# Patient Record
Sex: Female | Born: 1960 | Race: Black or African American | Hispanic: No
Health system: Southern US, Academic
[De-identification: ages and names within clinical notes are randomized; demographics above are authoritative.]

## PROBLEM LIST (undated history)

## (undated) ENCOUNTER — Encounter

## (undated) ENCOUNTER — Telehealth

## (undated) ENCOUNTER — Encounter: Attending: Nephrology | Primary: Nephrology

## (undated) ENCOUNTER — Ambulatory Visit

## (undated) ENCOUNTER — Ambulatory Visit: Attending: Women's Health | Primary: Women's Health

## (undated) ENCOUNTER — Encounter: Attending: Infectious Disease | Primary: Infectious Disease

## (undated) ENCOUNTER — Encounter: Attending: Internal Medicine | Primary: Internal Medicine

## (undated) ENCOUNTER — Ambulatory Visit: Payer: 59

## (undated) ENCOUNTER — Ambulatory Visit: Payer: 59 | Attending: Nephrology | Primary: Nephrology

## (undated) ENCOUNTER — Ambulatory Visit: Attending: Physician Assistant | Primary: Physician Assistant

## (undated) ENCOUNTER — Encounter
Payer: 59 | Attending: Pharmacist Clinician (PhC)/ Clinical Pharmacy Specialist | Primary: Pharmacist Clinician (PhC)/ Clinical Pharmacy Specialist

## (undated) ENCOUNTER — Encounter: Payer: 59 | Attending: Internal Medicine | Primary: Internal Medicine

## (undated) ENCOUNTER — Ambulatory Visit: Payer: 59 | Attending: Internal Medicine | Primary: Internal Medicine

## (undated) ENCOUNTER — Non-Acute Institutional Stay: Payer: 59

## (undated) ENCOUNTER — Telehealth: Attending: Internal Medicine | Primary: Internal Medicine

## (undated) ENCOUNTER — Ambulatory Visit: Attending: Family | Primary: Family

## (undated) ENCOUNTER — Encounter
Attending: Pharmacist Clinician (PhC)/ Clinical Pharmacy Specialist | Primary: Pharmacist Clinician (PhC)/ Clinical Pharmacy Specialist

## (undated) ENCOUNTER — Encounter: Attending: Physician Assistant | Primary: Physician Assistant

## (undated) ENCOUNTER — Encounter: Payer: 59 | Attending: Nephrology | Primary: Nephrology

## (undated) ENCOUNTER — Ambulatory Visit
Payer: 59 | Attending: Student in an Organized Health Care Education/Training Program | Primary: Student in an Organized Health Care Education/Training Program

## (undated) ENCOUNTER — Ambulatory Visit
Payer: MEDICARE | Attending: Student in an Organized Health Care Education/Training Program | Primary: Student in an Organized Health Care Education/Training Program

## (undated) ENCOUNTER — Ambulatory Visit: Attending: Adult Health | Primary: Adult Health

## (undated) DIAGNOSIS — I10 Essential (primary) hypertension: Secondary | ICD-10-CM

## (undated) DIAGNOSIS — K219 Gastro-esophageal reflux disease without esophagitis: Secondary | ICD-10-CM

## (undated) DIAGNOSIS — G43909 Migraine, unspecified, not intractable, without status migrainosus: Secondary | ICD-10-CM

## (undated) DIAGNOSIS — T7432XA Child psychological abuse, confirmed, initial encounter: Secondary | ICD-10-CM

## (undated) DIAGNOSIS — R112 Nausea with vomiting, unspecified: Secondary | ICD-10-CM

## (undated) DIAGNOSIS — Z9889 Other specified postprocedural states: Secondary | ICD-10-CM

## (undated) DIAGNOSIS — G4733 Obstructive sleep apnea (adult) (pediatric): Secondary | ICD-10-CM

## (undated) DIAGNOSIS — D631 Anemia in chronic kidney disease: Secondary | ICD-10-CM

## (undated) DIAGNOSIS — G479 Sleep disorder, unspecified: Secondary | ICD-10-CM

## (undated) DIAGNOSIS — B348 Other viral infections of unspecified site: Secondary | ICD-10-CM

## (undated) DIAGNOSIS — R490 Dysphonia: Secondary | ICD-10-CM

## (undated) DIAGNOSIS — I509 Heart failure, unspecified: Secondary | ICD-10-CM

## (undated) DIAGNOSIS — Z7960 Long term (current) use of unspecified immunomodulators and immunosuppressants: Secondary | ICD-10-CM

## (undated) DIAGNOSIS — J45909 Unspecified asthma, uncomplicated: Secondary | ICD-10-CM

## (undated) DIAGNOSIS — R600 Localized edema: Secondary | ICD-10-CM

## (undated) DIAGNOSIS — J449 Chronic obstructive pulmonary disease, unspecified: Secondary | ICD-10-CM

## (undated) DIAGNOSIS — Q613 Polycystic kidney, unspecified: Secondary | ICD-10-CM

## (undated) DIAGNOSIS — F419 Anxiety disorder, unspecified: Secondary | ICD-10-CM

## (undated) DIAGNOSIS — E119 Type 2 diabetes mellitus without complications: Secondary | ICD-10-CM

## (undated) DIAGNOSIS — N289 Disorder of kidney and ureter, unspecified: Secondary | ICD-10-CM

## (undated) DIAGNOSIS — I5030 Unspecified diastolic (congestive) heart failure: Secondary | ICD-10-CM

## (undated) DIAGNOSIS — B20 Human immunodeficiency virus [HIV] disease: Secondary | ICD-10-CM

## (undated) DIAGNOSIS — F1411 Cocaine abuse, in remission: Secondary | ICD-10-CM

## (undated) DIAGNOSIS — N186 End stage renal disease: Secondary | ICD-10-CM

## (undated) DIAGNOSIS — Z992 Dependence on renal dialysis: Secondary | ICD-10-CM

## (undated) DIAGNOSIS — K222 Esophageal obstruction: Secondary | ICD-10-CM

## (undated) DIAGNOSIS — Z62810 Personal history of physical and sexual abuse in childhood: Secondary | ICD-10-CM

## (undated) DIAGNOSIS — G629 Polyneuropathy, unspecified: Secondary | ICD-10-CM

## (undated) DIAGNOSIS — Z8616 Personal history of COVID-19: Secondary | ICD-10-CM

## (undated) DIAGNOSIS — G4730 Sleep apnea, unspecified: Secondary | ICD-10-CM

## (undated) DIAGNOSIS — J849 Interstitial pulmonary disease, unspecified: Secondary | ICD-10-CM

## (undated) DIAGNOSIS — F39 Unspecified mood [affective] disorder: Secondary | ICD-10-CM

## (undated) DIAGNOSIS — R7303 Prediabetes: Secondary | ICD-10-CM

## (undated) HISTORY — PX: APPENDECTOMY: SHX54

## (undated) HISTORY — PX: VEIN HARVEST: SHX6363

## (undated) HISTORY — PX: ABDOMINAL HYSTERECTOMY: SHX81

## (undated) HISTORY — PX: COLON SURGERY: SHX602

## (undated) HISTORY — PX: ABDOMINAL SURGERY: SHX537

## (undated) HISTORY — PX: ARTERIOVENOUS GRAFT PLACEMENT: SUR1029

## (undated) MED ORDER — MYCOPHENOLATE SODIUM 180 MG TABLET,DELAYED RELEASE: ORAL | 0 days

## (undated) MED ORDER — SODIUM BICARBONATE 325 MG TABLET: ORAL | 0 days

## (undated) MED ORDER — PREGABALIN 75 MG CAPSULE
ORAL | 0 days
Start: ? — End: 2020-04-08

## (undated) MED ORDER — TRAMADOL 50 MG TABLET: Freq: Four times a day (QID) | ORAL | 0.00000 days | PRN

---
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
## 2001-08-10 DIAGNOSIS — N186 End stage renal disease: Secondary | ICD-10-CM

## 2001-08-10 HISTORY — DX: Dependence on renal dialysis: N18.6

## 2013-09-21 ENCOUNTER — Ambulatory Visit: Payer: Self-pay | Admitting: Internal Medicine

## 2013-10-05 ENCOUNTER — Emergency Department: Payer: Self-pay | Admitting: Emergency Medicine

## 2013-11-19 ENCOUNTER — Emergency Department: Payer: Self-pay | Admitting: Emergency Medicine

## 2013-11-21 ENCOUNTER — Other Ambulatory Visit: Payer: Self-pay | Admitting: Nephrology

## 2013-11-21 LAB — POTASSIUM: POTASSIUM: 4.3 mmol/L (ref 3.5–5.1)

## 2013-11-28 ENCOUNTER — Ambulatory Visit: Payer: Self-pay | Admitting: Internal Medicine

## 2013-12-08 ENCOUNTER — Ambulatory Visit: Payer: Self-pay | Admitting: Anesthesiology

## 2013-12-09 ENCOUNTER — Emergency Department: Payer: Self-pay | Admitting: Emergency Medicine

## 2014-01-04 ENCOUNTER — Observation Stay: Payer: Self-pay | Admitting: Internal Medicine

## 2014-01-04 LAB — COMPREHENSIVE METABOLIC PANEL
ALBUMIN: 2.8 g/dL — AB (ref 3.4–5.0)
ALK PHOS: 78 U/L
Anion Gap: 3 — ABNORMAL LOW (ref 7–16)
BUN: 17 mg/dL (ref 7–18)
Bilirubin,Total: 0.2 mg/dL (ref 0.2–1.0)
CHLORIDE: 104 mmol/L (ref 98–107)
CO2: 31 mmol/L (ref 21–32)
CREATININE: 2.82 mg/dL — AB (ref 0.60–1.30)
Calcium, Total: 8.7 mg/dL (ref 8.5–10.1)
EGFR (Non-African Amer.): 18 — ABNORMAL LOW
GFR CALC AF AMER: 21 — AB
Glucose: 98 mg/dL (ref 65–99)
Osmolality: 277 (ref 275–301)
Potassium: 4.1 mmol/L (ref 3.5–5.1)
SGOT(AST): 29 U/L (ref 15–37)
SGPT (ALT): 16 U/L (ref 12–78)
Sodium: 138 mmol/L (ref 136–145)
Total Protein: 7.9 g/dL (ref 6.4–8.2)

## 2014-01-04 LAB — TROPONIN I
Troponin-I: <0.02 ng/mL
Troponin-I: <0.02 ng/mL
Troponin-I: <0.02 ng/mL

## 2014-01-04 LAB — CK TOTAL AND CKMB (NOT AT ARMC)
CK, TOTAL: 108 U/L
CK, Total: 113 U/L
CK, Total: 98 U/L
CK-MB: 0.6 ng/mL (ref 0.5–3.6)
CK-MB: <0.5 ng/mL — ABNORMAL LOW (ref 0.5–3.6)
CK-MB: 0.6 ng/mL (ref 0.5–3.6)

## 2014-01-04 LAB — CBC
HCT: 35 % (ref 35.0–47.0)
HGB: 11.3 g/dL — AB (ref 12.0–16.0)
MCH: 30.4 pg (ref 26.0–34.0)
MCHC: 32.3 g/dL (ref 32.0–36.0)
MCV: 94 fL (ref 80–100)
Platelet: 216 10*3/uL (ref 150–440)
RBC: 3.72 10*6/uL — AB (ref 3.80–5.20)
RDW: 16.6 % — AB (ref 11.5–14.5)
WBC: 8.7 10*3/uL (ref 3.6–11.0)

## 2014-01-05 DIAGNOSIS — I209 Angina pectoris, unspecified: Secondary | ICD-10-CM

## 2014-01-05 LAB — BASIC METABOLIC PANEL
ANION GAP: 4 — AB (ref 7–16)
BUN: 31 mg/dL — ABNORMAL HIGH (ref 7–18)
CO2: 27 mmol/L (ref 21–32)
Calcium, Total: 8.8 mg/dL (ref 8.5–10.1)
Chloride: 107 mmol/L (ref 98–107)
Creatinine: 4.13 mg/dL — ABNORMAL HIGH (ref 0.60–1.30)
GFR CALC AF AMER: 13 — AB
GFR CALC NON AF AMER: 11 — AB
Glucose: 96 mg/dL (ref 65–99)
OSMOLALITY: 282 (ref 275–301)
Potassium: 4.4 mmol/L (ref 3.5–5.1)
Sodium: 138 mmol/L (ref 136–145)

## 2014-03-08 ENCOUNTER — Ambulatory Visit: Payer: Self-pay | Admitting: Family Medicine

## 2014-06-18 ENCOUNTER — Emergency Department: Payer: Self-pay | Admitting: Emergency Medicine

## 2014-06-20 DIAGNOSIS — S82891A Other fracture of right lower leg, initial encounter for closed fracture: Secondary | ICD-10-CM | POA: Insufficient documentation

## 2014-06-20 DIAGNOSIS — S82892A Other fracture of left lower leg, initial encounter for closed fracture: Secondary | ICD-10-CM

## 2014-07-24 DIAGNOSIS — S82892D Other fracture of left lower leg, subsequent encounter for closed fracture with routine healing: Secondary | ICD-10-CM

## 2014-07-24 DIAGNOSIS — S82891D Other fracture of right lower leg, subsequent encounter for closed fracture with routine healing: Secondary | ICD-10-CM | POA: Insufficient documentation

## 2014-09-18 ENCOUNTER — Ambulatory Visit: Payer: Self-pay | Admitting: Internal Medicine

## 2014-12-01 NOTE — H&P (Signed)
PATIENT NAME:  Jasmine Holloway, Jasmine Holloway MR#:  [id number] DATE OF BIRTH:  May 25, 1961  DATE OF ADMISSION:  01/04/2014  ADMITTING PHYSICIAN: Gladstone Lighter, MD  PRIMARY PHYSICIAN: [facility].   CHIEF COMPLAINT: Chest pain.   HISTORY OF PRESENT ILLNESS: Jasmine Holloway is a 54 year old African-American female with past medical history significant for hypertension, hyperlipidemia, end-stage renal disease, on hemodialysis, who presents to the hospital secondary to chest pain that started during dialysis today. The patient said she was diagnosed with a rare form of polycystic kidney disease and has been on dialysis for 4 years now. She has not had prior chest pains. However, since her last 3 dialysis sessions, midway through dialysis, she experiences a heavy mid sternal pain associated with nausea. The patient states she had echocardiogram done about a couple of years ago while evaluating her for renal transplant, and at the time, she remembers that she could have had cardiomyopathy, but not sure if a stress test was done or not. She thinks she might have had a nuclear stress test which was negative at the same time as well. However, has not had chest pain at the time. She walks at home, she climbs stairs and does exertional activity without any recurrence of the pain. The pain usually starts midway through dialysis, starts with some nausea and feels like something catching in the throat and in the mid sternal region, associated with heavy pain. Denies any diaphoresis, radiation of the pain anywhere. She got a dose of nitro during dialysis for her chest pain, which did not improve the pain, and she got a second dose of nitro en route to the hospital, which relieved her pain. Does not have any prior cardiac history and no significant family history of heart problems noted. Her troponins are pending at this time.   PAST MEDICAL HISTORY:  1. End-stage renal disease, on dialysis for 4 years now, currently on  hemodialysis.  2. History of peritonitis infections in the past when she had peritoneal dialysis catheter.  3. Arthritis in the knees.  4. Chronic left hand pain from multiple graft surgeries.   PAST SURGICAL HISTORY:  1. Peritoneal ruptured abscess repair by laparotomy and extensive exploration.  2. Adhesiolysis twice later.  3. Peritoneal dialysis catheter placement.  4. Temporary dialysis catheter placement.  5. Multiple grafts for left arm and surgeries required.  6. Currently functioning graft on the right arm.   ALLERGIES TO MEDICATIONS: No known drug allergies.   CURRENT MEDICATIONS: The patient does not have a list of her medicines, but she can remember. As far as she can remember, she told me she takes the following: 1. A phosphate binder with meals.  2. Nephro-Vite 1 tablet p.o. daily.  3. Vitamin D2.  4. Crestor.  5. Omeprazole.  6. Percocet 10/325 mg 1 tablet q.8 hours p.r.n. for chronic left arm pain.   SOCIAL HISTORY: Used to live in [place]. Recently moved down to be with family to [place]. Currently lives by herself, has a fiance. No smoking or alcohol use.   FAMILY HISTORY: Dad lived up to late 59s and died of natural causes. Mom died in late 49s as well. She had COPD and CHF.   REVIEW OF SYSTEMS:  CONSTITUTIONAL: No fever, fatigue or weakness.  EYES: Positive for blurry vision. No inflammation or glaucoma.  ENT: No tinnitus, ear pain, hearing loss, epistaxis or discharge.  RESPIRATORY: No cough, wheezing, hemoptysis or COPD.  CARDIOVASCULAR: Positive for chest pain. No orthopnea, edema, arrhythmia, palpitations or  syncope.  GASTROINTESTINAL: Positive for nausea. No vomiting, diarrhea, abdominal pain, hematemesis or melena.  GENITOURINARY: Continues to make urine. No dysuria, hematuria, renal calculus or frequency.  ENDOCRINE: No polyuria, nocturia, thyroid problems, heat or cold intolerance.  HEMATOLOGY: No anemia, easy bruising or bleeding.  SKIN: No  acne, rash or lesions.  MUSCULOSKELETAL: Positive for arthritis, especially in the knees. No neck, back or shoulder pain.  NEUROLOGICAL: No numbness, weakness, CVA, TIA or seizures. Positive for headaches.  PSYCHOLOGICAL: No anxiety, insomnia or depression.   PHYSICAL EXAMINATION:  VITAL SIGNS: Temperature 98.5 degrees Fahrenheit, pulse 77, respirations 18, blood pressure 126/87, pulse oximetry 99% on room air.  GENERAL: Well-built, well-nourished female lying in bed, not in any acute distress.  HEENT: Normocephalic, atraumatic. Pupils equal, round and reacting to light. Anicteric sclerae. Extraocular movements intact. Oropharynx clear, without erythema, mass or exudates.  NECK: Supple. No thyromegaly, JVD or carotid bruits. No lymphadenopathy.  LUNGS: Moving air bilaterally. No wheeze or crackles. No use of accessory muscles for breathing.  CARDIOVASCULAR: S1, S2, regular rate and rhythm. No murmurs, rubs or gallops.  ABDOMEN: Soft, nontender, nondistended. No hepatosplenomegaly. Normal bowel sounds.  EXTREMITIES: No pedal edema. No clubbing or cyanosis, 2+ dorsalis pedis pulses palpable bilaterally.  SKIN: No acne, rash or lesions.  LYMPHATICS: No cervical lymphadenopathy.  NEUROLOGIC: Cranial nerves intact. No focal motor or sensory deficits.  PSYCHOLOGICAL: The patient is awake, alert, oriented x3.   LABORATORY DATA:  WBC 8.7, hemoglobin 11.3, hematocrit 35.0, platelet count 216.  Sodium 138, potassium 4.1, chloride 104, bicarbonate 31, BUN 17, creatinine 2.82, blood glucose of 98 and calcium of 8.7.  ALT 16, AST 29, alkaline phosphatase 98, total bilirubin 0.2 and albumin of 2.8.  Troponin less than 0.02.  Chest x-ray revealing mild interstitial edema. Heart is stable, mildly enlarged.  EKG showing normal sinus rhythm. No acute ST-T wave abnormalities.   ASSESSMENT AND PLAN: This is a 54 year old female with end-stage renal disease, on hemodialysis, brought in for chest pain.   1.  The chest pain during dialysis could be angina versus esophageal spasm with reflux problems. Will admit under observation. Continue to monitor troponins. Monitor on telemetry. Will order a stress test for tomorrow morning. Continue aspirin, statin and nitroglycerin p.r.n.  2. End-stage renal disease, on hemodialysis. Nephrology has been consulted. The patient on Tuesday, Thursday, Saturday schedule. She finished half of her dialysis today. She is not fluid overloaded for now.  3. Hyperlipidemia. Continue Crestor.  4. Chronic left arm pain from prior multiple graft surgeries. Continue Percocet.  5. Gastroesophageal reflux disease. The patient is on Protonix now.   CODE STATUS: Full code.   TIME SPENT ON ADMISSION: 50 minutes.   ____________________________ Gladstone Lighter, MD rk:lb D: 01/04/2014 13:00:00 ET T: 01/04/2014 13:39:29 ET JOB#: [id number]  cc: Gladstone Lighter, MD, <Dictator> Murlean Iba, MD Gladstone Lighter MD ELECTRONICALLY SIGNED 01/05/2014 12:35

## 2014-12-01 NOTE — Discharge Summary (Signed)
PATIENT NAME:  Jasmine Holloway, Jasmine Holloway MR#:  [id number] DATE OF BIRTH:  1961-02-16  DATE OF ADMISSION:  01/04/2014 DATE OF DISCHARGE:  01/05/2014  ADMISSION DIAGNOSIS:  Chest pain.   DISCHARGE DIAGNOSES:  1.  Chest pain. 2.  End-stage renal disease, on hemodialysis.   CONSULTATIONS:  Nephrology.   PERTINENT LABORATORY DATA:  Nuclear medicine scan is negative, showed a low risk scan.   HOSPITAL COURSE:  A 54 year old female who presented with chest pain during dialysis.  For further details, please refer to H and P.  1.  Chest pain, likely this is secondary to reflux.  It does not seem to be cardiac in nature.  Her troponins x 3 were negative.  Telemetry was essentially normal.  She underwent a Myoview stress test which was essentially negative.   2.  End-stage renal disease, on hemodialysis.  The patient with Tuesday, Thursday, Saturday schedule.  3.  Hyperlipidemia, on Crestor.  4.  Chronic left arm pain for multiple grafts.  The patient is on Percocet as needed.  5.  GERD.  The patient will continue on Protonix.   DISCHARGE MEDICATIONS: 1.  Gabapentin 300 mg daily.  2.  Percocet 5/325 q. 4 hours as needed.  3.  Gabapentin300 at bedtime.  4.  Lopressor 5 mg at bedtime.  5.  Nephro-Vite 1 tablet daily.  6.  Vitamin D2, 2 tablets daily.  7.  Calcium 667 mg 3 tablets three times a day.  8.  Aspirin 81 mg daily.   DISCHARGE DIET:  Low sodium.   DISCHARGE ACTIVITY:  As tolerated.   DISCHARGE FOLLOW-UP:  The patient will follow up with [facility] in one week and resume her hemodialysis tomorrow.    TIME SPENT:  35 minutes.  The patient is stable dor discharge.  ____________________________ Emaree Chiu P. Benjie Karvonen, MD spm:ea D: 01/05/2014 13:51:43 ET T: 01/05/2014 23:00:32 ET JOB#: [id number]  cc: Anely Spiewak P. Benjie Karvonen, MD, <Dictator> [place] P Brieonna Crutcher MD ELECTRONICALLY SIGNED 01/06/2014 7:53

## 2015-01-22 DIAGNOSIS — Z0289 Encounter for other administrative examinations: Secondary | ICD-10-CM | POA: Insufficient documentation

## 2015-02-23 ENCOUNTER — Observation Stay
Admission: EM | Admit: 2015-02-23 | Discharge: 2015-02-24 | Disposition: A | Discharge status: Home / Self Care | Payer: Medicare Other | Attending: Specialist | Admitting: Specialist

## 2015-02-23 DIAGNOSIS — I120 Hypertensive chronic kidney disease with stage 5 chronic kidney disease or end stage renal disease: Secondary | ICD-10-CM | POA: Insufficient documentation

## 2015-02-23 DIAGNOSIS — Z886 Allergy status to analgesic agent status: Secondary | ICD-10-CM | POA: Diagnosis not present

## 2015-02-23 DIAGNOSIS — M79602 Pain in left arm: Secondary | ICD-10-CM | POA: Diagnosis not present

## 2015-02-23 DIAGNOSIS — Z885 Allergy status to narcotic agent status: Secondary | ICD-10-CM | POA: Diagnosis not present

## 2015-02-23 DIAGNOSIS — Z8249 Family history of ischemic heart disease and other diseases of the circulatory system: Secondary | ICD-10-CM | POA: Insufficient documentation

## 2015-02-23 DIAGNOSIS — Z87891 Personal history of nicotine dependence: Secondary | ICD-10-CM | POA: Diagnosis not present

## 2015-02-23 DIAGNOSIS — R079 Chest pain, unspecified: Secondary | ICD-10-CM | POA: Diagnosis present

## 2015-02-23 DIAGNOSIS — Q613 Polycystic kidney, unspecified: Secondary | ICD-10-CM | POA: Diagnosis not present

## 2015-02-23 DIAGNOSIS — E785 Hyperlipidemia, unspecified: Secondary | ICD-10-CM | POA: Diagnosis not present

## 2015-02-23 DIAGNOSIS — N186 End stage renal disease: Secondary | ICD-10-CM | POA: Diagnosis not present

## 2015-02-23 DIAGNOSIS — R0789 Other chest pain: Secondary | ICD-10-CM | POA: Diagnosis not present

## 2015-02-23 DIAGNOSIS — Z992 Dependence on renal dialysis: Secondary | ICD-10-CM | POA: Insufficient documentation

## 2015-02-23 DIAGNOSIS — Z79899 Other long term (current) drug therapy: Secondary | ICD-10-CM | POA: Diagnosis not present

## 2015-02-23 DIAGNOSIS — N2581 Secondary hyperparathyroidism of renal origin: Secondary | ICD-10-CM | POA: Insufficient documentation

## 2015-02-23 HISTORY — DX: End stage renal disease: Z99.2

## 2015-02-23 HISTORY — DX: Polycystic kidney, unspecified: Q61.3

## 2015-02-23 HISTORY — DX: Essential (primary) hypertension: I10

## 2015-02-23 HISTORY — DX: End stage renal disease: N18.6

## 2015-02-24 ENCOUNTER — Encounter: Payer: Self-pay | Admitting: Emergency Medicine

## 2015-02-24 ENCOUNTER — Emergency Department: Payer: Medicare Other

## 2015-02-24 DIAGNOSIS — R079 Chest pain, unspecified: Secondary | ICD-10-CM | POA: Diagnosis present

## 2015-02-24 DIAGNOSIS — R0789 Other chest pain: Secondary | ICD-10-CM | POA: Diagnosis not present

## 2015-02-24 LAB — BASIC METABOLIC PANEL
Anion gap: 9 (ref 5–15)
BUN: 38 mg/dL — ABNORMAL HIGH (ref 6–20)
CHLORIDE: 105 mmol/L (ref 101–111)
CO2: 27 mmol/L (ref 22–32)
Calcium: 7.7 mg/dL — ABNORMAL LOW (ref 8.9–10.3)
Creatinine, Ser: 5.55 mg/dL — ABNORMAL HIGH (ref 0.44–1.00)
GFR calc Af Amer: 9 mL/min — ABNORMAL LOW (ref >60)
GFR calc non Af Amer: 8 mL/min — ABNORMAL LOW (ref >60)
Glucose, Bld: 103 mg/dL — ABNORMAL HIGH (ref 65–99)
POTASSIUM: 4 mmol/L (ref 3.5–5.1)
SODIUM: 141 mmol/L (ref 135–145)

## 2015-02-24 LAB — TROPONIN I
Troponin I: <0.03 ng/mL (ref <0.031)
Troponin I: <0.03 ng/mL (ref <0.031)

## 2015-02-24 LAB — CBC
HCT: 31.4 % — ABNORMAL LOW (ref 35.0–47.0)
Hemoglobin: 10.4 g/dL — ABNORMAL LOW (ref 12.0–16.0)
MCH: 30.8 pg (ref 26.0–34.0)
MCHC: 33.1 g/dL (ref 32.0–36.0)
MCV: 93.2 fL (ref 80.0–100.0)
PLATELETS: 206 10*3/uL (ref 150–440)
RBC: 3.37 MIL/uL — ABNORMAL LOW (ref 3.80–5.20)
RDW: 16.4 % — AB (ref 11.5–14.5)
WBC: 7.4 10*3/uL (ref 3.6–11.0)

## 2015-02-24 LAB — HEMOGLOBIN A1C: Hgb A1c MFr Bld: 5.3 % (ref 4.0–6.0)

## 2015-02-24 LAB — TSH: TSH: 1.764 u[IU]/mL (ref 0.350–4.500)

## 2015-02-24 MED ORDER — CALCIUM ACETATE (PHOS BINDER) 667 MG PO CAPS
667.0000 mg | ORAL_CAPSULE | Freq: Three times a day (TID) | ORAL | Status: DC
  Administered 2015-02-24 (×2): 667 mg via ORAL
  Filled 2015-02-24 (×2): qty 1

## 2015-02-24 MED ORDER — ASPIRIN 81 MG PO CHEW
324.0000 mg | CHEWABLE_TABLET | Freq: Once | ORAL | Status: AC
  Administered 2015-02-24: 324 mg via ORAL
  Filled 2015-02-24: qty 4

## 2015-02-24 MED ORDER — GABAPENTIN 300 MG PO CAPS
300.0000 mg | ORAL_CAPSULE | Freq: Three times a day (TID) | ORAL | Status: DC
  Administered 2015-02-24: 300 mg via ORAL
  Filled 2015-02-24: qty 1

## 2015-02-24 MED ORDER — RENA-VITE PO TABS
1.0000 | ORAL_TABLET | Freq: Every day | ORAL | Status: DC
  Filled 2015-02-24: qty 1

## 2015-02-24 MED ORDER — SODIUM CHLORIDE 0.9 % IJ SOLN
3.0000 mL | Freq: Two times a day (BID) | INTRAMUSCULAR | Status: DC
  Administered 2015-02-24: 3 mL via INTRAVENOUS

## 2015-02-24 MED ORDER — NITROGLYCERIN 0.4 MG SL SUBL: 0.4000 mg | SUBLINGUAL_TABLET | SUBLINGUAL | Status: DC | PRN

## 2015-02-24 MED ORDER — ROSUVASTATIN CALCIUM 10 MG PO TABS
5.0000 mg | ORAL_TABLET | Freq: Every day | ORAL | Status: DC
  Administered 2015-02-24: 5 mg via ORAL
  Filled 2015-02-24: qty 1

## 2015-02-24 MED ORDER — CINACALCET HCL 30 MG PO TABS
30.0000 mg | ORAL_TABLET | Freq: Every day | ORAL | Status: DC
  Administered 2015-02-24: 30 mg via ORAL
  Filled 2015-02-24: qty 1

## 2015-02-24 MED ORDER — ASPIRIN EC 81 MG PO TBEC
81.0000 mg | DELAYED_RELEASE_TABLET | Freq: Every day | ORAL | Status: DC
  Administered 2015-02-24: 81 mg via ORAL
  Filled 2015-02-24: qty 1

## 2015-02-24 MED ORDER — ONDANSETRON HCL 4 MG/2ML IJ SOLN: 4.0000 mg | Freq: Four times a day (QID) | INTRAMUSCULAR | Status: DC | PRN

## 2015-02-24 MED ORDER — NITROGLYCERIN 2 % TD OINT
1.0000 [in_us] | TOPICAL_OINTMENT | Freq: Once | TRANSDERMAL | Status: AC
  Administered 2015-02-24: 1 [in_us] via TOPICAL
  Filled 2015-02-24: qty 1

## 2015-02-24 MED ORDER — HEPARIN SODIUM (PORCINE) 5000 UNIT/ML IJ SOLN
5000.0000 [IU] | Freq: Three times a day (TID) | INTRAMUSCULAR | Status: DC
  Filled 2015-02-24: qty 1

## 2015-02-24 MED ORDER — ONDANSETRON HCL 4 MG PO TABS: 4.0000 mg | ORAL_TABLET | Freq: Four times a day (QID) | ORAL | Status: DC | PRN

## 2015-02-24 MED ORDER — ACETAMINOPHEN 325 MG PO TABS
650.0000 mg | ORAL_TABLET | Freq: Four times a day (QID) | ORAL | Status: DC | PRN
  Administered 2015-02-24: 650 mg via ORAL
  Filled 2015-02-24: qty 2

## 2015-02-24 MED ORDER — PANTOPRAZOLE SODIUM 40 MG PO TBEC
40.0000 mg | DELAYED_RELEASE_TABLET | Freq: Every day | ORAL | Status: DC
  Administered 2015-02-24: 40 mg via ORAL
  Filled 2015-02-24: qty 1

## 2015-02-24 MED ORDER — DOCUSATE SODIUM 100 MG PO CAPS
100.0000 mg | ORAL_CAPSULE | Freq: Two times a day (BID) | ORAL | Status: DC
  Administered 2015-02-24: 100 mg via ORAL
  Filled 2015-02-24: qty 1

## 2015-02-24 MED ORDER — ACETAMINOPHEN 325 MG PO TABS: 650.0000 mg | ORAL_TABLET | Freq: Four times a day (QID) | ORAL | Status: DC | PRN

## 2015-02-24 MED ORDER — METOPROLOL SUCCINATE ER 50 MG PO TB24
50.0000 mg | ORAL_TABLET | Freq: Every day | ORAL | Status: DC
Start: 2015-02-24 — End: 2015-02-24
  Administered 2015-02-24: 50 mg via ORAL
  Filled 2015-02-24: qty 1

## 2015-02-24 NOTE — Progress Notes (Signed)
Skin assessment completed with Delrae Rend, RN

## 2015-02-24 NOTE — Discharge Instructions (Signed)
°  DIET:  Renal diet  DISCHARGE CONDITION:  Stable  ACTIVITY:  Activity as tolerated  OXYGEN:  Home Oxygen: No.   Oxygen Delivery: room air  DISCHARGE LOCATION:  home   If you experience worsening of your admission symptoms, develop shortness of breath, life threatening emergency, suicidal or homicidal thoughts you must seek medical attention immediately by calling 911 or calling your MD immediately  if symptoms less severe.  You Must read complete instructions/literature along with all the possible adverse reactions/side effects for all the Medicines you take and that have been prescribed to you. Take any new Medicines after you have completely understood and accpet all the possible adverse reactions/side effects.   Please note  You were cared for by a hospitalist during your hospital stay. If you have any questions about your discharge medications or the care you received while you were in the hospital after you are discharged, you can call the unit and asked to speak with the hospitalist on call if the hospitalist that took care of you is not available. Once you are discharged, your primary care physician will handle any further medical issues. Please note that NO REFILLS for any discharge medications will be authorized once you are discharged, as it is imperative that you return to your primary care physician (or establish a relationship with a primary care physician if you do not have one) for your aftercare needs so that they can reassess your need for medications and monitor your lab values.

## 2015-02-24 NOTE — Progress Notes (Signed)
Discharge instructions explained to pt/ verbalized an understanding/ iv and tele removed/ taxi voucher provided for transportation home/ will transport off unit via wheelchair.

## 2015-02-24 NOTE — ED Notes (Signed)
Pt presents to ED via EMS with c/ of chest pain. EMS states pt experienced presenting sx today since 2300. EMS states pt describes pain as crushing, heavy located in central chest area radiating to left shoulder/tips of left side fingers. Pt arrived to ER room alert and oriented x4. Pt states pain has decreased to a 3/10 since transport to ER.

## 2015-02-24 NOTE — Progress Notes (Signed)
Patient resting in bed at this time. No C/O of chest pain at this time

## 2015-02-24 NOTE — H&P (Signed)
Jasmine Holloway is an 54 y.o. female.   Chief Complaint: Chest pain HPI: Patient presented to the hospital via EMS complaining of chest pain. The pain occurred while she was bending over to put on her pants. She describes the pain as crushing in character and 10 out of 10 in severity. She states the pain is in the middle of her chest and radiates to her left breast and down her left arm to her hand. She states that the pain also radiates into the right side of her neck and she complains the right side of her cheek feels like it is cramping. Following aspirin administration and nitroglycerin ointment placement her pain is currently 4 out of 10 in severity. She admits that it has been associated with shortness of breath and some nausea that she admits is worse then the nausea she usually experiences after dialysis. She has not missed any dialysis treatments. She has had pain like this before when she was starting the process of treatment for chronic kidney disease. The emergency department staff called for admission due to ongoing chest pain.  Past Medical History  Diagnosis Date  . ESRD (end stage renal disease) on dialysis   . Hypertension   . Polycystic kidney disease     Past Surgical History  Procedure Laterality Date  . Arteriovenous graft placement Right     x3 (R forearm currently used for access)  . Vein harvest      Family History  Problem Relation Age of Onset  . Hypertension Brother    Social History:  reports that she has quit smoking. She does not have any smokeless tobacco history on file. She reports that she does not drink alcohol or use illicit drugs.  Allergies:  Allergies  Allergen Reactions  . Morphine And Related Nausea And Vomiting  . Vicodin [Hydrocodone-Acetaminophen] Nausea And Vomiting    Medications Prior to Admission  Medication Sig Dispense Refill  . b complex-vitamin c-folic acid (NEPHRO-VITE) 0.8 MG TABS tablet Take 1 tablet by mouth at bedtime.    .  calcium acetate (PHOSLO) 667 MG capsule Take by mouth 3 (three) times daily with meals.    . cinacalcet (SENSIPAR) 30 MG tablet Take 30 mg by mouth daily.    Marland Kitchen gabapentin (NEURONTIN) 300 MG capsule Take 300 mg by mouth 3 (three) times daily.    . metoprolol succinate (TOPROL-XL) 50 MG 24 hr tablet Take 50 mg by mouth daily. Take with or immediately following a meal.    . omeprazole (PRILOSEC) 20 MG capsule Take 20 mg by mouth daily.    . rosuvastatin (CRESTOR) 5 MG tablet Take 5 mg by mouth daily.      Results for orders placed or performed during the hospital encounter of 02/23/15 (from the past 48 hour(s))  CBC     Status: Abnormal   Collection Time: 02/24/15 12:24 AM  Result Value Ref Range   WBC 7.4 3.6 - 11.0 K/uL   RBC 3.37 (L) 3.80 - 5.20 MIL/uL   Hemoglobin 10.4 (L) 12.0 - 16.0 g/dL   HCT 52.4 (L) 15.9 - 01.7 %   MCV 93.2 80.0 - 100.0 fL   MCH 30.8 26.0 - 34.0 pg   MCHC 33.1 32.0 - 36.0 g/dL   RDW 24.1 (H) 95.4 - 24.8 %   Platelets 206 150 - 440 K/uL  Basic metabolic panel     Status: Abnormal   Collection Time: 02/24/15  1:19 AM  Result Value Ref Range  Sodium 141 135 - 145 mmol/L   Potassium 4.0 3.5 - 5.1 mmol/L   Chloride 105 101 - 111 mmol/L   CO2 27 22 - 32 mmol/L   Glucose, Bld 103 (H) 65 - 99 mg/dL   BUN 38 (H) 6 - 20 mg/dL   Creatinine, Ser 5.55 (H) 0.44 - 1.00 mg/dL   Calcium 7.7 (L) 8.9 - 10.3 mg/dL   GFR calc non Af Amer 8 (L) >60 mL/min   GFR calc Af Amer 9 (L) >60 mL/min    Comment: (NOTE) The eGFR has been calculated using the CKD EPI equation. This calculation has not been validated in all clinical situations. eGFR's persistently <60 mL/min signify possible Chronic Kidney Disease.    Anion gap 9 5 - 15  Troponin I     Status: None   Collection Time: 02/24/15  1:19 AM  Result Value Ref Range   Troponin I <0.03 <0.031 ng/mL    Comment:        NO INDICATION OF MYOCARDIAL INJURY.   TSH     Status: None   Collection Time: 02/24/15  5:21 AM  Result  Value Ref Range   TSH 1.764 0.350 - 4.500 uIU/mL  Troponin I (q 6hr x 3)     Status: None   Collection Time: 02/24/15  5:21 AM  Result Value Ref Range   Troponin I <0.03 <0.031 ng/mL    Comment:        NO INDICATION OF MYOCARDIAL INJURY.    Dg Chest Port 1 View  02/24/2015   CLINICAL DATA:  Chest pain since 20/3 100 hours. Crushing central chest pain radiating to the left shoulder.  EXAM: PORTABLE CHEST - 1 VIEW  COMPARISON:  01/04/2014  FINDINGS: Mild cardiac enlargement without significant vascular congestion. Probable emphysematous changes in the upper lungs. No focal airspace disease or consolidation. No blunting of costophrenic angles. No pneumothorax. Surgical clips in the left axilla.  IMPRESSION: Cardiac enlargement.  No evidence of active pulmonary disease.   Electronically Signed   By: Lucienne Capers M.D.   On: 02/24/2015 01:39    Review of Systems  Constitutional: Negative for fever and chills.  HENT: Negative for sore throat and tinnitus.   Eyes: Negative for blurred vision and redness.  Respiratory: Positive for shortness of breath. Negative for cough.   Cardiovascular: Positive for chest pain. Negative for palpitations, orthopnea and PND.  Gastrointestinal: Negative for nausea, vomiting, abdominal pain and diarrhea.  Genitourinary: Negative for dysuria, urgency and frequency.  Musculoskeletal: Negative for myalgias and joint pain.  Skin: Negative for rash.       No lesions  Neurological: Positive for sensory change (Right face). Negative for speech change, focal weakness and weakness.  Endo/Heme/Allergies: Does not bruise/bleed easily.       No temperature intolerance  Psychiatric/Behavioral: Negative for depression and suicidal ideas.    Blood pressure 123/73, pulse 73, temperature 98.1 F (36.7 C), temperature source Oral, resp. rate 16, height $RemoveBe'5\' 3"'TXdJSEXid$  (1.6 m), weight 65.227 kg (143 lb 12.8 oz), SpO2 96 %. Physical Exam  Vitals reviewed. Constitutional: She is  oriented to person, place, and time. She appears well-developed and well-nourished.  HENT:  Head: Normocephalic and atraumatic.  Mouth/Throat: Oropharynx is clear and moist.  Eyes: Conjunctivae and EOM are normal. Pupils are equal, round, and reactive to light. No scleral icterus.  Neck: Normal range of motion. Neck supple. No JVD present. No tracheal deviation present. No thyromegaly present.  Cardiovascular: Normal rate, regular  rhythm and normal heart sounds.  Exam reveals no gallop and no friction rub.   No murmur heard. Respiratory: Effort normal and breath sounds normal. No respiratory distress.  GI: Soft. Bowel sounds are normal. She exhibits no distension. There is no tenderness.  Genitourinary:  Deferred  Musculoskeletal: Normal range of motion. She exhibits no edema.  AV graft in place right forearm  Lymphadenopathy:    She has no cervical adenopathy.  Neurological: She is alert and oriented to person, place, and time. No cranial nerve deficit. She exhibits normal muscle tone.  Skin: Skin is warm and dry.  Psychiatric: She has a normal mood and affect. Her behavior is normal. Judgment and thought content normal.     Assessment/Plan This is a 54 year old African American female admitted for chest pain. 1. Chest pain: Unclear etiology; originally suggestive of musculoskeletal pain, although it has improved with nitroglycerin suggesting that she may have some demand ischemia. Her potassium is normal at this time and her EKG does not show any signs of acute ischemia. 2 sets of troponin have been negative. Continue to follow cardiac enzymes. Cardiology consult at the discretion of the primary care team. 2. ESRD: Dialysis Monday Wednesday and Friday. No rubs on physical exam. Chest pain is not positional. Potassium within normal limits. The patient does not need dialysis at this time. Continue Sensipar and phosphate binders 3. Hypertension: Continue metoprolol 4. DVT prophylaxis:  Heparin 5. GI prophylaxis: Pantoprazole due to unclear nature of chest pain The patient is a full code. Time spent on admission orders and patient care approximately 35 minutes  Harrie Foreman 02/24/2015, 7:31 AM

## 2015-02-24 NOTE — Care Management Note (Signed)
Case Management Note  Patient Details  Name: Jasmine Holloway MRN: [id number] Date of Birth: February 08, 1961  Subjective/Objective:  Medicare observation letter reviewed with patient, verbalized understanding, signed, copy given, original placed on chart.                  Action/Plan:   Expected Discharge Date:                  Expected Discharge Plan:     In-House Referral:     Discharge planning Services     Post Acute Care Choice:    Choice offered to:     DME Arranged:    DME Agency:     HH Arranged:    [place] Agency:     Status of Service:     Medicare Important Message Given:    Date Medicare IM Given:    Medicare IM give by:    Date Additional Medicare IM Given:    Additional Medicare Important Message give by:     If discussed at [place] of Stay Meetings, dates discussed:    Additional Comments:  Ival Bible, RN 02/24/2015, 1:42 PM

## 2015-02-24 NOTE — ED Provider Notes (Signed)
[facility] Emergency Department Provider Note  ____________________________________________  Time seen: Approximately 1:13 AM  I have reviewed the triage vital signs and the nursing notes.   HISTORY  Chief Complaint Chest Pain    HPI Jasmine Holloway is a 54 y.o. female who presents to the ED via EMS for chest pain. Patient was a wakened with a phone call approximately 11 PM to give a friend a ride. While putting on her clothes, patient experienced crushing type chest pain in her left chest radiating to her left arm. Symptoms were associated with shortness of breath. Patient denies diaphoresis, nausea or vomiting. Patient states she has not "felt well" all week. She has a history of renal failure on hemodialysis; last dialyzed on Friday. States she has been experiencing muscle cramps and twitching especially in the right side of her face.Denies fever, chills, cough, diarrhea, dysuria. Complains of generalized weakness.   Past medical history ESRD on HD M/W/F Hypertension   Patient Active Problem List   Diagnosis Date Noted  . Chest pain 02/24/2015    Past surgical history AV fistula  No current outpatient prescriptions on file.  Allergies Morphine and related and Vicodin  No family history on file.  Social History History  Substance Use Topics  . Smoking status: Former Research scientist (life sciences)  . Smokeless tobacco: Not on file  . Alcohol Use: No    Review of Systems Constitutional: No fever/chills Eyes: No visual changes. ENT: No sore throat. Cardiovascular: Positive for chest pain. Respiratory: Positive for shortness of breath. Gastrointestinal: No abdominal pain.  No nausea, no vomiting.  No diarrhea.  No constipation. Genitourinary: Still urinates. Negative for dysuria. Musculoskeletal: Negative for back pain. Skin: Negative for rash. Neurological: Negative for headaches, focal weakness or numbness.  10-point ROS otherwise  negative.  ____________________________________________   PHYSICAL EXAM:  VITAL SIGNS: ED Triage Vitals  Enc Vitals Group     BP 02/23/15 2357 145/91 mmHg     Pulse Rate 02/23/15 2357 76     Resp 02/23/15 2357 21     Temp 02/23/15 2357 98.1 F (36.7 C)     Temp Source 02/23/15 2357 Oral     SpO2 02/23/15 2357 98 %     Weight 02/24/15 0004 180 lb (81.647 kg)     Height 02/24/15 0004 5\' 3"  (1.6 m)     Head Cir --      Peak Flow --      Pain Score 02/24/15 0001 3     Pain Loc --      Pain Edu? --      Excl. in [place]? --     Constitutional: Alert and oriented. Well appearing and in no acute distress. Eyes: Conjunctivae are normal. PERRL. EOMI. Head: Atraumatic. Nose: No congestion/rhinnorhea. Mouth/Throat: Mucous membranes are moist.  Oropharynx non-erythematous. Neck: No stridor.   Cardiovascular: Normal rate, regular rhythm. Grossly normal heart sounds.  Good peripheral circulation. Respiratory: Normal respiratory effort.  No retractions. Lungs CTAB. Gastrointestinal: Soft and nontender. No distention. No abdominal bruits. No CVA tenderness. Musculoskeletal: LUE AV fistula with palpable thrill. No lower extremity tenderness nor edema.  No joint effusions. Neurologic:  Normal speech and language. No gross focal neurologic deficits are appreciated.  Skin:  Skin is warm, dry and intact. No rash noted. Psychiatric: Mood and affect are normal. Speech and behavior are normal.  ____________________________________________   LABS (all labs ordered are listed, but only abnormal results are displayed)  Labs Reviewed  CBC - Abnormal; Notable for  the following:    RBC 3.37 (*)    Hemoglobin 10.4 (*)    HCT 31.4 (*)    RDW 16.4 (*)    All other components within normal limits  BASIC METABOLIC PANEL - Abnormal; Notable for the following:    Glucose, Bld 103 (*)    BUN 38 (*)    Creatinine, Ser 5.55 (*)    Calcium 7.7 (*)    GFR calc non Af Amer 8 (*)    GFR calc Af Amer 9 (*)     All other components within normal limits  TROPONIN I  TROPONIN I  TROPONIN I   ____________________________________________  EKG  ED ECG REPORT I, SUNG,JADE J, the attending physician, personally viewed and interpreted this ECG.   Date: 02/24/2015  EKG Time: 0012  Rate: 68  Rhythm: normal EKG, normal sinus rhythm  Axis: Normal  Intervals:none  ST&T Change: Nonspecific  ____________________________________________  RADIOLOGY  Portable chest x-ray (viewed by me, interpreted per Dr. Gerilyn Nestle): Cardiac enlargement. No evidence of active pulmonary disease. ____________________________________________   PROCEDURES  Procedure(s) performed: None  Critical Care performed: No  ____________________________________________   INITIAL IMPRESSION / ASSESSMENT AND PLAN / ED COURSE  Pertinent labs & imaging results that were available during my care of the patient were reviewed by me and considered in my medical decision making (see chart for details).  54 year old female with a history of ESRD on hemodialysis presenting with left-sided chest pressure associated with shortness of breath and generalized malaise. Will check screening lab work including troponin; start aspirin and nitroglycerin.  ----------------------------------------- 3:09 AM on 02/24/2015 -----------------------------------------  Patient is chest pain free after nitroglycerin paste and is currently asleep. Discussed case with hospitalist who will evaluate patient in the ED for admission. ____________________________________________   FINAL CLINICAL IMPRESSION(S) / ED DIAGNOSES  Final diagnoses:  Chest pain, unspecified chest pain type  Hypocalcemia      Paulette Blanch, MD 02/24/15 [phone]

## 2015-02-24 NOTE — Discharge Summary (Signed)
[place] [facility] [place] NAME: Jasmine Holloway    MR#:  [id number]  DATE OF BIRTH:  1961/02/09  DATE OF ADMISSION:  02/23/2015 ADMITTING PHYSICIAN: Harrie Foreman, MD  DATE OF DISCHARGE: 02/24/2015  PRIMARY CARE PHYSICIAN: Theotis Burrow, MD    ADMISSION DIAGNOSIS:  Hypocalcemia [E83.51] Chest pain, unspecified chest pain type [R07.9]  DISCHARGE DIAGNOSIS:  Active Problems:   Chest pain   SECONDARY DIAGNOSIS:   Past Medical History  Diagnosis Date  . ESRD (end stage renal disease) on dialysis   . Hypertension   . Polycystic kidney disease     HOSPITAL COURSE:   54 year old female with past medical history of end-stage renal disease on hemodialysis, hypertension, hyperlipidemia, secondary hyperparathyroidism, who presented to the hospital due to chest pain.   #1 chest pain-the most likely cause of patient's chest pain is likely musculoskeletal in nature. -Patient was observed overnight on telemetry and had 3 sets of cardiac markers checked which were negative. Her pain is more reproducible this was likely musculoskeletal in nature. -She is clinically doing well and therefore being discharged home. She does have risk factors for coronary disease given her history of end-stage renal disease therefore it was recommended that she see her primary care physician for an outpatient stress test.  #2 end-stage renal disease on hemodialysis-patient had no acute evidence of volume overload, hyperkalemia or any acute indication for dialysis in the hospital. She will resume her scheduled for hemodialysis on Monday, Wednesday, Friday as outpatient.  #3 hypertension-patient remained hemodynamically stable and she will continue her metoprolol upon discharge.  #4 secondary hyperparathyroidism-she will continue her Sensipar, PhosLo.  #5 hyperlipidemia-patient will continue her Crestor.   DISCHARGE CONDITIONS:   Stable  CONSULTS  OBTAINED:     DRUG ALLERGIES:   Allergies  Allergen Reactions  . Morphine And Related Nausea And Vomiting  . Vicodin [Hydrocodone-Acetaminophen] Nausea And Vomiting    DISCHARGE MEDICATIONS:   Current Discharge Medication List    CONTINUE these medications which have NOT CHANGED   Details  b complex-vitamin c-folic acid (NEPHRO-VITE) 0.8 MG TABS tablet Take 1 tablet by mouth at bedtime.    calcium acetate (PHOSLO) 667 MG capsule Take by mouth 3 (three) times daily with meals.    cinacalcet (SENSIPAR) 30 MG tablet Take 30 mg by mouth daily.    gabapentin (NEURONTIN) 300 MG capsule Take 300 mg by mouth 3 (three) times daily.    metoprolol succinate (TOPROL-XL) 50 MG 24 hr tablet Take 50 mg by mouth daily. Take with or immediately following a meal.    omeprazole (PRILOSEC) 20 MG capsule Take 20 mg by mouth daily.    rosuvastatin (CRESTOR) 5 MG tablet Take 5 mg by mouth daily.         DISCHARGE INSTRUCTIONS:   DIET:  Cardiac diet and Renal diet  DISCHARGE CONDITION:  Stable  ACTIVITY:  Activity as tolerated  OXYGEN:  Home Oxygen: No.   Oxygen Delivery: room air  DISCHARGE LOCATION:  home   If you experience worsening of your admission symptoms, develop shortness of breath, life threatening emergency, suicidal or homicidal thoughts you must seek medical attention immediately by calling 911 or calling your MD immediately  if symptoms less severe.  You Must read complete instructions/literature along with all the possible adverse reactions/side effects for all the Medicines you take and that have been prescribed to you. Take any new Medicines after you have completely understood and accpet all the  possible adverse reactions/side effects.   Please note  You were cared for by a hospitalist during your hospital stay. If you have any questions about your discharge medications or the care you received while you were in the hospital after you are discharged, you can  call the unit and asked to speak with the hospitalist on call if the hospitalist that took care of you is not available. Once you are discharged, your primary care physician will handle any further medical issues. Please note that NO REFILLS for any discharge medications will be authorized once you are discharged, as it is imperative that you return to your primary care physician (or establish a relationship with a primary care physician if you do not have one) for your aftercare needs so that they can reassess your need for medications and monitor your lab values.     Today   Patient is chest pain-free and hemodynamically stable.  VITAL SIGNS:  Blood pressure 129/76, pulse 72, temperature 98.1 F (36.7 C), temperature source Oral, resp. rate 19, height 5\' 3"  (1.6 m), weight 65.227 kg (143 lb 12.8 oz), SpO2 97 %.  I/O:   Intake/Output Summary (Last 24 hours) at 02/24/15 1235 Last data filed at 02/24/15 0830  Gross per 24 hour  Intake      0 ml  Output      0 ml  Net      0 ml    PHYSICAL EXAMINATION:  GENERAL:  54 y.o.-year-old patient lying in the bed with no acute distress.  EYES: Pupils equal, round, reactive to light and accommodation. No scleral icterus. Extraocular muscles intact.  HEENT: Head atraumatic, normocephalic. Oropharynx and nasopharynx clear.  NECK:  Supple, no jugular venous distention. No thyroid enlargement, no tenderness.  LUNGS: Normal breath sounds bilaterally, no wheezing, rales,rhonchi. No use of accessory muscles of respiration.  CARDIOVASCULAR: S1, S2 normal. No murmurs, rubs, or gallops.  ABDOMEN: Soft, non-tender, non-distended. Bowel sounds present. No organomegaly or mass.  EXTREMITIES: No pedal edema, cyanosis, or clubbing.  NEUROLOGIC: Cranial nerves II through XII are intact. No focal motor or sensory defecits b/l.  PSYCHIATRIC: The patient is alert and oriented x 3. Good affect.  SKIN: No obvious rash, lesion, or ulcer.   Right upper extremity AV  fistula with good bruit and good thrill.  DATA REVIEW:   CBC  Recent Labs Lab 02/24/15 0024  WBC 7.4  HGB 10.4*  HCT 31.4*  PLT 206    Chemistries   Recent Labs Lab 02/24/15 0119  NA 141  K 4.0  CL 105  CO2 27  GLUCOSE 103*  BUN 38*  CREATININE 5.55*  CALCIUM 7.7*    Cardiac Enzymes  Recent Labs Lab 02/24/15 0933  TROPONINI <0.03    Microbiology Results  No results found for this or any previous visit.  RADIOLOGY:  Dg Chest Port 1 View  02/24/2015   CLINICAL DATA:  Chest pain since 20/3 100 hours. Crushing central chest pain radiating to the left shoulder.  EXAM: PORTABLE CHEST - 1 VIEW  COMPARISON:  01/04/2014  FINDINGS: Mild cardiac enlargement without significant vascular congestion. Probable emphysematous changes in the upper lungs. No focal airspace disease or consolidation. No blunting of costophrenic angles. No pneumothorax. Surgical clips in the left axilla.  IMPRESSION: Cardiac enlargement.  No evidence of active pulmonary disease.   Electronically Signed   By: Lucienne Capers M.D.   On: 02/24/2015 01:39      Management plans discussed with the patient, family  and they are in agreement.  CODE STATUS:     Code Status Orders        Start     Ordered   02/24/15 0352  Full code   Continuous     02/24/15 0351      TOTAL TIME TAKING CARE OF THIS PATIENT: 40 minutes.    Henreitta Leber M.D on 02/24/2015 at 12:35 PM  Between 7am to 6pm - Pager - [phone]  After 6pm go to [email or web address] - password [email or web address] [facility]  Office  [phone]  CC: Primary care physician; Theotis Burrow, MD

## 2015-05-03 ENCOUNTER — Encounter: Payer: Self-pay | Admitting: Emergency Medicine

## 2015-05-03 ENCOUNTER — Emergency Department: Payer: Medicare Other

## 2015-05-03 ENCOUNTER — Emergency Department
Admission: EM | Admit: 2015-05-03 | Discharge: 2015-05-04 | Disposition: A | Discharge status: Home / Self Care | Payer: Medicare Other | Attending: Emergency Medicine | Admitting: Emergency Medicine

## 2015-05-03 DIAGNOSIS — R51 Headache: Secondary | ICD-10-CM | POA: Insufficient documentation

## 2015-05-03 DIAGNOSIS — R252 Cramp and spasm: Secondary | ICD-10-CM | POA: Diagnosis not present

## 2015-05-03 DIAGNOSIS — M549 Dorsalgia, unspecified: Secondary | ICD-10-CM | POA: Diagnosis not present

## 2015-05-03 DIAGNOSIS — Z87891 Personal history of nicotine dependence: Secondary | ICD-10-CM | POA: Insufficient documentation

## 2015-05-03 DIAGNOSIS — R110 Nausea: Secondary | ICD-10-CM | POA: Insufficient documentation

## 2015-05-03 DIAGNOSIS — I10 Essential (primary) hypertension: Secondary | ICD-10-CM | POA: Diagnosis not present

## 2015-05-03 DIAGNOSIS — R8299 Other abnormal findings in urine: Secondary | ICD-10-CM | POA: Diagnosis not present

## 2015-05-03 DIAGNOSIS — R197 Diarrhea, unspecified: Secondary | ICD-10-CM | POA: Diagnosis not present

## 2015-05-03 DIAGNOSIS — R1033 Periumbilical pain: Secondary | ICD-10-CM | POA: Diagnosis not present

## 2015-05-03 DIAGNOSIS — Z79899 Other long term (current) drug therapy: Secondary | ICD-10-CM | POA: Insufficient documentation

## 2015-05-03 DIAGNOSIS — R519 Headache, unspecified: Secondary | ICD-10-CM

## 2015-05-03 LAB — COMPREHENSIVE METABOLIC PANEL
ALT: 17 U/L (ref 14–54)
ANION GAP: 10 (ref 5–15)
AST: 22 U/L (ref 15–41)
Albumin: 3.3 g/dL — ABNORMAL LOW (ref 3.5–5.0)
Alkaline Phosphatase: 72 U/L (ref 38–126)
BUN: 26 mg/dL — ABNORMAL HIGH (ref 6–20)
CHLORIDE: 96 mmol/L — AB (ref 101–111)
CO2: 28 mmol/L (ref 22–32)
Calcium: 7.6 mg/dL — ABNORMAL LOW (ref 8.9–10.3)
Creatinine, Ser: 3.77 mg/dL — ABNORMAL HIGH (ref 0.44–1.00)
GFR calc non Af Amer: 12 mL/min — ABNORMAL LOW (ref >60)
GFR, EST AFRICAN AMERICAN: 14 mL/min — AB (ref >60)
GLUCOSE: 114 mg/dL — AB (ref 65–99)
Potassium: 3.4 mmol/L — ABNORMAL LOW (ref 3.5–5.1)
Sodium: 134 mmol/L — ABNORMAL LOW (ref 135–145)
Total Bilirubin: 0.8 mg/dL (ref 0.3–1.2)
Total Protein: 7.2 g/dL (ref 6.5–8.1)

## 2015-05-03 LAB — CBC
HCT: 28.7 % — ABNORMAL LOW (ref 35.0–47.0)
Hemoglobin: 9.4 g/dL — ABNORMAL LOW (ref 12.0–16.0)
MCH: 30.1 pg (ref 26.0–34.0)
MCHC: 32.8 g/dL (ref 32.0–36.0)
MCV: 91.8 fL (ref 80.0–100.0)
Platelets: 211 10*3/uL (ref 150–440)
RBC: 3.13 MIL/uL — AB (ref 3.80–5.20)
RDW: 15 % — ABNORMAL HIGH (ref 11.5–14.5)
WBC: 9 10*3/uL (ref 3.6–11.0)

## 2015-05-03 LAB — LIPASE, BLOOD: LIPASE: 22 U/L (ref 22–51)

## 2015-05-03 MED ORDER — ONDANSETRON HCL 4 MG/2ML IJ SOLN
4.0000 mg | Freq: Once | INTRAMUSCULAR | Status: AC
  Administered 2015-05-03: 4 mg via INTRAVENOUS
  Filled 2015-05-03: qty 2

## 2015-05-03 MED ORDER — FENTANYL CITRATE (PF) 100 MCG/2ML IJ SOLN
25.0000 ug | Freq: Once | INTRAMUSCULAR | Status: AC
  Administered 2015-05-03: 25 ug via INTRAVENOUS
  Filled 2015-05-03: qty 2

## 2015-05-03 MED ORDER — SODIUM CHLORIDE 0.9 % IV BOLUS (SEPSIS)
250.0000 mL | Freq: Once | INTRAVENOUS | Status: AC
  Administered 2015-05-03: 250 mL via INTRAVENOUS

## 2015-05-03 NOTE — ED Notes (Signed)
Pt to rm 26 via EMS.  EMS report pt had dialysis today and after had periumbilical pain, headache, and right flank pain.  Pt NAD at this time.

## 2015-05-03 NOTE — ED Provider Notes (Signed)
[facility] Emergency Department Elly Haffey Note  ____________________________________________  Time seen: Approximately 11:06 PM  I have reviewed the triage vital signs and the nursing notes.   HISTORY  Chief Complaint Headache and Abdominal Pain    HPI Jasmine Holloway is a 54 y.o. female who presents to the ED via EMS from home with a chief complaint of headache, abdominal pain, right flank pain and nausea. Symptoms started after hemodialysis at approximately 6 PM; patient could not tolerate her full dialysis and finished 45 minutes early secondary to cramping in hands and legs. States she subsequently ate some [place] food and began to have periumbilical pain associated with nausea. Also complains of nonradiating right flank pain. States her urine has been "strong" recently. Headache is described as a gradual onset migraine without vision changes. Denies recent fever, chills, chest pain, cough, shortness of breath. Describes several loose stools this morning.   Past Medical History  Diagnosis Date  . ESRD (end stage renal disease) on dialysis   . Hypertension   . Polycystic kidney disease     Patient Active Problem List   Diagnosis Date Noted  . Chest pain 02/24/2015    Past Surgical History  Procedure Laterality Date  . Arteriovenous graft placement Right     x3 (R forearm currently used for access)  . Vein harvest      Current Outpatient Rx  Name  Route  Sig  Dispense  Refill  . b complex-vitamin c-folic acid (NEPHRO-VITE) 0.8 MG TABS tablet   Oral   Take 1 tablet by mouth at bedtime.         . calcium acetate (PHOSLO) 667 MG capsule   Oral   Take by mouth 3 (three) times daily with meals.         . cinacalcet (SENSIPAR) 30 MG tablet   Oral   Take 30 mg by mouth daily.         Marland Kitchen gabapentin (NEURONTIN) 300 MG capsule   Oral   Take 300 mg by mouth 3 (three) times daily.         . metoprolol succinate (TOPROL-XL) 50 MG 24 hr tablet    Oral   Take 50 mg by mouth daily. Take with or immediately following a meal.         . omeprazole (PRILOSEC) 20 MG capsule   Oral   Take 20 mg by mouth daily.         . rosuvastatin (CRESTOR) 5 MG tablet   Oral   Take 5 mg by mouth daily.           Allergies Morphine and related and Vicodin  Family History  Problem Relation Age of Onset  . Hypertension Brother     Social History Social History  Substance Use Topics  . Smoking status: Former Research scientist (life sciences)  . Smokeless tobacco: None  . Alcohol Use: No    Review of Systems Constitutional: No fever/chills Eyes: No visual changes. ENT: No sore throat. Cardiovascular: Denies chest pain. Respiratory: Denies shortness of breath. Gastrointestinal: Positive for abdominal pain.  Positive for nausea, no vomiting.  Positive for diarrhea.  No constipation. Genitourinary: Positive for strong smelling urine. Negative for dysuria. Musculoskeletal: Positive for back pain. Skin: Negative for rash. Neurological: Positive for headache. Negative for focal weakness or numbness.  10-point ROS otherwise negative.  ____________________________________________   PHYSICAL EXAM:  VITAL SIGNS: ED Triage Vitals  Enc Vitals Group     BP 05/03/15 2301 170/96 mmHg  Pulse Rate 05/03/15 2301 71     Resp 05/03/15 2301 20     Temp 05/03/15 2301 97.4 F (36.3 C)     Temp Source 05/03/15 2301 Oral     SpO2 05/03/15 2301 99 %     Weight 05/03/15 2301 180 lb (81.647 kg)     Height 05/03/15 2301 5\' 3"  (1.6 m)     Head Cir --      Peak Flow --      Pain Score 05/03/15 2302 9     Pain Loc --      Pain Edu? --      Excl. in [place]? --     Constitutional: Alert and oriented. Well appearing and in no acute distress. Eyes: Conjunctivae are normal. PERRL. EOMI. Head: Atraumatic. Nose: No congestion/rhinnorhea. Mouth/Throat: Mucous membranes are moist.  Oropharynx non-erythematous. Neck: No stridor. No carotid bruits. Cardiovascular: Normal  rate, regular rhythm. Grossly normal heart sounds.  Good peripheral circulation. Respiratory: Normal respiratory effort.  No retractions. Lungs CTAB. Gastrointestinal: Soft, minimally tender to umbilicus without rebound or guarding. No distention. No abdominal bruits. No CVA tenderness. Musculoskeletal: No lower extremity tenderness nor edema.  No joint effusions. Neurologic:  Normal speech and language. No gross focal neurologic deficits are appreciated. No gait instability. Skin:  Skin is warm, dry and intact. No rash noted. Psychiatric: Mood and affect are normal. Speech and behavior are normal.  ____________________________________________   LABS (all labs ordered are listed, but only abnormal results are displayed)  Labs Reviewed  COMPREHENSIVE METABOLIC PANEL - Abnormal; Notable for the following:    Sodium 134 (*)    Potassium 3.4 (*)    Chloride 96 (*)    Glucose, Bld 114 (*)    BUN 26 (*)    Creatinine, Ser 3.77 (*)    Calcium 7.6 (*)    Albumin 3.3 (*)    GFR calc non Af Amer 12 (*)    GFR calc Af Amer 14 (*)    All other components within normal limits  CBC - Abnormal; Notable for the following:    RBC 3.13 (*)    Hemoglobin 9.4 (*)    HCT 28.7 (*)    RDW 15.0 (*)    All other components within normal limits  URINALYSIS COMPLETEWITH MICROSCOPIC ([facility] ONLY) - Abnormal; Notable for the following:    Color, Urine COLORLESS (*)    APPearance CLEAR (*)    Specific Gravity, Urine 1.002 (*)    Hgb urine dipstick 1+ (*)    pH 9.0 (*)    Protein, ur 100 (*)    Squamous Epithelial / LPF 0-5 (*)    All other components within normal limits  LIPASE, BLOOD   ____________________________________________  EKG  ED ECG REPORT I, SUNG,JADE J, the attending physician, personally viewed and interpreted this ECG.   Date: 05/03/2015  EKG Time: 2351  Rate: 68  Rhythm: normal EKG, normal sinus rhythm  Axis: Normal  Intervals:none  ST&T Change:  Nonspecific  ____________________________________________  RADIOLOGY  Chest 2 view (viewed by me, interpreted per Dr. Joneen Caraway): No acute abnormality. Mild cardiomegaly and mild chronic interstitial lung disease.  CT Head per Dr. Rosana Hoes: No acute intracranial process.  Chronic small vessel ischemic changes.   CT Abdomen Pelvis wo Contrast interpreted per Dr. Joneen Caraway: 1. No acute abnormality. 2. Small, nonspecific nodules at the lung bases. In the absence of known malignancy, these most likely represent noncalcified granulomata. If the patient is at high risk for  bronchogenic carcinoma, follow-up chest CT at 1 year is recommended. If the patient is at low risk, no follow-up is needed. This recommendation follows the consensus statement: Guidelines for Management of Small Pulmonary Nodules Detected on CT Scans: A Statement from the [employer] as published in Radiology 2005; 237:395-400. 3. Mild colonic diverticulosis.  ____________________________________________   PROCEDURES  Procedure(s) performed: None  Critical Care performed: No  ____________________________________________   INITIAL IMPRESSION / ASSESSMENT AND PLAN / ED COURSE  Pertinent labs & imaging results that were available during my care of the patient were reviewed by me and considered in my medical decision making (see chart for details).  54 year old female who presents with migraine, right flank and abdominal pain associated with nausea after hemodialysis. Will initiate small IV fluid bolus, analgesia and antiemetic; obtain CT abdomen/pelvis to evaluate etiology of patient's pain.  ----------------------------------------- 1:00 AM on 05/04/2015 -----------------------------------------  Patient soundly asleep. Awakened patient to reassess her pain. Rates pain improved but nausea persists. Will administer IV Reglan. Updated patient of imaging results.  ----------------------------------------- 2:36  AM on 05/04/2015 -----------------------------------------  Nausea improved. Patient tolerating ginger ale. Wants to take taxi home. Will hold an additional hour as patient was given fentanyl approximately 3 hours ago.   ----------------------------------------- 3:30 AM on 05/04/2015 -----------------------------------------  Voicing no complaints. Tolerated PO without emesis. Feels improved overall. Ambulating with steady gait. Safe for discharge home with follow-up with her PCP. Strict return precautions given. Patient verbalizes understanding and agrees with plan of care. ____________________________________________   FINAL CLINICAL IMPRESSION(S) / ED DIAGNOSES  Final diagnoses:  Acute nonintractable headache, unspecified headache type  Periumbilical abdominal pain      Paulette Blanch, MD 05/04/15 [phone]

## 2015-05-04 DIAGNOSIS — R51 Headache: Secondary | ICD-10-CM | POA: Diagnosis not present

## 2015-05-04 LAB — URINALYSIS COMPLETE WITH MICROSCOPIC (ARMC ONLY)
Bacteria, UA: NONE SEEN
Bilirubin Urine: NEGATIVE
Glucose, UA: NEGATIVE mg/dL
KETONES UR: NEGATIVE mg/dL
LEUKOCYTES UA: NEGATIVE
NITRITE: NEGATIVE
PROTEIN: 100 mg/dL — AB
RBC / HPF: NONE SEEN RBC/hpf (ref 0–5)
SPECIFIC GRAVITY, URINE: 1.002 — AB (ref 1.005–1.030)
pH: 9 — ABNORMAL HIGH (ref 5.0–8.0)

## 2015-05-04 MED ORDER — METOCLOPRAMIDE HCL 5 MG/ML IJ SOLN
5.0000 mg | Freq: Once | INTRAMUSCULAR | Status: AC
  Administered 2015-05-04: 5 mg via INTRAVENOUS
  Filled 2015-05-04: qty 2

## 2015-05-04 NOTE — ED Notes (Signed)
Pt given ginger ale for PO challenge per Dr. Beather Arbour request

## 2015-05-04 NOTE — Discharge Instructions (Signed)
Continue all medicines as directed by your doctor. Go to dialysis Monday as scheduled. Return to the ER for worsening symptoms, persistent vomiting, difficulty breathing or other concerns.  Abdominal Pain Many things can cause abdominal pain. Usually, abdominal pain is not caused by a disease and will improve without treatment. It can often be observed and treated at home. Your health care provider will do a physical exam and possibly order blood tests and X-rays to help determine the seriousness of your pain. However, in many cases, more time must pass before a clear cause of the pain can be found. Before that point, your health care provider may not know if you need more testing or further treatment. HOME CARE INSTRUCTIONS  Monitor your abdominal pain for any changes. The following actions may help to alleviate any discomfort you are experiencing:  Only take over-the-counter or prescription medicines as directed by your health care provider.  Do not take laxatives unless directed to do so by your health care provider.  Try a clear liquid diet (broth, tea, or water) as directed by your health care provider. Slowly move to a bland diet as tolerated. SEEK MEDICAL CARE IF:  You have unexplained abdominal pain.  You have abdominal pain associated with nausea or diarrhea.  You have pain when you urinate or have a bowel movement.  You experience abdominal pain that wakes you in the night.  You have abdominal pain that is worsened or improved by eating food.  You have abdominal pain that is worsened with eating fatty foods.  You have a fever. SEEK IMMEDIATE MEDICAL CARE IF:   Your pain does not go away within 2 hours.  You keep throwing up (vomiting).  Your pain is felt only in portions of the abdomen, such as the right side or the left lower portion of the abdomen.  You pass bloody or black tarry stools. MAKE SURE YOU:  Understand these instructions.   Will watch your condition.    Will get help right away if you are not doing well or get worse.  Document Released: 05/06/2005 Document Revised: 08/01/2013 Document Reviewed: 04/05/2013 [facility] Patient Information 2015 [place], [place]. This information is not intended to replace advice given to you by your health care provider. Make sure you discuss any questions you have with your health care provider.  Migraine Headache A migraine headache is an intense, throbbing pain on one or both sides of your head. A migraine can last for 30 minutes to several hours. CAUSES  The exact cause of a migraine headache is not always known. However, a migraine may be caused when nerves in the brain become irritated and release chemicals that cause inflammation. This causes pain. Certain things may also trigger migraines, such as:  Alcohol.  Smoking.  Stress.  Menstruation.  Aged cheeses.  Foods or drinks that contain nitrates, glutamate, aspartame, or tyramine.  Lack of sleep.  Chocolate.  Caffeine.  Hunger.  Physical exertion.  Fatigue.  Medicines used to treat chest pain (nitroglycerine), birth control pills, estrogen, and some blood pressure medicines. SIGNS AND SYMPTOMS  Pain on one or both sides of your head.  Pulsating or throbbing pain.  Severe pain that prevents daily activities.  Pain that is aggravated by any physical activity.  Nausea, vomiting, or both.  Dizziness.  Pain with exposure to bright lights, loud noises, or activity.  General sensitivity to bright lights, loud noises, or smells. Before you get a migraine, you may get warning signs that a migraine  is coming (aura). An aura may include:  Seeing flashing lights.  Seeing bright spots, halos, or zigzag lines.  Having tunnel vision or blurred vision.  Having feelings of numbness or tingling.  Having trouble talking.  Having muscle weakness. DIAGNOSIS  A migraine headache is often diagnosed based on:  Symptoms.  Physical  exam.  A CT scan or MRI of your head. These imaging tests cannot diagnose migraines, but they can help rule out other causes of headaches. TREATMENT Medicines may be given for pain and nausea. Medicines can also be given to help prevent recurrent migraines.  HOME CARE INSTRUCTIONS  Only take over-the-counter or prescription medicines for pain or discomfort as directed by your health care provider. The use of long-term narcotics is not recommended.  Lie down in a dark, quiet room when you have a migraine.  Keep a journal to find out what may trigger your migraine headaches. For example, write down:  What you eat and drink.  How much sleep you get.  Any change to your diet or medicines.  Limit alcohol consumption.  Quit smoking if you smoke.  Get 7-9 hours of sleep, or as recommended by your health care provider.  Limit stress.  Keep lights dim if bright lights bother you and make your migraines worse. SEEK IMMEDIATE MEDICAL CARE IF:   Your migraine becomes severe.  You have a fever.  You have a stiff neck.  You have vision loss.  You have muscular weakness or loss of muscle control.  You start losing your balance or have trouble walking.  You feel faint or pass out.  You have severe symptoms that are different from your first symptoms. MAKE SURE YOU:   Understand these instructions.  Will watch your condition.  Will get help right away if you are not doing well or get worse. Document Released: 07/27/2005 Document Revised: 12/11/2013 Document Reviewed: 04/03/2013 [facility] Patient Information 2015 [place], [place]. This information is not intended to replace advice given to you by your health care provider. Make sure you discuss any questions you have with your health care provider.

## 2015-05-04 NOTE — ED Notes (Signed)
Pt asleep at this time.  O2 turned off to see if pt can maintain o2sat on RA.  Monitoring continued.

## 2015-05-04 NOTE — ED Notes (Signed)
Pt w/ o2sat in upper 80s.  2L O2 applied via [place].  MD informed

## 2015-05-31 DIAGNOSIS — M79602 Pain in left arm: Secondary | ICD-10-CM | POA: Insufficient documentation

## 2015-06-11 DIAGNOSIS — M25562 Pain in left knee: Secondary | ICD-10-CM | POA: Insufficient documentation

## 2015-07-29 ENCOUNTER — Encounter: Payer: Self-pay | Admitting: *Deleted

## 2015-07-29 ENCOUNTER — Emergency Department
Admission: EM | Admit: 2015-07-29 | Discharge: 2015-07-29 | Disposition: A | Discharge status: Home / Self Care | Payer: Medicare Other | Attending: Emergency Medicine | Admitting: Emergency Medicine

## 2015-07-29 DIAGNOSIS — R252 Cramp and spasm: Secondary | ICD-10-CM

## 2015-07-29 DIAGNOSIS — Z79899 Other long term (current) drug therapy: Secondary | ICD-10-CM | POA: Diagnosis not present

## 2015-07-29 DIAGNOSIS — Z992 Dependence on renal dialysis: Secondary | ICD-10-CM | POA: Diagnosis not present

## 2015-07-29 DIAGNOSIS — F419 Anxiety disorder, unspecified: Secondary | ICD-10-CM | POA: Diagnosis not present

## 2015-07-29 DIAGNOSIS — G6289 Other specified polyneuropathies: Secondary | ICD-10-CM | POA: Insufficient documentation

## 2015-07-29 DIAGNOSIS — N186 End stage renal disease: Secondary | ICD-10-CM | POA: Diagnosis not present

## 2015-07-29 DIAGNOSIS — M62838 Other muscle spasm: Secondary | ICD-10-CM | POA: Insufficient documentation

## 2015-07-29 DIAGNOSIS — R290 Tetany: Secondary | ICD-10-CM | POA: Diagnosis not present

## 2015-07-29 DIAGNOSIS — Z87891 Personal history of nicotine dependence: Secondary | ICD-10-CM | POA: Insufficient documentation

## 2015-07-29 DIAGNOSIS — E875 Hyperkalemia: Secondary | ICD-10-CM

## 2015-07-29 DIAGNOSIS — I120 Hypertensive chronic kidney disease with stage 5 chronic kidney disease or end stage renal disease: Secondary | ICD-10-CM | POA: Diagnosis not present

## 2015-07-29 LAB — CBC
HEMATOCRIT: 30.3 % — AB (ref 35.0–47.0)
HEMOGLOBIN: 9.8 g/dL — AB (ref 12.0–16.0)
MCH: 30.1 pg (ref 26.0–34.0)
MCHC: 32.3 g/dL (ref 32.0–36.0)
MCV: 93.4 fL (ref 80.0–100.0)
Platelets: 255 10*3/uL (ref 150–440)
RBC: 3.25 MIL/uL — AB (ref 3.80–5.20)
RDW: 16.3 % — ABNORMAL HIGH (ref 11.5–14.5)
WBC: 10.4 10*3/uL (ref 3.6–11.0)

## 2015-07-29 LAB — BASIC METABOLIC PANEL
ANION GAP: 11 (ref 5–15)
BUN: 63 mg/dL — ABNORMAL HIGH (ref 6–20)
CO2: 23 mmol/L (ref 22–32)
Calcium: 9.1 mg/dL (ref 8.9–10.3)
Chloride: 105 mmol/L (ref 101–111)
Creatinine, Ser: 8.28 mg/dL — ABNORMAL HIGH (ref 0.44–1.00)
GFR calc Af Amer: 6 mL/min — ABNORMAL LOW (ref >60)
GFR calc non Af Amer: 5 mL/min — ABNORMAL LOW (ref >60)
GLUCOSE: 68 mg/dL (ref 65–99)
POTASSIUM: 5.4 mmol/L — AB (ref 3.5–5.1)
Sodium: 139 mmol/L (ref 135–145)

## 2015-07-29 MED ORDER — LORAZEPAM 2 MG/ML IJ SOLN
1.0000 mg | Freq: Once | INTRAMUSCULAR | Status: AC
  Administered 2015-07-29: 1 mg via INTRAVENOUS
  Filled 2015-07-29: qty 1

## 2015-07-29 NOTE — ED Provider Notes (Signed)
[facility] Emergency Department Provider Note REMINDER - THIS NOTE IS NOT A FINAL MEDICAL RECORD UNTIL IT IS SIGNED. UNTIL THEN, THE CONTENT BELOW MAY REFLECT INFORMATION FROM A DOCUMENTATION TEMPLATE, NOT THE ACTUAL PATIENT VISIT. ____________________________________________  Time seen: Approximately 11:43 PM  I have reviewed the triage vital signs and the nursing notes.   HISTORY  Chief Complaint Spasms    HPI Jasmine Holloway is a 54 y.o. female who reports a history of dialysis dependence due to polycystic kidney disease and hypertension.  Patient was at the hospital visiting a friend when she developed severe cramping in her left hand and then cramping and tingling in all arms and toes. She reports she has a history of similar symptoms and was attributed to episode of anxiety. She reports she has the same happened her several times a year, and that she also had a previous surgery on her left hand that is left her with occasional and frequent episodes of severe cramping.  She reports she last had her dialysis done 2 days ago, and her For dialysis as scheduled tomorrow. She denies any chest pain or trouble breathing. She denies any headache, no weakness in the arms or legs.She does have chronic numbness in the feet.   Past Medical History  Diagnosis Date  . ESRD (end stage renal disease) on dialysis ([place])   . Hypertension   . Polycystic kidney disease     Patient Active Problem List   Diagnosis Date Noted  . Chest pain 02/24/2015    Past Surgical History  Procedure Laterality Date  . Arteriovenous graft placement Right     x3 (R forearm currently used for access)  . Vein harvest      Current Outpatient Rx  Name  Route  Sig  Dispense  Refill  . b complex-vitamin c-folic acid (NEPHRO-VITE) 0.8 MG TABS tablet   Oral   Take 1 tablet by mouth at bedtime.         . calcium acetate (PHOSLO) 667 MG capsule   Oral   Take by mouth 3 (three) times  daily with meals.         . cinacalcet (SENSIPAR) 30 MG tablet   Oral   Take 30 mg by mouth daily.         Marland Kitchen gabapentin (NEURONTIN) 300 MG capsule   Oral   Take 300 mg by mouth 3 (three) times daily.         . metoprolol succinate (TOPROL-XL) 50 MG 24 hr tablet   Oral   Take 50 mg by mouth daily. Take with or immediately following a meal.         . omeprazole (PRILOSEC) 20 MG capsule   Oral   Take 20 mg by mouth daily.         Marland Kitchen oxyCODONE-acetaminophen (PERCOCET) 10-325 MG tablet   Oral   Take 1 tablet by mouth daily as needed.      0   . rosuvastatin (CRESTOR) 5 MG tablet   Oral   Take 5 mg by mouth daily.           Allergies Morphine and related and Vicodin  Family History  Problem Relation Age of Onset  . Hypertension Brother     Social History Social History  Substance Use Topics  . Smoking status: Former Research scientist (life sciences)  . Smokeless tobacco: None  . Alcohol Use: No    Review of Systems Constitutional: No fever/chills Eyes: No visual changes. ENT: No  sore throat. Cardiovascular: Denies chest pain. Respiratory: Denies shortness of breath. Gastrointestinal: No abdominal pain.  No nausea, no vomiting.  No diarrhea.  No constipation. Genitourinary: Negative for dysuria. Musculoskeletal: Negative for back pain. Skin: Negative for rash. Neurological: Negative for headaches, focal weakness or numbness In her hands and toes with cramping and "spasm".  10-point ROS otherwise negative.  ____________________________________________   PHYSICAL EXAM:  VITAL SIGNS: ED Triage Vitals  Enc Vitals Group     BP 07/29/15 1830 151/93 mmHg     Pulse Rate 07/29/15 1732 86     Resp 07/29/15 1732 20     Temp 07/29/15 1732 97.8 F (36.6 C)     Temp Source 07/29/15 1732 Oral     SpO2 07/29/15 1732 100 %     Weight 07/29/15 1732 180 lb (81.647 kg)     Height 07/29/15 1732 5\' 3"  (1.6 m)     Head Cir --      Peak Flow --      Pain Score 07/29/15 1734 10      Pain Loc --      Pain Edu? --      Excl. in [place]? --    Constitutional: Alert and oriented. She is anxious appearing, demonstrates carpopedal spasm in all extremities.  Eyes: Conjunctivae are normal. PERRL. EOMI. Head: Atraumatic. Nose: No congestion/rhinnorhea. Mouth/Throat: Mucous membranes are moist.  Oropharynx non-erythematous. Neck: No stridor.   Cardiovascular: Normal rate, regular rhythm. Grossly normal heart sounds.  Good peripheral circulation. Respiratory: Normal respiratory effort.  No retractions. Lungs CTAB. Gastrointestinal: Soft and nontender. No distention. No abdominal bruits. No CVA tenderness. Musculoskeletal: No lower extremity tenderness nor edema.  No joint effusions. Carpopedal spasm. Neurologic:  Normal speech and language. No gross focal neurologic deficits are appreciated. 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  Skin:  Skin is warm, dry and intact. No rash noted. Psychiatric: Mood and affect are anxious  At 1900 hrs. repeat exam performed and the patient is currently sitting up eating chips in reporting all symptoms resolved. She has no longer any ongoing carpopedal spasm. She is able to ambulate with normal gait. Appears all symptoms are resolved. I did call and discuss her chemistry and potassium level with Dr. Candiss Norse of nephrology. He reports heis patient well and advises no recommendation to change treatment plan and that for her she should attend dialysis tomorrow, no concerns regarding current potassium level given the plan for dialysis tomorrow. I'm in agreement, there are no EKG changes of hyperkalemia and the patient does not appear to have symptomatic hyperkalemia. She has a plan for dialysis tomorrow.  ____________________________________________   LABS (all labs ordered are listed, but only abnormal results are displayed)  Labs Reviewed  BASIC METABOLIC PANEL - Abnormal; Notable for the following:    Potassium 5.4 (*)    BUN 63 (*)    Creatinine, Ser  8.28 (*)    GFR calc non Af Amer 5 (*)    GFR calc Af Amer 6 (*)    All other components within normal limits  CBC - Abnormal; Notable for the following:    RBC 3.25 (*)    Hemoglobin 9.8 (*)    HCT 30.3 (*)    RDW 16.3 (*)    All other components within normal limits   ____________________________________________  EKG  Reviewed and interpreted by me at 1930 hrs. Sinus rhythm Heart rate 76 PR 150 QRS 90 QTc 440 No signs or symptoms of hyperkalemia noted ____________________________________________  RADIOLOGY  ____________________________________________   PROCEDURES  Procedure(s) performed: None  Critical Care performed: No  ____________________________________________   INITIAL IMPRESSION / ASSESSMENT AND PLAN / ED COURSE  Pertinent labs & imaging results that were available during my care of the patient were reviewed by me and considered in my medical decision making (see chart for details).  Patient resents with carpopedal spasm. Evidently recurrent. We will check labs as she is a dialysis patient. Sounds this is a recurrent process, and I suspect some spasm with associated possible episode of hyperventilation which is now improved after Ativan. A symptomatic after benzodiazepine treatment. Labs reviewed discussed with Dr. Candiss Norse and has planned for dialysis tomorrow. Discharge the patient return precautions discussed. Patient's family driving her home. She agrees not to drive this evening after receiving Ativan. ____________________________________________   FINAL CLINICAL IMPRESSION(S) / ED DIAGNOSES  Final diagnoses:  Hand or foot spasms  Hyperkalemia      Delman Kitten, MD 07/29/15 2348

## 2015-07-29 NOTE — ED Notes (Addendum)
Pt c/o of cramping in all 4 extremeites with tinling since 1530 today. Pt c/o chronic numbness . Pt is dialysis pt. Pt crying and poor historian at this time.

## 2015-07-29 NOTE — ED Notes (Signed)
Iv started.  Labs sent.  meds given.

## 2015-07-29 NOTE — ED Notes (Signed)
Pt to ED after code 250 called to visitor entrance, pt c/c of hand and foot muscle cramping and spasms. Pt AAOx4, vitals wnl, no acute distress noted. Pt very anxious and tearful at this time

## 2015-07-29 NOTE — Discharge Instructions (Signed)
This very important to follow-up for your normal dialysis tomorrow.  Please go to dialysis tomorrow. Return to emergency room right away if he develop any chest pain, trouble breathing, weakness, numbness or other new concerns arise.

## 2015-07-29 NOTE — ED Notes (Signed)
MD Quale at bedside. 

## 2015-08-06 ENCOUNTER — Emergency Department: Payer: Medicare Other

## 2015-08-06 ENCOUNTER — Emergency Department
Admission: EM | Admit: 2015-08-06 | Discharge: 2015-08-06 | Disposition: A | Discharge status: Home / Self Care | Payer: Medicare Other | Attending: Emergency Medicine | Admitting: Emergency Medicine

## 2015-08-06 DIAGNOSIS — I120 Hypertensive chronic kidney disease with stage 5 chronic kidney disease or end stage renal disease: Secondary | ICD-10-CM | POA: Diagnosis not present

## 2015-08-06 DIAGNOSIS — R05 Cough: Secondary | ICD-10-CM | POA: Diagnosis present

## 2015-08-06 DIAGNOSIS — F419 Anxiety disorder, unspecified: Secondary | ICD-10-CM | POA: Insufficient documentation

## 2015-08-06 DIAGNOSIS — N186 End stage renal disease: Secondary | ICD-10-CM | POA: Insufficient documentation

## 2015-08-06 DIAGNOSIS — J069 Acute upper respiratory infection, unspecified: Secondary | ICD-10-CM | POA: Diagnosis not present

## 2015-08-06 DIAGNOSIS — Z87891 Personal history of nicotine dependence: Secondary | ICD-10-CM | POA: Insufficient documentation

## 2015-08-06 DIAGNOSIS — Z79899 Other long term (current) drug therapy: Secondary | ICD-10-CM | POA: Insufficient documentation

## 2015-08-06 NOTE — ED Provider Notes (Signed)
[facility] Emergency Department Provider Note  ____________________________________________  Time seen: 7 am  I have reviewed the triage vital signs and the nursing notes.   HISTORY  Chief Complaint Cough and Nasal Congestion    HPI Jasmine Holloway is a 54 y.o. female who presents with complaints of cough.  She reports she has had sinus congestion which then seemed to spread to her chest and she has had a productive cough for the last few days. Patient does have a history of kidney disease and is dialysis dependent.She went to dialysis today and they told her to come to the emergency room to get checked out because she was having a cough. She denies chest pain. No shortness of breath. She is compliant with her dialysis. No fevers noted     Past Medical History  Diagnosis Date  . ESRD (end stage renal disease) on dialysis ([place])   . Hypertension   . Polycystic kidney disease     Patient Active Problem List   Diagnosis Date Noted  . Chest pain 02/24/2015    Past Surgical History  Procedure Laterality Date  . Arteriovenous graft placement Right     x3 (R forearm currently used for access)  . Vein harvest      Current Outpatient Rx  Name  Route  Sig  Dispense  Refill  . b complex-vitamin c-folic acid (NEPHRO-VITE) 0.8 MG TABS tablet   Oral   Take 1 tablet by mouth at bedtime.         . calcium acetate (PHOSLO) 667 MG capsule   Oral   Take by mouth 3 (three) times daily with meals.         . cinacalcet (SENSIPAR) 30 MG tablet   Oral   Take 30 mg by mouth daily.         Marland Kitchen gabapentin (NEURONTIN) 300 MG capsule   Oral   Take 300 mg by mouth 3 (three) times daily.         . metoprolol succinate (TOPROL-XL) 50 MG 24 hr tablet   Oral   Take 50 mg by mouth daily. Take with or immediately following a meal.         . omeprazole (PRILOSEC) 20 MG capsule   Oral   Take 20 mg by mouth daily.         Marland Kitchen oxyCODONE-acetaminophen (PERCOCET)  10-325 MG tablet   Oral   Take 1 tablet by mouth daily as needed.      0   . rosuvastatin (CRESTOR) 5 MG tablet   Oral   Take 5 mg by mouth daily.           Allergies Morphine and related and Vicodin  Family History  Problem Relation Age of Onset  . Hypertension Brother     Social History Social History  Substance Use Topics  . Smoking status: Former Research scientist (life sciences)  . Smokeless tobacco: Not on file  . Alcohol Use: No    Review of Systems  Constitutional: Negative for fever. Eyes: Negative for visual changes. ENT: Negative for sore throat Cardiovascular: Negative for chest pain. Respiratory: Positive for cough Gastrointestinal: Negative for abdominal pain, vomiting and diarrhea. Genitourinary: Negative for dysuria. Musculoskeletal: Negative for back pain. Skin: Negative for rash. Neurological: Negative for headaches or focal weakness Psychiatric: Mild anxiety    ____________________________________________   PHYSICAL EXAM:  VITAL SIGNS: ED Triage Vitals  Enc Vitals Group     BP 08/06/15 0629 138/89 mmHg  Pulse Rate 08/06/15 0629 70     Resp 08/06/15 0629 22     Temp 08/06/15 0629 97.7 F (36.5 C)     Temp Source 08/06/15 0629 Oral     SpO2 08/06/15 0629 97 %     Weight 08/06/15 0629 180 lb (81.647 kg)     Height 08/06/15 0629 5\' 3"  (1.6 m)     Head Cir --      Peak Flow --      Pain Score --      Pain Loc --      Pain Edu? --      Excl. in [place]? --     Constitutional: Alert and oriented. Well appearing and in no distress. Eyes: Conjunctivae are normal.  ENT   Head: Normocephalic and atraumatic.   Mouth/Throat: Mucous membranes are moist. Cardiovascular: Normal rate, regular rhythm. Normal and symmetric distal pulses are present in all extremities. No murmurs, rubs, or gallops. Respiratory: Normal respiratory effort without tachypnea nor retractions. Breath sounds are clear and equal bilaterally, no rales Gastrointestinal: Soft and non-tender  in all quadrants. No distention. There is no CVA tenderness. Genitourinary: deferred Musculoskeletal: Nontender with normal range of motion in all extremities. No lower extremity tenderness nor edema. Neurologic:  Normal speech and language. No gross focal neurologic deficits are appreciated. Skin:  Skin is warm, dry and intact. No rash noted. Psychiatric: Mood and affect are normal. Patient exhibits appropriate insight and judgment.  ____________________________________________    LABS (pertinent positives/negatives)  Labs Reviewed - No data to display  ____________________________________________   EKG  None  ____________________________________________    RADIOLOGY I have personally reviewed any xrays that were ordered on this patient: Cardiomegaly and vascular congestion on chest x-ray  ____________________________________________   PROCEDURES  Procedure(s) performed: none  Critical Care performed: none  ____________________________________________   INITIAL IMPRESSION / ASSESSMENT AND PLAN / ED COURSE  Pertinent labs & imaging results that were available during my care of the patient were reviewed by me and considered in my medical decision making (see chart for details).  No evidence of pneumonia on chest x-ray. No hypoxia or distress. Benign exam. Chest x-ray does show vascular congestion which is understandable as patient is due for dialysis today. Discussed with the patient I feel that discharge to dialysis is appropriate as I believe this is an upper respiratory infection and symptomatic care is required. She agrees with plan and will return if any worsening symptoms  ____________________________________________   FINAL CLINICAL IMPRESSION(S) / ED DIAGNOSES  Final diagnoses:  Upper respiratory infection     Lavonia Drafts, MD 08/06/15 [id number]

## 2015-08-06 NOTE — Discharge Instructions (Signed)
Please go to dialysis immediately after discharge as we discussed   Upper Respiratory Infection, Adult Most upper respiratory infections (URIs) are a viral infection of the air passages leading to the lungs. A URI affects the nose, throat, and upper air passages. The most common type of URI is nasopharyngitis and is typically referred to as "the common cold." URIs run their course and usually go away on their own. Most of the time, a URI does not require medical attention, but sometimes a bacterial infection in the upper airways can follow a viral infection. This is called a secondary infection. Sinus and middle ear infections are common types of secondary upper respiratory infections. Bacterial pneumonia can also complicate a URI. A URI can worsen asthma and chronic obstructive pulmonary disease (COPD). Sometimes, these complications can require emergency medical care and may be life threatening.  CAUSES Almost all URIs are caused by viruses. A virus is a type of germ and can spread from one person to another.  RISKS FACTORS You may be at risk for a URI if:   You smoke.   You have chronic heart or lung disease.  You have a weakened defense (immune) system.   You are very young or very old.   You have nasal allergies or asthma.  You work in crowded or poorly ventilated areas.  You work in health care facilities or schools. SIGNS AND SYMPTOMS  Symptoms typically develop 2-3 days after you come in contact with a cold virus. Most viral URIs last 7-10 days. However, viral URIs from the influenza virus (flu virus) can last 14-18 days and are typically more severe. Symptoms may include:   Runny or stuffy (congested) nose.   Sneezing.   Cough.   Sore throat.   Headache.   Fatigue.   Fever.   Loss of appetite.   Pain in your forehead, behind your eyes, and over your cheekbones (sinus pain).  Muscle aches.  DIAGNOSIS  Your health care provider may diagnose a URI  by:  Physical exam.  Tests to check that your symptoms are not due to another condition such as:  Strep throat.  Sinusitis.  Pneumonia.  Asthma. TREATMENT  A URI goes away on its own with time. It cannot be cured with medicines, but medicines may be prescribed or recommended to relieve symptoms. Medicines may help:  Reduce your fever.  Reduce your cough.  Relieve nasal congestion. HOME CARE INSTRUCTIONS   Take medicines only as directed by your health care provider.   Gargle warm saltwater or take cough drops to comfort your throat as directed by your health care provider.  Use a warm mist humidifier or inhale steam from a shower to increase air moisture. This may make it easier to breathe.  Drink enough fluid to keep your urine clear or pale yellow.   Eat soups and other clear broths and maintain good nutrition.   Rest as needed.   Return to work when your temperature has returned to normal or as your health care provider advises. You may need to stay home longer to avoid infecting others. You can also use a face mask and careful hand washing to prevent spread of the virus.  Increase the usage of your inhaler if you have asthma.   Do not use any tobacco products, including cigarettes, chewing tobacco, or electronic cigarettes. If you need help quitting, ask your health care provider. PREVENTION  The best way to protect yourself from getting a cold is to practice good  hygiene.   Avoid oral or hand contact with people with cold symptoms.   Wash your hands often if contact occurs.  There is no clear evidence that vitamin C, vitamin E, echinacea, or exercise reduces the chance of developing a cold. However, it is always recommended to get plenty of rest, exercise, and practice good nutrition.  SEEK MEDICAL CARE IF:   You are getting worse rather than better.   Your symptoms are not controlled by medicine.   You have chills.  You have worsening shortness of  breath.  You have brown or red mucus.  You have yellow or brown nasal discharge.  You have pain in your face, especially when you bend forward.  You have a fever.  You have swollen neck glands.  You have pain while swallowing.  You have white areas in the back of your throat. SEEK IMMEDIATE MEDICAL CARE IF:   You have severe or persistent:  Headache.  Ear pain.  Sinus pain.  Chest pain.  You have chronic lung disease and any of the following:  Wheezing.  Prolonged cough.  Coughing up blood.  A change in your usual mucus.  You have a stiff neck.  You have changes in your:  Vision.  Hearing.  Thinking.  Mood. MAKE SURE YOU:   Understand these instructions.  Will watch your condition.  Will get help right away if you are not doing well or get worse.   This information is not intended to replace advice given to you by your health care provider. Make sure you discuss any questions you have with your health care provider.   Document Released: 01/20/2001 Document Revised: 12/11/2014 Document Reviewed: 11/01/2013 Elsevier Interactive Patient Education [employer].

## 2015-08-06 NOTE — ED Notes (Signed)
Pt to triage via w/c with frequent cough noted, mask in place; st prod cough yellow sputum with sinus congestion and fever, wheezing

## 2015-09-10 ENCOUNTER — Emergency Department
Admission: EM | Admit: 2015-09-10 | Discharge: 2015-09-10 | Disposition: A | Discharge status: Home / Self Care | Payer: Medicare Other | Attending: Student | Admitting: Student

## 2015-09-10 ENCOUNTER — Encounter: Payer: Self-pay | Admitting: Emergency Medicine

## 2015-09-10 DIAGNOSIS — I120 Hypertensive chronic kidney disease with stage 5 chronic kidney disease or end stage renal disease: Secondary | ICD-10-CM | POA: Insufficient documentation

## 2015-09-10 DIAGNOSIS — N186 End stage renal disease: Secondary | ICD-10-CM | POA: Insufficient documentation

## 2015-09-10 DIAGNOSIS — R059 Cough, unspecified: Secondary | ICD-10-CM

## 2015-09-10 DIAGNOSIS — R05 Cough: Secondary | ICD-10-CM | POA: Diagnosis present

## 2015-09-10 DIAGNOSIS — Z992 Dependence on renal dialysis: Secondary | ICD-10-CM | POA: Insufficient documentation

## 2015-09-10 DIAGNOSIS — Z79899 Other long term (current) drug therapy: Secondary | ICD-10-CM | POA: Insufficient documentation

## 2015-09-10 DIAGNOSIS — Z87891 Personal history of nicotine dependence: Secondary | ICD-10-CM | POA: Insufficient documentation

## 2015-09-10 HISTORY — DX: Chronic obstructive pulmonary disease, unspecified: J44.9

## 2015-09-10 HISTORY — DX: Unspecified asthma, uncomplicated: J45.909

## 2015-09-10 MED ORDER — PSEUDOEPH-BROMPHEN-DM 30-2-10 MG/5ML PO SYRP: 5.0000 mL | ORAL_SOLUTION | Freq: Four times a day (QID) | ORAL | Status: DC | PRN

## 2015-09-10 NOTE — Discharge Instructions (Signed)

## 2015-09-10 NOTE — ED Notes (Signed)
Pt reports cough started one month ago; pt was seen here.  Reports cough never went away.  Pt has not seen PCP.

## 2015-09-10 NOTE — ED Provider Notes (Signed)
[facility] Emergency Department Provider Note  ____________________________________________  Time seen: Approximately 11:25 AM  I have reviewed the triage vital signs and the nursing notes.   HISTORY  Chief Complaint Cough    HPI Jasmine Holloway is a 55 y.o. female patient here for reevaluation of continued cough for 1 month. Patient was seen here last month x-rays were revealing mild pulmonary edema. Patient cough is worse with laying down. Patient states she has not follow-up family doctor as directed. Patient rates the pain and discomfort as 8/10. It is currently taking Mucinex. No other palliative measures taken for this complaint. Patient denies dyspnea or chest pain.   Past Medical History  Diagnosis Date  . ESRD (end stage renal disease) on dialysis ([place])   . Hypertension   . Polycystic kidney disease   . Asthma   . COPD (chronic obstructive pulmonary disease) [facility])     Patient Active Problem List   Diagnosis Date Noted  . Chest pain 02/24/2015    Past Surgical History  Procedure Laterality Date  . Arteriovenous graft placement Right     x3 (R forearm currently used for access)  . Vein harvest      Current Outpatient Rx  Name  Route  Sig  Dispense  Refill  . b complex-vitamin c-folic acid (NEPHRO-VITE) 0.8 MG TABS tablet   Oral   Take 1 tablet by mouth at bedtime.         . brompheniramine-pseudoephedrine-DM 30-2-10 MG/5ML syrup   Oral   Take 5 mLs by mouth 4 (four) times daily as needed.   120 mL   0   . calcium acetate (PHOSLO) 667 MG capsule   Oral   Take by mouth 3 (three) times daily with meals.         . cinacalcet (SENSIPAR) 30 MG tablet   Oral   Take 30 mg by mouth daily.         Marland Kitchen gabapentin (NEURONTIN) 300 MG capsule   Oral   Take 300 mg by mouth 3 (three) times daily.         . metoprolol succinate (TOPROL-XL) 50 MG 24 hr tablet   Oral   Take 50 mg by mouth daily. Take with or immediately following a  meal.         . omeprazole (PRILOSEC) 20 MG capsule   Oral   Take 20 mg by mouth daily.         Marland Kitchen oxyCODONE-acetaminophen (PERCOCET) 10-325 MG tablet   Oral   Take 1 tablet by mouth daily as needed.      0   . rosuvastatin (CRESTOR) 5 MG tablet   Oral   Take 5 mg by mouth daily.           Allergies Morphine and related and Vicodin  Family History  Problem Relation Age of Onset  . Hypertension Brother     Social History Social History  Substance Use Topics  . Smoking status: Former Research scientist (life sciences)  . Smokeless tobacco: None  . Alcohol Use: No    Review of Systems Constitutional: No fever/chills Eyes: No visual changes. ENT: No sore throat. Cardiovascular: Denies chest pain. Respiratory: Denies shortness of breath. Productive cough Gastrointestinal: No abdominal pain.  No nausea, no vomiting.  No diarrhea.  No constipation. Genitourinary: Negative for dysuria. Musculoskeletal: Negative for back pain. Skin: Negative for rash. Neurological: Negative for headaches, focal weakness or numbness. Allergic/Immunilogical: Morphine and related products.  ____________________________________________   PHYSICAL EXAM:  VITAL SIGNS: ED Triage Vitals  Enc Vitals Group     BP --      Pulse Rate 09/10/15 1105 68     Resp 09/10/15 1105 18     Temp 09/10/15 1105 98.4 F (36.9 C)     Temp Source 09/10/15 1105 Oral     SpO2 09/10/15 1105 95 %     Weight 09/10/15 1105 180 lb (81.647 kg)     Height 09/10/15 1105 5\' 3"  (1.6 m)     Head Cir --      Peak Flow --      Pain Score 09/10/15 1105 8     Pain Loc --      Pain Edu? --      Excl. in [place]? --     Constitutional: Alert and oriented. Well appearing and in no acute distress. Eyes: Conjunctivae are normal. PERRL. EOMI. Head: Atraumatic. Nose: No congestion/rhinnorhea. Mouth/Throat: Mucous membranes are moist.  Oropharynx non-erythematous. Neck: No stridor. No cervical spine tenderness to  palpation. Hematological/Lymphatic/Immunilogical: No cervical lymphadenopathy. Cardiovascular: Normal rate, regular rhythm. Grossly normal heart sounds.  Good peripheral circulation. Respiratory: Normal respiratory effort.  No retractions. Lungs CTAB. Productive cough Gastrointestinal: Soft and nontender. No distention. No abdominal bruits. No CVA tenderness. Musculoskeletal: No lower extremity tenderness nor edema.  No joint effusions. Neurologic:  Normal speech and language. No gross focal neurologic deficits are appreciated. No gait instability. Skin:  Skin is warm, dry and intact. No rash noted. Psychiatric: Mood and affect are normal. Speech and behavior are normal.  ____________________________________________   LABS (all labs ordered are listed, but only abnormal results are displayed)  Labs Reviewed - No data to display ____________________________________________  EKG   ____________________________________________  RADIOLOGY  Reviewed x-ray results from previous visit. ____________________________________________   PROCEDURES  Procedure(s) performed: None  Critical Care performed: No  ____________________________________________   INITIAL IMPRESSION / ASSESSMENT AND PLAN / ED COURSE  Pertinent labs & imaging results that were available during my care of the patient were reviewed by me and considered in my medical decision making (see chart for details).  Chronic cough secondary to mild pulmonary edema.  Discuss x-ray results with patient. Patient will follow-up family doctor tomorrow for definitive treatment. ____________________________________________   FINAL CLINICAL IMPRESSION(S) / ED DIAGNOSES  Final diagnoses:  Cough      Sable Feil, PA-C 09/10/15 1142  Joanne Gavel, MD 09/10/15 1800

## 2015-09-28 ENCOUNTER — Emergency Department
Admission: EM | Admit: 2015-09-28 | Discharge: 2015-09-28 | Disposition: A | Discharge status: Home / Self Care | Payer: Medicare Other | Attending: Emergency Medicine | Admitting: Emergency Medicine

## 2015-09-28 ENCOUNTER — Emergency Department: Payer: Medicare Other

## 2015-09-28 ENCOUNTER — Encounter: Payer: Self-pay | Admitting: Emergency Medicine

## 2015-09-28 DIAGNOSIS — Z87891 Personal history of nicotine dependence: Secondary | ICD-10-CM | POA: Diagnosis not present

## 2015-09-28 DIAGNOSIS — Z992 Dependence on renal dialysis: Secondary | ICD-10-CM | POA: Insufficient documentation

## 2015-09-28 DIAGNOSIS — Z79899 Other long term (current) drug therapy: Secondary | ICD-10-CM | POA: Insufficient documentation

## 2015-09-28 DIAGNOSIS — R05 Cough: Secondary | ICD-10-CM | POA: Diagnosis present

## 2015-09-28 DIAGNOSIS — I120 Hypertensive chronic kidney disease with stage 5 chronic kidney disease or end stage renal disease: Secondary | ICD-10-CM | POA: Diagnosis not present

## 2015-09-28 DIAGNOSIS — J441 Chronic obstructive pulmonary disease with (acute) exacerbation: Secondary | ICD-10-CM | POA: Insufficient documentation

## 2015-09-28 DIAGNOSIS — N186 End stage renal disease: Secondary | ICD-10-CM | POA: Insufficient documentation

## 2015-09-28 LAB — BASIC METABOLIC PANEL
Anion gap: 11 (ref 5–15)
BUN: 43 mg/dL — ABNORMAL HIGH (ref 6–20)
CALCIUM: 8.1 mg/dL — AB (ref 8.9–10.3)
CHLORIDE: 107 mmol/L (ref 101–111)
CO2: 23 mmol/L (ref 22–32)
Creatinine, Ser: 7.32 mg/dL — ABNORMAL HIGH (ref 0.44–1.00)
GFR, EST AFRICAN AMERICAN: 6 mL/min — AB (ref >60)
GFR, EST NON AFRICAN AMERICAN: 6 mL/min — AB (ref >60)
Glucose, Bld: 81 mg/dL (ref 65–99)
POTASSIUM: 4.8 mmol/L (ref 3.5–5.1)
SODIUM: 141 mmol/L (ref 135–145)

## 2015-09-28 LAB — CBC
HEMATOCRIT: 31.2 % — AB (ref 35.0–47.0)
HEMOGLOBIN: 10 g/dL — AB (ref 12.0–16.0)
MCH: 29.3 pg (ref 26.0–34.0)
MCHC: 32 g/dL (ref 32.0–36.0)
MCV: 91.5 fL (ref 80.0–100.0)
Platelets: 173 10*3/uL (ref 150–440)
RBC: 3.41 MIL/uL — AB (ref 3.80–5.20)
RDW: 17 % — ABNORMAL HIGH (ref 11.5–14.5)
WBC: 10.4 10*3/uL (ref 3.6–11.0)

## 2015-09-28 LAB — TROPONIN I: TROPONIN I: 0.07 ng/mL — AB (ref <0.031)

## 2015-09-28 MED ORDER — PREDNISONE 20 MG PO TABS: 60.0000 mg | ORAL_TABLET | Freq: Every day | ORAL | Status: AC

## 2015-09-28 MED ORDER — IPRATROPIUM-ALBUTEROL 0.5-2.5 (3) MG/3ML IN SOLN
3.0000 mL | Freq: Once | RESPIRATORY_TRACT | Status: AC
  Administered 2015-09-28: 3 mL via RESPIRATORY_TRACT
  Filled 2015-09-28: qty 3

## 2015-09-28 MED ORDER — PREDNISONE 20 MG PO TABS
60.0000 mg | ORAL_TABLET | Freq: Once | ORAL | Status: AC
  Administered 2015-09-28: 60 mg via ORAL
  Filled 2015-09-28: qty 3

## 2015-09-28 NOTE — ED Notes (Signed)
Patient woke up with difficulty breathing and chest pressure. Patient with labored breathing.

## 2015-09-28 NOTE — ED Notes (Signed)
Pt states im feeling better.  Pt discharged and going to dialysis.  Iv removed and meds given.

## 2015-09-28 NOTE — Discharge Instructions (Signed)

## 2015-09-28 NOTE — ED Notes (Addendum)
Pt states she was awakened with chest pressure/heaviness and wheezing this morning.  Pt is a diaylsis pt.  On arrival to treatment room, pt with scattered wheezes heard.  Pt alert and talking.  Skin warm and dry.  nsr on monitor.  Family with pt  Breathing treatments given to pt.

## 2015-09-28 NOTE — ED Notes (Signed)
Lab called with troponin 0.07  Dr brown aware.

## 2015-09-28 NOTE — ED Provider Notes (Signed)
[facility] Emergency Department Provider Note  ____________________________________________  Time seen: 3:00 AM  I have reviewed the triage vital signs and the nursing notes.   HISTORY  Chief Complaint Respiratory Distress     HPI Jasmine Holloway is a 55 y.o. female with history of COPD asthma hypertension polycystic kidney disease end-stage renal presents with cough wheezing or difficulty breathing or chest pressure on awakening this morning. Patient admits to subjective fevers at home afebrile on presentation to the emergency department with a temperature of 96.8. Patient admits to previous tobacco abuse which she stopped 2 weeks ago.     Past Medical History  Diagnosis Date  . ESRD (end stage renal disease) on dialysis ([place])   . Hypertension   . Polycystic kidney disease   . Asthma   . COPD (chronic obstructive pulmonary disease) [facility])     Patient Active Problem List   Diagnosis Date Noted  . Chest pain 02/24/2015    Past Surgical History  Procedure Laterality Date  . Arteriovenous graft placement Right     x3 (R forearm currently used for access)  . Vein harvest      Current Outpatient Rx  Name  Route  Sig  Dispense  Refill  . b complex-vitamin c-folic acid (NEPHRO-VITE) 0.8 MG TABS tablet   Oral   Take 1 tablet by mouth at bedtime.         . calcium acetate (PHOSLO) 667 MG capsule   Oral   Take by mouth 3 (three) times daily with meals.         . cinacalcet (SENSIPAR) 30 MG tablet   Oral   Take 30 mg by mouth daily.         Marland Kitchen gabapentin (NEURONTIN) 300 MG capsule   Oral   Take 300 mg by mouth 3 (three) times daily.         . GuaiFENesin (MUCINEX PO)   Oral   Take by mouth.         . metoprolol succinate (TOPROL-XL) 50 MG 24 hr tablet   Oral   Take 50 mg by mouth daily. Take with or immediately following a meal.         . omeprazole (PRILOSEC) 20 MG capsule   Oral   Take 20 mg by mouth daily.         Marland Kitchen  oxyCODONE-acetaminophen (PERCOCET) 10-325 MG tablet   Oral   Take 1 tablet by mouth daily as needed.      0   . rosuvastatin (CRESTOR) 5 MG tablet   Oral   Take 5 mg by mouth daily.         . brompheniramine-pseudoephedrine-DM 30-2-10 MG/5ML syrup   Oral   Take 5 mLs by mouth 4 (four) times daily as needed. Patient not taking: Reported on 09/28/2015   120 mL   0     Allergies Morphine and related and Vicodin  Family History  Problem Relation Age of Onset  . Hypertension Brother     Social History Social History  Substance Use Topics  . Smoking status: Former Research scientist (life sciences)  . Smokeless tobacco: None  . Alcohol Use: No    Review of Systems  Constitutional: Negative for fever. Eyes: Negative for visual changes. ENT: Negative for sore throat. Cardiovascular: Negative for chest pain. Respiratory: Positive for cough and shortness of breath Gastrointestinal: Negative for abdominal pain, vomiting and diarrhea. Genitourinary: Negative for dysuria. Musculoskeletal: Negative for back pain. Skin: Negative for rash. Neurological:  Negative for headaches, focal weakness or numbness.   10-point ROS otherwise negative.  ____________________________________________   PHYSICAL EXAM:  VITAL SIGNS: ED Triage Vitals  Enc Vitals Group     BP 09/28/15 0300 169/111 mmHg     Pulse Rate 09/28/15 0300 82     Resp 09/28/15 0300 28     Temp 09/28/15 0300 96.8 F (36 C)     Temp Source 09/28/15 0300 Oral     SpO2 09/28/15 0300 99 %     Weight 09/28/15 0300 185 lb (83.915 kg)     Height 09/28/15 0300 5\' 3"  (1.6 m)     Head Cir --      Peak Flow --      Pain Score 09/28/15 0301 10     Pain Loc --      Pain Edu? --      Excl. in [place]? --     Constitutional: Alert and oriented. Well appearing and in no distress. Eyes: Conjunctivae are normal. PERRL. Normal extraocular movements. ENT   Head: Normocephalic and atraumatic.   Nose: No congestion/rhinnorhea.   Mouth/Throat:  Mucous membranes are moist.   Neck: No stridor. Hematological/Lymphatic/Immunilogical: No cervical lymphadenopathy. Cardiovascular: Normal rate, regular rhythm. Normal and symmetric distal pulses are present in all extremities. No murmurs, rubs, or gallops. Respiratory: Normal respiratory effort without tachypnea nor retractions. Diffuse expiratory wheezes on exam  Gastrointestinal: Soft and nontender. No distention. There is no CVA tenderness. Genitourinary: deferred Musculoskeletal: Nontender with normal range of motion in all extremities. No joint effusions.  No lower extremity tenderness nor edema. Neurologic:  Normal speech and language. No gross focal neurologic deficits are appreciated. Speech is normal.  Skin:  Skin is warm, dry and intact. No rash noted. Psychiatric: Mood and affect are normal. Speech and behavior are normal. Patient exhibits appropriate insight and judgment.  ____________________________________________    LABS (pertinent positives/negatives)  Labs Reviewed  BASIC METABOLIC PANEL - Abnormal; Notable for the following:    BUN 43 (*)    Creatinine, Ser 7.32 (*)    Calcium 8.1 (*)    GFR calc non Af Amer 6 (*)    GFR calc Af Amer 6 (*)    All other components within normal limits  CBC - Abnormal; Notable for the following:    RBC 3.41 (*)    Hemoglobin 10.0 (*)    HCT 31.2 (*)    RDW 17.0 (*)    All other components within normal limits  TROPONIN I - Abnormal; Notable for the following:    Troponin I 0.07 (*)    All other components within normal limits     ____________________________________________   EKG  ED ECG REPORT I, Seletha Zimmermann, McKenzie N, the attending physician, personally viewed and interpreted this ECG.   Date: 09/28/2015  EKG Time: 3:01 AM  Rate: 78  Rhythm: Normal Sinus rhythm  Axis: Normal  Intervals: Normal  ST&T Change: None   ____________________________________________    RADIOLOGY  DG Chest Port 1 View (Final  result) Result time: 09/28/15 03:39:46   Final result by Rad Results In Interface (09/28/15 03:39:46)   Narrative:   CLINICAL DATA: Difficulty breathing and chest pressure.  EXAM: PORTABLE CHEST 1 VIEW  COMPARISON: 08/06/2015  FINDINGS: Cardiac enlargement. Mild vascular congestion without edema or consolidation. No blunting of costophrenic angles. No pneumothorax. Surgical clips in the left axilla. Tortuous aorta.  IMPRESSION: Cardiac enlargement. No evidence of active pulmonary disease.   Electronically Signed By: Oren Beckmann.D.  On: 09/28/2015 03:39        _________________   INITIAL IMPRESSION / ASSESSMENT AND PLAN / ED COURSE  Pertinent labs & imaging results that were available during my care of the patient were reviewed by me and considered in my medical decision making (see chart for details). Patient's troponin elevated initially at 0.07 however repeat was less than 0.03 suspect this to be secondary to the patient's renal failure. Patient states that she feels much better following DuoNeb and requesting to be discharged so she could go to dialysis.  ____________________________________________   FINAL CLINICAL IMPRESSION(S) / ED DIAGNOSES  Final diagnoses:  COPD exacerbation ([place])      Gregor Hams, MD 09/28/15 [phone]

## 2015-10-05 ENCOUNTER — Encounter: Payer: Self-pay | Admitting: Emergency Medicine

## 2015-10-05 ENCOUNTER — Emergency Department
Admission: EM | Admit: 2015-10-05 | Discharge: 2015-10-05 | Disposition: A | Discharge status: Home / Self Care | Payer: Medicare Other | Attending: Emergency Medicine | Admitting: Emergency Medicine

## 2015-10-05 ENCOUNTER — Emergency Department: Payer: Medicare Other

## 2015-10-05 DIAGNOSIS — Z992 Dependence on renal dialysis: Secondary | ICD-10-CM | POA: Diagnosis not present

## 2015-10-05 DIAGNOSIS — Z87891 Personal history of nicotine dependence: Secondary | ICD-10-CM | POA: Diagnosis not present

## 2015-10-05 DIAGNOSIS — J441 Chronic obstructive pulmonary disease with (acute) exacerbation: Secondary | ICD-10-CM | POA: Diagnosis not present

## 2015-10-05 DIAGNOSIS — I120 Hypertensive chronic kidney disease with stage 5 chronic kidney disease or end stage renal disease: Secondary | ICD-10-CM | POA: Insufficient documentation

## 2015-10-05 DIAGNOSIS — N186 End stage renal disease: Secondary | ICD-10-CM | POA: Insufficient documentation

## 2015-10-05 DIAGNOSIS — Z79899 Other long term (current) drug therapy: Secondary | ICD-10-CM | POA: Insufficient documentation

## 2015-10-05 DIAGNOSIS — R0602 Shortness of breath: Secondary | ICD-10-CM | POA: Diagnosis present

## 2015-10-05 LAB — BASIC METABOLIC PANEL
ANION GAP: 11 (ref 5–15)
BUN: 63 mg/dL — AB (ref 6–20)
CHLORIDE: 103 mmol/L (ref 101–111)
CO2: 24 mmol/L (ref 22–32)
Calcium: 8 mg/dL — ABNORMAL LOW (ref 8.9–10.3)
Creatinine, Ser: 7.52 mg/dL — ABNORMAL HIGH (ref 0.44–1.00)
GFR, EST AFRICAN AMERICAN: 6 mL/min — AB (ref >60)
GFR, EST NON AFRICAN AMERICAN: 5 mL/min — AB (ref >60)
Glucose, Bld: 103 mg/dL — ABNORMAL HIGH (ref 65–99)
POTASSIUM: 5.3 mmol/L — AB (ref 3.5–5.1)
SODIUM: 138 mmol/L (ref 135–145)

## 2015-10-05 LAB — MAGNESIUM: Magnesium: 1.9 mg/dL (ref 1.7–2.4)

## 2015-10-05 LAB — TROPONIN I: TROPONIN I: 0.03 ng/mL (ref <0.031)

## 2015-10-05 MED ORDER — AZITHROMYCIN 500 MG PO TABS
500.0000 mg | ORAL_TABLET | ORAL | Status: AC
  Administered 2015-10-05: 500 mg via ORAL
  Filled 2015-10-05: qty 1

## 2015-10-05 MED ORDER — IPRATROPIUM-ALBUTEROL 0.5-2.5 (3) MG/3ML IN SOLN
6.0000 mL | Freq: Once | RESPIRATORY_TRACT | Status: AC
  Administered 2015-10-05: 6 mL via RESPIRATORY_TRACT

## 2015-10-05 MED ORDER — IPRATROPIUM-ALBUTEROL 0.5-2.5 (3) MG/3ML IN SOLN
RESPIRATORY_TRACT | Status: AC
Start: 2015-10-05 — End: 2015-10-05
  Administered 2015-10-05: 6 mL via RESPIRATORY_TRACT
  Filled 2015-10-05: qty 6

## 2015-10-05 MED ORDER — AZITHROMYCIN 250 MG PO TABS: ORAL_TABLET | ORAL | Status: DC

## 2015-10-05 MED ORDER — PREDNISONE 20 MG PO TABS: 40.0000 mg | ORAL_TABLET | Freq: Every day | ORAL | Status: DC

## 2015-10-05 MED ORDER — METHYLPREDNISOLONE SODIUM SUCC 125 MG IJ SOLR
INTRAMUSCULAR | Status: AC
  Administered 2015-10-05: 125 mg via INTRAVENOUS
  Filled 2015-10-05: qty 2

## 2015-10-05 MED ORDER — METHYLPREDNISOLONE SODIUM SUCC 125 MG IJ SOLR
125.0000 mg | Freq: Once | INTRAMUSCULAR | Status: AC
  Administered 2015-10-05: 125 mg via INTRAVENOUS

## 2015-10-05 NOTE — ED Notes (Addendum)
Pt to rm 19 via EMS from dialysis.  Pt had not started dialysis yet.  Pt reports cough/wheezing x 2 days, hx COPD.  EMS applied 2L [place] and gave one duoneb.  Pt reports central chest pain 5/10 r/t coughing.  Pt NAD at this time, respirations mildly labored, skin warm and dry.

## 2015-10-05 NOTE — ED Provider Notes (Addendum)
[facility] Emergency Department Provider Note  ____________________________________________  Time seen: Approximately 6:25 AM  I have reviewed the triage vital signs and the nursing notes.   HISTORY  Chief Complaint Shortness of Breath and Cough    HPI Jasmine Holloway is a 55 y.o. female who presents by EMS for evaluation of acute shortness of breath.  She states that she has been coughing and wheezing more than usual over the last 2 days with a gradual onset in symptoms.  She does have a history of COPD.  She went to dialysis this morning and was wheezing and short of breath enough that they called EMS to transport her here.  She has not had a significant amount of swelling in her legs.  She describes some mild to moderate heaviness in her chest that she always feels when she is short of breath.  She denies N/V, abd pain, fever/chills, headache.  Exertion makes her symptoms worse; they started feeling better en route when she got a DuoNeb.    Past Medical History  Diagnosis Date  . ESRD (end stage renal disease) on dialysis ([place])   . Hypertension   . Polycystic kidney disease   . Asthma   . COPD (chronic obstructive pulmonary disease) [facility])     Patient Active Problem List   Diagnosis Date Noted  . Chest pain 02/24/2015    Past Surgical History  Procedure Laterality Date  . Arteriovenous graft placement Right     x3 (R forearm currently used for access)  . Vein harvest      Current Outpatient Rx  Name  Route  Sig  Dispense  Refill  . b complex-vitamin c-folic acid (NEPHRO-VITE) 0.8 MG TABS tablet   Oral   Take 1 tablet by mouth at bedtime.         . calcium acetate (PHOSLO) 667 MG capsule   Oral   Take by mouth 3 (three) times daily with meals.         . cinacalcet (SENSIPAR) 30 MG tablet   Oral   Take 30 mg by mouth daily.         Marland Kitchen gabapentin (NEURONTIN) 300 MG capsule   Oral   Take 300 mg by mouth 3 (three) times daily.          . GuaiFENesin (MUCINEX PO)   Oral   Take by mouth.         . metoprolol succinate (TOPROL-XL) 50 MG 24 hr tablet   Oral   Take 50 mg by mouth daily. Take with or immediately following a meal.         . omeprazole (PRILOSEC) 20 MG capsule   Oral   Take 20 mg by mouth daily.         Marland Kitchen oxyCODONE-acetaminophen (PERCOCET) 10-325 MG tablet   Oral   Take 1 tablet by mouth daily as needed.      0   . rosuvastatin (CRESTOR) 5 MG tablet   Oral   Take 5 mg by mouth daily.         Marland Kitchen azithromycin (ZITHROMAX) 250 MG tablet      Take 1 tablet PO daily for 4 more days starting tomorrow (10/06/15)   4 each   0   . brompheniramine-pseudoephedrine-DM 30-2-10 MG/5ML syrup   Oral   Take 5 mLs by mouth 4 (four) times daily as needed.   120 mL   0   . predniSONE (DELTASONE) 20 MG tablet  Oral   Take 2 tablets (40 mg total) by mouth daily.   10 tablet   0     Allergies Morphine and related and Vicodin  Family History  Problem Relation Age of Onset  . Hypertension Brother     Social History Social History  Substance Use Topics  . Smoking status: Former Research scientist (life sciences)  . Smokeless tobacco: None  . Alcohol Use: No  Stopped smoking about 2 months ago  Review of Systems Constitutional: No fever/chills Eyes: No visual changes. ENT: No sore throat. Cardiovascular: Central chest pressure/heaviness when SOB Respiratory: gradual onset worsening SOB for about 2 days Gastrointestinal: No abdominal pain.  No nausea, no vomiting.  No diarrhea.  No constipation. Genitourinary: Negative for dysuria. Musculoskeletal: Negative for back pain. Skin: Negative for rash. Neurological: Negative for headaches, focal weakness or numbness.  10-point ROS otherwise negative.  ____________________________________________   PHYSICAL EXAM:  VITAL SIGNS: ED Triage Vitals  Enc Vitals Group     BP 10/05/15 0623 180/104 mmHg     Pulse Rate 10/05/15 0623 84     Resp 10/05/15 0623 24     Temp  10/05/15 0623 97.9 F (36.6 C)     Temp Source 10/05/15 0623 Oral     SpO2 10/05/15 0623 95 %     Weight 10/05/15 0623 188 lb (85.276 kg)     Height 10/05/15 0623 5\' 3"  (1.6 m)     Head Cir --      Peak Flow --      Pain Score --      Pain Loc --      Pain Edu? --      Excl. in [place]? --     Constitutional: Alert and oriented. Mild distress, anxious, talking rapidly and with no difficulty Eyes: Conjunctivae are normal. PERRL. EOMI. Head: Atraumatic. Nose: No congestion/rhinnorhea. Mouth/Throat: Mucous membranes are moist.  Oropharynx non-erythematous. Neck: No stridor.   Cardiovascular: Normal rate, regular rhythm. Grossly normal heart sounds.  Good peripheral circulation. Respiratory: Slightly increased respiratory effort.  Mild bilateral wheezing. Gastrointestinal: Soft and nontender. No distention. No abdominal bruits. No CVA tenderness. Musculoskeletal: No lower extremity tenderness nor edema.  No joint effusions. Neurologic:  Normal speech and language. No gross focal neurologic deficits are appreciated.  Skin:  Skin is warm, dry and intact. No rash noted. Psychiatric: Mood and affect are slightly anxious but generally normal   ____________________________________________   LABS (all labs ordered are listed, but only abnormal results are displayed)  Labs Reviewed  BASIC METABOLIC PANEL - Abnormal; Notable for the following:    Potassium 5.3 (*)    Glucose, Bld 103 (*)    BUN 63 (*)    Creatinine, Ser 7.52 (*)    Calcium 8.0 (*)    GFR calc non Af Amer 5 (*)    GFR calc Af Amer 6 (*)    All other components within normal limits  MAGNESIUM  TROPONIN I   ____________________________________________  EKG  ED ECG REPORT I, Belen Pesch, the attending physician, personally viewed and interpreted this ECG.  Date: 10/05/2015 EKG Time: 06:25 Rate: 79 Rhythm: normal sinus rhythm QRS Axis: normal Intervals: normal ST/T Wave abnormalities: inverted T waves in III,  V6 Conduction Disturbances: none Narrative Interpretation: Non-specific ST segment / T-wave changes, but no evidence of acute ischemia.   ____________________________________________  RADIOLOGY   Dg Chest Portable 1 View  10/05/2015  CLINICAL DATA:  Acute onset of shortness of breath and left-sided chest pain. Initial  encounter. EXAM: PORTABLE CHEST 1 VIEW COMPARISON:  Chest radiograph performed 09/28/2015 FINDINGS: The lungs are well-aerated six. Vascular congestion is noted. Bibasilar airspace opacities may reflect mild interstitial edema or possibly pneumonia. Small bilateral pleural effusions are suggested. No pneumothorax is seen. The cardiomediastinal silhouette is borderline normal in size. No acute osseous abnormalities are seen. Clips are seen overlying the left axilla. IMPRESSION: Vascular congestion noted. Bibasilar airspace opacities may reflect mild interstitial edema or possibly pneumonia. Suggestion of small bilateral pleural effusions. Electronically Signed   By: Garald Balding M.D.   On: 10/05/2015 06:48    ____________________________________________   PROCEDURES  Procedure(s) performed: None  Critical Care performed: No ____________________________________________   INITIAL IMPRESSION / ASSESSMENT AND PLAN / ED COURSE  Pertinent labs & imaging results that were available during my care of the patient were reviewed by me and considered in my medical decision making (see chart for details).  In spite of her mild respiratory distress the patient is talking rapidly and easily and frequently.  She improved after a DuoNeb and does have a history of COPD.  Given that she needs to be in dialysis I will perform a targeted evaluation of her electrolytes, chest x-ray, troponin given her complaint of chest tightness (although she has had a slightly elevated troponin in the past), and we will treat for COPD exacerbation.  She has had a similar presentation in the past which was  greatly helped with DuoNeb labs.  Anticipate discharge with outpatient follow-up and rapid return to dialysis.  I strongly doubt that the bibasilar atelectasis seen by the radiologist on the x-ray represents community-acquired pneumonia, but just to be safe I gave her a first dose of azithromycin here in the emergency department and a prescription for 4 more days.  ----------------------------------------- 7:18 AM on 10/05/2015 -----------------------------------------  Patient sleeping comfortably with 100% SPO2 and normal work of breathing.  On reexamination the wheezing has resolved.  I advised her that I think she is appropriate for outpatient follow-up and treatment for COPD and she understands and agrees and is ready for discharge to the dialysis center. ____________________________________________  FINAL CLINICAL IMPRESSION(S) / ED DIAGNOSES  Final diagnoses:  Acute exacerbation of chronic obstructive pulmonary disease (COPD) ([place])      NEW MEDICATIONS STARTED DURING THIS VISIT:  New Prescriptions   AZITHROMYCIN (ZITHROMAX) 250 MG TABLET    Take 1 tablet PO daily for 4 more days starting tomorrow (10/06/15)   PREDNISONE (DELTASONE) 20 MG TABLET    Take 2 tablets (40 mg total) by mouth daily.      Please note that this document was prepared using voice recognition software and may include unintentional dictation errors.   Hinda Kehr, MD 10/05/15 [id number]  Hinda Kehr, MD 10/05/15 [phone]

## 2015-10-05 NOTE — Discharge Instructions (Signed)
We believe that your symptoms are caused today by an exacerbation of your COPD, and possibly bronchitis.  Please take the prescribed medications and any medications that you have at home for your COPD.  Follow up with your doctor as recommended.  If you develop any new or worsening symptoms, including but not limited to fever, persistent vomiting, worsening shortness of breath, or other symptoms that concern you, please return to the Emergency Department immediately. ° ° °Chronic Obstructive Pulmonary Disease °Chronic obstructive pulmonary disease (COPD) is a common lung condition in which airflow from the lungs is limited. COPD is a general term that can be used to describe many different lung problems that limit airflow, including both chronic bronchitis and emphysema.  If you have COPD, your lung function will probably never return to normal, but there are measures you can take to improve lung function and make yourself feel better.  °CAUSES  °· Smoking (common).   °· Exposure to secondhand smoke.   °· Genetic problems. °· Chronic inflammatory lung diseases or recurrent infections. °SYMPTOMS  °· Shortness of breath, especially with physical activity.   °· Deep, persistent (chronic) cough with a large amount of thick mucus.   °· Wheezing.   °· Rapid breaths (tachypnea).   °· Gray or bluish discoloration (cyanosis) of the skin, especially in fingers, toes, or lips.   °· Fatigue.   °· Weight loss.   °· Frequent infections or episodes when breathing symptoms become much worse (exacerbations).   °· Chest tightness. °DIAGNOSIS  °Your health care provider will take a medical history and perform a physical examination to make the initial diagnosis.  Additional tests for COPD may include:  °· Lung (pulmonary) function tests. °· Chest X-ray. °· CT scan. °· Blood tests. °TREATMENT  °Treatment available to help you feel better when you have COPD includes:  °· Inhaler and nebulizer medicines. These help manage the symptoms of  COPD and make your breathing more comfortable. °· Supplemental oxygen. Supplemental oxygen is only helpful if you have a low oxygen level in your blood.   °· Exercise and physical activity. These are beneficial for nearly all people with COPD. Some people may also benefit from a pulmonary rehabilitation program. °HOME CARE INSTRUCTIONS  °· Take all medicines (inhaled or pills) as directed by your health care provider. °· Avoid over-the-counter medicines or cough syrups that dry up your airway (such as antihistamines) and slow down the elimination of secretions unless instructed otherwise by your health care provider.   °· If you are a smoker, the most important thing that you can do is stop smoking. Continuing to smoke will cause further lung damage and breathing trouble. Ask your health care provider for help with quitting smoking. He or she can direct you to community resources or hospitals that provide support. °· Avoid exposure to irritants such as smoke, chemicals, and fumes that aggravate your breathing. °· Use oxygen therapy and pulmonary rehabilitation if directed by your health care provider. If you require home oxygen therapy, ask your health care provider whether you should purchase a pulse oximeter to measure your oxygen level at home.   °· Avoid contact with individuals who have a contagious illness. °· Avoid extreme temperature and humidity changes. °· Eat healthy foods. Eating smaller, more frequent meals and resting before meals may help you maintain your strength. °· Stay active, but balance activity with periods of rest. Exercise and physical activity will help you maintain your ability to do things you want to do. °· Preventing infection and hospitalization is very important when you have COPD. Make sure   to receive all the vaccines your health care provider recommends, especially the pneumococcal and influenza vaccines. Ask your health care provider whether you need a pneumonia vaccine. °· Learn  and use relaxation techniques to manage stress. °· Learn and use controlled breathing techniques as directed by your health care provider. Controlled breathing techniques include:   °¨ Pursed lip breathing. Start by breathing in (inhaling) through your nose for 1 second. Then, purse your lips as if you were going to whistle and breathe out (exhale) through the pursed lips for 2 seconds.   °¨ Diaphragmatic breathing. Start by putting one hand on your abdomen just above your waist. Inhale slowly through your nose. The hand on your abdomen should move out. Then purse your lips and exhale slowly. You should be able to feel the hand on your abdomen moving in as you exhale.   °· Learn and use controlled coughing to clear mucus from your lungs. Controlled coughing is a series of short, progressive coughs. The steps of controlled coughing are:   °1. Lean your head slightly forward.   °2. Breathe in deeply using diaphragmatic breathing.   °3. Try to hold your breath for 3 seconds.   °4. Keep your mouth slightly open while coughing twice.   °5. Spit any mucus out into a tissue.   °6. Rest and repeat the steps once or twice as needed. °SEEK MEDICAL CARE IF:  °· You are coughing up more mucus than usual.   °· There is a change in the color or thickness of your mucus.   °· Your breathing is more labored than usual.   °· Your breathing is faster than usual.   °SEEK IMMEDIATE MEDICAL CARE IF:  °· You have shortness of breath while you are resting.   °· You have shortness of breath that prevents you from: °¨ Being able to talk.   °¨ Performing your usual physical activities.   °· You have chest pain lasting longer than 5 minutes.   °· Your skin color is more cyanotic than usual. °· You measure low oxygen saturations for longer than 5 minutes with a pulse oximeter. °MAKE SURE YOU:  °· Understand these instructions. °· Will watch your condition. °· Will get help right away if you are not doing well or get worse. °Document Released:  05/06/2005 Document Revised: 12/11/2013 Document Reviewed: 03/23/2013 °[employer]® Patient Information ©2015 [employer]. This information is not intended to replace advice given to you by your health care provider. Make sure you discuss any questions you have with your health care provider. ° ° ° °

## 2015-10-12 ENCOUNTER — Emergency Department
Admission: EM | Admit: 2015-10-12 | Discharge: 2015-10-12 | Disposition: A | Discharge status: Home / Self Care | Payer: Medicare Other | Attending: Emergency Medicine | Admitting: Emergency Medicine

## 2015-10-12 ENCOUNTER — Emergency Department: Payer: Medicare Other

## 2015-10-12 ENCOUNTER — Encounter: Payer: Self-pay | Admitting: Emergency Medicine

## 2015-10-12 DIAGNOSIS — Z87891 Personal history of nicotine dependence: Secondary | ICD-10-CM | POA: Insufficient documentation

## 2015-10-12 DIAGNOSIS — J441 Chronic obstructive pulmonary disease with (acute) exacerbation: Secondary | ICD-10-CM | POA: Diagnosis not present

## 2015-10-12 DIAGNOSIS — I120 Hypertensive chronic kidney disease with stage 5 chronic kidney disease or end stage renal disease: Secondary | ICD-10-CM | POA: Diagnosis not present

## 2015-10-12 DIAGNOSIS — J449 Chronic obstructive pulmonary disease, unspecified: Secondary | ICD-10-CM

## 2015-10-12 DIAGNOSIS — R51 Headache: Secondary | ICD-10-CM | POA: Diagnosis present

## 2015-10-12 DIAGNOSIS — J810 Acute pulmonary edema: Secondary | ICD-10-CM | POA: Insufficient documentation

## 2015-10-12 DIAGNOSIS — E119 Type 2 diabetes mellitus without complications: Secondary | ICD-10-CM | POA: Insufficient documentation

## 2015-10-12 DIAGNOSIS — G43909 Migraine, unspecified, not intractable, without status migrainosus: Secondary | ICD-10-CM | POA: Insufficient documentation

## 2015-10-12 DIAGNOSIS — Z79899 Other long term (current) drug therapy: Secondary | ICD-10-CM | POA: Insufficient documentation

## 2015-10-12 DIAGNOSIS — Z992 Dependence on renal dialysis: Secondary | ICD-10-CM | POA: Insufficient documentation

## 2015-10-12 DIAGNOSIS — R609 Edema, unspecified: Secondary | ICD-10-CM

## 2015-10-12 DIAGNOSIS — R0602 Shortness of breath: Secondary | ICD-10-CM | POA: Diagnosis present

## 2015-10-12 DIAGNOSIS — F419 Anxiety disorder, unspecified: Secondary | ICD-10-CM | POA: Insufficient documentation

## 2015-10-12 DIAGNOSIS — Z7952 Long term (current) use of systemic steroids: Secondary | ICD-10-CM | POA: Diagnosis not present

## 2015-10-12 DIAGNOSIS — N186 End stage renal disease: Secondary | ICD-10-CM | POA: Insufficient documentation

## 2015-10-12 HISTORY — DX: Polyneuropathy, unspecified: G62.9

## 2015-10-12 HISTORY — DX: Type 2 diabetes mellitus without complications: E11.9

## 2015-10-12 LAB — CBC WITH DIFFERENTIAL/PLATELET
Basophils Absolute: 0.1 10*3/uL (ref 0–0.1)
Basophils Relative: 1 %
Eosinophils Absolute: 0.8 10*3/uL — ABNORMAL HIGH (ref 0–0.7)
Eosinophils Relative: 7 %
HCT: 31.2 % — ABNORMAL LOW (ref 35.0–47.0)
Hemoglobin: 10.3 g/dL — ABNORMAL LOW (ref 12.0–16.0)
Lymphocytes Relative: 20 %
Lymphs Abs: 2.4 10*3/uL (ref 1.0–3.6)
MCH: 30.1 pg (ref 26.0–34.0)
MCHC: 32.9 g/dL (ref 32.0–36.0)
MCV: 91.4 fL (ref 80.0–100.0)
Monocytes Absolute: 0.9 10*3/uL (ref 0.2–0.9)
Monocytes Relative: 8 %
Neutro Abs: 7.6 10*3/uL — ABNORMAL HIGH (ref 1.4–6.5)
Neutrophils Relative %: 64 %
Platelets: 257 10*3/uL (ref 150–440)
RBC: 3.42 MIL/uL — ABNORMAL LOW (ref 3.80–5.20)
RDW: 18.3 % — ABNORMAL HIGH (ref 11.5–14.5)
WBC: 11.9 10*3/uL — ABNORMAL HIGH (ref 3.6–11.0)

## 2015-10-12 LAB — COMPREHENSIVE METABOLIC PANEL
ALBUMIN: 3.7 g/dL (ref 3.5–5.0)
ALT: 43 U/L (ref 14–54)
AST: 21 U/L (ref 15–41)
Alkaline Phosphatase: 61 U/L (ref 38–126)
Anion gap: 11 (ref 5–15)
BUN: 69 mg/dL — ABNORMAL HIGH (ref 6–20)
CALCIUM: 7.2 mg/dL — AB (ref 8.9–10.3)
CHLORIDE: 106 mmol/L (ref 101–111)
CO2: 23 mmol/L (ref 22–32)
CREATININE: 7.86 mg/dL — AB (ref 0.44–1.00)
GFR, EST AFRICAN AMERICAN: 6 mL/min — AB (ref >60)
GFR, EST NON AFRICAN AMERICAN: 5 mL/min — AB (ref >60)
Glucose, Bld: 93 mg/dL (ref 65–99)
Potassium: 5.3 mmol/L — ABNORMAL HIGH (ref 3.5–5.1)
SODIUM: 140 mmol/L (ref 135–145)
TOTAL PROTEIN: 7.1 g/dL (ref 6.5–8.1)
Total Bilirubin: 0.7 mg/dL (ref 0.3–1.2)

## 2015-10-12 LAB — BRAIN NATRIURETIC PEPTIDE: B NATRIURETIC PEPTIDE 5: 714 pg/mL — AB (ref 0.0–100.0)

## 2015-10-12 LAB — MAGNESIUM: MAGNESIUM: 1.8 mg/dL (ref 1.7–2.4)

## 2015-10-12 LAB — TROPONIN I: Troponin I: 0.06 ng/mL — ABNORMAL HIGH (ref <0.031)

## 2015-10-12 MED ORDER — DIAZEPAM 5 MG PO TABS
5.0000 mg | ORAL_TABLET | Freq: Once | ORAL | Status: AC
  Administered 2015-10-12: 5 mg via ORAL
  Filled 2015-10-12: qty 1

## 2015-10-12 MED ORDER — KETOROLAC TROMETHAMINE 60 MG/2ML IM SOLN
30.0000 mg | Freq: Once | INTRAMUSCULAR | Status: AC
  Administered 2015-10-12: 30 mg via INTRAMUSCULAR
  Filled 2015-10-12: qty 2

## 2015-10-12 MED ORDER — ONDANSETRON 4 MG PO TBDP: 4.0000 mg | ORAL_TABLET | Freq: Once | ORAL | Status: DC

## 2015-10-12 MED ORDER — DIAZEPAM 5 MG/ML IJ SOLN: 2.0000 mg | Freq: Once | INTRAMUSCULAR | Status: DC

## 2015-10-12 MED ORDER — PROMETHAZINE HCL 25 MG/ML IJ SOLN
12.5000 mg | Freq: Once | INTRAMUSCULAR | Status: AC
  Administered 2015-10-12: 12.5 mg via INTRAMUSCULAR
  Filled 2015-10-12: qty 1

## 2015-10-12 NOTE — ED Notes (Signed)
MD Forbach at bedside. 

## 2015-10-12 NOTE — ED Notes (Signed)
Per EMS: pt c/o of SOB and nausea. Pt dx with bronchitis, and given 2 rounds of prednisone. PT was given 125 solumedrol, and 1 duoneb treatment. Pt sats 98% on RA. Expiratory wheezes all fields.   Pt c/o of nausea, SOB, dyspnea at rest/exertion.

## 2015-10-12 NOTE — Discharge Instructions (Signed)

## 2015-10-12 NOTE — ED Notes (Signed)
Pt reports bilateral lower extremity edema X 2 weeks. +1 pitting edema observed bilaterally

## 2015-10-12 NOTE — ED Notes (Signed)
Called pts dialysis center to let them know pt will be coming to dialysis.

## 2015-10-12 NOTE — ED Notes (Signed)
Pt d/c at this time, went to visit another family member in another room within this department with other family members. Pt stable, NAD noted.

## 2015-10-12 NOTE — ED Notes (Signed)
Pt sleeping soundly at this time, symmetrical chest rise and fall noted, NAD noted.

## 2015-10-12 NOTE — ED Notes (Signed)
Pt on tues/thurs/sat dialysis schedule. Pt due for treatment this am.

## 2015-10-12 NOTE — ED Notes (Signed)
Pt reports reports headache that started today will at dialysis pt moaning and crying. Hx of migraine. States she could not finish dialysis due to pain. Frontal, temporal and back of neck is where the pain is located along light sensivity and nausea

## 2015-10-12 NOTE — ED Notes (Signed)
Pt will be d/c once d/c papers are placed.

## 2015-10-12 NOTE — Discharge Instructions (Signed)
Your workup was reassuring today, and your symptoms appear to again be the result of both your COPD and slightly too much fluid on your fluids and legs as a result of your kidney failure.  The treatment is dialysis, so we are discharging you to go back to the dialysis center.  You were sleeping comfortably in the Emergency Department and should be safe to receive dialysis this morning.  Follow up with your kidney doctor at the next available opportunity to discuss your current dialysis regimen and whether or not any adjustments need to be made.   End-Stage Kidney Disease The kidneys are two organs that lie on either side of the spine between the middle of the back and the front of the abdomen. The kidneys:   Remove wastes and extra water from the blood.   Produce important hormones. These help keep bones strong, regulate blood pressure, and help create red blood cells.   Balance the fluids and chemicals in the blood and tissues. End-stage kidney disease occurs when the kidneys are so damaged that they cannot do their job. When the kidneys cannot do their job, life-threatening problems occur. The body cannot stay clean and strong without the help of the kidneys. In end-stage kidney disease, the kidneys cannot get better.You need a new kidney or treatments to do some of the work healthy kidneys do in order to stay alive. CAUSES  End-stage kidney disease usually occurs when a long-lasting (chronic) kidney disease gets worse. It may also occur after the kidneys are suddenly damaged (acute kidney injury).  SYMPTOMS   Swelling (edema) of the legs, ankles, or feet.   Tiredness (lethargy).   Nausea or vomiting.   Confusion.   Problems with urination, such as:   Decreased urine production.   Frequent urination, especially at night.   Frequent accidents in children who are potty trained.   Muscle twitches and cramps.   Persistent itchiness.   Loss of appetite.   Headaches.    Abnormally dark or light skin.   Numbness in the hands or feet.   Easy bruising.   Frequent hiccups.   Menstruation stops. DIAGNOSIS  Your health care provider will measure your blood pressure and take some tests. These may include:   Urine tests.   Blood tests.   Imaging tests, such as:   An ultrasound exam.   Computed tomography (CT).  A kidney biopsy. TREATMENT  There are two treatments for end-stage kidney disease:   A procedure that removes toxic wastes from the body (dialysis).   Receiving a new kidney (kidney transplant). Both of these treatments have serious risks and consequences. Your health care provider will help you determine which treatment is best for you based on your health, age, and other factors. In addition to having dialysis or a kidney transplant, you may need to take medicines to control high blood pressure (hypertension) and cholesterol and to decrease phosphorus levels in your blood.  HOME CARE INSTRUCTIONS  Follow your prescribed diet.   Take medicines only as directed by your health care provider.   Do not take any new medicines (prescription, over-the-counter, or nutritional supplements) unless approved by your health care provider. Many medicines can worsen your kidney damage or need to have the dose adjusted.   Keep all follow-up visits as directed by your health care provider. MAKE SURE YOU:  Understand these instructions.  Will watch your condition.  Will get help right away if you are not doing well or get worse.  This information is not intended to replace advice given to you by your health care provider. Make sure you discuss any questions you have with your health care provider.   Document Released: 10/17/2003 Document Revised: 08/17/2014 Document Reviewed: 03/25/2012 Elsevier Interactive Patient Education [employer].  Hemodialysis Dialysis is a procedure that replaces some of the work that healthy  kidneys do. It is done when you lose about 85-90% of your kidney function and sometimes earlier if your symptoms may be improved by dialysis. During dialysis, wastes, salt, and extra water are removed from the blood, and the level of certain chemicals in the blood (such as potassium) is maintained. Hemodialysis is a type of dialysis in which a machine called a dialyzer is used to filter the blood. Hemodialysis is usually performed by a health care provider at a hospital or dialysis center 3 times a week for 3-5 hours during each visit. It may also be performed more frequently and for longer periods of time at home with training. LET [facility] CARE PROVIDER KNOW ABOUT:  Any allergies you have.  All medicines you are taking, including vitamins, herbs, eye drops, creams, and over-the-counter medicines.  Any blood disorders you have.  Other health problems you have. RISKS AND COMPLICATIONS Generally, hemodialysis is a safe procedure. However, problems can occur and include:  Low blood pressure (hypotension).  Narrowing or ballooning of a blood vessel or bleeding at the site where blood is removed from the body and returned to the body (vascular access).  An infection or blockage at the vascular access site. BEFORE THE PROCEDURE Before having hemodialysis for the first time, you will need surgery to create a site where blood can be removed from the body and returned to the body (vascular access). There are three types of vascular accesses:  Arteriovenous fistula. This type of access is created when an artery and a vein (usually in the arm) are connected during surgery. The arteriovenous fistula takes 1-6 months to develop after surgery.  Arteriovenous graft. This type of access is created when an artery and a vein in the arm are connected during surgery with a tube. An arteriovenous graft can be used within 2-3 weeks of surgery.  A venous catheter. To create this type of access, a thin,  flexible tube (catheter) is placed in a large vein in your neck, chest, or groin. A venous catheter can be used right away. PROCEDURE  Hemodialysis is performed while you are sitting or reclining. During the procedure, you may sleep, read, watch television, or perform any other tasks that can be done while you are in this position. If you experience side effects or any other discomfort during the procedure, let your health care provider know. He or she may be able to make adjustments to help you feel more comfortable. Your hemodialysis process may look something like this:  Your weight, blood pressure, pulse, and temperature will be measured.  The skin around your vascular access will be sterilized.  Your vascular access will be connected to the dialyzer. If your access is a fistula or graft, two needles will be inserted through the vascular access. The needles will be connected to a plastic tube that is connected to the dialyzer. They will be taped to your skin so that they do not move out of place. If your access is a catheter, it will be connected to a plastic tube that is connected to the dialyzer.  The dialysis machine will be turned on. This will  cause your blood to flow to the dialyzer. The dialyzer will filter and clean your blood before returning it to your body. While the dialysis machine is working, your blood pressure and pulse will be checked several times.  Once dialysis is complete, your vascular access will be disconnected from the dialyzer. If your access is a fistula or graft, the needles will be removed and a bandage (dressing) will be placed over the access. AFTER THE PROCEDURE  Your weight will be measured.  Your blood may be tested to see whether your treatments are removing enough wastes. This is usually done once a month.  You may experience or continue to experience side effects, including:  Dizziness.  Muscle cramps.  Nausea.  Headaches.  Feeling tired. Energy  levels usually return to normal the day after the procedure.  Itchiness. Your health care provider may be able to prescribe medicine to relieve it.  Achy or jittery legs. You may feel like kicking your legs. This sometimes causes sleeping problems.  Allergic reaction to the chemicals used to sterilize the dialyzer.   This information is not intended to replace advice given to you by your health care provider. Make sure you discuss any questions you have with your health care provider.   Document Released: 11/21/2012 Document Revised: 08/17/2014 Document Reviewed: 11/21/2012 Elsevier Interactive Patient Education 2016 Elsevier Inc[employer]  Peripheral Edema You have swelling in your legs (peripheral edema). This swelling is due to excess accumulation of salt and water in your body. Edema may be a sign of heart, kidney or liver disease, or a side effect of a medication. It may also be due to problems in the leg veins. Elevating your legs and using special support stockings may be very helpful, if the cause of the swelling is due to poor venous circulation. Avoid long periods of standing, whatever the cause. Treatment of edema depends on identifying the cause. Chips, pretzels, pickles and other salty foods should be avoided. Restricting salt in your diet is almost always needed. Water pills (diuretics) are often used to remove the excess salt and water from your body via urine. These medicines prevent the kidney from reabsorbing sodium. This increases urine flow. Diuretic treatment may also result in lowering of potassium levels in your body. Potassium supplements may be needed if you have to use diuretics daily. Daily weights can help you keep track of your progress in clearing your edema. You should call your caregiver for follow up care as recommended. SEEK IMMEDIATE MEDICAL CARE IF:   You have increased swelling, pain, redness, or heat in your legs.  You develop shortness of breath, especially when  lying down.  You develop chest or abdominal pain, weakness, or fainting.  You have a fever.   This information is not intended to replace advice given to you by your health care provider. Make sure you discuss any questions you have with your health care provider.   Document Released: 09/03/2004 Document Revised: 10/19/2011 Document Reviewed: 02/06/2015 Elsevier Interactive Patient Education 2016 Elsevier Inc.  Chronic Bronchitis Chronic bronchitis is a lasting inflammation of the bronchial tubes, which are the tubes that carry air into your lungs. This is inflammation that occurs:   On most days of the week.   For at least three months at a time.   Over a period of two years in a row. When the bronchial tubes are inflamed, they start to produce mucus. The inflammation and buildup of mucus make it more difficult to breathe. Chronic  bronchitis is usually a permanent problem and is one type of chronic obstructive pulmonary disease (COPD). People with chronic bronchitis are at greater risk for getting repeated colds, or respiratory infections. CAUSES  Chronic bronchitis most often occurs in people who have:  Long-standing, severe asthma.  A history of smoking.  Asthma and who also smoke. SIGNS AND SYMPTOMS  Chronic bronchitis may cause the following:   A cough that brings up mucus (productive cough).  Shortness of breath.  Early morning headache.  Wheezing.  Chest discomfort.   Recurring respiratory infections. DIAGNOSIS  Your health care provider may confirm the diagnosis by:  Taking your medical history.  Performing a physical exam.  Taking a chest X-ray.   Performing pulmonary function tests. TREATMENT  Treatment involves controlling symptoms with medicines, oxygen therapy, or making lifestyle changes, such as exercising and eating a healthy, well-balanced diet. Medicines could include:  Inhalers to improve air flow in and out of your lungs.  Antibiotics  to treat bacterial infections, such as pneumonia, sinus infections, and acute bronchitis. As a preventative measure, your health care provider may recommend routine vaccinations for influenza and pneumonia. This is to prevent infection and hospitalization since you may be more at risk for these types of infections.  HOME CARE INSTRUCTIONS  Take medicines only as directed by your health care provider.   If you smoke cigarettes, chew tobacco, or use electronic cigarettes, quit. If you need help quitting, ask your health care provider.  Avoid pollen, dust, animal dander, molds, smoke, and other things that cause shortness of breath or wheezing attacks.  Talk to your health care provider about possible exercise routines. Regular exercise is very important to help you feel better.  If you are prescribed oxygen use at home follow these guidelines:  Never smoke while using oxygen. Oxygen does not burn or explode, but flammable materials will burn faster in the presence of oxygen.  Keep a Data processing manager close by. Let your fire department know that you have oxygen in your home.  Warn visitors not to smoke near you when you are using oxygen. Put up "no smoking" signs in your home where you most often use the oxygen.  Regularly test your smoke detectors at home to make sure they work. If you receive care in your home from a nurse or other health care provider, he or she may also check to make sure your smoke detectors work.  Ask your health care provider whether you would benefit from a pulmonary rehabilitation program.  Do not wait to get medical care if you have any concerning symptoms. Delays could cause permanent injury and may be life threatening. SEEK MEDICAL CARE IF:  You have increased coughing or shortness of breath or both.  You have muscle aches.  You have chest pain.  Your mucus gets thicker.  Your mucus changes from clear or white to yellow, green, gray, or bloody. SEEK  IMMEDIATE MEDICAL CARE IF:  Your usual medicines do not stop your wheezing.   You have increased difficulty breathing.   You have any problems with the medicine you are taking, such as a rash, itching, swelling, or trouble breathing. MAKE SURE YOU:   Understand these instructions.  Will watch your condition.  Will get help right away if you are not doing well or get worse.   This information is not intended to replace advice given to you by your health care provider. Make sure you discuss any questions you have with your health care  provider.   Document Released: 05/14/2006 Document Revised: 08/17/2014 Document Reviewed: 09/04/2013 Elsevier Interactive Patient Education [employer].

## 2015-10-12 NOTE — ED Provider Notes (Signed)
[facility] Emergency Department Provider Note  Time seen: 3:09 PM  I have reviewed the triage vital signs and the nursing notes.   HISTORY  Chief Complaint Migraine    HPI Jasmine Holloway is a 55 y.o. female with a past medical history of hypertension, asthma, COPD, diabetes, end-stage renal disease on dialysis, presents the emergency department with a headache. According to the patient she was receiving dialysis when she again experiencing a mild headache. She states it progressed through her dialysis session and turned into a migraine per patient. Patient states a long history of migraine headaches for which she sees pain clinic, also states she receives steroid injections in her head and neck for her headaches. Patient states this feels like her typical migraine, but states it is been several months since she has had one this bad. Denies any focal weakness or numbness. Denies any neck pain or fever. Patient is in moderate distress, moaning saying that her head hurts and she feels nauseous.     Past Medical History  Diagnosis Date  . ESRD (end stage renal disease) on dialysis ([place])   . Hypertension   . Polycystic kidney disease   . Asthma   . COPD (chronic obstructive pulmonary disease) ([place])   . Diabetes mellitus without complication ([place])   . Neuropathy [facility])     Patient Active Problem List   Diagnosis Date Noted  . Chest pain 02/24/2015    Past Surgical History  Procedure Laterality Date  . Arteriovenous graft placement Right     x3 (R forearm currently used for access)  . Vein harvest      Current Outpatient Rx  Name  Route  Sig  Dispense  Refill  . azithromycin (ZITHROMAX) 250 MG tablet      Take 1 tablet PO daily for 4 more days starting tomorrow (10/06/15)   4 each   0   . b complex-vitamin c-folic acid (NEPHRO-VITE) 0.8 MG TABS tablet   Oral   Take 1 tablet by mouth at bedtime.         . brompheniramine-pseudoephedrine-DM 30-2-10  MG/5ML syrup   Oral   Take 5 mLs by mouth 4 (four) times daily as needed.   120 mL   0   . calcium acetate (PHOSLO) 667 MG capsule   Oral   Take by mouth 3 (three) times daily with meals.         . cinacalcet (SENSIPAR) 30 MG tablet   Oral   Take 30 mg by mouth daily.         Marland Kitchen gabapentin (NEURONTIN) 300 MG capsule   Oral   Take 300 mg by mouth 3 (three) times daily.         . GuaiFENesin (MUCINEX PO)   Oral   Take by mouth.         . metoprolol succinate (TOPROL-XL) 50 MG 24 hr tablet   Oral   Take 50 mg by mouth daily. Take with or immediately following a meal.         . omeprazole (PRILOSEC) 20 MG capsule   Oral   Take 20 mg by mouth daily.         Marland Kitchen oxyCODONE-acetaminophen (PERCOCET) 10-325 MG tablet   Oral   Take 1 tablet by mouth daily as needed.      0   . predniSONE (DELTASONE) 20 MG tablet   Oral   Take 2 tablets (40 mg total) by mouth daily.  10 tablet   0   . rosuvastatin (CRESTOR) 5 MG tablet   Oral   Take 5 mg by mouth daily.           Allergies Morphine and related and Vicodin  Family History  Problem Relation Age of Onset  . Hypertension Brother     Social History Social History  Substance Use Topics  . Smoking status: Former Research scientist (life sciences)  . Smokeless tobacco: None  . Alcohol Use: No    Review of Systems Constitutional: Negative for fever. Cardiovascular: Negative for chest pain. Respiratory: Negative for shortness of breath. Gastrointestinal: Negative for abdominal pain Musculoskeletal: Negative for back pain. Negative for neck pain Neurological: Positive for headache. States severe headache. Denies focal weakness or numbness. 10-point ROS otherwise negative.  ____________________________________________   PHYSICAL EXAM:  VITAL SIGNS: ED Triage Vitals  Enc Vitals Group     BP 10/12/15 1427 188/108 mmHg     Pulse Rate 10/12/15 1427 93     Resp 10/12/15 1427 20     Temp 10/12/15 1427 97.7 F (36.5 C)     Temp  Source 10/12/15 1427 Oral     SpO2 10/12/15 1427 97 %     Weight 10/12/15 1419 185 lb (83.915 kg)     Height 10/12/15 1419 5\' 3"  (1.6 m)     Head Cir --      Peak Flow --      Pain Score 10/12/15 1419 10     Pain Loc --      Pain Edu? --      Excl. in [place]? --     Constitutional: Alert and oriented. Moderate distress due to headache. Eyes: Normal exam ENT   Head: Normocephalic and atraumatic   Mouth/Throat: Mucous membranes are moist. Cardiovascular: Normal rate, regular rhythm. No murmur Respiratory: Normal respiratory effort without tachypnea nor retractions. Breath sounds are clear Gastrointestinal: Soft and nontender. No distention.  Musculoskeletal: Nontender with normal range of motion in all extremities. Neurologic:  Normal speech and language. No gross focal neurologic deficits. Equal grip strengths bilaterally. Moves all extremities equally. Pupils equal and reactive. Skin:  Skin is warm, dry and intact.  Psychiatric: Mood and affect are normal. Speech and behavior are normal.   ____________________________________________    INITIAL IMPRESSION / ASSESSMENT AND PLAN / ED COURSE  Pertinent labs & imaging results that were available during my care of the patient were reviewed by me and considered in my medical decision making (see chart for details).  Patient presents the emergency department with a headache. Patient states this started as a slight headache, she took 2 Tylenol, but as her dialysis session continued the headache worsen so she came to the emergency department for evaluation. Here the patient is in moderate distress, holding her head saying that she is having a migraine headache. She does state it feels like her typical migraine headache but states she's not had one this bad in 2 or 3 months. Denies any focal weakness or numbness. Has no intact neurologic exam currently. We will treat with IM Toradol and Phenergan, and monitor in the emergency department. At  this time as the patient states a long history of migraines to which this feels identical, I do not believe imaging is of much utility. We'll attempt treating the patient's headache, and monitor for improvement. Of note the patient was seen in the emergency department overnight for shortness of breath, which was thought to be due to her need of dialysis, she received  nearly her complete session of dialysis today. Denies any shortness of breath currently. Patient does states she has not filled any of her medications prescribed to her last night yet.  Patient sleeping soundly, awakens easily to voice, states her nausea is gone continues to have mild headache. I discussed with the patient discharging her home so she can get some rest, patient is agreeable to this plan. We'll discharge patient home with primary care follow-up as soon as possible. I discussed my normal headache return precautions with the patient.  ____________________________________________   FINAL CLINICAL IMPRESSION(S) / ED DIAGNOSES  Migraine headache   Harvest Dark, MD 10/12/15 [phone]

## 2015-10-12 NOTE — ED Notes (Signed)
Has not gotten her rx's for antibiotic, cough med or steroid filled

## 2015-10-12 NOTE — ED Notes (Signed)
Pt was discharged from [facility] this am for sob and htn. Went to dialysis and states headache. Goes to pain mgmt for neuropathy and chronic pain.

## 2015-10-12 NOTE — ED Provider Notes (Signed)
[facility] Emergency Department Provider Note  ____________________________________________  Time seen: Approximately 6:32 AM  I have reviewed the triage vital signs and the nursing notes.   HISTORY  Chief Complaint Shortness of Breath    HPI Jasmine Holloway is a 55 y.o. female with a history of chronic kidney failure on hemodialysis Tuesday, Thursday, Saturday who presents by EMS for chest pain and shortness of breath upon arrival at the dialysis center.  She has had multiple visits in the past for the same sort of complaint.  She is feeling uncomfortable and short of breath she becomes very anxious which seems to exacerbate the symptoms.  She describes her chest pain as both sharp and heavy and in the center of her chest similar to prior.  She is also reporting moderate shortness of breath.  The symptoms have been gradual in onset over the last day.  She states that she feels bad all over and is having swelling in her legs.  I saw the patient the last time she was here which was about 2 weeks ago and she had a similar presentation at that time.  She denies fever/chills, nausea, vomiting.   Past Medical History  Diagnosis Date  . ESRD (end stage renal disease) on dialysis ([place])   . Hypertension   . Polycystic kidney disease   . Asthma   . COPD (chronic obstructive pulmonary disease) [facility])     Patient Active Problem List   Diagnosis Date Noted  . Chest pain 02/24/2015    Past Surgical History  Procedure Laterality Date  . Arteriovenous graft placement Right     x3 (R forearm currently used for access)  . Vein harvest      Current Outpatient Rx  Name  Route  Sig  Dispense  Refill  . azithromycin (ZITHROMAX) 250 MG tablet      Take 1 tablet PO daily for 4 more days starting tomorrow (10/06/15)   4 each   0   . b complex-vitamin c-folic acid (NEPHRO-VITE) 0.8 MG TABS tablet   Oral   Take 1 tablet by mouth at bedtime.         .  brompheniramine-pseudoephedrine-DM 30-2-10 MG/5ML syrup   Oral   Take 5 mLs by mouth 4 (four) times daily as needed.   120 mL   0   . calcium acetate (PHOSLO) 667 MG capsule   Oral   Take by mouth 3 (three) times daily with meals.         . cinacalcet (SENSIPAR) 30 MG tablet   Oral   Take 30 mg by mouth daily.         Marland Kitchen gabapentin (NEURONTIN) 300 MG capsule   Oral   Take 300 mg by mouth 3 (three) times daily.         . GuaiFENesin (MUCINEX PO)   Oral   Take by mouth.         . metoprolol succinate (TOPROL-XL) 50 MG 24 hr tablet   Oral   Take 50 mg by mouth daily. Take with or immediately following a meal.         . omeprazole (PRILOSEC) 20 MG capsule   Oral   Take 20 mg by mouth daily.         Marland Kitchen oxyCODONE-acetaminophen (PERCOCET) 10-325 MG tablet   Oral   Take 1 tablet by mouth daily as needed.      0   . predniSONE (DELTASONE) 20 MG tablet  Oral   Take 2 tablets (40 mg total) by mouth daily.   10 tablet   0   . rosuvastatin (CRESTOR) 5 MG tablet   Oral   Take 5 mg by mouth daily.           Allergies Morphine and related and Vicodin  Family History  Problem Relation Age of Onset  . Hypertension Brother     Social History Social History  Substance Use Topics  . Smoking status: Former Research scientist (life sciences)  . Smokeless tobacco: None  . Alcohol Use: No    Review of Systems Constitutional: No fever/chills Eyes: No visual changes. ENT: No sore throat. Cardiovascular: Central chest pain Respiratory: Moderate shortness of breath. Gastrointestinal: No abdominal pain.  No nausea, no vomiting.  No diarrhea.  No constipation. Genitourinary: Negative for dysuria. Musculoskeletal: Negative for back pain.  Lower extremity edema bilaterally. Skin: Negative for rash. Neurological: Negative for headaches, focal weakness or numbness.  10-point ROS otherwise negative.  ____________________________________________   PHYSICAL EXAM:  VITAL SIGNS: ED  Triage Vitals  Enc Vitals Group     BP 10/12/15 0620 195/110 mmHg     Pulse Rate 10/12/15 0620 84     Resp 10/12/15 0620 18     Temp 10/12/15 0620 98.1 F (36.7 C)     Temp Source 10/12/15 0620 Oral     SpO2 10/12/15 0612 98 %     Weight 10/12/15 0620 185 lb (83.915 kg)     Height 10/12/15 0620 5\' 3"  (1.6 m)     Head Cir --      Peak Flow --      Pain Score 10/12/15 0623 8     Pain Loc --      Pain Edu? --      Excl. in [place]? --     Constitutional: Alert and oriented.  Anxious but Well appearing and in mild distress. Eyes: Conjunctivae are normal. PERRL. EOMI. Head: Atraumatic. Nose: No congestion/rhinnorhea. Mouth/Throat: Mucous membranes are moist.  Oropharynx non-erythematous. Neck: No stridor.   Cardiovascular: Normal rate, regular rhythm. Grossly normal heart sounds.  Good peripheral circulation. Respiratory: Slightly increased respiratory rate but no accessory muscle usage.  No retractions. Lungs have mild crackles in lower fields most consistent with slight volume overload.  Minimal expiratory wheezes. Gastrointestinal: Obese.  Soft and nontender. No distention. No abdominal bruits. No CVA tenderness. Musculoskeletal: 1+ bilateral pitting edema in lower extremities Neurologic:  Normal speech and language. No gross focal neurologic deficits are appreciated.  Skin:  Skin is warm, dry and intact. No rash noted. Psychiatric: Mood and affect are anxious. ____________________________________________   LABS (all labs ordered are listed, but only abnormal results are displayed)  Labs Reviewed  TROPONIN I - Abnormal; Notable for the following:    Troponin I 0.06 (*)    All other components within normal limits  CBC WITH DIFFERENTIAL/PLATELET - Abnormal; Notable for the following:    WBC 11.9 (*)    RBC 3.42 (*)    Hemoglobin 10.3 (*)    HCT 31.2 (*)    RDW 18.3 (*)    Neutro Abs 7.6 (*)    Eosinophils Absolute 0.8 (*)    All other components within normal limits   COMPREHENSIVE METABOLIC PANEL - Abnormal; Notable for the following:    Potassium 5.3 (*)    BUN 69 (*)    Creatinine, Ser 7.86 (*)    Calcium 7.2 (*)    GFR calc non Af Amer 5 (*)  GFR calc Af Amer 6 (*)    All other components within normal limits  BRAIN NATRIURETIC PEPTIDE - Abnormal; Notable for the following:    B Natriuretic Peptide 714.0 (*)    All other components within normal limits  MAGNESIUM   ____________________________________________  EKG  ED ECG REPORT I, Yarithza Mink, the attending physician, personally viewed and interpreted this ECG.  Date: 10/12/2015 EKG Time: 06:15 Rate: 87 Rhythm: normal sinus rhythm QRS Axis: normal Intervals: normal ST/T Wave abnormalities: Non-specific ST segment / T-wave changes, but no evidence of acute ischemia. Conduction Disturbances: none Narrative Interpretation: unremarkable  ____________________________________________  RADIOLOGY   Dg Chest Portable 1 View  10/12/2015  CLINICAL DATA:  Acute onset of shortness of breath and nausea. Subacute onset of bilateral lower extremity edema. Initial encounter. EXAM: PORTABLE CHEST 1 VIEW COMPARISON:  Chest radiograph performed 10/05/2015 FINDINGS: The lungs are well-aerated. Vascular congestion is noted. Mildly increased interstitial markings raise concern for mild interstitial edema. There is no evidence of pleural effusion or pneumothorax. The cardiomediastinal silhouette is mildly enlarged. No acute osseous abnormalities are seen. Scattered clips are seen overlying the left axilla. IMPRESSION: Vascular congestion and mild cardiomegaly. Mildly increased interstitial markings raise concern for mild interstitial edema. Electronically Signed   By: Garald Balding M.D.   On: 10/12/2015 06:33    ____________________________________________   PROCEDURES  Procedure(s) performed: None  Critical Care performed: No ____________________________________________   INITIAL IMPRESSION /  ASSESSMENT AND PLAN / ED COURSE  Pertinent labs & imaging results that were available during my care of the patient were reviewed by me and considered in my medical decision making (see chart for details).  The patient appears to be presenting with mild respiratory distress in the setting of slight volume overload and needing dialysis.  This is similar to her presentation in the past.  Anticipate that she will have a slightly positive troponin consistent with her rest her distress and end-stage renal disease; has been positive in the past.  I also anticipate her BNP will be slightly elevated.  I will proceed with a standard workup but it is that her treatment will be discharged back to her dialysis center.  She typically benefits from a small dose of benzodiazepine, so I will give her a dose of Valium 2 mg IV to help with her anxiety and work of breathing.  ----------------------------------------- 7:40 AM on 10/12/2015 -----------------------------------------  I noticed that she has not yet received the Valium, so I went to check on the patient as if she was doing and found that she is fast asleep and breathing comfortably.  I canceled the Valium at this time.  Her labs are unremarkable and her workup indicates that she needs to go to dialysis to have her regular treatment there is no indication for further intervention or hospitalization at this time.  I woke her up and she did state that she feels better and is currently having no chest pain and no shortness of breath.  When she was asleep her oxygen saturation dropped to 89% but she is morbidly obese and has COPD and CHF.  When she is awake she is satting in the upper to mid 90s.  Again, her treatment will be dialysis.  She has already been treated for COPD prior to arrival with Solu-Medrol and a DuoNeb.  She is in no distress now and appropriate for outpatient dialysis.  ____________________________________________  FINAL CLINICAL IMPRESSION(S)  / ED DIAGNOSES  Final diagnoses:  Interstitial edema  Chronic  obstructive pulmonary disease, unspecified COPD type ([place])  ESRD on hemodialysis ([place])      NEW MEDICATIONS STARTED DURING THIS VISIT:  New Prescriptions   No medications on file      Note:  This document was prepared using Dragon voice recognition software and may include unintentional dictation errors.   Hinda Kehr, MD 10/12/15 [phone]

## 2015-10-12 NOTE — ED Notes (Signed)
Pt sleeping soundly, chest rise and noted, NAD noted.

## 2015-12-03 HISTORY — PX: BREAST BIOPSY: SHX20

## 2015-12-18 HISTORY — PX: COLONOSCOPY: SHX174

## 2015-12-21 ENCOUNTER — Observation Stay: Payer: Medicare Other

## 2015-12-21 ENCOUNTER — Encounter: Payer: Self-pay | Admitting: Emergency Medicine

## 2015-12-21 ENCOUNTER — Observation Stay
Admit: 2015-12-21 | Discharge: 2015-12-21 | Disposition: A | Discharge status: Home / Self Care | Payer: Medicare Other | Attending: Internal Medicine | Admitting: Internal Medicine

## 2015-12-21 ENCOUNTER — Emergency Department: Payer: Medicare Other

## 2015-12-21 ENCOUNTER — Observation Stay
Admission: EM | Admit: 2015-12-21 | Discharge: 2015-12-22 | Disposition: A | Discharge status: Home / Self Care | Payer: Medicare Other | Attending: Internal Medicine | Admitting: Internal Medicine

## 2015-12-21 DIAGNOSIS — M1990 Unspecified osteoarthritis, unspecified site: Secondary | ICD-10-CM | POA: Insufficient documentation

## 2015-12-21 DIAGNOSIS — F439 Reaction to severe stress, unspecified: Secondary | ICD-10-CM | POA: Insufficient documentation

## 2015-12-21 DIAGNOSIS — R0602 Shortness of breath: Secondary | ICD-10-CM | POA: Diagnosis not present

## 2015-12-21 DIAGNOSIS — E1122 Type 2 diabetes mellitus with diabetic chronic kidney disease: Secondary | ICD-10-CM | POA: Insufficient documentation

## 2015-12-21 DIAGNOSIS — I132 Hypertensive heart and chronic kidney disease with heart failure and with stage 5 chronic kidney disease, or end stage renal disease: Secondary | ICD-10-CM | POA: Diagnosis not present

## 2015-12-21 DIAGNOSIS — I083 Combined rheumatic disorders of mitral, aortic and tricuspid valves: Secondary | ICD-10-CM | POA: Insufficient documentation

## 2015-12-21 DIAGNOSIS — Q613 Polycystic kidney, unspecified: Secondary | ICD-10-CM | POA: Diagnosis not present

## 2015-12-21 DIAGNOSIS — E1140 Type 2 diabetes mellitus with diabetic neuropathy, unspecified: Secondary | ICD-10-CM | POA: Insufficient documentation

## 2015-12-21 DIAGNOSIS — D638 Anemia in other chronic diseases classified elsewhere: Secondary | ICD-10-CM | POA: Insufficient documentation

## 2015-12-21 DIAGNOSIS — Z79899 Other long term (current) drug therapy: Secondary | ICD-10-CM | POA: Insufficient documentation

## 2015-12-21 DIAGNOSIS — I517 Cardiomegaly: Secondary | ICD-10-CM | POA: Insufficient documentation

## 2015-12-21 DIAGNOSIS — I10 Essential (primary) hypertension: Secondary | ICD-10-CM

## 2015-12-21 DIAGNOSIS — I5033 Acute on chronic diastolic (congestive) heart failure: Secondary | ICD-10-CM | POA: Diagnosis not present

## 2015-12-21 DIAGNOSIS — E8770 Fluid overload, unspecified: Secondary | ICD-10-CM | POA: Insufficient documentation

## 2015-12-21 DIAGNOSIS — N186 End stage renal disease: Secondary | ICD-10-CM | POA: Diagnosis not present

## 2015-12-21 DIAGNOSIS — Z7951 Long term (current) use of inhaled steroids: Secondary | ICD-10-CM | POA: Insufficient documentation

## 2015-12-21 DIAGNOSIS — R9431 Abnormal electrocardiogram [ECG] [EKG]: Secondary | ICD-10-CM | POA: Diagnosis present

## 2015-12-21 DIAGNOSIS — I248 Other forms of acute ischemic heart disease: Secondary | ICD-10-CM | POA: Diagnosis not present

## 2015-12-21 DIAGNOSIS — J441 Chronic obstructive pulmonary disease with (acute) exacerbation: Secondary | ICD-10-CM | POA: Insufficient documentation

## 2015-12-21 DIAGNOSIS — R7989 Other specified abnormal findings of blood chemistry: Secondary | ICD-10-CM | POA: Diagnosis present

## 2015-12-21 DIAGNOSIS — R0789 Other chest pain: Secondary | ICD-10-CM | POA: Diagnosis not present

## 2015-12-21 DIAGNOSIS — R079 Chest pain, unspecified: Secondary | ICD-10-CM | POA: Diagnosis present

## 2015-12-21 DIAGNOSIS — R918 Other nonspecific abnormal finding of lung field: Secondary | ICD-10-CM | POA: Diagnosis not present

## 2015-12-21 DIAGNOSIS — G894 Chronic pain syndrome: Secondary | ICD-10-CM | POA: Insufficient documentation

## 2015-12-21 DIAGNOSIS — E782 Mixed hyperlipidemia: Secondary | ICD-10-CM

## 2015-12-21 DIAGNOSIS — Z87891 Personal history of nicotine dependence: Secondary | ICD-10-CM | POA: Insufficient documentation

## 2015-12-21 DIAGNOSIS — Z992 Dependence on renal dialysis: Secondary | ICD-10-CM | POA: Diagnosis not present

## 2015-12-21 DIAGNOSIS — R778 Other specified abnormalities of plasma proteins: Secondary | ICD-10-CM | POA: Diagnosis not present

## 2015-12-21 DIAGNOSIS — Z885 Allergy status to narcotic agent status: Secondary | ICD-10-CM | POA: Insufficient documentation

## 2015-12-21 DIAGNOSIS — M7989 Other specified soft tissue disorders: Secondary | ICD-10-CM | POA: Insufficient documentation

## 2015-12-21 DIAGNOSIS — Z888 Allergy status to other drugs, medicaments and biological substances status: Secondary | ICD-10-CM | POA: Insufficient documentation

## 2015-12-21 DIAGNOSIS — R569 Unspecified convulsions: Secondary | ICD-10-CM | POA: Diagnosis not present

## 2015-12-21 DIAGNOSIS — M79642 Pain in left hand: Secondary | ICD-10-CM | POA: Insufficient documentation

## 2015-12-21 DIAGNOSIS — Z7982 Long term (current) use of aspirin: Secondary | ICD-10-CM | POA: Insufficient documentation

## 2015-12-21 DIAGNOSIS — E785 Hyperlipidemia, unspecified: Secondary | ICD-10-CM | POA: Diagnosis not present

## 2015-12-21 LAB — LIPID PANEL
Cholesterol: 168 mg/dL (ref 0–200)
HDL: 60 mg/dL (ref >40)
LDL CALC: 99 mg/dL (ref 0–99)
TRIGLYCERIDES: 47 mg/dL (ref <150)
Total CHOL/HDL Ratio: 2.8 RATIO
VLDL: 9 mg/dL (ref 0–40)

## 2015-12-21 LAB — BASIC METABOLIC PANEL
ANION GAP: 7 (ref 5–15)
BUN: 46 mg/dL — AB (ref 6–20)
CO2: 31 mmol/L (ref 22–32)
Calcium: 8.7 mg/dL — ABNORMAL LOW (ref 8.9–10.3)
Chloride: 105 mmol/L (ref 101–111)
Creatinine, Ser: 9.15 mg/dL — ABNORMAL HIGH (ref 0.44–1.00)
GFR, EST AFRICAN AMERICAN: 5 mL/min — AB (ref >60)
GFR, EST NON AFRICAN AMERICAN: 4 mL/min — AB (ref >60)
Glucose, Bld: 96 mg/dL (ref 65–99)
POTASSIUM: 4.1 mmol/L (ref 3.5–5.1)
SODIUM: 143 mmol/L (ref 135–145)

## 2015-12-21 LAB — CBC
HEMATOCRIT: 26.2 % — AB (ref 35.0–47.0)
Hemoglobin: 8.5 g/dL — ABNORMAL LOW (ref 12.0–16.0)
MCH: 29.3 pg (ref 26.0–34.0)
MCHC: 32.4 g/dL (ref 32.0–36.0)
MCV: 90.3 fL (ref 80.0–100.0)
PLATELETS: 250 10*3/uL (ref 150–440)
RBC: 2.9 MIL/uL — ABNORMAL LOW (ref 3.80–5.20)
RDW: 18.8 % — AB (ref 11.5–14.5)
WBC: 8.5 10*3/uL (ref 3.6–11.0)

## 2015-12-21 LAB — TROPONIN I
TROPONIN I: 0.07 ng/mL — AB (ref <0.031)
TROPONIN I: 0.07 ng/mL — AB (ref <0.031)
Troponin I: 0.06 ng/mL — ABNORMAL HIGH (ref <0.031)
Troponin I: 0.04 ng/mL — ABNORMAL HIGH (ref <0.031)

## 2015-12-21 LAB — PHOSPHORUS: Phosphorus: 3.4 mg/dL (ref 2.5–4.6)

## 2015-12-21 LAB — ECHOCARDIOGRAM COMPLETE
Height: 63 in
Weight: 2880 oz

## 2015-12-21 LAB — MRSA PCR SCREENING: MRSA BY PCR: NEGATIVE

## 2015-12-21 MED ORDER — ROSUVASTATIN CALCIUM 10 MG PO TABS
5.0000 mg | ORAL_TABLET | Freq: Every day | ORAL | Status: DC
  Administered 2015-12-21 – 2015-12-22 (×2): 5 mg via ORAL
  Filled 2015-12-21 (×2): qty 1

## 2015-12-21 MED ORDER — METHYLPREDNISOLONE SODIUM SUCC 125 MG IJ SOLR
125.0000 mg | Freq: Once | INTRAMUSCULAR | Status: AC
  Administered 2015-12-21: 125 mg via INTRAVENOUS
  Filled 2015-12-21: qty 2

## 2015-12-21 MED ORDER — ACETAMINOPHEN 325 MG PO TABS: 650.0000 mg | ORAL_TABLET | Freq: Three times a day (TID) | ORAL | Status: DC | PRN

## 2015-12-21 MED ORDER — PANTOPRAZOLE SODIUM 40 MG PO TBEC
40.0000 mg | DELAYED_RELEASE_TABLET | Freq: Every day | ORAL | Status: DC
  Administered 2015-12-21 – 2015-12-22 (×2): 40 mg via ORAL
  Filled 2015-12-21 (×2): qty 1

## 2015-12-21 MED ORDER — GUAIFENESIN ER 600 MG PO TB12: 600.0000 mg | ORAL_TABLET | Freq: Two times a day (BID) | ORAL | Status: DC | PRN

## 2015-12-21 MED ORDER — LABETALOL HCL 5 MG/ML IV SOLN
10.0000 mg | Freq: Once | INTRAVENOUS | Status: AC
  Administered 2015-12-21: 10 mg via INTRAVENOUS
  Filled 2015-12-21: qty 4

## 2015-12-21 MED ORDER — RENA-VITE PO TABS
1.0000 | ORAL_TABLET | Freq: Every day | ORAL | Status: DC
  Administered 2015-12-21: 1 via ORAL
  Filled 2015-12-21: qty 1

## 2015-12-21 MED ORDER — NITROGLYCERIN 0.4 MG SL SUBL: 0.4000 mg | SUBLINGUAL_TABLET | SUBLINGUAL | Status: DC | PRN

## 2015-12-21 MED ORDER — HEPARIN SODIUM (PORCINE) 5000 UNIT/ML IJ SOLN
5000.0000 [IU] | Freq: Three times a day (TID) | INTRAMUSCULAR | Status: DC
  Administered 2015-12-21 – 2015-12-22 (×3): 5000 [IU] via SUBCUTANEOUS
  Filled 2015-12-21 (×3): qty 1

## 2015-12-21 MED ORDER — ACETAMINOPHEN 325 MG PO TABS
650.0000 mg | ORAL_TABLET | ORAL | Status: DC | PRN
Start: 2015-12-21 — End: 2015-12-22
  Administered 2015-12-21: 650 mg via ORAL

## 2015-12-21 MED ORDER — ASPIRIN 81 MG PO CHEW
324.0000 mg | CHEWABLE_TABLET | Freq: Once | ORAL | Status: AC
  Administered 2015-12-21: 324 mg via ORAL
  Filled 2015-12-21: qty 4

## 2015-12-21 MED ORDER — AMLODIPINE BESYLATE 5 MG PO TABS
5.0000 mg | ORAL_TABLET | Freq: Every day | ORAL | Status: DC
  Administered 2015-12-21 – 2015-12-22 (×2): 5 mg via ORAL
  Filled 2015-12-21 (×2): qty 1

## 2015-12-21 MED ORDER — LIDOCAINE-PRILOCAINE 2.5-2.5 % EX CREA
1.0000 "application " | TOPICAL_CREAM | CUTANEOUS | Status: DC | PRN
  Filled 2015-12-21: qty 5

## 2015-12-21 MED ORDER — METHYLPREDNISOLONE SODIUM SUCC 125 MG IJ SOLR
60.0000 mg | INTRAMUSCULAR | Status: DC
  Administered 2015-12-21 – 2015-12-22 (×2): 60 mg via INTRAVENOUS
  Filled 2015-12-21 (×2): qty 2

## 2015-12-21 MED ORDER — OXYCODONE HCL 5 MG PO TABS
10.0000 mg | ORAL_TABLET | Freq: Three times a day (TID) | ORAL | Status: DC | PRN
  Administered 2015-12-21: 10 mg via ORAL
  Filled 2015-12-21: qty 2

## 2015-12-21 MED ORDER — SODIUM CHLORIDE 0.9 % IV SOLN: 100.0000 mL | INTRAVENOUS | Status: DC | PRN

## 2015-12-21 MED ORDER — OXYCODONE-ACETAMINOPHEN 10-325 MG PO TABS: 1.0000 | ORAL_TABLET | Freq: Three times a day (TID) | ORAL | Status: DC | PRN

## 2015-12-21 MED ORDER — FUROSEMIDE 40 MG PO TABS
80.0000 mg | ORAL_TABLET | Freq: Every day | ORAL | Status: DC
  Administered 2015-12-21 – 2015-12-22 (×2): 80 mg via ORAL
  Filled 2015-12-21 (×2): qty 2

## 2015-12-21 MED ORDER — SODIUM CHLORIDE 0.9% FLUSH
3.0000 mL | INTRAVENOUS | Status: DC | PRN
Start: 2015-12-21 — End: 2015-12-22

## 2015-12-21 MED ORDER — GABAPENTIN 300 MG PO CAPS: 300.0000 mg | ORAL_CAPSULE | ORAL | Status: DC

## 2015-12-21 MED ORDER — ONDANSETRON HCL 4 MG/2ML IJ SOLN
4.0000 mg | Freq: Four times a day (QID) | INTRAMUSCULAR | Status: DC | PRN
  Administered 2015-12-21: 4 mg via INTRAVENOUS
  Filled 2015-12-21: qty 2

## 2015-12-21 MED ORDER — ALBUTEROL SULFATE (2.5 MG/3ML) 0.083% IN NEBU
3.0000 mL | INHALATION_SOLUTION | Freq: Four times a day (QID) | RESPIRATORY_TRACT | Status: DC | PRN
  Administered 2015-12-21: 3 mL via RESPIRATORY_TRACT
  Filled 2015-12-21: qty 3

## 2015-12-21 MED ORDER — HEPARIN SODIUM (PORCINE) 1000 UNIT/ML DIALYSIS
50.0000 [IU]/kg | INTRAMUSCULAR | Status: DC | PRN
  Filled 2015-12-21: qty 5

## 2015-12-21 MED ORDER — ASPIRIN 81 MG PO CHEW
324.0000 mg | CHEWABLE_TABLET | ORAL | Status: AC
Start: 2015-12-21 — End: 2015-12-21

## 2015-12-21 MED ORDER — IPRATROPIUM-ALBUTEROL 0.5-2.5 (3) MG/3ML IN SOLN
3.0000 mL | Freq: Once | RESPIRATORY_TRACT | Status: AC
  Administered 2015-12-21: 3 mL via RESPIRATORY_TRACT
  Filled 2015-12-21: qty 3

## 2015-12-21 MED ORDER — ASPIRIN 300 MG RE SUPP: 300.0000 mg | RECTAL | Status: AC

## 2015-12-21 MED ORDER — GABAPENTIN 300 MG PO CAPS
300.0000 mg | ORAL_CAPSULE | Freq: Every day | ORAL | Status: DC
  Administered 2015-12-21 – 2015-12-22 (×2): 300 mg via ORAL
  Filled 2015-12-21 (×2): qty 1

## 2015-12-21 MED ORDER — CINACALCET HCL 30 MG PO TABS
30.0000 mg | ORAL_TABLET | Freq: Every day | ORAL | Status: DC
  Administered 2015-12-21 – 2015-12-22 (×2): 30 mg via ORAL
  Filled 2015-12-21 (×2): qty 1

## 2015-12-21 MED ORDER — METOPROLOL SUCCINATE ER 50 MG PO TB24
50.0000 mg | ORAL_TABLET | Freq: Every day | ORAL | Status: DC
  Administered 2015-12-21 – 2015-12-22 (×2): 50 mg via ORAL
  Filled 2015-12-21 (×2): qty 1

## 2015-12-21 MED ORDER — CALCIUM ACETATE (PHOS BINDER) 667 MG PO CAPS
667.0000 mg | ORAL_CAPSULE | Freq: Three times a day (TID) | ORAL | Status: DC
  Administered 2015-12-21 – 2015-12-22 (×4): 667 mg via ORAL
  Filled 2015-12-21 (×4): qty 1

## 2015-12-21 MED ORDER — SODIUM CHLORIDE 0.9% FLUSH
3.0000 mL | Freq: Two times a day (BID) | INTRAVENOUS | Status: DC
  Administered 2015-12-21 – 2015-12-22 (×3): 3 mL via INTRAVENOUS

## 2015-12-21 MED ORDER — ASPIRIN EC 81 MG PO TBEC
81.0000 mg | DELAYED_RELEASE_TABLET | Freq: Every day | ORAL | Status: DC
  Administered 2015-12-22: 81 mg via ORAL
  Filled 2015-12-21: qty 1

## 2015-12-21 MED ORDER — SODIUM CHLORIDE 0.9 % IV SOLN: 250.0000 mL | INTRAVENOUS | Status: DC | PRN

## 2015-12-21 MED ORDER — ALBUTEROL SULFATE (2.5 MG/3ML) 0.083% IN NEBU
5.0000 mg | INHALATION_SOLUTION | Freq: Once | RESPIRATORY_TRACT | Status: AC
  Administered 2015-12-21: 5 mg via RESPIRATORY_TRACT
  Filled 2015-12-21: qty 6

## 2015-12-21 MED ORDER — PREDNISONE 10 MG (21) PO TBPK: 10.0000 mg | ORAL_TABLET | Freq: Every day | ORAL | Status: DC

## 2015-12-21 MED ORDER — PEG 3350-KCL-NA BICARB-NACL 420 G PO SOLR: 4.0000 L | Freq: Once | ORAL | Status: DC

## 2015-12-21 NOTE — Care Management Obs Status (Signed)
[place] NOTIFICATION   Patient Details  Name: Nihla Hoos MRN: [id number] Date of Birth: 01-10-1961   Medicare Observation Status Notification Given:  Yes    Ival Bible, RN 12/21/2015, 6:38 PM
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
## 2015-12-21 NOTE — Progress Notes (Signed)
PRE DIALYSIS ASSESSMENT 

## 2015-12-21 NOTE — Progress Notes (Signed)
Patient and hemodialysis when attempted to see. We'll see when patient is available.

## 2015-12-21 NOTE — Progress Notes (Signed)
Patient is admitted to room 254 with a diagnosis of  Chest pain. Alert and oriented x 4. Denied any acute chest pain right now. No respiratory distress noted. Tele box verified by the RN and  Tiffany M.RN. Skin assessment done by Yasmin S. And Tiffany M. RN, 3 + pitting edema on bilateral legs. Patient oriented to her room, staff, call bell/ascom. Will continue to monitor.
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
## 2015-12-21 NOTE — Progress Notes (Signed)
This note also relates to the following rows which could not be included: Pulse Rate - Cannot attach notes to unvalidated device data Resp - Cannot attach notes to unvalidated device data BP - Cannot attach notes to unvalidated device data   POST DIALYSIS ASSESSMENT

## 2015-12-21 NOTE — Progress Notes (Signed)
START DIALYSIS TREATMENT

## 2015-12-21 NOTE — Progress Notes (Signed)
POST DIALYSIS ASSESSMENT 
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
## 2015-12-21 NOTE — Progress Notes (Signed)
This note also relates to the following rows which could not be included: Pulse Rate - Cannot attach notes to unvalidated device data Resp - Cannot attach notes to unvalidated device data BP - Cannot attach notes to unvalidated device data  HD COMPLETED  

## 2015-12-21 NOTE — ED Notes (Signed)
+   Bruit and thrill in AV fistula, RFA

## 2015-12-22 DIAGNOSIS — I10 Essential (primary) hypertension: Secondary | ICD-10-CM

## 2015-12-22 DIAGNOSIS — N186 End stage renal disease: Secondary | ICD-10-CM

## 2015-12-22 DIAGNOSIS — R7989 Other specified abnormal findings of blood chemistry: Secondary | ICD-10-CM

## 2015-12-22 DIAGNOSIS — R0789 Other chest pain: Secondary | ICD-10-CM | POA: Diagnosis not present

## 2015-12-22 DIAGNOSIS — I5033 Acute on chronic diastolic (congestive) heart failure: Secondary | ICD-10-CM

## 2015-12-22 DIAGNOSIS — E782 Mixed hyperlipidemia: Secondary | ICD-10-CM

## 2015-12-22 DIAGNOSIS — Z992 Dependence on renal dialysis: Secondary | ICD-10-CM

## 2015-12-22 DIAGNOSIS — R778 Other specified abnormalities of plasma proteins: Secondary | ICD-10-CM

## 2015-12-22 LAB — HEPATITIS B SURFACE ANTIGEN: HEP B S AG: NEGATIVE

## 2015-12-22 NOTE — Progress Notes (Signed)
Patient getting dressed for discharge - refused assistance. Educated on safety. Patient given discharge teaching and paperwork regarding medications, diet, follow-up appointments and activity. Patient understanding verbalized. No complaints at this time. IV and telemetry discontinued prior to leaving. Skin assessment as previously charted and vitals are stable; on room air. Patient being discharged to home. No further needs by Care Management. No new prescriptions. Transporting home via [employer] with voucher from [place].

## 2015-12-22 NOTE — Care Management Note (Signed)
Case Management Note  Patient Details  Name: Jasmine Holloway MRN: [id number] Date of Birth: 12-Apr-1961  Subjective/Objective:    Discharge home today with no home services orders. Jasmine Holloway, Dialysis liaison was notified by this writer of resumption of dialysis.                Action/Plan:   Expected Discharge Date:                  Expected Discharge Plan:     In-House Referral:     Discharge planning Services     Post Acute Care Choice:    Choice offered to:     DME Arranged:    DME Agency:     HH Arranged:    [place] Agency:     Status of Service:     Medicare Important Message Given:    Date Medicare IM Given:    Medicare IM give by:    Date Additional Medicare IM Given:    Additional Medicare Important Message give by:     If discussed at [place] of Stay Meetings, dates discussed:    Additional Comments:  Jasmine Holloway A, RN 12/22/2015, 11:46 AM
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
## 2015-12-22 NOTE — Progress Notes (Signed)
Subjective:   Jasmine Holloway Admitted for chest discomfort and SOB Improved after dialysis. 2800 cc removed Feels better this AM No SOB. Room air this morning  Objective:  Vital signs in last 24 hours:  Temp:  [98 F (36.7 C)-98.2 F (36.8 C)] 98 F (36.7 C) (05/14 0459) Pulse Rate:  [71-95] 71 (05/14 0850) Resp:  [12-19] 18 (05/14 0459) BP: (128-168)/(81-108) 158/93 mmHg (05/14 0850) SpO2:  [91 %-100 %] 93 % (05/14 1014)  Weight change: 7.153 kg (15 lb 12.3 oz) Filed Weights   12/21/15 0049 12/21/15 1115  Weight: 81.647 kg (180 lb) 88.8 kg (195 lb 12.3 oz)    Intake/Output:    Intake/Output Summary (Last 24 hours) at 12/22/15 1123 Last data filed at 12/22/15 1004  Gross per 24 hour  Intake    240 ml  Output   3390 ml  Net  -3150 ml     Physical Exam: General: NAD  HEENT Anicteric, [place] O2, moist mucus membranes  Neck supple  Pulm/lungs Clear b/l  CVS/Heart No rub or gallop  Abdomen:  Soft, NT  Extremities: +trace edema  Neurologic: Alert, oriented  Skin: No acute rashes  Access: AVF       Basic Metabolic Panel:   Recent Labs Lab 12/21/15 0059 12/21/15 1117  NA 143  --   K 4.1  --   CL 105  --   CO2 31  --   GLUCOSE 96  --   BUN 46*  --   CREATININE 9.15*  --   CALCIUM 8.7*  --   PHOS  --  3.4     CBC:  Recent Labs Lab 12/21/15 0059  WBC 8.5  HGB 8.5*  HCT 26.2*  MCV 90.3  PLT 250      Microbiology:  Recent Results (from the past 720 hour(s))  MRSA PCR Screening     Status: None   Collection Time: 12/21/15  6:10 AM  Result Value Ref Range Status   MRSA by PCR NEGATIVE NEGATIVE Final    Comment:        The GeneXpert MRSA Assay (FDA approved for NASAL specimens only), is one component of a comprehensive MRSA colonization surveillance program. It is not intended to diagnose MRSA infection nor to guide or monitor treatment for MRSA infections.     Coagulation Studies: No results for  input(s): LABPROT, INR in the last 72 hours.  Urinalysis: No results for input(s): COLORURINE, LABSPEC, PHURINE, GLUCOSEU, HGBUR, BILIRUBINUR, KETONESUR, PROTEINUR, UROBILINOGEN, NITRITE, LEUKOCYTESUR in the last 72 hours.  Invalid input(s): APPERANCEUR    Imaging: Dg Chest 2 View  12/21/2015  CLINICAL DATA:  End-stage renal disease with hypertension and chest pain for 2 days EXAM: CHEST  2 VIEW COMPARISON:  12/21/2015 FINDINGS: Cardiac shadow remains enlarged. The lungs are well aerated bilaterally. The degree of vascular congestion has improved slightly in the interval from the prior exam. Minimal right basilar atelectasis is seen IMPRESSION: Right basilar atelectasis. Improvement in the degree of vascular congestion. Electronically Signed   By: Inez Catalina M.D.   On: 12/21/2015 18:32   Dg Chest Port 1 View  12/21/2015  CLINICAL DATA:  Dyspnea and chest pain, onset this morning. EXAM: PORTABLE CHEST 1 VIEW COMPARISON:  10/12/2015 FINDINGS: There is moderate unchanged cardiomegaly. There is mild vascular and interstitial prominence. No consolidation. No large effusions. IMPRESSION: Cardiomegaly. Mild chronic appearing vascular prominence. No consolidation or large effusion. Electronically Signed   By: Andreas Newport  M.D.   On: 12/21/2015 01:48     Medications:     . amLODipine  5 mg Oral Daily  . aspirin EC  81 mg Oral Daily  . calcium acetate  667 mg Oral TID WC  . cinacalcet  30 mg Oral Q breakfast  . furosemide  80 mg Oral Daily  . gabapentin  300 mg Oral Daily  . [START ON 12/23/2015] gabapentin  300 mg Oral Q M,W,F-2000  . heparin  5,000 Units Subcutaneous Q8H  . methylPREDNISolone (SOLU-MEDROL) injection  60 mg Intravenous Q24H  . metoprolol succinate  50 mg Oral Daily  . multivitamin  1 tablet Oral QHS  . pantoprazole  40 mg Oral QAC breakfast  . rosuvastatin  5 mg Oral Daily  . sodium chloride flush  3 mL Intravenous Q12H   sodium chloride, acetaminophen, oxyCODONE  **AND** acetaminophen, albuterol, guaiFENesin, nitroGLYCERIN, ondansetron (ZOFRAN) IV, sodium chloride flush  Assessment/ Plan:  55 y.o. female with ESRD, h/o peritonitis, arthritis, chronic left hand pain, ruptured abscess repair in remote past  1. ESRD. [address] dialysis. MWF-3 2. Acute pulm Edema 3. AOCKD 4. SHPTH  PLAN: Improved with urgent Dialysis  2800 cc removed    LOS:  Saniyya Gau 5/14/201711:23 AM

## 2015-12-22 NOTE — Progress Notes (Signed)
Spoke with Dr. Ether Griffins - order to discharge home.
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
## 2015-12-23 DIAGNOSIS — F17200 Nicotine dependence, unspecified, uncomplicated: Secondary | ICD-10-CM | POA: Insufficient documentation

## 2015-12-30 ENCOUNTER — Emergency Department
Admission: EM | Admit: 2015-12-30 | Discharge: 2015-12-30 | Disposition: A | Discharge status: Home / Self Care | Payer: Medicare Other | Attending: Emergency Medicine | Admitting: Emergency Medicine

## 2015-12-30 ENCOUNTER — Encounter: Payer: Self-pay | Admitting: Emergency Medicine

## 2015-12-30 ENCOUNTER — Emergency Department: Payer: Medicare Other

## 2015-12-30 DIAGNOSIS — Z87891 Personal history of nicotine dependence: Secondary | ICD-10-CM | POA: Insufficient documentation

## 2015-12-30 DIAGNOSIS — Y92410 Unspecified street and highway as the place of occurrence of the external cause: Secondary | ICD-10-CM | POA: Diagnosis not present

## 2015-12-30 DIAGNOSIS — N186 End stage renal disease: Secondary | ICD-10-CM | POA: Diagnosis not present

## 2015-12-30 DIAGNOSIS — E119 Type 2 diabetes mellitus without complications: Secondary | ICD-10-CM | POA: Insufficient documentation

## 2015-12-30 DIAGNOSIS — I5033 Acute on chronic diastolic (congestive) heart failure: Secondary | ICD-10-CM | POA: Insufficient documentation

## 2015-12-30 DIAGNOSIS — S199XXA Unspecified injury of neck, initial encounter: Secondary | ICD-10-CM | POA: Diagnosis present

## 2015-12-30 DIAGNOSIS — R51 Headache: Secondary | ICD-10-CM | POA: Diagnosis not present

## 2015-12-30 DIAGNOSIS — Y999 Unspecified external cause status: Secondary | ICD-10-CM | POA: Diagnosis not present

## 2015-12-30 DIAGNOSIS — Z79899 Other long term (current) drug therapy: Secondary | ICD-10-CM | POA: Diagnosis not present

## 2015-12-30 DIAGNOSIS — E785 Hyperlipidemia, unspecified: Secondary | ICD-10-CM | POA: Insufficient documentation

## 2015-12-30 DIAGNOSIS — Y939 Activity, unspecified: Secondary | ICD-10-CM | POA: Diagnosis not present

## 2015-12-30 DIAGNOSIS — J449 Chronic obstructive pulmonary disease, unspecified: Secondary | ICD-10-CM | POA: Insufficient documentation

## 2015-12-30 DIAGNOSIS — I132 Hypertensive heart and chronic kidney disease with heart failure and with stage 5 chronic kidney disease, or end stage renal disease: Secondary | ICD-10-CM | POA: Diagnosis not present

## 2015-12-30 DIAGNOSIS — S161XXA Strain of muscle, fascia and tendon at neck level, initial encounter: Secondary | ICD-10-CM | POA: Diagnosis not present

## 2015-12-30 DIAGNOSIS — J45909 Unspecified asthma, uncomplicated: Secondary | ICD-10-CM | POA: Diagnosis not present

## 2015-12-30 DIAGNOSIS — Z992 Dependence on renal dialysis: Secondary | ICD-10-CM | POA: Diagnosis not present
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
## 2016-01-08 ENCOUNTER — Emergency Department
Admission: EM | Admit: 2016-01-08 | Discharge: 2016-01-08 | Disposition: A | Discharge status: Home / Self Care | Payer: Medicare Other | Source: Home / Self Care | Attending: Emergency Medicine | Admitting: Emergency Medicine

## 2016-01-08 ENCOUNTER — Encounter: Payer: Self-pay | Admitting: Emergency Medicine

## 2016-01-08 DIAGNOSIS — E1122 Type 2 diabetes mellitus with diabetic chronic kidney disease: Secondary | ICD-10-CM

## 2016-01-08 DIAGNOSIS — N186 End stage renal disease: Secondary | ICD-10-CM

## 2016-01-08 DIAGNOSIS — J810 Acute pulmonary edema: Secondary | ICD-10-CM | POA: Diagnosis not present

## 2016-01-08 DIAGNOSIS — E785 Hyperlipidemia, unspecified: Secondary | ICD-10-CM | POA: Insufficient documentation

## 2016-01-08 DIAGNOSIS — G43809 Other migraine, not intractable, without status migrainosus: Secondary | ICD-10-CM

## 2016-01-08 DIAGNOSIS — I5033 Acute on chronic diastolic (congestive) heart failure: Secondary | ICD-10-CM | POA: Insufficient documentation

## 2016-01-08 DIAGNOSIS — J449 Chronic obstructive pulmonary disease, unspecified: Secondary | ICD-10-CM | POA: Insufficient documentation

## 2016-01-08 DIAGNOSIS — I132 Hypertensive heart and chronic kidney disease with heart failure and with stage 5 chronic kidney disease, or end stage renal disease: Secondary | ICD-10-CM

## 2016-01-08 DIAGNOSIS — Z87891 Personal history of nicotine dependence: Secondary | ICD-10-CM

## 2016-01-08 DIAGNOSIS — E8770 Fluid overload, unspecified: Secondary | ICD-10-CM | POA: Diagnosis not present

## 2016-01-08 DIAGNOSIS — J45909 Unspecified asthma, uncomplicated: Secondary | ICD-10-CM

## 2016-01-08 DIAGNOSIS — Z992 Dependence on renal dialysis: Secondary | ICD-10-CM

## 2016-01-08 LAB — COMPREHENSIVE METABOLIC PANEL
ALT: 14 U/L (ref 14–54)
AST: 17 U/L (ref 15–41)
Albumin: 3.7 g/dL (ref 3.5–5.0)
Alkaline Phosphatase: 51 U/L (ref 38–126)
Anion gap: 13 (ref 5–15)
BILIRUBIN TOTAL: 0.5 mg/dL (ref 0.3–1.2)
BUN: 58 mg/dL — AB (ref 6–20)
CHLORIDE: 102 mmol/L (ref 101–111)
CO2: 26 mmol/L (ref 22–32)
CREATININE: 10.85 mg/dL — AB (ref 0.44–1.00)
Calcium: 8.9 mg/dL (ref 8.9–10.3)
GFR, EST AFRICAN AMERICAN: 4 mL/min — AB (ref >60)
GFR, EST NON AFRICAN AMERICAN: 3 mL/min — AB (ref >60)
Glucose, Bld: 85 mg/dL (ref 65–99)
POTASSIUM: 4.5 mmol/L (ref 3.5–5.1)
Sodium: 141 mmol/L (ref 135–145)
TOTAL PROTEIN: 7.9 g/dL (ref 6.5–8.1)

## 2016-01-08 LAB — URINALYSIS COMPLETE WITH MICROSCOPIC (ARMC ONLY)
BILIRUBIN URINE: NEGATIVE
Glucose, UA: 150 mg/dL — AB
KETONES UR: NEGATIVE mg/dL
LEUKOCYTES UA: NEGATIVE
NITRITE: NEGATIVE
PH: 9 — AB (ref 5.0–8.0)
Specific Gravity, Urine: 1.007 (ref 1.005–1.030)

## 2016-01-08 LAB — CBC
HEMATOCRIT: 28.1 % — AB (ref 35.0–47.0)
Hemoglobin: 9.4 g/dL — ABNORMAL LOW (ref 12.0–16.0)
MCH: 29.6 pg (ref 26.0–34.0)
MCHC: 33.3 g/dL (ref 32.0–36.0)
MCV: 89 fL (ref 80.0–100.0)
PLATELETS: 256 10*3/uL (ref 150–440)
RBC: 3.16 MIL/uL — AB (ref 3.80–5.20)
RDW: 18.6 % — AB (ref 11.5–14.5)
WBC: 7.8 10*3/uL (ref 3.6–11.0)

## 2016-01-08 LAB — LIPASE, BLOOD: LIPASE: 30 U/L (ref 11–51)

## 2016-01-08 MED ORDER — METOCLOPRAMIDE HCL 5 MG/ML IJ SOLN
10.0000 mg | Freq: Once | INTRAMUSCULAR | Status: AC
  Administered 2016-01-08: 10 mg via INTRAVENOUS
  Filled 2016-01-08: qty 2

## 2016-01-08 MED ORDER — FENTANYL CITRATE (PF) 100 MCG/2ML IJ SOLN
50.0000 ug | Freq: Once | INTRAMUSCULAR | Status: AC
  Administered 2016-01-08: 50 ug via INTRAVENOUS
  Filled 2016-01-08: qty 2

## 2016-01-08 MED ORDER — DIPHENHYDRAMINE HCL 50 MG/ML IJ SOLN
25.0000 mg | Freq: Once | INTRAMUSCULAR | Status: AC
  Administered 2016-01-08: 25 mg via INTRAVENOUS
  Filled 2016-01-08: qty 1

## 2016-01-08 MED ORDER — SODIUM CHLORIDE 0.9 % IV BOLUS (SEPSIS)
500.0000 mL | Freq: Once | INTRAVENOUS | Status: AC
  Administered 2016-01-08: 500 mL via INTRAVENOUS

## 2016-01-08 NOTE — ED Provider Notes (Signed)
[facility] Emergency Department Provider Note  Time seen: 7:40 PM  I have reviewed the triage vital signs and the nursing notes.   HISTORY  Chief Complaint Headache and Emesis    HPI Jasmine Holloway is a 55 y.o. female with a past medical history of end-stage renal disease on dialysis, hypertension, asthma, COPD, diabetes who presents the emergency department with a migraine headache, nausea, vomiting, abdominal pain. According to the patient she has a history of migraine headaches, which usually present during dialysis sessions. Today during her dialysis she developed a migraine headache which she describes as a frontal headache, significant aching pain associated with nausea and vomiting which is typical for her migraine headaches. Patient states any time she vomits she develops mid abdominal pain which she is also describing. Describes abdominal pain as mild as long as she is resting, but moderate with any movement or if she eats. States she has had this abdominal pain for many years which comes and goes. Denies any fever. Patient states she is currently seeing her primary care doctor regarding her migraine headaches but has not been started on migraine medications yet.     Past Medical History  Diagnosis Date  . ESRD (end stage renal disease) on dialysis ([place])   . Hypertension   . Polycystic kidney disease   . Asthma   . COPD (chronic obstructive pulmonary disease) ([place])   . Diabetes mellitus without complication ([place])   . Neuropathy [facility])     Patient Active Problem List   Diagnosis Date Noted  . Elevated troponin 12/22/2015  . Acute on chronic diastolic CHF (congestive heart failure) ([place]) 12/22/2015  . ESRD on dialysis ([place]) 12/22/2015  . Benign essential HTN 12/22/2015  . Hyperlipidemia 12/22/2015  . Chest pain 02/24/2015    Past Surgical History  Procedure Laterality Date  . Arteriovenous graft placement Right     x3 (R forearm currently used  for access)  . Vein harvest    . Colon surgery      Current Outpatient Rx  Name  Route  Sig  Dispense  Refill  . amLODipine (NORVASC) 5 MG tablet   Oral   Take 1 tablet by mouth daily.      3   . b complex-vitamin c-folic acid (NEPHRO-VITE) 0.8 MG TABS tablet   Oral   Take 1 tablet by mouth at bedtime.         . calcium acetate (PHOSLO) 667 MG capsule   Oral   Take by mouth 3 (three) times daily with meals.         . cinacalcet (SENSIPAR) 30 MG tablet   Oral   Take 30 mg by mouth daily.         . furosemide (LASIX) 80 MG tablet   Oral   Take 1 tablet by mouth daily.         Marland Kitchen gabapentin (NEURONTIN) 300 MG capsule   Oral   Take 300 mg by mouth 3 (three) times daily.         . GuaiFENesin (MUCINEX PO)   Oral   Take by mouth.         . metoprolol succinate (TOPROL-XL) 50 MG 24 hr tablet   Oral   Take 50 mg by mouth daily. Take with or immediately following a meal.         . omeprazole (PRILOSEC) 20 MG capsule   Oral   Take 20 mg by mouth daily.         Marland Kitchen  oxyCODONE-acetaminophen (PERCOCET) 10-325 MG tablet   Oral   Take 1 tablet by mouth daily as needed.      0   . polyethylene glycol-electrolytes (NULYTELY/GOLYTELY) 420 g solution   Oral   Take 4 L by mouth once.      0   . rosuvastatin (CRESTOR) 5 MG tablet   Oral   Take 5 mg by mouth daily.         . VENTOLIN HFA 108 (90 Base) MCG/ACT inhaler   Inhalation   Inhale 2 puffs into the lungs every 6 (six) hours as needed.      6     Dispense as written.     Allergies Morphine and related; Vicodin; Buprenorphine hcl; and Codeine  Family History  Problem Relation Age of Onset  . Hypertension Brother     Social History Social History  Substance Use Topics  . Smoking status: Former Research scientist (life sciences)  . Smokeless tobacco: None  . Alcohol Use: No    Review of Systems Constitutional: Negative for fever Cardiovascular: Negative for chest pain. Respiratory: Negative for shortness of  breath. Gastrointestinal: Mild mid abdominal pain. Positive for nausea or vomiting. Negative for diarrhea. Genitourinary: Negative for dysuria. Urinates every day. Musculoskeletal: Negative for back pain Neurological: Negative for headache 10-point ROS otherwise negative.  ____________________________________________   PHYSICAL EXAM:  VITAL SIGNS: ED Triage Vitals  Enc Vitals Group     BP 01/08/16 1716 161/102 mmHg     Pulse Rate 01/08/16 1716 73     Resp 01/08/16 1716 18     Temp 01/08/16 1716 99.1 F (37.3 C)     Temp Source 01/08/16 1716 Oral     SpO2 01/08/16 1716 99 %     Weight 01/08/16 1716 193 lb (87.544 kg)     Height 01/08/16 1716 5\' 3"  (1.6 m)     Head Cir --      Peak Flow --      Pain Score 01/08/16 1717 8     Pain Loc --      Pain Edu? --      Excl. in [place]? --     Constitutional: Alert and oriented. Well appearing and in no distress. Eyes: Normal exam ENT   Head: Normocephalic and atraumatic.   Mouth/Throat: Mucous membranes are moist. Cardiovascular: Normal rate, regular rhythm. No murmur Respiratory: Normal respiratory effort without tachypnea nor retractions. Breath sounds are clear  Gastrointestinal: Soft, slight abdominal tenderness to palpation over her ventral incision/scar. No mass palpated. No rebound or guarding. No distention, otherwise benign abdominal exam. Musculoskeletal: Nontender with normal range of motion in all extremities.  Neurologic:  Normal speech and language. No gross focal neurologic deficits Skin:  Skin is warm, dry and intact.  Psychiatric: Mood and affect are normal.   ____________________________________________    INITIAL IMPRESSION / ASSESSMENT AND PLAN / ED COURSE  Pertinent labs & imaging results that were available during my care of the patient were reviewed by me and considered in my medical decision making (see chart for details).  Patient presents to the emergency department with a headache. Patient states  moderate headache, states a history of migraines which this feels identical home. States nausea and vomiting which are also common with her migraine headaches. States her migraines typically start during her dialysis-and. Today during her dialysis session a migraine started. She had to stop her dialysis session approximately one hour short due to the headache. States nausea and vomiting which again are typical of  her headache. Also states some abdominal pain which she states is typical of her vomiting. We will check labs, treat the patient's discomfort and closely monitor in the emergency department for symptom resolution.   The patient's labs are largely at her baseline.   Patient's labs are within normal limits. She did briefly desat after receiving fentanyl. Patient satting well currently on room air. Patient states her headache is much better, she does feel somewhat tired after the Benadryl. I discussed return precautions. We will discharge patient home at this time. ____________________________________________   FINAL CLINICAL IMPRESSION(S) / ED DIAGNOSES  Migraine headache   Harvest Dark, MD 01/08/16 2032

## 2016-01-08 NOTE — Discharge Instructions (Signed)

## 2016-01-08 NOTE — ED Notes (Signed)
This RN attempted to place an IV, Les, EDT-P to bedside to attempt an IV at this time.

## 2016-01-08 NOTE — ED Notes (Signed)
Pt complains of pain to abdomen for two days and today during dialysis started vomiting and complains of migraine headache.

## 2016-01-08 NOTE — ED Notes (Signed)
Pt able to eat snack she brought w/ no issue.

## 2016-01-10 ENCOUNTER — Encounter: Payer: Self-pay | Admitting: Emergency Medicine

## 2016-01-10 ENCOUNTER — Inpatient Hospital Stay
Admission: EM | Admit: 2016-01-10 | Discharge: 2016-01-17 | DRG: 640 | Disposition: A | Discharge status: Home Health | Payer: Medicare Other | Attending: Internal Medicine | Admitting: Internal Medicine

## 2016-01-10 ENCOUNTER — Emergency Department: Payer: Medicare Other

## 2016-01-10 DIAGNOSIS — Z9115 Patient's noncompliance with renal dialysis: Secondary | ICD-10-CM | POA: Diagnosis not present

## 2016-01-10 DIAGNOSIS — M1990 Unspecified osteoarthritis, unspecified site: Secondary | ICD-10-CM | POA: Diagnosis present

## 2016-01-10 DIAGNOSIS — E1143 Type 2 diabetes mellitus with diabetic autonomic (poly)neuropathy: Secondary | ICD-10-CM | POA: Diagnosis present

## 2016-01-10 DIAGNOSIS — G8929 Other chronic pain: Secondary | ICD-10-CM | POA: Diagnosis present

## 2016-01-10 DIAGNOSIS — E8770 Fluid overload, unspecified: Principal | ICD-10-CM | POA: Diagnosis present

## 2016-01-10 DIAGNOSIS — Z8249 Family history of ischemic heart disease and other diseases of the circulatory system: Secondary | ICD-10-CM | POA: Diagnosis not present

## 2016-01-10 DIAGNOSIS — K439 Ventral hernia without obstruction or gangrene: Secondary | ICD-10-CM | POA: Diagnosis present

## 2016-01-10 DIAGNOSIS — R1084 Generalized abdominal pain: Secondary | ICD-10-CM

## 2016-01-10 DIAGNOSIS — K3184 Gastroparesis: Secondary | ICD-10-CM | POA: Diagnosis present

## 2016-01-10 DIAGNOSIS — Z885 Allergy status to narcotic agent status: Secondary | ICD-10-CM | POA: Diagnosis not present

## 2016-01-10 DIAGNOSIS — Z79899 Other long term (current) drug therapy: Secondary | ICD-10-CM

## 2016-01-10 DIAGNOSIS — E1122 Type 2 diabetes mellitus with diabetic chronic kidney disease: Secondary | ICD-10-CM | POA: Diagnosis present

## 2016-01-10 DIAGNOSIS — Z6833 Body mass index (BMI) 33.0-33.9, adult: Secondary | ICD-10-CM | POA: Diagnosis not present

## 2016-01-10 DIAGNOSIS — Z87891 Personal history of nicotine dependence: Secondary | ICD-10-CM | POA: Diagnosis not present

## 2016-01-10 DIAGNOSIS — K219 Gastro-esophageal reflux disease without esophagitis: Secondary | ICD-10-CM | POA: Diagnosis present

## 2016-01-10 DIAGNOSIS — Z9109 Other allergy status, other than to drugs and biological substances: Secondary | ICD-10-CM

## 2016-01-10 DIAGNOSIS — I5033 Acute on chronic diastolic (congestive) heart failure: Secondary | ICD-10-CM | POA: Diagnosis present

## 2016-01-10 DIAGNOSIS — I10 Essential (primary) hypertension: Secondary | ICD-10-CM | POA: Diagnosis not present

## 2016-01-10 DIAGNOSIS — J449 Chronic obstructive pulmonary disease, unspecified: Secondary | ICD-10-CM | POA: Diagnosis present

## 2016-01-10 DIAGNOSIS — I132 Hypertensive heart and chronic kidney disease with heart failure and with stage 5 chronic kidney disease, or end stage renal disease: Secondary | ICD-10-CM | POA: Diagnosis present

## 2016-01-10 DIAGNOSIS — E872 Acidosis: Secondary | ICD-10-CM | POA: Diagnosis present

## 2016-01-10 DIAGNOSIS — J9601 Acute respiratory failure with hypoxia: Secondary | ICD-10-CM | POA: Diagnosis present

## 2016-01-10 DIAGNOSIS — R0600 Dyspnea, unspecified: Secondary | ICD-10-CM

## 2016-01-10 DIAGNOSIS — R4182 Altered mental status, unspecified: Secondary | ICD-10-CM

## 2016-01-10 DIAGNOSIS — E876 Hypokalemia: Secondary | ICD-10-CM | POA: Diagnosis not present

## 2016-01-10 DIAGNOSIS — E1140 Type 2 diabetes mellitus with diabetic neuropathy, unspecified: Secondary | ICD-10-CM | POA: Diagnosis present

## 2016-01-10 DIAGNOSIS — R109 Unspecified abdominal pain: Secondary | ICD-10-CM

## 2016-01-10 DIAGNOSIS — F410 Panic disorder [episodic paroxysmal anxiety] without agoraphobia: Secondary | ICD-10-CM | POA: Diagnosis present

## 2016-01-10 DIAGNOSIS — Z992 Dependence on renal dialysis: Secondary | ICD-10-CM | POA: Diagnosis not present

## 2016-01-10 DIAGNOSIS — N2581 Secondary hyperparathyroidism of renal origin: Secondary | ICD-10-CM | POA: Diagnosis present

## 2016-01-10 DIAGNOSIS — Z452 Encounter for adjustment and management of vascular access device: Secondary | ICD-10-CM

## 2016-01-10 DIAGNOSIS — G9340 Encephalopathy, unspecified: Secondary | ICD-10-CM | POA: Diagnosis not present

## 2016-01-10 DIAGNOSIS — E8779 Other fluid overload: Secondary | ICD-10-CM

## 2016-01-10 DIAGNOSIS — E875 Hyperkalemia: Secondary | ICD-10-CM | POA: Diagnosis present

## 2016-01-10 DIAGNOSIS — G43909 Migraine, unspecified, not intractable, without status migrainosus: Secondary | ICD-10-CM | POA: Diagnosis present

## 2016-01-10 DIAGNOSIS — E785 Hyperlipidemia, unspecified: Secondary | ICD-10-CM | POA: Diagnosis present

## 2016-01-10 DIAGNOSIS — J9602 Acute respiratory failure with hypercapnia: Secondary | ICD-10-CM | POA: Diagnosis present

## 2016-01-10 DIAGNOSIS — J810 Acute pulmonary edema: Secondary | ICD-10-CM | POA: Diagnosis present

## 2016-01-10 DIAGNOSIS — Q613 Polycystic kidney, unspecified: Secondary | ICD-10-CM | POA: Diagnosis not present

## 2016-01-10 DIAGNOSIS — N186 End stage renal disease: Secondary | ICD-10-CM | POA: Diagnosis present

## 2016-01-10 DIAGNOSIS — R079 Chest pain, unspecified: Secondary | ICD-10-CM | POA: Diagnosis not present

## 2016-01-10 DIAGNOSIS — D631 Anemia in chronic kidney disease: Secondary | ICD-10-CM | POA: Diagnosis present

## 2016-01-10 DIAGNOSIS — R451 Restlessness and agitation: Secondary | ICD-10-CM | POA: Diagnosis not present

## 2016-01-10 DIAGNOSIS — J9600 Acute respiratory failure, unspecified whether with hypoxia or hypercapnia: Secondary | ICD-10-CM

## 2016-01-10 HISTORY — DX: Disorder of kidney and ureter, unspecified: N28.9

## 2016-01-10 LAB — COMPREHENSIVE METABOLIC PANEL
ALBUMIN: 3.6 g/dL (ref 3.5–5.0)
ALT: 20 U/L (ref 14–54)
AST: 24 U/L (ref 15–41)
Alkaline Phosphatase: 58 U/L (ref 38–126)
Anion gap: 16 — ABNORMAL HIGH (ref 5–15)
BILIRUBIN TOTAL: 0.7 mg/dL (ref 0.3–1.2)
BUN: 76 mg/dL — AB (ref 6–20)
CHLORIDE: 103 mmol/L (ref 101–111)
CO2: 21 mmol/L — AB (ref 22–32)
Calcium: 8.9 mg/dL (ref 8.9–10.3)
Creatinine, Ser: 14.54 mg/dL — ABNORMAL HIGH (ref 0.44–1.00)
GFR calc Af Amer: 3 mL/min — ABNORMAL LOW (ref >60)
GFR calc non Af Amer: 2 mL/min — ABNORMAL LOW (ref >60)
GLUCOSE: 100 mg/dL — AB (ref 65–99)
POTASSIUM: 5 mmol/L (ref 3.5–5.1)
SODIUM: 140 mmol/L (ref 135–145)
Total Protein: 7.7 g/dL (ref 6.5–8.1)

## 2016-01-10 LAB — LIPASE, BLOOD: Lipase: 25 U/L (ref 11–51)

## 2016-01-10 LAB — CBC
HEMATOCRIT: 27.9 % — AB (ref 35.0–47.0)
Hemoglobin: 9.1 g/dL — ABNORMAL LOW (ref 12.0–16.0)
MCH: 29.2 pg (ref 26.0–34.0)
MCHC: 32.8 g/dL (ref 32.0–36.0)
MCV: 89.3 fL (ref 80.0–100.0)
Platelets: 301 10*3/uL (ref 150–440)
RBC: 3.12 MIL/uL — ABNORMAL LOW (ref 3.80–5.20)
RDW: 18.8 % — AB (ref 11.5–14.5)
WBC: 7.4 10*3/uL (ref 3.6–11.0)

## 2016-01-10 LAB — BLOOD GAS, ARTERIAL
ACID-BASE DEFICIT: 5 mmol/L — AB (ref 0.0–2.0)
ACID-BASE DEFICIT: 3.9 mmol/L — AB (ref 0.0–2.0)
ALLENS TEST (PASS/FAIL): POSITIVE — AB
BICARBONATE: 23.5 meq/L (ref 21.0–28.0)
Bicarbonate: 22.6 mEq/L (ref 21.0–28.0)
FIO2: 45
FIO2: 0.4
MECHVT: 420 mL
Mechanical Rate: 24
O2 SAT: 96.8 %
O2 Saturation: 93.1 %
PCO2 ART: 47 mmHg (ref 32.0–48.0)
PEEP/CPAP: 8 cmH2O
PEEP: 5 cmH2O
PH ART: 7.2 — AB (ref 7.350–7.450)
PH ART: 7.29 — AB (ref 7.350–7.450)
Patient temperature: 37
Patient temperature: 37
RATE: 16 resp/min
VT: 420 mL
pCO2 arterial: 60 mmHg — ABNORMAL HIGH (ref 32.0–48.0)
pO2, Arterial: 82 mmHg — ABNORMAL LOW (ref 83.0–108.0)
pO2, Arterial: 98 mmHg (ref 83.0–108.0)

## 2016-01-10 LAB — URINALYSIS COMPLETE WITH MICROSCOPIC (ARMC ONLY)
BACTERIA UA: NONE SEEN
BILIRUBIN URINE: NEGATIVE
GLUCOSE, UA: 150 mg/dL — AB
Ketones, ur: NEGATIVE mg/dL
Leukocytes, UA: NEGATIVE
Nitrite: NEGATIVE
PH: 8 (ref 5.0–8.0)
PROTEIN: 100 mg/dL — AB
Specific Gravity, Urine: 1.008 (ref 1.005–1.030)

## 2016-01-10 LAB — GLUCOSE, CAPILLARY: Glucose-Capillary: 98 mg/dL (ref 65–99)

## 2016-01-10 LAB — LACTIC ACID, PLASMA: LACTIC ACID, VENOUS: 0.8 mmol/L (ref 0.5–2.0)

## 2016-01-10 LAB — PROCALCITONIN: PROCALCITONIN: 1.76 ng/mL

## 2016-01-10 LAB — TROPONIN I: Troponin I: 0.08 ng/mL — ABNORMAL HIGH (ref <0.031)

## 2016-01-10 MED ORDER — INSULIN ASPART 100 UNIT/ML ~~LOC~~ SOLN
2.0000 [IU] | SUBCUTANEOUS | Status: DC
  Administered 2016-01-11: 2 [IU] via SUBCUTANEOUS
  Filled 2016-01-10: qty 2

## 2016-01-10 MED ORDER — PROPOFOL 1000 MG/100ML IV EMUL
INTRAVENOUS | Status: AC
  Administered 2016-01-10: 1000 mg via INTRAVENOUS
  Filled 2016-01-10: qty 100

## 2016-01-10 MED ORDER — KETAMINE HCL 50 MG/ML IJ SOLN
150.0000 mg | Freq: Once | INTRAMUSCULAR | Status: AC
  Administered 2016-01-10: 150 mg via INTRAMUSCULAR

## 2016-01-10 MED ORDER — FENTANYL CITRATE (PF) 100 MCG/2ML IJ SOLN: 50.0000 ug | Freq: Once | INTRAMUSCULAR | Status: DC

## 2016-01-10 MED ORDER — IOPAMIDOL (ISOVUE-370) INJECTION 76%
100.0000 mL | Freq: Once | INTRAVENOUS | Status: AC | PRN
  Administered 2016-01-10: 100 mL via INTRAVENOUS

## 2016-01-10 MED ORDER — LORAZEPAM 2 MG/ML IJ SOLN
2.0000 mg | Freq: Once | INTRAMUSCULAR | Status: AC
  Administered 2016-01-10: 2 mg via INTRAMUSCULAR

## 2016-01-10 MED ORDER — LORAZEPAM 2 MG/ML IJ SOLN
INTRAMUSCULAR | Status: AC
  Administered 2016-01-10: 2 mg via INTRAMUSCULAR
  Filled 2016-01-10: qty 1

## 2016-01-10 MED ORDER — IPRATROPIUM-ALBUTEROL 0.5-2.5 (3) MG/3ML IN SOLN: 3.0000 mL | Freq: Once | RESPIRATORY_TRACT | Status: DC

## 2016-01-10 MED ORDER — FAMOTIDINE IN NACL 20-0.9 MG/50ML-% IV SOLN
20.0000 mg | Freq: Two times a day (BID) | INTRAVENOUS | Status: DC
  Administered 2016-01-11: 20 mg via INTRAVENOUS
  Filled 2016-01-10 (×2): qty 50

## 2016-01-10 MED ORDER — HEPARIN SODIUM (PORCINE) 5000 UNIT/ML IJ SOLN
5000.0000 [IU] | Freq: Three times a day (TID) | INTRAMUSCULAR | Status: DC
  Administered 2016-01-11 – 2016-01-17 (×18): 5000 [IU] via SUBCUTANEOUS
  Filled 2016-01-10 (×18): qty 1

## 2016-01-10 MED ORDER — PROPOFOL 1000 MG/100ML IV EMUL
5.0000 ug/kg/min | Freq: Once | INTRAVENOUS | Status: AC
  Administered 2016-01-10: 5 ug/kg/min via INTRAVENOUS
  Filled 2016-01-10: qty 100

## 2016-01-10 MED ORDER — FUROSEMIDE 10 MG/ML IJ SOLN
80.0000 mg | Freq: Once | INTRAMUSCULAR | Status: AC
  Administered 2016-01-10: 80 mg via INTRAVENOUS
  Filled 2016-01-10: qty 8

## 2016-01-10 MED ORDER — ONDANSETRON HCL 4 MG/2ML IJ SOLN
4.0000 mg | Freq: Four times a day (QID) | INTRAMUSCULAR | Status: DC | PRN
  Administered 2016-01-14: 4 mg via INTRAVENOUS
  Filled 2016-01-10: qty 2

## 2016-01-10 MED ORDER — ALBUTEROL SULFATE (2.5 MG/3ML) 0.083% IN NEBU
2.5000 mg | INHALATION_SOLUTION | RESPIRATORY_TRACT | Status: DC | PRN
  Administered 2016-01-11 – 2016-01-16 (×3): 2.5 mg via RESPIRATORY_TRACT
  Filled 2016-01-10 (×3): qty 3

## 2016-01-10 MED ORDER — KETAMINE HCL 50 MG/ML IJ SOLN
INTRAMUSCULAR | Status: AC
  Administered 2016-01-10: 150 mg via INTRAMUSCULAR
  Filled 2016-01-10: qty 10

## 2016-01-10 MED ORDER — MIDAZOLAM HCL 2 MG/2ML IJ SOLN
INTRAMUSCULAR | Status: AC
  Filled 2016-01-10: qty 2

## 2016-01-10 MED ORDER — SENNOSIDES 8.8 MG/5ML PO SYRP
5.0000 mL | ORAL_SOLUTION | Freq: Two times a day (BID) | ORAL | Status: DC | PRN
  Filled 2016-01-10: qty 5

## 2016-01-10 MED ORDER — SODIUM CHLORIDE 0.9 % IV SOLN: 250.0000 mL | INTRAVENOUS | Status: DC | PRN

## 2016-01-10 MED ORDER — DIATRIZOATE MEGLUMINE & SODIUM 66-10 % PO SOLN
15.0000 mL | Freq: Once | ORAL | Status: AC
  Administered 2016-01-10: 15 mL via ORAL

## 2016-01-10 MED ORDER — MIDAZOLAM HCL 2 MG/2ML IJ SOLN
2.0000 mg | INTRAMUSCULAR | Status: DC | PRN
  Administered 2016-01-11 (×2): 2 mg via INTRAVENOUS
  Filled 2016-01-10 (×3): qty 2

## 2016-01-10 MED ORDER — FENTANYL 2500MCG IN NS 250ML (10MCG/ML) PREMIX INFUSION
25.0000 ug/h | INTRAVENOUS | Status: DC
  Administered 2016-01-10: 25 ug/h via INTRAVENOUS
  Administered 2016-01-11: 300 ug/h via INTRAVENOUS
  Administered 2016-01-11: 350 ug/h via INTRAVENOUS
  Administered 2016-01-12: 300 ug/h via INTRAVENOUS
  Filled 2016-01-10 (×5): qty 250

## 2016-01-10 MED ORDER — PROPOFOL 1000 MG/100ML IV EMUL
5.0000 ug/kg/min | Freq: Once | INTRAVENOUS | Status: AC
  Administered 2016-01-10: 1000 mg via INTRAVENOUS

## 2016-01-10 MED ORDER — BISACODYL 10 MG RE SUPP: 10.0000 mg | Freq: Every day | RECTAL | Status: DC | PRN

## 2016-01-10 MED ORDER — ALBUTEROL SULFATE (2.5 MG/3ML) 0.083% IN NEBU
5.0000 mg | INHALATION_SOLUTION | Freq: Once | RESPIRATORY_TRACT | Status: DC
Start: 2016-01-10 — End: 2016-01-10

## 2016-01-10 MED ORDER — FENTANYL BOLUS VIA INFUSION
50.0000 ug | INTRAVENOUS | Status: DC | PRN
  Administered 2016-01-11 (×3): 50 ug via INTRAVENOUS
  Filled 2016-01-10: qty 50

## 2016-01-10 MED ORDER — ROCURONIUM BROMIDE 50 MG/5ML IV SOLN
80.0000 mg | Freq: Once | INTRAVENOUS | Status: AC
  Administered 2016-01-10: 80 mg via INTRAVENOUS
  Filled 2016-01-10: qty 8

## 2016-01-10 MED ORDER — MIDAZOLAM HCL 2 MG/2ML IJ SOLN: 2.0000 mg | INTRAMUSCULAR | Status: DC | PRN

## 2016-01-10 MED ORDER — ACETAMINOPHEN 325 MG PO TABS
650.0000 mg | ORAL_TABLET | ORAL | Status: DC | PRN
  Administered 2016-01-15 (×2): 650 mg via ORAL
  Filled 2016-01-10: qty 2

## 2016-01-10 MED ORDER — METHYLPREDNISOLONE SODIUM SUCC 125 MG IJ SOLR
125.0000 mg | Freq: Once | INTRAMUSCULAR | Status: AC
  Administered 2016-01-10: 125 mg via INTRAVENOUS
  Filled 2016-01-10: qty 2

## 2016-01-10 MED ORDER — NITROGLYCERIN 0.4 MG SL SUBL: 0.4000 mg | SUBLINGUAL_TABLET | SUBLINGUAL | Status: DC | PRN

## 2016-01-10 NOTE — ED Notes (Signed)
Patient arrives to [facility] ED with c/o abd pain since 01-05-16. Patient was seen here prior for same on 01-08-16. Patient states that she was supposed to be at dialysis today but could not tolerate it due to the pain.

## 2016-01-10 NOTE — ED Notes (Signed)
Pt began moving continuously throughout CT visit, raising arms up at different times, propofol titrated for comfort.

## 2016-01-10 NOTE — ED Provider Notes (Signed)
[facility] Emergency Department Provider Note  ____________________________________________  Time seen: 3:40 PM  I have reviewed the triage vital signs and the nursing notes.   HISTORY  Chief Complaint Abdominal Pain    HPI Jasmine Holloway is a 55 y.o. female who complains of central abdominal pain, generalized, severe, constant but waxing and waning and worsening over the past 8 or 9 days. She reports a history of ventral hernia and feels like it that but getting worse. She's had 3 bowel movements in the last 24 hours. No vomiting. Still tolerating oral intake.  She is dialysis dependent, when 2 days ago on her usual schedule. She missed her session today because she was having too much pain.  Denies chest pain but does feel short of breath. Feels better with nasal cannula oxygen. No coughing. No dizziness.     Past Medical History  Diagnosis Date  . ESRD (end stage renal disease) on dialysis ([place])   . Hypertension   . Polycystic kidney disease   . Asthma   . COPD (chronic obstructive pulmonary disease) ([place])   . Diabetes mellitus without complication ([place])   . Neuropathy ([place])   . Renal insufficiency      Patient Active Problem List   Diagnosis Date Noted  . Elevated troponin 12/22/2015  . Acute on chronic diastolic CHF (congestive heart failure) ([place]) 12/22/2015  . ESRD on dialysis ([place]) 12/22/2015  . Benign essential HTN 12/22/2015  . Hyperlipidemia 12/22/2015  . Chest pain 02/24/2015     Past Surgical History  Procedure Laterality Date  . Arteriovenous graft placement Right     x3 (R forearm currently used for access)  . Vein harvest    . Colon surgery       Current Outpatient Rx  Name  Route  Sig  Dispense  Refill  . albuterol (PROVENTIL HFA;VENTOLIN HFA) 108 (90 Base) MCG/ACT inhaler   Inhalation   Inhale 2 puffs into the lungs every 6 (six) hours as needed for wheezing or shortness of breath.         Marland Kitchen amLODipine (NORVASC)  5 MG tablet   Oral   Take 5 mg by mouth daily.       3   . b complex-vitamin c-folic acid (NEPHRO-VITE) 0.8 MG TABS tablet   Oral   Take 1 tablet by mouth at bedtime.         . calcium acetate (PHOSLO) 667 MG capsule   Oral   Take 667 mg by mouth 3 (three) times daily with meals.          . cinacalcet (SENSIPAR) 30 MG tablet   Oral   Take 30 mg by mouth daily.         . furosemide (LASIX) 80 MG tablet   Oral   Take 80 mg by mouth daily.          Marland Kitchen gabapentin (NEURONTIN) 300 MG capsule   Oral   Take 300 mg by mouth 3 (three) times daily.         . metoprolol succinate (TOPROL-XL) 50 MG 24 hr tablet   Oral   Take 50 mg by mouth daily. Take with or immediately following a meal.         . omeprazole (PRILOSEC) 20 MG capsule   Oral   Take 40 mg by mouth daily.          Marland Kitchen oxyCODONE-acetaminophen (PERCOCET) 10-325 MG tablet   Oral   Take 1 tablet  by mouth daily as needed.      0   . rosuvastatin (CRESTOR) 5 MG tablet   Oral   Take 5 mg by mouth daily.            Allergies Morphine and related; Vicodin; Buprenorphine hcl; and Codeine   Family History  Problem Relation Age of Onset  . Hypertension Brother     Social History Social History  Substance Use Topics  . Smoking status: Former Research scientist (life sciences)  . Smokeless tobacco: None  . Alcohol Use: No    Review of Systems  Constitutional:   No fever or chills.  Eyes:   No vision changes.  ENT:   No sore throat. No rhinorrhea. Cardiovascular:   No chest pain. Respiratory:   Positive shortness of breath without cough. Gastrointestinal:   Positive generalized abdominal pain. No vomiting or diarrhea.  Genitourinary:   Negative for dysuria or difficulty urinating. Musculoskeletal:   Negative for focal pain or swelling Neurological:   Negative for headaches 10-point ROS otherwise negative.  ____________________________________________   PHYSICAL EXAM:  VITAL SIGNS: ED Triage Vitals  Enc Vitals  Group     BP 01/10/16 1500 161/98 mmHg     Pulse Rate 01/10/16 1500 92     Resp 01/10/16 1719 17     Temp --      Temp src --      SpO2 01/10/16 1500 92 %     Weight --      Height 01/10/16 1716 5\' 3"  (1.6 m)     Head Cir --      Peak Flow --      Pain Score 01/10/16 1235 10     Pain Loc --      Pain Edu? --      Excl. in [place]? --    Oxygen saturation 89% on room air on my exam. Vital signs reviewed, nursing assessments reviewed.   Constitutional:   Alert and oriented. Ill-appearing, uncomfortable Eyes:   No scleral icterus. No conjunctival pallor. PERRL. EOMI.  No nystagmus. ENT   Head:   Normocephalic and atraumatic.   Nose:   No congestion/rhinnorhea. No septal hematoma   Mouth/Throat:   Dry mucous membranes, no pharyngeal erythema. No peritonsillar mass.    Neck:   No stridor. No SubQ emphysema. No meningismus. Hematological/Lymphatic/Immunilogical:   No cervical lymphadenopathy. Cardiovascular:   RRR. Symmetric bilateral radial and DP pulses.  No murmurs.  Respiratory:   Normal respiratory effort without tachypnea nor retractions. Diffuse expiratory wheezing. By basilar crackles. Gastrointestinal:   Soft with diffuse midline tenderness over the area of extensive scar tissue from previous surgeries.. Non distended. There is no CVA tenderness.  No rebound, rigidity, or guarding. Genitourinary:   deferred Musculoskeletal:   Nontender with normal range of motion in all extremities. No joint effusions.  No lower extremity tenderness.  No edema. Neurologic:   Normal speech and language.  CN 2-10 normal. Motor grossly intact. No gross focal neurologic deficits are appreciated.  Skin:    Skin is warm, dry and intact. No rash noted.  No petechiae, purpura, or bullae.  ____________________________________________    LABS (pertinent positives/negatives) (all labs ordered are listed, but only abnormal results are displayed) Labs Reviewed  COMPREHENSIVE METABOLIC PANEL  - Abnormal; Notable for the following:    CO2 21 (*)    Glucose, Bld 100 (*)    BUN 76 (*)    Creatinine, Ser 14.54 (*)    GFR calc non Af [place] Lora  2 (*)    GFR calc Af Amer 3 (*)    Anion gap 16 (*)    All other components within normal limits  CBC - Abnormal; Notable for the following:    RBC 3.12 (*)    Hemoglobin 9.1 (*)    HCT 27.9 (*)    RDW 18.8 (*)    All other components within normal limits  URINALYSIS COMPLETEWITH MICROSCOPIC ([facility] ONLY) - Abnormal; Notable for the following:    Color, Urine STRAW (*)    APPearance CLEAR (*)    Glucose, UA 150 (*)    Hgb urine dipstick 1+ (*)    Protein, ur 100 (*)    Squamous Epithelial / LPF 0-5 (*)    All other components within normal limits  BLOOD GAS, ARTERIAL - Abnormal; Notable for the following:    pH, Arterial 7.20 (*)    pCO2 arterial 60 (*)    pO2, Arterial 82 (*)    Acid-base deficit 5.0 (*)    All other components within normal limits  LIPASE, BLOOD   ____________________________________________   EKG  Interpreted by me Normal sinus rhythm rate of 94, normal axis intervals, poor R-wave progression in anterior precordial leads, ST segments and T waves.  ____________________________________________    RADIOLOGY  CT head unremarkable CT chest abdomen pelvis negative for PE, no acute abdominal findings, consistent with pulmonary edema.  ____________________________________________   PROCEDURES CRITICAL CARE Performed by: Joni Fears, [place]   Total critical care time: 35 minutes  Critical care time was exclusive of separately billable procedures and treating other patients.  Critical care was necessary to treat or prevent imminent or life-threatening deterioration.  Critical care was time spent personally by me on the following activities: development of treatment plan with patient and/or surrogate as well as nursing, discussions with consultants, evaluation of patient's response to treatment, examination  of patient, obtaining history from patient or surrogate, ordering and performing treatments and interventions, ordering and review of laboratory studies, ordering and review of radiographic studies, pulse oximetry and re-evaluation of patient's condition.   INTUBATION Performed by: Joni Fears, Jermiah [place]  Required items: required blood products, implants, devices, and special equipment available Patient identity confirmed: provided demographic data and hospital-assigned identification number Time out: Immediately prior to procedure a "time out" was called to verify the correct patient, procedure, equipment, support staff and site/side marked as required.  Indications: Hypoxic respiratory failure   Intubation method: Glidescope Laryngoscopy   Preoxygenation: BVM  Sedatives: Ketamine Paralytic: Rocuronium   Tube Size: 7.5 cuffed  Post-procedure assessment: chest rise and ETCO2 monitor Breath sounds: equal and absent over the epigastrium Tube secured with: ETT holder Chest x-ray interpreted by radiologist and me.  Chest x-ray findings: endotracheal tube in appropriate position  Patient tolerated the procedure well with no immediate complications.   Peripheral IV insertion by physician Indication: Multiple failed attempts by nursing staff, need for IV access and/or blood samples for workup Performed under continuous real-time ultrasound visualization Area cleaned with chlorhexidine. 20-gauge IV successfully placed in the left antecubital fossa. 1 attempt, no complications, EBL 0.    ____________________________________________   INITIAL IMPRESSION / ASSESSMENT AND PLAN / ED COURSE  Pertinent labs & imaging results that were available during my care of the patient were reviewed by me and considered in my medical decision making (see chart for details).  Patient presents with her dialysis. Abdominal pain. She does complain of some shortness of breath, which is likely due to some  mild volume overload.  We'll give her nitroglycerin and Lasix as she does still make urine to help offload her lungs while getting an abdominal CT. If workup is negative I think the patient actually will be stable for discharge home for a work in dialysis session tomorrow.    ----------------------------------------- 5:29 PM on 01/10/2016 -----------------------------------------  At 4:40 PM the patient called out that she was having severe shortness of breath. She seemed to be very anxious and having a panic attack and hyperventilating. She stated that she felt dizzy. She is concerned that she was having a reaction to oral contrast. This seem highly unlikely and she had no skin findings or hypertension to suggest anaphylaxis. She was still having diffuse expiratory wheezing, but oxygen saturation was 100%. However, after several minutes, despite given the patient IM Ativan, she lost consciousness. She then rapidly became hypoxic with an oxygen saturation in the 40s. She did maintain spontaneous respirations but they were ineffective, possibly because of body habitus. We assisted her respirations with bag valve mask for a few minutes, but she did not regain consciousness or become more alert or start breathing better on her own so she was intubated for respiratory failure. Due to a critically ill patient in the next room, the nurse was unable to provide the patient the nitroglycerin and Lasix.  At this moment is unclear what the cause of her severe symptoms is. Her initial presentation had suggested mild volume overload due to end-stage renal disease and severe abdominal pain related to prior surgeries and hernia. We'll proceed with CT scan of the head, CT angiogram of the chest, and CT scan of the abdomen and pelvis for further evaluation.    ----------------------------------------- 7:48 PM on 01/10/2016 -----------------------------------------  Workup reveals pulmonary edema, consistent with  acute respiratory failure with hypoxia and hypercapnia and respiratory acidosis likely from volume overload due to noncompliance with dialysis. On further history from family, patient missed her dialysis session 2 days ago and is therefore gone at least 4 days without dialysis. We'll admit to critical care for further management.   ----------------------------------------- 8:11 PM on 01/10/2016 -----------------------------------------  Discussed with critical care service who will plan to admit.    ____________________________________________   FINAL CLINICAL IMPRESSION(S) / ED DIAGNOSES  Final diagnoses:  Acute respiratory failure with hypoxia and hypercapnia (HCC)  Acute pulmonary edema (HCC)  Other hypervolemia  ESRD on dialysis [facility])       Portions of this note were generated with dragon dictation software. Dictation errors may occur despite best attempts at proofreading.   Carrie Mew, MD 01/10/16 2011

## 2016-01-10 NOTE — ED Notes (Signed)
Pt fidgeting and propofol adjusted for comfort

## 2016-01-10 NOTE — H&P (Signed)
PULMONARY / CRITICAL CARE MEDICINE   Name: Jasmine Holloway MRN: [id number] DOB: [birth year]    ADMISSION DATE:  01/10/2016   REFERRING MD: ED  CHIEF COMPLAINT:  Abdominal pain and altered mental status  HISTORY OF PRESENT ILLNESS:   This is a 55 year old African-American female with a past medical history of end-stage renal disease on hemodialysis, type 2 diabetes, hypertension, polycystic kidney disease, asthma, COPD, and diabetic neuropathy who presented to the ED with complaints of abdominal pain, nausea, vomiting and migraine headaches that started today. History is obtained from EMS and ED records as patient is currently intubated. Per ED records, patient stated that she skipped dialysis today because she was in so much pain. Described pain as mostly located around the central region of her abdomen, severe, constant, and has gotten worse over the last 8 to 9 days. She has a ventral hernia and has had abdominal surgery before. She did not relay any relieving or aggravating factors. She reported 3 bowel movements prior to ED arrival. She reports having dialysis on Wednesday. She is also complaining of dyspnea that is mildly worse with exertion. At the ED, patient became very anxious, and her respirations became labored with worsening shortness of breath. Her chest x-ray showed pulmonary edema and when she was placed on nasal cannula, patient kept complaining that she could not breath hence the ED MD intubated patient emergently and PCCM was consulted for admission. Patient is now intubated and sedated. Heart blood pressure is still elevated with systolic in the 0000000.  PAST MEDICAL HISTORY :  She  has a past medical history of ESRD (end stage renal disease) on dialysis ([place]); Hypertension; Polycystic kidney disease; Asthma; COPD (chronic obstructive pulmonary disease) ([place]); Diabetes mellitus without complication ([place]); Neuropathy ([place]); and Renal insufficiency.  PAST SURGICAL HISTORY: She  has past  surgical history that includes Arteriovenous graft placement (Right); Vein harvest; and Colon surgery.  Allergies  Allergen Reactions  . Morphine And Related Nausea And Vomiting  . Vicodin [Hydrocodone-Acetaminophen] Nausea And Vomiting  . Buprenorphine Hcl Nausea And Vomiting  . Codeine Nausea And Vomiting    No current facility-administered medications on file prior to encounter.   Current Outpatient Prescriptions on File Prior to Encounter  Medication Sig  . amLODipine (NORVASC) 5 MG tablet Take 5 mg by mouth daily.   Marland Kitchen b complex-vitamin c-folic acid (NEPHRO-VITE) 0.8 MG TABS tablet Take 1 tablet by mouth at bedtime.  . calcium acetate (PHOSLO) 667 MG capsule Take 667 mg by mouth 3 (three) times daily with meals.   . cinacalcet (SENSIPAR) 30 MG tablet Take 30 mg by mouth daily.  . furosemide (LASIX) 80 MG tablet Take 80 mg by mouth daily.   Marland Kitchen gabapentin (NEURONTIN) 300 MG capsule Take 300 mg by mouth 2 (two) times daily.   . metoprolol succinate (TOPROL-XL) 50 MG 24 hr tablet Take 50 mg by mouth daily. Take with or immediately following a meal.  . omeprazole (PRILOSEC) 20 MG capsule Take 40 mg by mouth daily.   Marland Kitchen oxyCODONE-acetaminophen (PERCOCET) 10-325 MG tablet Take 1 tablet by mouth daily as needed.  . rosuvastatin (CRESTOR) 5 MG tablet Take 5 mg by mouth daily.    FAMILY HISTORY:  Her has no family status information on file.   SOCIAL HISTORY: She  reports that she has quit smoking. She does not have any smokeless tobacco history on file. She reports that she does not drink alcohol or use illicit drugs.  REVIEW OF SYSTEMS:   Unable  to obtain as patient is sedated and intubated  SUBJECTIVE:   VITAL SIGNS: BP 144/89 mmHg  Pulse 76  Resp 18  Ht 5\' 3"  (1.6 m)  SpO2 100%  HEMODYNAMICS:    VENTILATOR SETTINGS: Vent Mode:  [-] PRVC FiO2 (%):  [40 %-45 %] 40 % Set Rate:  [16 bmp-24 bmp] 24 bmp Vt Set:  [420 mL] 420 mL PEEP:  [5 cmH20-8 cmH20] 5 cmH20  INTAKE /  OUTPUT:    PHYSICAL EXAMINATION: General: Chronically ill-looking, no distress Neuro:  Awakens to noxious stimulus, moves all extremities to pain HEENT: Pupils pinpoint and sluggish. Conjunctivae pink. Trachea midline Cardiovascular:  Rate and rhythm regular, S1, S2, no murmur, regurg or gallop Lungs: Clear to auscultation bilaterally without wheezes or rhonchi Abdomen: No palpable organomegaly, normal bowel sounds Musculoskeletal:  Positive range of motion in upper and lower extremities Extremities: +2 pulses, trace edema Skin:  Warm and dry  LABS:  BMET  Recent Labs Lab 01/08/16 1723 01/10/16 1308  NA 141 140  K 4.5 5.0  CL 102 103  CO2 26 21*  BUN 58* 76*  CREATININE 10.85* 14.54*  GLUCOSE 85 100*    Electrolytes  Recent Labs Lab 01/08/16 1723 01/10/16 1308  CALCIUM 8.9 8.9    CBC  Recent Labs Lab 01/08/16 1723 01/10/16 1308  WBC 7.8 7.4  HGB 9.4* 9.1*  HCT 28.1* 27.9*  PLT 256 301    Coag's No results for input(s): APTT, INR in the last 168 hours.  Sepsis Markers  Recent Labs Lab 01/10/16 2107 01/10/16 2139  LATICACIDVEN  --  0.8  PROCALCITON 1.76  --     ABG  Recent Labs Lab 01/10/16 1743 01/10/16 2300  PHART 7.20* 7.29*  PCO2ART 60* 47  PO2ART 82* 98    Liver Enzymes  Recent Labs Lab 01/08/16 1723 01/10/16 1308  AST 17 24  ALT 14 20  ALKPHOS 51 58  BILITOT 0.5 0.7  ALBUMIN 3.7 3.6    Cardiac Enzymes  Recent Labs Lab 01/10/16 2107  TROPONINI 0.08*    Glucose  Recent Labs Lab 01/10/16 2306  GLUCAP 98    Imaging Dg Abd 1 View  01/10/2016  CLINICAL DATA:  NG tube placement EXAM: ABDOMEN - 1 VIEW COMPARISON:  None. FINDINGS: NG tube tip is in the mid stomach. Nonobstructive bowel gas pattern. IMPRESSION: NG tube tip in the mid stomach. Electronically Signed   By: Rolm Baptise M.D.   On: 01/10/2016 20:52   Ct Head Wo Contrast  01/10/2016  CLINICAL DATA:  Sudden loss of consciousness.  Worsening dyspnea. EXAM:  CT HEAD WITHOUT CONTRAST TECHNIQUE: Contiguous axial images were obtained from the base of the skull through the vertex without intravenous contrast. COMPARISON:  12/30/2015 FINDINGS: No evidence for acute intracranial abnormalities. No evidence for acute cortical infarct, intracranial hemorrhage or mass. Physiologic calcifications noted within the basal ganglia. Mild cerebral atrophy. The paranasal sinuses and M mastoid air cells are clear. The calvarium appears intact IMPRESSION: 1. No acute intracranial abnormalities. 2. Mild cerebral atrophy. Electronically Signed   By: Kerby Moors M.D.   On: 01/10/2016 19:13   Ct Angio Chest Pe W/cm &/or Wo Cm  01/10/2016  CLINICAL DATA:  Sudden low loss of consciousness. Worsening shortness of breath. EXAM: CT ANGIOGRAPHY CHEST CT ABDOMEN AND PELVIS WITH CONTRAST TECHNIQUE: Multidetector CT imaging of the chest was performed using the standard protocol during bolus administration of intravenous contrast. Multiplanar CT image reconstructions and MIPs were obtained to evaluate  the vascular anatomy. Multidetector CT imaging of the abdomen and pelvis was performed using the standard protocol during bolus administration of intravenous contrast. CONTRAST:  100 cc Isovue 370 COMPARISON:  None. FINDINGS: CTA CHEST FINDINGS Mediastinum/Nodes: Heart is enlarged. Aorta is normal caliber. Mildly prominent mediastinal and bilateral hilar lymph nodes. Index prevascular lymph node measures 14 mm in short axis diameter. No evidence of pulmonary embolus. Lungs/Pleura: Small bilateral pleural effusions. Diffuse ground-glass opacities and more confluent airspace opacities throughout the lungs, most compatible with edema. Dependent atelectasis or consolidation in the lower lobes dependently. Musculoskeletal: Chest wall soft tissues are unremarkable. No acute bony abnormality or focal bone lesion. CT ABDOMEN and PELVIS FINDINGS Hepatobiliary: Mild periportal edema. No focal hepatic  abnormality. Gallbladder unremarkable. Pancreas: No focal abnormality or ductal dilatation. Spleen: No focal abnormality.  Normal size. Adrenals/Urinary Tract: Cortical thinning within the kidneys bilaterally. No masses. No hydronephrosis. Adrenal glands and urinary bladder unremarkable. Stomach/Bowel: Scattered right colonic diverticulosis. No active diverticulitis. Small bowel and stomach decompressed. Vascular/Lymphatic: Normal caliber.  No aneurysm.  No adenopathy. Reproductive: Uterus and adnexa unremarkable.  No mass. Other: No free fluid or free air. Musculoskeletal: No acute bony abnormality or focal bone lesion. Review of the MIP images confirms the above findings. IMPRESSION: No evidence of pulmonary embolus. Cardiomegaly. Diffuse ground-glass airspace opacities throughout the lungs compatible with edema. Small bilateral pleural effusions. More consolidative airspace opacities posteriorly in the lower lobes bilaterally which could reflect dependent atelectasis or pneumonia. Mildly prominent mediastinal and bilateral hilar lymph nodes, likely reactive. Mild periportal edema. This may be related to intrinsic liver disease or passive congestion. Scattered right colonic diverticula.  No active diverticulitis. Electronically Signed   By: Rolm Baptise M.D.   On: 01/10/2016 19:13   Ct Abdomen Pelvis W Contrast  01/10/2016  CLINICAL DATA:  Sudden low loss of consciousness. Worsening shortness of breath. EXAM: CT ANGIOGRAPHY CHEST CT ABDOMEN AND PELVIS WITH CONTRAST TECHNIQUE: Multidetector CT imaging of the chest was performed using the standard protocol during bolus administration of intravenous contrast. Multiplanar CT image reconstructions and MIPs were obtained to evaluate the vascular anatomy. Multidetector CT imaging of the abdomen and pelvis was performed using the standard protocol during bolus administration of intravenous contrast. CONTRAST:  100 cc Isovue 370 COMPARISON:  None. FINDINGS: CTA CHEST  FINDINGS Mediastinum/Nodes: Heart is enlarged. Aorta is normal caliber. Mildly prominent mediastinal and bilateral hilar lymph nodes. Index prevascular lymph node measures 14 mm in short axis diameter. No evidence of pulmonary embolus. Lungs/Pleura: Small bilateral pleural effusions. Diffuse ground-glass opacities and more confluent airspace opacities throughout the lungs, most compatible with edema. Dependent atelectasis or consolidation in the lower lobes dependently. Musculoskeletal: Chest wall soft tissues are unremarkable. No acute bony abnormality or focal bone lesion. CT ABDOMEN and PELVIS FINDINGS Hepatobiliary: Mild periportal edema. No focal hepatic abnormality. Gallbladder unremarkable. Pancreas: No focal abnormality or ductal dilatation. Spleen: No focal abnormality.  Normal size. Adrenals/Urinary Tract: Cortical thinning within the kidneys bilaterally. No masses. No hydronephrosis. Adrenal glands and urinary bladder unremarkable. Stomach/Bowel: Scattered right colonic diverticulosis. No active diverticulitis. Small bowel and stomach decompressed. Vascular/Lymphatic: Normal caliber.  No aneurysm.  No adenopathy. Reproductive: Uterus and adnexa unremarkable.  No mass. Other: No free fluid or free air. Musculoskeletal: No acute bony abnormality or focal bone lesion. Review of the MIP images confirms the above findings. IMPRESSION: No evidence of pulmonary embolus. Cardiomegaly. Diffuse ground-glass airspace opacities throughout the lungs compatible with edema. Small bilateral pleural effusions. More consolidative airspace opacities  posteriorly in the lower lobes bilaterally which could reflect dependent atelectasis or pneumonia. Mildly prominent mediastinal and bilateral hilar lymph nodes, likely reactive. Mild periportal edema. This may be related to intrinsic liver disease or passive congestion. Scattered right colonic diverticula.  No active diverticulitis. Electronically Signed   By: Rolm Baptise M.D.    On: 01/10/2016 19:13   Dg Chest Port 1 View  01/10/2016  CLINICAL DATA:  Intubation.  Shortness of breath EXAM: PORTABLE CHEST 1 VIEW COMPARISON:  12/21/2015 FINDINGS: Endotracheal tube is 13 mm above the carina. Cardiomegaly. Bilateral airspace opacities are noted, right greater than left, likely edema. No visible effusion. No acute bony abnormality. IMPRESSION: Cardiomegaly with bilateral airspace opacities, right greater than left, most compatible with edema/ CHF. Endotracheal tube 13 mm above the carina. Electronically Signed   By: Rolm Baptise M.D.   On: 01/10/2016 17:33   STUDIES:  Ultrasound abdomen 01/11/2016  CULTURES: Blood cultures 2. MRSA screen negative. Urine culture  ANTIBIOTICS: None  SIGNIFICANT EVENTS: 01/10/2016 ED with abdominal pain, became agitated, intubated for airway protection  LINES/TUBES: Peripheral IVs Dialysis shunt  DISCUSSION: 55 year old African-American female presenting with acute pulmonary edema secondary to volume overload from non-adherence to dialysis, acute hypoxic and hypercarbic respiratory failure, and abdominal pain of unknown etiology. Abdominal pain likely due to diabetic gastroparesis than to any infectious process.  ASSESSMENT / PLAN:  PULMONARY A: Acute hypoxic/hypercarbic respiratory failure. History of COPD/asthma Pulmonary edema P:   -Continue full vent support with current settings. -Stat ABG reviewed, vent settings made. -Repeat ABG reviewed, no change. Vent settings. -Nebulized steroids and bronchodilators -Daily chest x-ray. -Daily ABG -Lasix 80 mg IV 1 given  CARDIOVASCULAR A:  Hypertension Volume overload secondary to noncompliance with dialysis P:  -Hemodynamics per ICU protocol -Hydralazine 10 mg IV every 4 hours when necessary for systolic blood pressure greater than 170 -Dialysis and diuresis per nephrology  RENAL A:   End-stage renal disease on dialysis Polycystic kidney disease Severe  hypokalemia P:   -Stat nephrology consult for inpatient hemodialysis-spoke with on-call nephrologists. -D50 with 10 units of regular insulin 1. -Calcium gluconate 1 g IV 1. -Follow-up repeat BMP   GASTROINTESTINAL A:   Abdominal pain-unclear etiology;  rule out diabetic gastroparesis History of ventral hernia P:   -Abdominal ultrasound. -PPI stress ulcer prophylaxis -Reglan 5 mg every 6 hours when necessary for nausea and vomiting  HEMATOLOGIC A:   Chronic anemia P:  -Trend CBC. -Heparin for VTE prophylaxis  INFECTIOUS A:   No acute issues-pro-calcitonin mildly elevated P:   -Follow-up cultures -No antibiotics for now  ENDOCRINE A:   Type 2 diabetes mellitus P:   -Blood glucose monitoring with sliding scale insulin coverage  NEUROLOGIC A:   Altered mental status. Acute agitation and anxiety P:   RASS goal: -1 to -2  -Fentanyl and when necessary Versed for vent sedation   FAMILY and disposition No family at bedside. Further changes in treatment plan pending clinical course and diagnostics  Critical care time spent examining patient, establishing treatment plan, managing vent, reviewing history and labs, CXR, and ABG interpretation is 60 minutes  Magdalene S. [facility] ANP-BC Pulmonary and [place] Pager [phone] or [phone]  01/10/2016, 11:30 PM

## 2016-01-10 NOTE — ED Notes (Signed)
Pt to CT w/ respiratory at bedside.

## 2016-01-10 NOTE — ED Notes (Addendum)
Pt anxious, with labored breathing c/o SOB upon returning from chest x-ray. Pt pulled off her nasal canula,this RN attempted to calm pt down and hook her nasal canula back up, pt pulled it off again stating repeatedly "I can not breathe".This RN attempted to start pt on non-rebreather, pt pulled off non-rebreather, and this RN called the MD to bedside. MD arrived immediately attempted to restart pt on nasal canula, pt was still anxious, hyperventilating, and stating she could not breathe, MD ordered 2 mg of ativan at 1642 it was administered. Pt was placed back on nasal canula, but became unresponsive, foaming at the mouth at 1645 and O2 sats decreased rapidly from 100% to 38% at 1647. MD began bagging, and respiratory called to bedside.  Pt bagged to 100% at 1650, and intubation procedures initiated, with pt still being unresponsive HR 110, BP 103/75. 150 mg of ketamine administered at 1658, followed by 80 mg of rocuronium at 1658. Pt intubated 22 at the right lip successfully at 1700, lung sounds auscultated, capnography verified, as well as chest x-ray confirmed placement.

## 2016-01-10 NOTE — ED Notes (Signed)
Attempted to call emergency contact listed for patient to notify of patient being admitted to hospital.  No answer, message left for person to call back.

## 2016-01-11 ENCOUNTER — Inpatient Hospital Stay: Payer: Medicare Other

## 2016-01-11 ENCOUNTER — Inpatient Hospital Stay
Admit: 2016-01-11 | Discharge: 2016-01-11 | Disposition: A | Discharge status: Home / Self Care | Payer: Medicare Other | Attending: Adult Health | Admitting: Adult Health

## 2016-01-11 ENCOUNTER — Inpatient Hospital Stay: Admit: 2016-01-11 | Payer: Medicare Other

## 2016-01-11 DIAGNOSIS — J9601 Acute respiratory failure with hypoxia: Secondary | ICD-10-CM

## 2016-01-11 DIAGNOSIS — J810 Acute pulmonary edema: Secondary | ICD-10-CM | POA: Insufficient documentation

## 2016-01-11 DIAGNOSIS — R109 Unspecified abdominal pain: Secondary | ICD-10-CM | POA: Insufficient documentation

## 2016-01-11 LAB — COMPREHENSIVE METABOLIC PANEL
ALBUMIN: 3.6 g/dL (ref 3.5–5.0)
ALT: 37 U/L (ref 14–54)
AST: 31 U/L (ref 15–41)
Alkaline Phosphatase: 53 U/L (ref 38–126)
Anion gap: 16 — ABNORMAL HIGH (ref 5–15)
BUN: 72 mg/dL — AB (ref 6–20)
CHLORIDE: 102 mmol/L (ref 101–111)
CO2: 21 mmol/L — AB (ref 22–32)
CREATININE: 12.4 mg/dL — AB (ref 0.44–1.00)
Calcium: 8.4 mg/dL — ABNORMAL LOW (ref 8.9–10.3)
GFR calc Af Amer: 3 mL/min — ABNORMAL LOW (ref >60)
GFR, EST NON AFRICAN AMERICAN: 3 mL/min — AB (ref >60)
GLUCOSE: 122 mg/dL — AB (ref 65–99)
Potassium: 5.6 mmol/L — ABNORMAL HIGH (ref 3.5–5.1)
SODIUM: 139 mmol/L (ref 135–145)
Total Bilirubin: 0.4 mg/dL (ref 0.3–1.2)
Total Protein: 7.7 g/dL (ref 6.5–8.1)

## 2016-01-11 LAB — BLOOD GAS, ARTERIAL
Acid-base deficit: 5.9 mmol/L — ABNORMAL HIGH (ref 0.0–2.0)
Allens test (pass/fail): POSITIVE — AB
BICARBONATE: 19.5 meq/L — AB (ref 21.0–28.0)
FIO2: 0.4
MECHANICAL RATE: 24
MECHVT: 420 mL
O2 Saturation: 97 %
PEEP: 5 cmH2O
Patient temperature: 37
pCO2 arterial: 37 mmHg (ref 32.0–48.0)
pH, Arterial: 7.33 — ABNORMAL LOW (ref 7.350–7.450)
pO2, Arterial: 97 mmHg (ref 83.0–108.0)

## 2016-01-11 LAB — GLUCOSE, CAPILLARY
GLUCOSE-CAPILLARY: 106 mg/dL — AB (ref 65–99)
GLUCOSE-CAPILLARY: 84 mg/dL (ref 65–99)
Glucose-Capillary: 111 mg/dL — ABNORMAL HIGH (ref 65–99)
Glucose-Capillary: 122 mg/dL — ABNORMAL HIGH (ref 65–99)
Glucose-Capillary: 83 mg/dL (ref 65–99)
Glucose-Capillary: 86 mg/dL (ref 65–99)

## 2016-01-11 LAB — MRSA PCR SCREENING: MRSA by PCR: NEGATIVE

## 2016-01-11 LAB — CBC
HCT: 24.3 % — ABNORMAL LOW (ref 35.0–47.0)
HCT: 26.9 % — ABNORMAL LOW (ref 35.0–47.0)
HEMOGLOBIN: 8.1 g/dL — AB (ref 12.0–16.0)
Hemoglobin: 8.9 g/dL — ABNORMAL LOW (ref 12.0–16.0)
MCH: 29.7 pg (ref 26.0–34.0)
MCH: 29.2 pg (ref 26.0–34.0)
MCHC: 33.3 g/dL (ref 32.0–36.0)
MCHC: 33.2 g/dL (ref 32.0–36.0)
MCV: 89 fL (ref 80.0–100.0)
MCV: 88 fL (ref 80.0–100.0)
PLATELETS: 302 10*3/uL (ref 150–440)
Platelets: 264 10*3/uL (ref 150–440)
RBC: 2.73 MIL/uL — ABNORMAL LOW (ref 3.80–5.20)
RBC: 3.05 MIL/uL — ABNORMAL LOW (ref 3.80–5.20)
RDW: 18.7 % — ABNORMAL HIGH (ref 11.5–14.5)
RDW: 18.5 % — AB (ref 11.5–14.5)
WBC: 5.8 10*3/uL (ref 3.6–11.0)
WBC: 4.6 10*3/uL (ref 3.6–11.0)

## 2016-01-11 LAB — BASIC METABOLIC PANEL
Anion gap: 15 (ref 5–15)
BUN: 86 mg/dL — AB (ref 6–20)
CHLORIDE: 102 mmol/L (ref 101–111)
CO2: 18 mmol/L — AB (ref 22–32)
CREATININE: 15.75 mg/dL — AB (ref 0.44–1.00)
Calcium: 7.8 mg/dL — ABNORMAL LOW (ref 8.9–10.3)
GFR calc Af Amer: 3 mL/min — ABNORMAL LOW (ref >60)
GFR calc non Af Amer: 2 mL/min — ABNORMAL LOW (ref >60)
Glucose, Bld: 118 mg/dL — ABNORMAL HIGH (ref 65–99)
Potassium: 7.5 mmol/L (ref 3.5–5.1)
SODIUM: 135 mmol/L (ref 135–145)

## 2016-01-11 LAB — TROPONIN I
Troponin I: 0.07 ng/mL — ABNORMAL HIGH (ref <0.031)
Troponin I: 0.05 ng/mL — ABNORMAL HIGH (ref <0.031)

## 2016-01-11 LAB — MAGNESIUM: MAGNESIUM: 2.2 mg/dL (ref 1.7–2.4)

## 2016-01-11 LAB — PHOSPHORUS: PHOSPHORUS: 8.6 mg/dL — AB (ref 2.5–4.6)

## 2016-01-11 MED ORDER — HYDRALAZINE HCL 20 MG/ML IJ SOLN
10.0000 mg | INTRAMUSCULAR | Status: DC | PRN
  Administered 2016-01-11 – 2016-01-15 (×10): 10 mg via INTRAVENOUS
  Filled 2016-01-11 (×10): qty 1

## 2016-01-11 MED ORDER — IPRATROPIUM-ALBUTEROL 0.5-2.5 (3) MG/3ML IN SOLN
3.0000 mL | Freq: Four times a day (QID) | RESPIRATORY_TRACT | Status: DC
  Administered 2016-01-11 – 2016-01-12 (×4): 3 mL via RESPIRATORY_TRACT
  Filled 2016-01-11 (×4): qty 3

## 2016-01-11 MED ORDER — PRO-STAT SUGAR FREE PO LIQD
30.0000 mL | Freq: Two times a day (BID) | ORAL | Status: DC
  Administered 2016-01-11 (×2): 30 mL

## 2016-01-11 MED ORDER — BUDESONIDE 0.25 MG/2ML IN SUSP
0.2500 mg | Freq: Two times a day (BID) | RESPIRATORY_TRACT | Status: DC
  Administered 2016-01-11 – 2016-01-16 (×12): 0.25 mg via RESPIRATORY_TRACT
  Filled 2016-01-11 (×12): qty 2

## 2016-01-11 MED ORDER — VITAL HIGH PROTEIN PO LIQD
1000.0000 mL | ORAL | Status: DC
  Administered 2016-01-11 (×3)
  Administered 2016-01-11: 1000 mL
  Administered 2016-01-11 – 2016-01-12 (×10)

## 2016-01-11 MED ORDER — PROPOFOL 1000 MG/100ML IV EMUL
5.0000 ug/kg/min | Freq: Once | INTRAVENOUS | Status: AC
  Administered 2016-01-11: 25 ug/kg/min via INTRAVENOUS
  Filled 2016-01-11: qty 100

## 2016-01-11 MED ORDER — INSULIN REGULAR HUMAN 100 UNIT/ML IJ SOLN
10.0000 [IU] | Freq: Once | INTRAMUSCULAR | Status: AC
  Administered 2016-01-11: 10 [IU] via INTRAVENOUS
  Filled 2016-01-11: qty 0.1

## 2016-01-11 MED ORDER — FAMOTIDINE IN NACL 20-0.9 MG/50ML-% IV SOLN
20.0000 mg | INTRAVENOUS | Status: DC
  Administered 2016-01-12 – 2016-01-13 (×2): 20 mg via INTRAVENOUS
  Filled 2016-01-11 (×2): qty 50

## 2016-01-11 MED ORDER — ANTISEPTIC ORAL RINSE SOLUTION (CORINZ)
7.0000 mL | Freq: Four times a day (QID) | OROMUCOSAL | Status: DC
  Administered 2016-01-11: 7 mL via OROMUCOSAL
  Filled 2016-01-11 (×2): qty 7

## 2016-01-11 MED ORDER — PROPOFOL 1000 MG/100ML IV EMUL
5.0000 ug/kg/min | INTRAVENOUS | Status: DC
  Administered 2016-01-11: 35 ug/kg/min via INTRAVENOUS
  Administered 2016-01-11: 30 ug/kg/min via INTRAVENOUS
  Administered 2016-01-11: 40 ug/kg/min via INTRAVENOUS
  Administered 2016-01-12: 32 ug/kg/min via INTRAVENOUS
  Filled 2016-01-11 (×5): qty 100

## 2016-01-11 MED ORDER — ANTISEPTIC ORAL RINSE SOLUTION (CORINZ)
7.0000 mL | OROMUCOSAL | Status: DC
  Administered 2016-01-11 – 2016-01-12 (×10): 7 mL via OROMUCOSAL
  Filled 2016-01-11 (×19): qty 7

## 2016-01-11 MED ORDER — SODIUM CHLORIDE 0.9 % IV SOLN
1.0000 g | Freq: Once | INTRAVENOUS | Status: AC
  Administered 2016-01-11: 1 g via INTRAVENOUS
  Filled 2016-01-11: qty 10

## 2016-01-11 MED ORDER — CHLORHEXIDINE GLUCONATE 0.12% ORAL RINSE (MEDLINE KIT)
15.0000 mL | Freq: Two times a day (BID) | OROMUCOSAL | Status: DC
  Administered 2016-01-11 – 2016-01-12 (×3): 15 mL via OROMUCOSAL
  Filled 2016-01-11 (×4): qty 15

## 2016-01-11 MED ORDER — DEXTROSE 50 % IV SOLN
50.0000 mL | Freq: Once | INTRAVENOUS | Status: AC
  Administered 2016-01-11: 50 mL via INTRAVENOUS
  Filled 2016-01-11: qty 50

## 2016-01-11 NOTE — Progress Notes (Signed)
PRE DIALYSIS ASSESSMENT 

## 2016-01-11 NOTE — Progress Notes (Signed)
POST DIALYSIS ASSESSMENT 

## 2016-01-11 NOTE — Plan of Care (Signed)
Problem: Education: Goal: Knowledge of [facility] Education information/materials will improve Outcome: Not Met (add Reason) Pt intubated and sedated     Problem: Coping: Goal: Level of anxiety will decrease Outcome: Progressing Adequate sedation maintained  Problem: Nutritional: Goal: Intake of prescribed amount of daily calories will improve Outcome: Progressing Tube feeds ordered     Problem: Education: Goal: Knowledge of disease and its progression will improve Outcome: Not Met (add Reason) Pt intubated and sedated, no education provided at this time  Problem: Fluid Volume: Goal: Compliance with measures to maintain balanced fluid volume will improve Outcome: Progressing Strict I&O

## 2016-01-11 NOTE — Plan of Care (Signed)
Problem: Activity: Goal: Ability to tolerate increased activity will improve Outcome: Not Progressing Pt very agitated with stimulation   Problem: Coping: Goal: Level of anxiety will decrease Outcome: Progressing Decreased anxiety with adequate sedation   Problem: Nutritional: Goal: Intake of prescribed amount of daily calories will improve Outcome: Not Met (add Reason) Awaiting tube feed from nutrition   Problem: Education: Goal: Knowledge of disease and its progression will improve Outcome: Not Progressing Pt sedated and intubated, no education provided at this time

## 2016-01-11 NOTE — Progress Notes (Signed)
HEMODIALYSIS STARTED.

## 2016-01-11 NOTE — Progress Notes (Signed)
Spoke to Dr. Holley Raring about patient's blood pressure on dialysis. Around 10:00, confirmed with Dr. Ashby Dawes that central line able to used, and lines switched to central line. Dialysis also started around that time (see exact times in dialysis flowsheets/notes). Patient became hypotensive starting around 10:15. Propofol held, Dialysis RN adjusted settings, and MD notified (Dr. Juleen China). MD ordered RNs to continue adjusting sedation and dialysis settings and not start levophed at this time. Blood pressure improved (see vital sign flowsheets) on subsequent blood pressure checks over roughly next thirty mintues. RNs continue to monitor.

## 2016-01-11 NOTE — Progress Notes (Signed)
Critical troponin level of 0.05 called by lab at 1030. Dr. Ashby Dawes notified and no further orders at this time. Will continue to monitor

## 2016-01-11 NOTE — Progress Notes (Signed)
Pharmacy consult note  Antibiotic renal dosing: Patient is not currently on any antibiotics that require renal adjustment. Pharmacy will follow on.

## 2016-01-11 NOTE — Progress Notes (Signed)
Subjective:   Missed last four outpatient dialysis treatments. Admitted for nausea/vomiting and headache. Decompensated in the ED. Intubated and sedated.  Potassium of 7.5.  CXR with pulmonary edema  Objective:  Vital signs in last 24 hours:  Temp:  [98.6 F (37 C)-98.7 F (37.1 C)] 98.7 F (37.1 C) (06/03 0400) Pulse Rate:  [49-153] 57 (06/03 0600) Resp:  [8-36] 24 (06/03 0500) BP: (103-200)/(63-119) 181/85 mmHg (06/03 0600) SpO2:  [12 %-100 %] 100 % (06/03 0600) FiO2 (%):  [40 %-45 %] 40 % (06/03 0327) Weight:  [88.5 kg (195 lb 1.7 oz)] 88.5 kg (195 lb 1.7 oz) (06/03 0000)  Weight change:  Filed Weights   01/11/16 0000  Weight: 88.5 kg (195 lb 1.7 oz)    Intake/Output:    Intake/Output Summary (Last 24 hours) at 01/11/16 0728 Last data filed at 01/11/16 0600  Gross per 24 hour  Intake 165.58 ml  Output    475 ml  Net -309.42 ml     Physical Exam: General: NAD  HEENT ETT  Neck Trachea midline  Pulm/lungs PRVC FiO2 40%  CVS/Heart regular  Abdomen:  Soft, nontender  Extremities: +trace edema  Neurologic: Intubated, sedated  Skin: No acute rashes  Access: Left arm AVF       Basic Metabolic Panel:   Recent Labs Lab 01/08/16 1723 01/10/16 1308 01/11/16 0331  NA 141 140 135  K 4.5 5.0 7.5*  CL 102 103 102  CO2 26 21* 18*  GLUCOSE 85 100* 118*  BUN 58* 76* 86*  CREATININE 10.85* 14.54* 15.75*  CALCIUM 8.9 8.9 7.8*  MG  --   --  2.2  PHOS  --   --  8.6*     CBC:  Recent Labs Lab 01/08/16 1723 01/10/16 1308 01/11/16 0331  WBC 7.8 7.4 5.8  HGB 9.4* 9.1* 8.1*  HCT 28.1* 27.9* 24.3*  MCV 89.0 89.3 89.0  PLT 256 301 264      Microbiology:  Recent Results (from the past 720 hour(s))  MRSA PCR Screening     Status: None   Collection Time: 12/21/15  6:10 AM  Result Value Ref Range Status   MRSA by PCR NEGATIVE NEGATIVE Final    Comment:        The GeneXpert MRSA Assay (FDA approved for NASAL specimens only), is one component of  a comprehensive MRSA colonization surveillance program. It is not intended to diagnose MRSA infection nor to guide or monitor treatment for MRSA infections.   MRSA PCR Screening     Status: None   Collection Time: 01/10/16 11:13 PM  Result Value Ref Range Status   MRSA by PCR NEGATIVE NEGATIVE Final    Comment:        The GeneXpert MRSA Assay (FDA approved for NASAL specimens only), is one component of a comprehensive MRSA colonization surveillance program. It is not intended to diagnose MRSA infection nor to guide or monitor treatment for MRSA infections.     Coagulation Studies: No results for input(s): LABPROT, INR in the last 72 hours.  Urinalysis:  Recent Labs  01/08/16 1829 01/10/16 1308  COLORURINE STRAW* STRAW*  LABSPEC 1.007 1.008  PHURINE 9.0* 8.0  GLUCOSEU 150* 150*  HGBUR 1+* 1+*  BILIRUBINUR NEGATIVE NEGATIVE  KETONESUR NEGATIVE NEGATIVE  PROTEINUR >500* 100*  NITRITE NEGATIVE NEGATIVE  LEUKOCYTESUR NEGATIVE NEGATIVE      Imaging: Dg Abd 1 View  01/10/2016  CLINICAL DATA:  NG tube placement EXAM: ABDOMEN - 1 VIEW COMPARISON:  None. FINDINGS: NG tube tip is in the mid stomach. Nonobstructive bowel gas pattern. IMPRESSION: NG tube tip in the mid stomach. Electronically Signed   By: Rolm Baptise M.D.   On: 01/10/2016 20:52   Ct Head Wo Contrast  01/10/2016  CLINICAL DATA:  Sudden loss of consciousness.  Worsening dyspnea. EXAM: CT HEAD WITHOUT CONTRAST TECHNIQUE: Contiguous axial images were obtained from the base of the skull through the vertex without intravenous contrast. COMPARISON:  12/30/2015 FINDINGS: No evidence for acute intracranial abnormalities. No evidence for acute cortical infarct, intracranial hemorrhage or mass. Physiologic calcifications noted within the basal ganglia. Mild cerebral atrophy. The paranasal sinuses and M mastoid air cells are clear. The calvarium appears intact IMPRESSION: 1. No acute intracranial abnormalities. 2. Mild  cerebral atrophy. Electronically Signed   By: Kerby Moors M.D.   On: 01/10/2016 19:13   Ct Angio Chest Pe W/cm &/or Wo Cm  01/10/2016  CLINICAL DATA:  Sudden low loss of consciousness. Worsening shortness of breath. EXAM: CT ANGIOGRAPHY CHEST CT ABDOMEN AND PELVIS WITH CONTRAST TECHNIQUE: Multidetector CT imaging of the chest was performed using the standard protocol during bolus administration of intravenous contrast. Multiplanar CT image reconstructions and MIPs were obtained to evaluate the vascular anatomy. Multidetector CT imaging of the abdomen and pelvis was performed using the standard protocol during bolus administration of intravenous contrast. CONTRAST:  100 cc Isovue 370 COMPARISON:  None. FINDINGS: CTA CHEST FINDINGS Mediastinum/Nodes: Heart is enlarged. Aorta is normal caliber. Mildly prominent mediastinal and bilateral hilar lymph nodes. Index prevascular lymph node measures 14 mm in short axis diameter. No evidence of pulmonary embolus. Lungs/Pleura: Small bilateral pleural effusions. Diffuse ground-glass opacities and more confluent airspace opacities throughout the lungs, most compatible with edema. Dependent atelectasis or consolidation in the lower lobes dependently. Musculoskeletal: Chest wall soft tissues are unremarkable. No acute bony abnormality or focal bone lesion. CT ABDOMEN and PELVIS FINDINGS Hepatobiliary: Mild periportal edema. No focal hepatic abnormality. Gallbladder unremarkable. Pancreas: No focal abnormality or ductal dilatation. Spleen: No focal abnormality.  Normal size. Adrenals/Urinary Tract: Cortical thinning within the kidneys bilaterally. No masses. No hydronephrosis. Adrenal glands and urinary bladder unremarkable. Stomach/Bowel: Scattered right colonic diverticulosis. No active diverticulitis. Small bowel and stomach decompressed. Vascular/Lymphatic: Normal caliber.  No aneurysm.  No adenopathy. Reproductive: Uterus and adnexa unremarkable.  No mass. Other: No free  fluid or free air. Musculoskeletal: No acute bony abnormality or focal bone lesion. Review of the MIP images confirms the above findings. IMPRESSION: No evidence of pulmonary embolus. Cardiomegaly. Diffuse ground-glass airspace opacities throughout the lungs compatible with edema. Small bilateral pleural effusions. More consolidative airspace opacities posteriorly in the lower lobes bilaterally which could reflect dependent atelectasis or pneumonia. Mildly prominent mediastinal and bilateral hilar lymph nodes, likely reactive. Mild periportal edema. This may be related to intrinsic liver disease or passive congestion. Scattered right colonic diverticula.  No active diverticulitis. Electronically Signed   By: Rolm Baptise M.D.   On: 01/10/2016 19:13   Ct Abdomen Pelvis W Contrast  01/10/2016  CLINICAL DATA:  Sudden low loss of consciousness. Worsening shortness of breath. EXAM: CT ANGIOGRAPHY CHEST CT ABDOMEN AND PELVIS WITH CONTRAST TECHNIQUE: Multidetector CT imaging of the chest was performed using the standard protocol during bolus administration of intravenous contrast. Multiplanar CT image reconstructions and MIPs were obtained to evaluate the vascular anatomy. Multidetector CT imaging of the abdomen and pelvis was performed using the standard protocol during bolus administration of intravenous contrast. CONTRAST:  100 cc Isovue 370  COMPARISON:  None. FINDINGS: CTA CHEST FINDINGS Mediastinum/Nodes: Heart is enlarged. Aorta is normal caliber. Mildly prominent mediastinal and bilateral hilar lymph nodes. Index prevascular lymph node measures 14 mm in short axis diameter. No evidence of pulmonary embolus. Lungs/Pleura: Small bilateral pleural effusions. Diffuse ground-glass opacities and more confluent airspace opacities throughout the lungs, most compatible with edema. Dependent atelectasis or consolidation in the lower lobes dependently. Musculoskeletal: Chest wall soft tissues are unremarkable. No acute bony  abnormality or focal bone lesion. CT ABDOMEN and PELVIS FINDINGS Hepatobiliary: Mild periportal edema. No focal hepatic abnormality. Gallbladder unremarkable. Pancreas: No focal abnormality or ductal dilatation. Spleen: No focal abnormality.  Normal size. Adrenals/Urinary Tract: Cortical thinning within the kidneys bilaterally. No masses. No hydronephrosis. Adrenal glands and urinary bladder unremarkable. Stomach/Bowel: Scattered right colonic diverticulosis. No active diverticulitis. Small bowel and stomach decompressed. Vascular/Lymphatic: Normal caliber.  No aneurysm.  No adenopathy. Reproductive: Uterus and adnexa unremarkable.  No mass. Other: No free fluid or free air. Musculoskeletal: No acute bony abnormality or focal bone lesion. Review of the MIP images confirms the above findings. IMPRESSION: No evidence of pulmonary embolus. Cardiomegaly. Diffuse ground-glass airspace opacities throughout the lungs compatible with edema. Small bilateral pleural effusions. More consolidative airspace opacities posteriorly in the lower lobes bilaterally which could reflect dependent atelectasis or pneumonia. Mildly prominent mediastinal and bilateral hilar lymph nodes, likely reactive. Mild periportal edema. This may be related to intrinsic liver disease or passive congestion. Scattered right colonic diverticula.  No active diverticulitis. Electronically Signed   By: Rolm Baptise M.D.   On: 01/10/2016 19:13   Dg Chest Port 1 View  01/10/2016  CLINICAL DATA:  Intubation.  Shortness of breath EXAM: PORTABLE CHEST 1 VIEW COMPARISON:  12/21/2015 FINDINGS: Endotracheal tube is 13 mm above the carina. Cardiomegaly. Bilateral airspace opacities are noted, right greater than left, likely edema. No visible effusion. No acute bony abnormality. IMPRESSION: Cardiomegaly with bilateral airspace opacities, right greater than left, most compatible with edema/ CHF. Endotracheal tube 13 mm above the carina. Electronically Signed   By:  Rolm Baptise M.D.   On: 01/10/2016 17:33     Medications:   . fentaNYL infusion INTRAVENOUS 250 mcg/hr (01/11/16 0712)   . budesonide (PULMICORT) nebulizer solution  0.25 mg Nebulization Q12H  . famotidine (PEPCID) IV  20 mg Intravenous Q12H  . heparin  5,000 Units Subcutaneous Q8H  . insulin aspart  2-6 Units Subcutaneous Q4H  . ipratropium-albuterol  3 mL Nebulization Q6H   sodium chloride, acetaminophen, albuterol, bisacodyl, fentaNYL, hydrALAZINE, midazolam, midazolam, ondansetron (ZOFRAN) IV, sennosides  Assessment/ Plan:  55 y.o.black female with ESRD secondary to polycystic kidney hypertension, GERD, peritoneal dialysis h/o peritonitis, arthritis, chronic left hand pain, ruptured access   MWF 3rd shift, [facility].   1. End Stage Renal Disease: with hyperkalemia and metabolic acidosis: emergent hemodialysis for later this morning. Missed last four treatments. Also received IV contrast in the ED.  - Monitor daily for dialysis need.  [facility] dialysis. MWF-3  2. Acute Respiratory failure with Acute pulmonary edema: intubated on mechanical ventilation - UF with dialysis - Appreciate pulmonary input. Not currently on empiric antibiotics.   3. Anemia of chronic kidney disease: hemogobin 8.1 - EPO with dialysis treatment.   4. Secondary Hyperparathyroidism: outpatient calcium acetate for binding and on cinacalcet and ergocalciferol. Calcium low at 7.8 - hold cinacalcet    LOS:  Terrin Imparato 6/3/20177:28 AM

## 2016-01-11 NOTE — Progress Notes (Signed)
DIALYSIS COMPLETE.D.

## 2016-01-11 NOTE — Progress Notes (Signed)
*  PRELIMINARY RESULTS* Echocardiogram 2D Echocardiogram has been performed.  Jasmine Holloway 01/11/2016, 4:16 PM

## 2016-01-11 NOTE — Procedures (Signed)
Central Venous Catheter Insertion Procedure Note Brenli Rawdon [id number] 1961-08-03  Procedure: Insertion of Central Venous Catheter Indications: Assessment of intravascular volume, Drug and/or fluid administration and Frequent blood sampling  Procedure Details Consent: Unable to obtain consent because of emergent medical necessity. Time Out: Verified patient identification, verified procedure, site/side was marked, verified correct patient position, special equipment/implants available, medications/allergies/relevent history reviewed, required imaging and test results available.  Performed  Maximum sterile technique was used including antiseptics, cap, gloves, gown, hand hygiene, mask and sheet. Skin prep: Chlorhexidine; local anesthetic administered A antimicrobial bonded/coated triple lumen catheter was placed in the left internal jugular vein using the Seldinger technique.  Evaluation Blood flow good Complications: No apparent complications Patient did tolerate procedure well. Chest X-ray ordered to verify placement.  CXR: pending.  Procedure performed under direct supervision of Dr.Sincerity Cedar. Ultrasound utilized for realtime vessel cannulation Magdalene S. [facility] ANP-BC Pulmonary and [place] Pager [phone] or [phone]  01/11/2016, 8:33 AM  I was present for the duration of the procedure. Marda Stalker, M.D. 01/11/2016

## 2016-01-11 NOTE — Progress Notes (Signed)
Initial Nutrition Assessment   INTERVENTION:   Spoke with Dr. Ashby Dawes this afternoon and RN Judson Roch. Reviewed results of abdominal ultrasound. Dr. Ashby Dawes agreeable to start EN on patient via OG tube. Will initiate TF protocol with a goal rate of Vital High Protein at 92mL/hr with Prostat BID to provide 1160kcals, 114g protein. Will keep free water flushes minimal as pt requiring HD. Will follow electrolytes, UOP, diprivan rate, and GI tolerance and make recommendations accordingly.    NUTRITION DIAGNOSIS:   Inadequate oral intake related to acute illness as evidenced by NPO status.  GOAL:   Provide needs based on ASPEN/SCCM guidelines  MONITOR:   Vent status, Weight trends, Labs, I & O's, TF tolerance  REASON FOR ASSESSMENT:   Consult Assessment of nutrition requirement/status  ASSESSMENT:   Pt admitted with acute pulmonary edema secondary to volume overload from non-adherence to dialysis, acute hypoxic and hypercarbic respiratory failure, and abdominal pain of unknown etiology. Per Nephrology MD note pt missed last 3 oupatient HD sessions.  Abdominal pain possibly secondary to diabetic gastroparesis per MD note. Pt currently intubated.   Pt with emergent HD this am, 832mL UF documented. RD also notes urine output of 454mL documented yesterday  Patient is currently intubated on ventilator support MV: 10.2 L/min Temp (24hrs), Avg:98.2 F (36.8 C), Min:97.7 F (36.5 C), Max:98.7 F (37.1 C)  Propofol: 5.3 ml/hr (140kcals in 24 hours period)   Past Medical History  Diagnosis Date  . ESRD (end stage renal disease) on dialysis ([place])   . Hypertension   . Polycystic kidney disease   . Asthma   . COPD (chronic obstructive pulmonary disease) ([place])   . Diabetes mellitus without complication ([place])   . Neuropathy ([place])   . Renal insufficiency     Diet Order:  Diet NPO time specified   Pt remains NPO with OG tube in place.    Medications: SS novolog, fentanyl,  versed, diprivan  Electrolyte/Renal Profile and Glucose Profile:   Recent Labs Lab 01/10/16 1308 01/11/16 0331 01/11/16 0950  NA 140 135 139  K 5.0 7.5* 5.6*  CL 103 102 102  CO2 21* 18* 21*  BUN 76* 86* 72*  CREATININE 14.54* 15.75* 12.40*  CALCIUM 8.9 7.8* 8.4*  MG  --  2.2  --   PHOS  --  8.6*  --   GLUCOSE 100* 118* 122*    Gastrointestinal Profile: Last BM:  no BM documented   Nutrition-Focused Physical Exam Findings:  Unable to complete Nutrition-Focused physical exam at this time.  Moderate edema noted per Nsg chart.   Weight Change: Per [facility] weight trends stable this month, increased weight from a few months ago weight between 180-185lbs.   Skin:  Reviewed, no issues   Height:   Ht Readings from Last 1 Encounters:  01/10/16 5\' 3"  (1.6 m)    Weight:   Wt Readings from Last 1 Encounters:  01/11/16 195 lb 1.7 oz (88.5 kg)   Wt Readings from Last 10 Encounters:  01/11/16 195 lb 1.7 oz (88.5 kg)  01/08/16 193 lb (87.544 kg)  12/30/15 183 lb (83.008 kg)  12/21/15 195 lb 12.3 oz (88.8 kg)  10/12/15 185 lb (83.915 kg)  10/12/15 185 lb (83.915 kg)  10/05/15 188 lb (85.276 kg)  09/28/15 185 lb (83.915 kg)  09/10/15 180 lb (81.647 kg)  08/06/15 180 lb (81.647 kg)    Ideal Body Weight:   53kg  BMI:  Body mass index is 34.57 kg/(m^2).  Estimated Nutritional Needs:  Kcal:  979-1246kcals, (11-14kcals/kg) using ABW of 89kg  Protein:  106-132g protein (2-2.5g/kg) using IBW of 53kg  Fluid:  UOP+1L  EDUCATION NEEDS:   Education needs no appropriate at this time  Dwyane Luo, RD, LDN Pager [phone] Weekend/On-Call Pager [phone]

## 2016-01-11 NOTE — Progress Notes (Signed)
PULMONARY / CRITICAL CARE MEDICINE   Name: Jasmine Holloway MRN: [id number] DOB: 1961-07-27    ADMISSION DATE:  01/10/2016   DISCUSSION: 55 year old African-American female presenting with acute pulmonary edema secondary to volume overload from non-adherence to dialysis, acute hypoxic and hypercarbic respiratory failure, and abdominal pain of unknown etiology. Abdominal pain likely due to diabetic gastroparesis than to any infectious process.  ASSESSMENT / PLAN:  PULMONARY A: Acute hypoxic/hypercarbic respiratory failure. History of COPD/asthma Pulmonary edema P:   -Continue full vent support with current settings. -Stat ABG reviewed, vent settings made. -Repeat ABG reviewed, no change. Vent settings. -Nebulized steroids and bronchodilators -Daily chest x-ray. -Daily ABG -Lasix 80 mg IV 1 given -Left  IJ placed  CARDIOVASCULAR A:  Hypertension Volume overload secondary to noncompliance with dialysis P:  -Hemodynamics per ICU protocol -Hydralazine 10 mg IV every 4 hours when necessary for systolic blood pressure greater than 170 -Dialysis and diuresis per nephrology  RENAL A:   End-stage renal disease on dialysis Polycystic kidney disease Severe hypokalemia P:   -Stat nephrology consult for inpatient hemodialysis-spoke with on-call nephrologists. -D50 with 10 units of regular insulin 1. -Calcium gluconate 1 g IV 1. -Follow-up repeat BMP   GASTROINTESTINAL A:   Abdominal pain-unclear etiology;  rule out diabetic gastroparesis History of ventral hernia P:   -Abdominal ultrasound. -PPI stress ulcer prophylaxis -Reglan 5 mg every 6 hours when necessary for nausea and vomiting  HEMATOLOGIC A:   Chronic anemia P:  -Trend CBC. -Heparin for VTE prophylaxis  INFECTIOUS A:   No acute issues-pro-calcitonin mildly elevated P:   -Follow-up cultures -No antibiotics for now  CULTURES: Blood cultures 2. MRSA screen negative. Urine  culture  ANTIBIOTICS: None  ENDOCRINE A:   Type 2 diabetes mellitus P:   -Blood glucose monitoring with sliding scale insulin coverage  NEUROLOGIC A:   Altered mental status. Acute agitation and anxiety P:   RASS goal: -1 to -2  -Fentanyl and when necessary Versed for vent sedation   STUDIES:  Ultrasound abdomen 01/11/2016   SIGNIFICANT EVENTS: 01/10/2016 ED with abdominal pain, became agitated, intubated for airway protection  LINES/TUBES: Peripheral IVs Dialysis shunt  --------------------------------  SUBJECTIVE: Remains intubated and sedated. Occasionally gets agitated.   VITAL SIGNS: BP 181/85 mmHg  Pulse 57  Temp(Src) 97.7 F (36.5 C) (Axillary)  Resp 24  Ht 5\' 3"  (1.6 m)  Wt 195 lb 1.7 oz (88.5 kg)  BMI 34.57 kg/m2  SpO2 100%  HEMODYNAMICS:    VENTILATOR SETTINGS: Vent Mode:  [-] PRVC FiO2 (%):  [40 %-45 %] 40 % Set Rate:  [16 bmp-24 bmp] 24 bmp Vt Set:  [420 mL] 420 mL PEEP:  [5 cmH20-8 cmH20] 5 cmH20  INTAKE / OUTPUT: I/O last 3 completed shifts: In: 165.6 [I.V.:165.6] Out: 475 [Urine:475]  PHYSICAL EXAMINATION: General: Chronically ill-looking, no distress Neuro:  Awakens to noxious stimulus, moves all extremities to pain HEENT: Pupils pinpoint and sluggish. Conjunctivae pink. Trachea midline Cardiovascular:  Rate and rhythm regular, S1, S2, no murmur, regurg or gallop Lungs: Clear to auscultation bilaterally without wheezes or rhonchi Abdomen: No palpable organomegaly, normal bowel sounds Musculoskeletal:  Positive range of motion in upper and lower extremities Extremities: +2 pulses, trace edema Skin:  Warm and dry  LABS:  BMET  Recent Labs Lab 01/08/16 1723 01/10/16 1308 01/11/16 0331  NA 141 140 135  K 4.5 5.0 7.5*  CL 102 103 102  CO2 26 21* 18*  BUN 58* 76* 86*  CREATININE 10.85* 14.54* 15.75*  GLUCOSE 85 100* 118*    Electrolytes  Recent Labs Lab 01/08/16 1723 01/10/16 1308 01/11/16 0331  CALCIUM 8.9  8.9 7.8*  MG  --   --  2.2  PHOS  --   --  8.6*    CBC  Recent Labs Lab 01/08/16 1723 01/10/16 1308 01/11/16 0331  WBC 7.8 7.4 5.8  HGB 9.4* 9.1* 8.1*  HCT 28.1* 27.9* 24.3*  PLT 256 301 264    Coag's No results for input(s): APTT, INR in the last 168 hours.  Sepsis Markers  Recent Labs Lab 01/10/16 2107 01/10/16 2139  LATICACIDVEN  --  0.8  PROCALCITON 1.76  --     ABG  Recent Labs Lab 01/10/16 1743 01/10/16 2300 01/11/16 0400  PHART 7.20* 7.29* 7.33*  PCO2ART 60* 47 37  PO2ART 82* 98 97    Liver Enzymes  Recent Labs Lab 01/08/16 1723 01/10/16 1308  AST 17 24  ALT 14 20  ALKPHOS 51 58  BILITOT 0.5 0.7  ALBUMIN 3.7 3.6    Cardiac Enzymes  Recent Labs Lab 01/10/16 2107 01/11/16 0331  TROPONINI 0.08* 0.07*    Glucose  Recent Labs Lab 01/10/16 2306 01/11/16 0715  GLUCAP 98 122*    Imaging Dg Abd 1 View  01/10/2016  CLINICAL DATA:  NG tube placement EXAM: ABDOMEN - 1 VIEW COMPARISON:  None. FINDINGS: NG tube tip is in the mid stomach. Nonobstructive bowel gas pattern. IMPRESSION: NG tube tip in the mid stomach. Electronically Signed   By: Rolm Baptise M.D.   On: 01/10/2016 20:52   Ct Head Wo Contrast  01/10/2016  CLINICAL DATA:  Sudden loss of consciousness.  Worsening dyspnea. EXAM: CT HEAD WITHOUT CONTRAST TECHNIQUE: Contiguous axial images were obtained from the base of the skull through the vertex without intravenous contrast. COMPARISON:  12/30/2015 FINDINGS: No evidence for acute intracranial abnormalities. No evidence for acute cortical infarct, intracranial hemorrhage or mass. Physiologic calcifications noted within the basal ganglia. Mild cerebral atrophy. The paranasal sinuses and M mastoid air cells are clear. The calvarium appears intact IMPRESSION: 1. No acute intracranial abnormalities. 2. Mild cerebral atrophy. Electronically Signed   By: Kerby Moors M.D.   On: 01/10/2016 19:13   Ct Angio Chest Pe W/cm &/or Wo Cm  01/10/2016   CLINICAL DATA:  Sudden low loss of consciousness. Worsening shortness of breath. EXAM: CT ANGIOGRAPHY CHEST CT ABDOMEN AND PELVIS WITH CONTRAST TECHNIQUE: Multidetector CT imaging of the chest was performed using the standard protocol during bolus administration of intravenous contrast. Multiplanar CT image reconstructions and MIPs were obtained to evaluate the vascular anatomy. Multidetector CT imaging of the abdomen and pelvis was performed using the standard protocol during bolus administration of intravenous contrast. CONTRAST:  100 cc Isovue 370 COMPARISON:  None. FINDINGS: CTA CHEST FINDINGS Mediastinum/Nodes: Heart is enlarged. Aorta is normal caliber. Mildly prominent mediastinal and bilateral hilar lymph nodes. Index prevascular lymph node measures 14 mm in short axis diameter. No evidence of pulmonary embolus. Lungs/Pleura: Small bilateral pleural effusions. Diffuse ground-glass opacities and more confluent airspace opacities throughout the lungs, most compatible with edema. Dependent atelectasis or consolidation in the lower lobes dependently. Musculoskeletal: Chest wall soft tissues are unremarkable. No acute bony abnormality or focal bone lesion. CT ABDOMEN and PELVIS FINDINGS Hepatobiliary: Mild periportal edema. No focal hepatic abnormality. Gallbladder unremarkable. Pancreas: No focal abnormality or ductal dilatation. Spleen: No focal abnormality.  Normal size. Adrenals/Urinary Tract: Cortical thinning within the kidneys bilaterally. No masses. No hydronephrosis. Adrenal glands and  urinary bladder unremarkable. Stomach/Bowel: Scattered right colonic diverticulosis. No active diverticulitis. Small bowel and stomach decompressed. Vascular/Lymphatic: Normal caliber.  No aneurysm.  No adenopathy. Reproductive: Uterus and adnexa unremarkable.  No mass. Other: No free fluid or free air. Musculoskeletal: No acute bony abnormality or focal bone lesion. Review of the MIP images confirms the above findings.  IMPRESSION: No evidence of pulmonary embolus. Cardiomegaly. Diffuse ground-glass airspace opacities throughout the lungs compatible with edema. Small bilateral pleural effusions. More consolidative airspace opacities posteriorly in the lower lobes bilaterally which could reflect dependent atelectasis or pneumonia. Mildly prominent mediastinal and bilateral hilar lymph nodes, likely reactive. Mild periportal edema. This may be related to intrinsic liver disease or passive congestion. Scattered right colonic diverticula.  No active diverticulitis. Electronically Signed   By: Rolm Baptise M.D.   On: 01/10/2016 19:13   Ct Abdomen Pelvis W Contrast  01/10/2016  CLINICAL DATA:  Sudden low loss of consciousness. Worsening shortness of breath. EXAM: CT ANGIOGRAPHY CHEST CT ABDOMEN AND PELVIS WITH CONTRAST TECHNIQUE: Multidetector CT imaging of the chest was performed using the standard protocol during bolus administration of intravenous contrast. Multiplanar CT image reconstructions and MIPs were obtained to evaluate the vascular anatomy. Multidetector CT imaging of the abdomen and pelvis was performed using the standard protocol during bolus administration of intravenous contrast. CONTRAST:  100 cc Isovue 370 COMPARISON:  None. FINDINGS: CTA CHEST FINDINGS Mediastinum/Nodes: Heart is enlarged. Aorta is normal caliber. Mildly prominent mediastinal and bilateral hilar lymph nodes. Index prevascular lymph node measures 14 mm in short axis diameter. No evidence of pulmonary embolus. Lungs/Pleura: Small bilateral pleural effusions. Diffuse ground-glass opacities and more confluent airspace opacities throughout the lungs, most compatible with edema. Dependent atelectasis or consolidation in the lower lobes dependently. Musculoskeletal: Chest wall soft tissues are unremarkable. No acute bony abnormality or focal bone lesion. CT ABDOMEN and PELVIS FINDINGS Hepatobiliary: Mild periportal edema. No focal hepatic abnormality.  Gallbladder unremarkable. Pancreas: No focal abnormality or ductal dilatation. Spleen: No focal abnormality.  Normal size. Adrenals/Urinary Tract: Cortical thinning within the kidneys bilaterally. No masses. No hydronephrosis. Adrenal glands and urinary bladder unremarkable. Stomach/Bowel: Scattered right colonic diverticulosis. No active diverticulitis. Small bowel and stomach decompressed. Vascular/Lymphatic: Normal caliber.  No aneurysm.  No adenopathy. Reproductive: Uterus and adnexa unremarkable.  No mass. Other: No free fluid or free air. Musculoskeletal: No acute bony abnormality or focal bone lesion. Review of the MIP images confirms the above findings. IMPRESSION: No evidence of pulmonary embolus. Cardiomegaly. Diffuse ground-glass airspace opacities throughout the lungs compatible with edema. Small bilateral pleural effusions. More consolidative airspace opacities posteriorly in the lower lobes bilaterally which could reflect dependent atelectasis or pneumonia. Mildly prominent mediastinal and bilateral hilar lymph nodes, likely reactive. Mild periportal edema. This may be related to intrinsic liver disease or passive congestion. Scattered right colonic diverticula.  No active diverticulitis. Electronically Signed   By: Rolm Baptise M.D.   On: 01/10/2016 19:13   Dg Chest Port 1 View  01/10/2016  CLINICAL DATA:  Intubation.  Shortness of breath EXAM: PORTABLE CHEST 1 VIEW COMPARISON:  12/21/2015 FINDINGS: Endotracheal tube is 13 mm above the carina. Cardiomegaly. Bilateral airspace opacities are noted, right greater than left, likely edema. No visible effusion. No acute bony abnormality. IMPRESSION: Cardiomegaly with bilateral airspace opacities, right greater than left, most compatible with edema/ CHF. Endotracheal tube 13 mm above the carina. Electronically Signed   By: Rolm Baptise M.D.   On: 01/10/2016 17:33       Magdalene  Abran Duke ANP-BC Pulmonary and [place] Pager [phone] or [phone]    01/11/2016, 8:56 AM  Patient seen and examined with the NP, I agree with the assessment and plan. Currently the patient is stable on current ventilator settings, lungs are clear to auscultation bilaterally, she is minimally responsive today. Continue current ventilator support. Continue dialysis per nephro. Marda Stalker M.D. 01/11/2016  Critical Care Attestation.  I have personally obtained a history, examined the patient, evaluated laboratory and imaging results, formulated the assessment and plan and placed orders. The Patient requires high complexity decision making for assessment and support, frequent evaluation and titration of therapies, application of advanced monitoring technologies and extensive interpretation of multiple databases. The patient has critical illness that could lead imminently to failure of 1 or more organ systems and requires the highest level of physician preparedness to intervene.  Critical Care Time devoted to patient care services described in this note is 35 minutes and is exclusive of time spent in procedures.

## 2016-01-12 ENCOUNTER — Inpatient Hospital Stay: Payer: Medicare Other

## 2016-01-12 LAB — CBC
HCT: 22 % — ABNORMAL LOW (ref 35.0–47.0)
HEMOGLOBIN: 7.3 g/dL — AB (ref 12.0–16.0)
MCH: 29.3 pg (ref 26.0–34.0)
MCHC: 33.2 g/dL (ref 32.0–36.0)
MCV: 88.4 fL (ref 80.0–100.0)
Platelets: 242 10*3/uL (ref 150–440)
RBC: 2.49 MIL/uL — ABNORMAL LOW (ref 3.80–5.20)
RDW: 18.5 % — AB (ref 11.5–14.5)
WBC: 7.5 10*3/uL (ref 3.6–11.0)

## 2016-01-12 LAB — BASIC METABOLIC PANEL
ANION GAP: 10 (ref 5–15)
BUN: 41 mg/dL — ABNORMAL HIGH (ref 6–20)
CALCIUM: 8 mg/dL — AB (ref 8.9–10.3)
CO2: 29 mmol/L (ref 22–32)
Chloride: 99 mmol/L — ABNORMAL LOW (ref 101–111)
Creatinine, Ser: 8.63 mg/dL — ABNORMAL HIGH (ref 0.44–1.00)
GFR, EST AFRICAN AMERICAN: 5 mL/min — AB (ref >60)
GFR, EST NON AFRICAN AMERICAN: 5 mL/min — AB (ref >60)
GLUCOSE: 119 mg/dL — AB (ref 65–99)
Potassium: 4.2 mmol/L (ref 3.5–5.1)
SODIUM: 138 mmol/L (ref 135–145)

## 2016-01-12 LAB — BLOOD GAS, ARTERIAL
Acid-Base Excess: 10.8 mmol/L — ABNORMAL HIGH (ref 0.0–3.0)
Bicarbonate: 35.1 mEq/L — ABNORMAL HIGH (ref 21.0–28.0)
FIO2: 0.28
MECHANICAL RATE: 24
O2 SAT: 94.7 %
PCO2 ART: 45 mmHg (ref 32.0–48.0)
PEEP: 5 cmH2O
Patient temperature: 37
VT: 420 mL
pH, Arterial: 7.5 — ABNORMAL HIGH (ref 7.350–7.450)
pO2, Arterial: 67 mmHg — ABNORMAL LOW (ref 83.0–108.0)

## 2016-01-12 LAB — URINE CULTURE

## 2016-01-12 LAB — GLUCOSE, CAPILLARY
GLUCOSE-CAPILLARY: 111 mg/dL — AB (ref 65–99)
GLUCOSE-CAPILLARY: 109 mg/dL — AB (ref 65–99)
GLUCOSE-CAPILLARY: 96 mg/dL (ref 65–99)
GLUCOSE-CAPILLARY: 96 mg/dL (ref 65–99)

## 2016-01-12 LAB — ECHOCARDIOGRAM COMPLETE
HEIGHTINCHES: 63 in
WEIGHTICAEL: 3121.71 [oz_av]

## 2016-01-12 LAB — ABO/RH: ABO/RH(D): O POS

## 2016-01-12 LAB — HEPATITIS B SURFACE ANTIBODY,QUALITATIVE: Hep B S Ab: REACTIVE

## 2016-01-12 LAB — HEPATITIS B SURFACE ANTIGEN: Hepatitis B Surface Ag: NEGATIVE

## 2016-01-12 LAB — HEMOGLOBIN: HEMOGLOBIN: 7.2 g/dL — AB (ref 12.0–16.0)

## 2016-01-12 LAB — PHOSPHORUS: PHOSPHORUS: 6.9 mg/dL — AB (ref 2.5–4.6)

## 2016-01-12 LAB — PREPARE RBC (CROSSMATCH)

## 2016-01-12 LAB — HEPATITIS B CORE ANTIBODY, TOTAL: HEP B C TOTAL AB: POSITIVE — AB

## 2016-01-12 LAB — MAGNESIUM: Magnesium: 2 mg/dL (ref 1.7–2.4)

## 2016-01-12 MED ORDER — DEXMEDETOMIDINE HCL IN NACL 200 MCG/50ML IV SOLN: 0.4000 ug/kg/h | INTRAVENOUS | Status: DC

## 2016-01-12 MED ORDER — SODIUM CHLORIDE 0.9 % IV SOLN: Freq: Once | INTRAVENOUS | Status: DC

## 2016-01-12 MED ORDER — EPOETIN ALFA 10000 UNIT/ML IJ SOLN
10000.0000 [IU] | INTRAMUSCULAR | Status: DC
  Administered 2016-01-13 – 2016-01-17 (×3): 10000 [IU] via INTRAVENOUS

## 2016-01-12 MED ORDER — CETYLPYRIDINIUM CHLORIDE 0.05 % MT LIQD
7.0000 mL | Freq: Two times a day (BID) | OROMUCOSAL | Status: DC
  Administered 2016-01-13 – 2016-01-14 (×4): 7 mL via OROMUCOSAL

## 2016-01-12 MED ORDER — LORAZEPAM 2 MG/ML IJ SOLN
INTRAMUSCULAR | Status: AC
  Administered 2016-01-12: 0.5 mg
  Filled 2016-01-12: qty 1

## 2016-01-12 MED ORDER — DEXMEDETOMIDINE HCL IN NACL 400 MCG/100ML IV SOLN
0.4000 ug/kg/h | INTRAVENOUS | Status: DC
  Administered 2016-01-12: 0.9 ug/kg/h via INTRAVENOUS
  Administered 2016-01-12: 1 ug/kg/h via INTRAVENOUS
  Administered 2016-01-13 (×5): 1.2 ug/kg/h via INTRAVENOUS
  Administered 2016-01-13: 1 ug/kg/h via INTRAVENOUS
  Administered 2016-01-14: 1.2 ug/kg/h via INTRAVENOUS
  Administered 2016-01-14: 1.1 ug/kg/h via INTRAVENOUS
  Administered 2016-01-14: 0.4 ug/kg/h via INTRAVENOUS
  Administered 2016-01-14: 0.9 ug/kg/h via INTRAVENOUS
  Administered 2016-01-14: 0.5 ug/kg/h via INTRAVENOUS
  Administered 2016-01-14: 1.2 ug/kg/h via INTRAVENOUS
  Administered 2016-01-14: 0.7 ug/kg/h via INTRAVENOUS
  Administered 2016-01-14: 1.2 ug/kg/h via INTRAVENOUS
  Filled 2016-01-12 (×13): qty 100

## 2016-01-12 MED ORDER — LORAZEPAM 2 MG/ML IJ SOLN
0.5000 mg | INTRAMUSCULAR | Status: DC | PRN
  Administered 2016-01-12 – 2016-01-13 (×3): 0.5 mg via INTRAVENOUS
  Filled 2016-01-12 (×4): qty 1

## 2016-01-12 MED ORDER — CHLORHEXIDINE GLUCONATE 0.12 % MT SOLN
15.0000 mL | Freq: Two times a day (BID) | OROMUCOSAL | Status: DC
  Administered 2016-01-13 – 2016-01-14 (×3): 15 mL via OROMUCOSAL
  Filled 2016-01-12 (×2): qty 15

## 2016-01-12 MED ORDER — IPRATROPIUM-ALBUTEROL 0.5-2.5 (3) MG/3ML IN SOLN
3.0000 mL | Freq: Four times a day (QID) | RESPIRATORY_TRACT | Status: DC | PRN
  Administered 2016-01-12: 3 mL via RESPIRATORY_TRACT
  Filled 2016-01-12: qty 3

## 2016-01-12 NOTE — Progress Notes (Signed)
STARTED HEMODIALYSIS

## 2016-01-12 NOTE — Progress Notes (Signed)
UPDATE:   Pt evaluated post-extubation. Appears fidgety, keeps saying "I can't breathe" resp rate rate=18, CTA bilaterally, sat=95% on RA.  Mild hoarseness suggestive of laryngitis/tracheitis, but no evidence of stridor.   -Will try budesonide/duonebs to help with sensation of dyspnea.   -If becomes increasingly anxious can try precedex.   Ashby Dawes, M.D.  01/12/2016

## 2016-01-12 NOTE — Progress Notes (Signed)
PRE DIALYSIS ASSESSMENT 

## 2016-01-12 NOTE — Progress Notes (Signed)
DIALYSIS COMPLETE.D.

## 2016-01-12 NOTE — Progress Notes (Signed)
Subjective:   Emergent hemodialysis yesterday. Did not tolerate treatment well. Uf of 800.  K improved to 4.2    Objective:  Vital signs in last 24 hours:  Temp:  [97.6 F (36.4 C)-98.3 F (36.8 C)] 97.6 F (36.4 C) (06/04 0400) Pulse Rate:  [61-81] 61 (06/04 0800) Resp:  [16-26] 16 (06/04 0800) BP: (72-183)/(39-169) 102/61 mmHg (06/04 0800) SpO2:  [95 %-100 %] 100 % (06/04 0804) FiO2 (%):  [28 %-40 %] 28 % (06/04 0804) Weight:  [83.5 kg (184 lb 1.4 oz)-88.5 kg (195 lb 1.7 oz)] 83.5 kg (184 lb 1.4 oz) (06/04 0423)  Weight change: 0 kg (0 lb) Filed Weights   01/11/16 0000 01/11/16 0930 01/12/16 0423  Weight: 88.5 kg (195 lb 1.7 oz) 88.5 kg (195 lb 1.7 oz) 83.5 kg (184 lb 1.4 oz)    Intake/Output:    Intake/Output Summary (Last 24 hours) at 01/12/16 0913 Last data filed at 01/12/16 0700  Gross per 24 hour  Intake 1437.05 ml  Output    835 ml  Net 602.05 ml     Physical Exam: General: Critically ill  HEENT +ETT, +NGT  Neck Trachea midline  Pulm/lungs Bilateral crackles, PS FiO2 28%  CVS/Heart regular  Abdomen:  Soft, nontender  Extremities: +trace edema  Neurologic: Intubated, sedated  Skin: No acute rashes  Access: Left arm AVF       Basic Metabolic Panel:   Recent Labs Lab 01/08/16 1723 01/10/16 1308 01/11/16 0331 01/11/16 0950 01/12/16 0542  NA 141 140 135 139 138  K 4.5 5.0 7.5* 5.6* 4.2  CL 102 103 102 102 99*  CO2 26 21* 18* 21* 29  GLUCOSE 85 100* 118* 122* 119*  BUN 58* 76* 86* 72* 41*  CREATININE 10.85* 14.54* 15.75* 12.40* 8.63*  CALCIUM 8.9 8.9 7.8* 8.4* 8.0*  MG  --   --  2.2  --  2.0  PHOS  --   --  8.6*  --  6.9*     CBC:  Recent Labs Lab 01/08/16 1723 01/10/16 1308 01/11/16 0331 01/11/16 0950 01/12/16 0542 01/12/16 0815  WBC 7.8 7.4 5.8 4.6 7.5  --   HGB 9.4* 9.1* 8.1* 8.9* 7.3* 7.2*  HCT 28.1* 27.9* 24.3* 26.9* 22.0*  --   MCV 89.0 89.3 89.0 88.0 88.4  --   PLT 256 301 264 302 242  --        Microbiology:  Recent Results (from the past 720 hour(s))  MRSA PCR Screening     Status: None   Collection Time: 12/21/15  6:10 AM  Result Value Ref Range Status   MRSA by PCR NEGATIVE NEGATIVE Final    Comment:        The GeneXpert MRSA Assay (FDA approved for NASAL specimens only), is one component of a comprehensive MRSA colonization surveillance program. It is not intended to diagnose MRSA infection nor to guide or monitor treatment for MRSA infections.   Urine culture     Status: Abnormal   Collection Time: 01/10/16  1:08 PM  Result Value Ref Range Status   Specimen Description URINE, CLEAN CATCH  Final   Special Requests NONE  Final   Culture MULTIPLE SPECIES PRESENT, SUGGEST RECOLLECTION (A)  Final   Report Status 01/12/2016 FINAL  Final  Culture, blood (routine x 2)     Status: None (Preliminary result)   Collection Time: 01/10/16  9:39 PM  Result Value Ref Range Status   Specimen Description BLOOD LEFT ASSIST CONTROL  Final  Special Requests   Final    BOTTLES DRAWN AEROBIC AND ANAEROBIC [id number]   Culture NO GROWTH < 24 HOURS  Final   Report Status PENDING  Incomplete  Culture, blood (routine x 2)     Status: None (Preliminary result)   Collection Time: 01/10/16 11:13 PM  Result Value Ref Range Status   Specimen Description BLOOD LEFT HAND  Final   Special Requests BOTTLES DRAWN AEROBIC AND ANAEROBIC [place]  Final   Culture NO GROWTH < 24 HOURS  Final   Report Status PENDING  Incomplete  MRSA PCR Screening     Status: None   Collection Time: 01/10/16 11:13 PM  Result Value Ref Range Status   MRSA by PCR NEGATIVE NEGATIVE Final    Comment:        The GeneXpert MRSA Assay (FDA approved for NASAL specimens only), is one component of a comprehensive MRSA colonization surveillance program. It is not intended to diagnose MRSA infection nor to guide or monitor treatment for MRSA infections.     Coagulation Studies: No results  for input(s): LABPROT, INR in the last 72 hours.  Urinalysis:  Recent Labs  01/10/16 1308  COLORURINE STRAW*  LABSPEC 1.008  PHURINE 8.0  GLUCOSEU 150*  HGBUR 1+*  BILIRUBINUR NEGATIVE  KETONESUR NEGATIVE  PROTEINUR 100*  NITRITE NEGATIVE  LEUKOCYTESUR NEGATIVE      Imaging: Dg Chest 1 View  01/12/2016  CLINICAL DATA:  Dyspnea EXAM: CHEST 1 VIEW COMPARISON:  01/11/2016 FINDINGS: ET tube tip is above the carina. There is a left IJ catheter with tip in the projection of the SVC. Stable cardiac enlargement. Left pleural effusion and moderate interstitial edema is identified compatible with CHF. IMPRESSION: 1. Stable support apparatus. 2. Similar appearance of CHF pattern. Electronically Signed   By: Kerby Moors M.D.   On: 01/12/2016 07:23   Dg Abd 1 View  01/10/2016  CLINICAL DATA:  NG tube placement EXAM: ABDOMEN - 1 VIEW COMPARISON:  None. FINDINGS: NG tube tip is in the mid stomach. Nonobstructive bowel gas pattern. IMPRESSION: NG tube tip in the mid stomach. Electronically Signed   By: Rolm Baptise M.D.   On: 01/10/2016 20:52   Ct Head Wo Contrast  01/10/2016  CLINICAL DATA:  Sudden loss of consciousness.  Worsening dyspnea. EXAM: CT HEAD WITHOUT CONTRAST TECHNIQUE: Contiguous axial images were obtained from the base of the skull through the vertex without intravenous contrast. COMPARISON:  12/30/2015 FINDINGS: No evidence for acute intracranial abnormalities. No evidence for acute cortical infarct, intracranial hemorrhage or mass. Physiologic calcifications noted within the basal ganglia. Mild cerebral atrophy. The paranasal sinuses and M mastoid air cells are clear. The calvarium appears intact IMPRESSION: 1. No acute intracranial abnormalities. 2. Mild cerebral atrophy. Electronically Signed   By: Kerby Moors M.D.   On: 01/10/2016 19:13   Ct Angio Chest Pe W/cm &/or Wo Cm  01/10/2016  CLINICAL DATA:  Sudden low loss of consciousness. Worsening shortness of breath. EXAM: CT  ANGIOGRAPHY CHEST CT ABDOMEN AND PELVIS WITH CONTRAST TECHNIQUE: Multidetector CT imaging of the chest was performed using the standard protocol during bolus administration of intravenous contrast. Multiplanar CT image reconstructions and MIPs were obtained to evaluate the vascular anatomy. Multidetector CT imaging of the abdomen and pelvis was performed using the standard protocol during bolus administration of intravenous contrast. CONTRAST:  100 cc Isovue 370 COMPARISON:  None. FINDINGS: CTA CHEST FINDINGS Mediastinum/Nodes: Heart is enlarged. Aorta is normal caliber. Mildly prominent mediastinal and bilateral hilar  lymph nodes. Index prevascular lymph node measures 14 mm in short axis diameter. No evidence of pulmonary embolus. Lungs/Pleura: Small bilateral pleural effusions. Diffuse ground-glass opacities and more confluent airspace opacities throughout the lungs, most compatible with edema. Dependent atelectasis or consolidation in the lower lobes dependently. Musculoskeletal: Chest wall soft tissues are unremarkable. No acute bony abnormality or focal bone lesion. CT ABDOMEN and PELVIS FINDINGS Hepatobiliary: Mild periportal edema. No focal hepatic abnormality. Gallbladder unremarkable. Pancreas: No focal abnormality or ductal dilatation. Spleen: No focal abnormality.  Normal size. Adrenals/Urinary Tract: Cortical thinning within the kidneys bilaterally. No masses. No hydronephrosis. Adrenal glands and urinary bladder unremarkable. Stomach/Bowel: Scattered right colonic diverticulosis. No active diverticulitis. Small bowel and stomach decompressed. Vascular/Lymphatic: Normal caliber.  No aneurysm.  No adenopathy. Reproductive: Uterus and adnexa unremarkable.  No mass. Other: No free fluid or free air. Musculoskeletal: No acute bony abnormality or focal bone lesion. Review of the MIP images confirms the above findings. IMPRESSION: No evidence of pulmonary embolus. Cardiomegaly. Diffuse ground-glass airspace  opacities throughout the lungs compatible with edema. Small bilateral pleural effusions. More consolidative airspace opacities posteriorly in the lower lobes bilaterally which could reflect dependent atelectasis or pneumonia. Mildly prominent mediastinal and bilateral hilar lymph nodes, likely reactive. Mild periportal edema. This may be related to intrinsic liver disease or passive congestion. Scattered right colonic diverticula.  No active diverticulitis. Electronically Signed   By: Rolm Baptise M.D.   On: 01/10/2016 19:13   US Abdomen Complete  01/11/2016  CLINICAL DATA:  Abdominal pain for 1 week. EXAM: ABDOMEN ULTRASOUND COMPLETE COMPARISON:  None. FINDINGS: Gallbladder: No gallstones or wall thickening visualized. No sonographic Murphy sign noted by sonographer. Common bile duct: Diameter: 8.1 mm. Liver: The liver has a coarsened echotexture. No focal liver abnormalities. Mild intrahepatic bile duct dilatation. IVC: No abnormality visualized. Pancreas: Not visualized due to overlying bowel gas Spleen: Size and appearance within normal limits. Right Kidney: Length: 7 cm. Diffuse cortical thinning. Increased parenchymal echogenicity. No hydronephrosis or mass. Left Kidney: Length: 7 cm. Increased parenchymal echogenicity. Diffuse cortical thinning. No mass or hydronephrosis. Abdominal aorta: No aneurysm visualized. Other findings: None. IMPRESSION: 1. Increase caliber of the common bile duct with mild intrahepatic bile duct dilatation. If there is a clinical concern for choledocholithiasis then MRCP may be helpful for further assessment. 2. Bilateral echogenic kidneys with diffuse cortical thinning compatible with chronic medical did renal disease. 3. Coarsened liver echotexture. Electronically Signed   By: Kerby Moors M.D.   On: 01/11/2016 10:34   Ct Abdomen Pelvis W Contrast  01/10/2016  CLINICAL DATA:  Sudden low loss of consciousness. Worsening shortness of breath. EXAM: CT ANGIOGRAPHY CHEST CT  ABDOMEN AND PELVIS WITH CONTRAST TECHNIQUE: Multidetector CT imaging of the chest was performed using the standard protocol during bolus administration of intravenous contrast. Multiplanar CT image reconstructions and MIPs were obtained to evaluate the vascular anatomy. Multidetector CT imaging of the abdomen and pelvis was performed using the standard protocol during bolus administration of intravenous contrast. CONTRAST:  100 cc Isovue 370 COMPARISON:  None. FINDINGS: CTA CHEST FINDINGS Mediastinum/Nodes: Heart is enlarged. Aorta is normal caliber. Mildly prominent mediastinal and bilateral hilar lymph nodes. Index prevascular lymph node measures 14 mm in short axis diameter. No evidence of pulmonary embolus. Lungs/Pleura: Small bilateral pleural effusions. Diffuse ground-glass opacities and more confluent airspace opacities throughout the lungs, most compatible with edema. Dependent atelectasis or consolidation in the lower lobes dependently. Musculoskeletal: Chest wall soft tissues are unremarkable. No acute bony abnormality  or focal bone lesion. CT ABDOMEN and PELVIS FINDINGS Hepatobiliary: Mild periportal edema. No focal hepatic abnormality. Gallbladder unremarkable. Pancreas: No focal abnormality or ductal dilatation. Spleen: No focal abnormality.  Normal size. Adrenals/Urinary Tract: Cortical thinning within the kidneys bilaterally. No masses. No hydronephrosis. Adrenal glands and urinary bladder unremarkable. Stomach/Bowel: Scattered right colonic diverticulosis. No active diverticulitis. Small bowel and stomach decompressed. Vascular/Lymphatic: Normal caliber.  No aneurysm.  No adenopathy. Reproductive: Uterus and adnexa unremarkable.  No mass. Other: No free fluid or free air. Musculoskeletal: No acute bony abnormality or focal bone lesion. Review of the MIP images confirms the above findings. IMPRESSION: No evidence of pulmonary embolus. Cardiomegaly. Diffuse ground-glass airspace opacities throughout  the lungs compatible with edema. Small bilateral pleural effusions. More consolidative airspace opacities posteriorly in the lower lobes bilaterally which could reflect dependent atelectasis or pneumonia. Mildly prominent mediastinal and bilateral hilar lymph nodes, likely reactive. Mild periportal edema. This may be related to intrinsic liver disease or passive congestion. Scattered right colonic diverticula.  No active diverticulitis. Electronically Signed   By: Rolm Baptise M.D.   On: 01/10/2016 19:13   Dg Chest Port 1 View  01/11/2016  CLINICAL DATA:  Acute respiratory failure EXAM: PORTABLE CHEST 1 VIEW COMPARISON:  01/10/2016 FINDINGS: Interval placement of NG tube into the stomach. Endotracheal tube is unchanged. Cardiomegaly. Bilateral airspace opacities, improved since prior study, likely improving edema. No visible significant effusions. IMPRESSION: Improving pulmonary edema pattern.  Mild CHF persists. Electronically Signed   By: Rolm Baptise M.D.   On: 01/11/2016 09:41   Dg Chest Port 1 View  01/11/2016  CLINICAL DATA:  Evaluate central line placement EXAM: PORTABLE CHEST 1 VIEW COMPARISON:  01/11/2016 FINDINGS: The endotracheal tube tip is above the carina. The left IJ catheter tip is in the projection of the SVC. There is an nasogastric tube with tip in the stomach. Heart size appears normal. There is moderate interstitial edema which appears similar to the previous exam. IMPRESSION: 1. CHF, similar to previous exam. 2. Stable support apparatus. 3. The left IJ catheter tip is in the projection of the SVC. No pneumothorax identified. Electronically Signed   By: Kerby Moors M.D.   On: 01/11/2016 09:09   Dg Chest Port 1 View  01/10/2016  CLINICAL DATA:  Intubation.  Shortness of breath EXAM: PORTABLE CHEST 1 VIEW COMPARISON:  12/21/2015 FINDINGS: Endotracheal tube is 13 mm above the carina. Cardiomegaly. Bilateral airspace opacities are noted, right greater than left, likely edema. No visible  effusion. No acute bony abnormality. IMPRESSION: Cardiomegaly with bilateral airspace opacities, right greater than left, most compatible with edema/ CHF. Endotracheal tube 13 mm above the carina. Electronically Signed   By: Rolm Baptise M.D.   On: 01/10/2016 17:33     Medications:   . dexmedetomidine 0.9 mcg/kg/hr (01/12/16 0830)  . fentaNYL infusion INTRAVENOUS 300 mcg/hr (01/12/16 0700)  . propofol (DIPRIVAN) infusion 32 mcg/kg/min (01/12/16 0700)   . sodium chloride   Intravenous Once  . antiseptic oral rinse  7 mL Mouth Rinse Q2H  . budesonide (PULMICORT) nebulizer solution  0.25 mg Nebulization Q12H  . chlorhexidine gluconate (SAGE KIT)  15 mL Mouth Rinse BID  . famotidine (PEPCID) IV  20 mg Intravenous Q24H  . feeding supplement (PRO-STAT SUGAR FREE 64)  30 mL Per Tube BID  . feeding supplement (VITAL HIGH PROTEIN)  1,000 mL Per Tube Q24H  . 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   1. End Stage Renal Disease: with hyperkalemia and metabolic acidosis: emergent hemodialysis yesterday for hyperkalemia and pulmonary edema. Also received IV contrast in ED. Potassium improved but continues to have pulmonary edema requiring mechanical ventilation.  - Hemodialysis for later today. Monitor daily for dialysis need  2. Acute Respiratory failure with Acute pulmonary edema: intubated on mechanical ventilation - UF with dialysis - Appreciate pulmonary input.  3. Anemia of chronic kidney disease: hemogobin 7.3 - scheduled for PRBC transfusion today - EPO with MWF schedule  4. Secondary Hyperparathyroidism:  hyperphosphatemia as outpatient with phos of 6 on 12/16/15 - hold cinacalcet, holding binders.    LOS:  Lorean Ekstrand, Lurena Nida 6/4/20179:13 AM

## 2016-01-12 NOTE — Progress Notes (Signed)
Pt. Extubated to room air/,sat 93%.

## 2016-01-12 NOTE — Progress Notes (Signed)
This note also relates to the following rows which could not be included: Pulse Rate - Cannot attach notes to unvalidated device data Resp - Cannot attach notes to unvalidated device data SpO2 - Cannot attach notes to unvalidated device data End Tidal CO2 (EtCO2) - Cannot attach notes to unvalidated device data   PRE DIALYSIS ASSESSMENT

## 2016-01-12 NOTE — Progress Notes (Signed)
PULMONARY / CRITICAL CARE MEDICINE   Name: Jasmine Holloway MRN: [id number] DOB: 1960/12/11    ADMISSION DATE:  01/10/2016   DISCUSSION: 55 year old African-American female presenting with acute pulmonary edema secondary to volume overload from non-adherence to dialysis, acute hypoxic and hypercarbic respiratory failure, and abdominal pain of unknown etiology. Abdominal pain likely due to diabetic gastroparesis than to any infectious process.   ASSESSMENT / PLAN:  PULMONARY A: Acute hypoxic/hypercarbic respiratory failure. History of COPD/asthma Pulmonary edema P:   -Continue full vent support with current settings. -Stat ABG reviewed, vent settings made. -Repeat ABG reviewed, no change. Vent settings. -Nebulized steroids and bronchodilators -Daily chest x-ray. -Daily ABG -Lasix 80 mg IV 1 given -Left  IJ placed  CARDIOVASCULAR A:  Hypertension Volume overload secondary to noncompliance with dialysis P:  -Hemodynamics per ICU protocol -Hydralazine 10 mg IV every 4 hours when necessary for systolic blood pressure greater than 170 -Dialysis and diuresis per nephrology  RENAL A:   End-stage renal disease on dialysis Polycystic kidney disease Severe hypokalemia-potassium down to 4.2 post dialysis P:   -Hemodialysis per nephrology -Monitor and correct electrolyte imbalances   GASTROINTESTINAL A:   Abdominal pain-unclear etiology;  rule out diabetic gastroparesis-abdominal ultrasound does not show any acute changes except for questionable cholelithiasis History of ventral hernia P:   -Monitor abdominal pain post extubation -PPI stress ulcer prophylaxis -Reglan 5 mg every 6 hours when necessary for nausea and vomiting  HEMATOLOGIC A:   Chronic anemia-hemoglobin trending down P:  -Trend CBC. -Transfuse 1 unit of packed red blood cells -Heparin for VTE prophylaxis  INFECTIOUS A:   No acute issues-pro-calcitonin mildly elevated P:   -Follow-up cultures -No  antibiotics for now  CULTURES: Blood cultures 2. MRSA screen negative. Urine culture  ANTIBIOTICS: None  ENDOCRINE A:   Type 2 diabetes mellitus P:   -Blood glucose monitoring with sliding scale insulin coverage  NEUROLOGIC A:   Altered mental status-improved Acute agitation and anxiety-improved P:   RASS goal: -1 to -2  -Fentanyl, propofol infusions and when necessary Versed for vent sedation   STUDIES:  Ultrasound abdomen 01/11/2016-done see report   SIGNIFICANT EVENTS: 01/10/2016 ED with abdominal pain, became agitated, intubated for airway protection  LINES/TUBES: Peripheral IVs Dialysis shunt  --------------------------------  SUBJECTIVE: Remains intubated and sedated. Occasionally gets agitated.   VITAL SIGNS: BP 121/55 mmHg  Pulse 65  Temp(Src) 97.6 F (36.4 C) (Axillary)  Resp 24  Ht 5\' 3"  (1.6 m)  Wt 184 lb 1.4 oz (83.5 kg)  BMI 32.62 kg/m2  SpO2 100%  HEMODYNAMICS:    VENTILATOR SETTINGS: Vent Mode:  [-] PRVC FiO2 (%):  [28 %-40 %] 28 % Set Rate:  [24 bmp] 24 bmp Vt Set:  [420 mL] 420 mL PEEP:  [5 cmH20] 5 cmH20 Plateau Pressure:  [24 cmH20] 24 cmH20  INTAKE / OUTPUT: I/O last 3 completed shifts: In: 1179.6 [I.V.:1170.3; NG/GT:9.3] Out: 1460 [Urine:660; Other:800]  PHYSICAL EXAMINATION: General: Chronically ill-looking, no distress Neuro:  Awakens to noxious stimulus, moves all extremities to pain HEENT: Pupils pinpoint and sluggish. Conjunctivae pink. Trachea midline Cardiovascular:  Rate and rhythm regular, S1, S2, no murmur, regurg or gallop Lungs: Clear to auscultation bilaterally without wheezes or rhonchi Abdomen: No palpable organomegaly, normal bowel sounds Musculoskeletal:  Positive range of motion in upper and lower extremities Extremities: +2 pulses, trace edema Skin:  Warm and dry  LABS:  BMET  Recent Labs Lab 01/11/16 0331 01/11/16 0950 01/12/16 0542  NA 135 139 138  K  7.5* 5.6* 4.2  CL 102 102 99*  CO2  18* 21* 29  BUN 86* 72* 41*  CREATININE 15.75* 12.40* 8.63*  GLUCOSE 118* 122* 119*    Electrolytes  Recent Labs Lab 01/11/16 0331 01/11/16 0950 01/12/16 0542  CALCIUM 7.8* 8.4* 8.0*  MG 2.2  --  2.0  PHOS 8.6*  --  6.9*    CBC  Recent Labs Lab 01/11/16 0331 01/11/16 0950 01/12/16 0542  WBC 5.8 4.6 7.5  HGB 8.1* 8.9* 7.3*  HCT 24.3* 26.9* 22.0*  PLT 264 302 242    Coag's No results for input(s): APTT, INR in the last 168 hours.  Sepsis Markers  Recent Labs Lab 01/10/16 2107 01/10/16 2139  LATICACIDVEN  --  0.8  PROCALCITON 1.76  --     ABG  Recent Labs Lab 01/10/16 2300 01/11/16 0400 01/12/16 0450  PHART 7.29* 7.33* 7.50*  PCO2ART 47 37 45  PO2ART 98 97 67*    Liver Enzymes  Recent Labs Lab 01/08/16 1723 01/10/16 1308 01/11/16 0950  AST 17 24 31   ALT 14 20 37  ALKPHOS 51 58 53  BILITOT 0.5 0.7 0.4  ALBUMIN 3.7 3.6 3.6    Cardiac Enzymes  Recent Labs Lab 01/10/16 2107 01/11/16 0331 01/11/16 0950  TROPONINI 0.08* 0.07* 0.05*    Glucose  Recent Labs Lab 01/11/16 0715 01/11/16 1126 01/11/16 1534 01/11/16 1948 01/11/16 2330 01/12/16 0338  GLUCAP 122* 83 86 84 106* 111*    Imaging US Abdomen Complete  01/11/2016  CLINICAL DATA:  Abdominal pain for 1 week. EXAM: ABDOMEN ULTRASOUND COMPLETE COMPARISON:  None. FINDINGS: Gallbladder: No gallstones or wall thickening visualized. No sonographic Murphy sign noted by sonographer. Common bile duct: Diameter: 8.1 mm. Liver: The liver has a coarsened echotexture. No focal liver abnormalities. Mild intrahepatic bile duct dilatation. IVC: No abnormality visualized. Pancreas: Not visualized due to overlying bowel gas Spleen: Size and appearance within normal limits. Right Kidney: Length: 7 cm. Diffuse cortical thinning. Increased parenchymal echogenicity. No hydronephrosis or mass. Left Kidney: Length: 7 cm. Increased parenchymal echogenicity. Diffuse cortical thinning. No mass or  hydronephrosis. Abdominal aorta: No aneurysm visualized. Other findings: None. IMPRESSION: 1. Increase caliber of the common bile duct with mild intrahepatic bile duct dilatation. If there is a clinical concern for choledocholithiasis then MRCP may be helpful for further assessment. 2. Bilateral echogenic kidneys with diffuse cortical thinning compatible with chronic medical did renal disease. 3. Coarsened liver echotexture. Electronically Signed   By: Kerby Moors M.D.   On: 01/11/2016 10:34   Dg Chest Port 1 View  01/11/2016  CLINICAL DATA:  Evaluate central line placement EXAM: PORTABLE CHEST 1 VIEW COMPARISON:  01/11/2016 FINDINGS: The endotracheal tube tip is above the carina. The left IJ catheter tip is in the projection of the SVC. There is an nasogastric tube with tip in the stomach. Heart size appears normal. There is moderate interstitial edema which appears similar to the previous exam. IMPRESSION: 1. CHF, similar to previous exam. 2. Stable support apparatus. 3. The left IJ catheter tip is in the projection of the SVC. No pneumothorax identified. Electronically Signed   By: Kerby Moors M.D.   On: 01/11/2016 09:09    [facility] ANP-BC Pulmonary and [place] Pager [phone] or [phone]    01/12/2016, 6:14 AM   Patient seen and examined with NP, agree with assessment and plan. The patient presented after missing dialysis with acute respiratory failure and volume overload. Review of  her chest x-ray, her pulmonary edema appears to have significant improvement, review of her lab work today is unremarkable. Her lungs are clear to auscultation and sound improved from previous days. We'll perform a weaning trial, and consider extubation if this is tolerated. Marda Stalker M.D. 01/12/2016   Critical Care Attestation.  I have personally obtained a history, examined the patient, evaluated laboratory and imaging results, formulated the  assessment and plan and placed orders. The Patient requires high complexity decision making for assessment and support, frequent evaluation and titration of therapies, application of advanced monitoring technologies and extensive interpretation of multiple databases. The patient has critical illness that could lead imminently to failure of 1 or more organ systems and requires the highest level of physician preparedness to intervene.  Critical Care Time devoted to patient care services described in this note is 35 minutes and is exclusive of time spent in procedures.

## 2016-01-13 ENCOUNTER — Encounter: Payer: Self-pay | Admitting: Internal Medicine

## 2016-01-13 ENCOUNTER — Inpatient Hospital Stay: Payer: Medicare Other

## 2016-01-13 DIAGNOSIS — R451 Restlessness and agitation: Secondary | ICD-10-CM

## 2016-01-13 DIAGNOSIS — R109 Unspecified abdominal pain: Secondary | ICD-10-CM

## 2016-01-13 LAB — TYPE AND SCREEN
ABO/RH(D): O POS
ANTIBODY SCREEN: NEGATIVE
Unit division: 0

## 2016-01-13 LAB — BASIC METABOLIC PANEL
ANION GAP: 10 (ref 5–15)
BUN: 23 mg/dL — AB (ref 6–20)
CHLORIDE: 101 mmol/L (ref 101–111)
CO2: 30 mmol/L (ref 22–32)
CREATININE: 6.13 mg/dL — AB (ref 0.44–1.00)
Calcium: 8.4 mg/dL — ABNORMAL LOW (ref 8.9–10.3)
GFR calc Af Amer: 8 mL/min — ABNORMAL LOW (ref >60)
GFR, EST NON AFRICAN AMERICAN: 7 mL/min — AB (ref >60)
GLUCOSE: 96 mg/dL (ref 65–99)
POTASSIUM: 4.3 mmol/L (ref 3.5–5.1)
Sodium: 141 mmol/L (ref 135–145)

## 2016-01-13 LAB — AMMONIA: Ammonia: 11 umol/L (ref 9–35)

## 2016-01-13 LAB — BLOOD GAS, ARTERIAL
ACID-BASE EXCESS: 7.6 mmol/L — AB (ref 0.0–3.0)
Allens test (pass/fail): POSITIVE — AB
Bicarbonate: 33.4 mEq/L — ABNORMAL HIGH (ref 21.0–28.0)
FIO2: 0.28
O2 Saturation: 95.1 %
PCO2 ART: 54 mmHg — AB (ref 32.0–48.0)
PEEP/CPAP: 5 cmH2O
Patient temperature: 37
Pressure support: 5 cmH2O
pH, Arterial: 7.4 (ref 7.350–7.450)
pO2, Arterial: 76 mmHg — ABNORMAL LOW (ref 83.0–108.0)

## 2016-01-13 LAB — GLUCOSE, CAPILLARY
GLUCOSE-CAPILLARY: 71 mg/dL (ref 65–99)
GLUCOSE-CAPILLARY: 70 mg/dL (ref 65–99)
GLUCOSE-CAPILLARY: 79 mg/dL (ref 65–99)
GLUCOSE-CAPILLARY: 215 mg/dL — AB (ref 65–99)
Glucose-Capillary: 94 mg/dL (ref 65–99)
Glucose-Capillary: 68 mg/dL (ref 65–99)

## 2016-01-13 LAB — CBC
HEMATOCRIT: 27.1 % — AB (ref 35.0–47.0)
Hemoglobin: 8.7 g/dL — ABNORMAL LOW (ref 12.0–16.0)
MCH: 28.5 pg (ref 26.0–34.0)
MCHC: 32.2 g/dL (ref 32.0–36.0)
MCV: 88.3 fL (ref 80.0–100.0)
Platelets: 245 10*3/uL (ref 150–440)
RBC: 3.07 MIL/uL — AB (ref 3.80–5.20)
RDW: 18.6 % — AB (ref 11.5–14.5)
WBC: 9.9 10*3/uL (ref 3.6–11.0)

## 2016-01-13 LAB — PHOSPHORUS: Phosphorus: 6.6 mg/dL — ABNORMAL HIGH (ref 2.5–4.6)

## 2016-01-13 LAB — MAGNESIUM: Magnesium: 2.1 mg/dL (ref 1.7–2.4)

## 2016-01-13 MED ORDER — FAMOTIDINE IN NACL 20-0.9 MG/50ML-% IV SOLN
20.0000 mg | INTRAVENOUS | Status: DC
  Administered 2016-01-15: 20 mg via INTRAVENOUS
  Filled 2016-01-13: qty 50

## 2016-01-13 MED ORDER — AMLODIPINE BESYLATE 5 MG PO TABS: 5.0000 mg | ORAL_TABLET | Freq: Every day | ORAL | Status: DC

## 2016-01-13 MED ORDER — HALOPERIDOL LACTATE 5 MG/ML IJ SOLN
INTRAMUSCULAR | Status: AC
  Filled 2016-01-13: qty 1

## 2016-01-13 MED ORDER — AMLODIPINE BESYLATE 5 MG PO TABS
5.0000 mg | ORAL_TABLET | Freq: Every day | ORAL | Status: DC
  Administered 2016-01-14 – 2016-01-15 (×2): 5 mg via ORAL
  Filled 2016-01-13 (×2): qty 1

## 2016-01-13 MED ORDER — FUROSEMIDE 40 MG PO TABS
80.0000 mg | ORAL_TABLET | Freq: Every day | ORAL | Status: DC
Start: 2016-01-13 — End: 2016-01-17
  Administered 2016-01-14 – 2016-01-16 (×3): 80 mg via ORAL
  Filled 2016-01-13 (×3): qty 2

## 2016-01-13 MED ORDER — HALOPERIDOL LACTATE 5 MG/ML IJ SOLN
1.0000 mg | INTRAMUSCULAR | Status: DC | PRN
  Administered 2016-01-13 (×2): 4 mg via INTRAVENOUS
  Filled 2016-01-13: qty 1

## 2016-01-13 MED ORDER — LORAZEPAM 2 MG/ML IJ SOLN
1.0000 mg | INTRAMUSCULAR | Status: DC | PRN
  Administered 2016-01-13 – 2016-01-16 (×7): 1 mg via INTRAVENOUS
  Filled 2016-01-13 (×7): qty 1

## 2016-01-13 MED ORDER — DEXTROSE 50 % IV SOLN
1.0000 | Freq: Once | INTRAVENOUS | Status: AC
  Administered 2016-01-13: 50 mL via INTRAVENOUS

## 2016-01-13 MED ORDER — OXYCODONE-ACETAMINOPHEN 7.5-325 MG PO TABS
1.0000 | ORAL_TABLET | ORAL | Status: DC | PRN
  Administered 2016-01-14 – 2016-01-15 (×3): 1 via ORAL
  Filled 2016-01-13 (×3): qty 1

## 2016-01-13 MED ORDER — FUROSEMIDE 40 MG PO TABS: 80.0000 mg | ORAL_TABLET | Freq: Every day | ORAL | Status: DC

## 2016-01-13 MED ORDER — DEXTROSE 50 % IV SOLN
INTRAVENOUS | Status: AC
  Administered 2016-01-13: 50 mL via INTRAVENOUS
  Filled 2016-01-13: qty 50

## 2016-01-13 MED ORDER — METOPROLOL SUCCINATE ER 50 MG PO TB24
50.0000 mg | ORAL_TABLET | Freq: Every day | ORAL | Status: DC
  Administered 2016-01-14 – 2016-01-16 (×3): 50 mg via ORAL
  Filled 2016-01-13 (×3): qty 1

## 2016-01-13 MED ORDER — INSULIN ASPART 100 UNIT/ML ~~LOC~~ SOLN
0.0000 [IU] | SUBCUTANEOUS | Status: DC
  Administered 2016-01-15 – 2016-01-16 (×2): 1 [IU] via SUBCUTANEOUS
  Filled 2016-01-13 (×2): qty 1

## 2016-01-13 NOTE — Progress Notes (Signed)
PT Cancellation Note  Patient Details Name: Jasmine Holloway MRN: [id number] DOB: [birth year]   Cancelled Treatment:    Reason Eval/Treat Not Completed: Other (comment). Pt's chart reviewed. Code 300 just recently called because pt got agitated, combative, impulsive and was unable to redirect. Pt was medicated and is now calm and asleep per RN note. Pt has 1:1 bedside sitter. Pt is currently not appropriate to participate in therapy. PT will f/u and complete evaluation when appropriate.   Neoma Laming, PT, DPT  01/13/2016, 1:48 PM [phone]

## 2016-01-13 NOTE — Progress Notes (Signed)
Hemodialysis start 

## 2016-01-13 NOTE — Care Management Note (Signed)
Patient is active at [place] on MWF schedule 3rd shift. I have sent admissioin records and will forward additional records at discharge.  [facility]  Dialysis Liaison  [phone]

## 2016-01-13 NOTE — Care Management (Addendum)
Patient is from home with history of no shows to dialysis. [facility] dialysis aware of admission. Per Maudie Mercury she spoke with the dialysis center and patient has only missed one dialysis appointment in OP setting and it was because of this hospital illness. She is scheduled to go to [address] [facility] for dialysis. She is currently 1:1 for behaviors for safety. RNCM will need to continue to follow. intubated and extubated on 01/12/2016--confused. Still on O2.

## 2016-01-13 NOTE — Progress Notes (Signed)
Pt had two other episodes of agitation, and pulling at medical devices at the beginning of dialysis. Medicated with haldol and later ativan. Presently getting dialysis. Therapies unable to evaluate today due to mental status.

## 2016-01-13 NOTE — Progress Notes (Signed)
Code 300 called because pt got agitated, combative, impulsive and was unable to redirect. Was trying to get out of bed. Was on .5 mg of ativan that was not due yet. On Precedex gtt 1.2 mcg that was ineffective at the time. MD in room for code 300 and ordered haldol, but ativan was effective.Ativan increased to 1 mg q 4 now.  Pt now calm and asleep. Continues to have 1:1 sitter at bedside.

## 2016-01-13 NOTE — Progress Notes (Signed)
Subjective:   Patient is confused, requiring sitter [place] oxygen but RR is high    Objective:  Vital signs in last 24 hours:  Temp:  [97.6 F (36.4 C)-97.7 F (36.5 C)] 97.7 F (36.5 C) (06/05 0000) Pulse Rate:  [61-92] 79 (06/05 0800) Resp:  [8-23] 21 (06/05 0800) BP: (96-185)/(47-116) 169/85 mmHg (06/05 0800) SpO2:  [91 %-100 %] 99 % (06/05 0833) FiO2 (%):  [28 %] 28 % (06/04 1155) Weight:  [85.7 kg (188 lb 15 oz)-88.6 kg (195 lb 5.2 oz)] 85.7 kg (188 lb 15 oz) (06/05 0500)  Weight change: 0.1 kg (3.5 oz) Filed Weights   01/12/16 1130 01/12/16 1500 01/13/16 0500  Weight: 88.6 kg (195 lb 5.2 oz) 86.3 kg (190 lb 4.1 oz) 85.7 kg (188 lb 15 oz)    Intake/Output:    Intake/Output Summary (Last 24 hours) at 01/13/16 0910 Last data filed at 01/13/16 0700  Gross per 24 hour  Intake 495.21 ml  Output   1875 ml  Net -1379.79 ml     Physical Exam: General: Critically ill  HEENT  eyes closed, mouth breather  Neck supple  Pulm/lungs Bilateral crackles,    CVS/Heart regular  Abdomen:  Soft, nontender  Extremities: +trace edema  Neurologic: Lethargic, did not follow commands  Skin: No acute rashes  Access: AVG- rt arm       Basic Metabolic Panel:   Recent Labs Lab 01/10/16 1308 01/11/16 0331 01/11/16 0950 01/12/16 0542 01/13/16 0442  NA 140 135 139 138 141  K 5.0 7.5* 5.6* 4.2 4.3  CL 103 102 102 99* 101  CO2 21* 18* 21* 29 30  GLUCOSE 100* 118* 122* 119* 96  BUN 76* 86* 72* 41* 23*  CREATININE 14.54* 15.75* 12.40* 8.63* 6.13*  CALCIUM 8.9 7.8* 8.4* 8.0* 8.4*  MG  --  2.2  --  2.0 2.1  PHOS  --  8.6*  --  6.9* 6.6*     CBC:  Recent Labs Lab 01/10/16 1308 01/11/16 0331 01/11/16 0950 01/12/16 0542 01/12/16 0815 01/13/16 0442  WBC 7.4 5.8 4.6 7.5  --  9.9  HGB 9.1* 8.1* 8.9* 7.3* 7.2* 8.7*  HCT 27.9* 24.3* 26.9* 22.0*  --  27.1*  MCV 89.3 89.0 88.0 88.4  --  88.3  PLT 301 264 302 242  --  245      Microbiology:  Recent Results (from the  past 720 hour(s))  MRSA PCR Screening     Status: None   Collection Time: 12/21/15  6:10 AM  Result Value Ref Range Status   MRSA by PCR NEGATIVE NEGATIVE Final    Comment:        The GeneXpert MRSA Assay (FDA approved for NASAL specimens only), is one component of a comprehensive MRSA colonization surveillance program. It is not intended to diagnose MRSA infection nor to guide or monitor treatment for MRSA infections.   Urine culture     Status: Abnormal   Collection Time: 01/10/16  1:08 PM  Result Value Ref Range Status   Specimen Description URINE, CLEAN CATCH  Final   Special Requests NONE  Final   Culture MULTIPLE SPECIES PRESENT, SUGGEST RECOLLECTION (A)  Final   Report Status 01/12/2016 FINAL  Final  Culture, blood (routine x 2)     Status: None (Preliminary result)   Collection Time: 01/10/16  9:39 PM  Result Value Ref Range Status   Specimen Description BLOOD LEFT ASSIST CONTROL  Final   Special Requests   Final  BOTTLES DRAWN AEROBIC AND ANAEROBIC 12CCAERO,10CCANA   Culture NO GROWTH 2 DAYS  Final   Report Status PENDING  Incomplete  Culture, blood (routine x 2)     Status: None (Preliminary result)   Collection Time: 01/10/16 11:13 PM  Result Value Ref Range Status   Specimen Description BLOOD LEFT HAND  Final   Special Requests BOTTLES DRAWN AEROBIC AND ANAEROBIC [place]  Final   Culture NO GROWTH 2 DAYS  Final   Report Status PENDING  Incomplete  MRSA PCR Screening     Status: None   Collection Time: 01/10/16 11:13 PM  Result Value Ref Range Status   MRSA by PCR NEGATIVE NEGATIVE Final    Comment:        The GeneXpert MRSA Assay (FDA approved for NASAL specimens only), is one component of a comprehensive MRSA colonization surveillance program. It is not intended to diagnose MRSA infection nor to guide or monitor treatment for MRSA infections.     Coagulation Studies: No results for input(s): LABPROT, INR in the last 72  hours.  Urinalysis:  Recent Labs  01/10/16 1308  COLORURINE STRAW*  LABSPEC 1.008  PHURINE 8.0  GLUCOSEU 150*  HGBUR 1+*  BILIRUBINUR NEGATIVE  KETONESUR NEGATIVE  PROTEINUR 100*  NITRITE NEGATIVE  LEUKOCYTESUR NEGATIVE      Imaging: Dg Chest 1 View  01/12/2016  CLINICAL DATA:  Dyspnea EXAM: CHEST 1 VIEW COMPARISON:  01/11/2016 FINDINGS: ET tube tip is above the carina. There is a left IJ catheter with tip in the projection of the SVC. Stable cardiac enlargement. Left pleural effusion and moderate interstitial edema is identified compatible with CHF. IMPRESSION: 1. Stable support apparatus. 2. Similar appearance of CHF pattern. Electronically Signed   By: Kerby Moors M.D.   On: 01/12/2016 07:23   US Abdomen Complete  01/11/2016  CLINICAL DATA:  Abdominal pain for 1 week. EXAM: ABDOMEN ULTRASOUND COMPLETE COMPARISON:  None. FINDINGS: Gallbladder: No gallstones or wall thickening visualized. No sonographic Murphy sign noted by sonographer. Common bile duct: Diameter: 8.1 mm. Liver: The liver has a coarsened echotexture. No focal liver abnormalities. Mild intrahepatic bile duct dilatation. IVC: No abnormality visualized. Pancreas: Not visualized due to overlying bowel gas Spleen: Size and appearance within normal limits. Right Kidney: Length: 7 cm. Diffuse cortical thinning. Increased parenchymal echogenicity. No hydronephrosis or mass. Left Kidney: Length: 7 cm. Increased parenchymal echogenicity. Diffuse cortical thinning. No mass or hydronephrosis. Abdominal aorta: No aneurysm visualized. Other findings: None. IMPRESSION: 1. Increase caliber of the common bile duct with mild intrahepatic bile duct dilatation. If there is a clinical concern for choledocholithiasis then MRCP may be helpful for further assessment. 2. Bilateral echogenic kidneys with diffuse cortical thinning compatible with chronic medical did renal disease. 3. Coarsened liver echotexture. Electronically Signed   By: Kerby Moors M.D.   On: 01/11/2016 10:34   Dg Chest Port 1 View  01/13/2016  CLINICAL DATA:  Acute respiratory failure, history of asthma -COPD, end-stage renal disease, diabetes. EXAM: PORTABLE CHEST 1 VIEW COMPARISON:  Portable chest x-ray of January 12, 2016 FINDINGS: The lungs are adequately inflated. The interstitial markings remain increased bilaterally. There is no significant pleural effusion. The cardiac silhouette remains enlarged. The retrocardiac density on the left remains increased. The trachea has been extubated as has the esophagus. The left internal jugular venous catheter tip projects over the junction of the right and left brachiocephalic veins. There surgical clips in the left axillary region. IMPRESSION: Interval extubation of the trachea. The  lungs remain well-expanded. There is stable cardiomegaly. Mild pulmonary interstitial edema is present. Left lower lobe atelectasis or pneumonia is suspected. Electronically Signed   By: David  Martinique M.D.   On: 01/13/2016 07:25     Medications:   . dexmedetomidine 1.2 mcg/kg/hr (01/13/16 0831)   . amLODipine  5 mg Oral Daily  . antiseptic oral rinse  7 mL Mouth Rinse q12n4p  . budesonide (PULMICORT) nebulizer solution  0.25 mg Nebulization Q12H  . chlorhexidine  15 mL Mouth Rinse BID  . epoetin (EPOGEN/PROCRIT) injection  10,000 Units Intravenous Q M,W,F-HD  . famotidine (PEPCID) IV  20 mg Intravenous Q24H  . furosemide  80 mg Oral Daily  . heparin  5,000 Units Subcutaneous Q8H  . metoprolol succinate  50 mg Oral Daily   sodium chloride, acetaminophen, albuterol, bisacodyl, hydrALAZINE, ipratropium-albuterol, LORazepam, midazolam, midazolam, ondansetron (ZOFRAN) IV, sennosides  Assessment/ Plan:  55 y.o.black female with ESRD secondary to polycystic kidney hypertension, GERD, peritoneal dialysis h/o peritonitis, arthritis, chronic left hand pain, ruptured access   MWF 3rd shift, [facility] [address]   1. End Stage Renal Disease: with  hyperkalemia and metabolic acidosis: emergent hemodialysis yesterday for hyperkalemia and pulmonary edema. Also received IV contrast in ED. Plan for dialysis today UF as tolerated  2. Acute Respiratory failure with Acute pulmonary edema:required mechanical ventilation - UF with dialysis - 1500 cc removed with HD yesterday  3. Anemia of chronic kidney disease: hemogobin 8.7 - EPO with MWF schedule  4. Secondary Hyperparathyroidism: hyperphosphatemia as outpatient with phos of 6 on 12/16/15 - hold cinacalcet, holding binders.    LOS:  Kalissa Grays 6/5/20179:10 AM

## 2016-01-13 NOTE — Progress Notes (Signed)
PULMONARY / CRITICAL CARE MEDICINE   Name: Jasmine Holloway MRN: [id number] DOB: Jan 03, 1961    ADMISSION DATE:  01/10/2016   DISCUSSION: 55 year old African-American female presenting with acute pulmonary edema secondary to volume overload from non-adherence to dialysis, acute hypoxic and hypercarbic respiratory failure, and abdominal pain of unknown etiology. Abdominal pain likely due to diabetic gastroparesis than to any infectious process.   ASSESSMENT / PLAN:  PULMONARY A: Acute hypoxic/hypercarbic respiratory failure S/P extubation 06/04  History of COPD/asthma Pulmonary edema P:   -Supplemental O2 -Nebulized steroids and bronchodilators  CARDIOVASCULAR A:  Hypertension Volume overload secondary to noncompliance with dialysis P:  -Hemodynamics per ICU protocol -Hydralazine 10 mg IV every 4 hours and labetalol Q2H when necessary for systolic blood pressure greater than 170 -Dialysis and diuresis per nephrology  RENAL A:   End-stage renal disease on dialysis Polycystic kidney disease Severe hypokalemia-potassium down to 4.3 post dialysis; HD 6/3 AND 6/4 P:   -Hemodialysis per nephrology -Monitor and correct electrolyte imbalances   GASTROINTESTINAL A:   Abdominal pain-unclear etiology;  rule out diabetic gastroparesis-abdominal ultrasound does not show any acute changes except for questionable cholelithiasis History of ventral hernia P:   -Monitor abdominal pain post extubation -PPI stress ulcer prophylaxis -Reglan 5 mg every 6 hours when necessary for nausea and vomiting  HEMATOLOGIC A:   Chronic anemia-hemoglobin 8.7 s/p transfusion P:  -Trend CBC. -Transfused 1 unit of packed red blood cells 06/04 -Heparin for VTE prophylaxis  INFECTIOUS A:   No acute issues-pro-calcitonin mildly elevated P:   -Follow-up cultures -No antibiotics for now  CULTURES: Blood cultures 2. MRSA screen negative. Urine culture  ANTIBIOTICS: None  ENDOCRINE A:   Type 2  diabetes mellitus P:   -Blood glucose monitoring with sliding scale insulin coverage  NEUROLOGIC A:   Altered mental status-improved Acute agitation and anxiety- unclear etiology P:   RASS goal: n/a  -Precedex and lorazepam for agitation   STUDIES:  Ultrasound abdomen 01/11/2016-done see report   SIGNIFICANT EVENTS: 01/10/2016 ED with abdominal pain, became agitated, intubated for airway protection  LINES/TUBES: Peripheral IVs Dialysis shunt  --------------------------------  SUBJECTIVE: Extubated; now on [place]; gets agitated with minimal stimulation  VITAL SIGNS: BP 180/79 mmHg  Pulse 79  Temp(Src) 97.6 F (36.4 C) (Axillary)  Resp 20  Ht 5\' 3"  (1.6 m)  Wt 188 lb 15 oz (85.7 kg)  BMI 33.48 kg/m2  SpO2 98%  HEMODYNAMICS:    VENTILATOR SETTINGS: Vent Mode:  [-] Spontaneous FiO2 (%):  [28 %] 28 % PEEP:  [5 cmH20] 5 cmH20  INTAKE / OUTPUT: I/O last 3 completed shifts: In: 1785.3 [I.V.:1276; NG/GT:459.3; IV Piggyback:50] Out: 2485 [Urine:185; Other:2300]  PHYSICAL EXAMINATION: General: Chronically ill-looking, no distress Neuro:  Awakens to voice and noxious stimulus, moves all extremities to pain; easily gets agitated HEENT: PEERLA, conjunctivae pink, trachea midline Cardiovascular:  Rate and rhythm regular, S1, S2, no murmur, regurg or gallop Lungs: Clear to auscultation bilaterally without wheezes or rhonchi Abdomen: No palpable organomegaly, normal bowel sounds Musculoskeletal:  Positive range of motion in upper and lower extremities Extremities: +2 pulses, trace edema Skin:  Warm and dry  LABS:  BMET  Recent Labs Lab 01/11/16 0950 01/12/16 0542 01/13/16 0442  NA 139 138 141  K 5.6* 4.2 4.3  CL 102 99* 101  CO2 21* 29 30  BUN 72* 41* 23*  CREATININE 12.40* 8.63* 6.13*  GLUCOSE 122* 119* 96    Electrolytes  Recent Labs Lab 01/11/16 0331 01/11/16 0950 01/12/16 0542  01/13/16 0442  CALCIUM 7.8* 8.4* 8.0* 8.4*  MG 2.2  --  2.0 2.1   PHOS 8.6*  --  6.9* 6.6*    CBC  Recent Labs Lab 01/11/16 0950 01/12/16 0542 01/12/16 0815 01/13/16 0442  WBC 4.6 7.5  --  9.9  HGB 8.9* 7.3* 7.2* 8.7*  HCT 26.9* 22.0*  --  27.1*  PLT 302 242  --  245    Coag's No results for input(s): APTT, INR in the last 168 hours.  Sepsis Markers  Recent Labs Lab 01/10/16 2107 01/10/16 2139  LATICACIDVEN  --  0.8  PROCALCITON 1.76  --     ABG  Recent Labs Lab 01/11/16 0400 01/12/16 0450 01/12/16 0848  PHART 7.33* 7.50* 7.40  PCO2ART 37 45 54*  PO2ART 97 67* 76*    Liver Enzymes  Recent Labs Lab 01/08/16 1723 01/10/16 1308 01/11/16 0950  AST 17 24 31   ALT 14 20 37  ALKPHOS 51 58 53  BILITOT 0.5 0.7 0.4  ALBUMIN 3.7 3.6 3.6    Cardiac Enzymes  Recent Labs Lab 01/10/16 2107 01/11/16 0331 01/11/16 0950  TROPONINI 0.08* 0.07* 0.05*    Glucose  Recent Labs Lab 01/11/16 2330 01/12/16 0338 01/12/16 0715 01/12/16 1139 01/12/16 1945 01/13/16 0535  GLUCAP 106* 111* 109* 96 96 94    Imaging No results found.  Evalyse Stroope S. [facility] ANP-BC Pulmonary and Critical Care Medicine [facility] Pager [phone] or [phone]    01/13/2016, 6:48 AM

## 2016-01-13 NOTE — Progress Notes (Signed)
Pre-hd tx 

## 2016-01-13 NOTE — Progress Notes (Signed)
Speech Therapy Note: received order, reviewed chart notes. A Code 300 was just recently called because pt became agitated, combative, impulsive, and was unable to redirect w/ NSG intervention. Pt was medicated and is now calm and asleep per chart notes. Pt has 1:1 bedside sitter. Pt is currently not appropriate to safely participate in BSE. ST services will f/u in morning for assessment if appropriate.

## 2016-01-13 NOTE — Care Management Note (Signed)
I have spoken with patients outpatient dialysis center MSW Lattie Haw about patients attendance at the center.  Patient missed no treatments in the month of May.  She does sometimes call the clinic and changes her time but never misses.  Patient was not in the center on Friday June 2nd because she was sick.  Leading to this admission.  Iran Sizer  Dialysis Coordinator

## 2016-01-13 NOTE — Progress Notes (Signed)
[place] [facility] [place] NAME: Jasmine Holloway    MR#:  [id number]  DATE OF BIRTH:  02/22/1961  SUBJECTIVE:  CHIEF COMPLAINT:   Chief Complaint  Patient presents with  . Abdominal Pain   Admitted 01/10/2016 by pulmonary critical care for respiratory distress due to pulmonary edema after missing dialysis. Patient was intubated and extubated on 01/12/2016 after dialysis. She has been confused since extubation. She presented to the emergency room with abdominal pain and then decompensated. Jasmine Holloway. On 3 L nasal cannula.   REVIEW OF SYSTEMS:    Review of Systems  Unable to perform ROS: mental status change   DRUG ALLERGIES:   Allergies  Allergen Reactions  . Morphine And Related Nausea And Vomiting  . Vicodin [Hydrocodone-Acetaminophen] Nausea And Vomiting  . Buprenorphine Hcl Nausea And Vomiting  . Codeine Nausea And Vomiting    VITALS:  Blood pressure 194/93, pulse 79, temperature 97.7 F (36.5 C), temperature source Axillary, resp. rate 21, height 5\' 3"  (1.6 m), weight 85.7 kg (188 lb 15 oz), SpO2 99 %.  PHYSICAL EXAMINATION:   Physical Exam  GENERAL:  55 y.o.-year-old patient lying in the bed with no acute distress.  EYES: Pupils equal, round, reactive to light and accommodation. No scleral icterus. Extraocular muscles intact.  HEENT: Head atraumatic, normocephalic. Oropharynx and nasopharynx clear.  NECK:  Supple, no jugular venous distention. No thyroid enlargement, no tenderness.  LUNGS: Normal breath sounds bilaterally, no wheezing, rales, rhonchi. No use of accessory muscles of respiration.  CARDIOVASCULAR: S1, S2 normal. No murmurs, rubs, or gallops.  ABDOMEN: Soft, nontender, nondistended. Bowel sounds present. No organomegaly or mass.  EXTREMITIES: No cyanosis, clubbing or edema b/l.    NEUROLOGIC: Cranial nerves II through XII are intact. No focal Motor or  sensory deficits b/l.   PSYCHIATRIC: The patient is alert and oriented x 3.  SKIN: No obvious rash, lesion, or ulcer.   LABORATORY PANEL:   CBC  Recent Labs Lab 01/13/16 0442  WBC 9.9  HGB 8.7*  HCT 27.1*  PLT 245   ------------------------------------------------------------------------------------------------------------------ Chemistries   Recent Labs Lab 01/11/16 0950  01/13/16 0442  NA 139  < > 141  K 5.6*  < > 4.3  CL 102  < > 101  CO2 21*  < > 30  GLUCOSE 122*  < > 96  BUN 72*  < > 23*  CREATININE 12.40*  < > 6.13*  CALCIUM 8.4*  < > 8.4*  MG  --   < > 2.1  AST 31  --   --   ALT 37  --   --   ALKPHOS 53  --   --   BILITOT 0.4  --   --   < > = values in this interval not displayed. ------------------------------------------------------------------------------------------------------------------  Cardiac Enzymes  Recent Labs Lab 01/11/16 0950  TROPONINI 0.05*   ------------------------------------------------------------------------------------------------------------------  RADIOLOGY:  Dg Chest 1 View  01/12/2016  CLINICAL DATA:  Dyspnea EXAM: CHEST 1 VIEW COMPARISON:  01/11/2016 FINDINGS: ET tube tip is above the carina. There is a left IJ catheter with tip in the projection of the SVC. Stable cardiac enlargement. Left pleural effusion and moderate interstitial edema is identified compatible with CHF. IMPRESSION: 1. Stable support apparatus. 2. Similar appearance of CHF pattern. Electronically Signed   By: Kerby Moors M.D.   On: 01/12/2016 07:23   Dg Chest [facility] 1 [address]  01/13/2016  CLINICAL DATA:  Acute respiratory failure, history of asthma -COPD, end-stage renal disease, diabetes. EXAM: PORTABLE CHEST 1 VIEW COMPARISON:  Portable chest x-ray of January 12, 2016 FINDINGS: The lungs are adequately inflated. The interstitial markings remain increased bilaterally. There is no significant pleural effusion. The cardiac silhouette remains enlarged. The retrocardiac  density on the left remains increased. The trachea has been extubated as has the esophagus. The left internal jugular venous catheter tip projects over the junction of the right and left brachiocephalic veins. There surgical clips in the left axillary region. IMPRESSION: Interval extubation of the trachea. The lungs remain well-expanded. There is stable cardiomegaly. Mild pulmonary interstitial edema is present. Left lower lobe atelectasis or pneumonia is suspected. Electronically Signed   By: David  Martinique M.D.   On: 01/13/2016 07:25     ASSESSMENT AND PLAN:   * Acute hypoxic respiratory failure due to acute on chronic diastolic CHF Improved. Now extubated. Continues to be on oxygen. Discussed with Dr. Candiss Norse of nephrology and will get dialysis again today. Wean oxygen as tolerated. We'll discontinue Foley catheter.  * Acute encephalopathy CT scan of the head showed nothing acute. Could be ICU delirium. Would watch over the next 24 hours. Check ammonia, TSH, B12. Transfer out of ICU. Speech evaluation to see if we can start a diet. Will need neurology consult if no improvement.  * Anemia of chronic disease Monitor. To get Neupogen with dialysis.  * Accelerated hypertension Patient is presently nothing by mouth due to confusion. Use IV when necessary medications.  * Diabetes mellitus Sliding scale insulin  * Hyperkalemia has resolved with dialysis  All the records are reviewed and case discussed with Care Management/Social Workerr. Management plans discussed with the patient, family and they are in agreement.  CODE STATUS: FULL CODE  DVT Prophylaxis: SCDs  TOTAL TIME TAKING CARE OF THIS PATIENT: 40 minutes.    POSSIBLE D/C IN 2-3 DAYS, DEPENDING ON CLINICAL CONDITION.  Hillary Bow R M.D on 01/13/2016 at 10:46 AM  Between 7am to 6pm - Pager - [phone]  After 6pm go to [email or web address] - password [email or web address] [facility]  Office   [phone]  CC: Primary care physician; Elyse Jarvis, MD  Note: This dictation was prepared with Dragon dictation along with smaller phrase technology. Any transcriptional errors that result from this process are unintentional.

## 2016-01-13 NOTE — Progress Notes (Signed)
Post dialysis 

## 2016-01-13 NOTE — Progress Notes (Signed)
Dialysis complete

## 2016-01-14 DIAGNOSIS — N186 End stage renal disease: Secondary | ICD-10-CM

## 2016-01-14 DIAGNOSIS — Z992 Dependence on renal dialysis: Secondary | ICD-10-CM

## 2016-01-14 LAB — CBC WITH DIFFERENTIAL/PLATELET
Basophils Absolute: 0.1 10*3/uL (ref 0–0.1)
Basophils Relative: 1 %
EOS ABS: 0.7 10*3/uL (ref 0–0.7)
Eosinophils Relative: 7 %
HEMATOCRIT: 27.6 % — AB (ref 35.0–47.0)
HEMOGLOBIN: 9.1 g/dL — AB (ref 12.0–16.0)
LYMPHS ABS: 1.2 10*3/uL (ref 1.0–3.6)
Lymphocytes Relative: 13 %
MCH: 29.1 pg (ref 26.0–34.0)
MCHC: 32.9 g/dL (ref 32.0–36.0)
MCV: 88.4 fL (ref 80.0–100.0)
Monocytes Absolute: 0.9 10*3/uL (ref 0.2–0.9)
NEUTROS ABS: 6.6 10*3/uL — AB (ref 1.4–6.5)
Platelets: 249 10*3/uL (ref 150–440)
RBC: 3.12 MIL/uL — AB (ref 3.80–5.20)
RDW: 17.8 % — ABNORMAL HIGH (ref 11.5–14.5)
WBC: 9.4 10*3/uL (ref 3.6–11.0)

## 2016-01-14 LAB — BASIC METABOLIC PANEL
Anion gap: 11 (ref 5–15)
BUN: 20 mg/dL (ref 6–20)
CHLORIDE: 103 mmol/L (ref 101–111)
CO2: 28 mmol/L (ref 22–32)
Calcium: 8.7 mg/dL — ABNORMAL LOW (ref 8.9–10.3)
Creatinine, Ser: 5.58 mg/dL — ABNORMAL HIGH (ref 0.44–1.00)
GFR calc non Af Amer: 8 mL/min — ABNORMAL LOW (ref >60)
GFR, EST AFRICAN AMERICAN: 9 mL/min — AB (ref >60)
Glucose, Bld: 81 mg/dL (ref 65–99)
POTASSIUM: 4.2 mmol/L (ref 3.5–5.1)
SODIUM: 142 mmol/L (ref 135–145)

## 2016-01-14 LAB — GLUCOSE, CAPILLARY
GLUCOSE-CAPILLARY: 65 mg/dL (ref 65–99)
GLUCOSE-CAPILLARY: 73 mg/dL (ref 65–99)
GLUCOSE-CAPILLARY: 75 mg/dL (ref 65–99)
GLUCOSE-CAPILLARY: 77 mg/dL (ref 65–99)
GLUCOSE-CAPILLARY: 77 mg/dL (ref 65–99)
GLUCOSE-CAPILLARY: 79 mg/dL (ref 65–99)
Glucose-Capillary: 78 mg/dL (ref 65–99)
Glucose-Capillary: 74 mg/dL (ref 65–99)

## 2016-01-14 MED ORDER — NEPRO/CARBSTEADY PO LIQD
237.0000 mL | Freq: Two times a day (BID) | ORAL | Status: DC
  Administered 2016-01-14 – 2016-01-17 (×6): 237 mL via ORAL

## 2016-01-14 NOTE — Progress Notes (Signed)
Subjective:   Mental status is better today    Objective:  Vital signs in last 24 hours:  Temp:  [97.7 F (36.5 C)-98.7 F (37.1 C)] 98.3 F (36.8 C) (06/06 1400) Pulse Rate:  [66-97] 83 (06/06 1500) Resp:  [14-26] 19 (06/06 1500) BP: (119-194)/(54-97) 119/97 mmHg (06/06 1500) SpO2:  [97 %-100 %] 97 % (06/06 1500) Weight:  [83.3 kg (183 lb 10.3 oz)-85.7 kg (188 lb 15 oz)] 83.3 kg (183 lb 10.3 oz) (06/06 0500)  Weight change: -2.9 kg (-6 lb 6.3 oz) Filed Weights   01/13/16 1700 01/13/16 2015 01/14/16 0500  Weight: 85.7 kg (188 lb 15 oz) 83.6 kg (184 lb 4.9 oz) 83.3 kg (183 lb 10.3 oz)    Intake/Output:    Intake/Output Summary (Last 24 hours) at 01/14/16 1545 Last data filed at 01/14/16 1516  Gross per 24 hour  Intake 556.42 ml  Output   2000 ml  Net -1443.58 ml     Physical Exam: General: Critically ill  HEENT  eyes closed, mouth breather  Neck supple  Pulm/lungs Bilateral crackles, improved  CVS/Heart regular  Abdomen:  Soft, nontender  Extremities: +trace edema  Neurologic: Lethargic,following simple commands  Skin: No acute rashes  Access: AVG- rt arm       Basic Metabolic Panel:   Recent Labs Lab 01/11/16 0331 01/11/16 0950 01/12/16 0542 01/13/16 0442 01/14/16 0434  NA 135 139 138 141 142  K 7.5* 5.6* 4.2 4.3 4.2  CL 102 102 99* 101 103  CO2 18* 21* 29 30 28   GLUCOSE 118* 122* 119* 96 81  BUN 86* 72* 41* 23* 20  CREATININE 15.75* 12.40* 8.63* 6.13* 5.58*  CALCIUM 7.8* 8.4* 8.0* 8.4* 8.7*  MG 2.2  --  2.0 2.1  --   PHOS 8.6*  --  6.9* 6.6*  --      CBC:  Recent Labs Lab 01/11/16 0331 01/11/16 0950 01/12/16 0542 01/12/16 0815 01/13/16 0442 01/14/16 0434  WBC 5.8 4.6 7.5  --  9.9 9.4  NEUTROABS  --   --   --   --   --  6.6*  HGB 8.1* 8.9* 7.3* 7.2* 8.7* 9.1*  HCT 24.3* 26.9* 22.0*  --  27.1* 27.6*  MCV 89.0 88.0 88.4  --  88.3 88.4  PLT 264 302 242  --  245 249      Microbiology:  Recent Results (from the past 720  hour(s))  MRSA PCR Screening     Status: None   Collection Time: 12/21/15  6:10 AM  Result Value Ref Range Status   MRSA by PCR NEGATIVE NEGATIVE Final    Comment:        The GeneXpert MRSA Assay (FDA approved for NASAL specimens only), is one component of a comprehensive MRSA colonization surveillance program. It is not intended to diagnose MRSA infection nor to guide or monitor treatment for MRSA infections.   Urine culture     Status: Abnormal   Collection Time: 01/10/16  1:08 PM  Result Value Ref Range Status   Specimen Description URINE, CLEAN CATCH  Final   Special Requests NONE  Final   Culture MULTIPLE SPECIES PRESENT, SUGGEST RECOLLECTION (A)  Final   Report Status 01/12/2016 FINAL  Final  Culture, blood (routine x 2)     Status: None (Preliminary result)   Collection Time: 01/10/16  9:39 PM  Result Value Ref Range Status   Specimen Description BLOOD LEFT ASSIST CONTROL  Final   Special Requests  Final    BOTTLES DRAWN AEROBIC AND ANAEROBIC 12CCAERO,10CCANA   Culture NO GROWTH 4 DAYS  Final   Report Status PENDING  Incomplete  Culture, blood (routine x 2)     Status: None (Preliminary result)   Collection Time: 01/10/16 11:13 PM  Result Value Ref Range Status   Specimen Description BLOOD LEFT HAND  Final   Special Requests BOTTLES DRAWN AEROBIC AND ANAEROBIC [place]  Final   Culture NO GROWTH 4 DAYS  Final   Report Status PENDING  Incomplete  MRSA PCR Screening     Status: None   Collection Time: 01/10/16 11:13 PM  Result Value Ref Range Status   MRSA by PCR NEGATIVE NEGATIVE Final    Comment:        The GeneXpert MRSA Assay (FDA approved for NASAL specimens only), is one component of a comprehensive MRSA colonization surveillance program. It is not intended to diagnose MRSA infection nor to guide or monitor treatment for MRSA infections.     Coagulation Studies: No results for input(s): LABPROT, INR in the last 72 hours.  Urinalysis: No  results for input(s): COLORURINE, LABSPEC, PHURINE, GLUCOSEU, HGBUR, BILIRUBINUR, KETONESUR, PROTEINUR, UROBILINOGEN, NITRITE, LEUKOCYTESUR in the last 72 hours.  Invalid input(s): APPERANCEUR    Imaging: Dg Chest Port 1 View  01/13/2016  CLINICAL DATA:  Acute respiratory failure, history of asthma -COPD, end-stage renal disease, diabetes. EXAM: PORTABLE CHEST 1 VIEW COMPARISON:  Portable chest x-ray of January 12, 2016 FINDINGS: The lungs are adequately inflated. The interstitial markings remain increased bilaterally. There is no significant pleural effusion. The cardiac silhouette remains enlarged. The retrocardiac density on the left remains increased. The trachea has been extubated as has the esophagus. The left internal jugular venous catheter tip projects over the junction of the right and left brachiocephalic veins. There surgical clips in the left axillary region. IMPRESSION: Interval extubation of the trachea. The lungs remain well-expanded. There is stable cardiomegaly. Mild pulmonary interstitial edema is present. Left lower lobe atelectasis or pneumonia is suspected. Electronically Signed   By: David  Martinique M.D.   On: 01/13/2016 07:25     Medications:   . dexmedetomidine Stopped (01/14/16 1516)   . amLODipine  5 mg Oral Daily  . antiseptic oral rinse  7 mL Mouth Rinse q12n4p  . budesonide (PULMICORT) nebulizer solution  0.25 mg Nebulization Q12H  . chlorhexidine  15 mL Mouth Rinse BID  . epoetin (EPOGEN/PROCRIT) injection  10,000 Units Intravenous Q M,W,F-HD  . [START ON 01/15/2016] famotidine (PEPCID) IV  20 mg Intravenous Q48H  . feeding supplement (NEPRO CARB STEADY)  237 mL Oral BID BM  . furosemide  80 mg Oral Daily  . heparin  5,000 Units Subcutaneous Q8H  . insulin aspart  0-9 Units Subcutaneous Q4H  . metoprolol succinate  50 mg Oral Daily   sodium chloride, acetaminophen, albuterol, bisacodyl, haloperidol lactate, hydrALAZINE, ipratropium-albuterol, LORazepam, ondansetron  (ZOFRAN) IV, oxyCODONE-acetaminophen, sennosides  Assessment/ Plan:  55 y.o.black female with ESRD secondary to polycystic kidney hypertension, GERD, peritoneal dialysis h/o peritonitis, arthritis, chronic left hand pain, ruptured access   MWF 3rd shift, [facility] [address]   1. End Stage Renal Disease: with hyperkalemia and metabolic acidosis: emergent hemodialysis yesterday for hyperkalemia and pulmonary edema. Also received IV contrast in ED. Plan for dialysis tomorrow UF as tolerated  2. Acute Respiratory failure with Acute pulmonary edema:required mechanical ventilation - UF with dialysis   3. Anemia of chronic kidney disease: hemogobin 9.1 - EPO with MWF schedule  4. Secondary Hyperparathyroidism: hyperphosphatemia as outpatient with phos of 6 on 12/16/15 - hold cinacalcet, holding binders.    LOS:  Murlean Iba 6/6/20173:45 PM

## 2016-01-14 NOTE — Progress Notes (Signed)
Informed NP that pt sugar at 1600 was 65. Pt was able to drink some sprite and her entire can of nephro supplemental drink. Sugar up to 74 after 15 minutes. Pt was having N/V medicated with zofran. Also medicated with iv Hydralazine for hypertension. Continue to monitor.

## 2016-01-14 NOTE — Progress Notes (Signed)
Pt still hypertensive and HR tachycardic, inquired with patient if she was having pain and pt said she is now having neck pain. Requested oxycodone and Percocet was administered for pain. Will continue to monitor at this time.

## 2016-01-14 NOTE — Progress Notes (Signed)
Nutrition Follow-up  DOCUMENTATION CODES:   Not applicable  INTERVENTION:  -Recommend addition of Nepro BID between meals.  -Cater to pt preferences -Feeding assistance as needed   NUTRITION DIAGNOSIS:   Inadequate oral intake related to acute illness as evidenced by NPO status.  Being addressed as diet advanced  GOAL:   Patient will meet greater than or equal to 90% of their needs  MONITOR:   PO intake, Supplement acceptance, Labs, Weight trends  REASON FOR ASSESSMENT:   Consult Assessment of nutrition requirement/status  ASSESSMENT:   Pt admitted with acute pulmonary edema secondary to volume overload from non-adherence to dialysis, acute hypoxic and hypercarbic respiratory failure, and abdominal pain of unknown etiology. Per Nephrology MD note pt missed last 3 oupatient HD sessions.  Abdominal pain possibly secondary to diabetic gastroparesis per MD note. Pt currently intubated.  Pt extubated on 01/12/16; diet advanced to Dysphagia I today. Pt with periods of agitation (code 300 called) and pt currently on precedex drip (weaning at present). Pt with 1:1 sitter, working with PT on visit today.  Last HD received on 01/13/16  Diet Order:  DIET - DYS 1 Room service appropriate?: Yes with Assist; Fluid consistency:: Thin  Skin:  Reviewed, no issues  Last BM:  no BM documented   Labs: reviewed  Meds: reviewed  Height:   Ht Readings from Last 1 Encounters:  01/10/16 5\' 3"  (1.6 m)    Weight:   Wt Readings from Last 1 Encounters:  01/14/16 183 lb 10.3 oz (83.3 kg)    Ideal Body Weight:     BMI:  Body mass index is 32.54 kg/(m^2).  Estimated Nutritional Needs:   Kcal:  2075-2300 kals  Protein:  100-116 g   Fluid:  UOP+1L  EDUCATION NEEDS:   Education needs no appropriate at this time  Kerman Passey [place], [place], [place] [phone] Pager  [phone] Weekend/On-Call Pager

## 2016-01-14 NOTE — Progress Notes (Signed)
Informed Dr. Candiss Norse that patient was bladder scanned and had 218 ml in bladder. MD stated that was good. Continue to monitor.

## 2016-01-14 NOTE — Evaluation (Signed)
Clinical/Bedside Swallow Evaluation Patient Details  Name: Talanda Ferriell MRN: [id number] Date of Birth: 12-16-1960  Today's Date: 01/14/2016 Time: SLP Start Time (ACUTE ONLY): 0750 SLP Stop Time (ACUTE ONLY): 0850 SLP Time Calculation (min) (ACUTE ONLY): 60 min  Past Medical History:  Past Medical History  Diagnosis Date  . ESRD (end stage renal disease) on dialysis ([place])   . Hypertension   . Polycystic kidney disease   . Asthma   . COPD (chronic obstructive pulmonary disease) ([place])   . Diabetes mellitus without complication ([place])   . Neuropathy ([place])   . Renal insufficiency    Past Surgical History:  Past Surgical History  Procedure Laterality Date  . Arteriovenous graft placement Right     x3 (R forearm currently used for access)  . Vein harvest    . Colon surgery     HPI:  Pt is a 55 year old African-American female with a past medical history of end-stage renal disease on hemodialysis, type 2 diabetes, hypertension, polycystic kidney disease, asthma, COPD, and diabetic neuropathy who presented to the ED with complaints of abdominal pain, nausea, vomiting and migraine headaches that started today. History is obtained from EMS and ED records as patient is currently intubated. Per ED records, patient stated that she skipped dialysis today because she was in so much pain. Described pain as mostly located around the central region of her abdomen, severe, constant, and has gotten worse over the last 8 to 9 days. She has a ventral hernia and has had abdominal surgery before. She did not relay any relieving or aggravating factors. She reported 3 bowel movements prior to ED arrival. She reports having dialysis on Wednesday. She is also complaining of dyspnea that is mildly worse with exertion. At the ED, patient became very anxious, and her respirations became labored with worsening shortness of breath. Her chest x-ray showed pulmonary edema and when she was placed on nasal cannula, patient kept  complaining that she could not breath hence the ED MD intubated patient emergently and PCCM was consulted for admission. Patient is now intubated and sedated. Heart blood pressure is still elevated with systolic in the 0000000. Pt was intubated ~2 days. She is extubated now w/ vocal quality c/b hoarseness and low volume(almost a whisper it seemed). Pt continues to be agitated and receives Haldol and Ativan to calm behaviors. Pt has remained NPO and could not be assessed yesterday pm d/t lethargy post medications.    Assessment / Plan / Recommendation Clinical Impression  This was a limited assessment sec. to pt's declined Cognitive status and desire to participate in the eval; pt was easily distracted and required time to redirect to task intermittently. Pt accepted jsut a few trials total w/ no overt s/s of aspiration noted during/post intake; no decline in O2 sats or change in the reduced vocal quality(remained at its baseline). Oral phase appeared grossly wfl for bolus management of the trials; pt transfered and cleared appropriately. However, pt declined any further trials despite encouragement from SLP and [place] staff. Pt continues to remain at an increased risk for aspiration sec. to declined Cognitive status and lethargy at times d/t sedating medications. Recommend initiation of modified diet to puree w/ thin liquids w/ strict aspiration precautions; meds in puree - crushed as able. Feeding assistance and monitoring to give pt po's only when fully awake/alert. NSG updated.     Aspiration Risk  Mild aspiration risk    Diet Recommendation  Dysphagia 1 w/ thin liquids; strict aspiration precautions; feeding  assistance at meals w/ monitoring  Medication Administration: Crushed with puree    Other  Recommendations Recommended Consults:  (dietician) Oral Care Recommendations: Oral care BID;Staff/trained caregiver to provide oral care   Follow up Recommendations   (TBD)    Frequency and Duration min 3x  week  2 weeks       Prognosis Prognosis for Safe Diet Advancement: Fair Barriers to Reach Goals: Cognitive deficits;Behavior;Medication      Swallow Study   General Date of Onset: 01/10/16 HPI: Pt is a 55 year old African-American female with a past medical history of end-stage renal disease on hemodialysis, type 2 diabetes, hypertension, polycystic kidney disease, asthma, COPD, and diabetic neuropathy who presented to the ED with complaints of abdominal pain, nausea, vomiting and migraine headaches that started today. History is obtained from EMS and ED records as patient is currently intubated. Per ED records, patient stated that she skipped dialysis today because she was in so much pain. Described pain as mostly located around the central region of her abdomen, severe, constant, and has gotten worse over the last 8 to 9 days. She has a ventral hernia and has had abdominal surgery before. She did not relay any relieving or aggravating factors. She reported 3 bowel movements prior to ED arrival. She reports having dialysis on Wednesday. She is also complaining of dyspnea that is mildly worse with exertion. At the ED, patient became very anxious, and her respirations became labored with worsening shortness of breath. Her chest x-ray showed pulmonary edema and when she was placed on nasal cannula, patient kept complaining that she could not breath hence the ED MD intubated patient emergently and PCCM was consulted for admission. Patient is now intubated and sedated. Heart blood pressure is still elevated with systolic in the 0000000. Pt was intubated ~2 days. She is extubated now w/ vocal quality c/b hoarseness and low volume(almost a whisper it seemed). Pt continues to be agitated and receives Haldol and Ativan to calm behaviors. Pt has remained NPO and could not be assessed yesterday pm d/t lethargy post medications.  Type of Study: Bedside Swallow Evaluation Previous Swallow Assessment: none Diet Prior  to this Study: NPO (since admission) Temperature Spikes Noted: No (wbc 9.22f) Respiratory Status: Nasal cannula (2 liters) History of Recent Intubation: Yes Length of Intubations (days): 2 days Date extubated: 01/12/16 Behavior/Cognition: Pleasant mood;Confused;Distractible;Requires cueing (awake; declined po trials despite encouragement) Oral Cavity Assessment:  (could not assess fully d/t declined Cognitive status) Oral Care Completed by SLP: Recent completion by staff Oral Cavity - Dentition: Missing dentition Vision:  (n/a) Self-Feeding Abilities: Total assist Patient Positioning: Postural control adequate for testing Baseline Vocal Quality: Low vocal intensity;Hoarse Volitional Cough: Cognitively unable to elicit Volitional Swallow: Unable to elicit    Oral/Motor/Sensory Function Overall Oral Motor/Sensory Function:  (pt did not follow instruction for assessment to be made)   Ice Chips Ice chips: Within functional limits Presentation: Spoon (2 trials) Other Comments: adequate management noted   Thin Liquid Thin Liquid: Within functional limits Presentation: Straw (fed; 1 trial) Other Comments: declined further trials    Nectar Thick Nectar Thick Liquid: Not tested   Honey Thick Honey Thick Liquid: Not tested   Puree Puree: Within functional limits Presentation: Spoon (fed; 1 trial)   Solid   GO   Solid: Not tested       Orinda Kenner, MS, CCC-SLP  Wilhel[place]ia Addis 01/14/2016,10:05 AM

## 2016-01-14 NOTE — Care Management (Signed)
Patient has had 2 admissions in the last 6 months and 9 ED visits.  She may qualify for [facility] follow up.  She is currently on Precedex drip and safety sitter for severe agitation.  [place] from [place].  PT consult pending

## 2016-01-14 NOTE — Progress Notes (Signed)
[facility] NAME: Jasmine Holloway    MR#:  [id number]  DATE OF BIRTH:  10-28-1960  SUBJECTIVE:  CHIEF COMPLAINT:   Chief Complaint  Patient presents with  . Abdominal Pain   - remains on precedex drip, more calm today, some confusion, no agitation -Extubated - currently on 2L nasal cannula  REVIEW OF SYSTEMS:  Review of Systems  Unable to perform ROS: mental acuity    DRUG ALLERGIES:   Allergies  Allergen Reactions  . Morphine And Related Nausea And Vomiting  . Vicodin [Hydrocodone-Acetaminophen] Nausea And Vomiting  . Buprenorphine Hcl Nausea And Vomiting  . Codeine Nausea And Vomiting    VITALS:  Blood pressure 147/60, pulse 72, temperature 98.7 F (37.1 C), temperature source Oral, resp. rate 20, height 5\' 3"  (1.6 m), weight 83.3 kg (183 lb 10.3 oz), SpO2 100 %.  PHYSICAL EXAMINATION:  Physical Exam  GENERAL:  55 y.o.-year-old Disheveled appearing patient lying in the bed with no acute distress.  EYES: Pupils equal, round, reactive to light and accommodation. No scleral icterus. Extraocular muscles intact.  HEENT: Head atraumatic, normocephalic. Oropharynx and nasopharynx clear.  NECK:  Supple, no jugular venous distention. No thyroid enlargement, no tenderness.  LUNGS: Normal breath sounds bilaterally, no wheezing, rales,rhonchi or crepitation. No use of accessory muscles of respiration. Bibasilar crackles noted CARDIOVASCULAR: S1, S2 normal. No murmurs, rubs, or gallops.  ABDOMEN: Soft, nontender, nondistended. Bowel sounds present. No organomegaly or mass.  EXTREMITIES: No pedal edema, cyanosis, or clubbing. Right forearm AV fistula NEUROLOGIC: Cranial nerves II through XII are intact. Following commands. Global weakness noted. Muscle strength 5/5 in all extremities. Sensation intact. Gait not checked.  PSYCHIATRIC: The patient is alert and oriented x 1-2.  SKIN: No obvious rash, lesion, or ulcer.     LABORATORY PANEL:   CBC  Recent Labs Lab 01/14/16 0434  WBC 9.4  HGB 9.1*  HCT 27.6*  PLT 249   ------------------------------------------------------------------------------------------------------------------  Chemistries   Recent Labs Lab 01/11/16 0950  01/13/16 0442 01/14/16 0434  NA 139  < > 141 142  K 5.6*  < > 4.3 4.2  CL 102  < > 101 103  CO2 21*  < > 30 28  GLUCOSE 122*  < > 96 81  BUN 72*  < > 23* 20  CREATININE 12.40*  < > 6.13* 5.58*  CALCIUM 8.4*  < > 8.4* 8.7*  MG  --   < > 2.1  --   AST 31  --   --   --   ALT 37  --   --   --   ALKPHOS 53  --   --   --   BILITOT 0.4  --   --   --   < > = values in this interval not displayed. ------------------------------------------------------------------------------------------------------------------  Cardiac Enzymes  Recent Labs Lab 01/11/16 0950  TROPONINI 0.05*   ------------------------------------------------------------------------------------------------------------------  RADIOLOGY:  Dg Chest Port 1 View  01/13/2016  CLINICAL DATA:  Acute respiratory failure, history of asthma -COPD, end-stage renal disease, diabetes. EXAM: PORTABLE CHEST 1 VIEW COMPARISON:  Portable chest x-ray of January 12, 2016 FINDINGS: The lungs are adequately inflated. The interstitial markings remain increased bilaterally. There is no significant pleural effusion. The cardiac silhouette remains enlarged. The retrocardiac density on the left remains increased. The trachea has been extubated as has the esophagus. The left internal jugular venous catheter tip projects over the junction of the right and left  brachiocephalic veins. There surgical clips in the left axillary region. IMPRESSION: Interval extubation of the trachea. The lungs remain well-expanded. There is stable cardiomegaly. Mild pulmonary interstitial edema is present. Left lower lobe atelectasis or pneumonia is suspected. Electronically Signed   By: David  Martinique M.D.    On: 01/13/2016 07:25    EKG:   Orders placed or performed during the hospital encounter of 01/10/16  . ED EKG  . ED EKG  . EKG 12-Lead  . EKG 12-Lead  . EKG 12-Lead  . EKG 12-Lead    ASSESSMENT AND PLAN:   55 year old female with past medical history significant for end-stage renal disease on and if Wednesday Friday hemodialysis, diabetes, hypertension, polycystic kidney disease, COPD and neuropathy presents to hospital secondary to nausea vomiting and respiratory failure.  * Acute hypoxic respiratory failure due to acute on chronic diastolic CHF Improved. Now extubated. Continues to be on oxygen. 2 L Secondary to acute pulmonary edema and fluid overload. -Improved after dialysis Wean oxygen as tolerated.  * Acute encephalopathy CT scan of the head showed nothing acute. Could be ICU delirium. Also according to husband. Patient was having some confusion on and off 3 weeks prior to admission. Improving with Precedex drip Monitor closely Will need neurology consult if no improvement.  * Anemia of chronic disease Monitor. epogen with dialysis.  * Accelerated hypertension On oral medications. Continue Norvasc, Lasix, metoprolol  * Diabetes mellitus Sliding scale insulin  *DVT prophylaxis-on subcutaneous heparin   Physical therapy once out of ICU.    All the records are reviewed and case discussed with Care Management/Social Workerr. Management plans discussed with the patient, family and they are in agreement.  CODE STATUS: Full code  TOTAL TIME TAKING CARE OF THIS PATIENT: 37 minutes.   POSSIBLE D/C IN 2-3 DAYS, DEPENDING ON CLINICAL CONDITION.   Gladstone Lighter M.D on 01/14/2016 at 2:59 PM  Between 7am to 6pm - Pager - [phone]  After 6pm go to [email or web address] - password [email or web address] [facility]  Office  [phone]  CC: Primary care physician; Elyse Jarvis, MD

## 2016-01-14 NOTE — Evaluation (Signed)
Physical Therapy Evaluation Patient Details Name: Jasmine Holloway MRN: [id number] DOB: 10-16-1960 Today's Date: 01/14/2016   History of Present Illness  55 yo F presented to ED on 6/2 for abdominal pain, SOB, and AMS found to have acute respiratory failure with hypoxia. She was intubated after admission and extubated on 6/4, now on 2L O2. Pt has ESRD and is on dialysis. PMH includes polycystic kidney disease, COPD, DM, neuropathy, R AV graft. She has been highly agitated since admission and treated with medication and has a Engineer, materials.  Clinical Impression  Pt demonstrated generalized weakness and difficulty walking due to extended hospitalization. She requires min A for bed mobility. Transfers and ambulation up to 80 ft with FWW require min A +2 for balance and safety/equipment. Pt is groggy and cognition is such that she had difficulty providing information on PLOF and home environment. She is impulsive and requires +2 assistance for mobility to maintain safety. SpO2 100% on 2L and HR in the 80s. STR is recommended after hospital discharge to address deficits of strength, balance, endurance, transfers and gait to progress towards PLOF prior to returning home. Pt will benefit from skilled PT services to increase functional I and mobility for safe discharge.     Follow Up Recommendations SNF    Equipment Recommendations  Rolling walker with 5" wheels    Recommendations for Other Services       Precautions / Restrictions Precautions Precautions: Fall Restrictions Weight Bearing Restrictions: No      Mobility  Bed Mobility Overal bed mobility: Needs Assistance Bed Mobility: Supine to Sit;Sit to Supine     Supine to sit: Min assist;HOB elevated Sit to supine: Min assist;HOB elevated   General bed mobility comments: increased time required, cues for technique  Transfers Overall transfer level: Needs assistance Equipment used: Rolling walker (2 wheeled) Transfers: Sit to/from  [facility] Sit to Stand: Min assist;+2 safety/equipment;From elevated surface Stand pivot transfers: Min assist;+2 safety/equipment;From elevated surface       General transfer comment: cues for hand placement and safe technique, pt impulsive  Ambulation/Gait Ambulation/Gait assistance: Min assist;+2 safety/equipment;+2 physical assistance Ambulation Distance (Feet): 80 Feet Assistive device: Rolling walker (2 wheeled) Gait Pattern/deviations: Step-to pattern;Decreased stride length;Shuffle;Trunk flexed Gait velocity: reduced Gait velocity interpretation: Below normal speed for age/gender General Gait Details: unsteady, slow gait, frequent cues for postural correction and staying inside FWW  Stairs            Wheelchair Mobility    Modified Rankin (Stroke Patients Only)       Balance Overall balance assessment: Needs assistance Sitting-balance support: Feet supported;Bilateral upper extremity supported Sitting balance-Leahy Scale: Fair     Standing balance support: Bilateral upper extremity supported Standing balance-Leahy Scale: Poor Standing balance comment: requires min A to maintain balance                             Pertinent Vitals/Pain Pain Assessment: No/denies pain    Home Living Family/patient expects to be discharged to:: Private residence Living Arrangements: Children;Other relatives Available Help at Discharge: Family             Additional Comments: Unable to get home environment/set up due to pt's cognitive status.    Prior Function Level of Independence: Independent with assistive device(s)         Comments: Pt reports she ambulated with a SPC, but this information is unclear of accuracy due to pt's cognitive status.  Hand Dominance        Extremity/Trunk Assessment   Upper Extremity Assessment: Generalized weakness           Lower Extremity Assessment: Generalized weakness (grossly 3+/5)          Communication   Communication: Other (comment) (soft spoken, hard to understand)  Cognition Arousal/Alertness: Lethargic;Suspect due to medications Behavior During Therapy: Impulsive;Agitated Overall Cognitive Status: Impaired/Different from baseline Area of Impairment: Orientation;Attention;Memory;Following commands;Safety/judgement;Awareness Orientation Level: Time;Situation Current Attention Level: Selective   Following Commands: Follows one step commands consistently Safety/Judgement: Decreased awareness of safety     General Comments: Session limited due to cognition and drowsiness from medication    General Comments General comments (skin integrity, edema, etc.): UE mits donned due to agitation, soft spoken and hard to understand, decreased cognition     Exercises Other Exercises Other Exercises: Pt ambulated 80 ft with FWW and min A +2 for safety/equipment and to maintain balance. Pt tended to push FWW far away infront and shuffle her feet. Pt unsteady with moderate anterior weight shift. Cues for postural correction, hand placement on FWW and staying inside FWW for improved stability.       Assessment/Plan    PT Assessment Patient needs continued PT services  PT Diagnosis Difficulty walking;Generalized weakness   PT Problem List Decreased strength;Decreased activity tolerance;Decreased balance;Decreased mobility;Decreased cognition;Decreased knowledge of use of DME;Decreased safety awareness  PT Treatment Interventions DME instruction;Gait training;Stair training;Therapeutic exercise;Therapeutic activities;Balance training;Neuromuscular re-education;Patient/family education   PT Goals (Current goals can be found in the Care Plan section) Acute Rehab PT Goals Patient Stated Goal: "I don't belong here" PT Goal Formulation: With patient Time For Goal Achievement: 01/28/16 Potential to Achieve Goals: Fair    Frequency Min 2X/week   Barriers to discharge  Inaccessible home environment;Decreased caregiver support unsure of home environment specifics at this time, pt will need 24 hour care at this time    Co-evaluation               End of Session Equipment Utilized During Treatment: Gait belt;Oxygen Activity Tolerance: Patient limited by lethargy Patient left: in bed;with call bell/phone within reach;with restraints reapplied;with nursing/sitter in room;with SCD's reapplied Nurse Communication: Mobility status         Time: 1040-1105 PT Time Calculation (min) (ACUTE ONLY): 25 min   Charges:   PT Evaluation $PT Eval Moderate Complexity: 1 Procedure PT Treatments $Gait Training: 8-22 mins   PT G Codes:        Neoma Laming, PT, DPT  01/14/2016, 11:28 AM [phone]

## 2016-01-14 NOTE — Progress Notes (Signed)
Informed NP that pt is off Precedex gtt as of 1516 PM and remain calm and relaxed. Continues to have 1:1 sitter at the bedside. Pt alert and oriented and asking questions, talking about family and makes needs known. If continues to remain calm and stable, pt may be transferred to Telemetry.

## 2016-01-14 NOTE — Progress Notes (Signed)
PULMONARY / CRITICAL CARE MEDICINE   Name: Jasmine Holloway MRN: [id number] DOB: [birth year]    ADMISSION DATE:  01/10/2016   DISCUSSION: 55 year old African-American female presenting with acute pulmonary edema secondary to volume overload from non-adherence to dialysis, acute hypoxic and hypercarbic respiratory failure, and abdominal pain of unknown etiology. Abdominal pain likely due to diabetic gastroparesis than to any infectious process.   ASSESSMENT / PLAN:  PULMONARY A: Acute hypoxic/hypercarbic respiratory failure S/P extubation 06/04  History of COPD/asthma Pulmonary edema P:   -Supplemental O2 -Nebulized steroids and bronchodilators  CARDIOVASCULAR A:  Hypertension Volume overload secondary to noncompliance with dialysis P:  -Hemodynamics per ICU protocol -Hydralazine 10 mg IV every 4 hours and labetalol Q2H when necessary for systolic blood pressure greater than 140 -Dialysis and diuresis per nephrology  RENAL A:   End-stage renal disease on dialysis Polycystic kidney disease Severe hypokalemia-potassium down to 4.3 post dialysis; HD 6/3 AND 6/4 P:   -Hemodialysis per nephrology -Monitor and correct electrolyte imbalances   GASTROINTESTINAL A:   Abdominal pain-unclear etiology;  rule out diabetic gastroparesis-abdominal ultrasound does not show any acute changes except for questionable cholelithiasis History of ventral hernia P:   -Monitor abdominal pain post extubation -PPI stress ulcer prophylaxis -Reglan 5 mg every 6 hours when necessary for nausea and vomiting  HEMATOLOGIC A:   Chronic anemia-hemoglobin 8.7 s/p transfusion P:  -Trend CBC. -Transfused 1 unit of packed red blood cells 06/04 -Heparin for VTE prophylaxis  INFECTIOUS A:   No acute issues-pro-calcitonin mildly elevated P:   -Follow-up cultures -No antibiotics for now  CULTURES: Blood cultures 2. MRSA screen negative. Urine culture>>mutlispecies, recollect Urine culture  6/6>>  ANTIBIOTICS: None  ENDOCRINE A:   Type 2 diabetes mellitus P:   -Blood glucose monitoring with sliding scale insulin coverage  NEUROLOGIC A:   Altered mental status-improved Acute agitation and anxiety- unclear etiology P:   RASS goal: 0  -Precedex gtt, wean for rass goal of 0 - prn haldol and ativan   STUDIES:  Ultrasound abdomen 01/11/2016-done see report   SIGNIFICANT EVENTS: 01/10/2016 ED with abdominal pain, became agitated, intubated for airway protection  LINES/TUBES: Peripheral IVs Dialysis shunt  --------------------------------  SUBJECTIVE: Extubated; agitation is getting better, tolerated HD yesterday, had PT this AM.   VITAL SIGNS: BP 174/82 mmHg  Pulse 76  Temp(Src) 98.7 F (37.1 C) (Oral)  Resp 22  Ht 5\' 3"  (1.6 m)  Wt 183 lb 10.3 oz (83.3 kg)  BMI 32.54 kg/m2  SpO2 100%  HEMODYNAMICS:    VENTILATOR SETTINGS:    INTAKE / OUTPUT: I/O last 3 completed shifts: In: 917.9 [I.V.:867.9; IV Piggyback:50] Out: [id number] [Urine:550; Other:2000]  PHYSICAL EXAMINATION: General: Chronically ill-looking, no distress Neuro:  Awakens to voice and noxious stimulus, moves all extremities to pain; easily gets agitated - improving HEENT: PEERLA, conjunctivae pink, trachea midline Cardiovascular:  Rate and rhythm regular, S1, S2, no murmur, regurg or gallop Lungs: Clear to auscultation bilaterally without wheezes or rhonchi Abdomen: No palpable organomegaly, normal bowel sounds Musculoskeletal:  Positive range of motion in upper and lower extremities Extremities: +2 pulses, trace edema Skin:  Warm and dry  LABS:  BMET  Recent Labs Lab 01/12/16 0542 01/13/16 0442 01/14/16 0434  NA 138 141 142  K 4.2 4.3 4.2  CL 99* 101 103  CO2 29 30 28   BUN 41* 23* 20  CREATININE 8.63* 6.13* 5.58*  GLUCOSE 119* 96 81    Electrolytes  Recent Labs Lab 01/11/16 0331  01/12/16 0542  01/13/16 0442 01/14/16 0434  CALCIUM 7.8*  < > 8.0* 8.4* 8.7*  MG  2.2  --  2.0 2.1  --   PHOS 8.6*  --  6.9* 6.6*  --   < > = values in this interval not displayed.  CBC  Recent Labs Lab 01/12/16 0542 01/12/16 0815 01/13/16 0442 01/14/16 0434  WBC 7.5  --  9.9 9.4  HGB 7.3* 7.2* 8.7* 9.1*  HCT 22.0*  --  27.1* 27.6*  PLT 242  --  245 249    Coag's No results for input(s): APTT, INR in the last 168 hours.  Sepsis Markers  Recent Labs Lab 01/10/16 2107 01/10/16 2139  LATICACIDVEN  --  0.8  PROCALCITON 1.76  --     ABG  Recent Labs Lab 01/11/16 0400 01/12/16 0450 01/12/16 0848  PHART 7.33* 7.50* 7.40  PCO2ART 37 45 54*  PO2ART 97 67* 76*    Liver Enzymes  Recent Labs Lab 01/08/16 1723 01/10/16 1308 01/11/16 0950  AST 17 24 31   ALT 14 20 37  ALKPHOS 51 58 53  BILITOT 0.5 0.7 0.4  ALBUMIN 3.7 3.6 3.6    Cardiac Enzymes  Recent Labs Lab 01/10/16 2107 01/11/16 0331 01/11/16 0950  TROPONINI 0.08* 0.07* 0.05*    Glucose  Recent Labs Lab 01/14/16 0015 01/14/16 0213 01/14/16 0419 01/14/16 0606 01/14/16 0722 01/14/16 1125  GLUCAP 73 79 77 78 77 75    Imaging No results found.  I have personally obtained a history, examined the patient, evaluated laboratory and imaging results, formulated the assessment and plan and placed orders. CRITICAL CARE: The patient is critically ill with multiple organ systems failure and requires high complexity decision making for assessment and support, frequent evaluation and titration of therapies, application of advanced monitoring technologies and extensive interpretation of multiple databases. Critical Care Time devoted to patient care services described in this note is 30 minutes.    Vilinda Boehringer, MD [facility] Pager [phone] (please enter 7-digits) On Call Pager - [phone] (please enter 7-digits)     01/14/2016, 1:25 PM

## 2016-01-15 ENCOUNTER — Inpatient Hospital Stay: Payer: Medicare Other

## 2016-01-15 DIAGNOSIS — I10 Essential (primary) hypertension: Secondary | ICD-10-CM

## 2016-01-15 DIAGNOSIS — R079 Chest pain, unspecified: Secondary | ICD-10-CM | POA: Diagnosis not present

## 2016-01-15 DIAGNOSIS — E8779 Other fluid overload: Secondary | ICD-10-CM

## 2016-01-15 LAB — BLOOD CULTURE ID PANEL (REFLEXED)
Acinetobacter baumannii: NOT DETECTED
CANDIDA ALBICANS: NOT DETECTED
CANDIDA GLABRATA: NOT DETECTED
CANDIDA PARAPSILOSIS: NOT DETECTED
CANDIDA TROPICALIS: NOT DETECTED
Candida krusei: NOT DETECTED
Carbapenem resistance: NOT DETECTED
ENTEROBACTER CLOACAE COMPLEX: NOT DETECTED
ENTEROBACTERIACEAE SPECIES: NOT DETECTED
ESCHERICHIA COLI: NOT DETECTED
Enterococcus species: NOT DETECTED
Haemophilus influenzae: NOT DETECTED
KLEBSIELLA PNEUMONIAE: NOT DETECTED
Klebsiella oxytoca: NOT DETECTED
Listeria monocytogenes: NOT DETECTED
METHICILLIN RESISTANCE: NOT DETECTED
Neisseria meningitidis: NOT DETECTED
PROTEUS SPECIES: NOT DETECTED
PSEUDOMONAS AERUGINOSA: NOT DETECTED
STAPHYLOCOCCUS AUREUS BCID: NOT DETECTED
STREPTOCOCCUS AGALACTIAE: NOT DETECTED
STREPTOCOCCUS PNEUMONIAE: NOT DETECTED
Serratia marcescens: NOT DETECTED
Staphylococcus species: NOT DETECTED
Streptococcus pyogenes: NOT DETECTED
Streptococcus species: NOT DETECTED
Vancomycin resistance: NOT DETECTED

## 2016-01-15 LAB — GLUCOSE, CAPILLARY
GLUCOSE-CAPILLARY: 84 mg/dL (ref 65–99)
GLUCOSE-CAPILLARY: 97 mg/dL (ref 65–99)
GLUCOSE-CAPILLARY: 88 mg/dL (ref 65–99)
Glucose-Capillary: 108 mg/dL — ABNORMAL HIGH (ref 65–99)
Glucose-Capillary: 123 mg/dL — ABNORMAL HIGH (ref 65–99)
Glucose-Capillary: 101 mg/dL — ABNORMAL HIGH (ref 65–99)
Glucose-Capillary: 88 mg/dL (ref 65–99)

## 2016-01-15 LAB — BASIC METABOLIC PANEL
Anion gap: 14 (ref 5–15)
BUN: 41 mg/dL — AB (ref 6–20)
CHLORIDE: 99 mmol/L — AB (ref 101–111)
CO2: 27 mmol/L (ref 22–32)
CREATININE: 8.12 mg/dL — AB (ref 0.44–1.00)
Calcium: 9.2 mg/dL (ref 8.9–10.3)
GFR calc Af Amer: 6 mL/min — ABNORMAL LOW (ref >60)
GFR calc non Af Amer: 5 mL/min — ABNORMAL LOW (ref >60)
GLUCOSE: 98 mg/dL (ref 65–99)
POTASSIUM: 3.8 mmol/L (ref 3.5–5.1)
SODIUM: 140 mmol/L (ref 135–145)

## 2016-01-15 LAB — PROCALCITONIN: Procalcitonin: 6.17 ng/mL

## 2016-01-15 LAB — TROPONIN I: Troponin I: 0.07 ng/mL — ABNORMAL HIGH (ref <0.031)

## 2016-01-15 MED ORDER — METOPROLOL TARTRATE 5 MG/5ML IV SOLN
10.0000 mg | Freq: Once | INTRAVENOUS | Status: AC
  Administered 2016-01-15: 10 mg via INTRAVENOUS
  Filled 2016-01-15: qty 10

## 2016-01-15 MED ORDER — CALCIUM ACETATE (PHOS BINDER) 667 MG PO CAPS
1334.0000 mg | ORAL_CAPSULE | Freq: Three times a day (TID) | ORAL | Status: DC
  Administered 2016-01-15 – 2016-01-17 (×6): 1334 mg via ORAL
  Filled 2016-01-15 (×6): qty 2

## 2016-01-15 MED ORDER — FAMOTIDINE 20 MG PO TABS
10.0000 mg | ORAL_TABLET | Freq: Every day | ORAL | Status: DC
  Administered 2016-01-15 – 2016-01-16 (×2): 10 mg via ORAL
  Filled 2016-01-15 (×2): qty 1

## 2016-01-15 MED ORDER — LABETALOL HCL 5 MG/ML IV SOLN
20.0000 mg | INTRAVENOUS | Status: DC | PRN
  Administered 2016-01-15 – 2016-01-16 (×5): 20 mg via INTRAVENOUS
  Filled 2016-01-15 (×5): qty 4

## 2016-01-15 MED ORDER — HYDRALAZINE HCL 20 MG/ML IJ SOLN
20.0000 mg | INTRAMUSCULAR | Status: DC | PRN
Start: 2016-01-15 — End: 2016-01-17
  Administered 2016-01-15 – 2016-01-17 (×6): 20 mg via INTRAVENOUS
  Filled 2016-01-15 (×6): qty 1

## 2016-01-15 MED ORDER — HYDROMORPHONE HCL 1 MG/ML PO LIQD: 1.0000 mg | Freq: Four times a day (QID) | ORAL | Status: DC | PRN

## 2016-01-15 MED ORDER — ASPIRIN 81 MG PO CHEW
81.0000 mg | CHEWABLE_TABLET | Freq: Every day | ORAL | Status: DC
  Administered 2016-01-15 – 2016-01-16 (×2): 81 mg via ORAL
  Filled 2016-01-15 (×2): qty 1

## 2016-01-15 MED ORDER — CINACALCET HCL 30 MG PO TABS
30.0000 mg | ORAL_TABLET | Freq: Every day | ORAL | Status: DC
  Administered 2016-01-16: 30 mg via ORAL
  Filled 2016-01-15: qty 1

## 2016-01-15 MED ORDER — NITROGLYCERIN 0.4 MG SL SUBL
0.4000 mg | SUBLINGUAL_TABLET | SUBLINGUAL | Status: AC | PRN
Start: 2016-01-15 — End: 2016-01-15
  Administered 2016-01-15 (×3): 0.4 mg via SUBLINGUAL
  Filled 2016-01-15: qty 1

## 2016-01-15 MED ORDER — HYDROMORPHONE HCL 1 MG/ML IJ SOLN: 1.0000 mg | Freq: Four times a day (QID) | INTRAMUSCULAR | Status: DC | PRN

## 2016-01-15 MED ORDER — PHENOL 1.4 % MT LIQD
1.0000 | OROMUCOSAL | Status: DC | PRN
  Administered 2016-01-17: 1 via OROMUCOSAL
  Filled 2016-01-15 (×2): qty 177

## 2016-01-15 NOTE — Progress Notes (Signed)
Tx terminated

## 2016-01-15 NOTE — Progress Notes (Signed)
BP remains elevated.  PRN medications administered with minimal effect.  C/O CP around 2045 but resolved before intervention.  Continues to deny CP.  Troponin elevated.  E-Link notified.  Awaiting any new orders.  Will continue to monitor.

## 2016-01-15 NOTE — Progress Notes (Signed)
Pre Dialysis 

## 2016-01-15 NOTE — Progress Notes (Signed)
[facility] [place] NAME: Omnia Mote    MR#:  [id number]  DATE OF BIRTH:  1960-09-05  SUBJECTIVE:  CHIEF COMPLAINT:   Chief Complaint  Patient presents with  . Abdominal Pain   - Off Precedex drip. More alert and very cooperative today. -For dialysis today. Blood pressure is elevated. -Complaints of throat pain secondary to recent intubation  REVIEW OF SYSTEMS:  Review of Systems  Constitutional: Negative for fever, chills and malaise/fatigue.  HENT: Positive for sore throat. Negative for ear discharge, ear pain and tinnitus.   Eyes: Negative for blurred vision and double vision.  Respiratory: Negative for cough, shortness of breath and wheezing.   Cardiovascular: Negative for chest pain, palpitations and leg swelling.  Gastrointestinal: Negative for nausea, vomiting, abdominal pain, diarrhea and constipation.  Genitourinary: Negative for dysuria and urgency.  Musculoskeletal: Negative for myalgias and neck pain.  Neurological: Negative for dizziness, sensory change, speech change, focal weakness, seizures and headaches.  Psychiatric/Behavioral: Negative for depression.    DRUG ALLERGIES:   Allergies  Allergen Reactions  . Morphine And Related Nausea And Vomiting  . Vicodin [Hydrocodone-Acetaminophen] Nausea And Vomiting  . Buprenorphine Hcl Nausea And Vomiting  . Codeine Nausea And Vomiting    VITALS:  Blood pressure 183/72, pulse 87, temperature 98.3 F (36.8 C), temperature source Oral, resp. rate 17, height 5\' 3"  (1.6 m), weight 84.6 kg (186 lb 8.2 oz), SpO2 100 %.  PHYSICAL EXAMINATION:  Physical Exam  GENERAL:  55 y.o.-year-old Disheveled appearing patient lying in the bed with no acute distress.  EYES: Pupils equal, round, reactive to light and accommodation. No scleral icterus. Extraocular muscles intact.  HEENT: Head atraumatic, normocephalic. Oropharynx and nasopharynx clear.  NECK:  Supple, no jugular  venous distention. No thyroid enlargement, no tenderness.  LUNGS: Normal breath sounds bilaterally, no wheezing, rales,rhonchi or crepitation. No use of accessory muscles of respiration. Bibasilar crackles noted CARDIOVASCULAR: S1, S2 normal. No murmurs, rubs, or gallops.  ABDOMEN: Soft, nontender, nondistended. Bowel sounds present. No organomegaly or mass.  EXTREMITIES: No pedal edema, cyanosis, or clubbing. Right forearm AV fistula NEUROLOGIC: Cranial nerves II through XII are intact. Following commands. Global weakness noted. Muscle strength 5/5 in all extremities. Sensation intact. Gait not checked.  PSYCHIATRIC: The patient is alert and oriented x 2-3.  SKIN: No obvious rash, lesion, or ulcer.    LABORATORY PANEL:   CBC  Recent Labs Lab 01/14/16 0434  WBC 9.4  HGB 9.1*  HCT 27.6*  PLT 249   ------------------------------------------------------------------------------------------------------------------  Chemistries   Recent Labs Lab 01/11/16 0950  01/13/16 0442  01/15/16 0503  NA 139  < > 141  < > 140  K 5.6*  < > 4.3  < > 3.8  CL 102  < > 101  < > 99*  CO2 21*  < > 30  < > 27  GLUCOSE 122*  < > 96  < > 98  BUN 72*  < > 23*  < > 41*  CREATININE 12.40*  < > 6.13*  < > 8.12*  CALCIUM 8.4*  < > 8.4*  < > 9.2  MG  --   < > 2.1  --   --   AST 31  --   --   --   --   ALT 37  --   --   --   --   ALKPHOS 53  --   --   --   --  BILITOT 0.4  --   --   --   --   < > = values in this interval not displayed. ------------------------------------------------------------------------------------------------------------------  Cardiac Enzymes  Recent Labs Lab 01/11/16 0950  TROPONINI 0.05*   ------------------------------------------------------------------------------------------------------------------  RADIOLOGY:  Dg Chest Port 1 View  01/15/2016  CLINICAL DATA:  Acute respiratory failure EXAM: PORTABLE CHEST 1 VIEW COMPARISON:  01/13/2016 FINDINGS: There is a left  IJ catheter with tip in the projection of the SVC. There is moderate cardiac enlargement. Pulmonary vascular congestion is identified. No pleural effusion noted. IMPRESSION: 1. Cardiac enlargement and pulmonary vascular congestion. Electronically Signed   By: Kerby Moors M.D.   On: 01/15/2016 09:53    EKG:   Orders placed or performed during the hospital encounter of 01/10/16  . ED EKG  . ED EKG  . EKG 12-Lead  . EKG 12-Lead  . EKG 12-Lead  . EKG 12-Lead    ASSESSMENT AND PLAN:   55 year old female with past medical history significant for end-stage renal disease on and if Wednesday Friday hemodialysis, diabetes, hypertension, polycystic kidney disease, COPD and neuropathy presents to hospital secondary to nausea vomiting and respiratory failure.  * Acute hypoxic respiratory failure due to acute on chronic diastolic CHF- pulmonary edema on admission Improved. Now extubated. Continues to be on oxygen. 2 L Secondary to acute pulmonary edema and fluid overload. -Improved noq Wean oxygen as tolerated. On 2l now  * Acute encephalopathy CT scan of the head showed nothing acute. from ICU delirium. Off precedex drip Monitor closely IMPROVING   * Anemia of chronic disease Monitor. epogen with dialysis.  * Accelerated hypertension On oral medications. Continue Norvasc, Lasix, metoprolol  * Diabetes mellitus Sliding scale insulin  * ESRD on MWF dialysis- per schedule, dialysis today Has right forearm AV fistula Appreciate nephrology consult  *DVT prophylaxis-on subcutaneous heparin   Physical therapy today.    All the records are reviewed and case discussed with Care Management/Social Workerr. Management plans discussed with the patient, family and they are in agreement.  CODE STATUS: Full code  TOTAL TIME TAKING CARE OF THIS PATIENT: 37 minutes.   POSSIBLE D/C IN 2-3 DAYS, DEPENDING ON CLINICAL CONDITION.   Gladstone Lighter M.D on 01/15/2016 at 2:30 PM  Between  7am to 6pm - Pager - [phone]  After 6pm go to [email or web address] - password [email or web address] [facility]  Office  [phone]  CC: Primary care physician; Elyse Jarvis, MD

## 2016-01-15 NOTE — Progress Notes (Signed)
C/O raw throat.  Extubated 3 days ago.  Called [facility] for chloraseptic spray.

## 2016-01-15 NOTE — Progress Notes (Signed)
PT Cancellation Note  Patient Details Name: Jasmine Holloway MRN: [id number] DOB: Dec 28, 1960   Cancelled Treatment:    Reason Eval/Treat Not Completed: Patient at procedure or test/unavailable. Pt's chart reviewed. Upon PT arrival, pt is out of her room for HD treatment and is unavailable. PT will f/u and resume therapy at a later time.   Neoma Laming, PT, DPT  01/15/2016, 3:48 PM [phone]

## 2016-01-15 NOTE — Progress Notes (Signed)
[facility] Progress Note Patient Name: Jasmine Holloway DOB: [birth year] MRN: [id number]   Date of Service  01/15/2016  HPI/Events of Note  Chest Pain - now resolved. Already on Metoprolol PO. Troponin #1 = 0.07.  eICU Interventions  Will order: 1. ASA 81 mg PO now and Q day. 2. Continue to trend Troponin.     Intervention Category Intermediate Interventions: Pain - evaluation and management  Sommer,Steven Eugene 01/15/2016, 10:13 PM

## 2016-01-15 NOTE — Progress Notes (Signed)
Nurse called to room by sitter saying pt felt unwell. When entering the room pt was complaining of her head "feeling funny" was not able to be more specific. Pt mentioned her arms feeling funny and seeing spots in her eyes. Pt's grips, and facial symmetry, were equal. Pupils were equal and reactive.  Equal sensation and equal strength against resistance.  Dr.Mungal called to bedside.  New orders given for 10 mg lopressor.  Safe to send to dialysis. Will continue to assess.

## 2016-01-15 NOTE — Progress Notes (Signed)
PHARMACY - PHYSICIAN COMMUNICATION CRITICAL VALUE ALERT - BLOOD CULTURE IDENTIFICATION (BCID)  Results for orders placed or performed during the hospital encounter of 01/10/16  Blood Culture ID Panel (Reflexed) (Collected: 01/10/2016 11:13 PM)  Result Value Ref Range   Enterococcus species NOT DETECTED NOT DETECTED   Vancomycin resistance NOT DETECTED NOT DETECTED   Listeria monocytogenes NOT DETECTED NOT DETECTED   Staphylococcus species NOT DETECTED NOT DETECTED   Staphylococcus aureus NOT DETECTED NOT DETECTED   Methicillin resistance NOT DETECTED NOT DETECTED   Streptococcus species NOT DETECTED NOT DETECTED   Streptococcus agalactiae NOT DETECTED NOT DETECTED   Streptococcus pneumoniae NOT DETECTED NOT DETECTED   Streptococcus pyogenes NOT DETECTED NOT DETECTED   Acinetobacter baumannii NOT DETECTED NOT DETECTED   Enterobacteriaceae species NOT DETECTED NOT DETECTED   Enterobacter cloacae complex NOT DETECTED NOT DETECTED   Escherichia coli NOT DETECTED NOT DETECTED   Klebsiella oxytoca NOT DETECTED NOT DETECTED   Klebsiella pneumoniae NOT DETECTED NOT DETECTED   Proteus species NOT DETECTED NOT DETECTED   Serratia marcescens NOT DETECTED NOT DETECTED   Carbapenem resistance NOT DETECTED NOT DETECTED   Haemophilus influenzae NOT DETECTED NOT DETECTED   Neisseria meningitidis NOT DETECTED NOT DETECTED   Pseudomonas aeruginosa NOT DETECTED NOT DETECTED   Candida albicans NOT DETECTED NOT DETECTED   Candida glabrata NOT DETECTED NOT DETECTED   Candida krusei NOT DETECTED NOT DETECTED   Candida parapsilosis NOT DETECTED NOT DETECTED   Candida tropicalis NOT DETECTED NOT DETECTED    Name of physician (or Provider) Contacted: Kalisetti  Changes to prescribed antibiotics required: No orders given. Likely contaminant   Vernal Rutan D 01/15/2016  7:37 AM

## 2016-01-15 NOTE — Progress Notes (Signed)
Speech Language Pathology Treatment: Dysphagia  Patient Details Name: Jasmine Holloway MRN: [id number] DOB: 1961-05-27 Today's Date: 01/15/2016 Time: [id number] SLP Time Calculation (min) (ACUTE ONLY): 35 min  Assessment / Plan / Recommendation Clinical Impression  Pt appeared to adequately toleration trials of thin liquids via straw w/ SLP, and w/ NSG during previous meals per reports w/ no overt s/s of aspiration noted. Pt is more alert(less confusion it appeared) and more verbal w/ improved vocal quality. Discussed general aspiration precautions including sitting upright for any oral intake, meds in puree. When asked if she would like to try trials of upgraded food texture, she declined stating she preferred the food consistency to remain as it it for today. She agreed to try an upgrade food consistency tomorrow w/ SLP. Recommend continuing current diet as ordered w/ monitoring at all meals. ST will f/u tomorrow; NSG updated.    HPI HPI: Pt is a 55 year old African-American female with a past medical history of end-stage renal disease on hemodialysis, type 2 diabetes, hypertension, polycystic kidney disease, asthma, COPD, and diabetic neuropathy who presented to the ED with complaints of abdominal pain, nausea, vomiting and migraine headaches that started today. History is obtained from EMS and ED records as patient is currently intubated. Per ED records, patient stated that she skipped dialysis today because she was in so much pain. Described pain as mostly located around the central region of her abdomen, severe, constant, and has gotten worse over the last 8 to 9 days. She has a ventral hernia and has had abdominal surgery before. She did not relay any relieving or aggravating factors. She reported 3 bowel movements prior to ED arrival. She reports having dialysis on Wednesday. She is also complaining of dyspnea that is mildly worse with exertion. At the ED, patient became very anxious, and her respirations  became labored with worsening shortness of breath. Her chest x-ray showed pulmonary edema and when she was placed on nasal cannula, patient kept complaining that she could not breath hence the ED MD intubated patient emergently and PCCM was consulted for admission. Patient is now intubated and sedated. Heart blood pressure is still elevated with systolic in the 0000000. Pt was intubated ~2 days. She is extubated now w/ vocal quality c/b hoarseness and low volume(slightly improved from yesterday). Pt continues to have some confusion. Pt has toleated her current rec'd diet of Dys. 1 w/ thin liquids.       SLP Plan  Continue with current plan of care     Recommendations  Diet recommendations: Dysphagia 1 (puree);Thin liquid (per pt request for today) Liquids provided via: Straw;Cup Medication Administration: Whole meds with puree (crush if necessary and able to) Supervision: Staff to assist with self feeding;Full supervision/cueing for compensatory strategies Compensations: Minimize environmental distractions;Slow rate;Small sips/bites;Lingual sweep for clearance of pocketing;Follow solids with liquid Postural Changes and/or Swallow Maneuvers: Seated upright 90 degrees;Upright 30-60 min after meal             General recommendations:  (Dietician following) Oral Care Recommendations: Oral care BID;Staff/trained caregiver to provide oral care Follow up Recommendations:  (TBD) Plan: Continue with current plan of care     [place], [place], CCC-SLP  Jasmine Holloway 01/15/2016, 4:46 PM

## 2016-01-15 NOTE — Progress Notes (Signed)
Dialysis started 

## 2016-01-15 NOTE — Progress Notes (Signed)
SPIDERS

## 2016-01-15 NOTE — Care Management (Addendum)
Spoke with patient.  She is oriented and alert but her thoughts seem a little scattered.  She apologizes for her behaviors of the last few days.  Says she was seeing spiders "all over."  She says she only missed one dialysis treatment.  Lives alone and says she is independent in all her adls and drives herself to her dialysis appointments.  Says she does not require a walker.  Physical therapy has evaluated patient and recommended skilled nursing facility placement.  Patient is in agreement.  This recommendation may change as medical condition improves.  Precedex discontinued

## 2016-01-15 NOTE — Progress Notes (Signed)
PULMONARY / CRITICAL CARE MEDICINE   Name: Jasmine Holloway MRN: [id number] DOB: 1961/02/07    ADMISSION DATE:  01/10/2016   DISCUSSION: 55 year old African-American female presenting with acute pulmonary edema secondary to volume overload from non-adherence to dialysis, acute hypoxic and hypercarbic respiratory failure, and abdominal pain of unknown etiology. Abdominal pain likely due to diabetic gastroparesis than to any infectious process. Now back to baseline mentation and no respiratory distress  ASSESSMENT / PLAN:  PULMONARY A: Acute hypoxic/hypercarbic respiratory failure S/P extubation 06/04  History of COPD/asthma Pulmonary edema P:   -Supplemental O2 -Nebulized steroids and bronchodilators  CARDIOVASCULAR A:  Hypertension Volume overload secondary to noncompliance with dialysis P:  -Hemodynamics per ICU protocol -Hydralazine 10 mg IV every 4 hours and labetalol Q2H when necessary for systolic blood pressure greater than 140 -Dialysis and diuresis per nephrology  RENAL A:   End-stage renal disease on dialysis Polycystic kidney disease Severe hypokalemia-potassium down to 4.3 post dialysis; HD 6/3 AND 6/4 P:   -Hemodialysis per nephrology -Monitor and correct electrolyte imbalances   GASTROINTESTINAL A:   Abdominal pain-unclear etiology;  rule out diabetic gastroparesis-abdominal ultrasound does not show any acute changes except for questionable cholelithiasis History of ventral hernia P:   -Monitor abdominal pain post extubation -PPI stress ulcer prophylaxis -Reglan 5 mg every 6 hours when necessary for nausea and vomiting  HEMATOLOGIC A:   Chronic anemia-hemoglobin 8.7 s/p transfusion P:  -Trend CBC. -Transfused 1 unit of packed red blood cells 06/04 -Heparin for VTE prophylaxis  INFECTIOUS A:   No acute issues-pro-calcitonin mildly elevated P:   -Follow-up cultures -No antibiotics for now  CULTURES: Blood cultures 2.- GPC in aerobic bottle Blood  Culture 6/7> MRSA screen negative. Urine culture>>mutlispecies, recollect Urine culture 6/6>>  ANTIBIOTICS: None  ENDOCRINE A:   Type 2 diabetes mellitus P:   -Blood glucose monitoring with sliding scale insulin coverage  NEUROLOGIC A:   Altered mental status-improved Acute agitation and anxiety- unclear etiology P:   RASS goal: 0  -Precedex gtt wean off 6/6 at night, order dc'ed  - prn haldol and ativan   Thank you for consulting [facility], we will signoff at this time.  Please feel free to contact us with any questions at [phone] (please enter 7-digits).   STUDIES:  Ultrasound abdomen 01/11/2016-done see report   SIGNIFICANT EVENTS: 01/10/2016 ED with abdominal pain, became agitated, intubated for airway protection  LINES/TUBES: Peripheral IVs Dialysis shunt  --------------------------------  SUBJECTIVE: Extubated, doing well, back to baseline mentation and appropriate, has a productive cough .   VITAL SIGNS: BP 183/72 mmHg  Pulse 87  Temp(Src) 98.3 F (36.8 C) (Oral)  Resp 17  Ht 5\' 3"  (1.6 m)  Wt 186 lb 8.2 oz (84.6 kg)  BMI 33.05 kg/m2  SpO2 100%  HEMODYNAMICS:    VENTILATOR SETTINGS: Vent Mode:  [-]  FiO2 (%):  [28 %] 28 %  INTAKE / OUTPUT: I/O last 3 completed shifts: In: 693 [I.V.:456; NG/GT:237] Out: 2000 [Other:2000]  PHYSICAL EXAMINATION: General: Chronically ill-looking, no distress Neuro:  Awakens to voice and noxious stimulus, moves all extremities to pain; easily gets agitated - improving HEENT: PEERLA, conjunctivae pink, trachea midline Cardiovascular:  Rate and rhythm regular, S1, S2, no murmur, regurg or gallop Lungs: Clear to auscultation bilaterally without wheezes. Mild coarse upper airway sounds Abdomen: No palpable organomegaly, normal bowel sounds Musculoskeletal:  Positive range of motion in upper and lower extremities Extremities: +2 pulses, trace edema Skin:  Warm  and  dry  LABS:  BMET  Recent Labs Lab 01/13/16 0442 01/14/16 0434 01/15/16 0503  NA 141 142 140  K 4.3 4.2 3.8  CL 101 103 99*  CO2 30 28 27   BUN 23* 20 41*  CREATININE 6.13* 5.58* 8.12*  GLUCOSE 96 81 98    Electrolytes  Recent Labs Lab 01/11/16 0331  01/12/16 0542 01/13/16 0442 01/14/16 0434 01/15/16 0503  CALCIUM 7.8*  < > 8.0* 8.4* 8.7* 9.2  MG 2.2  --  2.0 2.1  --   --   PHOS 8.6*  --  6.9* 6.6*  --   --   < > = values in this interval not displayed.  CBC  Recent Labs Lab 01/12/16 0542 01/12/16 0815 01/13/16 0442 01/14/16 0434  WBC 7.5  --  9.9 9.4  HGB 7.3* 7.2* 8.7* 9.1*  HCT 22.0*  --  27.1* 27.6*  PLT 242  --  245 249    Coag's No results for input(s): APTT, INR in the last 168 hours.  Sepsis Markers  Recent Labs Lab 01/10/16 2107 01/10/16 2139  LATICACIDVEN  --  0.8  PROCALCITON 1.76  --     ABG  Recent Labs Lab 01/11/16 0400 01/12/16 0450 01/12/16 0848  PHART 7.33* 7.50* 7.40  PCO2ART 37 45 54*  PO2ART 97 67* 76*    Liver Enzymes  Recent Labs Lab 01/08/16 1723 01/10/16 1308 01/11/16 0950  AST 17 24 31   ALT 14 20 37  ALKPHOS 51 58 53  BILITOT 0.5 0.7 0.4  ALBUMIN 3.7 3.6 3.6    Cardiac Enzymes  Recent Labs Lab 01/10/16 2107 01/11/16 0331 01/11/16 0950  TROPONINI 0.08* 0.07* 0.05*    Glucose  Recent Labs Lab 01/14/16 1637 01/14/16 2012 01/14/16 2344 01/15/16 0444 01/15/16 0736 01/15/16 1131  GLUCAP 74 84 108* 97 88 123*    Imaging Dg Chest Port 1 View  01/15/2016  CLINICAL DATA:  Acute respiratory failure EXAM: PORTABLE CHEST 1 VIEW COMPARISON:  01/13/2016 FINDINGS: There is a left IJ catheter with tip in the projection of the SVC. There is moderate cardiac enlargement. Pulmonary vascular congestion is identified. No pleural effusion noted. IMPRESSION: 1. Cardiac enlargement and pulmonary vascular congestion. Electronically Signed   By: Kerby Moors M.D.   On: 01/15/2016 09:53    I have personally  obtained a history, examined the patient, evaluated laboratory and imaging results, formulated the assessment and plan and placed orders. Critical Care Time devoted to patient care services described in this note is 30 minutes.    Vilinda Boehringer, MD [facility] Pager [phone] (please enter 7-digits) On Call Pager - [phone] (please enter 7-digits)     01/15/2016, 2:16 PM

## 2016-01-15 NOTE — Significant Event (Signed)
Rapid Response Event Note  Overview:  Rapid Response called to hemodialysis due to pt BP being elevated and pt complaining of chest pain.  Initial Focused Assessment: I arrived to the patient complaining of chest pain feeling like an elephant was sitting on her chest. Pt BP continued to be elevated.   Interventions: EKG performed and Nitro x 2 given. Dr. Tressia Miners notified. Orders received. tx back to ICU.  Event Summary:   at      at          Dobson E 6:15 PM 01/15/2016

## 2016-01-15 NOTE — Progress Notes (Signed)
Subjective:   Mental status is better today Appears to be back to baseline No Nausea or vomiting reported Able to eat breakfast No SOB reported     Objective:  Vital signs in last 24 hours:  Temp:  [98.3 F (36.8 C)-98.5 F (36.9 C)] 98.3 F (36.8 C) (06/07 0800) Pulse Rate:  [72-120] 104 (06/07 0800) Resp:  [10-28] 19 (06/07 0800) BP: (119-192)/(60-99) 184/73 mmHg (06/07 0843) SpO2:  [94 %-100 %] 98 % (06/07 0910) FiO2 (%):  [28 %] 28 % (06/07 0910) Weight:  [84.6 kg (186 lb 8.2 oz)] 84.6 kg (186 lb 8.2 oz) (06/07 0500)  Weight change: -1.1 kg (-2 lb 6.8 oz) Filed Weights   01/13/16 2015 01/14/16 0500 01/15/16 0500  Weight: 83.6 kg (184 lb 4.9 oz) 83.3 kg (183 lb 10.3 oz) 84.6 kg (186 lb 8.2 oz)    Intake/Output:    Intake/Output Summary (Last 24 hours) at 01/15/16 0937 Last data filed at 01/15/16 0933  Gross per 24 hour  Intake 352.12 ml  Output    500 ml  Net -147.88 ml     Physical Exam: General: Chronically ill appearing  HEENT Anicteric, moist oral mucus membranes  Neck supple  Pulm/lungs Clear today, improved  CVS/Heart regular  Abdomen:  Soft, nontender  Extremities: +trace edema  Neurologic: Awake, alert, following simple commands  Skin: No acute rashes  Access: AVG- rt arm       Basic Metabolic Panel:   Recent Labs Lab 01/11/16 0331 01/11/16 0950 01/12/16 0542 01/13/16 0442 01/14/16 0434 01/15/16 0503  NA 135 139 138 141 142 140  K 7.5* 5.6* 4.2 4.3 4.2 3.8  CL 102 102 99* 101 103 99*  CO2 18* 21* 29 30 28 27   GLUCOSE 118* 122* 119* 96 81 98  BUN 86* 72* 41* 23* 20 41*  CREATININE 15.75* 12.40* 8.63* 6.13* 5.58* 8.12*  CALCIUM 7.8* 8.4* 8.0* 8.4* 8.7* 9.2  MG 2.2  --  2.0 2.1  --   --   PHOS 8.6*  --  6.9* 6.6*  --   --      CBC:  Recent Labs Lab 01/11/16 0331 01/11/16 0950 01/12/16 0542 01/12/16 0815 01/13/16 0442 01/14/16 0434  WBC 5.8 4.6 7.5  --  9.9 9.4  NEUTROABS  --   --   --   --   --  6.6*  HGB 8.1* 8.9*  7.3* 7.2* 8.7* 9.1*  HCT 24.3* 26.9* 22.0*  --  27.1* 27.6*  MCV 89.0 88.0 88.4  --  88.3 88.4  PLT 264 302 242  --  245 249      Microbiology:  Recent Results (from the past 720 hour(s))  MRSA PCR Screening     Status: None   Collection Time: 12/21/15  6:10 AM  Result Value Ref Range Status   MRSA by PCR NEGATIVE NEGATIVE Final    Comment:        The GeneXpert MRSA Assay (FDA approved for NASAL specimens only), is one component of a comprehensive MRSA colonization surveillance program. It is not intended to diagnose MRSA infection nor to guide or monitor treatment for MRSA infections.   Urine culture     Status: Abnormal   Collection Time: 01/10/16  1:08 PM  Result Value Ref Range Status   Specimen Description URINE, CLEAN CATCH  Final   Special Requests NONE  Final   Culture MULTIPLE SPECIES PRESENT, SUGGEST RECOLLECTION (A)  Final   Report Status 01/12/2016 FINAL  Final  Culture, blood (routine x 2)     Status: None (Preliminary result)   Collection Time: 01/10/16  9:39 PM  Result Value Ref Range Status   Specimen Description BLOOD LEFT ASSIST CONTROL  Final   Special Requests   Final    BOTTLES DRAWN AEROBIC AND ANAEROBIC 12CCAERO,10CCANA   Culture NO GROWTH 4 DAYS  Final   Report Status PENDING  Incomplete  Culture, blood (routine x 2)     Status: None (Preliminary result)   Collection Time: 01/10/16 11:13 PM  Result Value Ref Range Status   Specimen Description BLOOD LEFT HAND  Final   Special Requests BOTTLES DRAWN AEROBIC AND ANAEROBIC [place]  Final   Culture  Setup Time   Final    GRAM POSITIVE COCCI AEROBIC BOTTLE ONLY CRITICAL RESULT CALLED TO, READ BACK BY AND VERIFIED WITH: CHRISTINE KATSOUDAS ON 01/15/16 AT 0724 BY TLB CONFIRMED BY TLB/SGD Organism ID to follow    Culture   Final    GRAM POSITIVE COCCI AEROBIC BOTTLE ONLY IDENTIFICATION TO FOLLOW    Report Status PENDING  Incomplete  MRSA PCR Screening     Status: None   Collection Time:  01/10/16 11:13 PM  Result Value Ref Range Status   MRSA by PCR NEGATIVE NEGATIVE Final    Comment:        The GeneXpert MRSA Assay (FDA approved for NASAL specimens only), is one component of a comprehensive MRSA colonization surveillance program. It is not intended to diagnose MRSA infection nor to guide or monitor treatment for MRSA infections.   Blood Culture ID Panel (Reflexed)     Status: None   Collection Time: 01/10/16 11:13 PM  Result Value Ref Range Status   Enterococcus species NOT DETECTED NOT DETECTED Final   Vancomycin resistance NOT DETECTED NOT DETECTED Final   Listeria monocytogenes NOT DETECTED NOT DETECTED Final   Staphylococcus species NOT DETECTED NOT DETECTED Final   Staphylococcus aureus NOT DETECTED NOT DETECTED Final   Methicillin resistance NOT DETECTED NOT DETECTED Final   Streptococcus species NOT DETECTED NOT DETECTED Final   Streptococcus agalactiae NOT DETECTED NOT DETECTED Final   Streptococcus pneumoniae NOT DETECTED NOT DETECTED Final   Streptococcus pyogenes NOT DETECTED NOT DETECTED Final   Acinetobacter baumannii NOT DETECTED NOT DETECTED Final   Enterobacteriaceae species NOT DETECTED NOT DETECTED Final   Enterobacter cloacae complex NOT DETECTED NOT DETECTED Final   Escherichia coli NOT DETECTED NOT DETECTED Final   Klebsiella oxytoca NOT DETECTED NOT DETECTED Final   Klebsiella pneumoniae NOT DETECTED NOT DETECTED Final   Proteus species NOT DETECTED NOT DETECTED Final   Serratia marcescens NOT DETECTED NOT DETECTED Final   Carbapenem resistance NOT DETECTED NOT DETECTED Final   Haemophilus influenzae NOT DETECTED NOT DETECTED Final   Neisseria meningitidis NOT DETECTED NOT DETECTED Final   Pseudomonas aeruginosa NOT DETECTED NOT DETECTED Final   Candida albicans NOT DETECTED NOT DETECTED Final   Candida glabrata NOT DETECTED NOT DETECTED Final   Candida krusei NOT DETECTED NOT DETECTED Final   Candida parapsilosis NOT DETECTED NOT  DETECTED Final   Candida tropicalis NOT DETECTED NOT DETECTED Final    Coagulation Studies: No results for input(s): LABPROT, INR in the last 72 hours.  Urinalysis: No results for input(s): COLORURINE, LABSPEC, PHURINE, GLUCOSEU, HGBUR, BILIRUBINUR, KETONESUR, PROTEINUR, UROBILINOGEN, NITRITE, LEUKOCYTESUR in the last 72 hours.  Invalid input(s): APPERANCEUR    Imaging: No results found.   Medications:   . dexmedetomidine Stopped (01/14/16 1516)   .  amLODipine  5 mg Oral Daily  . antiseptic oral rinse  7 mL Mouth Rinse q12n4p  . budesonide (PULMICORT) nebulizer solution  0.25 mg Nebulization Q12H  . chlorhexidine  15 mL Mouth Rinse BID  . epoetin (EPOGEN/PROCRIT) injection  10,000 Units Intravenous Q M,W,F-HD  . famotidine (PEPCID) IV  20 mg Intravenous Q48H  . feeding supplement (NEPRO CARB STEADY)  237 mL Oral BID BM  . furosemide  80 mg Oral Daily  . heparin  5,000 Units Subcutaneous Q8H  . insulin aspart  0-9 Units Subcutaneous Q4H  . metoprolol succinate  50 mg Oral Daily   sodium chloride, acetaminophen, albuterol, bisacodyl, haloperidol lactate, hydrALAZINE, ipratropium-albuterol, LORazepam, ondansetron (ZOFRAN) IV, oxyCODONE-acetaminophen, phenol, sennosides  Assessment/ Plan:  55 y.o.black female with ESRD secondary to polycystic kidney hypertension, GERD, peritoneal dialysis h/o peritonitis, arthritis, chronic left hand pain, ruptured access   MWF 3rd shift, [facility] [address] [address]   1. End Stage Renal Disease: with hyperkalemia and metabolic acidosis: emergent hemodialysis yesterday for hyperkalemia and pulmonary edema. Also received IV contrast in ED. Plan for HD today  2. Acute Respiratory failure with Acute pulmonary edema:required mechanical ventilation - extubated now - doing well  3. Anemia of chronic kidney disease: hemogobin 9.1 - EPO with MWF schedule  4. Secondary Hyperparathyroidism: hyperphosphatemia as outpatient with phos of 6.6 - restart  cinacalcet,  - restart binders   LOS:  Jasmine Holloway 6/7/20179:37 AM

## 2016-01-15 NOTE — Progress Notes (Signed)
[facility] Progress Note Patient Name: Jasmine Holloway DOB: 10/22/1960 MRN: [id number]   Date of Service  01/15/2016  HPI/Events of Note  Throat "raw" still after extubation  eICU Interventions  chloraseptioc     Intervention Category Minor Interventions: Routine modifications to care plan (e.g. PRN medications for pain, fever)  Raylene Miyamoto. 01/15/2016, 6:45 AM

## 2016-01-16 LAB — BASIC METABOLIC PANEL
ANION GAP: 10 (ref 5–15)
BUN: 26 mg/dL — AB (ref 6–20)
CHLORIDE: 97 mmol/L — AB (ref 101–111)
CO2: 31 mmol/L (ref 22–32)
Calcium: 9.2 mg/dL (ref 8.9–10.3)
Creatinine, Ser: 5.93 mg/dL — ABNORMAL HIGH (ref 0.44–1.00)
GFR calc Af Amer: 8 mL/min — ABNORMAL LOW (ref >60)
GFR calc non Af Amer: 7 mL/min — ABNORMAL LOW (ref >60)
GLUCOSE: 161 mg/dL — AB (ref 65–99)
POTASSIUM: 4 mmol/L (ref 3.5–5.1)
SODIUM: 138 mmol/L (ref 135–145)

## 2016-01-16 LAB — CULTURE, BLOOD (ROUTINE X 2): CULTURE: NO GROWTH

## 2016-01-16 LAB — GLUCOSE, CAPILLARY
GLUCOSE-CAPILLARY: 108 mg/dL — AB (ref 65–99)
GLUCOSE-CAPILLARY: 128 mg/dL — AB (ref 65–99)
Glucose-Capillary: 119 mg/dL — ABNORMAL HIGH (ref 65–99)
Glucose-Capillary: 118 mg/dL — ABNORMAL HIGH (ref 65–99)
Glucose-Capillary: 77 mg/dL (ref 65–99)

## 2016-01-16 LAB — PHOSPHORUS: Phosphorus: 6 mg/dL — ABNORMAL HIGH (ref 2.5–4.6)

## 2016-01-16 LAB — TROPONIN I
Troponin I: 0.06 ng/mL — ABNORMAL HIGH (ref <0.031)
Troponin I: 0.06 ng/mL — ABNORMAL HIGH (ref <0.031)

## 2016-01-16 MED ORDER — ALPRAZOLAM 0.25 MG PO TABS
0.5000 mg | ORAL_TABLET | Freq: Three times a day (TID) | ORAL | Status: DC
  Administered 2016-01-16 (×3): 0.5 mg via ORAL
  Filled 2016-01-16 (×3): qty 1

## 2016-01-16 MED ORDER — CLONIDINE HCL 0.1 MG PO TABS
0.2000 mg | ORAL_TABLET | ORAL | Status: AC
  Administered 2016-01-16: 0.2 mg via ORAL
  Filled 2016-01-16 (×2): qty 2

## 2016-01-16 MED ORDER — AMOXICILLIN-POT CLAVULANATE 500-125 MG PO TABS
1.0000 | ORAL_TABLET | ORAL | Status: DC
  Administered 2016-01-16: 500 mg via ORAL
  Filled 2016-01-16 (×2): qty 1

## 2016-01-16 MED ORDER — CLONIDINE HCL 0.1 MG PO TABS
0.1000 mg | ORAL_TABLET | Freq: Two times a day (BID) | ORAL | Status: DC
  Administered 2016-01-16: 0.1 mg via ORAL
  Filled 2016-01-16: qty 1

## 2016-01-16 MED ORDER — AMOXICILLIN-POT CLAVULANATE 500-125 MG PO TABS
1.0000 | ORAL_TABLET | Freq: Once | ORAL | Status: AC
  Administered 2016-01-16: 500 mg via ORAL
  Filled 2016-01-16: qty 1

## 2016-01-16 MED ORDER — AMLODIPINE BESYLATE 10 MG PO TABS
10.0000 mg | ORAL_TABLET | Freq: Every day | ORAL | Status: DC
  Administered 2016-01-16: 10 mg via ORAL
  Filled 2016-01-16: qty 1

## 2016-01-16 MED ORDER — IRBESARTAN 150 MG PO TABS
150.0000 mg | ORAL_TABLET | Freq: Every day | ORAL | Status: DC
  Administered 2016-01-16: 150 mg via ORAL
  Filled 2016-01-16: qty 2

## 2016-01-16 MED ORDER — SALINE SPRAY 0.65 % NA SOLN
1.0000 | NASAL | Status: DC | PRN
  Filled 2016-01-16: qty 44

## 2016-01-16 MED ORDER — ROSUVASTATIN CALCIUM 10 MG PO TABS
5.0000 mg | ORAL_TABLET | Freq: Every day | ORAL | Status: DC
  Administered 2016-01-16: 5 mg via ORAL
  Filled 2016-01-16: qty 1

## 2016-01-16 MED ORDER — BUDESONIDE 0.25 MG/2ML IN SUSP
0.2500 mg | Freq: Two times a day (BID) | RESPIRATORY_TRACT | Status: DC
  Administered 2016-01-17: 0.25 mg via RESPIRATORY_TRACT
  Filled 2016-01-16: qty 2

## 2016-01-16 NOTE — Progress Notes (Signed)
RT responded to rapid called on ICU patient in dialysis experiencing chest pain.  RT arrived with EKG machine, EKG obtained.  ICU RN Margreta Journey stated no further need of respiratory at this time.

## 2016-01-16 NOTE — Progress Notes (Signed)
Post dialysis 

## 2016-01-16 NOTE — Progress Notes (Signed)
Pt is alert and oriented x4 at this time. Pt orientation has improved greatly at this time. Pt BP has been controlled below 160's since 1500 this pm. Pt has had no complaints of pain. Pt had a bath this afternoon. Pt in stable condition at this time. Pt on 2L O2. Report given to [facility].

## 2016-01-16 NOTE — Progress Notes (Signed)
Tx complete  

## 2016-01-16 NOTE — Progress Notes (Signed)
Subjective:   Mental status is better today Appears to be back to baseline No Nausea or vomiting reported Able to eat breakfast No SOB reported Patient seen during dialysis Tolerating well    HEMODIALYSIS FLOWSHEET:  Blood Flow Rate (mL/min): 400 mL/min Arterial Pressure (mmHg): -160 mmHg Venous Pressure (mmHg): 260 mmHg Transmembrane Pressure (mmHg): 50 mmHg Ultrafiltration Rate (mL/min): 290 mL/min Dialysate Flow Rate (mL/min): 600 ml/min Conductivity: Machine : 14.1 Conductivity: Machine : 14.1 Dialysis Fluid Bolus: Normal Saline Bolus Amount (mL): 250 mL Dialysate Change:  (3K) Intra-Hemodialysis Comments: 1000. Tx complete      Objective:  Vital signs in last 24 hours:  Temp:  [98.2 F (36.8 C)-98.9 F (37.2 C)] 98.5 F (36.9 C) (06/08 0900) Pulse Rate:  [70-103] 76 (06/08 0930) Resp:  [12-27] 20 (06/08 0930) BP: (135-211)/(60-155) 180/86 mmHg (06/08 0930) SpO2:  [91 %-100 %] 100 % (06/08 0930) Weight:  [80.9 kg (178 lb 5.6 oz)-85.1 kg (187 lb 9.8 oz)] 84.1 kg (185 lb 6.5 oz) (06/08 0900)  Weight change: 0.5 kg (1 lb 1.6 oz) Filed Weights   01/15/16 1521 01/16/16 0425 01/16/16 0900  Weight: 85.1 kg (187 lb 9.8 oz) 80.9 kg (178 lb 5.6 oz) 84.1 kg (185 lb 6.5 oz)    Intake/Output:    Intake/Output Summary (Last 24 hours) at 01/16/16 0935 Last data filed at 01/15/16 1758  Gross per 24 hour  Intake      0 ml  Output    860 ml  Net   -860 ml     Physical Exam: General: Chronically ill appearing  HEENT Anicteric, moist oral mucus membranes  Neck supple  Pulm/lungs Clear today, improved  CVS/Heart regular  Abdomen:  Soft, nontender  Extremities: +trace edema  Neurologic: Awake, alert, following simple commands  Skin: No acute rashes  Access: AVG- rt arm       Basic Metabolic Panel:   Recent Labs Lab 01/11/16 0331  01/12/16 0542 01/13/16 0442 01/14/16 0434 01/15/16 0503 01/16/16 0229  NA 135  < > 138 141 142 140 138  K 7.5*  < > 4.2  4.3 4.2 3.8 4.0  CL 102  < > 99* 101 103 99* 97*  CO2 18*  < > 29 30 28 27 31   GLUCOSE 118*  < > 119* 96 81 98 161*  BUN 86*  < > 41* 23* 20 41* 26*  CREATININE 15.75*  < > 8.63* 6.13* 5.58* 8.12* 5.93*  CALCIUM 7.8*  < > 8.0* 8.4* 8.7* 9.2 9.2  MG 2.2  --  2.0 2.1  --   --   --   PHOS 8.6*  --  6.9* 6.6*  --   --   --   < > = values in this interval not displayed.   CBC:  Recent Labs Lab 01/11/16 0331 01/11/16 0950 01/12/16 0542 01/12/16 0815 01/13/16 0442 01/14/16 0434  WBC 5.8 4.6 7.5  --  9.9 9.4  NEUTROABS  --   --   --   --   --  6.6*  HGB 8.1* 8.9* 7.3* 7.2* 8.7* 9.1*  HCT 24.3* 26.9* 22.0*  --  27.1* 27.6*  MCV 89.0 88.0 88.4  --  88.3 88.4  PLT 264 302 242  --  245 249      Microbiology:  Recent Results (from the past 720 hour(s))  MRSA PCR Screening     Status: None   Collection Time: 12/21/15  6:10 AM  Result Value Ref Range Status  MRSA by PCR NEGATIVE NEGATIVE Final    Comment:        The GeneXpert MRSA Assay (FDA approved for NASAL specimens only), is one component of a comprehensive MRSA colonization surveillance program. It is not intended to diagnose MRSA infection nor to guide or monitor treatment for MRSA infections.   Urine culture     Status: Abnormal   Collection Time: 01/10/16  1:08 PM  Result Value Ref Range Status   Specimen Description URINE, CLEAN CATCH  Final   Special Requests NONE  Final   Culture MULTIPLE SPECIES PRESENT, SUGGEST RECOLLECTION (A)  Final   Report Status 01/12/2016 FINAL  Final  Culture, blood (routine x 2)     Status: None (Preliminary result)   Collection Time: 01/10/16  9:39 PM  Result Value Ref Range Status   Specimen Description BLOOD LEFT ASSIST CONTROL  Final   Special Requests   Final    BOTTLES DRAWN AEROBIC AND ANAEROBIC 12CCAERO,10CCANA   Culture NO GROWTH 4 DAYS  Final   Report Status PENDING  Incomplete  Culture, blood (routine x 2)     Status: None (Preliminary result)   Collection Time:  01/10/16 11:13 PM  Result Value Ref Range Status   Specimen Description BLOOD LEFT HAND  Final   Special Requests BOTTLES DRAWN AEROBIC AND ANAEROBIC [place]  Final   Culture  Setup Time   Final    GRAM POSITIVE COCCI AEROBIC BOTTLE ONLY CRITICAL RESULT CALLED TO, READ BACK BY AND VERIFIED WITH: CHRISTINE KATSOUDAS ON 01/15/16 AT 0724 BY TLB CONFIRMED BY TLB/SGD Organism ID to follow    Culture   Final    GRAM POSITIVE COCCI IDENTIFICATION TO FOLLOW Performed at [facility]    Report Status PENDING  Incomplete  MRSA PCR Screening     Status: None   Collection Time: 01/10/16 11:13 PM  Result Value Ref Range Status   MRSA by PCR NEGATIVE NEGATIVE Final    Comment:        The GeneXpert MRSA Assay (FDA approved for NASAL specimens only), is one component of a comprehensive MRSA colonization surveillance program. It is not intended to diagnose MRSA infection nor to guide or monitor treatment for MRSA infections.   Blood Culture ID Panel (Reflexed)     Status: None   Collection Time: 01/10/16 11:13 PM  Result Value Ref Range Status   Enterococcus species NOT DETECTED NOT DETECTED Final   Vancomycin resistance NOT DETECTED NOT DETECTED Final   Listeria monocytogenes NOT DETECTED NOT DETECTED Final   Staphylococcus species NOT DETECTED NOT DETECTED Final   Staphylococcus aureus NOT DETECTED NOT DETECTED Final   Methicillin resistance NOT DETECTED NOT DETECTED Final   Streptococcus species NOT DETECTED NOT DETECTED Final   Streptococcus agalactiae NOT DETECTED NOT DETECTED Final   Streptococcus pneumoniae NOT DETECTED NOT DETECTED Final   Streptococcus pyogenes NOT DETECTED NOT DETECTED Final   Acinetobacter baumannii NOT DETECTED NOT DETECTED Final   Enterobacteriaceae species NOT DETECTED NOT DETECTED Final   Enterobacter cloacae complex NOT DETECTED NOT DETECTED Final   Escherichia coli NOT DETECTED NOT DETECTED Final   Klebsiella oxytoca NOT DETECTED NOT  DETECTED Final   Klebsiella pneumoniae NOT DETECTED NOT DETECTED Final   Proteus species NOT DETECTED NOT DETECTED Final   Serratia marcescens NOT DETECTED NOT DETECTED Final   Carbapenem resistance NOT DETECTED NOT DETECTED Final   Haemophilus influenzae NOT DETECTED NOT DETECTED Final   Neisseria meningitidis NOT DETECTED NOT DETECTED Final  Pseudomonas aeruginosa NOT DETECTED NOT DETECTED Final   Candida albicans NOT DETECTED NOT DETECTED Final   Candida glabrata NOT DETECTED NOT DETECTED Final   Candida krusei NOT DETECTED NOT DETECTED Final   Candida parapsilosis NOT DETECTED NOT DETECTED Final   Candida tropicalis NOT DETECTED NOT DETECTED Final    Coagulation Studies: No results for input(s): LABPROT, INR in the last 72 hours.  Urinalysis: No results for input(s): COLORURINE, LABSPEC, PHURINE, GLUCOSEU, HGBUR, BILIRUBINUR, KETONESUR, PROTEINUR, UROBILINOGEN, NITRITE, LEUKOCYTESUR in the last 72 hours.  Invalid input(s): APPERANCEUR    Imaging: Dg Chest Port 1 View  01/15/2016  CLINICAL DATA:  Acute respiratory failure EXAM: PORTABLE CHEST 1 VIEW COMPARISON:  01/13/2016 FINDINGS: There is a left IJ catheter with tip in the projection of the SVC. There is moderate cardiac enlargement. Pulmonary vascular congestion is identified. No pleural effusion noted. IMPRESSION: 1. Cardiac enlargement and pulmonary vascular congestion. Electronically Signed   By: Kerby Moors M.D.   On: 01/15/2016 09:53     Medications:     . amLODipine  10 mg Oral QHS  . amoxicillin-clavulanate  1 tablet Oral Once  . amoxicillin-clavulanate  1 tablet Oral Q24H  . aspirin  81 mg Oral Daily  . budesonide (PULMICORT) nebulizer solution  0.25 mg Nebulization Q12H  . calcium acetate  1,334 mg Oral TID WC  . cinacalcet  30 mg Oral Q supper  . epoetin (EPOGEN/PROCRIT) injection  10,000 Units Intravenous Q M,W,F-HD  . famotidine  10 mg Oral Daily  . feeding supplement (NEPRO CARB STEADY)  237 mL Oral  BID BM  . furosemide  80 mg Oral Daily  . heparin  5,000 Units Subcutaneous Q8H  . insulin aspart  0-9 Units Subcutaneous Q4H  . irbesartan  150 mg Oral QHS  . metoprolol succinate  50 mg Oral Daily   sodium chloride, acetaminophen, albuterol, bisacodyl, haloperidol lactate, hydrALAZINE, HYDROmorphone (DILAUDID) injection, ipratropium-albuterol, labetalol, LORazepam, ondansetron (ZOFRAN) IV, oxyCODONE-acetaminophen, phenol, sennosides  Assessment/ Plan:  55 y.o.black female with ESRD secondary to polycystic kidney hypertension, GERD, peritoneal dialysis h/o peritonitis, arthritis, chronic left hand pain, ruptured access   MWF 3rd shift, [employer] [address] [address]   1. End Stage Renal Disease: with hyperkalemia and metabolic acidosis: emergent hemodialysis yesterday for hyperkalemia and pulmonary edema. Also received IV contrast in ED. Patient seen during dialysis Tolerating well   2. Acute Respiratory failure with Acute pulmonary edema:required mechanical ventilation - extubated now - doing well  3. Anemia of chronic kidney disease: hemogobin 9.1 - EPO with MWF schedule  4. Secondary Hyperparathyroidism: hyperphosphatemia as outpatient with phos of 6.0 - restart cinacalcet,  - restart binders   LOS:  Mykel Sponaugle 6/8/20179:35 AM

## 2016-01-16 NOTE — Progress Notes (Signed)
Pre Dialysis 

## 2016-01-16 NOTE — Progress Notes (Signed)
[facility] NAME: Jasmine Holloway    MR#:  [id number]  DATE OF BIRTH:  Dec 07, 1960  SUBJECTIVE:  CHIEF COMPLAINT:   Chief Complaint  Patient presents with  . Abdominal Pain   -alert adn almost back to baseline - had an episode of chest pain with dialysis yesterday- so was not finished. Dialysis done today - troponins stable, no chest pain now - BP elevated   REVIEW OF SYSTEMS:  Review of Systems  Constitutional: Negative for fever, chills and malaise/fatigue.  HENT: Positive for sore throat. Negative for ear discharge, ear pain and tinnitus.   Eyes: Negative for blurred vision and double vision.  Respiratory: Negative for cough, shortness of breath and wheezing.   Cardiovascular: Negative for chest pain, palpitations and leg swelling.  Gastrointestinal: Negative for nausea, vomiting, abdominal pain, diarrhea and constipation.  Genitourinary: Negative for dysuria and urgency.  Musculoskeletal: Negative for myalgias and neck pain.  Neurological: Negative for dizziness, sensory change, speech change, focal weakness, seizures and headaches.  Psychiatric/Behavioral: Negative for depression.    DRUG ALLERGIES:   Allergies  Allergen Reactions  . Morphine And Related Nausea And Vomiting  . Vicodin [Hydrocodone-Acetaminophen] Nausea And Vomiting  . Buprenorphine Hcl Nausea And Vomiting  . Codeine Nausea And Vomiting    VITALS:  Blood pressure 178/67, pulse 78, temperature 97.8 F (36.6 C), temperature source Oral, resp. rate 18, height 5\' 3"  (1.6 m), weight 83.6 kg (184 lb 4.9 oz), SpO2 100 %.  PHYSICAL EXAMINATION:  Physical Exam  GENERAL:  55 y.o.-year-old Disheveled appearing patient lying in the bed with no acute distress.  EYES: Pupils equal, round, reactive to light and accommodation. No scleral icterus. Extraocular muscles intact.  HEENT: Head atraumatic, normocephalic. Oropharynx and nasopharynx clear.  NECK:   Supple, no jugular venous distention. No thyroid enlargement, no tenderness.  LUNGS: Normal breath sounds bilaterally, no wheezing, rales,rhonchi or crepitation. No use of accessory muscles of respiration. Bibasilar crackles noted CARDIOVASCULAR: S1, S2 normal. No murmurs, rubs, or gallops.  ABDOMEN: Soft, nontender, nondistended. Bowel sounds present. No organomegaly or mass.  EXTREMITIES: No pedal edema, cyanosis, or clubbing. Right forearm AV fistula NEUROLOGIC: Cranial nerves II through XII are intact. Following commands. Muscle strength 5/5 in all extremities. Sensation intact. Gait not checked.  PSYCHIATRIC: The patient is alert and oriented x 3.  SKIN: No obvious rash, lesion, or ulcer.    LABORATORY PANEL:   CBC  Recent Labs Lab 01/14/16 0434  WBC 9.4  HGB 9.1*  HCT 27.6*  PLT 249   ------------------------------------------------------------------------------------------------------------------  Chemistries   Recent Labs Lab 01/11/16 0950  01/13/16 0442  01/16/16 0229  NA 139  < > 141  < > 138  K 5.6*  < > 4.3  < > 4.0  CL 102  < > 101  < > 97*  CO2 21*  < > 30  < > 31  GLUCOSE 122*  < > 96  < > 161*  BUN 72*  < > 23*  < > 26*  CREATININE 12.40*  < > 6.13*  < > 5.93*  CALCIUM 8.4*  < > 8.4*  < > 9.2  MG  --   < > 2.1  --   --   AST 31  --   --   --   --   ALT 37  --   --   --   --   ALKPHOS 53  --   --   --   --  BILITOT 0.4  --   --   --   --   < > = values in this interval not displayed. ------------------------------------------------------------------------------------------------------------------  Cardiac Enzymes  Recent Labs Lab 01/16/16 0859  TROPONINI 0.06*   ------------------------------------------------------------------------------------------------------------------  RADIOLOGY:  Dg Chest Port 1 View  01/15/2016  CLINICAL DATA:  Acute respiratory failure EXAM: PORTABLE CHEST 1 VIEW COMPARISON:  01/13/2016 FINDINGS: There is a left IJ  catheter with tip in the projection of the SVC. There is moderate cardiac enlargement. Pulmonary vascular congestion is identified. No pleural effusion noted. IMPRESSION: 1. Cardiac enlargement and pulmonary vascular congestion. Electronically Signed   By: Kerby Moors M.D.   On: 01/15/2016 09:53    EKG:   Orders placed or performed during the hospital encounter of 01/10/16  . ED EKG  . ED EKG  . EKG 12-Lead  . EKG 12-Lead  . EKG 12-Lead  . EKG 12-Lead  . EKG 12-Lead  . EKG 12-Lead    ASSESSMENT AND PLAN:   55 year old female with past medical history significant for end-stage renal disease on and if Wednesday Friday hemodialysis, diabetes, hypertension, polycystic kidney disease, COPD and neuropathy presents to hospital secondary to nausea vomiting and respiratory failure.  * Acute hypoxic respiratory failure due to acute on chronic diastolic CHF- pulmonary edema on admission Improved. Now extubated. Continues to be on oxygen. 2 L Secondary to acute pulmonary edema and fluid overload. -Improved now Wean oxygen as tolerated. On 2l now  * Acute encephalopathy CT scan of the head showed nothing acute. from ICU delirium. Off precedex drip IMPROVED   * Anemia of chronic disease Monitor. epogen with dialysis.  * Accelerated hypertension On oral medications. Continue Norvasc, Lasix, metoprolol On avapro and clonidine added  * Diabetes mellitus Sliding scale insulin  * ESRD on MWF dialysis- per schedule Has right forearm AV fistula Appreciate nephrology consult  *DVT prophylaxis-on subcutaneous heparin   Physical therapy pending.    All the records are reviewed and case discussed with Care Management/Social Workerr. Management plans discussed with the patient, family and they are in agreement.  CODE STATUS: Full code  TOTAL TIME TAKING CARE OF THIS PATIENT: 37 minutes.   POSSIBLE D/C TOMORROW, DEPENDING ON CLINICAL CONDITION.   Gladstone Lighter M.D on  01/16/2016 at 2:37 PM  Between 7am to 6pm - Pager - [phone]  After 6pm go to [email or web address] - password [email or web address] [facility]  Office  [phone]  CC: Primary care physician; Elyse Jarvis, MD

## 2016-01-16 NOTE — Progress Notes (Signed)
Dialysis started 

## 2016-01-16 NOTE — Progress Notes (Signed)
PT Cancellation Note  Patient Details Name: Jasmine Holloway MRN: [id number] DOB: [birth year]   Cancelled Treatment:    Reason Eval/Treat Not Completed: Patient at procedure or test/unavailable.  Pt currently receiving HD. Will re-attempt at later time/date.    Jemel Ono 01/16/2016, 10:25 AM  Sante Biedermann, PTA

## 2016-01-16 NOTE — Progress Notes (Signed)
Speech Language Pathology Treatment: Dysphagia  Patient Details Name: Tyona Santiago MRN: [id number] DOB: 25-Dec-1960 Today's Date: 01/16/2016 Time: 0930-1010 SLP Time Calculation (min) (ACUTE ONLY): 40 min  Assessment / Plan / Recommendation Clinical Impression  Pt seen for trials of diet consistency upgrade this morning as dialysis was beginning. Pt alert and verbally conversive. Vocal quality remains min reduced post oral extubation. Pt appeared to adequately tolerate trials of mech soft foods w/ thin liquids w/ no overt s/s of aspiration noted. No significant oral phase deficits noted w/ increased textures; appropriate oral clearing noted b/t trials. Pt assisted in feeding self but required support.  Pt appears at reduced risk for aspiration w/ an upgraded diet consistency following general aspiration precautions. ST will f/u w/ toleration of diet and education as indicated while admitted. NSG and MD updated; agreed.    HPI HPI: Pt is a 55 year old African-American female with a past medical history of end-stage renal disease on hemodialysis, type 2 diabetes, hypertension, polycystic kidney disease, asthma, COPD, and diabetic neuropathy who presented to the ED with complaints of abdominal pain, nausea, vomiting and migraine headaches that started today. History is obtained from EMS and ED records as patient is currently intubated. Per ED records, patient stated that she skipped dialysis today because she was in so much pain. Described pain as mostly located around the central region of her abdomen, severe, constant, and has gotten worse over the last 8 to 9 days. She has a ventral hernia and has had abdominal surgery before. She did not relay any relieving or aggravating factors. She reported 3 bowel movements prior to ED arrival. She reports having dialysis on Wednesday. She is also complaining of dyspnea that is mildly worse with exertion. At the ED, patient became very anxious, and her respirations  became labored with worsening shortness of breath. Her chest x-ray showed pulmonary edema and when she was placed on nasal cannula, patient kept complaining that she could not breath hence the ED MD intubated patient emergently and PCCM was consulted for admission. Patient is now intubated and sedated. Heart blood pressure is still elevated with systolic in the 0000000. Pt was intubated ~2 days. She is extubated now w/ vocal quality c/b hoarseness and low volume(slightly improved from yesterday). Pt continues to have some confusion. Pt has toleated her current rec'd diet of Dys. 1 w/ thin liquids.       SLP Plan  Continue with current plan of care     Recommendations  Diet recommendations: Dysphagia 3 (mechanical soft);Thin liquid Liquids provided via: Straw;Cup Medication Administration: Whole meds with puree Supervision: Staff to assist with self feeding;Full supervision/cueing for compensatory strategies Compensations: Minimize environmental distractions;Slow rate;Small sips/bites;Lingual sweep for clearance of pocketing;Follow solids with liquid Postural Changes and/or Swallow Maneuvers: Seated upright 90 degrees;Upright 30-60 min after meal             Oral Care Recommendations: Oral care BID;Staff/trained caregiver to provide oral care Follow up Recommendations: None Plan: Continue with current plan of care     Pella, [place], CCC-SLP  Watson,Katherine 01/16/2016, 3:56 PM

## 2016-01-17 LAB — GLUCOSE, CAPILLARY
GLUCOSE-CAPILLARY: 107 mg/dL — AB (ref 65–99)
GLUCOSE-CAPILLARY: 73 mg/dL (ref 65–99)
Glucose-Capillary: 115 mg/dL — ABNORMAL HIGH (ref 65–99)
Glucose-Capillary: 89 mg/dL (ref 65–99)
Glucose-Capillary: 85 mg/dL (ref 65–99)

## 2016-01-17 LAB — BASIC METABOLIC PANEL
Anion gap: 7 (ref 5–15)
BUN: 26 mg/dL — AB (ref 6–20)
CHLORIDE: 100 mmol/L — AB (ref 101–111)
CO2: 32 mmol/L (ref 22–32)
CREATININE: 5.45 mg/dL — AB (ref 0.44–1.00)
Calcium: 9.2 mg/dL (ref 8.9–10.3)
GFR calc Af Amer: 9 mL/min — ABNORMAL LOW (ref >60)
GFR calc non Af Amer: 8 mL/min — ABNORMAL LOW (ref >60)
Glucose, Bld: 116 mg/dL — ABNORMAL HIGH (ref 65–99)
POTASSIUM: 4.6 mmol/L (ref 3.5–5.1)
Sodium: 139 mmol/L (ref 135–145)

## 2016-01-17 LAB — URINE CULTURE
CULTURE: NO GROWTH
Special Requests: NORMAL

## 2016-01-17 LAB — CULTURE, BLOOD (ROUTINE X 2)

## 2016-01-17 LAB — HEMOGLOBIN AND HEMATOCRIT, BLOOD
HEMATOCRIT: 29.6 % — AB (ref 35.0–47.0)
Hemoglobin: 9.5 g/dL — ABNORMAL LOW (ref 12.0–16.0)

## 2016-01-17 MED ORDER — AMOXICILLIN-POT CLAVULANATE 500-125 MG PO TABS: 1.0000 | ORAL_TABLET | ORAL | Status: DC

## 2016-01-17 MED ORDER — IRBESARTAN 150 MG PO TABS: 150.0000 mg | ORAL_TABLET | Freq: Every day | ORAL | Status: DC

## 2016-01-17 MED ORDER — CLONIDINE HCL 0.1 MG PO TABS: 0.1000 mg | ORAL_TABLET | Freq: Two times a day (BID) | ORAL | Status: DC

## 2016-01-17 MED ORDER — ALPRAZOLAM 0.5 MG PO TABS: 0.5000 mg | ORAL_TABLET | Freq: Three times a day (TID) | ORAL | Status: DC | PRN

## 2016-01-17 MED ORDER — OXYCODONE-ACETAMINOPHEN 7.5-325 MG PO TABS: 1.0000 | ORAL_TABLET | Freq: Three times a day (TID) | ORAL | Status: DC | PRN

## 2016-01-17 MED ORDER — AMLODIPINE BESYLATE 10 MG PO TABS: 10.0000 mg | ORAL_TABLET | Freq: Every day | ORAL | Status: DC

## 2016-01-17 MED ORDER — ASPIRIN 81 MG PO CHEW: 81.0000 mg | CHEWABLE_TABLET | Freq: Every day | ORAL | Status: DC

## 2016-01-17 MED ORDER — PHENOL 1.4 % MT LIQD: 1.0000 | OROMUCOSAL | Status: DC | PRN

## 2016-01-17 NOTE — Progress Notes (Signed)
Patient is transferred from ICU. Alert and oriented x 4.  Denied any acute pain. Sitter at bedside. No acute respiratory distress noted. Tele box called to CCMD by the RN and Daniell NT. No skin issues of concern noted. Patient oriented to the room, call bell/ascom and staff. Will continue to monitor.

## 2016-01-17 NOTE — Progress Notes (Signed)
PRE DIALYSIS ASSESSMENT 

## 2016-01-17 NOTE — Progress Notes (Signed)
Central line removed per protocol. Patient laying flat until 1745 and aware. Tolerating well.

## 2016-01-17 NOTE — Discharge Instructions (Signed)
Heart Failure Clinic appointment on February 04, 2016 at 12:30pm with [name], [place]. Please call [phone] to reschedule.

## 2016-01-17 NOTE — Progress Notes (Signed)
STARTED HEMODIALYSIS

## 2016-01-17 NOTE — Discharge Summary (Signed)
[facility] NAME: Jasmine Holloway    MR#:  [id number]  DATE OF BIRTH:  [birth year]  DATE OF ADMISSION:  01/10/2016 ADMITTING PHYSICIAN: Gladstone Lighter, MD  DATE OF DISCHARGE: 01/17/16  PRIMARY CARE PHYSICIAN: Elyse Jarvis, MD    ADMISSION DIAGNOSIS:  Acute pulmonary edema (HCC) [J81.0] Dyspnea [R06.00] ESRD on dialysis ([place]) [N18.6, Z99.2] Acute respiratory failure with hypoxia and hypercapnia (HCC) [J96.01, J96.02] Other hypervolemia [E87.79]  DISCHARGE DIAGNOSIS:  Active Problems:   Acute respiratory failure with hypoxia (HCC)   Abdominal pain   Acute pulmonary edema ([place])   SECONDARY DIAGNOSIS:   Past Medical History  Diagnosis Date  . ESRD (end stage renal disease) on dialysis ([place])   . Hypertension   . Polycystic kidney disease   . Asthma   . COPD (chronic obstructive pulmonary disease) ([place])   . Diabetes mellitus without complication ([place])   . Neuropathy ([place])   . Renal insufficiency     HOSPITAL COURSE:   55 year old female with past medical history significant for end-stage renal disease on and if Wednesday Friday hemodialysis, diabetes, hypertension, polycystic kidney disease, COPD and neuropathy presents to hospital secondary to nausea vomiting and respiratory failure.  * Acute hypoxic respiratory failure due to acute on chronic diastolic CHF- pulmonary edema on admission Improved. Now extubated. Weaned off nasal cannula as well  Secondary to acute pulmonary edema and fluid overload. -Improved after dialysis  * Acute encephalopathy CT scan of the head showed nothing acute. from ICU delirium. Resolved and back to baseline.   * Anemia of chronic disease Monitor. epogen with dialysis.  * Accelerated hypertension On oral medications. Continue Norvasc, Lasix, metoprolol On avapro and clonidine added  * ESRD on MWF dialysis-Dialysis today per schedule Has right forearm AV fistula Appreciate  nephrology consult  *Positive blood cultures with micrococcus species on admission-with elevated pro calcitonin. -Started on Augmentin by intensive care physicians. Continue to finish off the course for a total of 7 days. -Repeat blood cultures have remained negative  Patient worked with physical therapy today. She ambulated with minimal assistance. Will be discharged home with home health.  DISCHARGE CONDITIONS:   Stable  CONSULTS OBTAINED:  Treatment Team:  Lavonia Dana, MD  DRUG ALLERGIES:   Allergies  Allergen Reactions  . Morphine And Related Nausea And Vomiting  . Vicodin [Hydrocodone-Acetaminophen] Nausea And Vomiting  . Buprenorphine Hcl Nausea And Vomiting  . Codeine Nausea And Vomiting    DISCHARGE MEDICATIONS:   Current Discharge Medication List    START taking these medications   Details  ALPRAZolam (XANAX) 0.5 MG tablet Take 1 tablet (0.5 mg total) by mouth 3 (three) times daily as needed for anxiety. Qty: 20 tablet, Refills: 0    amoxicillin-clavulanate (AUGMENTIN) 500-125 MG tablet Take 1 tablet (500 mg total) by mouth daily. X 3 more days Qty: 3 tablet, Refills: 0    aspirin 81 MG chewable tablet Chew 1 tablet (81 mg total) by mouth daily. Qty: 30 tablet, Refills: 2    cloNIDine (CATAPRES) 0.1 MG tablet Take 1 tablet (0.1 mg total) by mouth 2 (two) times daily. Qty: 60 tablet, Refills: 2    irbesartan (AVAPRO) 150 MG tablet Take 1 tablet (150 mg total) by mouth at bedtime. Qty: 30 tablet, Refills: 2    oxyCODONE-acetaminophen (PERCOCET) 7.5-325 MG tablet Take 1 tablet by mouth every 8 (eight) hours as needed for moderate pain. Qty: 20 tablet, Refills: 0    phenol (CHLORASEPTIC)  1.4 % LIQD Use as directed 1 spray in the mouth or throat as needed for throat irritation / pain. Qty: 20 mL, Refills: 0      CONTINUE these medications which have CHANGED   Details  amLODipine (NORVASC) 10 MG tablet Take 1 tablet (10 mg total) by mouth at  bedtime. Qty: 30 tablet, Refills: 2      CONTINUE these medications which have NOT CHANGED   Details  albuterol (PROVENTIL HFA;VENTOLIN HFA) 108 (90 Base) MCG/ACT inhaler Inhale 2 puffs into the lungs every 6 (six) hours as needed for wheezing or shortness of breath.    b complex-vitamin c-folic acid (NEPHRO-VITE) 0.8 MG TABS tablet Take 1 tablet by mouth at bedtime.    calcium acetate (PHOSLO) 667 MG capsule Take 667 mg by mouth 3 (three) times daily with meals.     cinacalcet (SENSIPAR) 30 MG tablet Take 30 mg by mouth daily.    furosemide (LASIX) 80 MG tablet Take 80 mg by mouth daily.     gabapentin (NEURONTIN) 300 MG capsule Take 300 mg by mouth 2 (two) times daily.     metoprolol succinate (TOPROL-XL) 50 MG 24 hr tablet Take 50 mg by mouth daily. Take with or immediately following a meal.    omeprazole (PRILOSEC) 20 MG capsule Take 40 mg by mouth daily.     rosuvastatin (CRESTOR) 5 MG tablet Take 5 mg by mouth daily.      STOP taking these medications     oxyCODONE-acetaminophen (PERCOCET) 10-325 MG tablet          DISCHARGE INSTRUCTIONS:   1. For dialysis on Monday per schedule 2. PCP f/u in 1-2 weeks  If you experience worsening of your admission symptoms, develop shortness of breath, life threatening emergency, suicidal or homicidal thoughts you must seek medical attention immediately by calling 911 or calling your MD immediately  if symptoms less severe.  You Must read complete instructions/literature along with all the possible adverse reactions/side effects for all the Medicines you take and that have been prescribed to you. Take any new Medicines after you have completely understood and accept all the possible adverse reactions/side effects.   Please note  You were cared for by a hospitalist during your hospital stay. If you have any questions about your discharge medications or the care you received while you were in the hospital after you are discharged, you  can call the unit and asked to speak with the hospitalist on call if the hospitalist that took care of you is not available. Once you are discharged, your primary care physician will handle any further medical issues. Please note that NO REFILLS for any discharge medications will be authorized once you are discharged, as it is imperative that you return to your primary care physician (or establish a relationship with a primary care physician if you do not have one) for your aftercare needs so that they can reassess your need for medications and monitor your lab values.    Today   CHIEF COMPLAINT:   Chief Complaint  Patient presents with  . Abdominal Pain    VITAL SIGNS:  Blood pressure 165/99, pulse 71, temperature 97.8 F (36.6 C), temperature source Oral, resp. rate 20, height 5\' 3"  (1.6 m), weight 86.2 kg (190 lb 0.6 oz), SpO2 99 %.  I/O:   Intake/Output Summary (Last 24 hours) at 01/17/16 1541 Last data filed at 01/17/16 1459  Gross per 24 hour  Intake      0  ml  Output    400 ml  Net   -400 ml    PHYSICAL EXAMINATION:   Physical Exam  GENERAL: 55 y.o.-year-old patient sitting in the bed with no acute distress.  EYES: Pupils equal, round, reactive to light and accommodation. No scleral icterus. Extraocular muscles intact.  HEENT: Head atraumatic, normocephalic. Oropharynx and nasopharynx clear.  NECK: Supple, no jugular venous distention. No thyroid enlargement, no tenderness.  LUNGS: Normal breath sounds bilaterally, no wheezing, rales,rhonchi or crepitation. No use of accessory muscles of respiration.  CARDIOVASCULAR: S1, S2 normal. No murmurs, rubs, or gallops.  ABDOMEN: Soft, nontender, nondistended. Bowel sounds present. No organomegaly or mass.  EXTREMITIES: No pedal edema, cyanosis, or clubbing. Right forearm AV fistula NEUROLOGIC: Cranial nerves II through XII are intact. Following commands. Muscle strength 5/5 in all extremities. Sensation intact. Gait not  checked.  PSYCHIATRIC: The patient is alert and oriented x 3.  SKIN: No obvious rash, lesion, or ulcer.    DATA REVIEW:   CBC  Recent Labs Lab 01/14/16 0434 01/17/16 1434  WBC 9.4  --   HGB 9.1* 9.5*  HCT 27.6* 29.6*  PLT 249  --     Chemistries   Recent Labs Lab 01/11/16 0950  01/13/16 0442  01/17/16 0555  NA 139  < > 141  < > 139  K 5.6*  < > 4.3  < > 4.6  CL 102  < > 101  < > 100*  CO2 21*  < > 30  < > 32  GLUCOSE 122*  < > 96  < > 116*  BUN 72*  < > 23*  < > 26*  CREATININE 12.40*  < > 6.13*  < > 5.45*  CALCIUM 8.4*  < > 8.4*  < > 9.2  MG  --   < > 2.1  --   --   AST 31  --   --   --   --   ALT 37  --   --   --   --   ALKPHOS 53  --   --   --   --   BILITOT 0.4  --   --   --   --   < > = values in this interval not displayed.  Cardiac Enzymes  Recent Labs Lab 01/16/16 0859  TROPONINI 0.06*    Microbiology Results  Results for orders placed or performed during the hospital encounter of 01/10/16  Urine culture     Status: Abnormal   Collection Time: 01/10/16  1:08 PM  Result Value Ref Range Status   Specimen Description URINE, CLEAN CATCH  Final   Special Requests NONE  Final   Culture MULTIPLE SPECIES PRESENT, SUGGEST RECOLLECTION (A)  Final   Report Status 01/12/2016 FINAL  Final  Culture, blood (routine x 2)     Status: None   Collection Time: 01/10/16  9:39 PM  Result Value Ref Range Status   Specimen Description BLOOD LEFT ASSIST CONTROL  Final   Special Requests   Final    BOTTLES DRAWN AEROBIC AND ANAEROBIC 12CCAERO,10CCANA   Culture NO GROWTH 6 DAYS  Final   Report Status 01/16/2016 FINAL  Final  Culture, blood (routine x 2)     Status: Abnormal   Collection Time: 01/10/16 11:13 PM  Result Value Ref Range Status   Specimen Description BLOOD LEFT HAND  Final   Special Requests BOTTLES DRAWN AEROBIC AND ANAEROBIC [place]  Final   Culture  Setup Time  Final    GRAM POSITIVE COCCI AEROBIC BOTTLE ONLY CRITICAL RESULT CALLED TO,  READ BACK BY AND VERIFIED WITH: CHRISTINE KATSOUDAS ON 01/15/16 AT 0724 BY TLB CONFIRMED BY TLB/SGD Organism ID to follow    Culture (A)  Final    MICROCOCCUS SPECIES Standardized susceptibility testing for this organism is not available. Performed at [facility]    Report Status 01/17/2016 FINAL  Final  MRSA PCR Screening     Status: None   Collection Time: 01/10/16 11:13 PM  Result Value Ref Range Status   MRSA by PCR NEGATIVE NEGATIVE Final    Comment:        The GeneXpert MRSA Assay (FDA approved for NASAL specimens only), is one component of a comprehensive MRSA colonization surveillance program. It is not intended to diagnose MRSA infection nor to guide or monitor treatment for MRSA infections.   Blood Culture ID Panel (Reflexed)     Status: None   Collection Time: 01/10/16 11:13 PM  Result Value Ref Range Status   Enterococcus species NOT DETECTED NOT DETECTED Final   Vancomycin resistance NOT DETECTED NOT DETECTED Final   Listeria monocytogenes NOT DETECTED NOT DETECTED Final   Staphylococcus species NOT DETECTED NOT DETECTED Final   Staphylococcus aureus NOT DETECTED NOT DETECTED Final   Methicillin resistance NOT DETECTED NOT DETECTED Final   Streptococcus species NOT DETECTED NOT DETECTED Final   Streptococcus agalactiae NOT DETECTED NOT DETECTED Final   Streptococcus pneumoniae NOT DETECTED NOT DETECTED Final   Streptococcus pyogenes NOT DETECTED NOT DETECTED Final   Acinetobacter baumannii NOT DETECTED NOT DETECTED Final   Enterobacteriaceae species NOT DETECTED NOT DETECTED Final   Enterobacter cloacae complex NOT DETECTED NOT DETECTED Final   Escherichia coli NOT DETECTED NOT DETECTED Final   Klebsiella oxytoca NOT DETECTED NOT DETECTED Final   Klebsiella pneumoniae NOT DETECTED NOT DETECTED Final   Proteus species NOT DETECTED NOT DETECTED Final   Serratia marcescens NOT DETECTED NOT DETECTED Final   Carbapenem resistance NOT DETECTED NOT DETECTED  Final   Haemophilus influenzae NOT DETECTED NOT DETECTED Final   Neisseria meningitidis NOT DETECTED NOT DETECTED Final   Pseudomonas aeruginosa NOT DETECTED NOT DETECTED Final   Candida albicans NOT DETECTED NOT DETECTED Final   Candida glabrata NOT DETECTED NOT DETECTED Final   Candida krusei NOT DETECTED NOT DETECTED Final   Candida parapsilosis NOT DETECTED NOT DETECTED Final   Candida tropicalis NOT DETECTED NOT DETECTED Final  Culture, Urine     Status: None   Collection Time: 01/14/16  3:06 PM  Result Value Ref Range Status   Specimen Description URINE, RANDOM  Final   Special Requests Normal  Final   Culture NO GROWTH Performed at [facility]   Final   Report Status 01/17/2016 FINAL  Final  Culture, blood (Routine X 2) w Reflex to ID Panel     Status: None (Preliminary result)   Collection Time: 01/15/16  9:47 AM  Result Value Ref Range Status   Specimen Description BLOOD LEFT HAND  Final   Special Requests   Final    BOTTLES DRAWN AEROBIC AND ANAEROBIC AER 2ML ANA 1ML   Culture NO GROWTH 2 DAYS  Final   Report Status PENDING  Incomplete  Culture, blood (Routine X 2) w Reflex to ID Panel     Status: None (Preliminary result)   Collection Time: 01/15/16 10:11 AM  Result Value Ref Range Status   Specimen Description BLOOD LEFT HAND  Final  Special Requests   Final    BOTTLES DRAWN AEROBIC AND ANAEROBIC  AER 2ML ANA 1ML   Culture NO GROWTH 2 DAYS  Final   Report Status PENDING  Incomplete    RADIOLOGY:  No results found.  EKG:   Orders placed or performed during the hospital encounter of 01/10/16  . ED EKG  . ED EKG  . EKG 12-Lead  . EKG 12-Lead  . EKG 12-Lead  . EKG 12-Lead  . EKG 12-Lead  . EKG 12-Lead      Management plans discussed with the patient, family and they are in agreement.  CODE STATUS:     Code Status Orders        Start     Ordered   01/10/16 2106  Full code   Continuous     01/10/16 2111    Code Status History    Date  Active Date Inactive Code Status Order ID Comments User Context   12/21/2015  6:08 AM 12/22/2015  4:09 PM Full Code [id number]  Saundra Shelling, MD Inpatient   02/24/2015  3:52 AM 02/24/2015  4:15 PM Full Code [id number]  Harrie Foreman, MD Inpatient      TOTAL TIME TAKING CARE OF THIS PATIENT: 37 minutes.    Gladstone Lighter M.D on 01/17/2016 at 3:41 PM  Between 7am to 6pm - Pager - [phone]  After 6pm go to [email or web address] - password [email or web address] [facility]  Office  [phone]  CC: Primary care physician; Elyse Jarvis, MD

## 2016-01-17 NOTE — Progress Notes (Signed)
Patient is alert and oriented x 4. No acute respiratory distress noted. No agitation or pulling of IV noted. New order received from Dr. Estanislado Pandy to change the continues bedside sitter order  to as needed.  Will continue to monitor.

## 2016-01-17 NOTE — Care Management (Signed)
Patient is alert and oriented and appropriate this morning which is a marked improvement from last encounter.  Physical therapy has assessed and patient is able to return home now with home health.  patient is in agreement.  No agency preference.  Referral to [facility].

## 2016-01-17 NOTE — Progress Notes (Signed)
Pt refused to wait for RN to remove IV and review discharge instructions.  NT attempted to find patient enroute to exit and in in parking lot to no avail.  Prior to leaving pt was noted to be in NAD, skin warm and dry, respirations even and unlabored.

## 2016-01-17 NOTE — Progress Notes (Signed)
PRD DIALYSIS ASSESSMENT

## 2016-01-17 NOTE — Progress Notes (Signed)
Post dialysis assessment 

## 2016-01-17 NOTE — Progress Notes (Signed)
Initial Heart Failure Clinic appointment scheduled for February 04, 2016 at 12:30pm. Thank you.

## 2016-01-17 NOTE — Progress Notes (Signed)
Patient expressing reluctance on waiting for dialysis, reports she is just ready to "go home." Patient reports she made an appointment with her dialysis office tomorrow because she wants to go home now. RN provided education on necessity of dialysis and that patient does not need to be in the position she was in when she first got to the hospital. Both Dr. Tressia Miners and Dr. Candiss Norse notified and agreed that patient needs to have dialysis before leaving. Patient, after a lengthy period, agreed and consented to having dialysis here at the hospital prior to discharge.

## 2016-01-17 NOTE — Care Management Important Message (Signed)
Important Message  Patient Details  Name: Jasmine Holloway MRN: [id number] Date of Birth: [birth year]   Medicare Important Message Given:  Yes    Katrina Stack, RN 01/17/2016, 9:02 AM

## 2016-01-17 NOTE — Progress Notes (Signed)
End dialysis treatment per patient request

## 2016-01-17 NOTE — Progress Notes (Signed)
Subjective:   Mental status is better today Appears to be back to baseline Sister at bedside Patient denies any acute complaints Breathing is better and back to baseline     Objective:  Vital signs in last 24 hours:  Temp:  [97.8 F (36.6 C)-98.6 F (37 C)] 97.8 F (36.6 C) (06/09 1445) Pulse Rate:  [69-88] 71 (06/09 1500) Resp:  [17-24] 18 (06/09 1600) BP: (120-221)/(56-116) 133/98 mmHg (06/09 1600) SpO2:  [89 %-100 %] 99 % (06/09 1203) Weight:  [84.46 kg (186 lb 3.2 oz)-86.2 kg (190 lb 0.6 oz)] 86.2 kg (190 lb 0.6 oz) (06/09 1430)  Weight change: -1 kg (-2 lb 3.3 oz) Filed Weights   01/16/16 1257 01/17/16 0208 01/17/16 1430  Weight: 83.6 kg (184 lb 4.9 oz) 84.46 kg (186 lb 3.2 oz) 86.2 kg (190 lb 0.6 oz)    Intake/Output:    Intake/Output Summary (Last 24 hours) at 01/17/16 1613 Last data filed at 01/17/16 1459  Gross per 24 hour  Intake      0 ml  Output    400 ml  Net   -400 ml     Physical Exam: General: Chronically ill appearing  HEENT Anicteric, moist oral mucus membranes  Neck supple  Pulm/lungs Clear today, improved  CVS/Heart regular  Abdomen:  Soft, nontender  Extremities: +trace edema  Neurologic: Awake, alert, following simple commands  Skin: No acute rashes  Access: AVG- rt arm       Basic Metabolic Panel:   Recent Labs Lab 01/11/16 0331  01/12/16 0542 01/13/16 0442 01/14/16 0434 01/15/16 0503 01/16/16 0229 01/16/16 0859 01/17/16 0555  NA 135  < > 138 141 142 140 138  --  139  K 7.5*  < > 4.2 4.3 4.2 3.8 4.0  --  4.6  CL 102  < > 99* 101 103 99* 97*  --  100*  CO2 18*  < > 29 30 28 27 31   --  32  GLUCOSE 118*  < > 119* 96 81 98 161*  --  116*  BUN 86*  < > 41* 23* 20 41* 26*  --  26*  CREATININE 15.75*  < > 8.63* 6.13* 5.58* 8.12* 5.93*  --  5.45*  CALCIUM 7.8*  < > 8.0* 8.4* 8.7* 9.2 9.2  --  9.2  MG 2.2  --  2.0 2.1  --   --   --   --   --   PHOS 8.6*  --  6.9* 6.6*  --   --   --  6.0*  --   < > = values in this interval  not displayed.   CBC:  Recent Labs Lab 01/11/16 0331 01/11/16 0950 01/12/16 0542 01/12/16 0815 01/13/16 0442 01/14/16 0434 01/17/16 1434  WBC 5.8 4.6 7.5  --  9.9 9.4  --   NEUTROABS  --   --   --   --   --  6.6*  --   HGB 8.1* 8.9* 7.3* 7.2* 8.7* 9.1* 9.5*  HCT 24.3* 26.9* 22.0*  --  27.1* 27.6* 29.6*  MCV 89.0 88.0 88.4  --  88.3 88.4  --   PLT 264 302 242  --  245 249  --       Microbiology:  Recent Results (from the past 720 hour(s))  MRSA PCR Screening     Status: None   Collection Time: 12/21/15  6:10 AM  Result Value Ref Range Status   MRSA by PCR NEGATIVE NEGATIVE Final  Comment:        The GeneXpert MRSA Assay (FDA approved for NASAL specimens only), is one component of a comprehensive MRSA colonization surveillance program. It is not intended to diagnose MRSA infection nor to guide or monitor treatment for MRSA infections.   Urine culture     Status: Abnormal   Collection Time: 01/10/16  1:08 PM  Result Value Ref Range Status   Specimen Description URINE, CLEAN CATCH  Final   Special Requests NONE  Final   Culture MULTIPLE SPECIES PRESENT, SUGGEST RECOLLECTION (A)  Final   Report Status 01/12/2016 FINAL  Final  Culture, blood (routine x 2)     Status: None   Collection Time: 01/10/16  9:39 PM  Result Value Ref Range Status   Specimen Description BLOOD LEFT ASSIST CONTROL  Final   Special Requests   Final    BOTTLES DRAWN AEROBIC AND ANAEROBIC 12CCAERO,10CCANA   Culture NO GROWTH 6 DAYS  Final   Report Status 01/16/2016 FINAL  Final  Culture, blood (routine x 2)     Status: Abnormal   Collection Time: 01/10/16 11:13 PM  Result Value Ref Range Status   Specimen Description BLOOD LEFT HAND  Final   Special Requests BOTTLES DRAWN AEROBIC AND ANAEROBIC 5CCAERO,5CCANA  Final   Culture  Setup Time   Final    GRAM POSITIVE COCCI AEROBIC BOTTLE ONLY CRITICAL RESULT CALLED TO, READ BACK BY AND VERIFIED WITH: CHRISTINE KATSOUDAS ON 01/15/16 AT 0724 BY  TLB CONFIRMED BY TLB/SGD Organism ID to follow    Culture (A)  Final    MICROCOCCUS SPECIES Standardized susceptibility testing for this organism is not available. Performed at [facility]    Report Status 01/17/2016 FINAL  Final  MRSA PCR Screening     Status: None   Collection Time: 01/10/16 11:13 PM  Result Value Ref Range Status   MRSA by PCR NEGATIVE NEGATIVE Final    Comment:        The GeneXpert MRSA Assay (FDA approved for NASAL specimens only), is one component of a comprehensive MRSA colonization surveillance program. It is not intended to diagnose MRSA infection nor to guide or monitor treatment for MRSA infections.   Blood Culture ID Panel (Reflexed)     Status: None   Collection Time: 01/10/16 11:13 PM  Result Value Ref Range Status   Enterococcus species NOT DETECTED NOT DETECTED Final   Vancomycin resistance NOT DETECTED NOT DETECTED Final   Listeria monocytogenes NOT DETECTED NOT DETECTED Final   Staphylococcus species NOT DETECTED NOT DETECTED Final   Staphylococcus aureus NOT DETECTED NOT DETECTED Final   Methicillin resistance NOT DETECTED NOT DETECTED Final   Streptococcus species NOT DETECTED NOT DETECTED Final   Streptococcus agalactiae NOT DETECTED NOT DETECTED Final   Streptococcus pneumoniae NOT DETECTED NOT DETECTED Final   Streptococcus pyogenes NOT DETECTED NOT DETECTED Final   Acinetobacter baumannii NOT DETECTED NOT DETECTED Final   Enterobacteriaceae species NOT DETECTED NOT DETECTED Final   Enterobacter cloacae complex NOT DETECTED NOT DETECTED Final   Escherichia coli NOT DETECTED NOT DETECTED Final   Klebsiella oxytoca NOT DETECTED NOT DETECTED Final   Klebsiella pneumoniae NOT DETECTED NOT DETECTED Final   Proteus species NOT DETECTED NOT DETECTED Final   Serratia marcescens NOT DETECTED NOT DETECTED Final   Carbapenem resistance NOT DETECTED NOT DETECTED Final   Haemophilus influenzae NOT DETECTED NOT DETECTED Final    Neisseria meningitidis NOT DETECTED NOT DETECTED Final   Pseudomonas aeruginosa NOT DETECTED  NOT DETECTED Final   Candida albicans NOT DETECTED NOT DETECTED Final   Candida glabrata NOT DETECTED NOT DETECTED Final   Candida krusei NOT DETECTED NOT DETECTED Final   Candida parapsilosis NOT DETECTED NOT DETECTED Final   Candida tropicalis NOT DETECTED NOT DETECTED Final  Culture, Urine     Status: None   Collection Time: 01/14/16  3:06 PM  Result Value Ref Range Status   Specimen Description URINE, RANDOM  Final   Special Requests Normal  Final   Culture NO GROWTH Performed at [facility]   Final   Report Status 01/17/2016 FINAL  Final  Culture, blood (Routine X 2) w Reflex to ID Panel     Status: None (Preliminary result)   Collection Time: 01/15/16  9:47 AM  Result Value Ref Range Status   Specimen Description BLOOD LEFT HAND  Final   Special Requests   Final    BOTTLES DRAWN AEROBIC AND ANAEROBIC AER 2ML ANA 1ML   Culture NO GROWTH 2 DAYS  Final   Report Status PENDING  Incomplete  Culture, blood (Routine X 2) w Reflex to ID Panel     Status: None (Preliminary result)   Collection Time: 01/15/16 10:11 AM  Result Value Ref Range Status   Specimen Description BLOOD LEFT HAND  Final   Special Requests   Final    BOTTLES DRAWN AEROBIC AND ANAEROBIC  AER 2ML ANA 1ML   Culture NO GROWTH 2 DAYS  Final   Report Status PENDING  Incomplete    Coagulation Studies: No results for input(s): LABPROT, INR in the last 72 hours.  Urinalysis: No results for input(s): COLORURINE, LABSPEC, PHURINE, GLUCOSEU, HGBUR, BILIRUBINUR, KETONESUR, PROTEINUR, UROBILINOGEN, NITRITE, LEUKOCYTESUR in the last 72 hours.  Invalid input(s): APPERANCEUR    Imaging: No results found.   Medications:     . ALPRAZolam  0.5 mg Oral TID  . amLODipine  10 mg Oral QHS  . amoxicillin-clavulanate  1 tablet Oral Q24H  . aspirin  81 mg Oral Daily  . budesonide (PULMICORT) nebulizer solution  0.25 mg  Nebulization BID  . calcium acetate  1,334 mg Oral TID WC  . cinacalcet  30 mg Oral Q supper  . cloNIDine  0.1 mg Oral BID  . epoetin (EPOGEN/PROCRIT) injection  10,000 Units Intravenous Q M,W,F-HD  . famotidine  10 mg Oral Daily  . feeding supplement (NEPRO CARB STEADY)  237 mL Oral BID BM  . furosemide  80 mg Oral Daily  . heparin  5,000 Units Subcutaneous Q8H  . insulin aspart  0-9 Units Subcutaneous Q4H  . irbesartan  150 mg Oral QHS  . metoprolol succinate  50 mg Oral Daily  . rosuvastatin  5 mg Oral q1800   sodium chloride, acetaminophen, albuterol, bisacodyl, haloperidol lactate, hydrALAZINE, HYDROmorphone (DILAUDID) injection, ipratropium-albuterol, labetalol, LORazepam, ondansetron (ZOFRAN) IV, oxyCODONE-acetaminophen, phenol, sennosides, sodium chloride  Assessment/ Plan:  55 y.o.black female with ESRD secondary to polycystic kidney hypertension, GERD, peritoneal dialysis h/o peritonitis, arthritis, chronic left hand pain, ruptured access   MWF 3rd shift, [facility] [address]   1. End Stage Renal Disease: with hyperkalemia and metabolic acidosis: emergent hemodialysis yesterday for hyperkalemia and pulmonary edema. Also received IV contrast in ED. Patient seen during dialysis Tolerating well   2. Acute Respiratory failure with Acute pulmonary edema:required mechanical ventilation - extubated now - doing well  3. Anemia of chronic kidney disease: hemogobin 9.5 - EPO with MWF schedule  4. Secondary Hyperparathyroidism: hyperphosphatemia as outpatient with  phos of 6.0 - restart cinacalcet,  - restart binders   LOS:  Jessey Stehlin 6/9/20174:13 PM

## 2016-01-17 NOTE — Progress Notes (Addendum)
Physical Therapy Treatment Patient Details Name: Jasmine Holloway MRN: [id number] DOB: [birth year] Today's Date: 01/17/2016    History of Present Illness 55 yo F presented to ED on 6/2 for abdominal pain, SOB, and AMS found to have acute respiratory failure with hypoxia. She was intubated after admission and extubated on 6/4, now on 2L O2. Pt has ESRD and is on dialysis. PMH includes polycystic kidney disease, COPD, DM, neuropathy, R AV graft. She has been highly agitated since admission and treated with medication and has a Engineer, materials.    PT Comments    Pt demonstrated significant progress towards functional goals. She is back to her baseline cognitive function, very pleasant and able to participate in therapy. Pt independent with bed mobility. She requires min guard for transfers and ambulation up to 250 ft with SPC. SpO2 ranged 89 to 94% on room air. Desat when pt talked while walking. Improved quickly with pursed lip breathing. Due to pt's significant improvements, discharge plan updated to HHPT and use of SPC.   Follow Up Recommendations  Home health PT     Equipment Recommendations  Cane    Recommendations for Other Services       Precautions / Restrictions Restrictions Weight Bearing Restrictions: No    Mobility  Bed Mobility Overal bed mobility: Independent Bed Mobility: Supine to Sit              Transfers Overall transfer level: Needs assistance Equipment used: None Transfers: Sit to/from Stand;Stand Pivot Transfers Sit to Stand: Min guard Stand pivot transfers: Min guard       General transfer comment: steady with no LOB  Ambulation/Gait Ambulation/Gait assistance: Min guard Ambulation Distance (Feet): 250 Feet Assistive device: None;Straight cane Gait Pattern/deviations: Scissoring;Narrow base of support Gait velocity: WFL   General Gait Details: mildly unsteady without AD, with SPC pt has improved stability, good speed   Stairs Stairs: Yes Stairs  assistance: Min guard Stair Management: One rail Left;With cane Number of Stairs: 3 General stair comments: stable with no LOB  Wheelchair Mobility    Modified Rankin (Stroke Patients Only)       Balance Overall balance assessment: Needs assistance Sitting-balance support: No upper extremity supported Sitting balance-Leahy Scale: Normal     Standing balance support: Single extremity supported Standing balance-Leahy Scale: Good Standing balance comment: maintains with no LOB                    Cognition Arousal/Alertness: Awake/alert Behavior During Therapy: WFL for tasks assessed/performed Overall Cognitive Status: Within Functional Limits for tasks assessed                      Exercises Other Exercises Other Exercises: Pt ambulated 100 ft without AD, 250 ft with SPC with good speed. Fair stability without AD, good stability with SPC. Cues for sequence with SPC and increased BOS for improved balance. Other Exercises: Up/ down 3 steps with one rail and SPC with min guard with no LOB. Cues for sequence with SPC.    General Comments General comments (skin integrity, edema, etc.): pt back to baseline level of cognition, pleasant, on room air      Pertinent Vitals/Pain Pain Assessment: No/denies pain    Home Living                      Prior Function            PT Goals (current goals can now be found  in the care plan section) Acute Rehab PT Goals PT Goal Formulation: With patient Time For Goal Achievement: 01/28/16 Potential to Achieve Goals: Good Progress towards PT goals: Progressing toward goals    Frequency  Min 2X/week    PT Plan Discharge plan needs to be updated    Co-evaluation             End of Session Equipment Utilized During Treatment: Gait belt Activity Tolerance: Patient tolerated treatment well Patient left: in chair;with call bell/phone within reach     Time: [id number] PT Time Calculation (min) (ACUTE  ONLY): 23 min  Charges:  $Gait Training: 8-22 mins $Therapeutic Activity: 8-22 mins                    G Codes:      Neoma Laming, PT, DPT  01/17/2016, 1:26 PM [phone]

## 2016-01-17 NOTE — Progress Notes (Signed)
Brief Education Note:   Dietitian consult received for diet education; pt out of room for dialysis on visit, plan for discharge after dialysis. Written material on dialysis, diabetic diet left on patient bedside table. Discussed with Maddie RN. Also discussed with RN that dialysis center has an RD on staff and suggested that pt follow-up with dialysis RD at discharge. Maddie to discuss with pt.  Kerman Passey [place], [place], LDN [phone] Pager  [phone] Weekend/On-Call Pager

## 2016-01-18 ENCOUNTER — Encounter: Payer: Self-pay | Admitting: Emergency Medicine

## 2016-01-18 ENCOUNTER — Emergency Department
Admission: EM | Admit: 2016-01-18 | Discharge: 2016-01-18 | Disposition: A | Discharge status: Home / Self Care | Payer: Medicare Other | Attending: Emergency Medicine | Admitting: Emergency Medicine

## 2016-01-18 DIAGNOSIS — I132 Hypertensive heart and chronic kidney disease with heart failure and with stage 5 chronic kidney disease, or end stage renal disease: Secondary | ICD-10-CM | POA: Diagnosis not present

## 2016-01-18 DIAGNOSIS — J449 Chronic obstructive pulmonary disease, unspecified: Secondary | ICD-10-CM | POA: Diagnosis not present

## 2016-01-18 DIAGNOSIS — I5033 Acute on chronic diastolic (congestive) heart failure: Secondary | ICD-10-CM | POA: Insufficient documentation

## 2016-01-18 DIAGNOSIS — Z452 Encounter for adjustment and management of vascular access device: Secondary | ICD-10-CM

## 2016-01-18 DIAGNOSIS — E1122 Type 2 diabetes mellitus with diabetic chronic kidney disease: Secondary | ICD-10-CM | POA: Insufficient documentation

## 2016-01-18 DIAGNOSIS — J45909 Unspecified asthma, uncomplicated: Secondary | ICD-10-CM | POA: Diagnosis not present

## 2016-01-18 DIAGNOSIS — Z992 Dependence on renal dialysis: Secondary | ICD-10-CM | POA: Insufficient documentation

## 2016-01-18 DIAGNOSIS — N186 End stage renal disease: Secondary | ICD-10-CM | POA: Diagnosis not present

## 2016-01-18 DIAGNOSIS — Z4682 Encounter for fitting and adjustment of non-vascular catheter: Secondary | ICD-10-CM | POA: Insufficient documentation

## 2016-01-18 DIAGNOSIS — Z7982 Long term (current) use of aspirin: Secondary | ICD-10-CM | POA: Insufficient documentation

## 2016-01-18 DIAGNOSIS — E785 Hyperlipidemia, unspecified: Secondary | ICD-10-CM | POA: Insufficient documentation

## 2016-01-18 DIAGNOSIS — Z87891 Personal history of nicotine dependence: Secondary | ICD-10-CM | POA: Insufficient documentation

## 2016-01-18 DIAGNOSIS — Z79899 Other long term (current) drug therapy: Secondary | ICD-10-CM | POA: Diagnosis not present

## 2016-01-18 NOTE — ED Notes (Signed)
Patient originally states is here for PICC line removal. States was discharged yesterday but that she did not see it. Obsevation of site appears to be 22 g peripheral IV with no redness L shoulder.

## 2016-01-18 NOTE — ED Provider Notes (Signed)
[facility] Emergency Department Provider Note  ____________________________________________  Time seen: Approximately 11:47 AM  I have reviewed the triage vital signs and the nursing notes.   HISTORY  Chief Complaint Foreign Body in Skin    HPI Jasmine Holloway is a 55 y.o. female presents for evaluation of IV catheter in the left shoulder. Patient reports being discharged home from the hospital yesterday had PICC line removed but left prior to the IV catheter to be removed. Denies any complaints at this time here to have the catheter removed.   Past Medical History  Diagnosis Date  . ESRD (end stage renal disease) on dialysis ([place])   . Hypertension   . Polycystic kidney disease   . Asthma   . COPD (chronic obstructive pulmonary disease) ([place])   . Diabetes mellitus without complication ([place])   . Neuropathy ([place])   . Renal insufficiency     Patient Active Problem List   Diagnosis Date Noted  . Abdominal pain   . Acute pulmonary edema ([facility])   . Acute respiratory failure with hypoxia ([place]) 01/10/2016  . Elevated troponin 12/22/2015  . Acute on chronic diastolic CHF (congestive heart failure) ([place]) 12/22/2015  . ESRD on dialysis ([place]) 12/22/2015  . Benign essential HTN 12/22/2015  . Hyperlipidemia 12/22/2015  . Chest pain 02/24/2015    Past Surgical History  Procedure Laterality Date  . Arteriovenous graft placement Right     x3 (R forearm currently used for access)  . Vein harvest    . Colon surgery      Current Outpatient Rx  Name  Route  Sig  Dispense  Refill  . albuterol (PROVENTIL HFA;VENTOLIN HFA) 108 (90 Base) MCG/ACT inhaler   Inhalation   Inhale 2 puffs into the lungs every 6 (six) hours as needed for wheezing or shortness of breath.         . ALPRAZolam (XANAX) 0.5 MG tablet   Oral   Take 1 tablet (0.5 mg total) by mouth 3 (three) times daily as needed for anxiety.   20 tablet   0   . amLODipine (NORVASC) 10 MG tablet    Oral   Take 1 tablet (10 mg total) by mouth at bedtime.   30 tablet   2   . amoxicillin-clavulanate (AUGMENTIN) 500-125 MG tablet   Oral   Take 1 tablet (500 mg total) by mouth daily. X 3 more days   3 tablet   0   . aspirin 81 MG chewable tablet   Oral   Chew 1 tablet (81 mg total) by mouth daily.   30 tablet   2   . b complex-vitamin c-folic acid (NEPHRO-VITE) 0.8 MG TABS tablet   Oral   Take 1 tablet by mouth at bedtime.         . calcium acetate (PHOSLO) 667 MG capsule   Oral   Take 667 mg by mouth 3 (three) times daily with meals.          . cinacalcet (SENSIPAR) 30 MG tablet   Oral   Take 30 mg by mouth daily.         . cloNIDine (CATAPRES) 0.1 MG tablet   Oral   Take 1 tablet (0.1 mg total) by mouth 2 (two) times daily.   60 tablet   2   . furosemide (LASIX) 80 MG tablet   Oral   Take 80 mg by mouth daily.          Marland Kitchen gabapentin (NEURONTIN)  300 MG capsule   Oral   Take 300 mg by mouth 2 (two) times daily.          . irbesartan (AVAPRO) 150 MG tablet   Oral   Take 1 tablet (150 mg total) by mouth at bedtime.   30 tablet   2   . metoprolol succinate (TOPROL-XL) 50 MG 24 hr tablet   Oral   Take 50 mg by mouth daily. Take with or immediately following a meal.         . omeprazole (PRILOSEC) 20 MG capsule   Oral   Take 40 mg by mouth daily.          Marland Kitchen oxyCODONE-acetaminophen (PERCOCET) 7.5-325 MG tablet   Oral   Take 1 tablet by mouth every 8 (eight) hours as needed for moderate pain.   20 tablet   0   . phenol (CHLORASEPTIC) 1.4 % LIQD   Mouth/Throat   Use as directed 1 spray in the mouth or throat as needed for throat irritation / pain.   20 mL   0   . rosuvastatin (CRESTOR) 5 MG tablet   Oral   Take 5 mg by mouth daily.           Allergies Morphine and related; Vicodin; Buprenorphine hcl; and Codeine  Family History  Problem Relation Age of Onset  . Hypertension Brother     Social History Social History   Substance Use Topics  . Smoking status: Former Research scientist (life sciences)  . Smokeless tobacco: None  . Alcohol Use: No    Review of Systems Constitutional: No fever/chills Eyes: No visual changes. ENT: No sore throat. Cardiovascular: Denies chest pain. Respiratory: Denies shortness of breath. Gastrointestinal: No abdominal pain.  No nausea, no vomiting.  No diarrhea.  No constipation. Genitourinary: Negative for dysuria. Musculoskeletal: Negative for back pain. Skin: Positive for IV catheter intact left shoulder. Neurological: Negative for headaches, focal weakness or numbness.  10-point ROS otherwise negative.  ____________________________________________   PHYSICAL EXAM:  VITAL SIGNS: ED Triage Vitals  Enc Vitals Group     BP 01/18/16 1133 165/94 mmHg     Pulse Rate 01/18/16 1133 79     Resp 01/18/16 1133 18     Temp 01/18/16 1133 98.3 F (36.8 C)     Temp Source 01/18/16 1133 Oral     SpO2 01/18/16 1133 96 %     Weight 01/18/16 1133 184 lb (83.462 kg)     Height 01/18/16 1133 5\' 3"  (1.6 m)     Head Cir --      Peak Flow --      Pain Score --      Pain Loc --      Pain Edu? --      Excl. in [place]? --     Constitutional: Alert and oriented. Well appearing and in no acute distress. Neurologic:  Normal speech and language. No gross focal neurologic deficits are appreciated. No gait instability. Skin:  IV catheter noted in the left upper shoulder. Psychiatric: Mood and affect are normal. Speech and behavior are normal.  ____________________________________________   LABS (all labs ordered are listed, but only abnormal results are displayed)  Labs Reviewed - No data to display ____________________________________________  EKG   ____________________________________________  RADIOLOGY   ____________________________________________   PROCEDURES  Procedure(s) performed: None  Critical Care performed: No  ____________________________________________   INITIAL  IMPRESSION / ASSESSMENT AND PLAN / ED COURSE  Pertinent labs & imaging results that were available  during my care of the patient were reviewed by me and considered in my medical decision making (see chart for details).  IV catheter removed patient discharged home without complaint. ____________________________________________   FINAL CLINICAL IMPRESSION(S) / ED DIAGNOSES  Final diagnoses:  Encounter for removal of vascular catheter     This chart was dictated using voice recognition software/Dragon. Despite best efforts to proofread, errors can occur which can change the meaning. Any change was purely unintentional.   Arlyss Repress, PA-C 01/18/16 1253  Harvest Dark, MD 01/18/16 1452

## 2016-01-18 NOTE — Discharge Instructions (Signed)
Catheter-Associated Bloodstream Infections FAQs What is a catheter-associated bloodstream infection? A "central line" or "central catheter" is a tube that is placed into a patient's large vein, usually in the neck, chest, arm, or groin. The catheter is often used to draw blood, or give fluids or medications. It may be left in place for several weeks. A bloodstream infection can occur when bacteria or other germs travel down a "central line" and enter the blood. If you develop a catheter-associated bloodstream infection you may become ill with fevers and chills or the skin around the catheter may become sore and red. Can a catheter-related bloodstream infection be treated? A catheter-associated bloodstream infection is serious, but often can be successfully treated with antibiotics. The catheter might need to be removed if you develop an infection. What are some of the things that hospitals are doing to prevent catheter-associated bloodstream infections? To prevent catheter-associated bloodstream infections doctors and nurses will:  Choose a vein where the catheter can be safely inserted and where the risk for infection is small.  Clean their hands with soap and water or an alcohol-based hand rub before putting in the catheter.  Wear a mask, cap, sterile gown, and sterile gloves when putting in the catheter to keep it sterile. The patient will be covered with a sterile sheet.  Clean the patient's skin with an antiseptic cleanser before putting in the catheter.  Clean their hands, wear gloves, and clean the catheter opening with an antiseptic solution before using the catheter to draw blood or give medications. Healthcare providers also clean their hands and wear gloves when changing the bandage that covers the area where the catheter enters the skin.  Decide every day if the patient still needs to have the catheter. The catheter will be removed as soon as it is no longer needed.  Carefully handle  medications and fluids that are given through the catheter. What can I do to help prevent a catheter-associated bloodstream infection?  Ask your doctors and nurses to explain why you need the catheter and how long you will have it.  Ask your doctors and nurses if they will be using all of the prevention methods discussed above.  Make sure that all doctors and nurses caring for you clean their hands with soap and water or an alcohol-based hand rub before and after caring for you.  If you do not see your providers clean their hands, please ask them to do so.  If the bandage comes off or becomes wet or dirty, tell your nurse or doctor immediately.  Inform your nurse or doctor if the area around your catheter is sore or red.  Do not let family and friends who visit touch the catheter or the tubing.  Make sure family and friends clean their hands with soap and water or an alcohol-based hand rub before and after visiting you. What do I need to do when I go home from the hospital? Some patients are sent home from the hospital with a catheter in order to continue their treatment. If you go home with a catheter, your doctors and nurses will explain everything you need to know about taking care of your catheter.  Make sure you understand how to care for the catheter before leaving the hospital. For example, ask for instructions on showering or bathing with the catheter and how to change the catheter dressing.  Make sure you know who to contact if you have questions or problems after you get home.  Make sure  you wash your hands with soap and water or an alcohol-based hand rub before handling your catheter.  Watch for the signs and symptoms of catheter-associated bloodstream infection, such as soreness or redness at the catheter site or fever, and call your healthcare provider immediately if any occur. If you have additional questions, please ask your doctor or nurse. Developed and co-sponsored by  [employer] [phone]); Infectious Diseases Society of [place] (IDSA); [place]; Association for Professionals in Infection Control and Epidemiology (APIC); Centers for Disease Control and Prevention (CDC); and [employer].   This information is not intended to replace advice given to you by your health care provider. Make sure you discuss any questions you have with your health care provider.   Document Released: 11/21/2010 Document Revised: 12/11/2014 Document Reviewed: 10/10/2014 Elsevier Interactive Patient Education [employer].

## 2016-01-20 LAB — CULTURE, BLOOD (ROUTINE X 2)
Culture: NO GROWTH
Culture: NO GROWTH

## 2016-01-26 ENCOUNTER — Emergency Department
Admission: EM | Admit: 2016-01-26 | Discharge: 2016-01-26 | Disposition: A | Discharge status: Home / Self Care | Payer: Medicare Other | Attending: Emergency Medicine | Admitting: Emergency Medicine

## 2016-01-26 ENCOUNTER — Emergency Department: Payer: Medicare Other

## 2016-01-26 ENCOUNTER — Encounter: Payer: Self-pay | Admitting: Emergency Medicine

## 2016-01-26 ENCOUNTER — Other Ambulatory Visit: Payer: Self-pay

## 2016-01-26 DIAGNOSIS — Y929 Unspecified place or not applicable: Secondary | ICD-10-CM | POA: Diagnosis not present

## 2016-01-26 DIAGNOSIS — Y999 Unspecified external cause status: Secondary | ICD-10-CM | POA: Diagnosis not present

## 2016-01-26 DIAGNOSIS — Y939 Activity, unspecified: Secondary | ICD-10-CM | POA: Insufficient documentation

## 2016-01-26 DIAGNOSIS — I132 Hypertensive heart and chronic kidney disease with heart failure and with stage 5 chronic kidney disease, or end stage renal disease: Secondary | ICD-10-CM | POA: Insufficient documentation

## 2016-01-26 DIAGNOSIS — E785 Hyperlipidemia, unspecified: Secondary | ICD-10-CM | POA: Insufficient documentation

## 2016-01-26 DIAGNOSIS — Z951 Presence of aortocoronary bypass graft: Secondary | ICD-10-CM | POA: Diagnosis not present

## 2016-01-26 DIAGNOSIS — Z7982 Long term (current) use of aspirin: Secondary | ICD-10-CM | POA: Diagnosis not present

## 2016-01-26 DIAGNOSIS — S27301A Unspecified injury of lung, unilateral, initial encounter: Secondary | ICD-10-CM | POA: Diagnosis not present

## 2016-01-26 DIAGNOSIS — E1122 Type 2 diabetes mellitus with diabetic chronic kidney disease: Secondary | ICD-10-CM | POA: Insufficient documentation

## 2016-01-26 DIAGNOSIS — X58XXXA Exposure to other specified factors, initial encounter: Secondary | ICD-10-CM | POA: Insufficient documentation

## 2016-01-26 DIAGNOSIS — J384 Edema of larynx: Secondary | ICD-10-CM | POA: Insufficient documentation

## 2016-01-26 DIAGNOSIS — R0602 Shortness of breath: Secondary | ICD-10-CM | POA: Diagnosis present

## 2016-01-26 DIAGNOSIS — J449 Chronic obstructive pulmonary disease, unspecified: Secondary | ICD-10-CM | POA: Insufficient documentation

## 2016-01-26 DIAGNOSIS — I5033 Acute on chronic diastolic (congestive) heart failure: Secondary | ICD-10-CM | POA: Insufficient documentation

## 2016-01-26 DIAGNOSIS — J45909 Unspecified asthma, uncomplicated: Secondary | ICD-10-CM | POA: Diagnosis not present

## 2016-01-26 DIAGNOSIS — S279XXA Injury of unspecified intrathoracic organ, initial encounter: Secondary | ICD-10-CM

## 2016-01-26 DIAGNOSIS — Z992 Dependence on renal dialysis: Secondary | ICD-10-CM | POA: Insufficient documentation

## 2016-01-26 DIAGNOSIS — Z87891 Personal history of nicotine dependence: Secondary | ICD-10-CM | POA: Insufficient documentation

## 2016-01-26 DIAGNOSIS — N186 End stage renal disease: Secondary | ICD-10-CM | POA: Diagnosis not present

## 2016-01-26 DIAGNOSIS — Z79899 Other long term (current) drug therapy: Secondary | ICD-10-CM | POA: Diagnosis not present

## 2016-01-26 LAB — CBC WITH DIFFERENTIAL/PLATELET
BASOS ABS: 0.1 10*3/uL (ref 0–0.1)
Basophils Relative: 2 %
EOS ABS: 0.6 10*3/uL (ref 0–0.7)
EOS PCT: 8 %
HCT: 28.5 % — ABNORMAL LOW (ref 35.0–47.0)
Hemoglobin: 9.3 g/dL — ABNORMAL LOW (ref 12.0–16.0)
LYMPHS PCT: 24 %
Lymphs Abs: 1.7 10*3/uL (ref 1.0–3.6)
MCH: 30.1 pg (ref 26.0–34.0)
MCHC: 32.5 g/dL (ref 32.0–36.0)
MCV: 92.5 fL (ref 80.0–100.0)
Monocytes Absolute: 0.5 10*3/uL (ref 0.2–0.9)
Monocytes Relative: 8 %
Neutro Abs: 3.9 10*3/uL (ref 1.4–6.5)
Neutrophils Relative %: 58 %
PLATELETS: 316 10*3/uL (ref 150–440)
RBC: 3.09 MIL/uL — AB (ref 3.80–5.20)
RDW: 19.1 % — ABNORMAL HIGH (ref 11.5–14.5)
WBC: 6.8 10*3/uL (ref 3.6–11.0)

## 2016-01-26 LAB — BASIC METABOLIC PANEL
ANION GAP: 16 — AB (ref 5–15)
BUN: 58 mg/dL — ABNORMAL HIGH (ref 6–20)
CALCIUM: 8.8 mg/dL — AB (ref 8.9–10.3)
CO2: 18 mmol/L — AB (ref 22–32)
Chloride: 105 mmol/L (ref 101–111)
Creatinine, Ser: 12.14 mg/dL — ABNORMAL HIGH (ref 0.44–1.00)
GFR calc non Af Amer: 3 mL/min — ABNORMAL LOW (ref >60)
GFR, EST AFRICAN AMERICAN: 3 mL/min — AB (ref >60)
Glucose, Bld: 77 mg/dL (ref 65–99)
POTASSIUM: 5.4 mmol/L — AB (ref 3.5–5.1)
SODIUM: 139 mmol/L (ref 135–145)

## 2016-01-26 MED ORDER — DEXAMETHASONE 4 MG PO TABS
8.0000 mg | ORAL_TABLET | Freq: Once | ORAL | Status: DC
  Filled 2016-01-26: qty 2

## 2016-01-26 MED ORDER — DEXAMETHASONE SODIUM PHOSPHATE 10 MG/ML IJ SOLN
INTRAMUSCULAR | Status: AC
  Administered 2016-01-26: 8 mg
  Filled 2016-01-26: qty 1

## 2016-01-26 NOTE — ED Provider Notes (Signed)
[facility] Emergency Department Provider Note  Time seen: 4:04 PM  I have reviewed the triage vital signs and the nursing notes.   HISTORY  Chief Complaint Shortness of Breath    HPI Jasmine Holloway is a 55 y.o. female with a past medical history of end-stage renal disease on hemodialysis Monday, Wednesday, Friday, hypertension, asthma, COPD, who presents to the emergency department with throat discomfort and secretions. According to the patient she was discharged approximately one week ago after an admission to the hospital for difficulty breathing/pulmonary edema in which she was intubated. Patient states since the breathing tube is been removed she is continued to have discomfort in her throat as well as significant secretions, and feels like her airway is not open. Patient states she saw an ENT who said she had polyps and referred her to a specialist for polyp removal. States her symptoms have not improved, and feels like they could be worsening so she came to the emergency department for evaluation. Patient denies any chest pain, or any trouble breathing with her lungs, but feels like she cannot get a significant amount of air past her throat. States her throat is very sore as well. Denies fever, does feel congested, does occasionally cough but she relates this to the mucus in her throat.     Past Medical History  Diagnosis Date  . ESRD (end stage renal disease) on dialysis ([place])   . Hypertension   . Polycystic kidney disease   . Asthma   . COPD (chronic obstructive pulmonary disease) ([place])   . Diabetes mellitus without complication ([place])   . Neuropathy ([place])   . Renal insufficiency     Patient Active Problem List   Diagnosis Date Noted  . Abdominal pain   . Acute pulmonary edema (HCC)   . Acute respiratory failure with hypoxia ([place]) 01/10/2016  . Elevated troponin 12/22/2015  . Acute on chronic diastolic CHF (congestive heart failure) ([place]) 12/22/2015   . ESRD on dialysis ([place]) 12/22/2015  . Benign essential HTN 12/22/2015  . Hyperlipidemia 12/22/2015  . Chest pain 02/24/2015    Past Surgical History  Procedure Laterality Date  . Arteriovenous graft placement Right     x3 (R forearm currently used for access)  . Vein harvest    . Colon surgery      Current Outpatient Rx  Name  Route  Sig  Dispense  Refill  . albuterol (PROVENTIL HFA;VENTOLIN HFA) 108 (90 Base) MCG/ACT inhaler   Inhalation   Inhale 2 puffs into the lungs every 6 (six) hours as needed for wheezing or shortness of breath.         . ALPRAZolam (XANAX) 0.5 MG tablet   Oral   Take 1 tablet (0.5 mg total) by mouth 3 (three) times daily as needed for anxiety.   20 tablet   0   . amLODipine (NORVASC) 10 MG tablet   Oral   Take 1 tablet (10 mg total) by mouth at bedtime.   30 tablet   2   . amoxicillin-clavulanate (AUGMENTIN) 500-125 MG tablet   Oral   Take 1 tablet (500 mg total) by mouth daily. X 3 more days   3 tablet   0   . aspirin 81 MG chewable tablet   Oral   Chew 1 tablet (81 mg total) by mouth daily.   30 tablet   2   . b complex-vitamin c-folic acid (NEPHRO-VITE) 0.8 MG TABS tablet   Oral   Take  1 tablet by mouth at bedtime.         . calcium acetate (PHOSLO) 667 MG capsule   Oral   Take 667 mg by mouth 3 (three) times daily with meals.          . cinacalcet (SENSIPAR) 30 MG tablet   Oral   Take 30 mg by mouth daily.         . cloNIDine (CATAPRES) 0.1 MG tablet   Oral   Take 1 tablet (0.1 mg total) by mouth 2 (two) times daily.   60 tablet   2   . furosemide (LASIX) 80 MG tablet   Oral   Take 80 mg by mouth daily.          Marland Kitchen gabapentin (NEURONTIN) 300 MG capsule   Oral   Take 300 mg by mouth 2 (two) times daily.          . irbesartan (AVAPRO) 150 MG tablet   Oral   Take 1 tablet (150 mg total) by mouth at bedtime.   30 tablet   2   . metoprolol succinate (TOPROL-XL) 50 MG 24 hr tablet   Oral   Take 50 mg by  mouth daily. Take with or immediately following a meal.         . omeprazole (PRILOSEC) 20 MG capsule   Oral   Take 40 mg by mouth daily.          Marland Kitchen oxyCODONE-acetaminophen (PERCOCET) 7.5-325 MG tablet   Oral   Take 1 tablet by mouth every 8 (eight) hours as needed for moderate pain.   20 tablet   0   . phenol (CHLORASEPTIC) 1.4 % LIQD   Mouth/Throat   Use as directed 1 spray in the mouth or throat as needed for throat irritation / pain.   20 mL   0   . rosuvastatin (CRESTOR) 5 MG tablet   Oral   Take 5 mg by mouth daily.           Allergies Morphine and related; Vicodin; Buprenorphine hcl; and Codeine  Family History  Problem Relation Age of Onset  . Hypertension Brother     Social History Social History  Substance Use Topics  . Smoking status: Former Research scientist (life sciences)  . Smokeless tobacco: None  . Alcohol Use: No    Review of Systems Constitutional: Negative for fever. Cardiovascular: Negative for chest pain. Respiratory: Denies any type of lung issue, states her vehicle to breathing comes from her not being over to get enough air past her throat. Gastrointestinal: Negative for abdominal pain Musculoskeletal: Negative for back pain. Neurological: Negative for headache 10-point ROS otherwise negative.  ____________________________________________   PHYSICAL EXAM:  VITAL SIGNS: ED Triage Vitals  Enc Vitals Group     BP 01/26/16 1351 142/88 mmHg     Pulse Rate 01/26/16 1351 70     Resp 01/26/16 1351 26     Temp 01/26/16 1351 97.9 F (36.6 C)     Temp Source 01/26/16 1351 Oral     SpO2 01/26/16 1351 98 %     Weight 01/26/16 1351 184 lb (83.462 kg)     Height 01/26/16 1351 5\' 3"  (1.6 m)     Head Cir --      Peak Flow --      Pain Score 01/26/16 1351 9     Pain Loc --      Pain Edu? --      Excl. in [place]? --  Constitutional: Alert and oriented. Well appearing and in no distress. Eyes: Normal exam ENT   Head: Normocephalic and atraumatic.    Mouth/Throat: Mucous membranes are moist.No pharyngeal erythema uvular deviation or tonsillar hypertrophy noted. No stridor. Cardiovascular: Normal rate, regular rhythm. No murmur Respiratory: Normal respiratory effort without tachypnea nor retractions. Breath sounds are clear Gastrointestinal: Soft and nontender. No distention.  Musculoskeletal: Nontender with normal range of motion in all extremities. Right upper extremity fistula, good thrill. Neurologic:  Normal speech and language. No gross focal neurologic deficits  Skin:  Skin is warm, dry and intact.  Psychiatric: Mood and affect are normal.   ____________________________________________     RADIOLOGY  CT shows mild edema overlying the arytenoid cartilage  ____________________________________________    INITIAL IMPRESSION / ASSESSMENT AND PLAN / ED COURSE  Pertinent labs & imaging results that were available during my care of the patient were reviewed by me and considered in my medical decision making (see chart for details).  The patient presents to the emergency department for throat discomfort, throat secretions which she feels getting worse since her discharge one week ago after being intubated in the hospital. Overall the patient appears well, initially she is sleeping comfortably, upon awakening she does clear her throat. Often, but she is able to speak without difficulty, swallow secretions without difficulty. Given the patient's feeling of worsening symptoms. We'll obtain a CT scan without contrast of the neck of the patient continues to make urine.  CT shows mild edema, with mild narrowing of the airway, likely due to her recent intubation. We will dose Decadron in the emergency department. Patient states she has ENT follow-up scheduled. ____________________________________________   FINAL CLINICAL IMPRESSION(S) / ED DIAGNOSES  Throat discomfort Laryngeal edema  Harvest Dark, MD 01/26/16 [phone]

## 2016-01-26 NOTE — ED Notes (Signed)
Patient reports severe headache that began yesterday evening with weakness on her right side and lack of balance.  Patient reports pain to the right side of her face.  Patient states she was discharged from the ICU about 1 week ago.

## 2016-01-26 NOTE — ED Notes (Signed)
AAOx3.  Skin warm and dry.  NAD.  MAE Equally and strong.  Ambulates with easy and steady gait.  NAD

## 2016-01-26 NOTE — Discharge Instructions (Signed)
You have been seen in the emergency department for throat pain. Your CT scan is consistent with swelling likely due to her recent intubation. You have been dosed a long-acting steroid in the emergency department, no further steroids are needed. Please follow-up with ENT by calling the number provided to arrange a follow-up appointment as soon as possible. Return to the emergency department for difficulty breathing, swallowing, or any other symptom personally concerning to yourself.

## 2016-01-26 NOTE — ED Notes (Signed)
Pt reports shortness of breath x2 days, reports sore throat. Pt was intubated x1 week ago. Pt reports dizziness and weakness. Pt is a dialysis patient, had high potassium and went to dialysis M-th.

## 2016-02-04 ENCOUNTER — Encounter: Payer: Self-pay | Admitting: Family

## 2016-02-04 ENCOUNTER — Ambulatory Visit: Payer: Medicare Other | Attending: Family | Admitting: Family

## 2016-02-04 ENCOUNTER — Ambulatory Visit: Payer: Medicare Other | Admitting: Speech Pathology

## 2016-02-04 VITALS — BP 154/89 | HR 65 | Resp 18 | Ht 63.0 in | Wt 185.0 lb

## 2016-02-04 DIAGNOSIS — J449 Chronic obstructive pulmonary disease, unspecified: Secondary | ICD-10-CM | POA: Diagnosis not present

## 2016-02-04 DIAGNOSIS — E1122 Type 2 diabetes mellitus with diabetic chronic kidney disease: Secondary | ICD-10-CM | POA: Insufficient documentation

## 2016-02-04 DIAGNOSIS — G629 Polyneuropathy, unspecified: Secondary | ICD-10-CM | POA: Diagnosis not present

## 2016-02-04 DIAGNOSIS — R490 Dysphonia: Secondary | ICD-10-CM | POA: Diagnosis not present

## 2016-02-04 DIAGNOSIS — Q613 Polycystic kidney, unspecified: Secondary | ICD-10-CM | POA: Diagnosis not present

## 2016-02-04 DIAGNOSIS — N186 End stage renal disease: Secondary | ICD-10-CM | POA: Diagnosis not present

## 2016-02-04 DIAGNOSIS — Z992 Dependence on renal dialysis: Secondary | ICD-10-CM | POA: Diagnosis not present

## 2016-02-04 DIAGNOSIS — R0602 Shortness of breath: Secondary | ICD-10-CM | POA: Diagnosis not present

## 2016-02-04 DIAGNOSIS — Z7682 Awaiting organ transplant status: Secondary | ICD-10-CM | POA: Diagnosis not present

## 2016-02-04 DIAGNOSIS — I5032 Chronic diastolic (congestive) heart failure: Secondary | ICD-10-CM | POA: Diagnosis present

## 2016-02-04 DIAGNOSIS — Z79899 Other long term (current) drug therapy: Secondary | ICD-10-CM | POA: Diagnosis not present

## 2016-02-04 DIAGNOSIS — Z87891 Personal history of nicotine dependence: Secondary | ICD-10-CM | POA: Insufficient documentation

## 2016-02-04 DIAGNOSIS — Z885 Allergy status to narcotic agent status: Secondary | ICD-10-CM | POA: Insufficient documentation

## 2016-02-04 DIAGNOSIS — I132 Hypertensive heart and chronic kidney disease with heart failure and with stage 5 chronic kidney disease, or end stage renal disease: Secondary | ICD-10-CM | POA: Diagnosis not present

## 2016-02-04 DIAGNOSIS — Z7982 Long term (current) use of aspirin: Secondary | ICD-10-CM | POA: Insufficient documentation

## 2016-02-04 DIAGNOSIS — Z888 Allergy status to other drugs, medicaments and biological substances status: Secondary | ICD-10-CM | POA: Insufficient documentation

## 2016-02-04 DIAGNOSIS — I10 Essential (primary) hypertension: Secondary | ICD-10-CM

## 2016-02-04 NOTE — Progress Notes (Signed)
Subjective:    Patient ID: Jasmine Holloway, female    DOB: 07-09-1961, 55 y.o.   MRN: [id number]  Congestive Heart Failure Presents for initial visit. The disease course has been improving. Associated symptoms include fatigue ("mild, getting better") and shortness of breath ("at times"). Pertinent negatives include no abdominal pain, chest pain, edema, orthopnea or palpitations. The symptoms have been improving. Past treatments include salt and fluid restriction, angiotensin receptor blockers and beta blockers. The treatment provided significant relief. Compliance with prior treatments has been good. Her past medical history is significant for chronic lung disease, DM and HTN. There is no history of CVA.  Hypertension This is a chronic problem. The current episode started more than 1 year ago. The problem is unchanged. Associated symptoms include shortness of breath ("at times"). Pertinent negatives include no chest pain, headaches, neck pain, palpitations or peripheral edema. There are no associated agents to hypertension. Risk factors for coronary artery disease include smoking/tobacco exposure. Past treatments include alpha 1 blockers, beta blockers, calcium channel blockers, lifestyle changes and diuretics. The current treatment provides moderate improvement. There are no compliance problems.  Hypertensive end-organ damage includes kidney disease and heart failure.   Past Medical History  Diagnosis Date  . ESRD (end stage renal disease) on dialysis ([place])   . Hypertension   . Polycystic kidney disease   . Asthma   . COPD (chronic obstructive pulmonary disease) ([place])   . Diabetes mellitus without complication ([place])   . Neuropathy ([place])   . Renal insufficiency     Past Surgical History  Procedure Laterality Date  . Arteriovenous graft placement Right     x3 (R forearm currently used for access)  . Vein harvest    . Colon surgery      Family History  Problem Relation Age of Onset  .  Hypertension Brother     Social History  Substance Use Topics  . Smoking status: Former Smoker -- 0.25 packs/day    Quit date: 08/11/2015  . Smokeless tobacco: Never Used  . Alcohol Use: No    Allergies  Allergen Reactions  . Morphine And Related Nausea And Vomiting  . Vicodin [Hydrocodone-Acetaminophen] Nausea And Vomiting  . Buprenorphine Hcl Nausea And Vomiting  . Codeine Nausea And Vomiting    Prior to Admission medications   Medication Sig Start Date End Date Taking? Authorizing Provider  albuterol (PROVENTIL HFA;VENTOLIN HFA) 108 (90 Base) MCG/ACT inhaler Inhale 2 puffs into the lungs every 6 (six) hours as needed for wheezing or shortness of breath.   Yes Historical Provider, MD  amLODipine (NORVASC) 10 MG tablet Take 1 tablet (10 mg total) by mouth at bedtime. 01/17/16  Yes Gladstone Lighter, MD  aspirin 81 MG chewable tablet Chew 1 tablet (81 mg total) by mouth daily. 01/17/16  Yes Gladstone Lighter, MD  b complex-vitamin c-folic acid (NEPHRO-VITE) 0.8 MG TABS tablet Take 1 tablet by mouth at bedtime.   Yes Historical Provider, MD  calcium acetate (PHOSLO) 667 MG capsule Take 667 mg by mouth 3 (three) times daily with meals.    Yes Historical Provider, MD  cinacalcet (SENSIPAR) 30 MG tablet Take 30 mg by mouth daily.   Yes Historical Provider, MD  cloNIDine (CATAPRES) 0.1 MG tablet Take 1 tablet (0.1 mg total) by mouth 2 (two) times daily. 01/17/16  Yes Gladstone Lighter, MD  furosemide (LASIX) 80 MG tablet Take 80 mg by mouth daily.    Yes Historical Provider, MD  gabapentin (NEURONTIN) 300 MG capsule Take 300  mg by mouth 2 (two) times daily.    Yes Historical Provider, MD  irbesartan (AVAPRO) 150 MG tablet Take 1 tablet (150 mg total) by mouth at bedtime. 01/17/16  Yes Gladstone Lighter, MD  metoprolol succinate (TOPROL-XL) 50 MG 24 hr tablet Take 50 mg by mouth daily. Take with or immediately following a meal.   Yes Historical Provider, MD  omeprazole (PRILOSEC) 20 MG capsule Take  40 mg by mouth daily.    Yes Historical Provider, MD  oxyCODONE-acetaminophen (PERCOCET) 7.5-325 MG tablet Take 1 tablet by mouth every 8 (eight) hours as needed for moderate pain. 01/17/16  Yes Gladstone Lighter, MD  phenol (CHLORASEPTIC) 1.4 % LIQD Use as directed 1 spray in the mouth or throat as needed for throat irritation / pain. 01/17/16  Yes Gladstone Lighter, MD  rosuvastatin (CRESTOR) 5 MG tablet Take 5 mg by mouth daily.   Yes Historical Provider, MD      Review of Systems  Constitutional: Positive for fatigue ("mild, getting better"). Negative for appetite change.  HENT: Positive for sore throat, trouble swallowing ("at times, getting better") and voice change. Negative for ear pain.   Eyes: Negative.   Respiratory: Positive for shortness of breath ("at times"). Negative for choking and chest tightness.   Cardiovascular: Negative for chest pain, palpitations and leg swelling.  Gastrointestinal: Negative for abdominal pain and abdominal distention.  Endocrine: Negative.   Genitourinary: Negative.   Musculoskeletal: Negative for back pain and neck pain.  Skin: Negative.   Allergic/Immunologic: Negative.   Neurological: Positive for light-headedness ("sometimes in the morning"). Negative for dizziness, weakness and headaches.  Hematological: Negative for adenopathy. Does not bruise/bleed easily.  Psychiatric/Behavioral: Positive for dysphoric mood ("sad at times"). Negative for suicidal ideas and sleep disturbance (sleeping on 2 pillows). The patient is not nervous/anxious.        Objective:   Physical Exam  Constitutional: She is oriented to person, place, and time. She appears well-developed and well-nourished.  HENT:  Head: Normocephalic and atraumatic.  Eyes: Conjunctivae are normal. Pupils are equal, round, and reactive to light.  Neck: Normal range of motion. Neck supple.  Cardiovascular: Normal rate and regular rhythm.   Pulmonary/Chest: Effort normal. She has no  wheezes. She has no rales.  Abdominal: Soft. She exhibits no distension. There is no tenderness.  Musculoskeletal: She exhibits no edema or tenderness.  Neurological: She is alert and oriented to person, place, and time.  Skin: Skin is warm and dry.  Psychiatric: She has a normal mood and affect. Her behavior is normal. Thought content normal.  Nursing note and vitals reviewed.   BP 154/89 mmHg  Pulse 65  Resp 18  Ht 5\' 3"  (1.6 m)  Wt 185 lb (83.915 kg)  BMI 32.78 kg/m2  SpO2 100%       Assessment & Plan:  1: Chronic heart failure with preserved ejection fraction- Patient presents with a mild amount of fatigue and shortness of breath upon exertion (Class II) which is quickly relieved with rest. Denied being very short of breath upon walking into the office today. She is not weighing herself daily as she doesn't have any scales so a set was given to her. Instructed her to weigh herself every morning after using the bathroom and to call for an overnight weight gain of >2 pounds or a weekly weight gain of >5 pounds. She is not adding any salt to her food and is reading food labels for sodium content. She admitted that prior to her  hospitalization she had gotten off track and was eating foods that she knew weren't good for her. Discussed the importance of closely following a 2000mg  sodium diet and written information was given to her about this. She is receiving physical therapy at home and feels like she is doing well with that. Does not have a cardiologist and she says that she will speak with her PCP about getting a referral since she has medicaid. 2: HTN- Blood pressure looks good today. Continue medications at this time. 3: ESRD on dialysis- She receives dialysis on M, W, F and is currently on the kidney transplant list.  4: Hoarseness- She says that she's seen ENT about this and feels like the hoarseness is improving. She understands that this can take awhile to resolve after being  intubated.  Patient did not bring her medications nor a list. Each medication was verbally reviewed with the patient and she was encouraged to bring the bottles to every visit to confirm accuracy of list.  Return in 1 month or sooner for any questions/problems before then.

## 2016-02-04 NOTE — Patient Instructions (Addendum)
Begin weighing daily and call for an overnight weight gain of > 2 pounds or a weekly weight gain of >5 pounds.   Ask Dr. Ladoris Gene to refer you to a cardiologist to be established.

## 2016-02-06 ENCOUNTER — Ambulatory Visit: Payer: Medicare Other | Admitting: Speech Pathology

## 2016-02-13 ENCOUNTER — Ambulatory Visit: Payer: Medicare Other | Admitting: Speech Pathology

## 2016-02-18 ENCOUNTER — Ambulatory Visit: Payer: Medicare Other | Admitting: Speech Pathology

## 2016-02-20 ENCOUNTER — Ambulatory Visit: Payer: Medicare Other | Admitting: Speech Pathology

## 2016-02-25 ENCOUNTER — Ambulatory Visit: Payer: Medicare Other | Admitting: Speech Pathology

## 2016-02-27 ENCOUNTER — Ambulatory Visit: Payer: Medicare Other | Admitting: Speech Pathology

## 2016-03-03 ENCOUNTER — Ambulatory Visit: Payer: Medicare Other | Admitting: Speech Pathology

## 2016-03-05 ENCOUNTER — Encounter: Payer: Self-pay | Admitting: Family

## 2016-03-05 ENCOUNTER — Ambulatory Visit: Payer: Medicare Other | Attending: Family | Admitting: Family

## 2016-03-05 ENCOUNTER — Ambulatory Visit: Payer: Medicare Other | Admitting: Speech Pathology

## 2016-03-05 VITALS — BP 160/68 | HR 73 | Resp 18 | Ht 63.0 in | Wt 178.0 lb

## 2016-03-05 DIAGNOSIS — I5032 Chronic diastolic (congestive) heart failure: Secondary | ICD-10-CM | POA: Diagnosis present

## 2016-03-05 DIAGNOSIS — Z87891 Personal history of nicotine dependence: Secondary | ICD-10-CM | POA: Insufficient documentation

## 2016-03-05 DIAGNOSIS — I132 Hypertensive heart and chronic kidney disease with heart failure and with stage 5 chronic kidney disease, or end stage renal disease: Secondary | ICD-10-CM | POA: Diagnosis not present

## 2016-03-05 DIAGNOSIS — J449 Chronic obstructive pulmonary disease, unspecified: Secondary | ICD-10-CM | POA: Insufficient documentation

## 2016-03-05 DIAGNOSIS — N186 End stage renal disease: Secondary | ICD-10-CM | POA: Insufficient documentation

## 2016-03-05 DIAGNOSIS — R0602 Shortness of breath: Secondary | ICD-10-CM | POA: Insufficient documentation

## 2016-03-05 DIAGNOSIS — Z885 Allergy status to narcotic agent status: Secondary | ICD-10-CM | POA: Diagnosis not present

## 2016-03-05 DIAGNOSIS — Q613 Polycystic kidney, unspecified: Secondary | ICD-10-CM | POA: Diagnosis not present

## 2016-03-05 DIAGNOSIS — Z992 Dependence on renal dialysis: Secondary | ICD-10-CM | POA: Diagnosis not present

## 2016-03-05 DIAGNOSIS — G629 Polyneuropathy, unspecified: Secondary | ICD-10-CM | POA: Diagnosis not present

## 2016-03-05 DIAGNOSIS — H538 Other visual disturbances: Secondary | ICD-10-CM | POA: Diagnosis not present

## 2016-03-05 DIAGNOSIS — Z8249 Family history of ischemic heart disease and other diseases of the circulatory system: Secondary | ICD-10-CM | POA: Diagnosis not present

## 2016-03-05 DIAGNOSIS — R490 Dysphonia: Secondary | ICD-10-CM | POA: Diagnosis not present

## 2016-03-05 DIAGNOSIS — E1122 Type 2 diabetes mellitus with diabetic chronic kidney disease: Secondary | ICD-10-CM | POA: Insufficient documentation

## 2016-03-05 DIAGNOSIS — R200 Anesthesia of skin: Secondary | ICD-10-CM | POA: Insufficient documentation

## 2016-03-05 DIAGNOSIS — I10 Essential (primary) hypertension: Secondary | ICD-10-CM

## 2016-03-05 DIAGNOSIS — R609 Edema, unspecified: Secondary | ICD-10-CM | POA: Insufficient documentation

## 2016-03-05 DIAGNOSIS — Z79899 Other long term (current) drug therapy: Secondary | ICD-10-CM | POA: Diagnosis not present

## 2016-03-05 DIAGNOSIS — Z7982 Long term (current) use of aspirin: Secondary | ICD-10-CM | POA: Insufficient documentation

## 2016-03-05 MED ORDER — FUROSEMIDE 80 MG PO TABS: 80.0000 mg | ORAL_TABLET | Freq: Every day | ORAL | 3 refills | Status: DC

## 2016-03-05 MED ORDER — ALBUTEROL SULFATE HFA 108 (90 BASE) MCG/ACT IN AERS: 2.0000 | INHALATION_SPRAY | Freq: Four times a day (QID) | RESPIRATORY_TRACT | 3 refills | Status: DC | PRN

## 2016-03-05 NOTE — Progress Notes (Signed)
Subjective:    Patient ID: Jasmine Holloway, female    DOB: 1960-11-29, 55 y.o.   MRN: [id number]  Congestive Heart Failure  Presents for follow-up visit. Associated symptoms include edema and shortness of breath (minimal). Pertinent negatives include no abdominal pain, chest pain, fatigue, orthopnea or palpitations. The symptoms have been stable.  Hypertension  This is a chronic problem. The current episode started more than 1 year ago. The problem has been waxing and waning since onset. Associated symptoms include peripheral edema and shortness of breath (minimal). Pertinent negatives include no chest pain, neck pain or palpitations. There are no associated agents to hypertension. There are no known risk factors for coronary artery disease. Past treatments include calcium channel blockers, alpha 1 blockers, diuretics, lifestyle changes, angiotensin blockers and beta blockers. The current treatment provides mild improvement. There are no compliance problems.  Hypertensive end-organ damage includes kidney disease and heart failure.   Past Medical History:  Diagnosis Date  . Asthma   . COPD (chronic obstructive pulmonary disease) ([place])   . Diabetes mellitus without complication ([place])   . ESRD (end stage renal disease) on dialysis ([place])   . Hypertension   . Neuropathy ([place])   . Polycystic kidney disease   . Renal insufficiency     Past Surgical History:  Procedure Laterality Date  . ARTERIOVENOUS GRAFT PLACEMENT Right    x3 (R forearm currently used for access)  . COLON SURGERY    . VEIN HARVEST      Family History  Problem Relation Age of Onset  . Hypertension Brother     Social History  Substance Use Topics  . Smoking status: Former Smoker    Packs/day: 0.25    Quit date: 08/11/2015  . Smokeless tobacco: Never Used  . Alcohol use No    Allergies  Allergen Reactions  . Morphine And Related Nausea And Vomiting  . Vicodin [Hydrocodone-Acetaminophen] Nausea And Vomiting  .  Buprenorphine Hcl Nausea And Vomiting  . Codeine Nausea And Vomiting    Prior to Admission medications   Medication Sig Start Date End Date Taking? Authorizing Provider  albuterol (PROVENTIL HFA;VENTOLIN HFA) 108 (90 Base) MCG/ACT inhaler Inhale 2 puffs into the lungs every 6 (six) hours as needed for wheezing or shortness of breath. 03/05/16 06/03/16 Yes Alisa Graff, FNP  amLODipine (NORVASC) 10 MG tablet Take 1 tablet (10 mg total) by mouth at bedtime. 01/17/16  Yes Gladstone Lighter, MD  aspirin 81 MG chewable tablet Chew 1 tablet (81 mg total) by mouth daily. 01/17/16  Yes Gladstone Lighter, MD  b complex-vitamin c-folic acid (NEPHRO-VITE) 0.8 MG TABS tablet Take 1 tablet by mouth at bedtime.   Yes Historical Provider, MD  calcium acetate (PHOSLO) 667 MG capsule Take 667 mg by mouth 3 (three) times daily with meals.    Yes Historical Provider, MD  cinacalcet (SENSIPAR) 30 MG tablet Take 30 mg by mouth daily.   Yes Historical Provider, MD  cloNIDine (CATAPRES) 0.1 MG tablet Take 1 tablet (0.1 mg total) by mouth 2 (two) times daily. 01/17/16  Yes Gladstone Lighter, MD  furosemide (LASIX) 80 MG tablet Take 1 tablet (80 mg total) by mouth daily. 03/05/16 06/03/16 Yes Alisa Graff, FNP  gabapentin (NEURONTIN) 300 MG capsule Take 300 mg by mouth 2 (two) times daily.    Yes Historical Provider, MD  irbesartan (AVAPRO) 150 MG tablet Take 1 tablet (150 mg total) by mouth at bedtime. 01/17/16  Yes Gladstone Lighter, MD  metoprolol succinate (TOPROL-XL)  50 MG 24 hr tablet Take 50 mg by mouth daily. Take with or immediately following a meal.   Yes Historical Provider, MD  omeprazole (PRILOSEC) 20 MG capsule Take 40 mg by mouth daily.    Yes Historical Provider, MD  oxyCODONE-acetaminophen (PERCOCET) 7.5-325 MG tablet Take 1 tablet by mouth every 8 (eight) hours as needed for moderate pain. Patient taking differently: Take 1 tablet by mouth daily as needed for moderate pain.  01/17/16  Yes Gladstone Lighter, MD   rosuvastatin (CRESTOR) 5 MG tablet Take 5 mg by mouth daily.   Yes Historical Provider, MD      Review of Systems  Constitutional: Negative for appetite change and fatigue.  HENT: Positive for voice change (hoarseness getting better). Negative for congestion, sinus pressure and sore throat.   Eyes: Positive for visual disturbance (blurry vision). Negative for pain.  Respiratory: Positive for shortness of breath (minimal). Negative for cough, chest tightness and wheezing.   Cardiovascular: Positive for leg swelling. Negative for chest pain and palpitations.  Gastrointestinal: Negative for abdominal distention and abdominal pain.  Endocrine: Negative.   Genitourinary: Negative.   Musculoskeletal: Negative for back pain and neck pain.  Skin: Negative.   Allergic/Immunologic: Negative.   Neurological: Positive for numbness (tingling down left arm at times at work). Negative for dizziness and light-headedness.  Hematological: Negative for adenopathy. Does not bruise/bleed easily.  Psychiatric/Behavioral: Negative for dysphoric mood and sleep disturbance (sleeping on 2 pillows). The patient is not nervous/anxious.        Objective:   Physical Exam  Constitutional: She is oriented to person, place, and time. She appears well-developed and well-nourished.  HENT:  Head: Normocephalic and atraumatic.  Eyes: Conjunctivae are normal. Pupils are equal, round, and reactive to light.  Neck: Normal range of motion. Neck supple.  Cardiovascular: Normal rate and regular rhythm.   Pulmonary/Chest: Effort normal. She has no wheezes. She has no rales.  Abdominal: Soft. She exhibits no distension. There is no tenderness.  Musculoskeletal: She exhibits edema (trace amount pitting edema in bilateral lower legs). She exhibits no tenderness.  Neurological: She is alert and oriented to person, place, and time.  Skin: Skin is warm and dry.  Psychiatric: She has a normal mood and affect. Her behavior is  normal. Thought content normal.  Nursing note and vitals reviewed.   BP (!) 160/68   Pulse 73   Resp 18   Ht 5\' 3"  (1.6 m)   Wt 178 lb (80.7 kg)   SpO2 99%   BMI 31.53 kg/m        Assessment & Plan:  1: Chronic heart failure with preserved ejection fraction- Patient presents with shortness of breath with moderate exertion (Class II). She says that when she does get short of breath, she will sit down to rest for a few minutes and her breathing quickly improves. She has a mild amount of edema in her lower legs. She continues to weigh herself and has a reported weight loss since she's been back at work. By our scale, she's lost 6.6 pounds since she was last here on 02/04/16. Reminded to call for an overnight weight gain of >2 pounds or a weekly weight gain of >5 pounds. She is not adding any salt to her food and is closely reading food labels. She is waiting to get in with her PCP so that he can make a referral to cardiology. Patient has medicaid so needs PCP to make the referral.  2: HTN- Blood pressure  elevated and patient says that it's not usually that high. Gets checked each time at dialysis. She is talking quite a bit here so she was instructed to keep an eye on her blood pressure and if it gets high like in the office today to call back for possible medication adjustment.  3.ESRD on dialysis- Patient goes to dialysis on M, W, F.  4: Hoarseness- Patient says that her hoarseness is improving. Tends to get worse as the day progresses or if she is talking a lot.  Patient did not bring her medications nor a list. Each medication was verbally reviewed with the patient and she was encouraged to bring the bottles to every visit to confirm accuracy of list.  Return here in 3 months or sooner for any questions/problems before then.

## 2016-03-05 NOTE — Patient Instructions (Signed)
Continue weighing daily and call for an overnight weight gain of > 2 pounds or a weekly weight gain of >5 pounds. 

## 2016-03-17 ENCOUNTER — Encounter: Payer: Self-pay | Admitting: Emergency Medicine

## 2016-03-17 ENCOUNTER — Encounter
Admission: EM | Disposition: A | Discharge status: Home / Self Care | Payer: Self-pay | Source: Home / Self Care | Attending: Emergency Medicine

## 2016-03-17 ENCOUNTER — Observation Stay
Admission: EM | Admit: 2016-03-17 | Discharge: 2016-03-18 | Disposition: A | Discharge status: Home / Self Care | Payer: Medicare Other | Attending: Internal Medicine | Admitting: Internal Medicine

## 2016-03-17 ENCOUNTER — Emergency Department: Payer: Medicare Other

## 2016-03-17 DIAGNOSIS — E1122 Type 2 diabetes mellitus with diabetic chronic kidney disease: Secondary | ICD-10-CM | POA: Insufficient documentation

## 2016-03-17 DIAGNOSIS — Z7982 Long term (current) use of aspirin: Secondary | ICD-10-CM | POA: Diagnosis not present

## 2016-03-17 DIAGNOSIS — N186 End stage renal disease: Secondary | ICD-10-CM | POA: Diagnosis not present

## 2016-03-17 DIAGNOSIS — I132 Hypertensive heart and chronic kidney disease with heart failure and with stage 5 chronic kidney disease, or end stage renal disease: Secondary | ICD-10-CM | POA: Insufficient documentation

## 2016-03-17 DIAGNOSIS — Z992 Dependence on renal dialysis: Secondary | ICD-10-CM | POA: Diagnosis not present

## 2016-03-17 DIAGNOSIS — Q613 Polycystic kidney, unspecified: Secondary | ICD-10-CM | POA: Diagnosis not present

## 2016-03-17 DIAGNOSIS — D631 Anemia in chronic kidney disease: Secondary | ICD-10-CM | POA: Insufficient documentation

## 2016-03-17 DIAGNOSIS — I209 Angina pectoris, unspecified: Secondary | ICD-10-CM | POA: Diagnosis present

## 2016-03-17 DIAGNOSIS — I5032 Chronic diastolic (congestive) heart failure: Secondary | ICD-10-CM | POA: Insufficient documentation

## 2016-03-17 DIAGNOSIS — K219 Gastro-esophageal reflux disease without esophagitis: Secondary | ICD-10-CM | POA: Diagnosis not present

## 2016-03-17 DIAGNOSIS — E1140 Type 2 diabetes mellitus with diabetic neuropathy, unspecified: Secondary | ICD-10-CM | POA: Insufficient documentation

## 2016-03-17 DIAGNOSIS — M1990 Unspecified osteoarthritis, unspecified site: Secondary | ICD-10-CM | POA: Diagnosis not present

## 2016-03-17 DIAGNOSIS — Z888 Allergy status to other drugs, medicaments and biological substances status: Secondary | ICD-10-CM | POA: Diagnosis not present

## 2016-03-17 DIAGNOSIS — Z885 Allergy status to narcotic agent status: Secondary | ICD-10-CM | POA: Insufficient documentation

## 2016-03-17 DIAGNOSIS — J449 Chronic obstructive pulmonary disease, unspecified: Secondary | ICD-10-CM | POA: Insufficient documentation

## 2016-03-17 DIAGNOSIS — Z87891 Personal history of nicotine dependence: Secondary | ICD-10-CM | POA: Diagnosis not present

## 2016-03-17 DIAGNOSIS — E785 Hyperlipidemia, unspecified: Secondary | ICD-10-CM | POA: Insufficient documentation

## 2016-03-17 DIAGNOSIS — Z79899 Other long term (current) drug therapy: Secondary | ICD-10-CM | POA: Insufficient documentation

## 2016-03-17 DIAGNOSIS — N2581 Secondary hyperparathyroidism of renal origin: Secondary | ICD-10-CM | POA: Diagnosis not present

## 2016-03-17 DIAGNOSIS — I200 Unstable angina: Principal | ICD-10-CM | POA: Insufficient documentation

## 2016-03-17 DIAGNOSIS — I214 Non-ST elevation (NSTEMI) myocardial infarction: Secondary | ICD-10-CM | POA: Diagnosis present

## 2016-03-17 DIAGNOSIS — Z8249 Family history of ischemic heart disease and other diseases of the circulatory system: Secondary | ICD-10-CM | POA: Insufficient documentation

## 2016-03-17 DIAGNOSIS — I509 Heart failure, unspecified: Secondary | ICD-10-CM | POA: Diagnosis present

## 2016-03-17 HISTORY — DX: Unspecified diastolic (congestive) heart failure: I50.30

## 2016-03-17 LAB — COMPREHENSIVE METABOLIC PANEL
ALBUMIN: 3.5 g/dL (ref 3.5–5.0)
ALT: 28 U/L (ref 14–54)
AST: 29 U/L (ref 15–41)
Alkaline Phosphatase: 57 U/L (ref 38–126)
Anion gap: 9 (ref 5–15)
BILIRUBIN TOTAL: 0.5 mg/dL (ref 0.3–1.2)
BUN: 29 mg/dL — AB (ref 6–20)
CO2: 33 mmol/L — ABNORMAL HIGH (ref 22–32)
CREATININE: 6.81 mg/dL — AB (ref 0.44–1.00)
Calcium: 8.5 mg/dL — ABNORMAL LOW (ref 8.9–10.3)
Chloride: 102 mmol/L (ref 101–111)
GFR calc Af Amer: 7 mL/min — ABNORMAL LOW (ref >60)
GFR, EST NON AFRICAN AMERICAN: 6 mL/min — AB (ref >60)
GLUCOSE: 98 mg/dL (ref 65–99)
POTASSIUM: 3.8 mmol/L (ref 3.5–5.1)
Sodium: 144 mmol/L (ref 135–145)
TOTAL PROTEIN: 7.2 g/dL (ref 6.5–8.1)

## 2016-03-17 LAB — CBC
HCT: 32.4 % — ABNORMAL LOW (ref 35.0–47.0)
HEMATOCRIT: 31.5 % — AB (ref 35.0–47.0)
Hemoglobin: 10.4 g/dL — ABNORMAL LOW (ref 12.0–16.0)
Hemoglobin: 10.6 g/dL — ABNORMAL LOW (ref 12.0–16.0)
MCH: 29.1 pg (ref 26.0–34.0)
MCH: 29 pg (ref 26.0–34.0)
MCHC: 33 g/dL (ref 32.0–36.0)
MCHC: 32.8 g/dL (ref 32.0–36.0)
MCV: 88.2 fL (ref 80.0–100.0)
MCV: 88.4 fL (ref 80.0–100.0)
PLATELETS: 231 10*3/uL (ref 150–440)
PLATELETS: 245 10*3/uL (ref 150–440)
RBC: 3.57 MIL/uL — ABNORMAL LOW (ref 3.80–5.20)
RBC: 3.67 MIL/uL — ABNORMAL LOW (ref 3.80–5.20)
RDW: 20.1 % — AB (ref 11.5–14.5)
RDW: 20.8 % — AB (ref 11.5–14.5)
WBC: 7 10*3/uL (ref 3.6–11.0)
WBC: 8.5 10*3/uL (ref 3.6–11.0)

## 2016-03-17 LAB — BASIC METABOLIC PANEL
ANION GAP: 10 (ref 5–15)
BUN: 32 mg/dL — ABNORMAL HIGH (ref 6–20)
CHLORIDE: 101 mmol/L (ref 101–111)
CO2: 32 mmol/L (ref 22–32)
Calcium: 8.7 mg/dL — ABNORMAL LOW (ref 8.9–10.3)
Creatinine, Ser: 7.58 mg/dL — ABNORMAL HIGH (ref 0.44–1.00)
GFR calc Af Amer: 6 mL/min — ABNORMAL LOW (ref >60)
GFR, EST NON AFRICAN AMERICAN: 5 mL/min — AB (ref >60)
GLUCOSE: 98 mg/dL (ref 65–99)
POTASSIUM: 4 mmol/L (ref 3.5–5.1)
SODIUM: 143 mmol/L (ref 135–145)

## 2016-03-17 LAB — PROTIME-INR
INR: 1.08
PROTHROMBIN TIME: 14 s (ref 11.4–15.2)

## 2016-03-17 LAB — TROPONIN I
TROPONIN I: 0.04 ng/mL — AB (ref <0.03)
Troponin I: 0.04 ng/mL (ref <0.03)
Troponin I: 0.03 ng/mL (ref <0.03)
Troponin I: 0.04 ng/mL (ref <0.03)

## 2016-03-17 LAB — LIPID PANEL
CHOL/HDL RATIO: 2.6 ratio
CHOLESTEROL: 151 mg/dL (ref 0–200)
HDL: 57 mg/dL (ref >40)
LDL Cholesterol: 85 mg/dL (ref 0–99)
Triglycerides: 46 mg/dL (ref <150)
VLDL: 9 mg/dL (ref 0–40)

## 2016-03-17 LAB — MRSA PCR SCREENING: MRSA BY PCR: NEGATIVE

## 2016-03-17 LAB — APTT: aPTT: 36 seconds (ref 24–36)

## 2016-03-17 SURGERY — CORONARY ANGIOGRAM
Anesthesia: Moderate Sedation | Laterality: Right

## 2016-03-17 MED ORDER — ASPIRIN EC 81 MG PO TBEC: 81.0000 mg | DELAYED_RELEASE_TABLET | Freq: Every day | ORAL | Status: DC

## 2016-03-17 MED ORDER — IRBESARTAN 75 MG PO TABS
150.0000 mg | ORAL_TABLET | Freq: Every day | ORAL | Status: DC
  Administered 2016-03-17: 150 mg via ORAL
  Filled 2016-03-17: qty 2

## 2016-03-17 MED ORDER — PANTOPRAZOLE SODIUM 40 MG PO TBEC
40.0000 mg | DELAYED_RELEASE_TABLET | Freq: Every day | ORAL | Status: DC
  Administered 2016-03-17 – 2016-03-18 (×2): 40 mg via ORAL
  Filled 2016-03-17 (×2): qty 1

## 2016-03-17 MED ORDER — HEPARIN BOLUS VIA INFUSION
4000.0000 [IU] | Freq: Once | INTRAVENOUS | Status: AC
  Administered 2016-03-17: 4000 [IU] via INTRAVENOUS
  Filled 2016-03-17: qty 4000

## 2016-03-17 MED ORDER — CLONIDINE HCL 0.1 MG PO TABS
0.1000 mg | ORAL_TABLET | Freq: Two times a day (BID) | ORAL | Status: DC
  Administered 2016-03-17 – 2016-03-18 (×3): 0.1 mg via ORAL
  Filled 2016-03-17 (×3): qty 1

## 2016-03-17 MED ORDER — ONDANSETRON HCL 4 MG/2ML IJ SOLN
4.0000 mg | Freq: Four times a day (QID) | INTRAMUSCULAR | Status: DC | PRN
  Administered 2016-03-17 – 2016-03-18 (×2): 4 mg via INTRAVENOUS
  Filled 2016-03-17: qty 2

## 2016-03-17 MED ORDER — CALCIUM ACETATE (PHOS BINDER) 667 MG PO CAPS
667.0000 mg | ORAL_CAPSULE | Freq: Three times a day (TID) | ORAL | Status: DC
  Administered 2016-03-17 – 2016-03-18 (×3): 667 mg via ORAL
  Filled 2016-03-17 (×3): qty 1

## 2016-03-17 MED ORDER — SODIUM CHLORIDE 0.9% FLUSH: 3.0000 mL | INTRAVENOUS | Status: DC | PRN

## 2016-03-17 MED ORDER — SODIUM CHLORIDE 0.9% FLUSH
3.0000 mL | Freq: Two times a day (BID) | INTRAVENOUS | Status: DC
  Administered 2016-03-17: 3 mL via INTRAVENOUS

## 2016-03-17 MED ORDER — METOPROLOL SUCCINATE ER 50 MG PO TB24
50.0000 mg | ORAL_TABLET | Freq: Every day | ORAL | Status: DC
  Administered 2016-03-17 – 2016-03-18 (×2): 50 mg via ORAL
  Filled 2016-03-17 (×2): qty 1

## 2016-03-17 MED ORDER — ASPIRIN 81 MG PO CHEW
324.0000 mg | CHEWABLE_TABLET | Freq: Once | ORAL | Status: AC
  Administered 2016-03-17: 324 mg via ORAL
  Filled 2016-03-17: qty 4

## 2016-03-17 MED ORDER — FENTANYL CITRATE (PF) 100 MCG/2ML IJ SOLN
INTRAMUSCULAR | Status: DC | PRN
  Administered 2016-03-17: 25 ug via INTRAVENOUS

## 2016-03-17 MED ORDER — ROSUVASTATIN CALCIUM 10 MG PO TABS
5.0000 mg | ORAL_TABLET | Freq: Every day | ORAL | Status: DC
  Administered 2016-03-17 – 2016-03-18 (×2): 5 mg via ORAL
  Filled 2016-03-17 (×2): qty 1

## 2016-03-17 MED ORDER — FENTANYL CITRATE (PF) 100 MCG/2ML IJ SOLN
INTRAMUSCULAR | Status: AC
  Filled 2016-03-17: qty 2

## 2016-03-17 MED ORDER — OXYCODONE-ACETAMINOPHEN 7.5-325 MG PO TABS
1.0000 | ORAL_TABLET | Freq: Three times a day (TID) | ORAL | Status: DC | PRN
  Administered 2016-03-17 (×2): 1 via ORAL
  Filled 2016-03-17 (×4): qty 1

## 2016-03-17 MED ORDER — SODIUM CHLORIDE 0.9 % WEIGHT BASED INFUSION: 3.0000 mL/kg/h | INTRAVENOUS | Status: DC

## 2016-03-17 MED ORDER — SODIUM CHLORIDE 0.9% FLUSH
3.0000 mL | Freq: Two times a day (BID) | INTRAVENOUS | Status: DC
  Administered 2016-03-17 – 2016-03-18 (×2): 3 mL via INTRAVENOUS

## 2016-03-17 MED ORDER — AMLODIPINE BESYLATE 10 MG PO TABS
10.0000 mg | ORAL_TABLET | Freq: Every day | ORAL | Status: DC
  Administered 2016-03-17: 10 mg via ORAL
  Filled 2016-03-17: qty 1

## 2016-03-17 MED ORDER — RENA-VITE PO TABS
1.0000 | ORAL_TABLET | Freq: Every day | ORAL | Status: DC
  Administered 2016-03-17: 1 via ORAL
  Filled 2016-03-17: qty 1

## 2016-03-17 MED ORDER — GABAPENTIN 300 MG PO CAPS
300.0000 mg | ORAL_CAPSULE | Freq: Two times a day (BID) | ORAL | Status: DC
  Administered 2016-03-17 – 2016-03-18 (×3): 300 mg via ORAL
  Filled 2016-03-17 (×3): qty 1

## 2016-03-17 MED ORDER — ASPIRIN 81 MG PO CHEW
81.0000 mg | CHEWABLE_TABLET | Freq: Every day | ORAL | Status: DC
  Administered 2016-03-17 – 2016-03-18 (×2): 81 mg via ORAL
  Filled 2016-03-17 (×2): qty 1

## 2016-03-17 MED ORDER — MIDAZOLAM HCL 2 MG/2ML IJ SOLN
INTRAMUSCULAR | Status: DC | PRN
  Administered 2016-03-17: 1 mg via INTRAVENOUS

## 2016-03-17 MED ORDER — SODIUM CHLORIDE 0.9% FLUSH: 3.0000 mL | Freq: Two times a day (BID) | INTRAVENOUS | Status: DC

## 2016-03-17 MED ORDER — HEPARIN (PORCINE) IN NACL 100-0.45 UNIT/ML-% IJ SOLN
850.0000 [IU]/h | INTRAMUSCULAR | Status: DC
  Administered 2016-03-17: 850 [IU]/h via INTRAVENOUS
  Filled 2016-03-17 (×2): qty 250

## 2016-03-17 MED ORDER — HEPARIN (PORCINE) IN NACL 2-0.9 UNIT/ML-% IJ SOLN
INTRAMUSCULAR | Status: AC
  Filled 2016-03-17: qty 500

## 2016-03-17 MED ORDER — SODIUM CHLORIDE 0.9 % IV SOLN: 250.0000 mL | INTRAVENOUS | Status: DC | PRN

## 2016-03-17 MED ORDER — SODIUM CHLORIDE 0.9 % WEIGHT BASED INFUSION: 1.0000 mL/kg/h | INTRAVENOUS | Status: AC

## 2016-03-17 MED ORDER — CINACALCET HCL 30 MG PO TABS
30.0000 mg | ORAL_TABLET | Freq: Every day | ORAL | Status: DC
  Administered 2016-03-17 – 2016-03-18 (×2): 30 mg via ORAL
  Filled 2016-03-17 (×2): qty 1

## 2016-03-17 MED ORDER — NITROGLYCERIN 0.4 MG SL SUBL: 0.4000 mg | SUBLINGUAL_TABLET | SUBLINGUAL | Status: DC | PRN

## 2016-03-17 MED ORDER — ACETAMINOPHEN 325 MG PO TABS: 650.0000 mg | ORAL_TABLET | ORAL | Status: DC | PRN

## 2016-03-17 MED ORDER — MIDAZOLAM HCL 2 MG/2ML IJ SOLN
INTRAMUSCULAR | Status: AC
  Filled 2016-03-17: qty 2

## 2016-03-17 MED ORDER — ALBUTEROL SULFATE (2.5 MG/3ML) 0.083% IN NEBU
3.0000 mL | INHALATION_SOLUTION | Freq: Four times a day (QID) | RESPIRATORY_TRACT | Status: DC | PRN
  Administered 2016-03-18 (×2): 3 mL via RESPIRATORY_TRACT
  Filled 2016-03-17 (×2): qty 3

## 2016-03-17 MED ORDER — SODIUM CHLORIDE 0.9 % WEIGHT BASED INFUSION: 1.0000 mL/kg/h | INTRAVENOUS | Status: DC

## 2016-03-17 MED ORDER — ASPIRIN 81 MG PO CHEW: 81.0000 mg | CHEWABLE_TABLET | ORAL | Status: DC

## 2016-03-17 MED ORDER — IOPAMIDOL (ISOVUE-300) INJECTION 61%
INTRAVENOUS | Status: DC | PRN
  Administered 2016-03-17: 70 mL via INTRA_ARTERIAL

## 2016-03-17 MED ORDER — ACETAMINOPHEN 325 MG PO TABS
650.0000 mg | ORAL_TABLET | ORAL | Status: DC | PRN
  Administered 2016-03-18 (×2): 650 mg via ORAL
  Filled 2016-03-17 (×2): qty 2

## 2016-03-17 MED ORDER — ONDANSETRON HCL 4 MG/2ML IJ SOLN: 4.0000 mg | Freq: Four times a day (QID) | INTRAMUSCULAR | Status: DC | PRN

## 2016-03-17 MED ORDER — FUROSEMIDE 10 MG/ML IJ SOLN
40.0000 mg | Freq: Once | INTRAMUSCULAR | Status: AC
  Administered 2016-03-17: 40 mg via INTRAVENOUS
  Filled 2016-03-17: qty 4

## 2016-03-17 SURGICAL SUPPLY — 9 items
CATH INFINITI 5FR ANG PIGTAIL (CATHETERS) IMPLANT
CATH INFINITI 5FR JL4 (CATHETERS) ×4 IMPLANT
CATH INFINITI JR4 5F (CATHETERS) ×4 IMPLANT
DEVICE CLOSURE MYNXGRIP 5F (Vascular Products) ×4 IMPLANT
KIT MANI 3VAL PERCEP (MISCELLANEOUS) ×4 IMPLANT
NEEDLE PERC 18GX7CM (NEEDLE) ×4 IMPLANT
PACK CARDIAC CATH (CUSTOM PROCEDURE TRAY) ×4 IMPLANT
SHEATH PINNACLE 5F 10CM (SHEATH) ×4 IMPLANT
WIRE EMERALD 3MM-J .035X150CM (WIRE) ×4 IMPLANT

## 2016-03-17 NOTE — Care Management Obs Status (Signed)
[facility] NOTIFICATION   Patient Details  Name: Jasmine Holloway MRN: [id number] Date of Birth: 04-01-1961   Medicare Observation Status Notification Given:  Yes    Jolly Mango, RN 03/17/2016, 9:44 AM

## 2016-03-17 NOTE — Progress Notes (Addendum)
Pt remains clinically stable post cardiac cath, no bleeding nor hematoma at right groin site post manul hold, taking po's without difficutly, Dr Ubaldo Glassing out earlier to speak with wife, discharge instru tions given with questions answered,rtc given with questions answered.                                                                                                                                            This note was placed on wrong patient

## 2016-03-17 NOTE — ED Notes (Signed)
Called lab and APTT added on to sample already collected

## 2016-03-17 NOTE — ED Notes (Signed)
Pt c/o shortness of breath and is panicked - talked through breathing in through nose and out through mouth to calm pt - 02 sat 92% - placed on 2L of 02 via n/c - 02 Sat came up to 98% and pt states relief from shortness of breath

## 2016-03-17 NOTE — Progress Notes (Signed)
ANTICOAGULATION CONSULT NOTE - Initial Consult  Pharmacy Consult for heparin drip Indication: chest pain, elevated troponin  Allergies  Allergen Reactions  . Morphine And Related Nausea And Vomiting  . Vicodin [Hydrocodone-Acetaminophen] Nausea And Vomiting  . Buprenorphine Hcl Nausea And Vomiting  . Codeine Nausea And Vomiting    Patient Measurements: Height: 5\' 3"  (160 cm) Weight: 181 lb (82.1 kg) IBW/kg (Calculated) : 52.4 Heparin Dosing Weight: 70 kg  Vital Signs: Temp: 98.2 F (36.8 C) (08/08 0109) Temp Source: Oral (08/08 0109) BP: 161/105 (08/08 0430) Pulse Rate: 65 (08/08 0500)  Labs:  Recent Labs  03/17/16 0206 03/17/16 0207  HGB 10.4*  --   HCT 31.5*  --   PLT 231  --   APTT  --  36  LABPROT  --  14.0  INR  --  1.08  CREATININE 6.81*  --   TROPONINI 0.04*  --     Estimated Creatinine Clearance: 9.3 mL/min (by C-G formula based on SCr of 6.81 mg/dL).   Medical History: Past Medical History:  Diagnosis Date  . Asthma   . COPD (chronic obstructive pulmonary disease) ([place])   . Diabetes mellitus without complication ([place])   . Diastolic heart failure ([place])   . ESRD (end stage renal disease) on dialysis ([place])   . Hypertension   . Neuropathy ([place])   . Polycystic kidney disease   . Renal insufficiency     Medications:  No anticoagulation in current PTA meds.  Assessment:  Goal of Therapy:  Heparin level 0.3-0.7 units/ml Monitor platelets by anticoagulation protocol: Yes   Plan:  4000 unit bolus and initial rate of 850 units/hr. First heparin level 8 hours after start of infusion.  Lash Matulich S 03/17/2016,5:20 AM

## 2016-03-17 NOTE — ED Notes (Signed)
Pt is a dialysis pt - c/o shortness of breath for 3 days and it is hard for her to lay down and chest pain for 24 hours - chest pain feels like elephant is sitting on her chest

## 2016-03-17 NOTE — Plan of Care (Signed)
Problem: Safety: Goal: Ability to remain free from injury will improve Outcome: Progressing Educated on safety, alarm not indicated, agrees to call for assistance.

## 2016-03-17 NOTE — Progress Notes (Signed)
Arrival Method: via stretcher with ED tech Mental Orientation:  A&O Telemetry: [id number] Skin: Intact, scars on abdomen, verified by Arman Bogus, RN IV: 22g left hand, 22g left wrist, right arm fistula Pain: chest pressure 8 out of 10, to give oxycodone Tubes: 2L O2 acute Safety Measures: Safety Fall Prevention Plan has been given, discussed & signed, non skid socks in place, bed alarm activated. 2A Orientation: Patient has been orientated to the room, unit & staff.  Family: Has been informed of plan of care.  Orders have been reviewed & implemented. Will continue to monitor the patient. Call light has been placed within reach.  Georgian Co, RN

## 2016-03-17 NOTE — ED Notes (Signed)
Lab called with critical troponin of 0.04 - reported to Dr Karma Greaser

## 2016-03-17 NOTE — ED Triage Notes (Signed)
x3 days of worsening SHOB , midsternal chest pain (pressure) "an elephant sitting on my chest "  "took a liter off in dialysis today,  Pt in distress with wheezing and accessory muscle use.

## 2016-03-17 NOTE — Progress Notes (Signed)
Jasmine Holloway is a 55 y.o. female  [id number]  Primary Cardiologist: Neoma Laming Reason for Consultation: Chest pain  HPI: This is a 55 year old African-American female presented to the hospital during dialysis of chest pain. Chest pain was described as pressure type and tightness in the chest associated with shortness of breath. She still having some tightness in the chest and troponin is mildly elevated.   Review of Systems: No orthopnea PND or leg swelling.   Past Medical History:  Diagnosis Date  . Asthma   . COPD (chronic obstructive pulmonary disease) ([place])   . Diabetes mellitus without complication ([place])   . Diastolic heart failure ([place])   . ESRD (end stage renal disease) on dialysis ([place])   . Hypertension   . Neuropathy ([place])   . Polycystic kidney disease   . Renal insufficiency     Medications Prior to Admission  Medication Sig Dispense Refill  . albuterol (PROVENTIL HFA;VENTOLIN HFA) 108 (90 Base) MCG/ACT inhaler Inhale 2 puffs into the lungs every 6 (six) hours as needed for wheezing or shortness of breath. 3 Inhaler 3  . amLODipine (NORVASC) 10 MG tablet Take 1 tablet (10 mg total) by mouth at bedtime. 30 tablet 2  . aspirin 81 MG chewable tablet Chew 1 tablet (81 mg total) by mouth daily. 30 tablet 2  . b complex-vitamin c-folic acid (NEPHRO-VITE) 0.8 MG TABS tablet Take 1 tablet by mouth at bedtime.    . calcium acetate (PHOSLO) 667 MG capsule Take 667 mg by mouth 3 (three) times daily with meals.     . cinacalcet (SENSIPAR) 30 MG tablet Take 30 mg by mouth daily.    . cloNIDine (CATAPRES) 0.1 MG tablet Take 1 tablet (0.1 mg total) by mouth 2 (two) times daily. 60 tablet 2  . furosemide (LASIX) 80 MG tablet Take 1 tablet (80 mg total) by mouth daily. 90 tablet 3  . gabapentin (NEURONTIN) 300 MG capsule Take 300 mg by mouth 2 (two) times daily.     . irbesartan (AVAPRO) 150 MG tablet Take 1 tablet (150 mg total) by mouth at bedtime. 30 tablet 2  . metoprolol  succinate (TOPROL-XL) 50 MG 24 hr tablet Take 50 mg by mouth daily. Take with or immediately following a meal.    . omeprazole (PRILOSEC) 20 MG capsule Take 40 mg by mouth daily.     Marland Kitchen oxyCODONE-acetaminophen (PERCOCET) 7.5-325 MG tablet Take 1 tablet by mouth every 8 (eight) hours as needed for moderate pain. (Patient taking differently: Take 1 tablet by mouth daily as needed for moderate pain. ) 20 tablet 0  . rosuvastatin (CRESTOR) 5 MG tablet Take 5 mg by mouth daily.       Marland Kitchen amLODipine  10 mg Oral QHS  . aspirin  81 mg Oral Daily  . calcium acetate  667 mg Oral TID WC  . cinacalcet  30 mg Oral Daily  . cloNIDine  0.1 mg Oral BID  . gabapentin  300 mg Oral BID  . irbesartan  150 mg Oral QHS  . metoprolol succinate  50 mg Oral Daily  . multivitamin  1 tablet Oral QHS  . pantoprazole  40 mg Oral Daily  . rosuvastatin  5 mg Oral Daily  . sodium chloride flush  3 mL Intravenous Q12H    Infusions: . heparin 850 Units/hr (03/17/16 0744)    Allergies  Allergen Reactions  . Morphine And Related Nausea And Vomiting  . Vicodin [Hydrocodone-Acetaminophen] Nausea And Vomiting  .  Buprenorphine Hcl Nausea And Vomiting  . Codeine Nausea And Vomiting    Social History   Social History  . Marital status: Single    Spouse name: N/A  . Number of children: N/A  . Years of education: N/A   Occupational History  . disabled    Social History Main Topics  . Smoking status: Former Smoker    Packs/day: 0.25    Quit date: 08/11/2015  . Smokeless tobacco: Never Used  . Alcohol use No  . Drug use: No  . Sexual activity: Not on file   Other Topics Concern  . Not on file   Social History Narrative   Lives alone    Family History  Problem Relation Age of Onset  . Hypertension Brother     PHYSICAL EXAM: Vitals:   03/17/16 0545 03/17/16 0634  BP:  (!) 169/90  Pulse: 68 67  Resp:  16  Temp:  98 F (36.7 C)     Intake/Output Summary (Last 24 hours) at 03/17/16 0858 Last  data filed at 03/17/16 0758  Gross per 24 hour  Intake                0 ml  Output              350 ml  Net             -350 ml    General:  Well appearing. No respiratory difficulty HEENT: normal Neck: supple. no JVD. Carotids 2+ bilat; no bruits. No lymphadenopathy or thryomegaly appreciated. Cor: PMI nondisplaced. Regular rate & rhythm. No rubs, gallops or murmurs. Lungs: clear Abdomen: soft, nontender, nondistended. No hepatosplenomegaly. No bruits or masses. Good bowel sounds. Extremities: no cyanosis, clubbing, rash, edema Neuro: alert & oriented x 3, cranial nerves grossly intact. moves all 4 extremities w/o difficulty. Affect pleasant.  ECG: Normal sinus rhythm poor R-wave progression no acute ST changes.  Results for orders placed or performed during the hospital encounter of 03/17/16 (from the past 24 hour(s))  Comprehensive metabolic panel     Status: Abnormal   Collection Time: 03/17/16  2:06 AM  Result Value Ref Range   Sodium 144 135 - 145 mmol/L   Potassium 3.8 3.5 - 5.1 mmol/L   Chloride 102 101 - 111 mmol/L   CO2 33 (H) 22 - 32 mmol/L   Glucose, Bld 98 65 - 99 mg/dL   BUN 29 (H) 6 - 20 mg/dL   Creatinine, Ser 6.81 (H) 0.44 - 1.00 mg/dL   Calcium 8.5 (L) 8.9 - 10.3 mg/dL   Total Protein 7.2 6.5 - 8.1 g/dL   Albumin 3.5 3.5 - 5.0 g/dL   AST 29 15 - 41 U/L   ALT 28 14 - 54 U/L   Alkaline Phosphatase 57 38 - 126 U/L   Total Bilirubin 0.5 0.3 - 1.2 mg/dL   GFR calc non Af Amer 6 (L) >60 mL/min   GFR calc Af Amer 7 (L) >60 mL/min   Anion gap 9 5 - 15  CBC     Status: Abnormal   Collection Time: 03/17/16  2:06 AM  Result Value Ref Range   WBC 7.0 3.6 - 11.0 K/uL   RBC 3.57 (L) 3.80 - 5.20 MIL/uL   Hemoglobin 10.4 (L) 12.0 - 16.0 g/dL   HCT 31.5 (L) 35.0 - 47.0 %   MCV 88.2 80.0 - 100.0 fL   MCH 29.1 26.0 - 34.0 pg   MCHC 33.0 32.0 - 36.0 g/dL  RDW 20.1 (H) 11.5 - 14.5 %   Platelets 231 150 - 440 K/uL  Troponin I     Status: Abnormal   Collection Time:  03/17/16  2:06 AM  Result Value Ref Range   Troponin I 0.04 (HH) <0.03 ng/mL  Protime-INR     Status: None   Collection Time: 03/17/16  2:07 AM  Result Value Ref Range   Prothrombin Time 14.0 11.4 - 15.2 seconds   INR 1.08   APTT     Status: None   Collection Time: 03/17/16  2:07 AM  Result Value Ref Range   aPTT 36 24 - 36 seconds  MRSA PCR Screening     Status: None   Collection Time: 03/17/16  6:45 AM  Result Value Ref Range   MRSA by PCR NEGATIVE NEGATIVE  Troponin I     Status: Abnormal   Collection Time: 03/17/16  8:01 AM  Result Value Ref Range   Troponin I 0.04 (HH) <0.03 ng/mL  Basic metabolic panel     Status: Abnormal   Collection Time: 03/17/16  8:01 AM  Result Value Ref Range   Sodium 143 135 - 145 mmol/L   Potassium 4.0 3.5 - 5.1 mmol/L   Chloride 101 101 - 111 mmol/L   CO2 32 22 - 32 mmol/L   Glucose, Bld 98 65 - 99 mg/dL   BUN 32 (H) 6 - 20 mg/dL   Creatinine, Ser 7.58 (H) 0.44 - 1.00 mg/dL   Calcium 8.7 (L) 8.9 - 10.3 mg/dL   GFR calc non Af Amer 5 (L) >60 mL/min   GFR calc Af Amer 6 (L) >60 mL/min   Anion gap 10 5 - 15  Lipid panel     Status: None   Collection Time: 03/17/16  8:01 AM  Result Value Ref Range   Cholesterol 151 0 - 200 mg/dL   Triglycerides 46 <150 mg/dL   HDL 57 >40 mg/dL   Total CHOL/HDL Ratio 2.6 RATIO   VLDL 9 0 - 40 mg/dL   LDL Cholesterol 85 0 - 99 mg/dL  CBC     Status: Abnormal   Collection Time: 03/17/16  8:01 AM  Result Value Ref Range   WBC 8.5 3.6 - 11.0 K/uL   RBC 3.67 (L) 3.80 - 5.20 MIL/uL   Hemoglobin 10.6 (L) 12.0 - 16.0 g/dL   HCT 32.4 (L) 35.0 - 47.0 %   MCV 88.4 80.0 - 100.0 fL   MCH 29.0 26.0 - 34.0 pg   MCHC 32.8 32.0 - 36.0 g/dL   RDW 20.8 (H) 11.5 - 14.5 %   Platelets 245 150 - 440 K/uL   Dg Chest 2 View  Result Date: 03/17/2016 CLINICAL DATA:  Chest pain and difficulty breathing. Progressive shortness of breath for 3 days. Midsternal chest pain. EXAM: CHEST  2 VIEW COMPARISON:  Most recent chest  radiograph 01/15/2016, chest CT 01/10/2016 FINDINGS: Stable cardiomegaly. Development of pulmonary edema since prior exam. There is fissural thickening and small left pleural effusion. Bibasilar atelectasis, no confluent airspace disease. Surgical clips in the left axilla and subclavian region. IMPRESSION: Findings consistent with CHF, mild-to-moderate in degree. Electronically Signed   By: Jeb Levering M.D.   On: 03/17/2016 01:52     ASSESSMENT AND PLAN: Unstable angina with mildly elevated troponin and ongoing intermittent pressure in the chest. Patient states that she had a stress test in February this year which was negative. She has multiple risk factors for coronary artery disease thus will  schedule cardiac catheterization today.  KHAN,SHAUKAT A

## 2016-03-17 NOTE — Progress Notes (Signed)
Patient complaining of pain at vascular site, worse with palpation and movement. Site assessed by 2 RNs including charge nurse. Site is soft, no signs of bleeding, bruising or hematoma. Patient rolled to side, no signs of bleeding when looking at flank and lower back regions. PRN pain medications given. Family at bedside. Will continue to monitor.

## 2016-03-17 NOTE — Plan of Care (Signed)
Problem: Education: Goal: Knowledge of [facility] Education information/materials will improve Outcome: Progressing Oriented to room, staff, pain scale. Aware of plan of care and has seen MDs.

## 2016-03-17 NOTE — Progress Notes (Signed)
Per Dr. Humphrey Rolls, patient to have cath today -Ok to hold off on pre-cath fluids and let them start in the procedural area since patient is on dialysis and lungs are not clear. Will make patient NPO for cath this afternoon.

## 2016-03-17 NOTE — ED Provider Notes (Signed)
[facility] Emergency Department Provider Note  ____________________________________________   First MD Initiated Contact with Patient 03/17/16 0216     (approximate)  I have reviewed the triage vital signs and the nursing notes.   HISTORY  Chief Complaint Chest Pain and Respiratory Distress    HPI Jasmine Holloway is a 55 y.o. female with extensive chronic medical problems including COPD, diabetes, end-stage renal disease on dialysis on Monday, Wednesday, and Friday, hypertension, and CHF who presents with gradually worsening shortness of breath over the last 3 days.  She reports that she had dialysis today and she is supposed to and interactive off 1 L.  However her shortness of breath got much worse tonight and she also had acute onset chest pain there was both sharp and heavy.  She states that this does happen at times when she is feeling short of breath but that it was worse than usual.  She was still having chest pain when she first arrived and had her initial EKG.  At this time the chest pain has resolved but she still feeling somewhat short of breath.  She denies nausea, vomiting, diarrhea, abdominal pain.  She still makes urine and urinate regularly during the day.   Past Medical History:  Diagnosis Date  . Asthma   . COPD (chronic obstructive pulmonary disease) ([place])   . Diabetes mellitus without complication ([place])   . Diastolic heart failure ([place])   . ESRD (end stage renal disease) on dialysis ([place])   . Hypertension   . Neuropathy ([place])   . Polycystic kidney disease   . Renal insufficiency     Patient Active Problem List   Diagnosis Date Noted  . Unstable angina ([place]) 03/17/2016  . Chronic diastolic heart failure ([place]) 02/04/2016  . Hoarseness of voice 02/04/2016  . ESRD on dialysis ([place]) 12/22/2015  . Benign essential HTN 12/22/2015  . Hyperlipidemia 12/22/2015    Past Surgical History:  Procedure Laterality Date  . ARTERIOVENOUS GRAFT  PLACEMENT Right    x3 (R forearm currently used for access)  . COLON SURGERY    . VEIN HARVEST      Prior to Admission medications   Medication Sig Start Date End Date Taking? Authorizing Provider  albuterol (PROVENTIL HFA;VENTOLIN HFA) 108 (90 Base) MCG/ACT inhaler Inhale 2 puffs into the lungs every 6 (six) hours as needed for wheezing or shortness of breath. 03/05/16 06/03/16  Alisa Graff, FNP  amLODipine (NORVASC) 10 MG tablet Take 1 tablet (10 mg total) by mouth at bedtime. 01/17/16   Gladstone Lighter, MD  aspirin 81 MG chewable tablet Chew 1 tablet (81 mg total) by mouth daily. 01/17/16   Gladstone Lighter, MD  b complex-vitamin c-folic acid (NEPHRO-VITE) 0.8 MG TABS tablet Take 1 tablet by mouth at bedtime.    Historical Provider, MD  calcium acetate (PHOSLO) 667 MG capsule Take 667 mg by mouth 3 (three) times daily with meals.     Historical Provider, MD  cinacalcet (SENSIPAR) 30 MG tablet Take 30 mg by mouth daily.    Historical Provider, MD  cloNIDine (CATAPRES) 0.1 MG tablet Take 1 tablet (0.1 mg total) by mouth 2 (two) times daily. 01/17/16   Gladstone Lighter, MD  furosemide (LASIX) 80 MG tablet Take 1 tablet (80 mg total) by mouth daily. 03/05/16 06/03/16  Alisa Graff, FNP  gabapentin (NEURONTIN) 300 MG capsule Take 300 mg by mouth 2 (two) times daily.     Historical Provider, MD  irbesartan (AVAPRO) 150  MG tablet Take 1 tablet (150 mg total) by mouth at bedtime. 01/17/16   Gladstone Lighter, MD  metoprolol succinate (TOPROL-XL) 50 MG 24 hr tablet Take 50 mg by mouth daily. Take with or immediately following a meal.    Historical Provider, MD  omeprazole (PRILOSEC) 20 MG capsule Take 40 mg by mouth daily.     Historical Provider, MD  oxyCODONE-acetaminophen (PERCOCET) 7.5-325 MG tablet Take 1 tablet by mouth every 8 (eight) hours as needed for moderate pain. Patient taking differently: Take 1 tablet by mouth daily as needed for moderate pain.  01/17/16   Gladstone Lighter, MD    rosuvastatin (CRESTOR) 5 MG tablet Take 5 mg by mouth daily.    Historical Provider, MD    Allergies Morphine and related; Vicodin [hydrocodone-acetaminophen]; Buprenorphine hcl; and Codeine  Family History  Problem Relation Age of Onset  . Hypertension Brother     Social History Social History  Substance Use Topics  . Smoking status: Former Smoker    Packs/day: 0.25    Quit date: 08/11/2015  . Smokeless tobacco: Never Used  . Alcohol use No    Review of Systems Constitutional: No fever/chills Eyes: No visual changes. ENT: No sore throat. Cardiovascular: +chest pain. Respiratory: +shortness of breath. Gastrointestinal: No abdominal pain.  No nausea, no vomiting.  No diarrhea.  No constipation. Genitourinary: Negative for dysuria. Musculoskeletal: Negative for back pain. Skin: Negative for rash. Neurological: Negative for headaches, focal weakness or numbness.  10-point ROS otherwise negative.  ____________________________________________   PHYSICAL EXAM:  VITAL SIGNS: ED Triage Vitals  Enc Vitals Group     BP 03/17/16 0111 (!) 171/94     Pulse Rate 03/17/16 0109 82     Resp 03/17/16 0109 (!) 24     Temp 03/17/16 0109 98.2 F (36.8 C)     Temp Source 03/17/16 0109 Oral     SpO2 03/17/16 0109 94 %     Weight 03/17/16 0109 181 lb (82.1 kg)     Height 03/17/16 0109 '5\' 3"'$  (1.6 m)     Head Circumference --      Peak Flow --      Pain Score 03/17/16 0110 10     Pain Loc --      Pain Edu? --      Excl. in [place]? --     Constitutional: Alert and oriented. Well appearing and in no acute distress. Eyes: Conjunctivae are normal. PERRL. EOMI. Head: Atraumatic. Nose: No congestion/rhinnorhea. Mouth/Throat: Mucous membranes are moist.  Oropharynx non-erythematous. Neck: No stridor.  No meningeal signs.   Cardiovascular: Normal rate, regular rhythm. Good peripheral circulation. Grossly normal heart sounds.   Respiratory: Normal respiratory effort.  No retractions.  Lungs CTAB. Gastrointestinal: Soft and nontender. No distention. PD catheter in place Musculoskeletal: No lower extremity tenderness nor edema. No gross deformities of extremities. Neurologic:  Normal speech and language. No gross focal neurologic deficits are appreciated.  Skin:  Skin is warm, dry and intact. No rash noted. Psychiatric: Mood and affect are normal. Speech and behavior are normal.  ____________________________________________   LABS (all labs ordered are listed, but only abnormal results are displayed)  Labs Reviewed  COMPREHENSIVE METABOLIC PANEL - Abnormal; Notable for the following:       Result Value   CO2 33 (*)    BUN 29 (*)    Creatinine, Ser 6.81 (*)    Calcium 8.5 (*)    GFR calc non Af Amer 6 (*)    GFR  calc Af Amer 7 (*)    All other components within normal limits  CBC - Abnormal; Notable for the following:    RBC 3.57 (*)    Hemoglobin 10.4 (*)    HCT 31.5 (*)    RDW 20.1 (*)    All other components within normal limits  TROPONIN I - Abnormal; Notable for the following:    Troponin I 0.04 (*)    All other components within normal limits  MRSA PCR SCREENING  PROTIME-INR  APTT  TROPONIN I  TROPONIN I  TROPONIN I  BASIC METABOLIC PANEL  LIPID PANEL  CBC   ____________________________________________  EKG  ED ECG REPORT I, Marquavius Scaife, the attending physician, personally viewed and interpreted this ECG.  Date: 03/17/2016 EKG Time: 01:08 Rate: 93 Rhythm: normal sinus rhythm QRS Axis: normal Intervals: normal ST/T Wave abnormalities: Approximately 1 mm of ST depression in leads V5 and V6 which appear new compared to an old EKG along with inverted T waves.  There is also approximately 1 mm of ST elevation in lead V1 and V2, but there is no diagnostic criteria met for STEMI. Conduction Disturbances: none Narrative Interpretation: Concerning for acute ischemia, but does not meet STEMI criteria  ED ECG REPORT #2 I, Misa Fedorko, the  attending physician, personally viewed and interpreted this ECG.   Date: 03/17/2016  EKG Time: 03:09  Rate: 70  Rhythm: normal sinus rhythm  Axis: Normal  Intervals:possible LVH  ST&T Change: The ST depression in V5 and V6 and ST elevation in V1 and V2 seen previously have since resolved.  The patient now has an inverted T-wave in V6 but less notable than the last one.  Inverted T-wave in lead 3 as well.   ____________________________________________  RADIOLOGY   Dg Chest 2 View  Result Date: 03/17/2016 CLINICAL DATA:  Chest pain and difficulty breathing. Progressive shortness of breath for 3 days. Midsternal chest pain. EXAM: CHEST  2 VIEW COMPARISON:  Most recent chest radiograph 01/15/2016, chest CT 01/10/2016 FINDINGS: Stable cardiomegaly. Development of pulmonary edema since prior exam. There is fissural thickening and small left pleural effusion. Bibasilar atelectasis, no confluent airspace disease. Surgical clips in the left axilla and subclavian region. IMPRESSION: Findings consistent with CHF, mild-to-moderate in degree. Electronically Signed   By: Jeb Levering M.D.   On: 03/17/2016 01:52    ____________________________________________   PROCEDURES  Procedure(s) performed:   .Critical Care Performed by: Hinda Kehr Authorized by: Hinda Kehr   Critical care provider statement:    Critical care time (minutes):  30   Critical care time was exclusive of:  Separately billable procedures and treating other patients   Critical care was necessary to treat or prevent imminent or life-threatening deterioration of the following conditions:  Cardiac failure   Critical care was time spent personally by me on the following activities:  Development of treatment plan with patient or surrogate, discussions with consultants, evaluation of patient's response to treatment, examination of patient, obtaining history from patient or surrogate, ordering and performing treatments and  interventions, ordering and review of laboratory studies, ordering and review of radiographic studies, pulse oximetry, re-evaluation of patient's condition and review of old charts     Critical Care performed: No ____________________________________________   INITIAL IMPRESSION / ASSESSMENT AND PLAN / ED COURSE  Pertinent labs & imaging results that were available during my care of the patient were reviewed by me and considered in my medical decision making (see chart for details).  Numerous co-morbidities with  chest pain, dynamic ECG changes (ST depression and mild elevation when having chest pain), and CHF on xray with orthopnea and SOB.  Giving Lasix 40 mg IV to help with CHF (patient still makes urine) and admitting for cardiology consult and serial enzymes.  Starting heparin given the probability of an STEMI/unstable angina given the significant EKG changes.  Also gave full dose ASA.    ____________________________________________  FINAL CLINICAL IMPRESSION(S) / ED DIAGNOSES  Final diagnoses:  NSTEMI (non-ST elevated myocardial infarction) ([place])  Acute congestive heart failure, unspecified congestive heart failure type ([place])  Ischemic chest pain ([place])        Note:  This document was prepared using Dragon voice recognition software and may include unintentional dictation errors.    Hinda Kehr, MD 03/17/16 0700

## 2016-03-17 NOTE — Progress Notes (Signed)
Patient had cardiac catheterization done without complications. Coronaries are normal thus chest pain is most likely acid reflux. Patient can go home in the morning with follow-up in the office. Echo was not done but if echo is not done patient still can go home and echo can be done in the office.

## 2016-03-17 NOTE — Progress Notes (Signed)
[place] [facility] [place] NAME: Jasmine Holloway    MR#:  [id number]  DATE OF BIRTH:  Nov 13, 1960  SUBJECTIVE:  CHIEF COMPLAINT:   Chief Complaint  Patient presents with  . Chest Pain  . Respiratory Distress  no more chest pain when see, waiting for cath REVIEW OF SYSTEMS:  Review of Systems  Constitutional: Negative for chills, fever and weight loss.  HENT: Negative for nosebleeds and sore throat.   Eyes: Negative for blurred vision.  Respiratory: Negative for cough, shortness of breath and wheezing.   Cardiovascular: Negative for chest pain, orthopnea, leg swelling and PND.  Gastrointestinal: Negative for abdominal pain, constipation, diarrhea, heartburn, nausea and vomiting.  Genitourinary: Negative for dysuria and urgency.  Musculoskeletal: Negative for back pain.  Skin: Negative for rash.  Neurological: Negative for dizziness, speech change, focal weakness and headaches.  Endo/Heme/Allergies: Does not bruise/bleed easily.  Psychiatric/Behavioral: Negative for depression.   DRUG ALLERGIES:   Allergies  Allergen Reactions  . Morphine And Related Nausea And Vomiting  . Vicodin [Hydrocodone-Acetaminophen] Nausea And Vomiting  . Buprenorphine Hcl Nausea And Vomiting  . Codeine Nausea And Vomiting   VITALS:  Blood pressure 121/72, pulse 64, temperature 97.9 F (36.6 C), temperature source Oral, resp. rate 20, height 5\' 3"  (1.6 m), weight 83 kg (183 lb), SpO2 93 %. PHYSICAL EXAMINATION:  Physical Exam  Constitutional: She is oriented to person, place, and time and well-developed, well-nourished, and in no distress.  HENT:  Head: Normocephalic and atraumatic.  Eyes: Conjunctivae and EOM are normal. Pupils are equal, round, and reactive to light.  Neck: Normal range of motion. Neck supple. No tracheal deviation present. No thyromegaly present.  Cardiovascular: Normal rate, regular rhythm and normal heart sounds.   Pulmonary/Chest: Effort  normal and breath sounds normal. No respiratory distress. She has no wheezes. She exhibits no tenderness.  Abdominal: Soft. Bowel sounds are normal. She exhibits no distension. There is no tenderness.  Musculoskeletal: Normal range of motion.  Neurological: She is alert and oriented to person, place, and time. No cranial nerve deficit.  Skin: Skin is warm and dry. No rash noted.  Psychiatric: Mood and affect normal.   LABORATORY PANEL:   CBC  Recent Labs Lab 03/17/16 0801  WBC 8.5  HGB 10.6*  HCT 32.4*  PLT 245   ------------------------------------------------------------------------------------------------------------------ Chemistries   Recent Labs Lab 03/17/16 0206 03/17/16 0801  NA 144 143  K 3.8 4.0  CL 102 101  CO2 33* 32  GLUCOSE 98 98  BUN 29* 32*  CREATININE 6.81* 7.58*  CALCIUM 8.5* 8.7*  AST 29  --   ALT 28  --   ALKPHOS 57  --   BILITOT 0.5  --    RADIOLOGY:  Dg Chest 2 View  Result Date: 03/17/2016 CLINICAL DATA:  Chest pain and difficulty breathing. Progressive shortness of breath for 3 days. Midsternal chest pain. EXAM: CHEST  2 VIEW COMPARISON:  Most recent chest radiograph 01/15/2016, chest CT 01/10/2016 FINDINGS: Stable cardiomegaly. Development of pulmonary edema since prior exam. There is fissural thickening and small left pleural effusion. Bibasilar atelectasis, no confluent airspace disease. Surgical clips in the left axilla and subclavian region. IMPRESSION: Findings consistent with CHF, mild-to-moderate in degree. Electronically Signed   By: Jeb Levering M.D.   On: 03/17/2016 01:52   ASSESSMENT AND PLAN:  55 year old female patient with history of end-stage renal disease on dialysis, diastolic heart failure, COPD, hypertension, diabetes and this type II presented  to the emergency room with chest pain and shortness of breath.  1. Unstable angina: getting cath today. Cardio following - echo pending - troponins neg  2. End-stage renal  disease on dialysis: Nephro c/s for HD need  3. Hypertension - BP under control  4. Chronic diastolic heart failure Well compensated at this time  5. Type 2 diabetes mellitus - sugars well controlled     All the records are reviewed and case discussed with Care Management/Social Worker. Management plans discussed with the patient, family and they are in agreement.  CODE STATUS: FULL CODE  TOTAL TIME TAKING CARE OF THIS PATIENT: 25 minutes.   More than 50% of the time was spent in counseling/coordination of care: YES  POSSIBLE D/C IN 1-2 DAYS, DEPENDING ON CLINICAL CONDITION. And cardio eval   [facility], [name] M.D on 03/17/2016 at 4:33 PM  Between 7am to 6pm - Pager - [phone]  After 6pm go to [email or web address] - Proofreader  [facility]  Office  [phone]  CC: Primary care physician; Elyse Jarvis, MD  Note: This dictation was prepared with Dragon dictation along with smaller phrase technology. Any transcriptional errors that result from this process are unintentional.

## 2016-03-17 NOTE — ED Notes (Signed)
Attempted to call pharmacy about heparin order - no answer - will attempt again

## 2016-03-17 NOTE — ED Notes (Signed)
Called pharmacy to verify that they saw heparin order - no answer

## 2016-03-17 NOTE — H&P (Signed)
[facility] NAME: Jasmine Holloway    MR#:  [id number]  DATE OF BIRTH:  07-16-1961  DATE OF ADMISSION:  03/17/2016  PRIMARY CARE PHYSICIAN: Elyse Jarvis, MD   REQUESTING/REFERRING PHYSICIAN:   CHIEF COMPLAINT:   Chief Complaint  Patient presents with  . Chest Pain  . Respiratory Distress    HISTORY OF PRESENT ILLNESS: Jasmine Holloway  is a 55 y.o. female with a known history of Diastolic heart failure, end-stage renal disease on dialysis, COPD, diabetes mellitus type 2 presented to the emergency room with chest pain since 2 days along with shortness of breath. Chest pain is located in the left side of the chest 6 out of 10 on a scale of 1-10 and sharp in nature. Patient also has shortness of breath for the last 2 days and has history of orthopnea. Patient gets dialyzed on Monday Wednesday and Friday. Her EKG in the emergency room showed ST depression in the lateral leads and EKG was again repeated which showed no new evidence of any ST depression. Patient was started on IV heparin drip for dynamic changes in the EKG. First set of troponin was borderline. No history of any headache dizziness blurry vision. No history of syncope or seizure. Hospitalist service was consulted for further care of the patient.  PAST MEDICAL HISTORY:   Past Medical History:  Diagnosis Date  . Asthma   . COPD (chronic obstructive pulmonary disease) ([place])   . Diabetes mellitus without complication ([employer])   . Diastolic heart failure ([place])   . ESRD (end stage renal disease) on dialysis ([place])   . Hypertension   . Neuropathy ([place])   . Polycystic kidney disease   . Renal insufficiency     PAST SURGICAL HISTORY: Past Surgical History:  Procedure Laterality Date  . ARTERIOVENOUS GRAFT PLACEMENT Right    x3 (R forearm currently used for access)  . COLON SURGERY    . VEIN HARVEST      SOCIAL HISTORY:  Social History  Substance Use Topics  . Smoking status:  Former Smoker    Packs/day: 0.25    Quit date: 08/11/2015  . Smokeless tobacco: Never Used  . Alcohol use No    FAMILY HISTORY:  Family History  Problem Relation Age of Onset  . Hypertension Brother     DRUG ALLERGIES:  Allergies  Allergen Reactions  . Morphine And Related Nausea And Vomiting  . Vicodin [Hydrocodone-Acetaminophen] Nausea And Vomiting  . Buprenorphine Hcl Nausea And Vomiting  . Codeine Nausea And Vomiting    REVIEW OF SYSTEMS:   CONSTITUTIONAL: No fever, fatigue or weakness.  EYES: No blurred or double vision.  EARS, NOSE, AND THROAT: No tinnitus or ear pain.  RESPIRATORY: No cough, has shortness of breath,no wheezing or hemoptysis.  CARDIOVASCULAR: has chest pain, orthopnea, edema.  GASTROINTESTINAL: No nausea, vomiting, diarrhea or abdominal pain.  GENITOURINARY: No dysuria, hematuria.  ENDOCRINE: No polyuria, nocturia,  HEMATOLOGY: No anemia, easy bruising or bleeding SKIN: No rash or lesion. MUSCULOSKELETAL: No joint pain or arthritis.   NEUROLOGIC: No tingling, numbness, weakness.  PSYCHIATRY: No anxiety or depression.   MEDICATIONS AT HOME:  Prior to Admission medications   Medication Sig Start Date End Date Taking? Authorizing Provider  albuterol (PROVENTIL HFA;VENTOLIN HFA) 108 (90 Base) MCG/ACT inhaler Inhale 2 puffs into the lungs every 6 (six) hours as needed for wheezing or shortness of breath. 03/05/16 06/03/16  Alisa Graff, FNP  amLODipine (NORVASC)  10 MG tablet Take 1 tablet (10 mg total) by mouth at bedtime. 01/17/16   [place] Lighter, MD  aspirin 81 MG chewable tablet Chew 1 tablet (81 mg total) by mouth daily. 01/17/16   [place] Lighter, MD  b complex-vitamin c-folic acid (NEPHRO-VITE) 0.8 MG TABS tablet Take 1 tablet by mouth at bedtime.    Historical Provider, MD  calcium acetate (PHOSLO) 667 MG capsule Take 667 mg by mouth 3 (three) times daily with meals.     Historical Provider, MD  cinacalcet (SENSIPAR) 30 MG tablet Take 30 mg by  mouth daily.    Historical Provider, MD  cloNIDine (CATAPRES) 0.1 MG tablet Take 1 tablet (0.1 mg total) by mouth 2 (two) times daily. 01/17/16   [place] Lighter, MD  furosemide (LASIX) 80 MG tablet Take 1 tablet (80 mg total) by mouth daily. 03/05/16 06/03/16  Alisa Graff, FNP  gabapentin (NEURONTIN) 300 MG capsule Take 300 mg by mouth 2 (two) times daily.     Historical Provider, MD  irbesartan (AVAPRO) 150 MG tablet Take 1 tablet (150 mg total) by mouth at bedtime. 01/17/16   [place] Lighter, MD  metoprolol succinate (TOPROL-XL) 50 MG 24 hr tablet Take 50 mg by mouth daily. Take with or immediately following a meal.    Historical Provider, MD  omeprazole (PRILOSEC) 20 MG capsule Take 40 mg by mouth daily.     Historical Provider, MD  oxyCODONE-acetaminophen (PERCOCET) 7.5-325 MG tablet Take 1 tablet by mouth every 8 (eight) hours as needed for moderate pain. Patient taking differently: Take 1 tablet by mouth daily as needed for moderate pain.  01/17/16   [place] Lighter, MD  rosuvastatin (CRESTOR) 5 MG tablet Take 5 mg by mouth daily.    Historical Provider, MD      PHYSICAL EXAMINATION:   VITAL SIGNS: Blood pressure (!) 178/113, pulse 72, temperature 98.2 F (36.8 C), temperature source Oral, resp. rate 14, height 5\' 3"  (1.6 m), weight 82.1 kg (181 lb), SpO2 99 %.  GENERAL:  55 y.o.-year-old patient lying in the bed with no acute distress.  EYES: Pupils equal, round, reactive to light and accommodation. No scleral icterus. Extraocular muscles intact.  HEENT: Head atraumatic, normocephalic. Oropharynx and nasopharynx clear.  NECK:  Supple, no jugular venous distention. No thyroid enlargement, no tenderness.  LUNGS: Decreased breath sounds bilaterally, basal crepitations heard. No use of accessory muscles of respiration.  CARDIOVASCULAR: S1, S2 normal. No murmurs, rubs, or gallops.  ABDOMEN: Soft, nontender, nondistended. Bowel sounds present. No organomegaly or mass.  EXTREMITIES: Has  pedal edema,no cyanosis, or clubbing.  NEUROLOGIC: Cranial nerves II through XII are intact. Muscle strength 5/5 in all extremities. Sensation intact. Gait not checked.  PSYCHIATRIC: The patient is alert and oriented x 3.  SKIN: No obvious rash, lesion, or ulcer.   LABORATORY PANEL:   CBC  Recent Labs Lab 03/17/16 0206  WBC 7.0  HGB 10.4*  HCT 31.5*  PLT 231  MCV 88.2  MCH 29.1  MCHC 33.0  RDW 20.1*   ------------------------------------------------------------------------------------------------------------------  Chemistries   Recent Labs Lab 03/17/16 0206  NA 144  K 3.8  CL 102  CO2 33*  GLUCOSE 98  BUN 29*  CREATININE 6.81*  CALCIUM 8.5*  AST 29  ALT 28  ALKPHOS 57  BILITOT 0.5   ------------------------------------------------------------------------------------------------------------------ estimated creatinine clearance is 9.3 mL/min (by C-G formula based on SCr of 6.81 mg/dL). ------------------------------------------------------------------------------------------------------------------ No results for input(s): TSH, T4TOTAL, T3FREE, THYROIDAB in the last 72 hours.  Invalid  input(s): FREET3   Coagulation profile  Recent Labs Lab 03/17/16 0207  INR 1.08   ------------------------------------------------------------------------------------------------------------------- No results for input(s): DDIMER in the last 72 hours. -------------------------------------------------------------------------------------------------------------------  Cardiac Enzymes  Recent Labs Lab 03/17/16 0206  TROPONINI 0.04*   ------------------------------------------------------------------------------------------------------------------ Invalid input(s): POCBNP  ---------------------------------------------------------------------------------------------------------------  Urinalysis    Component Value Date/Time   COLORURINE STRAW (A) 01/10/2016 1308    APPEARANCEUR CLEAR (A) 01/10/2016 1308   LABSPEC 1.008 01/10/2016 1308   PHURINE 8.0 01/10/2016 1308   GLUCOSEU 150 (A) 01/10/2016 1308   HGBUR 1+ (A) 01/10/2016 1308   BILIRUBINUR NEGATIVE 01/10/2016 1308   KETONESUR NEGATIVE 01/10/2016 1308   PROTEINUR 100 (A) 01/10/2016 1308   NITRITE NEGATIVE 01/10/2016 1308   LEUKOCYTESUR NEGATIVE 01/10/2016 1308     RADIOLOGY: Dg Chest 2 View  Result Date: 03/17/2016 CLINICAL DATA:  Chest pain and difficulty breathing. Progressive shortness of breath for 3 days. Midsternal chest pain. EXAM: CHEST  2 VIEW COMPARISON:  Most recent chest radiograph 01/15/2016, chest CT 01/10/2016 FINDINGS: Stable cardiomegaly. Development of pulmonary edema since prior exam. There is fissural thickening and small left pleural effusion. Bibasilar atelectasis, no confluent airspace disease. Surgical clips in the left axilla and subclavian region. IMPRESSION: Findings consistent with CHF, mild-to-moderate in degree. Electronically Signed   By: Jeb Levering M.D.   On: 03/17/2016 01:52    EKG: Orders placed or performed during the hospital encounter of 03/17/16  . EKG 12-Lead  . EKG 12-Lead  . ED EKG within 10 minutes  . ED EKG within 10 minutes  . ED EKG  . ED EKG  . EKG 12-Lead  . EKG 12-Lead    IMPRESSION AND PLAN: 55 year old female patient with history of end-stage renal disease on dialysis, diastolic heart failure, COPD, hypertension, diabetes and this type II presented to the emergency room with chest pain and shortness of breath. Admitting diagnosis 1. Unstable angina 2. End-stage renal disease on dialysis 3. Hypertension 4. Decompensated heart failure 5. Type 2 diabetes mellitus Treatment plan Admit patient to telemetry observation bed Cycle troponin to rule out ischemia Check echocardiogram and cardiac consultation IV heparin drip for anticoagulation Aspirin 81 mg daily IV Lasix for diuresis Monitor renal function.  All the records are  reviewed and case discussed with ED provider. Management plans discussed with the patient, family and they are in agreement.  CODE STATUS:FULL Code Status History    Date Active Date Inactive Code Status Order ID Comments User Context   01/10/2016  9:11 PM 01/17/2016  9:36 PM Full Code [id number]  Mikael Spray, NP ED   12/21/2015  6:08 AM 12/22/2015  4:09 PM Full Code [id number]  Saundra Shelling, MD Inpatient   02/24/2015  3:52 AM 02/24/2015  4:15 PM Full Code [id number]  Harrie Foreman, MD Inpatient    Advance Directive Documentation   Flowsheet Row Most Recent Value  Type of Advance Directive  Healthcare Power of Attorney, Living will  Pre-existing out of facility DNR order (yellow form or pink MOST form)  No data  "MOST" Form in Place?  No data       TOTAL TIME TAKING CARE OF THIS PATIENT: 51 minutes.    Saundra Shelling M.D on 03/17/2016 at 4:59 AM  Between 7am to 6pm - Pager - [phone]  After 6pm go to [email or web address] - password [email or web address] [facility]  Office  [phone]  CC: Primary care physician; Elyse Jarvis, MD

## 2016-03-17 NOTE — ED Notes (Addendum)
Attempted to call pharmacy about heparin order - still no answer

## 2016-03-17 NOTE — Progress Notes (Signed)
Pt out in recovery post heart cath per Dr Humphrey Rolls, nl coronaries, denies complaints, no bleeding nor hematoma at right groin, Dr Humphrey Rolls has spoken to patient, may be discharged in am if no further problems, right groin no bleeding.report given to [facility] RN on telemetry.

## 2016-03-18 ENCOUNTER — Observation Stay: Admit: 2016-03-18 | Payer: Medicare Other

## 2016-03-18 DIAGNOSIS — I200 Unstable angina: Secondary | ICD-10-CM | POA: Diagnosis not present

## 2016-03-18 LAB — CBC
HCT: 30.3 % — ABNORMAL LOW (ref 35.0–47.0)
HEMOGLOBIN: 10.1 g/dL — AB (ref 12.0–16.0)
MCH: 29.8 pg (ref 26.0–34.0)
MCHC: 33.3 g/dL (ref 32.0–36.0)
MCV: 89.3 fL (ref 80.0–100.0)
PLATELETS: 219 10*3/uL (ref 150–440)
RBC: 3.39 MIL/uL — AB (ref 3.80–5.20)
RDW: 20.3 % — ABNORMAL HIGH (ref 11.5–14.5)
WBC: 8.8 10*3/uL (ref 3.6–11.0)

## 2016-03-18 LAB — BASIC METABOLIC PANEL
ANION GAP: 13 (ref 5–15)
BUN: 46 mg/dL — AB (ref 6–20)
CHLORIDE: 99 mmol/L — AB (ref 101–111)
CO2: 29 mmol/L (ref 22–32)
Calcium: 8.6 mg/dL — ABNORMAL LOW (ref 8.9–10.3)
Creatinine, Ser: 9.56 mg/dL — ABNORMAL HIGH (ref 0.44–1.00)
GFR calc Af Amer: 5 mL/min — ABNORMAL LOW (ref >60)
GFR calc non Af Amer: 4 mL/min — ABNORMAL LOW (ref >60)
Glucose, Bld: 83 mg/dL (ref 65–99)
POTASSIUM: 4.6 mmol/L (ref 3.5–5.1)
SODIUM: 141 mmol/L (ref 135–145)

## 2016-03-18 MED ORDER — ACETAMINOPHEN 325 MG PO TABS: 650.0000 mg | ORAL_TABLET | Freq: Four times a day (QID) | ORAL | Status: AC | PRN

## 2016-03-18 NOTE — Progress Notes (Signed)
  SUBJECTIVE: Patient is feeling well this morning without chest pain or shortness of breath. He had bleeding of the right groin site overnight which was controlled by the nursing staff.   Vitals:   03/18/16 0206 03/18/16 0410 03/18/16 0615 03/18/16 0906  BP: (!) 153/89 (!) 146/81  (!) 145/81  Pulse: 72 77  82  Resp: 20 20  19   Temp:  98.1 F (36.7 C)    TempSrc:  Oral    SpO2: 100% 100% 95% 94%  Weight:      Height:        Intake/Output Summary (Last 24 hours) at 03/18/16 0935 Last data filed at 03/18/16 0542  Gross per 24 hour  Intake              600 ml  Output              625 ml  Net              -25 ml    LABS: Basic Metabolic Panel:  Recent Labs  03/17/16 0801 03/18/16 0517  NA 143 141  K 4.0 4.6  CL 101 99*  CO2 32 29  GLUCOSE 98 83  BUN 32* 46*  CREATININE 7.58* 9.56*  CALCIUM 8.7* 8.6*   Liver Function Tests:  Recent Labs  03/17/16 0206  AST 29  ALT 28  ALKPHOS 57  BILITOT 0.5  PROT 7.2  ALBUMIN 3.5   No results for input(s): LIPASE, AMYLASE in the last 72 hours. CBC:  Recent Labs  03/17/16 0801 03/18/16 0517  WBC 8.5 8.8  HGB 10.6* 10.1*  HCT 32.4* 30.3*  MCV 88.4 89.3  PLT 245 219   Cardiac Enzymes:  Recent Labs  03/17/16 0801 03/17/16 1752 03/17/16 1959  TROPONINI 0.04* 0.03* 0.04*   BNP: Invalid input(s): POCBNP D-Dimer: No results for input(s): DDIMER in the last 72 hours. Hemoglobin A1C: No results for input(s): HGBA1C in the last 72 hours. Fasting Lipid Panel:  Recent Labs  03/17/16 0801  CHOL 151  HDL 57  LDLCALC 85  TRIG 46  CHOLHDL 2.6   Thyroid Function Tests: No results for input(s): TSH, T4TOTAL, T3FREE, THYROIDAB in the last 72 hours.  Invalid input(s): FREET3 Anemia Panel: No results for input(s): VITAMINB12, FOLATE, FERRITIN, TIBC, IRON, RETICCTPCT in the last 72 hours.   PHYSICAL EXAM General: Well developed, well nourished, in no acute distress HEENT:  Normocephalic and atramatic Neck:   No JVD.  Lungs: Clear bilaterally to auscultation and percussion. Heart: HRRR . Normal S1 and S2 without gallops or murmurs.  Abdomen: Bowel sounds are positive, abdomen soft and non-tender  Msk:  Back normal, normal gait. Normal strength and tone for age. Extremities: No clubbing, cyanosis or edema.   Neuro: Alert and oriented X 3. Psych:  Good affect, responds appropriately  TELEMETRY: Normal sinus rhythm 83 bpm  ASSESSMENT AND PLAN: Patient had chest pain and had cardiac catheterization done which revealed normal coronaries. Chest pain is most likely acid reflux. Right groin had some bleeding last night but is stable today without bleeding or bruit and is soft. Patient can go home with follow-up in the office. if echo is not completed by discharge, can be done in our office.  Principal Problem:   Unstable angina ([place])    Daune Perch, NP 03/18/2016 9:35 AM

## 2016-03-18 NOTE — Progress Notes (Signed)
Dialysis started 

## 2016-03-18 NOTE — Progress Notes (Signed)
Patient is being discharge home today, discharge instruction provided, iv removed, tele removed.

## 2016-03-18 NOTE — Progress Notes (Signed)
Pre Dialysis 

## 2016-03-18 NOTE — Discharge Summary (Signed)
[facility] [place] NAME: Jasmine Holloway    MR#:  [id number]  DATE OF BIRTH:  01/17/1961  DATE OF ADMISSION:  03/17/2016 ADMITTING PHYSICIAN: Saundra Shelling, MD  DATE OF DISCHARGE:  03/18/2016 PRIMARY CARE PHYSICIAN: Elyse Jarvis, MD    ADMISSION DIAGNOSIS:  NSTEMI (non-ST elevated myocardial infarction) ([place]) [I21.4] Ischemic chest pain ([place]) [I20.9] Acute congestive heart failure, unspecified congestive heart failure type ([place]) [I50.9]  DISCHARGE DIAGNOSIS:  Unstable angina  SECONDARY DIAGNOSIS:   Past Medical History:  Diagnosis Date  . Asthma   . COPD (chronic obstructive pulmonary disease) ([place])   . Diabetes mellitus without complication ([place])   . Diastolic heart failure ([place])   . ESRD (end stage renal disease) on dialysis ([place])   . Hypertension   . Neuropathy ([place])   . Polycystic kidney disease   . Renal insufficiency     HOSPITAL COURSE:   55 year old female patient with history of end-stage renal disease on dialysis, diastolic heart failure, COPD, hypertension, diabetes and this type II presented to the emergency room with chest pain and shortness of breath.  1.Unstable angina: getting cath today. Cardio following -last echo done 6/3 -LVEF - NML - troponins neg -Cardiac cath - nml -Okay to discharge patient from cardiology standpoint. Outpatient follow-up with Dr. Humphrey Rolls  2.End-stage renal disease on dialysis: Nephro c/s for HD need Had hemodialysis today  3.Hypertension - BP under control  4.Chronic diastolic heart failure Well compensated at this time  5.Type 2 diabetes mellitus - sugars well controlled  DISCHARGE CONDITIONS:   fair  CONSULTS OBTAINED:  Treatment Team:  Wende Bushy, MD Dionisio David, MD Murlean Iba, MD   PROCEDURES Cardiac catheterization normal  DRUG ALLERGIES:   Allergies  Allergen Reactions  . Morphine And Related Nausea And Vomiting  . Vicodin  [Hydrocodone-Acetaminophen] Nausea And Vomiting  . Buprenorphine Hcl Nausea And Vomiting  . Codeine Nausea And Vomiting    DISCHARGE MEDICATIONS:   Current Discharge Medication List    START taking these medications   Details  acetaminophen (TYLENOL) 325 MG tablet Take 2 tablets (650 mg total) by mouth every 6 (six) hours as needed for mild pain or headache.      CONTINUE these medications which have NOT CHANGED   Details  albuterol (PROVENTIL HFA;VENTOLIN HFA) 108 (90 Base) MCG/ACT inhaler Inhale 2 puffs into the lungs every 6 (six) hours as needed for wheezing or shortness of breath. Qty: 3 Inhaler, Refills: 3    amLODipine (NORVASC) 10 MG tablet Take 1 tablet (10 mg total) by mouth at bedtime. Qty: 30 tablet, Refills: 2    aspirin 81 MG chewable tablet Chew 1 tablet (81 mg total) by mouth daily. Qty: 30 tablet, Refills: 2    b complex-vitamin c-folic acid (NEPHRO-VITE) 0.8 MG TABS tablet Take 1 tablet by mouth at bedtime.    calcium acetate (PHOSLO) 667 MG capsule Take 667 mg by mouth 3 (three) times daily with meals.     cinacalcet (SENSIPAR) 30 MG tablet Take 30 mg by mouth daily.    cloNIDine (CATAPRES) 0.1 MG tablet Take 1 tablet (0.1 mg total) by mouth 2 (two) times daily. Qty: 60 tablet, Refills: 2    furosemide (LASIX) 80 MG tablet Take 1 tablet (80 mg total) by mouth daily. Qty: 90 tablet, Refills: 3    gabapentin (NEURONTIN) 300 MG capsule Take 300 mg by mouth 2 (two) times daily.     irbesartan (AVAPRO) 150 MG  tablet Take 1 tablet (150 mg total) by mouth at bedtime. Qty: 30 tablet, Refills: 2    metoprolol succinate (TOPROL-XL) 50 MG 24 hr tablet Take 50 mg by mouth daily. Take with or immediately following a meal.    omeprazole (PRILOSEC) 20 MG capsule Take 40 mg by mouth daily.     oxyCODONE-acetaminophen (PERCOCET) 7.5-325 MG tablet Take 1 tablet by mouth every 8 (eight) hours as needed for moderate pain. Qty: 20 tablet, Refills: 0    rosuvastatin  (CRESTOR) 5 MG tablet Take 5 mg by mouth daily.         DISCHARGE INSTRUCTIONS:   Activity as tolerated Renal and diabetic diet Follow-up with primary care physician in 5 days Follow-up with primary cardiologist Dr. Jason Nest in a week Follow-up with nephrology in 3-5 days and continue hemodialysis as scheduled  DIET:  Renal diet  DISCHARGE CONDITION:  Fair  ACTIVITY:  Activity as tolerated  OXYGEN:  Home Oxygen: No.   Oxygen Delivery: room air  DISCHARGE LOCATION:  home   If you experience worsening of your admission symptoms, develop shortness of breath, life threatening emergency, suicidal or homicidal thoughts you must seek medical attention immediately by calling 911 or calling your MD immediately  if symptoms less severe.  You Must read complete instructions/literature along with all the possible adverse reactions/side effects for all the Medicines you take and that have been prescribed to you. Take any new Medicines after you have completely understood and accpet all the possible adverse reactions/side effects.   Please note  You were cared for by a hospitalist during your hospital stay. If you have any questions about your discharge medications or the care you received while you were in the hospital after you are discharged, you can call the unit and asked to speak with the hospitalist on call if the hospitalist that took care of you is not available. Once you are discharged, your primary care physician will handle any further medical issues. Please note that NO REFILLS for any discharge medications will be authorized once you are discharged, as it is imperative that you return to your primary care physician (or establish a relationship with a primary care physician if you do not have one) for your aftercare needs so that they can reassess your need for medications and monitor your lab values.     Today  Chief Complaint  Patient presents with  . Chest Pain  .  Respiratory Distress   Patient is doing fine. Denies any chest pain. Had hemodialysis today. Cardiac catheterization site in the groin is healing well  ROS:  CONSTITUTIONAL: Denies fevers, chills. Denies any fatigue, weakness.  EYES: Denies blurry vision, double vision, eye pain. EARS, NOSE, THROAT: Denies tinnitus, ear pain, hearing loss. RESPIRATORY: Denies cough, wheeze, shortness of breath.  CARDIOVASCULAR: Denies chest pain, palpitations, edema.  GASTROINTESTINAL: Denies nausea, vomiting, diarrhea, abdominal pain. Denies bright red blood per rectum. GENITOURINARY: Denies dysuria, hematuria. ENDOCRINE: Denies nocturia or thyroid problems. HEMATOLOGIC AND LYMPHATIC: Denies easy bruising or bleeding. SKIN: Denies rash or lesion. MUSCULOSKELETAL: Denies pain in neck, back, shoulder, knees, hips or arthritic symptoms. Cardiac catheterization site in the right groin with no bleeding NEUROLOGIC: Denies paralysis, paresthesias.  PSYCHIATRIC: Denies anxiety or depressive symptoms.   VITAL SIGNS:  Blood pressure (!) 156/86, pulse 78, temperature 97.7 F (36.5 C), temperature source Oral, resp. rate 18, height 5\' 3"  (1.6 m), weight 83.4 kg (183 lb 13.8 oz), SpO2 99 %.  I/O:    Intake/Output  Summary (Last 24 hours) at 03/18/16 1501 Last data filed at 03/18/16 1350  Gross per 24 hour  Intake              480 ml  Output             1625 ml  Net            -1145 ml    PHYSICAL EXAMINATION:  GENERAL:  55 y.o.-year-old patient lying in the bed with no acute distress.  EYES: Pupils equal, round, reactive to light and accommodation. No scleral icterus. Extraocular muscles intact.  HEENT: Head atraumatic, normocephalic. Oropharynx and nasopharynx clear.  NECK:  Supple, no jugular venous distention. No thyroid enlargement, no tenderness.  LUNGS: Normal breath sounds bilaterally, no wheezing, rales,rhonchi or crepitation. No use of accessory muscles of respiration.  CARDIOVASCULAR: S1, S2  normal. No murmurs, rubs, or gallops.  ABDOMEN: Soft, non-tender, non-distended. Bowel sounds present. No organomegaly or mass.  EXTREMITIES: No pedal edema, cyanosis, or clubbing.  Cardiac catheterization site in the right groin with no bleeding NEUROLOGIC: Cranial nerves II through XII are intact. Muscle strength 5/5 in all extremities. Sensation intact. Gait not checked.  PSYCHIATRIC: The patient is alert and oriented x 3.  SKIN: No obvious rash, lesion, or ulcer.   DATA REVIEW:   CBC  Recent Labs Lab 03/18/16 0517  WBC 8.8  HGB 10.1*  HCT 30.3*  PLT 219    Chemistries   Recent Labs Lab 03/17/16 0206  03/18/16 0517  NA 144  < > 141  K 3.8  < > 4.6  CL 102  < > 99*  CO2 33*  < > 29  GLUCOSE 98  < > 83  BUN 29*  < > 46*  CREATININE 6.81*  < > 9.56*  CALCIUM 8.5*  < > 8.6*  AST 29  --   --   ALT 28  --   --   ALKPHOS 57  --   --   BILITOT 0.5  --   --   < > = values in this interval not displayed.  Cardiac Enzymes  Recent Labs Lab 03/17/16 1959  TROPONINI 0.04*    Microbiology Results  Results for orders placed or performed during the hospital encounter of 03/17/16  MRSA PCR Screening     Status: None   Collection Time: 03/17/16  6:45 AM  Result Value Ref Range Status   MRSA by PCR NEGATIVE NEGATIVE Final    Comment:        The GeneXpert MRSA Assay (FDA approved for NASAL specimens only), is one component of a comprehensive MRSA colonization surveillance program. It is not intended to diagnose MRSA infection nor to guide or monitor treatment for MRSA infections.     RADIOLOGY:  Dg Chest 2 View  Result Date: 03/17/2016 CLINICAL DATA:  Chest pain and difficulty breathing. Progressive shortness of breath for 3 days. Midsternal chest pain. EXAM: CHEST  2 VIEW COMPARISON:  Most recent chest radiograph 01/15/2016, chest CT 01/10/2016 FINDINGS: Stable cardiomegaly. Development of pulmonary edema since prior exam. There is fissural thickening and small  left pleural effusion. Bibasilar atelectasis, no confluent airspace disease. Surgical clips in the left axilla and subclavian region. IMPRESSION: Findings consistent with CHF, mild-to-moderate in degree. Electronically Signed   By: Jeb Levering M.D.   On: 03/17/2016 01:52    EKG:   Orders placed or performed during the hospital encounter of 03/17/16  . EKG 12-Lead  . EKG 12-Lead  .  ED EKG within 10 minutes  . ED EKG within 10 minutes  . ED EKG  . ED EKG  . EKG 12-Lead  . EKG 12-Lead      Management plans discussed with the patient, family and they are in agreement.  CODE STATUS:     Code Status Orders        Start     Ordered   03/17/16 0607  Full code  Continuous     03/17/16 0606    Code Status History    Date Active Date Inactive Code Status Order ID Comments User Context   03/17/2016  6:06 AM 03/18/2016  7:51 AM Full Code [id number]  Saundra Shelling, MD ED   01/10/2016  9:11 PM 01/17/2016  9:36 PM Full Code [id number]  Mikael Spray, NP ED   12/21/2015  6:08 AM 12/22/2015  4:09 PM Full Code [id number]  Saundra Shelling, MD Inpatient   02/24/2015  3:52 AM 02/24/2015  4:15 PM Full Code [id number]  Harrie Foreman, MD Inpatient    Advance Directive Documentation   Flowsheet Row Most Recent Value  Type of Advance Directive  Healthcare Power of Attorney, Living will  Pre-existing out of facility DNR order (yellow form or pink MOST form)  No data  "MOST" Form in Place?  No data      TOTAL TIME TAKING CARE OF THIS PATIENT: 45  minutes.   Note: This dictation was prepared with Dragon dictation along with smaller phrase technology. Any transcriptional errors that result from this process are unintentional.   @M[facility] @  on 03/18/2016 at 3:01 PM  Between 7am to 6pm - Pager - [phone]  After 6pm go to [email or web address] - password [email or web address] [facility]  Office  [phone]  CC: Primary care physician; Elyse Jarvis, MD

## 2016-03-18 NOTE — Progress Notes (Signed)
Subjective:  Patient admitted for midsternal chest pain and pressure. Underwent cardiac catheterization by Dr. Humphrey Rolls For notes, coronaries and normal.  Chest pain likely GI related.   Patient seen during dialysis Tolerating well   HEMODIALYSIS FLOWSHEET:  Blood Flow Rate (mL/min): 400 mL/min Arterial Pressure (mmHg): -200 mmHg Venous Pressure (mmHg): 240 mmHg Transmembrane Pressure (mmHg): 70 mmHg Ultrafiltration Rate (mL/min): 430 mL/min Dialysate Flow Rate (mL/min): 800 ml/min Conductivity: Machine : 14 Conductivity: Machine : 14 Dialysis Fluid Bolus: Normal Saline Bolus Amount (mL): 250 mL Dialysate Change: 2K Intra-Hemodialysis Comments: 1500. Tx complete    Objective:  Vital signs in last 24 hours:  Temp:  [97.7 F (36.5 C)-98.2 F (36.8 C)] 98.2 F (36.8 C) (08/09 1000) Pulse Rate:  [65-82] 74 (08/09 1340) Resp:  [14-22] 16 (08/09 1340) BP: (121-188)/(72-101) 156/100 (08/09 1340) SpO2:  [88 %-100 %] 98 % (08/09 1340) Weight:  [84.4 kg (186 lb 1.1 oz)] 84.4 kg (186 lb 1.1 oz) (08/09 1000)  Weight change: 0.907 kg (2 lb) Filed Weights   03/17/16 0109 03/17/16 1105 03/18/16 1000  Weight: 82.1 kg (181 lb) 83 kg (183 lb) 84.4 kg (186 lb 1.1 oz)    Intake/Output:    Intake/Output Summary (Last 24 hours) at 03/18/16 1351 Last data filed at 03/18/16 0955  Gross per 24 hour  Intake              480 ml  Output              625 ml  Net             -145 ml     Physical Exam: General: No acute distress, laying in the bed  HEENT Anicteric, moist oral mucous membranes  Neck supple  Pulm/lungs Lungs are clear to auscultation  CVS/Heart Regular, no rub or gallop  Abdomen:  Soft, nontender  Extremities: no edema  Neurologic: Alert oriented  Skin: no rashes  Access: AV fistula       Basic Metabolic Panel:   Recent Labs Lab 03/17/16 0206 03/17/16 0801 03/18/16 0517  NA 144 143 141  K 3.8 4.0 4.6  CL 102 101 99*  CO2 33* 32 29  GLUCOSE 98 98 83  BUN  29* 32* 46*  CREATININE 6.81* 7.58* 9.56*  CALCIUM 8.5* 8.7* 8.6*     CBC:  Recent Labs Lab 03/17/16 0206 03/17/16 0801 03/18/16 0517  WBC 7.0 8.5 8.8  HGB 10.4* 10.6* 10.1*  HCT 31.5* 32.4* 30.3*  MCV 88.2 88.4 89.3  PLT 231 245 219      Microbiology:  Recent Results (from the past 720 hour(s))  MRSA PCR Screening     Status: None   Collection Time: 03/17/16  6:45 AM  Result Value Ref Range Status   MRSA by PCR NEGATIVE NEGATIVE Final    Comment:        The GeneXpert MRSA Assay (FDA approved for NASAL specimens only), is one component of a comprehensive MRSA colonization surveillance program. It is not intended to diagnose MRSA infection nor to guide or monitor treatment for MRSA infections.     Coagulation Studies:  Recent Labs  03/17/16 0207  LABPROT 14.0  INR 1.08    Urinalysis: No results for input(s): COLORURINE, LABSPEC, PHURINE, GLUCOSEU, HGBUR, BILIRUBINUR, KETONESUR, PROTEINUR, UROBILINOGEN, NITRITE, LEUKOCYTESUR in the last 72 hours.  Invalid input(s): APPERANCEUR    Imaging: Dg Chest 2 View  Result Date: 03/17/2016 CLINICAL DATA:  Chest pain and difficulty breathing. Progressive shortness of  breath for 3 days. Midsternal chest pain. EXAM: CHEST  2 VIEW COMPARISON:  Most recent chest radiograph 01/15/2016, chest CT 01/10/2016 FINDINGS: Stable cardiomegaly. Development of pulmonary edema since prior exam. There is fissural thickening and small left pleural effusion. Bibasilar atelectasis, no confluent airspace disease. Surgical clips in the left axilla and subclavian region. IMPRESSION: Findings consistent with CHF, mild-to-moderate in degree. Electronically Signed   By: Jeb Levering M.D.   On: 03/17/2016 01:52     Medications:     . amLODipine  10 mg Oral QHS  . aspirin  81 mg Oral Daily  . calcium acetate  667 mg Oral TID WC  . cinacalcet  30 mg Oral Daily  . cloNIDine  0.1 mg Oral BID  . gabapentin  300 mg Oral BID  . irbesartan   150 mg Oral QHS  . metoprolol succinate  50 mg Oral Daily  . multivitamin  1 tablet Oral QHS  . pantoprazole  40 mg Oral Daily  . rosuvastatin  5 mg Oral Daily  . sodium chloride flush  3 mL Intravenous Q12H  . sodium chloride flush  3 mL Intravenous Q12H   sodium chloride, sodium chloride, acetaminophen, albuterol, nitroGLYCERIN, ondansetron (ZOFRAN) IV, oxyCODONE-acetaminophen, sodium chloride flush, sodium chloride flush  Assessment/ Plan:  55 y.o. female  with ESRD secondary to polycystic kidney hypertension, GERD, peritoneal dialysis h/o peritonitis, arthritis, chronic left hand pain, ruptured access   MWF 3rd shift, [place]   1. End Stage Renal Disease:   Patient seen during dialysis Tolerating well   2. Anemia of chronic kidney disease Hemoglobin 10.1, acceptable  3.  Secondary hyperparathyroidism Will monitor phosphorus this admission   LOS: 0 Luqman Perrelli 8/9/20171:51 PM

## 2016-03-18 NOTE — Progress Notes (Signed)
Dialysis complete

## 2016-03-18 NOTE — Discharge Instructions (Signed)
Activity as tolerated Renal and diabetic diet Follow-up with primary care physician in 5 days Follow-up with primary cardiologist Dr. Jason Nest in a week Follow-up with nephrology in 3-5 days and continue hemodialysis as scheduled

## 2016-03-18 NOTE — Progress Notes (Signed)
Patient off the unit to dialysis at this time.

## 2016-03-18 NOTE — Progress Notes (Signed)
Pt pulled mynx dressing off of cath site in her sleep, pt now bleeding at right groin site. Held pressure & called ICU charge nurse Laurin Coder over to look at pt, ICU charge RN helped apply PAD onto site to help stop bleeding. Vital signs stable, distal pulses 3+ no hematoma at site. Pt just complains of soreness at site with palpation and movement, tylenol was given. Will continue to monitor. Conley Simmonds, RN

## 2016-03-18 NOTE — Progress Notes (Signed)
Pt noted to be bleeding from the PAD, pt had leg bent and did not keep it flat. Vital signs stable, distal pulses 3+, no hematoma noted, pt still sore when palpating & applying pressure to site. Surgicel applied to site to try to make it stop bleeding. So far it looks like this has stopped the bleeding, 2x2 guaze with a tegaderm applied to site. Will continue to monitor. Conley Simmonds, RN

## 2016-03-18 NOTE — Progress Notes (Signed)
Patient returned to unit from dialysis, 1 liter removed as per dialysis nurse report, tylenol given for pain right groin, will continue to monitor

## 2016-03-18 NOTE — Progress Notes (Signed)
Post dialysis 

## 2016-03-18 NOTE — Progress Notes (Signed)
I was called to patient who was requesting breathing treatment while in the middle of an EKG on another patient. RN reported handling other task and couldn't see patient. I reported to patient room as soon as I could as she was yelling about difficulty breathing and seemed very agitated. I tried calming her down and instructed her to breath with handheld neb(only closest delivery source) slowly and deeply. Patient refused using treatment despite explanation of situation. I then went to get a mask from the venturi setup and at this point the treatment was semi done. Patient continue to express dissatisfaction.

## 2016-03-18 NOTE — Progress Notes (Signed)
Pt's cath site is now stable & not bleeding, dressing is clean, dry & intact since the surgicel. Pt very anxious,did receive breathing treatment with mask after respiratory had a failed attempt of trying to give a breathing treatment with a handheld mask. After mask breathing treatment pt felt better, but then felt nauseous. Proceed to throw up, got pt zofran & pt threw up again. After the second vomiting episode pt felt a little better, and is now trying to rest. Will continue to monitor. Conley Simmonds, RN

## 2016-03-27 ENCOUNTER — Encounter: Payer: Self-pay | Admitting: Emergency Medicine

## 2016-03-27 ENCOUNTER — Emergency Department
Admission: EM | Admit: 2016-03-27 | Discharge: 2016-03-27 | Disposition: A | Discharge status: Home / Self Care | Payer: Medicare Other | Source: Home / Self Care

## 2016-03-27 DIAGNOSIS — R109 Unspecified abdominal pain: Secondary | ICD-10-CM | POA: Insufficient documentation

## 2016-03-27 DIAGNOSIS — N186 End stage renal disease: Secondary | ICD-10-CM | POA: Diagnosis not present

## 2016-03-27 DIAGNOSIS — Z5321 Procedure and treatment not carried out due to patient leaving prior to being seen by health care provider: Secondary | ICD-10-CM

## 2016-03-27 DIAGNOSIS — R1010 Upper abdominal pain, unspecified: Secondary | ICD-10-CM | POA: Diagnosis not present

## 2016-03-27 LAB — LIPASE, BLOOD: LIPASE: 16 U/L (ref 11–51)

## 2016-03-27 LAB — COMPREHENSIVE METABOLIC PANEL
ALBUMIN: 3.6 g/dL (ref 3.5–5.0)
ALT: 22 U/L (ref 14–54)
ANION GAP: 9 (ref 5–15)
AST: 24 U/L (ref 15–41)
Alkaline Phosphatase: 59 U/L (ref 38–126)
BUN: 15 mg/dL (ref 6–20)
CHLORIDE: 99 mmol/L — AB (ref 101–111)
CO2: 34 mmol/L — AB (ref 22–32)
Calcium: 8.7 mg/dL — ABNORMAL LOW (ref 8.9–10.3)
Creatinine, Ser: 4.67 mg/dL — ABNORMAL HIGH (ref 0.44–1.00)
GFR calc Af Amer: 11 mL/min — ABNORMAL LOW (ref >60)
GFR calc non Af Amer: 10 mL/min — ABNORMAL LOW (ref >60)
GLUCOSE: 90 mg/dL (ref 65–99)
POTASSIUM: 4.1 mmol/L (ref 3.5–5.1)
SODIUM: 142 mmol/L (ref 135–145)
Total Bilirubin: 0.4 mg/dL (ref 0.3–1.2)
Total Protein: 8.5 g/dL — ABNORMAL HIGH (ref 6.5–8.1)

## 2016-03-27 LAB — URINALYSIS COMPLETE WITH MICROSCOPIC (ARMC ONLY)
Bilirubin Urine: NEGATIVE
GLUCOSE, UA: 150 mg/dL — AB
Hgb urine dipstick: NEGATIVE
KETONES UR: NEGATIVE mg/dL
Leukocytes, UA: NEGATIVE
Nitrite: NEGATIVE
Protein, ur: >500 mg/dL — AB
SPECIFIC GRAVITY, URINE: 1.005 (ref 1.005–1.030)
pH: 9 — ABNORMAL HIGH (ref 5.0–8.0)

## 2016-03-27 LAB — CBC
HEMATOCRIT: 29.1 % — AB (ref 35.0–47.0)
HEMOGLOBIN: 9.8 g/dL — AB (ref 12.0–16.0)
MCH: 29.4 pg (ref 26.0–34.0)
MCHC: 33.9 g/dL (ref 32.0–36.0)
MCV: 86.9 fL (ref 80.0–100.0)
Platelets: 403 10*3/uL (ref 150–440)
RBC: 3.35 MIL/uL — ABNORMAL LOW (ref 3.80–5.20)
RDW: 20.5 % — ABNORMAL HIGH (ref 11.5–14.5)
WBC: 8.6 10*3/uL (ref 3.6–11.0)

## 2016-03-27 NOTE — ED Triage Notes (Addendum)
Pt presents to ED with reports of upper abdominal pain for two days. Pt denies nausea, vomiting or diarrhea. Pt states the pain is an ulcer feeling, states it feels like she has a lump. Pt states she had dialysis today. Pt reports swelling in her ankles.

## 2016-03-27 NOTE — ED Notes (Signed)
Pt. Called for a room.  No response, called for pt. In bathroom and sub-wait, no response.

## 2016-03-28 ENCOUNTER — Inpatient Hospital Stay
Admission: EM | Admit: 2016-03-28 | Discharge: 2016-03-29 | DRG: 682 | Disposition: A | Discharge status: Home / Self Care | Payer: Medicare Other | Attending: Internal Medicine | Admitting: Internal Medicine

## 2016-03-28 ENCOUNTER — Emergency Department: Payer: Medicare Other

## 2016-03-28 ENCOUNTER — Encounter: Payer: Self-pay | Admitting: Emergency Medicine

## 2016-03-28 DIAGNOSIS — E1122 Type 2 diabetes mellitus with diabetic chronic kidney disease: Secondary | ICD-10-CM | POA: Diagnosis present

## 2016-03-28 DIAGNOSIS — Z79899 Other long term (current) drug therapy: Secondary | ICD-10-CM

## 2016-03-28 DIAGNOSIS — D631 Anemia in chronic kidney disease: Secondary | ICD-10-CM | POA: Diagnosis present

## 2016-03-28 DIAGNOSIS — R1010 Upper abdominal pain, unspecified: Secondary | ICD-10-CM

## 2016-03-28 DIAGNOSIS — Z8249 Family history of ischemic heart disease and other diseases of the circulatory system: Secondary | ICD-10-CM | POA: Diagnosis not present

## 2016-03-28 DIAGNOSIS — J449 Chronic obstructive pulmonary disease, unspecified: Secondary | ICD-10-CM | POA: Diagnosis present

## 2016-03-28 DIAGNOSIS — Z7982 Long term (current) use of aspirin: Secondary | ICD-10-CM | POA: Diagnosis not present

## 2016-03-28 DIAGNOSIS — Z8711 Personal history of peptic ulcer disease: Secondary | ICD-10-CM

## 2016-03-28 DIAGNOSIS — R1013 Epigastric pain: Secondary | ICD-10-CM

## 2016-03-28 DIAGNOSIS — Z87891 Personal history of nicotine dependence: Secondary | ICD-10-CM

## 2016-03-28 DIAGNOSIS — I5033 Acute on chronic diastolic (congestive) heart failure: Secondary | ICD-10-CM | POA: Diagnosis present

## 2016-03-28 DIAGNOSIS — N186 End stage renal disease: Secondary | ICD-10-CM | POA: Diagnosis present

## 2016-03-28 DIAGNOSIS — N2581 Secondary hyperparathyroidism of renal origin: Secondary | ICD-10-CM | POA: Diagnosis present

## 2016-03-28 DIAGNOSIS — I509 Heart failure, unspecified: Secondary | ICD-10-CM

## 2016-03-28 DIAGNOSIS — K219 Gastro-esophageal reflux disease without esophagitis: Secondary | ICD-10-CM | POA: Diagnosis present

## 2016-03-28 DIAGNOSIS — I132 Hypertensive heart and chronic kidney disease with heart failure and with stage 5 chronic kidney disease, or end stage renal disease: Secondary | ICD-10-CM | POA: Diagnosis present

## 2016-03-28 DIAGNOSIS — Z885 Allergy status to narcotic agent status: Secondary | ICD-10-CM | POA: Diagnosis not present

## 2016-03-28 DIAGNOSIS — Z992 Dependence on renal dialysis: Secondary | ICD-10-CM

## 2016-03-28 DIAGNOSIS — Z886 Allergy status to analgesic agent status: Secondary | ICD-10-CM | POA: Diagnosis not present

## 2016-03-28 DIAGNOSIS — J9601 Acute respiratory failure with hypoxia: Secondary | ICD-10-CM | POA: Diagnosis present

## 2016-03-28 LAB — CBC
HCT: 30.7 % — ABNORMAL LOW (ref 35.0–47.0)
Hemoglobin: 10 g/dL — ABNORMAL LOW (ref 12.0–16.0)
MCH: 28.7 pg (ref 26.0–34.0)
MCHC: 32.7 g/dL (ref 32.0–36.0)
MCV: 87.8 fL (ref 80.0–100.0)
PLATELETS: 440 10*3/uL (ref 150–440)
RBC: 3.49 MIL/uL — ABNORMAL LOW (ref 3.80–5.20)
RDW: 20.5 % — AB (ref 11.5–14.5)
WBC: 11.2 10*3/uL — ABNORMAL HIGH (ref 3.6–11.0)

## 2016-03-28 LAB — CREATININE, SERUM
Creatinine, Ser: 7.44 mg/dL — ABNORMAL HIGH (ref 0.44–1.00)
GFR calc non Af Amer: 5 mL/min — ABNORMAL LOW (ref >60)
GFR, EST AFRICAN AMERICAN: 6 mL/min — AB (ref >60)

## 2016-03-28 LAB — BRAIN NATRIURETIC PEPTIDE: B NATRIURETIC PEPTIDE 5: 1606 pg/mL — AB (ref 0.0–100.0)

## 2016-03-28 LAB — TROPONIN I: Troponin I: 0.06 ng/mL (ref <0.03)

## 2016-03-28 MED ORDER — HEPARIN SODIUM (PORCINE) 5000 UNIT/ML IJ SOLN
5000.0000 [IU] | Freq: Three times a day (TID) | INTRAMUSCULAR | Status: DC
  Administered 2016-03-28 – 2016-03-29 (×2): 5000 [IU] via SUBCUTANEOUS
  Filled 2016-03-28 (×2): qty 1

## 2016-03-28 MED ORDER — CALCIUM ACETATE (PHOS BINDER) 667 MG PO CAPS
667.0000 mg | ORAL_CAPSULE | Freq: Three times a day (TID) | ORAL | Status: DC
  Administered 2016-03-29 (×2): 667 mg via ORAL
  Filled 2016-03-28 (×2): qty 1

## 2016-03-28 MED ORDER — ONDANSETRON HCL 4 MG/2ML IJ SOLN
INTRAMUSCULAR | Status: AC
  Administered 2016-03-28: 4 mg via INTRAVENOUS
  Filled 2016-03-28: qty 2

## 2016-03-28 MED ORDER — AMLODIPINE BESYLATE 10 MG PO TABS: 10.0000 mg | ORAL_TABLET | Freq: Every day | ORAL | Status: DC

## 2016-03-28 MED ORDER — DIATRIZOATE MEGLUMINE & SODIUM 66-10 % PO SOLN
15.0000 mL | Freq: Once | ORAL | Status: AC
  Administered 2016-03-28: 15 mL via ORAL

## 2016-03-28 MED ORDER — IRBESARTAN 150 MG PO TABS
150.0000 mg | ORAL_TABLET | Freq: Every day | ORAL | Status: DC
  Administered 2016-03-28: 150 mg via ORAL
  Filled 2016-03-28: qty 1

## 2016-03-28 MED ORDER — FENTANYL CITRATE (PF) 100 MCG/2ML IJ SOLN
50.0000 ug | Freq: Once | INTRAMUSCULAR | Status: AC
  Administered 2016-03-28: 50 ug via INTRAVENOUS

## 2016-03-28 MED ORDER — FENTANYL CITRATE (PF) 100 MCG/2ML IJ SOLN
INTRAMUSCULAR | Status: AC
  Administered 2016-03-28: 50 ug via INTRAVENOUS
  Filled 2016-03-28: qty 2

## 2016-03-28 MED ORDER — SODIUM CHLORIDE 0.9 % IV SOLN: 100.0000 mL | INTRAVENOUS | Status: DC | PRN

## 2016-03-28 MED ORDER — LIDOCAINE HCL (PF) 1 % IJ SOLN
5.0000 mL | INTRAMUSCULAR | Status: DC | PRN
  Filled 2016-03-28: qty 5

## 2016-03-28 MED ORDER — CLONIDINE HCL 0.1 MG PO TABS
0.1000 mg | ORAL_TABLET | Freq: Two times a day (BID) | ORAL | Status: DC
  Administered 2016-03-28 – 2016-03-29 (×2): 0.1 mg via ORAL
  Filled 2016-03-28 (×2): qty 1

## 2016-03-28 MED ORDER — HEPARIN SODIUM (PORCINE) 1000 UNIT/ML DIALYSIS
1000.0000 [IU] | INTRAMUSCULAR | Status: DC | PRN
  Filled 2016-03-28: qty 1

## 2016-03-28 MED ORDER — ONDANSETRON HCL 4 MG/2ML IJ SOLN
4.0000 mg | Freq: Once | INTRAMUSCULAR | Status: AC
  Administered 2016-03-28: 4 mg via INTRAVENOUS

## 2016-03-28 MED ORDER — PROMETHAZINE HCL 25 MG/ML IJ SOLN
12.5000 mg | Freq: Once | INTRAMUSCULAR | Status: AC
  Administered 2016-03-28: 12.5 mg via INTRAVENOUS

## 2016-03-28 MED ORDER — FUROSEMIDE 40 MG PO TABS
80.0000 mg | ORAL_TABLET | Freq: Every day | ORAL | Status: DC
  Administered 2016-03-29: 80 mg via ORAL
  Filled 2016-03-28: qty 2

## 2016-03-28 MED ORDER — LIDOCAINE-PRILOCAINE 2.5-2.5 % EX CREA: 1.0000 "application " | TOPICAL_CREAM | CUTANEOUS | Status: DC | PRN

## 2016-03-28 MED ORDER — METOPROLOL SUCCINATE ER 50 MG PO TB24
50.0000 mg | ORAL_TABLET | Freq: Every day | ORAL | Status: DC
  Administered 2016-03-28 – 2016-03-29 (×2): 50 mg via ORAL
  Filled 2016-03-28 (×2): qty 1

## 2016-03-28 MED ORDER — GABAPENTIN 300 MG PO CAPS
300.0000 mg | ORAL_CAPSULE | Freq: Two times a day (BID) | ORAL | Status: DC
  Administered 2016-03-28 – 2016-03-29 (×2): 300 mg via ORAL
  Filled 2016-03-28 (×2): qty 1

## 2016-03-28 MED ORDER — ASPIRIN 81 MG PO CHEW
81.0000 mg | CHEWABLE_TABLET | Freq: Every day | ORAL | Status: DC
  Administered 2016-03-29: 81 mg via ORAL
  Filled 2016-03-28 (×2): qty 1

## 2016-03-28 MED ORDER — PENTAFLUOROPROP-TETRAFLUOROETH EX AERO: 1.0000 "application " | INHALATION_SPRAY | CUTANEOUS | Status: DC | PRN

## 2016-03-28 MED ORDER — RENA-VITE PO TABS
1.0000 | ORAL_TABLET | Freq: Every day | ORAL | Status: DC
  Administered 2016-03-28: 1 via ORAL
  Filled 2016-03-28: qty 1

## 2016-03-28 MED ORDER — ROSUVASTATIN CALCIUM 10 MG PO TABS
5.0000 mg | ORAL_TABLET | Freq: Every day | ORAL | Status: DC
  Administered 2016-03-29: 5 mg via ORAL
  Filled 2016-03-28: qty 1

## 2016-03-28 MED ORDER — ACETAMINOPHEN 325 MG PO TABS: 650.0000 mg | ORAL_TABLET | Freq: Four times a day (QID) | ORAL | Status: DC | PRN

## 2016-03-28 MED ORDER — FUROSEMIDE 10 MG/ML IJ SOLN: 60.0000 mg | Freq: Once | INTRAMUSCULAR | Status: DC

## 2016-03-28 MED ORDER — OXYCODONE-ACETAMINOPHEN 7.5-325 MG PO TABS
1.0000 | ORAL_TABLET | Freq: Three times a day (TID) | ORAL | Status: DC | PRN
  Administered 2016-03-28: 1 via ORAL
  Filled 2016-03-28: qty 1

## 2016-03-28 MED ORDER — PANTOPRAZOLE SODIUM 40 MG PO TBEC
40.0000 mg | DELAYED_RELEASE_TABLET | Freq: Two times a day (BID) | ORAL | Status: DC
  Administered 2016-03-28 – 2016-03-29 (×3): 40 mg via ORAL
  Filled 2016-03-28 (×2): qty 1

## 2016-03-28 MED ORDER — ALBUTEROL SULFATE (2.5 MG/3ML) 0.083% IN NEBU: 3.0000 mL | INHALATION_SOLUTION | Freq: Four times a day (QID) | RESPIRATORY_TRACT | Status: DC | PRN

## 2016-03-28 MED ORDER — CINACALCET HCL 30 MG PO TABS
30.0000 mg | ORAL_TABLET | Freq: Every day | ORAL | Status: DC
  Filled 2016-03-28: qty 1

## 2016-03-28 NOTE — Progress Notes (Signed)
HD COMPLETED  

## 2016-03-28 NOTE — ED Notes (Signed)
Spoke with Dr. Anselm Jungling regarding patient placement to 2A without tele orders, per Dr. Anselm Jungling, he will change orders to go to a medsurg floor.

## 2016-03-28 NOTE — Progress Notes (Signed)
Hd started  

## 2016-03-28 NOTE — ED Notes (Signed)
This RN spoke with Dr. Anselm Jungling regard pt's BP being 167/111. Per Dr. Marthann Schiller pt is to be dialyzed today and does not need anything for her BP at this time, Dr. Anselm Jungling states to call him after pt has dialysis if her BP is still elevated.

## 2016-03-28 NOTE — ED Notes (Signed)
This RN spoke with Nursing Supervisor about patient placement. Per Nursing supervisor pt is to go to 2A due to hx of CHF, elevated BNP, and elevated troponin. This RN spoke with Dr. Marthann Schiller and requested tele orders. Verbally received orders for telemetry at this time.

## 2016-03-28 NOTE — ED Notes (Signed)
While RN in room patient O2 sat decreased to 82% on room air with good waveform. Patient placed on 2 Liters O2 with improvement in O2 sats to 91%. Patient c/o shortness of breath.   Dr. Kerman Passey notified of above.

## 2016-03-28 NOTE — Progress Notes (Signed)
PRE DIALYSIS ASSESSMENT 

## 2016-03-28 NOTE — ED Notes (Signed)
Pt given yellow socks and instructed not to get out of bed without assistance. Pt states understanding at this time. Will continue to monitor for further patient needs.

## 2016-03-28 NOTE — ED Notes (Signed)
NAD noted at this time. Pt c/o 10/10  Abdominal pain at this time. This RN to give 68mcg of Fentanyl for pain.

## 2016-03-28 NOTE — ED Provider Notes (Addendum)
[facility] Emergency Department Provider Note  Time seen: 8:16 AM  I have reviewed the triage vital signs and the nursing notes.   HISTORY  Chief Complaint Abdominal Pain    HPI Jasmine Holloway is a 55 y.o. female with a past medical history of asthma, COPD, diabetes, ESRD on HD Monday, Wednesday, Friday, hypertension, presents the emergency department with upper abdominal pain. Return to the patient for the past 2 days she has been experiencing upper abdominal pain. She came to the emergency department yesterday, had a workup performed a left before being seen. Patient states the pain continued overnight so she came back to the emergency department today for evaluation. States nausea but denies vomiting. Denies diarrhea. Denies fever. States his abdominal pain is happened in the past approximately one year ago, but she does not know why, she says it is because her intestine had torn. Describes her pain is severe 10/10 located in the upper abdomen. Denies any chest pain or trouble breathing.  Past Medical History:  Diagnosis Date  . Asthma   . COPD (chronic obstructive pulmonary disease) ([place])   . Diabetes mellitus without complication ([place])   . Diastolic heart failure ([place])   . ESRD (end stage renal disease) on dialysis ([place])   . Hypertension   . Neuropathy ([place])   . Polycystic kidney disease   . Renal insufficiency     Patient Active Problem List   Diagnosis Date Noted  . Unstable angina ([place]) 03/17/2016  . Chronic diastolic heart failure ([place]) 02/04/2016  . Hoarseness of voice 02/04/2016  . ESRD on dialysis ([place]) 12/22/2015  . Benign essential HTN 12/22/2015  . Hyperlipidemia 12/22/2015    Past Surgical History:  Procedure Laterality Date  . ARTERIOVENOUS GRAFT PLACEMENT Right    x3 (R forearm currently used for access)  . COLON SURGERY    . VEIN HARVEST      Prior to Admission medications   Medication Sig Start Date End Date Taking? Authorizing  Provider  acetaminophen (TYLENOL) 325 MG tablet Take 2 tablets (650 mg total) by mouth every 6 (six) hours as needed for mild pain or headache. 03/18/16   Nicholes Mango, MD  albuterol (PROVENTIL HFA;VENTOLIN HFA) 108 (90 Base) MCG/ACT inhaler Inhale 2 puffs into the lungs every 6 (six) hours as needed for wheezing or shortness of breath. 03/05/16 06/03/16  Alisa Graff, FNP  amLODipine (NORVASC) 10 MG tablet Take 1 tablet (10 mg total) by mouth at bedtime. 01/17/16   Gladstone Lighter, MD  aspirin 81 MG chewable tablet Chew 1 tablet (81 mg total) by mouth daily. 01/17/16   Gladstone Lighter, MD  b complex-vitamin c-folic acid (NEPHRO-VITE) 0.8 MG TABS tablet Take 1 tablet by mouth at bedtime.    Historical Provider, MD  calcium acetate (PHOSLO) 667 MG capsule Take 667 mg by mouth 3 (three) times daily with meals.     Historical Provider, MD  cinacalcet (SENSIPAR) 30 MG tablet Take 30 mg by mouth daily.    Historical Provider, MD  cloNIDine (CATAPRES) 0.1 MG tablet Take 1 tablet (0.1 mg total) by mouth 2 (two) times daily. 01/17/16   Gladstone Lighter, MD  furosemide (LASIX) 80 MG tablet Take 1 tablet (80 mg total) by mouth daily. 03/05/16 06/03/16  Alisa Graff, FNP  gabapentin (NEURONTIN) 300 MG capsule Take 300 mg by mouth 2 (two) times daily.     Historical Provider, MD  irbesartan (AVAPRO) 150 MG tablet Take 1 tablet (150 mg total) by  mouth at bedtime. 01/17/16   Gladstone Lighter, MD  metoprolol succinate (TOPROL-XL) 50 MG 24 hr tablet Take 50 mg by mouth daily. Take with or immediately following a meal.    Historical Provider, MD  omeprazole (PRILOSEC) 20 MG capsule Take 40 mg by mouth daily.     Historical Provider, MD  oxyCODONE-acetaminophen (PERCOCET) 7.5-325 MG tablet Take 1 tablet by mouth every 8 (eight) hours as needed for moderate pain. Patient taking differently: Take 1 tablet by mouth daily as needed for moderate pain.  01/17/16   Gladstone Lighter, MD  rosuvastatin (CRESTOR) 5 MG tablet Take 5  mg by mouth daily.    Historical Provider, MD    Allergies  Allergen Reactions  . Morphine And Related Nausea And Vomiting  . Vicodin [Hydrocodone-Acetaminophen] Nausea And Vomiting  . Buprenorphine Hcl Nausea And Vomiting  . Codeine Nausea And Vomiting    Family History  Problem Relation Age of Onset  . Hypertension Brother     Social History Social History  Substance Use Topics  . Smoking status: Former Smoker    Packs/day: 0.25    Quit date: 08/11/2015  . Smokeless tobacco: Never Used  . Alcohol use No    Review of Systems Constitutional: Negative for fever. Cardiovascular: Negative for chest pain. Respiratory: Negative for shortness of breath. Gastrointestinal: Upper abdominal pain. Positive for nausea. Negative for vomiting or diarrhea. Genitourinary: Negative for dysuria. Still makes urine every day. Musculoskeletal: Negative for back pain. Neurological: Negative for headaches, focal weakness 10-point ROS otherwise negative.  ____________________________________________   PHYSICAL EXAM:  VITAL SIGNS: ED Triage Vitals [03/28/16 0803]  Enc Vitals Group     BP (!) 179/102     Pulse Rate (!) 104     Resp 20     Temp 97.7 F (36.5 C)     Temp Source Oral     SpO2 99 %     Weight      Height      Head Circumference      Peak Flow      Pain Score 10     Pain Loc      Pain [place]?      Excl. in [place]?     Constitutional: Alert and oriented. Well appearing and in no distress. Eyes: Normal exam ENT   Head: Normocephalic and atraumatic   Mouth/Throat: Mucous membranes are moist. Cardiovascular: Normal rate, regular rhythm. No murmur Respiratory: Normal respiratory effort without tachypnea nor retractions. Breath sounds are clear  Gastrointestinal: Soft, moderate tenderness palpation in the right upper quadrant, epigastrium and left upper quadrant, no rebound or guarding. No distention. Musculoskeletal: Nontender with normal range of motion in all  extremities. Neurologic:  Normal speech and language. No gross focal neurologic deficits Skin:  Skin is warm, dry and intact.  Psychiatric: Mood and affect are normal. Speech and behavior are normal.   ____________________________________________   RADIOLOGY  CT shows possible signs of interstitial edema. Abdomen/pelvis largely nonrevealing.  Chest x-ray clear. ____________________________________________   INITIAL IMPRESSION / ASSESSMENT AND PLAN / ED COURSE  Pertinent labs & imaging results that were available during my care of the patient were reviewed by me and considered in my medical decision making (see chart for details).  The patient presents the emergency department with upper abdominal pain for the past 3 days. Patient came to the emergency department yesterday, had a workup performed showing largely unrevealing labs including normal lipase, LFTs, white blood cell count in largely normal urinalysis. We  will treat the patient's discomfort, nausea, and obtain a CT scan of the abdomen and pelvis to further evaluate.   Patient's lab work yesterday is largely within normal limits. CT pending. However the patient continues to desat into the low to mid 80s with a good waveform. Patient currently with an room air saturation of 86% with good waveform. No home O2 requirement. We will obtain a chest x-ray as well as cardiac enzymes, BNP and closely monitor in the emergency department. At the patient does not have oxygen at home and despite likely admission to the hospital for hypoxia.  Patient's chest x-ray is negative. CT scan shows no acute change in the abdomen, but does show signs of pulmonary edema in the chest. Given the patient's hypoxia signs of interstitial/pulmonary edema CT scan we will start the patient on IV Lasix, oxygen, and admit to the hospital.  I will contact nephrology to see if the patient could be dialyzed.  I discussed the patient with Dr.  Holley Raring.  ____________________________________________   FINAL CLINICAL IMPRESSION(S) / ED DIAGNOSES  Upper abdominal pain Hypoxia Congestive heart failure    Harvest Dark, MD 03/28/16 [place], MD 03/28/16 [place], MD 03/28/16 1413

## 2016-03-28 NOTE — Progress Notes (Signed)
POST DIALYSIS ASSESSMENT 

## 2016-03-28 NOTE — H&P (Signed)
[facility] [place] NAME: Jasmine Holloway    MR#:  [id number]  DATE OF BIRTH:  1961/04/19  DATE OF ADMISSION:  03/28/2016  PRIMARY CARE PHYSICIAN: Elyse Jarvis, MD   REQUESTING/REFERRING PHYSICIAN: Paduchowsky  CHIEF COMPLAINT:   Chief Complaint  Patient presents with  . Abdominal Pain    HISTORY OF PRESENT ILLNESS: Jasmine Holloway  is a 55 y.o. female with a known history of COPD, diabetes, diastolic heart failure, end-stage renal disease on hemodialysis, hypertension, polycystic kidney disease, multiple intestinal and hernia surgeries- started having abdominal pain for last 2-3 days which is associated with liquids and sharp nonradiating type. The pain gets worse with the food. Concerned with this she came yesterday to emergency room, and before she can be evaluated she decided to go home in the nighttime. Today morning again the pain came back after drinking coffee, so she decided to come to emergency room. She had some nausea but no episodes of vomiting. She had 3 bowel movements so far since the pain started, and they were regular stool, without any presence of blood. Patient confirms she had gastric ulcers in the past, but denies using any over-the-counter pain medications currently. CT scan of the abdomen was done today which did not show any abnormalities, but showed cardiomegaly and some pulmonary edema so given his admission for congestive heart failure. Patient also had some hypoxia and requiring supplemental oxygen. Her last hemodialysis was yesterday and it was irregular dialysis.  PAST MEDICAL HISTORY:   Past Medical History:  Diagnosis Date  . Asthma   . COPD (chronic obstructive pulmonary disease) ([place])   . Diabetes mellitus without complication ([place])   . Diastolic heart failure ([place])   . ESRD (end stage renal disease) on dialysis ([place])   . Hypertension   . Neuropathy ([place])   . Polycystic kidney disease   . Renal insufficiency      PAST SURGICAL HISTORY:  Past Surgical History:  Procedure Laterality Date  . ARTERIOVENOUS GRAFT PLACEMENT Right    x3 (R forearm currently used for access)  . COLON SURGERY    . VEIN HARVEST      SOCIAL HISTORY:  Social History  Substance Use Topics  . Smoking status: Former Smoker    Packs/day: 0.25    Quit date: 08/11/2015  . Smokeless tobacco: Never Used  . Alcohol use No    FAMILY HISTORY:  Family History  Problem Relation Age of Onset  . Hypertension Brother   . Heart failure Brother     DRUG ALLERGIES:  Allergies  Allergen Reactions  . Morphine And Related Nausea And Vomiting  . Vicodin [Hydrocodone-Acetaminophen] Nausea And Vomiting  . Buprenorphine Hcl Nausea And Vomiting  . Codeine Nausea And Vomiting    REVIEW OF SYSTEMS:   CONSTITUTIONAL: No fever, fatigue or weakness.  EYES: No blurred or double vision.  EARS, NOSE, AND THROAT: No tinnitus or ear pain.  RESPIRATORY: No cough,Some shortness of breath, no wheezing or hemoptysis.  CARDIOVASCULAR: No chest pain, orthopnea, edema.  GASTROINTESTINAL: Positive for nausea, no vomiting, diarrhea , have epigastric abdominal pain.  GENITOURINARY: No dysuria, hematuria.  ENDOCRINE: No polyuria, nocturia,  HEMATOLOGY: No anemia, easy bruising or bleeding SKIN: No rash or lesion. MUSCULOSKELETAL: No joint pain or arthritis.   NEUROLOGIC: No tingling, numbness, weakness.  PSYCHIATRY: No anxiety or depression.   MEDICATIONS AT HOME:  Prior to Admission medications   Medication Sig Start Date End Date Taking? Authorizing Provider  acetaminophen (TYLENOL) 325 MG tablet Take 2 tablets (650 mg total) by mouth every 6 (six) hours as needed for mild pain or headache. 03/18/16   Nicholes Mango, MD  albuterol (PROVENTIL HFA;VENTOLIN HFA) 108 (90 Base) MCG/ACT inhaler Inhale 2 puffs into the lungs every 6 (six) hours as needed for wheezing or shortness of breath. 03/05/16 06/03/16  Alisa Graff, FNP  amLODipine (NORVASC)  10 MG tablet Take 1 tablet (10 mg total) by mouth at bedtime. 01/17/16   Gladstone Lighter, MD  aspirin 81 MG chewable tablet Chew 1 tablet (81 mg total) by mouth daily. 01/17/16   Gladstone Lighter, MD  b complex-vitamin c-folic acid (NEPHRO-VITE) 0.8 MG TABS tablet Take 1 tablet by mouth at bedtime.    Historical Provider, MD  calcium acetate (PHOSLO) 667 MG capsule Take 667 mg by mouth 3 (three) times daily with meals.     Historical Provider, MD  cinacalcet (SENSIPAR) 30 MG tablet Take 30 mg by mouth daily.    Historical Provider, MD  cloNIDine (CATAPRES) 0.1 MG tablet Take 1 tablet (0.1 mg total) by mouth 2 (two) times daily. 01/17/16   Gladstone Lighter, MD  furosemide (LASIX) 80 MG tablet Take 1 tablet (80 mg total) by mouth daily. 03/05/16 06/03/16  Alisa Graff, FNP  gabapentin (NEURONTIN) 300 MG capsule Take 300 mg by mouth 2 (two) times daily.     Historical Provider, MD  irbesartan (AVAPRO) 150 MG tablet Take 1 tablet (150 mg total) by mouth at bedtime. 01/17/16   Gladstone Lighter, MD  metoprolol succinate (TOPROL-XL) 50 MG 24 hr tablet Take 50 mg by mouth daily. Take with or immediately following a meal.    Historical Provider, MD  omeprazole (PRILOSEC) 20 MG capsule Take 40 mg by mouth daily.     Historical Provider, MD  oxyCODONE-acetaminophen (PERCOCET) 7.5-325 MG tablet Take 1 tablet by mouth every 8 (eight) hours as needed for moderate pain. Patient taking differently: Take 1 tablet by mouth daily as needed for moderate pain.  01/17/16   Gladstone Lighter, MD  rosuvastatin (CRESTOR) 5 MG tablet Take 5 mg by mouth daily.    Historical Provider, MD      PHYSICAL EXAMINATION:   VITAL SIGNS: Blood pressure (!) 174/108, pulse 86, temperature 97.7 F (36.5 C), temperature source Oral, resp. rate 17, SpO2 90 %.  GENERAL:  55 y.o.-year-old patient lying in the bed with no acute distress.  EYES: Pupils equal, round, reactive to light and accommodation. No scleral icterus. Extraocular muscles  intact.  HEENT: Head atraumatic, normocephalic. Oropharynx and nasopharynx clear.  NECK:  Supple, no jugular venous distention. No thyroid enlargement, no tenderness.  LUNGS: Normal breath sounds bilaterally, no wheezing, Mild bilateral crepitation. No use of accessory muscles of respiration.  CARDIOVASCULAR: S1, S2 normal. No murmurs, rubs, or gallops.  ABDOMEN: Soft, Epigastric tenderness, nondistended. Bowel sounds present. No organomegaly or mass. Surgical scar present. EXTREMITIES: No pedal edema, cyanosis, or clubbing.  NEUROLOGIC: Cranial nerves II through XII are intact. Muscle strength 5/5 in all extremities. Sensation intact. Gait not checked.  PSYCHIATRIC: The patient is alert and oriented x 3.  SKIN: No obvious rash, lesion, or ulcer.   LABORATORY PANEL:   CBC  Recent Labs Lab 03/27/16 1722  WBC 8.6  HGB 9.8*  HCT 29.1*  PLT 403  MCV 86.9  MCH 29.4  MCHC 33.9  RDW 20.5*   ------------------------------------------------------------------------------------------------------------------  Chemistries   Recent Labs Lab 03/27/16 1722  NA 142  K 4.1  CL 99*  CO2 34*  GLUCOSE 90  BUN 15  CREATININE 4.67*  CALCIUM 8.7*  AST 24  ALT 22  ALKPHOS 59  BILITOT 0.4   ------------------------------------------------------------------------------------------------------------------ estimated creatinine clearance is 13.4 mL/min (by C-G formula based on SCr of 4.67 mg/dL). ------------------------------------------------------------------------------------------------------------------ No results for input(s): TSH, T4TOTAL, T3FREE, THYROIDAB in the last 72 hours.  Invalid input(s): FREET3   Coagulation profile No results for input(s): INR, PROTIME in the last 168 hours. ------------------------------------------------------------------------------------------------------------------- No results for input(s): DDIMER in the last 72  hours. -------------------------------------------------------------------------------------------------------------------  Cardiac Enzymes  Recent Labs Lab 03/28/16 1323  TROPONINI 0.06*   ------------------------------------------------------------------------------------------------------------------ Invalid input(s): POCBNP  ---------------------------------------------------------------------------------------------------------------  Urinalysis    Component Value Date/Time   COLORURINE STRAW (A) 03/27/2016 1722   APPEARANCEUR CLEAR (A) 03/27/2016 1722   LABSPEC 1.005 03/27/2016 1722   PHURINE 9.0 (H) 03/27/2016 1722   GLUCOSEU 150 (A) 03/27/2016 1722   HGBUR NEGATIVE 03/27/2016 1722   BILIRUBINUR NEGATIVE 03/27/2016 1722   KETONESUR NEGATIVE 03/27/2016 1722   PROTEINUR >500 (A) 03/27/2016 1722   NITRITE NEGATIVE 03/27/2016 1722   LEUKOCYTESUR NEGATIVE 03/27/2016 1722     RADIOLOGY: Ct Abdomen Pelvis Wo Contrast  Result Date: 03/28/2016 CLINICAL DATA:  Pt presents to ED with reports of upper abdominal pain for two days. Pt denies nausea, vomiting or diarrhea. Pt states the pain is an ulcer feeling, states it feels like she has a lump. Pt states she had dialysis today. Pt reports swelling in her ankles EXAM: CT ABDOMEN AND PELVIS WITHOUT CONTRAST TECHNIQUE: Multidetector CT imaging of the abdomen and pelvis was performed following the standard protocol without IV contrast. COMPARISON:  01/10/2016 FINDINGS: Lung bases: Diffuse interstitial thickening. Small right and minimal left pleural effusions. Heart mildly enlarged. Liver, spleen, gallbladder, pancreas, adrenal glands:  Unremarkable. Kidneys, ureters, bladder: Bilateral renal cortical thinning/ atrophy, left greater than right. 19 mm low-density mass arises from the upper pole the right kidney. 18 mm low-density mass arises from the lower pole the right kidney, both essentially stable and consistent with cysts. No renal  stones. No hydronephrosis. Ureters and bladder are unremarkable. Uterus and adnexa:  Unremarkable. Vascular: Minimal atherosclerotic calcification along the upper abdominal aorta. No aneurysm. Lymph nodes:  No adenopathy. Ascites:  None. Gastrointestinal: Stomach and small bowel are unremarkable. Mild generalized increased stool in the colon. No colonic wall thickening or inflammatory changes. A few diverticula are noted along the right colon. No diverticulitis. Appendix not visualized. Abdominal wall: Bowel bulges between the rectus abdominus muscles at the level of the umbilicus. Fascia appears contained. No convincing hernia. Musculoskeletal: Mild degenerative changes of the visualized spine. No osteoblastic or osteolytic lesions. IMPRESSION: 1. Mild cardiomegaly diffuse interstitial thickening and small right minimal left pleural effusions. Findings are consistent with mild congestive heart failure with interstitial pulmonary edema. 2. New no acute findings within the abdomen or pelvis. Cause of the patient's upper abdominal pain is not clear. 3. Mild increased stool in the colon.  No bowel inflammation. 4. Significant renal cortical thinning, left greater than right with stable bilateral renal cysts. Electronically Signed   By: Lajean Manes M.D.   On: 03/28/2016 12:37   Dg Chest 2 View  Result Date: 03/28/2016 CLINICAL DATA:  Nausea with pain EXAM: CHEST  2 VIEW COMPARISON:  March 17, 2016 FINDINGS: There is interstitial edema with cardiomegaly and mild pulmonary venous hypertension. There is no airspace consolidation. No adenopathy. There are surgical clips in left axillary region. IMPRESSION: Persistent changes of congestive heart failure.  No airspace consolidation. No new opacity. Stable cardiac silhouette. Electronically Signed   By: Lowella Grip III M.D.   On: 03/28/2016 13:16    EKG: Orders placed or performed during the hospital encounter of 03/27/16  . EKG 12-Lead  . EKG 12-Lead  . ED  EKG  . ED EKG    IMPRESSION AND PLAN:  * Acute on chronic diastolic congestive heart failure    previous echocardiogram done 2 months ago.    Nephrology consult was called in by ER physician to have dialysis today.    Consult Lasix meanwhile.    Continue supplemental oxygen.  * Acute hypoxic respiratory failure   Due to congestive heart failure   Continue supplemental oxygen.  * EpiGastric pain    CT scan abdomen is negative.    may be gastric ulcer disease,     continue Protonix twice a day.    If does not resolve, we may need to call GI consult.  * End-stage renal disease on hemodialysis   Nephrology consult to help with dialysis.    * Hypertension   Continue home medication, patient will get additional hemodialysis today.     All the records are reviewed and case discussed with ED provider. Management plans discussed with the patient, family and they are in agreement.  CODE STATUS: full code  Code Status History    Date Active Date Inactive Code Status Order ID Comments User Context   03/17/2016  6:06 AM 03/18/2016  7:51 AM Full Code [id number]  Saundra Shelling, MD ED   01/10/2016  9:11 PM 01/17/2016  9:36 PM Full Code [id number]  Mikael Spray, NP ED   12/21/2015  6:08 AM 12/22/2015  4:09 PM Full Code [id number]  Saundra Shelling, MD Inpatient   02/24/2015  3:52 AM 02/24/2015  4:15 PM Full Code [id number]  Harrie Foreman, MD Inpatient       TOTAL TIME TAKING CARE OF THIS PATIENT: 50  minutes.    Vaughan Basta M.D on 03/28/2016   Between 7am to 6pm - Pager - [phone]  After 6pm go to [email or web address] - password [email or web address] [facility] [place] [facility]  Office  [phone]  CC: Primary care physician; Elyse Jarvis, MD   Note: This dictation was prepared with Dragon dictation along with smaller phrase technology. Any transcriptional errors that result from this process are unintentional.

## 2016-03-28 NOTE — ED Notes (Signed)
Pt resting in bed at this time. Respirations even and unlabored. Will continue to monitor for further patient needs.

## 2016-03-28 NOTE — ED Notes (Signed)
Report called to [place], Therapist, sports.

## 2016-03-28 NOTE — ED Notes (Signed)
Pt taken to Xray.

## 2016-03-28 NOTE — ED Notes (Signed)
Pt placed back on oxygen due to O2 saturation 83-85%.

## 2016-03-28 NOTE — ED Notes (Signed)
Pt taken to CT at this time.

## 2016-03-28 NOTE — ED Notes (Signed)
Pt taken off oxygen at this time to monitor her O2 saturations.

## 2016-03-28 NOTE — ED Notes (Signed)
Pt resting in bed at this time. NAD noted. States that she is hungry. Denies any other needs at this time.

## 2016-03-28 NOTE — ED Notes (Signed)
Patient vomited x 1 while RN in room

## 2016-03-28 NOTE — ED Notes (Signed)
Spoke with Dr. Anselm Jungling again regarding changing pt bed status to non-tele due to not having tele orders, spoke with Eduard Clos, RN in dialysis and states that he will be ready for the patient  In approx 1 hr. Dr. Molli Hazard, states he would change the admission order.
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
## 2016-03-28 NOTE — ED Notes (Signed)
Patient to ED with abdominal pain for the past 2 days. Patient was in the ED yesterday and had blood work but left without being seen. Patient c/o mid and upper abdominal pain. Patient denies vomiting but states that she was nauseated this morning.

## 2016-03-28 NOTE — ED Triage Notes (Signed)
Pt presents with abd pain started yesterday. Pt was here yesterday for same, had protocols completed and left without being seen due to the wait times.

## 2016-03-29 LAB — BASIC METABOLIC PANEL
ANION GAP: 8 (ref 5–15)
BUN: 17 mg/dL (ref 6–20)
CO2: 32 mmol/L (ref 22–32)
Calcium: 8.9 mg/dL (ref 8.9–10.3)
Chloride: 99 mmol/L — ABNORMAL LOW (ref 101–111)
Creatinine, Ser: 5.02 mg/dL — ABNORMAL HIGH (ref 0.44–1.00)
GFR calc Af Amer: 10 mL/min — ABNORMAL LOW (ref >60)
GFR, EST NON AFRICAN AMERICAN: 9 mL/min — AB (ref >60)
Glucose, Bld: 83 mg/dL (ref 65–99)
POTASSIUM: 4.3 mmol/L (ref 3.5–5.1)
Sodium: 139 mmol/L (ref 135–145)

## 2016-03-29 LAB — MRSA PCR SCREENING: MRSA by PCR: NEGATIVE

## 2016-03-29 LAB — CBC
HEMATOCRIT: 28.1 % — AB (ref 35.0–47.0)
HEMOGLOBIN: 9.1 g/dL — AB (ref 12.0–16.0)
MCH: 28.3 pg (ref 26.0–34.0)
MCHC: 32.4 g/dL (ref 32.0–36.0)
MCV: 87.3 fL (ref 80.0–100.0)
Platelets: 270 10*3/uL (ref 150–440)
RBC: 3.22 MIL/uL — ABNORMAL LOW (ref 3.80–5.20)
RDW: 20.7 % — ABNORMAL HIGH (ref 11.5–14.5)
WBC: 7 10*3/uL (ref 3.6–11.0)

## 2016-03-29 MED ORDER — SODIUM CHLORIDE 0.9% FLUSH
3.0000 mL | Freq: Two times a day (BID) | INTRAVENOUS | Status: DC
  Administered 2016-03-29: 3 mL via INTRAVENOUS

## 2016-03-29 NOTE — Progress Notes (Signed)
Discharge to home. She will drive herself as she drove to the ER yesterday.  Breathing much improved.  Abdominal pain resolved.  Reviewed upcoming appointments .  No change in medications, Gave her written list of what's been given today.

## 2016-03-29 NOTE — Care Management Note (Signed)
Case Management Note  Patient Details  Name: Jasmine Holloway MRN: [id number] Date of Birth: [birth year]  Subjective/Objective:     Text message to [place] S[place] per HD at [facility] on [place] M-W-F.                Action/Plan:   Expected Discharge Date:                  Expected Discharge Plan:     In-House Referral:     Discharge planning Services     Post Acute Care Choice:    Choice offered to:     DME Arranged:    DME Agency:     HH Arranged:    HH Agency:     Status of Service:     If discussed at [employer] of Stay Meetings, dates discussed:    Additional Comments:  Webb Weed A, RN 03/29/2016, 1:25 PM

## 2016-03-29 NOTE — Progress Notes (Addendum)
A&O. Up with standby assist. Dialysis MWF. Admitted from home for abdominal pain. Patient has received phenergan and percocet. SLept the rest of the night.

## 2016-03-29 NOTE — Progress Notes (Signed)
[facility]  ROUNDING NOTE   Subjective:  Patient well known to [name] as an outpatient. We follow her for outpatient dialysis. She presents with abdominal pain that has been slowly progressive over the past week. Currently she states that it has resolved. She denies nausea or vomiting at home. Next line she also denies blood in her stool. CT scan abdomen and pelvis was negative for obvious cause of abdominal pain.   Objective:  Vital signs in last 24 hours:  Temp:  [97.8 F (36.6 C)-98.4 F (36.9 C)] 98.2 F (36.8 C) (08/20 0803) Pulse Rate:  [65-104] 74 (08/20 0803) Resp:  [11-32] 18 (08/20 0803) BP: (145-200)/(89-124) 161/91 (08/20 0803) SpO2:  [81 %-100 %] 95 % (08/20 0803) Weight:  [77.1 kg (169 lb 14.4 oz)-80.8 kg (178 lb 2.1 oz)] 77.1 kg (169 lb 14.4 oz) (08/20 0023)  Weight change:  Filed Weights   03/28/16 2000 03/28/16 2300 03/29/16 0023  Weight: 80.8 kg (178 lb 2.1 oz) 77.3 kg (170 lb 6.7 oz) 77.1 kg (169 lb 14.4 oz)    Intake/Output: I/O last 3 completed shifts: In: -  Out: 3000 [Other:3000]   Intake/Output this shift:  Total I/O In: 240 [P.O.:240] Out: -   Physical Exam: General: No acute distress  Head: Normocephalic, atraumatic. Moist oral mucosal membranes  Eyes: Anicteric  Neck: Supple, trachea midline  Lungs:  Clear to auscultation, normal effort  Heart: S1S2 no rubs  Abdomen:  Mild midline abdominal tenderness, no rebound or guarding, BS present  Extremities: Trace peripheral edema.  Neurologic: Nonfocal, moving all four extremities  Skin: No lesions  Access: Right forearm AVG    Basic Metabolic Panel:  Recent Labs Lab 03/27/16 1722 03/28/16 1827 03/29/16 0550  NA 142  --  139  K 4.1  --  4.3  CL 99*  --  99*  CO2 34*  --  32  GLUCOSE 90  --  83  BUN 15  --  17  CREATININE 4.67* 7.44* 5.02*  CALCIUM 8.7*  --  8.9    Liver Function Tests:  Recent Labs Lab 03/27/16 1722  AST 24  ALT 22  ALKPHOS 59  BILITOT 0.4   PROT 8.5*  ALBUMIN 3.6    Recent Labs Lab 03/27/16 1722  LIPASE 16   No results for input(s): AMMONIA in the last 168 hours.  CBC:  Recent Labs Lab 03/27/16 1722 03/28/16 1827 03/29/16 0550  WBC 8.6 11.2* 7.0  HGB 9.8* 10.0* 9.1*  HCT 29.1* 30.7* 28.1*  MCV 86.9 87.8 87.3  PLT 403 440 270    Cardiac Enzymes:  Recent Labs Lab 03/28/16 1323  TROPONINI 0.06*    BNP: Invalid input(s): POCBNP  CBG: No results for input(s): GLUCAP in the last 168 hours.  Microbiology: Results for orders placed or performed during the hospital encounter of 03/28/16  MRSA PCR Screening     Status: None   Collection Time: 03/29/16  1:01 AM  Result Value Ref Range Status   MRSA by PCR NEGATIVE NEGATIVE Final    Comment:        The GeneXpert MRSA Assay (FDA approved for NASAL specimens only), is one component of a comprehensive MRSA colonization surveillance program. It is not intended to diagnose MRSA infection nor to guide or monitor treatment for MRSA infections.     Coagulation Studies: No results for input(s): LABPROT, INR in the last 72 hours.  Urinalysis:  Recent Labs  03/27/16 1722  COLORURINE STRAW*  LABSPEC 1.005  PHURINE 9.0*  GLUCOSEU 150*  HGBUR NEGATIVE  BILIRUBINUR NEGATIVE  KETONESUR NEGATIVE  PROTEINUR >500*  NITRITE NEGATIVE  LEUKOCYTESUR NEGATIVE      Imaging: Ct Abdomen Pelvis Wo Contrast  Result Date: 03/28/2016 CLINICAL DATA:  Pt presents to ED with reports of upper abdominal pain for two days. Pt denies nausea, vomiting or diarrhea. Pt states the pain is an ulcer feeling, states it feels like she has a lump. Pt states she had dialysis today. Pt reports swelling in her ankles EXAM: CT ABDOMEN AND PELVIS WITHOUT CONTRAST TECHNIQUE: Multidetector CT imaging of the abdomen and pelvis was performed following the standard protocol without IV contrast. COMPARISON:  01/10/2016 FINDINGS: Lung bases: Diffuse interstitial thickening. Small right and  minimal left pleural effusions. Heart mildly enlarged. Liver, spleen, gallbladder, pancreas, adrenal glands:  Unremarkable. Kidneys, ureters, bladder: Bilateral renal cortical thinning/ atrophy, left greater than right. 19 mm low-density mass arises from the upper pole the right kidney. 18 mm low-density mass arises from the lower pole the right kidney, both essentially stable and consistent with cysts. No renal stones. No hydronephrosis. Ureters and bladder are unremarkable. Uterus and adnexa:  Unremarkable. Vascular: Minimal atherosclerotic calcification along the upper abdominal aorta. No aneurysm. Lymph nodes:  No adenopathy. Ascites:  None. Gastrointestinal: Stomach and small bowel are unremarkable. Mild generalized increased stool in the colon. No colonic wall thickening or inflammatory changes. A few diverticula are noted along the right colon. No diverticulitis. Appendix not visualized. Abdominal wall: Bowel bulges between the rectus abdominus muscles at the level of the umbilicus. Fascia appears contained. No convincing hernia. Musculoskeletal: Mild degenerative changes of the visualized spine. No osteoblastic or osteolytic lesions. IMPRESSION: 1. Mild cardiomegaly diffuse interstitial thickening and small right minimal left pleural effusions. Findings are consistent with mild congestive heart failure with interstitial pulmonary edema. 2. New no acute findings within the abdomen or pelvis. Cause of the patient's upper abdominal pain is not clear. 3. Mild increased stool in the colon.  No bowel inflammation. 4. Significant renal cortical thinning, left greater than right with stable bilateral renal cysts. Electronically Signed   By: Lajean Manes M.D.   On: 03/28/2016 12:37   Dg Chest 2 View  Result Date: 03/28/2016 CLINICAL DATA:  Nausea with pain EXAM: CHEST  2 VIEW COMPARISON:  March 17, 2016 FINDINGS: There is interstitial edema with cardiomegaly and mild pulmonary venous hypertension. There is no  airspace consolidation. No adenopathy. There are surgical clips in left axillary region. IMPRESSION: Persistent changes of congestive heart failure. No airspace consolidation. No new opacity. Stable cardiac silhouette. Electronically Signed   By: Lowella Grip III M.D.   On: 03/28/2016 13:16     Medications:     . amLODipine  10 mg Oral QHS  . aspirin  81 mg Oral Daily  . calcium acetate  667 mg Oral TID WC  . cinacalcet  30 mg Oral Q breakfast  . cloNIDine  0.1 mg Oral BID  . furosemide  80 mg Oral Daily  . gabapentin  300 mg Oral BID  . heparin  5,000 Units Subcutaneous Q8H  . irbesartan  150 mg Oral QHS  . metoprolol succinate  50 mg Oral Daily  . multivitamin  1 tablet Oral QHS  . pantoprazole  40 mg Oral BID  . rosuvastatin  5 mg Oral Daily  . sodium chloride flush  3 mL Intravenous Q12H   acetaminophen, albuterol, oxyCODONE-acetaminophen  Assessment/ Plan:  55 y.o. female with past medical history of  polycystic kidney, hypertension, GERD, history of perotinitis with PD, SHPTH, anemia of CKD, hx of admission for volume overload. Started HD 01/2011.    [facility]/MWF/[address]   1. End-stage renal disease on HD MWF. Patient was found have pulmonary edema on abdominal CT incidentally. Therefore we proceeded with dialysis yesterday. She has improved today. No indication for dialysis today. We will plan for dialysis again tomorrow if patient is still here.  2. Abdominal pain. Patient presented with midline upper abdominal pain. Her abdominal pain has improved now. Continue to monitor clinically. She is on pantoprazole for now. CT scan abdomen and pelvis was negative.  3. Anemia of chronic kidney disease.  Hemoglobin 9.8. Resume Epogen with dialysis if she has dialysis tomorrow here.  4. Secondary hyperparathyroidism. Continue cinacalcet and calcium acetate for now. Follow-up PTH and phosphorus with next dialysis treatment.  5. Hypertension.  Blood pressure currently 161/91.  Continue irbesartan, metoprolol, clonidine, and amlodipine.   LOS: 1 Jasmine Holloway 8/20/201710:55 AM

## 2016-03-29 NOTE — Discharge Summary (Signed)
Jasmine Holloway at [place] NAME: Jasmine Holloway    MR#:  [id number]  DATE OF BIRTH:  16-Mar-1961  DATE OF ADMISSION:  03/28/2016 ADMITTING PHYSICIAN: Vaughan Basta, MD  DATE OF DISCHARGE: 03/29/2016  2:21 PM  PRIMARY CARE PHYSICIAN: Elyse Jarvis, MD    ADMISSION DIAGNOSIS:  Pain of upper abdomen [R10.10] Congestive heart failure, unspecified congestive heart failure chronicity, unspecified congestive heart failure type ([place]) [I50.9]  DISCHARGE DIAGNOSIS:  Principal Problem:   Acute on chronic diastolic CHF (congestive heart failure) (HCC) Active Problems:   Epigastric pain   Acute CHF (congestive heart failure) (HCC)   CHF (congestive heart failure) ([facility])   SECONDARY DIAGNOSIS:   Past Medical History:  Diagnosis Date  . Asthma   . COPD (chronic obstructive pulmonary disease) ([place])   . Diabetes mellitus without complication ([place])   . Diastolic heart failure ([place])   . ESRD (end stage renal disease) on dialysis ([place])   . Hypertension   . Neuropathy ([place])   . Polycystic kidney disease   . Renal insufficiency     HOSPITAL COURSE:   1. Acute diastolic congestive heart failure. In speaking with Dr. Holley Raring nephrology she sometimes does not stay for the entire dialysis session. She must stay for the entire dialysis session to remove fluid to avoid heart failure and readmissions. She does urinate and has Lasix at home. 2. Acute hypoxic respiratory failure resolved. Off oxygen. 3. Epigastric pain. This has also resolved. Continue PPI 4. End-stage renal disease on hemodialysis. Urgent dialysis needed on Saturday night. Breathing much better and not needing dialysis on Sunday. Follow-up as outpatient on Monday for her usual dialysis. Must stay for the entire dialysis session to remove fluid. 5. Essential hypertension blood pressure on the higher side. In speaking with Dr. Holley Raring continue usual medications. He thinks this is fluid related. 6.  Anemia of chronic disease [place] with dialysis.   DISCHARGE CONDITIONS:   Satisfactory  CONSULTS OBTAINED:  Treatment Team:  Anthonette Legato, MD  DRUG ALLERGIES:   Allergies  Allergen Reactions  . Morphine And Related Nausea And Vomiting  . Vicodin [Hydrocodone-Acetaminophen] Nausea And Vomiting  . Buprenorphine Hcl Nausea And Vomiting  . Codeine Nausea And Vomiting    DISCHARGE MEDICATIONS:   Discharge Medication List as of 03/29/2016  1:41 PM    CONTINUE these medications which have NOT CHANGED   Details  acetaminophen (TYLENOL) 325 MG tablet Take 2 tablets (650 mg total) by mouth every 6 (six) hours as needed for mild pain or headache., Starting Wed 03/18/2016, OTC    albuterol (PROVENTIL HFA;VENTOLIN HFA) 108 (90 Base) MCG/ACT inhaler Inhale 2 puffs into the lungs every 6 (six) hours as needed for wheezing or shortness of breath., Starting Thu 03/05/2016, Until Wed 06/03/2016, Normal    amLODipine (NORVASC) 10 MG tablet Take 1 tablet (10 mg total) by mouth at bedtime., Starting Fri 01/17/2016, Normal    aspirin 81 MG chewable tablet Chew 1 tablet (81 mg total) by mouth daily., Starting Fri 01/17/2016, Normal    b complex-vitamin c-folic acid (NEPHRO-VITE) 0.8 MG TABS tablet Take 1 tablet by mouth at bedtime., Until Discontinued, Historical Med    calcium acetate (PHOSLO) 667 MG capsule Take 667 mg by mouth 3 (three) times daily with meals. , Until Discontinued, Historical Med    cinacalcet (SENSIPAR) 30 MG tablet Take 30 mg by mouth daily., Until Discontinued, Historical Med    cloNIDine (CATAPRES) 0.1 MG tablet Take 1 tablet (0.1  mg total) by mouth 2 (two) times daily., Starting Fri 01/17/2016, Normal    furosemide (LASIX) 80 MG tablet Take 1 tablet (80 mg total) by mouth daily., Starting Thu 03/05/2016, Until Wed 06/03/2016, Normal    gabapentin (NEURONTIN) 300 MG capsule Take 300 mg by mouth 2 (two) times daily. , Until Discontinued, Historical Med    irbesartan (AVAPRO)  150 MG tablet Take 1 tablet (150 mg total) by mouth at bedtime., Starting Fri 01/17/2016, Normal    metoprolol succinate (TOPROL-XL) 50 MG 24 hr tablet Take 50 mg by mouth daily. Take with or immediately following a meal., Until Discontinued, Historical Med    omeprazole (PRILOSEC) 20 MG capsule Take 40 mg by mouth daily. , Until Discontinued, Historical Med    oxyCODONE-acetaminophen (PERCOCET) 10-325 MG tablet Take 0.5-1 tablets by mouth daily as needed for pain., Starting Wed 03/18/2016, Historical Med    ranitidine (ZANTAC) 150 MG tablet Take 150 mg by mouth 2 (two) times daily., Starting Tue 01/28/2016, Historical Med    rosuvastatin (CRESTOR) 5 MG tablet Take 5 mg by mouth every morning. , Historical Med      STOP taking these medications     oxyCODONE-acetaminophen (PERCOCET) 7.5-325 MG tablet          DISCHARGE INSTRUCTIONS:   Follow up with dialysis Monday Wednesday Friday Follow-up PMD one week  If you experience worsening of your admission symptoms, develop shortness of breath, life threatening emergency, suicidal or homicidal thoughts you must seek medical attention immediately by calling 911 or calling your MD immediately  if symptoms less severe.  You Must read complete instructions/literature along with all the possible adverse reactions/side effects for all the Medicines you take and that have been prescribed to you. Take any new Medicines after you have completely understood and accept all the possible adverse reactions/side effects.   Please note  You were cared for by a hospitalist during your hospital stay. If you have any questions about your discharge medications or the care you received while you were in the hospital after you are discharged, you can call the unit and asked to speak with the hospitalist on call if the hospitalist that took care of you is not available. Once you are discharged, your primary care physician will handle any further medical issues. Please  note that NO REFILLS for any discharge medications will be authorized once you are discharged, as it is imperative that you return to your primary care physician (or establish a relationship with a primary care physician if you do not have one) for your aftercare needs so that they can reassess your need for medications and monitor your lab values.    Today   CHIEF COMPLAINT:   Chief Complaint  Patient presents with  . Abdominal Pain    HISTORY OF PRESENT ILLNESS:  Jasmine Holloway  is a 55 y.o. female with a known history of End-stage renal disease presented with abdominal pain and shortness of breath.   VITAL SIGNS:  Blood pressure (!) 171/90, pulse 68, temperature 98 F (36.7 C), temperature source Oral, resp. rate 18, height 5\' 3"  (1.6 m), weight 77.1 kg (169 lb 14.4 oz), SpO2 93 %.    PHYSICAL EXAMINATION:  GENERAL:  55 y.o.-year-old patient lying in the bed with no acute distress.  EYES: Pupils equal, round, reactive to light and accommodation. No scleral icterus. Extraocular muscles intact.  HEENT: Head atraumatic, normocephalic. Oropharynx and nasopharynx clear.  NECK:  Supple, no jugular venous distention. No thyroid  enlargement, no tenderness.  LUNGS: Normal breath sounds bilaterally, no wheezing, rales,rhonchi or crepitation. No use of accessory muscles of respiration.  CARDIOVASCULAR: S1, S2 normal. No murmurs, rubs, or gallops.  ABDOMEN: Soft, non-tender, non-distended. Bowel sounds present. No organomegaly or mass.  EXTREMITIES: Trace edema, no cyanosis, or clubbing.  NEUROLOGIC: Cranial nerves II through XII are intact. Muscle strength 5/5 in all extremities. Sensation intact. Gait not checked.  PSYCHIATRIC: The patient is alert and oriented x 3.  SKIN: No obvious rash, lesion, or ulcer.   DATA REVIEW:   CBC  Recent Labs Lab 03/29/16 0550  WBC 7.0  HGB 9.1*  HCT 28.1*  PLT 270    Chemistries   Recent Labs Lab 03/27/16 1722  03/29/16 0550  NA 142  --   139  K 4.1  --  4.3  CL 99*  --  99*  CO2 34*  --  32  GLUCOSE 90  --  83  BUN 15  --  17  CREATININE 4.67*  < > 5.02*  CALCIUM 8.7*  --  8.9  AST 24  --   --   ALT 22  --   --   ALKPHOS 59  --   --   BILITOT 0.4  --   --   < > = values in this interval not displayed.  Cardiac Enzymes  Recent Labs Lab 03/28/16 1323  TROPONINI 0.06*    Microbiology Results  Results for orders placed or performed during the hospital encounter of 03/28/16  MRSA PCR Screening     Status: None   Collection Time: 03/29/16  1:01 AM  Result Value Ref Range Status   MRSA by PCR NEGATIVE NEGATIVE Final    Comment:        The GeneXpert MRSA Assay (FDA approved for NASAL specimens only), is one component of a comprehensive MRSA colonization surveillance program. It is not intended to diagnose MRSA infection nor to guide or monitor treatment for MRSA infections.     RADIOLOGY:  Ct Abdomen Pelvis Wo Contrast  Result Date: 03/28/2016 CLINICAL DATA:  Pt presents to ED with reports of upper abdominal pain for two days. Pt denies nausea, vomiting or diarrhea. Pt states the pain is an ulcer feeling, states it feels like she has a lump. Pt states she had dialysis today. Pt reports swelling in her ankles EXAM: CT ABDOMEN AND PELVIS WITHOUT CONTRAST TECHNIQUE: Multidetector CT imaging of the abdomen and pelvis was performed following the standard protocol without IV contrast. COMPARISON:  01/10/2016 FINDINGS: Lung bases: Diffuse interstitial thickening. Small right and minimal left pleural effusions. Heart mildly enlarged. Liver, spleen, gallbladder, pancreas, adrenal glands:  Unremarkable. Kidneys, ureters, bladder: Bilateral renal cortical thinning/ atrophy, left greater than right. 19 mm low-density mass arises from the upper pole the right kidney. 18 mm low-density mass arises from the lower pole the right kidney, both essentially stable and consistent with cysts. No renal stones. No hydronephrosis. Ureters  and bladder are unremarkable. Uterus and adnexa:  Unremarkable. Vascular: Minimal atherosclerotic calcification along the upper abdominal aorta. No aneurysm. Lymph nodes:  No adenopathy. Ascites:  None. Gastrointestinal: Stomach and small bowel are unremarkable. Mild generalized increased stool in the colon. No colonic wall thickening or inflammatory changes. A few diverticula are noted along the right colon. No diverticulitis. Appendix not visualized. Abdominal wall: Bowel bulges between the rectus abdominus muscles at the level of the umbilicus. Fascia appears contained. No convincing hernia. Musculoskeletal: Mild degenerative changes of the visualized spine.  No osteoblastic or osteolytic lesions. IMPRESSION: 1. Mild cardiomegaly diffuse interstitial thickening and small right minimal left pleural effusions. Findings are consistent with mild congestive heart failure with interstitial pulmonary edema. 2. New no acute findings within the abdomen or pelvis. Cause of the patient's upper abdominal pain is not clear. 3. Mild increased stool in the colon.  No bowel inflammation. 4. Significant renal cortical thinning, left greater than right with stable bilateral renal cysts. Electronically Signed   By: Lajean Manes M.D.   On: 03/28/2016 12:37   Dg Chest 2 View  Result Date: 03/28/2016 CLINICAL DATA:  Nausea with pain EXAM: CHEST  2 VIEW COMPARISON:  March 17, 2016 FINDINGS: There is interstitial edema with cardiomegaly and mild pulmonary venous hypertension. There is no airspace consolidation. No adenopathy. There are surgical clips in left axillary region. IMPRESSION: Persistent changes of congestive heart failure. No airspace consolidation. No new opacity. Stable cardiac silhouette. Electronically Signed   By: Lowella Grip III M.D.   On: 03/28/2016 13:16    Management plans discussed with the patient, family and they are in agreement.  CODE STATUS:     Code Status Orders        Start     Ordered    03/28/16 1826  Full code  Continuous     03/28/16 1825    Code Status History    Date Active Date Inactive Code Status Order ID Comments User Context   03/17/2016  6:06 AM 03/18/2016  7:51 AM Full Code [id number]  Saundra Shelling, MD ED   01/10/2016  9:11 PM 01/17/2016  9:36 PM Full Code [id number]  Mikael Spray, NP ED   12/21/2015  6:08 AM 12/22/2015  4:09 PM Full Code [id number]  Saundra Shelling, MD Inpatient   02/24/2015  3:52 AM 02/24/2015  4:15 PM Full Code [id number]  Harrie Foreman, MD Inpatient    Advance Directive Documentation   Flowsheet Row Most Recent Value  Type of Advance Directive  Living will, Healthcare Power of Attorney  Pre-existing out of facility DNR order (yellow form or pink MOST form)  No data  "MOST" Form in Place?  No data      TOTAL TIME TAKING CARE OF THIS PATIENT: 35 minutes.    Loletha Grayer M.D on 03/29/2016 at 4:01 PM  Between 7am to 6pm - Pager - [phone]  After 6pm go to [email or web address] - password [email or web address]  [facility] Office  [phone]  CC: Primary care physician; Elyse Jarvis, MD

## 2016-03-31 LAB — HEPATITIS B SURFACE ANTIGEN: HEP B S AG: NEGATIVE

## 2016-04-24 ENCOUNTER — Emergency Department
Admission: EM | Admit: 2016-04-24 | Discharge: 2016-04-24 | Disposition: A | Discharge status: Home / Self Care | Payer: Medicare Other | Attending: Emergency Medicine | Admitting: Emergency Medicine

## 2016-04-24 ENCOUNTER — Encounter: Payer: Self-pay | Admitting: Emergency Medicine

## 2016-04-24 ENCOUNTER — Emergency Department: Payer: Medicare Other

## 2016-04-24 DIAGNOSIS — I132 Hypertensive heart and chronic kidney disease with heart failure and with stage 5 chronic kidney disease, or end stage renal disease: Secondary | ICD-10-CM | POA: Insufficient documentation

## 2016-04-24 DIAGNOSIS — Z7982 Long term (current) use of aspirin: Secondary | ICD-10-CM | POA: Insufficient documentation

## 2016-04-24 DIAGNOSIS — E1122 Type 2 diabetes mellitus with diabetic chronic kidney disease: Secondary | ICD-10-CM | POA: Insufficient documentation

## 2016-04-24 DIAGNOSIS — J45909 Unspecified asthma, uncomplicated: Secondary | ICD-10-CM | POA: Insufficient documentation

## 2016-04-24 DIAGNOSIS — N186 End stage renal disease: Secondary | ICD-10-CM | POA: Insufficient documentation

## 2016-04-24 DIAGNOSIS — Z87891 Personal history of nicotine dependence: Secondary | ICD-10-CM | POA: Diagnosis not present

## 2016-04-24 DIAGNOSIS — Z992 Dependence on renal dialysis: Secondary | ICD-10-CM | POA: Diagnosis not present

## 2016-04-24 DIAGNOSIS — I5032 Chronic diastolic (congestive) heart failure: Secondary | ICD-10-CM | POA: Diagnosis not present

## 2016-04-24 DIAGNOSIS — J441 Chronic obstructive pulmonary disease with (acute) exacerbation: Secondary | ICD-10-CM | POA: Insufficient documentation

## 2016-04-24 DIAGNOSIS — R0603 Acute respiratory distress: Secondary | ICD-10-CM

## 2016-04-24 DIAGNOSIS — Z79899 Other long term (current) drug therapy: Secondary | ICD-10-CM | POA: Diagnosis not present

## 2016-04-24 DIAGNOSIS — R0602 Shortness of breath: Secondary | ICD-10-CM | POA: Diagnosis present

## 2016-04-24 LAB — URINE DRUG SCREEN, QUALITATIVE (ARMC ONLY)
AMPHETAMINES, UR SCREEN: NOT DETECTED
BARBITURATES, UR SCREEN: NOT DETECTED
BENZODIAZEPINE, UR SCRN: NOT DETECTED
COCAINE METABOLITE, UR ~~LOC~~: NOT DETECTED
Cannabinoid 50 Ng, Ur ~~LOC~~: NOT DETECTED
MDMA (Ecstasy)Ur Screen: NOT DETECTED
Methadone Scn, Ur: NOT DETECTED
OPIATE, UR SCREEN: NOT DETECTED
PHENCYCLIDINE (PCP) UR S: NOT DETECTED
Tricyclic, Ur Screen: NOT DETECTED

## 2016-04-24 LAB — CBC
HEMATOCRIT: 30.8 % — AB (ref 35.0–47.0)
Hemoglobin: 10.1 g/dL — ABNORMAL LOW (ref 12.0–16.0)
MCH: 29 pg (ref 26.0–34.0)
MCHC: 32.7 g/dL (ref 32.0–36.0)
MCV: 88.9 fL (ref 80.0–100.0)
PLATELETS: 242 10*3/uL (ref 150–440)
RBC: 3.46 MIL/uL — ABNORMAL LOW (ref 3.80–5.20)
RDW: 21.7 % — AB (ref 11.5–14.5)
WBC: 8.7 10*3/uL (ref 3.6–11.0)

## 2016-04-24 LAB — BASIC METABOLIC PANEL
Anion gap: 13 (ref 5–15)
BUN: 55 mg/dL — AB (ref 6–20)
CHLORIDE: 103 mmol/L (ref 101–111)
CO2: 25 mmol/L (ref 22–32)
CREATININE: 11.47 mg/dL — AB (ref 0.44–1.00)
Calcium: 8.2 mg/dL — ABNORMAL LOW (ref 8.9–10.3)
GFR calc Af Amer: 4 mL/min — ABNORMAL LOW (ref >60)
GFR calc non Af Amer: 3 mL/min — ABNORMAL LOW (ref >60)
GLUCOSE: 115 mg/dL — AB (ref 65–99)
POTASSIUM: 4.6 mmol/L (ref 3.5–5.1)
Sodium: 141 mmol/L (ref 135–145)

## 2016-04-24 LAB — BRAIN NATRIURETIC PEPTIDE: B Natriuretic Peptide: 1249 pg/mL — ABNORMAL HIGH (ref 0.0–100.0)

## 2016-04-24 LAB — TROPONIN I
Troponin I: 0.06 ng/mL (ref <0.03)
Troponin I: 0.05 ng/mL (ref <0.03)

## 2016-04-24 MED ORDER — METHYLPREDNISOLONE SODIUM SUCC 125 MG IJ SOLR
125.0000 mg | Freq: Once | INTRAMUSCULAR | Status: AC
  Administered 2016-04-24: 125 mg via INTRAVENOUS
  Filled 2016-04-24: qty 2

## 2016-04-24 MED ORDER — PREDNISONE 20 MG PO TABS
60.0000 mg | ORAL_TABLET | Freq: Every day | ORAL | 0 refills | Status: DC
Start: 2016-04-24 — End: 2016-04-28

## 2016-04-24 MED ORDER — ALBUTEROL SULFATE HFA 108 (90 BASE) MCG/ACT IN AERS: 2.0000 | INHALATION_SPRAY | Freq: Four times a day (QID) | RESPIRATORY_TRACT | 2 refills | Status: DC | PRN

## 2016-04-24 MED ORDER — IPRATROPIUM-ALBUTEROL 0.5-2.5 (3) MG/3ML IN SOLN
3.0000 mL | Freq: Once | RESPIRATORY_TRACT | Status: AC
  Administered 2016-04-24: 3 mL via RESPIRATORY_TRACT
  Filled 2016-04-24: qty 3

## 2016-04-24 NOTE — ED Notes (Signed)
Pt placed 2L Bernie.

## 2016-04-24 NOTE — ED Provider Notes (Signed)
Patient received in signout from Dr. Owens Shark. History of end-stage renal disease on dialysis as well as COPD. Patient was significant improvement after steroids and breathing treatments. Currently requesting discharge so she can go to dialysis. BMP mildly elevated she has mild CHF on chest x-ray. Likely secondary to volume overload which will be fixed with dialysis today. Will ambulate patient for hypoxia trial.  ----------------------------------------- 10:25 AM on 04/24/2016 -----------------------------------------  Patient with chronically elevated troponin.  She is able to ambulate without any hypoxia, shortness of breath or chest pain No evidence of acute ACS. Patient had a clean catheter in August. Patient stable for discharge to dialysis. Discussed signs and symptoms for which the patient to return immediately to the hospital.     Merlyn Lot, MD 04/24/16 1026

## 2016-04-24 NOTE — Discharge Instructions (Signed)
Please go directly to dialysis. Return to the hospital should he have any worsening shortness of breath, chest pain or discomfort.

## 2016-04-24 NOTE — ED Provider Notes (Signed)
[facility] Emergency Department Provider Note    First MD Initiated Contact with Patient 04/24/16 [phone]     (approximate)  I have reviewed the triage vital signs and the nursing notes.   HISTORY  Chief Complaint Shortness of Breath    HPI Jasmine Holloway is a 55 y.o. female with history of COPD, CHF diabetes end-stage renal hypertension presents with progressive dyspnea 3 days with acute worsening this morning.Patient states while outside with her partner who was smoking her dyspnea acutely worsened. Patient admits to productive cough however denies any fever or febrile on presentation temperature 98.2. Patient states that dyspnea not improvement home breathing treatments. Patient denies any chest pain   Past Medical History:  Diagnosis Date  . Asthma   . COPD (chronic obstructive pulmonary disease) ([place])   . Diabetes mellitus without complication ([place])   . Diastolic heart failure ([place])   . ESRD (end stage renal disease) on dialysis ([place])   . Hypertension   . Neuropathy ([place])   . Polycystic kidney disease   . Renal insufficiency     Patient Active Problem List   Diagnosis Date Noted  . Epigastric pain 03/28/2016  . Acute CHF (congestive heart failure) ([place]) 03/28/2016  . CHF (congestive heart failure) ([place]) 03/28/2016  . Unstable angina ([place]) 03/17/2016  . Chronic diastolic heart failure ([place]) 02/04/2016  . Hoarseness of voice 02/04/2016  . Acute on chronic diastolic CHF (congestive heart failure) ([place]) 12/22/2015  . ESRD on dialysis ([place]) 12/22/2015  . Benign essential HTN 12/22/2015  . Hyperlipidemia 12/22/2015    Past Surgical History:  Procedure Laterality Date  . ARTERIOVENOUS GRAFT PLACEMENT Right    x3 (R forearm currently used for access)  . COLON SURGERY    . VEIN HARVEST      Prior to Admission medications   Medication Sig Start Date End Date Taking? Authorizing Provider  acetaminophen (TYLENOL) 325 MG tablet Take 2 tablets  (650 mg total) by mouth every 6 (six) hours as needed for mild pain or headache. 03/18/16   Nicholes Mango, MD  albuterol (PROVENTIL HFA;VENTOLIN HFA) 108 (90 Base) MCG/ACT inhaler Inhale 2 puffs into the lungs every 6 (six) hours as needed for wheezing or shortness of breath. 03/05/16 06/03/16  Alisa Graff, FNP  amLODipine (NORVASC) 10 MG tablet Take 1 tablet (10 mg total) by mouth at bedtime. Patient taking differently: Take 5-10 mg by mouth See admin instructions. Take 5 mg by mouth every morning, and take 10 mg by mouth every night at bedtime. 01/17/16   Gladstone Lighter, MD  aspirin 81 MG chewable tablet Chew 1 tablet (81 mg total) by mouth daily. 01/17/16   Gladstone Lighter, MD  b complex-vitamin c-folic acid (NEPHRO-VITE) 0.8 MG TABS tablet Take 1 tablet by mouth at bedtime.    Historical Provider, MD  calcium acetate (PHOSLO) 667 MG capsule Take 667 mg by mouth 3 (three) times daily with meals.     Historical Provider, MD  cinacalcet (SENSIPAR) 30 MG tablet Take 30 mg by mouth daily.    Historical Provider, MD  cloNIDine (CATAPRES) 0.1 MG tablet Take 1 tablet (0.1 mg total) by mouth 2 (two) times daily. 01/17/16   Gladstone Lighter, MD  furosemide (LASIX) 80 MG tablet Take 1 tablet (80 mg total) by mouth daily. 03/05/16 06/03/16  Alisa Graff, FNP  gabapentin (NEURONTIN) 300 MG capsule Take 300 mg by mouth 2 (two) times daily.     Historical Provider, MD  irbesartan Levy Sjogren)  150 MG tablet Take 1 tablet (150 mg total) by mouth at bedtime. 01/17/16   Gladstone Lighter, MD  metoprolol succinate (TOPROL-XL) 50 MG 24 hr tablet Take 50 mg by mouth daily. Take with or immediately following a meal.    Historical Provider, MD  omeprazole (PRILOSEC) 20 MG capsule Take 40 mg by mouth daily.     Historical Provider, MD  oxyCODONE-acetaminophen (PERCOCET) 10-325 MG tablet Take 0.5-1 tablets by mouth daily as needed for pain. 03/18/16   Historical Provider, MD  ranitidine (ZANTAC) 150 MG tablet Take 150 mg by mouth 2  (two) times daily. 01/28/16   Historical Provider, MD  rosuvastatin (CRESTOR) 5 MG tablet Take 5 mg by mouth every morning.     Historical Provider, MD    Allergies Morphine and related; Vicodin [hydrocodone-acetaminophen]; Buprenorphine hcl; and Codeine  Family History  Problem Relation Age of Onset  . Hypertension Brother   . Heart failure Brother     Social History Social History  Substance Use Topics  . Smoking status: Former Smoker    Packs/day: 0.25    Quit date: 08/11/2015  . Smokeless tobacco: Never Used  . Alcohol use No    Review of Systems Constitutional: No fever/chills Eyes: No visual changes. ENT: No sore throat. Cardiovascular: Denies chest pain. Respiratory: Positive for dyspnea, cough, wheezing Gastrointestinal: No abdominal pain.  No nausea, no vomiting.  No diarrhea.  No constipation. Genitourinary: Negative for dysuria. Musculoskeletal: Negative for back pain. Skin: Negative for rash. Neurological: Negative for headaches, focal weakness or numbness.  10-point ROS otherwise negative.  ____________________________________________   PHYSICAL EXAM:  VITAL SIGNS: ED Triage Vitals  Enc Vitals Group     BP 04/24/16 0641 (!) 177/107     Pulse Rate 04/24/16 0641 87     Resp 04/24/16 0641 16     Temp 04/24/16 0641 98.2 F (36.8 C)     Temp src --      SpO2 04/24/16 0638 100 %     Weight 04/24/16 0645 180 lb (81.6 kg)     Height 04/24/16 0645 5\' 3"  (1.6 m)     Head Circumference --      Peak Flow --      Pain Score --      Pain Loc --      Pain Edu? --      Excl. in [place]? --     Constitutional: Alert and oriented. Apparent rest or distress Eyes: Conjunctivae are normal. PERRL. EOMI. Head: Atraumatic. Mouth/Throat: Mucous membranes are moist.  Oropharynx non-erythematous. Neck: No stridor.  No meningeal signs.   Cardiovascular: Normal rate, regular rhythm. Good peripheral circulation. Grossly normal heart sounds. Respiratory: Diffuse x-ray  wheezes, tachypnea, accessory respiratory muscle use Gastrointestinal: Soft and nontender. No distention.  Musculoskeletal: No lower extremity tenderness nor edema. No gross deformities of extremities. Neurologic:  Normal speech and language. No gross focal neurologic deficits are appreciated.  Skin:  Skin is warm, dry and intact. No rash noted. Psychiatric: Mood and affect are normal. Speech and behavior are normal.  ____________________________________________   LABS (all labs ordered are listed, but only abnormal results are displayed)  Labs Reviewed  BASIC METABOLIC PANEL  CBC  TROPONIN I  BRAIN NATRIURETIC PEPTIDE   ____________________________________________  EKG  ED ECG REPORT I, [place] N Matthewjames Petrasek, the attending physician, personally viewed and interpreted this ECG.   Date: 04/24/2016  EKG Time: 6:47 AM  Rate: 85  Rhythm: Normal sinus rhythm  Axis: Normal  Intervals:  Normal  ST&T Change: None  ____________________________________________  RADIOLOGY I, [place] N Cayne Yom, personally viewed and evaluated these images (plain radiographs) as part of my medical decision making, as well as reviewing the written report by the radiologist.    Procedures   Critical Care performed:CRITICAL CARE Performed by: [facility] N Kouper Spinella   Total critical care time: 30 minutes  Critical care time was exclusive of separately billable procedures and treating other patients.  Critical care was necessary to treat or prevent imminent or life-threatening deterioration.  Critical care was time spent personally by me on the following activities: development of treatment plan with patient and/or surrogate as well as nursing, discussions with consultants, evaluation of patient's response to treatment, examination of patient, obtaining history from patient or surrogate, ordering and performing treatments and interventions, ordering and review of laboratory studies, ordering and review of  radiographic studies, pulse oximetry and re-evaluation of patient's condition.  ____________________________________________   INITIAL IMPRESSION / ASSESSMENT AND PLAN / ED COURSE  Pertinent labs & imaging results that were available during my care of the patient were reviewed by me and considered in my medical decision making (see chart for details).  Patient given 2 DuoNeb's in route from EMS and an additional DuoNeb here in the emergency department as well as IV Solu-Medrol 125 mg. Patient's care transferred to Dr. Quentin Cornwall awaiting laboratory data and chest x-ray.   Clinical Course    ____________________________________________  FINAL CLINICAL IMPRESSION(S) / ED DIAGNOSES  Final diagnoses:  COPD exacerbation ([place])  Respiratory distress     MEDICATIONS GIVEN DURING THIS VISIT:  Medications  ipratropium-albuterol (DUONEB) 0.5-2.5 (3) MG/3ML nebulizer solution 3 mL (not administered)  methylPREDNISolone sodium succinate (SOLU-MEDROL) 125 mg/2 mL injection 125 mg (not administered)     NEW OUTPATIENT MEDICATIONS STARTED DURING THIS VISIT:  New Prescriptions   No medications on file    Modified Medications   No medications on file    Discontinued Medications   No medications on file     Note:  This document was prepared using Dragon voice recognition software and may include unintentional dictation errors.    Gregor Hams, MD 04/24/16 [phone]

## 2016-04-24 NOTE — ED Notes (Signed)
Pt dropped down to 91% r/a when ambulating. MD notified.

## 2016-04-24 NOTE — ED Triage Notes (Signed)
Pt arrived via EMS from home c/o SOB that has gotten progressively worse for 3 days. Pt tried to use inhaler at home with no relief. Pt had bilateral wheezing when EMS arrived; pt gave EMS duoneb and albuterol treatment. Pt reports that she has been coughing a lot lately and bringing up sputum. Pt arrived with a lot of coughing in ER and audible expiratory wheezing. Pt is dialysis pt and is to have dialysis today. Pt has hx of HTN, CHF, asthma.

## 2016-04-28 ENCOUNTER — Encounter: Payer: Self-pay | Admitting: Emergency Medicine

## 2016-04-28 ENCOUNTER — Inpatient Hospital Stay
Admission: EM | Admit: 2016-04-28 | Discharge: 2016-04-30 | DRG: 291 | Disposition: A | Discharge status: Home / Self Care | Payer: Medicare Other | Attending: Internal Medicine | Admitting: Internal Medicine

## 2016-04-28 ENCOUNTER — Emergency Department: Payer: Medicare Other

## 2016-04-28 DIAGNOSIS — Z7982 Long term (current) use of aspirin: Secondary | ICD-10-CM

## 2016-04-28 DIAGNOSIS — E875 Hyperkalemia: Secondary | ICD-10-CM | POA: Diagnosis present

## 2016-04-28 DIAGNOSIS — Q613 Polycystic kidney, unspecified: Secondary | ICD-10-CM

## 2016-04-28 DIAGNOSIS — Z992 Dependence on renal dialysis: Secondary | ICD-10-CM

## 2016-04-28 DIAGNOSIS — J9601 Acute respiratory failure with hypoxia: Secondary | ICD-10-CM | POA: Diagnosis present

## 2016-04-28 DIAGNOSIS — D631 Anemia in chronic kidney disease: Secondary | ICD-10-CM | POA: Diagnosis present

## 2016-04-28 DIAGNOSIS — Z7952 Long term (current) use of systemic steroids: Secondary | ICD-10-CM | POA: Diagnosis not present

## 2016-04-28 DIAGNOSIS — I509 Heart failure, unspecified: Secondary | ICD-10-CM

## 2016-04-28 DIAGNOSIS — J810 Acute pulmonary edema: Secondary | ICD-10-CM

## 2016-04-28 DIAGNOSIS — I5032 Chronic diastolic (congestive) heart failure: Secondary | ICD-10-CM | POA: Diagnosis present

## 2016-04-28 DIAGNOSIS — Z87891 Personal history of nicotine dependence: Secondary | ICD-10-CM | POA: Diagnosis not present

## 2016-04-28 DIAGNOSIS — K219 Gastro-esophageal reflux disease without esophagitis: Secondary | ICD-10-CM | POA: Diagnosis present

## 2016-04-28 DIAGNOSIS — J441 Chronic obstructive pulmonary disease with (acute) exacerbation: Secondary | ICD-10-CM

## 2016-04-28 DIAGNOSIS — I132 Hypertensive heart and chronic kidney disease with heart failure and with stage 5 chronic kidney disease, or end stage renal disease: Principal | ICD-10-CM | POA: Diagnosis present

## 2016-04-28 DIAGNOSIS — N186 End stage renal disease: Secondary | ICD-10-CM | POA: Diagnosis present

## 2016-04-28 DIAGNOSIS — E1140 Type 2 diabetes mellitus with diabetic neuropathy, unspecified: Secondary | ICD-10-CM | POA: Diagnosis present

## 2016-04-28 DIAGNOSIS — I248 Other forms of acute ischemic heart disease: Secondary | ICD-10-CM | POA: Diagnosis present

## 2016-04-28 DIAGNOSIS — Z79899 Other long term (current) drug therapy: Secondary | ICD-10-CM | POA: Diagnosis not present

## 2016-04-28 DIAGNOSIS — M7989 Other specified soft tissue disorders: Secondary | ICD-10-CM

## 2016-04-28 DIAGNOSIS — E1122 Type 2 diabetes mellitus with diabetic chronic kidney disease: Secondary | ICD-10-CM | POA: Diagnosis present

## 2016-04-28 DIAGNOSIS — Z8249 Family history of ischemic heart disease and other diseases of the circulatory system: Secondary | ICD-10-CM

## 2016-04-28 LAB — COMPREHENSIVE METABOLIC PANEL
ALT: 26 U/L (ref 14–54)
AST: 23 U/L (ref 15–41)
Albumin: 3.5 g/dL (ref 3.5–5.0)
Alkaline Phosphatase: 54 U/L (ref 38–126)
Anion gap: 13 (ref 5–15)
BUN: 25 mg/dL — AB (ref 6–20)
CHLORIDE: 98 mmol/L — AB (ref 101–111)
CO2: 29 mmol/L (ref 22–32)
Calcium: 8.2 mg/dL — ABNORMAL LOW (ref 8.9–10.3)
Creatinine, Ser: 6.31 mg/dL — ABNORMAL HIGH (ref 0.44–1.00)
GFR calc Af Amer: 8 mL/min — ABNORMAL LOW (ref >60)
GFR, EST NON AFRICAN AMERICAN: 7 mL/min — AB (ref >60)
Glucose, Bld: 80 mg/dL (ref 65–99)
POTASSIUM: 3.7 mmol/L (ref 3.5–5.1)
SODIUM: 140 mmol/L (ref 135–145)
Total Bilirubin: 0.9 mg/dL (ref 0.3–1.2)
Total Protein: 7.4 g/dL (ref 6.5–8.1)

## 2016-04-28 LAB — CBC
HEMATOCRIT: 29.5 % — AB (ref 35.0–47.0)
HEMOGLOBIN: 9.8 g/dL — AB (ref 12.0–16.0)
MCH: 28.3 pg (ref 26.0–34.0)
MCHC: 33.1 g/dL (ref 32.0–36.0)
MCV: 85.5 fL (ref 80.0–100.0)
PLATELETS: 309 10*3/uL (ref 150–440)
RBC: 3.45 MIL/uL — AB (ref 3.80–5.20)
RDW: 22 % — AB (ref 11.5–14.5)
WBC: 7.6 10*3/uL (ref 3.6–11.0)

## 2016-04-28 LAB — TROPONIN I: TROPONIN I: 0.07 ng/mL — AB (ref <0.03)

## 2016-04-28 MED ORDER — ONDANSETRON HCL 4 MG/2ML IJ SOLN
4.0000 mg | Freq: Once | INTRAMUSCULAR | Status: AC
Start: 2016-04-28 — End: 2016-04-28
  Administered 2016-04-28: 4 mg via INTRAVENOUS
  Filled 2016-04-28: qty 2

## 2016-04-28 MED ORDER — GABAPENTIN 300 MG PO CAPS
300.0000 mg | ORAL_CAPSULE | Freq: Two times a day (BID) | ORAL | Status: DC
  Administered 2016-04-28 – 2016-04-30 (×4): 300 mg via ORAL
  Filled 2016-04-28 (×4): qty 1

## 2016-04-28 MED ORDER — METOPROLOL SUCCINATE ER 50 MG PO TB24
50.0000 mg | ORAL_TABLET | Freq: Every day | ORAL | Status: DC
  Administered 2016-04-28 – 2016-04-30 (×3): 50 mg via ORAL
  Filled 2016-04-28 (×3): qty 1

## 2016-04-28 MED ORDER — FUROSEMIDE 10 MG/ML IJ SOLN: 80.0000 mg | Freq: Once | INTRAMUSCULAR | Status: DC

## 2016-04-28 MED ORDER — METHYLPREDNISOLONE SODIUM SUCC 40 MG IJ SOLR
40.0000 mg | Freq: Two times a day (BID) | INTRAMUSCULAR | Status: DC
  Administered 2016-04-29 – 2016-04-30 (×3): 40 mg via INTRAVENOUS
  Filled 2016-04-28 (×3): qty 1

## 2016-04-28 MED ORDER — METHYLPREDNISOLONE SODIUM SUCC 125 MG IJ SOLR
125.0000 mg | Freq: Once | INTRAMUSCULAR | Status: AC
  Administered 2016-04-28: 125 mg via INTRAVENOUS
  Filled 2016-04-28: qty 2

## 2016-04-28 MED ORDER — FAMOTIDINE 20 MG PO TABS
20.0000 mg | ORAL_TABLET | ORAL | Status: DC
  Administered 2016-04-28: 20 mg via ORAL
  Filled 2016-04-28: qty 1

## 2016-04-28 MED ORDER — IPRATROPIUM-ALBUTEROL 0.5-2.5 (3) MG/3ML IN SOLN
3.0000 mL | Freq: Once | RESPIRATORY_TRACT | Status: AC
  Administered 2016-04-28: 3 mL via RESPIRATORY_TRACT
  Filled 2016-04-28: qty 3

## 2016-04-28 MED ORDER — FUROSEMIDE 40 MG PO TABS
80.0000 mg | ORAL_TABLET | Freq: Every day | ORAL | Status: DC
  Administered 2016-04-28 – 2016-04-30 (×3): 80 mg via ORAL
  Filled 2016-04-28 (×3): qty 2

## 2016-04-28 MED ORDER — ROSUVASTATIN CALCIUM 10 MG PO TABS
5.0000 mg | ORAL_TABLET | ORAL | Status: DC
  Administered 2016-04-29 – 2016-04-30 (×2): 5 mg via ORAL
  Filled 2016-04-28 (×2): qty 1

## 2016-04-28 MED ORDER — IPRATROPIUM-ALBUTEROL 0.5-2.5 (3) MG/3ML IN SOLN
3.0000 mL | RESPIRATORY_TRACT | Status: DC
  Administered 2016-04-28 – 2016-04-29 (×4): 3 mL via RESPIRATORY_TRACT
  Filled 2016-04-28 (×4): qty 3

## 2016-04-28 MED ORDER — HYDRALAZINE HCL 20 MG/ML IJ SOLN: 10.0000 mg | Freq: Four times a day (QID) | INTRAMUSCULAR | Status: DC | PRN

## 2016-04-28 MED ORDER — ACETAMINOPHEN 325 MG PO TABS: 650.0000 mg | ORAL_TABLET | Freq: Four times a day (QID) | ORAL | Status: DC | PRN

## 2016-04-28 MED ORDER — ONDANSETRON HCL 4 MG PO TABS: 4.0000 mg | ORAL_TABLET | Freq: Four times a day (QID) | ORAL | Status: DC | PRN

## 2016-04-28 MED ORDER — ACETAMINOPHEN 650 MG RE SUPP: 650.0000 mg | Freq: Four times a day (QID) | RECTAL | Status: DC | PRN

## 2016-04-28 MED ORDER — ONDANSETRON HCL 4 MG/2ML IJ SOLN: 4.0000 mg | Freq: Four times a day (QID) | INTRAMUSCULAR | Status: DC | PRN

## 2016-04-28 MED ORDER — ONDANSETRON 4 MG PO TBDP
4.0000 mg | ORAL_TABLET | Freq: Once | ORAL | Status: DC
  Filled 2016-04-28: qty 1

## 2016-04-28 MED ORDER — IRBESARTAN 75 MG PO TABS
150.0000 mg | ORAL_TABLET | Freq: Every day | ORAL | Status: DC
  Administered 2016-04-28: 150 mg via ORAL
  Filled 2016-04-28: qty 2

## 2016-04-28 MED ORDER — CALCIUM ACETATE (PHOS BINDER) 667 MG PO CAPS
667.0000 mg | ORAL_CAPSULE | Freq: Three times a day (TID) | ORAL | Status: DC
  Administered 2016-04-29 – 2016-04-30 (×4): 667 mg via ORAL
  Filled 2016-04-28 (×6): qty 1

## 2016-04-28 MED ORDER — ALBUTEROL SULFATE (2.5 MG/3ML) 0.083% IN NEBU
2.5000 mg | INHALATION_SOLUTION | Freq: Four times a day (QID) | RESPIRATORY_TRACT | Status: DC | PRN
Start: 2016-04-28 — End: 2016-04-30

## 2016-04-28 MED ORDER — FAMOTIDINE 20 MG PO TABS: 20.0000 mg | ORAL_TABLET | Freq: Two times a day (BID) | ORAL | Status: DC

## 2016-04-28 MED ORDER — CLONIDINE HCL 0.1 MG PO TABS
0.1000 mg | ORAL_TABLET | Freq: Two times a day (BID) | ORAL | Status: DC
  Administered 2016-04-28 – 2016-04-29 (×3): 0.1 mg via ORAL
  Filled 2016-04-28 (×4): qty 1

## 2016-04-28 MED ORDER — PANTOPRAZOLE SODIUM 40 MG PO TBEC
40.0000 mg | DELAYED_RELEASE_TABLET | Freq: Every day | ORAL | Status: DC
  Administered 2016-04-28 – 2016-04-29 (×2): 40 mg via ORAL
  Filled 2016-04-28 (×2): qty 1

## 2016-04-28 MED ORDER — ALBUTEROL SULFATE HFA 108 (90 BASE) MCG/ACT IN AERS: 2.0000 | INHALATION_SPRAY | Freq: Four times a day (QID) | RESPIRATORY_TRACT | Status: DC | PRN

## 2016-04-28 MED ORDER — OXYCODONE-ACETAMINOPHEN 10-325 MG PO TABS
1.0000 | ORAL_TABLET | Freq: Four times a day (QID) | ORAL | Status: DC | PRN
  Filled 2016-04-28: qty 1

## 2016-04-28 MED ORDER — SODIUM CHLORIDE 0.9% FLUSH
3.0000 mL | Freq: Two times a day (BID) | INTRAVENOUS | Status: DC
  Administered 2016-04-28 – 2016-04-30 (×4): 3 mL via INTRAVENOUS

## 2016-04-28 MED ORDER — AMLODIPINE BESYLATE 10 MG PO TABS
10.0000 mg | ORAL_TABLET | Freq: Every day | ORAL | Status: DC
  Administered 2016-04-28 – 2016-04-29 (×2): 10 mg via ORAL
  Filled 2016-04-28 (×2): qty 1

## 2016-04-28 MED ORDER — HEPARIN SODIUM (PORCINE) 5000 UNIT/ML IJ SOLN
5000.0000 [IU] | Freq: Three times a day (TID) | INTRAMUSCULAR | Status: DC
  Administered 2016-04-28 – 2016-04-29 (×4): 5000 [IU] via SUBCUTANEOUS
  Filled 2016-04-28 (×4): qty 1

## 2016-04-28 MED ORDER — ASPIRIN 81 MG PO CHEW
81.0000 mg | CHEWABLE_TABLET | Freq: Every day | ORAL | Status: DC
  Administered 2016-04-28 – 2016-04-30 (×3): 81 mg via ORAL
  Filled 2016-04-28 (×4): qty 1

## 2016-04-28 MED ORDER — RENA-VITE PO TABS
1.0000 | ORAL_TABLET | Freq: Every day | ORAL | Status: DC
  Administered 2016-04-28 – 2016-04-29 (×2): 1 via ORAL
  Filled 2016-04-28 (×2): qty 1

## 2016-04-28 NOTE — H&P (Signed)
[place] at [place] NAME: Jasmine Holloway    MR#:  [id number]  DATE OF BIRTH:  08-20-1960  DATE OF ADMISSION:  04/28/2016  PRIMARY CARE PHYSICIAN: Elyse Jarvis, MD   REQUESTING/REFERRING PHYSICIAN: Dr. Stormy Fabian  CHIEF COMPLAINT:   Chief Complaint  Patient presents with  . Shortness of Breath    HISTORY OF PRESENT ILLNESS:  Jasmine Holloway  is a 55 y.o. female with a known history of COPD not on home oxygen, diabetes mellitus, diastolic CHF, end-stage renal disease on hemodialysis, hypertension presents from dialysis center secondary to chest pain and difficulty breathing. Patient is very sleepy during my interview, but she is easily arousable and oriented. She states that for the past couple days she has been sick at home with nausea, vomiting, difficulty breathing and cough. She finished her entire dialysis session today, however was asked to get more fluid off as she was fluid overloaded. At the time she started complaining of chest pressure and worsening of her breathing and so brought to the emergency room. Troponin is slightly elevated, she was having coarse expiratory wheezing all over her lung fields when she was brought in. Chest x-ray shows pulmonary vascular congestion. She is hypoxic requiring 2 L oxygen at this time.  PAST MEDICAL HISTORY:   Past Medical History:  Diagnosis Date  . Asthma   . COPD (chronic obstructive pulmonary disease) ([place])   . Diabetes mellitus without complication ([place])   . Diastolic heart failure ([place])   . ESRD (end stage renal disease) on dialysis ([place])   . Hypertension   . Neuropathy ([place])   . Polycystic kidney disease   . Renal insufficiency     PAST SURGICAL HISTORY:   Past Surgical History:  Procedure Laterality Date  . ARTERIOVENOUS GRAFT PLACEMENT Right    x3 (R forearm currently used for access)  . COLON SURGERY    . VEIN HARVEST      SOCIAL HISTORY:   Social History    Substance Use Topics  . Smoking status: Former Smoker    Packs/day: 0.25    Quit date: 08/11/2015  . Smokeless tobacco: Never Used  . Alcohol use No    FAMILY HISTORY:   Family History  Problem Relation Age of Onset  . Hypertension Brother   . Heart failure Brother     DRUG ALLERGIES:   Allergies  Allergen Reactions  . Morphine And Related Nausea And Vomiting  . Vicodin [Hydrocodone-Acetaminophen] Nausea And Vomiting  . Buprenorphine Hcl Nausea And Vomiting  . Codeine Nausea And Vomiting    REVIEW OF SYSTEMS:   Review of Systems  Constitutional: Positive for chills and malaise/fatigue. Negative for fever and weight loss.  HENT: Negative for ear discharge, ear pain, hearing loss, nosebleeds and tinnitus.   Eyes: Negative for blurred vision, double vision and photophobia.  Respiratory: Positive for cough and shortness of breath. Negative for hemoptysis and wheezing.   Cardiovascular: Positive for chest pain and leg swelling. Negative for palpitations and orthopnea.  Gastrointestinal: Positive for nausea and vomiting. Negative for abdominal pain, constipation, diarrhea, heartburn and melena.  Genitourinary: Negative for dysuria, frequency, hematuria and urgency.  Musculoskeletal: Negative for back pain, myalgias and neck pain.  Skin: Negative for rash.  Neurological: Negative for dizziness, tingling, tremors, sensory change, speech change, focal weakness and headaches.  Endo/Heme/Allergies: Does not bruise/bleed easily.  Psychiatric/Behavioral: Negative for depression.    MEDICATIONS AT HOME:   Prior to Admission medications  Medication Sig Start Date End Date Taking? Authorizing Provider  acetaminophen (TYLENOL) 325 MG tablet Take 2 tablets (650 mg total) by mouth every 6 (six) hours as needed for mild pain or headache. 03/18/16   Nicholes Mango, MD  albuterol (PROVENTIL HFA;VENTOLIN HFA) 108 (90 Base) MCG/ACT inhaler Inhale 2 puffs into the lungs every 6 (six) hours as  needed for wheezing or shortness of breath. 03/05/16 06/03/16  Alisa Graff, FNP  albuterol (PROVENTIL HFA;VENTOLIN HFA) 108 (90 Base) MCG/ACT inhaler Inhale 2 puffs into the lungs every 6 (six) hours as needed for wheezing or shortness of breath. 04/24/16   Merlyn Lot, MD  amLODipine (NORVASC) 10 MG tablet Take 1 tablet (10 mg total) by mouth at bedtime. Patient taking differently: Take 5-10 mg by mouth 2 (two) times daily. 5 mg every morning and 10 mg at bedtime 01/17/16   Gladstone Lighter, MD  aspirin 81 MG chewable tablet Chew 1 tablet (81 mg total) by mouth daily. 01/17/16   Gladstone Lighter, MD  b complex-vitamin c-folic acid (NEPHRO-VITE) 0.8 MG TABS tablet Take 1 tablet by mouth at bedtime.    Historical Provider, MD  calcium acetate (PHOSLO) 667 MG capsule Take 667 mg by mouth 3 (three) times daily with meals.     Historical Provider, MD  cloNIDine (CATAPRES) 0.1 MG tablet Take 1 tablet (0.1 mg total) by mouth 2 (two) times daily. 01/17/16   Gladstone Lighter, MD  furosemide (LASIX) 80 MG tablet Take 1 tablet (80 mg total) by mouth daily. 03/05/16 06/03/16  Alisa Graff, FNP  gabapentin (NEURONTIN) 300 MG capsule Take 300 mg by mouth 2 (two) times daily.     Historical Provider, MD  irbesartan (AVAPRO) 150 MG tablet Take 1 tablet (150 mg total) by mouth at bedtime. 01/17/16   Gladstone Lighter, MD  metoprolol succinate (TOPROL-XL) 50 MG 24 hr tablet Take 50 mg by mouth daily.     Historical Provider, MD  omeprazole (PRILOSEC) 20 MG capsule Take 20 mg by mouth 2 (two) times daily.     Historical Provider, MD  oxyCODONE-acetaminophen (PERCOCET) 10-325 MG tablet Take 0.5-1 tablets by mouth daily as needed for pain. 03/18/16   Historical Provider, MD  predniSONE (DELTASONE) 20 MG tablet Take 3 tablets (60 mg total) by mouth daily. 04/24/16 04/29/16  Merlyn Lot, MD  ranitidine (ZANTAC) 150 MG tablet Take 150 mg by mouth 2 (two) times daily. 01/28/16   Historical Provider, MD  rosuvastatin  (CRESTOR) 5 MG tablet Take 5 mg by mouth every morning.     Historical Provider, MD      VITAL SIGNS:  Blood pressure (!) 175/97, pulse 92, temperature 97.8 F (36.6 C), temperature source Oral, resp. rate 20, height 5\' 3"  (1.6 m), weight 81.6 kg (180 lb), SpO2 93 %.  PHYSICAL EXAMINATION:   Physical Exam  GENERAL:  54 y.o.-year-old patient lying in the bed with no acute distress. Very sleepy. EYES: Pupils equal, round, reactive to light and accommodation. No scleral icterus. Extraocular muscles intact.  HEENT: Head atraumatic, normocephalic. Oropharynx and nasopharynx clear.  NECK:  Supple, no jugular venous distention. No thyroid enlargement, no tenderness.  LUNGS: coarse expiratory wheezing all over the lung fields, no rales,rhonchi or crepitation. No use of accessory muscles of respiration.  CARDIOVASCULAR: S1, S2 normal. No murmurs, rubs, or gallops.  ABDOMEN: Soft, nontender, nondistended. Bowel sounds present. No organomegaly or mass.  EXTREMITIES: No cyanosis, or clubbing. 2+ pedal edema noted Right fore arm AV fistula present. NEUROLOGIC: Sleepy,  arousable, unable to cooperate for a complete neuro exam, no cranial nerve deficits, no new focal motor or sensory deficits seen,.Gait not checked.  PSYCHIATRIC: The patient is sleepy, easily arousable and oriented x 3.  SKIN: No obvious rash, lesion, or ulcer.   LABORATORY PANEL:   CBC  Recent Labs Lab 04/28/16 1640  WBC 7.6  HGB 9.8*  HCT 29.5*  PLT 309   ------------------------------------------------------------------------------------------------------------------  Chemistries   Recent Labs Lab 04/28/16 1640  NA 140  K 3.7  CL 98*  CO2 29  GLUCOSE 80  BUN 25*  CREATININE 6.31*  CALCIUM 8.2*  AST 23  ALT 26  ALKPHOS 54  BILITOT 0.9   ------------------------------------------------------------------------------------------------------------------  Cardiac Enzymes  Recent Labs Lab 04/28/16 1640    TROPONINI 0.07*   ------------------------------------------------------------------------------------------------------------------  RADIOLOGY:  Dg Chest 2 View  Result Date: 04/28/2016 CLINICAL DATA:  Difficulty breathing last night, worse today, history of end-stage renal disease and diabetes EXAM: CHEST  2 VIEW COMPARISON:  Portable chest x-ray of 04/24/2016 FINDINGS: There is moderate cardiomegaly present. There is pulmonary vascular congestion with perhaps tiny pleural effusions, consistent with congestive heart failure. No focal pneumonia is seen. No bony abnormality is seen. IMPRESSION: CHF. Electronically Signed   By: Ivar Drape M.D.   On: 04/28/2016 15:57    EKG:   Orders placed or performed during the hospital encounter of 04/24/16  . ED EKG  . ED EKG  . EKG 12-Lead  . EKG 12-Lead  . EKG    IMPRESSION AND PLAN:   Cambryn Charters  is a 55 y.o. female with a known history of COPD not on home oxygen, diabetes mellitus, diastolic CHF, end-stage renal disease on hemodialysis, hypertension presents from dialysis center secondary to chest pain and difficulty breathing.  #1 Acute hypoxic respiratory failure- COPD and CHF exacerbation - started on nebs and IV steroids, no indication for ABX at this time - continue o2 support and wean as tolerated - ECHO with LVH and diastolic dysfunction, will need additional dialysis session - IV lasix once and continue oral lasix   #2 elevated troponin-demand ischemia and poor renal clearance. -Patient just had a cardiac catheterization in August 2017 and showed normal coronaries  #3 HTN- continue home meds - also IV hydralazine PRN  #4 ESRD on HD- T-Th-S schedule, had dialysis today, might need another extra session as fluid overloaded and CXR with pulm congestion - nephrology consulted  #5 DVT Prophylaxis- heparin SQ    All the records are reviewed and case discussed with ED provider. Management plans discussed with the patient,  family and they are in agreement.  CODE STATUS: Full Code  TOTAL TIME TAKING CARE OF THIS PATIENT: 50 minutes.    Gladstone Lighter M.D on 04/28/2016 at 6:27 PM  Between 7am to 6pm - Pager - [phone]  After 6pm go to [email or web address] - password [email or web address] [facility]  Office  [phone]  CC: Primary care physician; Elyse Jarvis, MD

## 2016-04-28 NOTE — ED Notes (Signed)
Critical troponin of 0.07 called by Roselyn Reef in lab. MD notified.

## 2016-04-28 NOTE — ED Provider Notes (Signed)
[facility] Emergency Department Provider Note  Time seen: 3:34 PM  I have reviewed the triage vital signs and the nursing notes.   HISTORY  Chief Complaint Shortness of Breath    HPI Jasmine Holloway is a 55 y.o. female the past medical history of COPD, diabetes, end-stage renal disease on hemodialysis, hypertension, who presents the emergency department with shortness breath. According to the patient she has been feeling short of breath all day today. She went for her full dialysis session today, was having a lot of difficulty breathing during the dialysis session as stated for the entire session, and came to the emergency department afterwards. Patient states she has been wheezing, has a history of COPD but does not wear oxygen at home. Denies any chest pain. Denies any abdominal pain fever cough or congestion. Describes her shortness of breath is moderate.  Past Medical History:  Diagnosis Date  . Asthma   . COPD (chronic obstructive pulmonary disease) ([place])   . Diabetes mellitus without complication ([place])   . Diastolic heart failure ([place])   . ESRD (end stage renal disease) on dialysis ([place])   . Hypertension   . Neuropathy ([place])   . Polycystic kidney disease   . Renal insufficiency     Patient Active Problem List   Diagnosis Date Noted  . Epigastric pain 03/28/2016  . Acute CHF (congestive heart failure) ([place]) 03/28/2016  . CHF (congestive heart failure) ([place]) 03/28/2016  . Unstable angina ([place]) 03/17/2016  . Chronic diastolic heart failure ([place]) 02/04/2016  . Hoarseness of voice 02/04/2016  . Acute on chronic diastolic CHF (congestive heart failure) ([place]) 12/22/2015  . ESRD on dialysis ([place]) 12/22/2015  . Benign essential HTN 12/22/2015  . Hyperlipidemia 12/22/2015    Past Surgical History:  Procedure Laterality Date  . ARTERIOVENOUS GRAFT PLACEMENT Right    x3 (R forearm currently used for access)  . COLON SURGERY    . VEIN HARVEST      Prior  to Admission medications   Medication Sig Start Date End Date Taking? Authorizing Provider  acetaminophen (TYLENOL) 325 MG tablet Take 2 tablets (650 mg total) by mouth every 6 (six) hours as needed for mild pain or headache. 03/18/16   Nicholes Mango, MD  albuterol (PROVENTIL HFA;VENTOLIN HFA) 108 (90 Base) MCG/ACT inhaler Inhale 2 puffs into the lungs every 6 (six) hours as needed for wheezing or shortness of breath. 03/05/16 06/03/16  Alisa Graff, FNP  albuterol (PROVENTIL HFA;VENTOLIN HFA) 108 (90 Base) MCG/ACT inhaler Inhale 2 puffs into the lungs every 6 (six) hours as needed for wheezing or shortness of breath. 04/24/16   Merlyn Lot, MD  amLODipine (NORVASC) 10 MG tablet Take 1 tablet (10 mg total) by mouth at bedtime. Patient taking differently: Take 5-10 mg by mouth 2 (two) times daily. 5 mg every morning and 10 mg at bedtime 01/17/16   Gladstone Lighter, MD  aspirin 81 MG chewable tablet Chew 1 tablet (81 mg total) by mouth daily. 01/17/16   Gladstone Lighter, MD  b complex-vitamin c-folic acid (NEPHRO-VITE) 0.8 MG TABS tablet Take 1 tablet by mouth at bedtime.    Historical Provider, MD  calcium acetate (PHOSLO) 667 MG capsule Take 667 mg by mouth 3 (three) times daily with meals.     Historical Provider, MD  cloNIDine (CATAPRES) 0.1 MG tablet Take 1 tablet (0.1 mg total) by mouth 2 (two) times daily. 01/17/16   Gladstone Lighter, MD  furosemide (LASIX) 80 MG tablet Take 1 tablet (  80 mg total) by mouth daily. 03/05/16 06/03/16  Alisa Graff, FNP  gabapentin (NEURONTIN) 300 MG capsule Take 300 mg by mouth 2 (two) times daily.     Historical Provider, MD  irbesartan (AVAPRO) 150 MG tablet Take 1 tablet (150 mg total) by mouth at bedtime. 01/17/16   Gladstone Lighter, MD  metoprolol succinate (TOPROL-XL) 50 MG 24 hr tablet Take 50 mg by mouth daily.     Historical Provider, MD  omeprazole (PRILOSEC) 20 MG capsule Take 20 mg by mouth 2 (two) times daily.     Historical Provider, MD   oxyCODONE-acetaminophen (PERCOCET) 10-325 MG tablet Take 0.5-1 tablets by mouth daily as needed for pain. 03/18/16   Historical Provider, MD  predniSONE (DELTASONE) 20 MG tablet Take 3 tablets (60 mg total) by mouth daily. 04/24/16 04/29/16  Merlyn Lot, MD  ranitidine (ZANTAC) 150 MG tablet Take 150 mg by mouth 2 (two) times daily. 01/28/16   Historical Provider, MD  rosuvastatin (CRESTOR) 5 MG tablet Take 5 mg by mouth every morning.     Historical Provider, MD    Allergies  Allergen Reactions  . Morphine And Related Nausea And Vomiting  . Vicodin [Hydrocodone-Acetaminophen] Nausea And Vomiting  . Buprenorphine Hcl Nausea And Vomiting  . Codeine Nausea And Vomiting    Family History  Problem Relation Age of Onset  . Hypertension Brother   . Heart failure Brother     Social History Social History  Substance Use Topics  . Smoking status: Former Smoker    Packs/day: 0.25    Quit date: 08/11/2015  . Smokeless tobacco: Never Used  . Alcohol use No    Review of Systems Constitutional: Negative for fever. Cardiovascular: Negative for chest pain. Respiratory: Positive for shortness of breath Gastrointestinal: Negative for abdominal pain Neurological: Negative for headache 10-point ROS otherwise negative.  ____________________________________________   PHYSICAL EXAM:  VITAL SIGNS: ED Triage Vitals  Enc Vitals Group     BP 04/28/16 1515 (!) 176/112     Pulse Rate 04/28/16 1515 100     Resp 04/28/16 1515 20     Temp 04/28/16 1515 97.8 F (36.6 C)     Temp Source 04/28/16 1515 Oral     SpO2 04/28/16 1515 90 %     Weight 04/28/16 1517 180 lb (81.6 kg)     Height 04/28/16 1517 5\' 3"  (1.6 m)     Head Circumference --      Peak Flow --      Pain Score 04/28/16 1518 7     Pain Loc --      Pain Edu? --      Excl. in [place]? --     Constitutional: Alert and oriented. Well appearing and in no distress. Eyes: Normal exam ENT   Head: Normocephalic and atraumatic.    Mouth/Throat: Mucous membranes are moist. Cardiovascular: Normal rate, regular rhythm. No murmur Respiratory: Mild tachypnea. Mild expiratory wheeze bilaterally. No obvious rales or rhonchi. Gastrointestinal: Soft and nontender. No distention. Musculoskeletal: Nontender with normal range of motion in all extremities. Right upper extremity fistula. Moderate lower extremity edema equal bilaterally. Neurologic:  Normal speech and language. No gross focal neurologic deficits Skin:  Skin is warm, dry and intact.  Psychiatric: Mood and affect are normal.   ____________________________________________    EKG  EKG reviewed and interpreted by myself shows sinus tachycardia 104 bpm, narrow QRS, normal axis, normal intervals, nonspecific ST changes. No ST elevation.  ____________________________________________    RADIOLOGY  [place]  consistent with CHF  ____________________________________________   INITIAL IMPRESSION / ASSESSMENT AND PLAN / ED COURSE  Pertinent labs & imaging results that were available during my care of the patient were reviewed by me and considered in my medical decision making (see chart for details).  The patient presents to the emergency department with shortness of breath. Patient has mild to moderate expiratory wheeze bilaterally. History of COPD. Just completed her dialysis session. Patient does have lower extremity edema which she states is increased from baseline. We will treat with DuoNeb's, Solu-Medrol, check labs and a chest x-ray and close monitor in the emergency department. Patient agreeable to plan.  X-ray consistent with CHF. Patient likely with CHF exacerbation. Patient desaturates into the 80s on room air placed on 2 L via nasal cannula. Patient continues to have moderate wheeze after breathing treatments. Likely CHF or COPD or a combination of the 2. Given the patient's hypoxia with continued shortness of breath we'll admit to the hospital for likely  diuresis/dialysis. Patient agreeable plan.  ____________________________________________   FINAL CLINICAL IMPRESSION(S) / ED DIAGNOSES  Dyspnea Pulmonary edema COPD exacerbation    Harvest Dark, MD 04/28/16 [phone]

## 2016-04-28 NOTE — ED Notes (Signed)
2 IV attempts made to get blood and Iv started both unsucessful. Asked medic if he would try.

## 2016-04-29 ENCOUNTER — Inpatient Hospital Stay: Payer: Medicare Other

## 2016-04-29 LAB — BASIC METABOLIC PANEL
ANION GAP: 10 (ref 5–15)
BUN: 41 mg/dL — ABNORMAL HIGH (ref 6–20)
CALCIUM: 7.6 mg/dL — AB (ref 8.9–10.3)
CO2: 27 mmol/L (ref 22–32)
CREATININE: 8.05 mg/dL — AB (ref 0.44–1.00)
Chloride: 100 mmol/L — ABNORMAL LOW (ref 101–111)
GFR, EST AFRICAN AMERICAN: 6 mL/min — AB (ref >60)
GFR, EST NON AFRICAN AMERICAN: 5 mL/min — AB (ref >60)
GLUCOSE: 194 mg/dL — AB (ref 65–99)
Potassium: 6.6 mmol/L (ref 3.5–5.1)
Sodium: 137 mmol/L (ref 135–145)

## 2016-04-29 LAB — CBC
HCT: 28.2 % — ABNORMAL LOW (ref 35.0–47.0)
HEMOGLOBIN: 9.5 g/dL — AB (ref 12.0–16.0)
MCH: 29.2 pg (ref 26.0–34.0)
MCHC: 33.6 g/dL (ref 32.0–36.0)
MCV: 86.7 fL (ref 80.0–100.0)
PLATELETS: 279 10*3/uL (ref 150–440)
RBC: 3.25 MIL/uL — ABNORMAL LOW (ref 3.80–5.20)
RDW: 21.8 % — ABNORMAL HIGH (ref 11.5–14.5)
WBC: 4.7 10*3/uL (ref 3.6–11.0)

## 2016-04-29 LAB — POTASSIUM: POTASSIUM: 3.7 mmol/L (ref 3.5–5.1)

## 2016-04-29 LAB — PHOSPHORUS: Phosphorus: 4.1 mg/dL (ref 2.5–4.6)

## 2016-04-29 MED ORDER — IPRATROPIUM-ALBUTEROL 0.5-2.5 (3) MG/3ML IN SOLN
3.0000 mL | Freq: Four times a day (QID) | RESPIRATORY_TRACT | Status: DC
  Administered 2016-04-29 – 2016-04-30 (×3): 3 mL via RESPIRATORY_TRACT
  Filled 2016-04-29 (×5): qty 3

## 2016-04-29 MED ORDER — LIDOCAINE-PRILOCAINE 2.5-2.5 % EX CREA
1.0000 "application " | TOPICAL_CREAM | CUTANEOUS | Status: DC | PRN
  Filled 2016-04-29: qty 5

## 2016-04-29 MED ORDER — SODIUM POLYSTYRENE SULFONATE 15 GM/60ML PO SUSP
30.0000 g | ORAL | Status: AC
  Administered 2016-04-29: 30 g via ORAL
  Filled 2016-04-29: qty 120

## 2016-04-29 MED ORDER — NEPRO/CARBSTEADY PO LIQD: 237.0000 mL | ORAL | Status: DC

## 2016-04-29 MED ORDER — EPOETIN ALFA 10000 UNIT/ML IJ SOLN
4000.0000 [IU] | Freq: Once | INTRAMUSCULAR | Status: AC
  Administered 2016-04-29: 4000 [IU] via INTRAVENOUS

## 2016-04-29 MED ORDER — HEPARIN SODIUM (PORCINE) 1000 UNIT/ML DIALYSIS
1000.0000 [IU] | INTRAMUSCULAR | Status: DC | PRN
  Filled 2016-04-29: qty 1

## 2016-04-29 NOTE — Care Management (Addendum)
Admitted to [facility] with the diagnosis of CHF. Lives alone. Sister is Andris Flurry [phone]). Established dialysis patient at [facility]. Primary Care physician is Dr. Alene Mires at [facility]. Unable to complete this assessment ------off to dialysis Shelbie Ammons RN MSN [place] Management [phone]

## 2016-04-29 NOTE — Progress Notes (Signed)
Pre dialysis  

## 2016-04-29 NOTE — Progress Notes (Signed)
Pt arrived from ED alert and oriented x4. No c/o pain on arrival, cheerful mood. SCD's applied. No concerns offered at this time.

## 2016-04-29 NOTE — Progress Notes (Signed)
Post dialysis 

## 2016-04-29 NOTE — Progress Notes (Signed)
Initial Nutrition Assessment  DOCUMENTATION CODES:   Obesity unspecified  INTERVENTION:  -Nepro Shake po with Lunch, each supplement provides 425 kcal and 19 grams protein  NUTRITION DIAGNOSIS:   Increased nutrient needs related to chronic illness as evidenced by estimated needs.  GOAL:   Patient will meet greater than or equal to 90% of their needs  MONITOR:   PO intake, Labs, I & O's, Skin  REASON FOR ASSESSMENT:   Malnutrition Screening Tool    ASSESSMENT:   Jasmine Holloway  is a 55 y.o. female with a known history of COPD not on home oxygen, diabetes mellitus, diastolic CHF, end-stage renal disease on hemodialysis, hypertension presents from dialysis center secondary to chest pain and difficulty breathing.  Spoke with [name] at bedside. She endorses good PO and appetite PTA,  But states her weight is down from her usual weight of 184# now at 177#. Per chart review she was 18# as of 03/2016 - exhibiting a 6#/3.2% insignificant wt loss over 1 month. She does have some issues with swallowing, states she has "polyps" in the back of her throat, that were irritated by her being intubated upon her previous admission. States she needs liquids with meals or she can't swallow so well. No concerns chewing No nausea/vomiting/diarrhea Nutrition-Focused physical exam completed. Findings are no fat depletion, no muscle depletion, and mod edema.  Labs and medications reviewed: K 6.3 Solumedrol, Renal MVI, Phoslo  Diet Order:  Diet renal with fluid restriction Fluid restriction: 1200 mL Fluid; Room service appropriate? Yes; Fluid consistency: Thin  Skin:  Reviewed, no issues  Last BM:  9/19  Height:   Ht Readings from Last 1 Encounters:  04/28/16 5\' 3"  (1.6 m)    Weight:   Wt Readings from Last 1 Encounters:  04/28/16 177 lb 4.8 oz (80.4 kg)    Ideal Body Weight:  52.27 kg  BMI:  Body mass index is 31.41 kg/m.  Estimated Nutritional Needs:   Kcal:   1450-1800  Protein:  55-65 gm  Fluid:  >/= 1.5L  EDUCATION NEEDS:   No education needs identified at this time  Jasmine Holloway. [name], MS, RD LDN Inpatient Clinical Dietitian Pager [phone]

## 2016-04-29 NOTE — Care Management Important Message (Signed)
Important Message  Patient Details  Name: Jasmine Holloway MRN: [id number] Date of Birth: March 24, 1961   Medicare Important Message Given:  Yes    Shelbie Ammons, RN 04/29/2016, 11:33 AM

## 2016-04-29 NOTE — Progress Notes (Signed)
Dialysis started 

## 2016-04-29 NOTE — Progress Notes (Signed)
Pre-Dialysis Assessment. 

## 2016-04-29 NOTE — Progress Notes (Signed)
Subjective:   Patient presented to the emergency room by EMS for shortness of breath that has been worsening over the posterior days.  Patient reports coughing, sputum production.  She was found to have wheezing at the time of arrival and given nebulizer treatment. She went to dialysis yesterday and completed her treatment This morning, her potassium level is noted to be high at 6.6 Urgent dialysis treatment is requested  Objective:  Vital signs in last 24 hours:  Temp:  [97.8 F (36.6 C)-98.6 F (37 C)] 98 F (36.7 C) (09/20 1249) Pulse Rate:  [78-100] 78 (09/20 1249) Resp:  [18-20] 19 (09/20 1249) BP: (126-176)/(80-112) 158/97 (09/20 1249) SpO2:  [90 %-100 %] 100 % (09/20 1249) Weight:  [80.4 kg (177 lb 4.8 oz)-81.6 kg (180 lb)] 80.4 kg (177 lb 4.8 oz) (09/19 2021)  Weight change:  Filed Weights   04/28/16 1517 04/28/16 2021  Weight: 81.6 kg (180 lb) 80.4 kg (177 lb 4.8 oz)    Intake/Output:    Intake/Output Summary (Last 24 hours) at 04/29/16 1420 Last data filed at 04/29/16 0959  Gross per 24 hour  Intake              483 ml  Output                0 ml  Net              483 ml     Physical Exam: General: No acute distress, lying in the bed  HEENT anicteric  Neck supple  Pulm/lungs Bilateral basilar crackles  CVS/Heart No rub or gallop  Abdomen:  Soft, nontender, nondistended  Extremities: Trace peripheral edema  Neurologic: Alert, oriented  Skin: No acute rashes  Access: AV graft       Basic Metabolic Panel:   Recent Labs Lab 04/24/16 0700 04/28/16 1640 04/29/16 0613 04/29/16 0640  NA 141 140 137  --   K 4.6 3.7 6.6*  --   CL 103 98* 100*  --   CO2 25 29 27   --   GLUCOSE 115* 80 194*  --   BUN 55* 25* 41*  --   CREATININE 11.47* 6.31* 8.05*  --   CALCIUM 8.2* 8.2* 7.6*  --   PHOS  --   --   --  4.1     CBC:  Recent Labs Lab 04/24/16 0700 04/28/16 1640 04/29/16 0640  WBC 8.7 7.6 4.7  HGB 10.1* 9.8* 9.5*  HCT 30.8* 29.5* 28.2*  MCV  88.9 85.5 86.7  PLT 242 309 279      Microbiology:  No results found for this or any previous visit (from the past 720 hour(s)).  Coagulation Studies: No results for input(s): LABPROT, INR in the last 72 hours.  Urinalysis: No results for input(s): COLORURINE, LABSPEC, PHURINE, GLUCOSEU, HGBUR, BILIRUBINUR, KETONESUR, PROTEINUR, UROBILINOGEN, NITRITE, LEUKOCYTESUR in the last 72 hours.  Invalid input(s): APPERANCEUR    Imaging: Dg Chest 2 View  Result Date: 04/28/2016 CLINICAL DATA:  Difficulty breathing last night, worse today, history of end-stage renal disease and diabetes EXAM: CHEST  2 VIEW COMPARISON:  Portable chest x-ray of 04/24/2016 FINDINGS: There is moderate cardiomegaly present. There is pulmonary vascular congestion with perhaps tiny pleural effusions, consistent with congestive heart failure. No focal pneumonia is seen. No bony abnormality is seen. IMPRESSION: CHF. Electronically Signed   By: Ivar Drape M.D.   On: 04/28/2016 15:57   US Venous Img Lower Unilateral Left  Result Date: 04/29/2016 CLINICAL  DATA:  Left lower extremity swelling and pain EXAM: LEFT LOWER EXTREMITY VENOUS DOPPLER ULTRASOUND TECHNIQUE: Gray-scale sonography with graded compression, as well as color Doppler and duplex ultrasound were performed to evaluate the lower extremity deep venous systems from the level of the common femoral vein and including the common femoral, femoral, profunda femoral, popliteal and calf veins including the posterior tibial, peroneal and gastrocnemius veins when visible. The superficial great saphenous vein was also interrogated. Spectral Doppler was utilized to evaluate flow at rest and with distal augmentation maneuvers in the common femoral, femoral and popliteal veins. COMPARISON:  None. FINDINGS: Contralateral Common Femoral Vein: Respiratory phasicity is normal and symmetric with the symptomatic side. No evidence of thrombus. Normal compressibility. Common Femoral Vein:  No evidence of thrombus. Normal compressibility, respiratory phasicity and response to augmentation. Saphenofemoral Junction: No evidence of thrombus. Normal compressibility and flow on color Doppler imaging. Profunda Femoral Vein: No evidence of thrombus. Normal compressibility and flow on color Doppler imaging. Femoral Vein: No evidence of thrombus. Normal compressibility, respiratory phasicity and response to augmentation. Popliteal Vein: No evidence of thrombus. Normal compressibility, respiratory phasicity and response to augmentation. Calf Veins: No evidence of thrombus. Normal compressibility and flow on color Doppler imaging. Superficial Great Saphenous Vein: No evidence of thrombus. Normal compressibility and flow on color Doppler imaging. Venous Reflux:  None. Other Findings:  None. IMPRESSION: No evidence of deep venous thrombosis. Electronically Signed   By: Jerilynn Mages.  Shick M.D.   On: 04/29/2016 13:11     Medications:     . amLODipine  10 mg Oral QHS  . aspirin  81 mg Oral Daily  . calcium acetate  667 mg Oral TID WC  . cloNIDine  0.1 mg Oral BID  . famotidine  20 mg Oral Q48H  . furosemide  80 mg Intravenous Once  . furosemide  80 mg Oral Daily  . gabapentin  300 mg Oral BID  . heparin  5,000 Units Subcutaneous Q8H  . ipratropium-albuterol  3 mL Nebulization Q6H  . methylPREDNISolone (SOLU-MEDROL) injection  40 mg Intravenous Q12H  . metoprolol succinate  50 mg Oral Daily  . multivitamin  1 tablet Oral QHS  . rosuvastatin  5 mg Oral BH-q7a  . sodium chloride flush  3 mL Intravenous Q12H  . sodium polystyrene  30 g Oral STAT   acetaminophen **OR** acetaminophen, albuterol, heparin, hydrALAZINE, lidocaine-prilocaine, ondansetron **OR** ondansetron (ZOFRAN) IV, oxyCODONE-acetaminophen  Assessment/ Plan:  55 y.o.African American female with ESRD secondary to polycystic kidney hypertension, GERD, peritoneal dialysis h/o peritonitis, arthritis, chronic left hand pain, ruptured access    TTS, [facility] [address]   1. End Stage Renal Disease:  with hyperkalemia We'll arrange for urgent dialysis today due to fluid overload and hyperkalemia Ultra filtration goal 2-2.5 kg as tolerated Routine HD treatment tomorrow  2. Anemia of chronic kidney disease Hemoglobin 9.5, acceptable low dose EPO with HD  3.  acute pulmonary edema - urgent dialysis today, fluid removal as tolerated   LOS: 1 Sheriann Newmann 9/20/20172:20 PM

## 2016-04-29 NOTE — Progress Notes (Signed)
Dialysis complete

## 2016-04-29 NOTE — Progress Notes (Signed)
[place] [facility] [place] NAME: Jasmine Holloway    MR#:  [id number]  DATE OF BIRTH:  Apr 02, 1961  SUBJECTIVE:  CHIEF COMPLAINT:   Chief Complaint  Patient presents with  . Shortness of Breath  seen at HD, feeling better, K 6.6 REVIEW OF SYSTEMS:  Review of Systems  Constitutional: Negative for chills, fever and weight loss.  HENT: Negative for nosebleeds and sore throat.   Eyes: Negative for blurred vision.  Respiratory: Positive for shortness of breath. Negative for cough and wheezing.   Cardiovascular: Negative for chest pain, orthopnea, leg swelling and PND.  Gastrointestinal: Negative for abdominal pain, constipation, diarrhea, heartburn, nausea and vomiting.  Genitourinary: Negative for dysuria and urgency.  Musculoskeletal: Negative for back pain.  Skin: Negative for rash.  Neurological: Negative for dizziness, speech change, focal weakness and headaches.  Endo/Heme/Allergies: Does not bruise/bleed easily.  Psychiatric/Behavioral: Negative for depression.   DRUG ALLERGIES:   Allergies  Allergen Reactions  . Morphine And Related Nausea And Vomiting  . Vicodin [Hydrocodone-Acetaminophen] Nausea And Vomiting  . Buprenorphine Hcl Nausea And Vomiting  . Codeine Nausea And Vomiting   VITALS:  Blood pressure 135/75, pulse 74, temperature 98.2 F (36.8 C), temperature source Oral, resp. rate 17, height 5\' 3"  (1.6 m), weight 83.9 kg (185 lb), SpO2 98 %. PHYSICAL EXAMINATION:  Physical Exam  Constitutional: She is oriented to person, place, and time and well-developed, well-nourished, and in no distress.  HENT:  Head: Normocephalic and atraumatic.  Eyes: Conjunctivae and EOM are normal. Pupils are equal, round, and reactive to light.  Neck: Normal range of motion. Neck supple. No tracheal deviation present. No thyromegaly present.  Cardiovascular: Normal rate, regular rhythm and normal heart sounds.   Pulmonary/Chest: Effort normal and  breath sounds normal. No respiratory distress. She has no wheezes. She exhibits no tenderness.  Abdominal: Soft. Bowel sounds are normal. She exhibits no distension. There is no tenderness.  Musculoskeletal: Normal range of motion.  Neurological: She is alert and oriented to person, place, and time. No cranial nerve deficit.  Skin: Skin is warm and dry. No rash noted.  Psychiatric: Mood and affect normal.   LABORATORY PANEL:   CBC  Recent Labs Lab 04/29/16 0640  WBC 4.7  HGB 9.5*  HCT 28.2*  PLT 279   ------------------------------------------------------------------------------------------------------------------ Chemistries   Recent Labs Lab 04/28/16 1640 04/29/16 0613  NA 140 137  K 3.7 6.6*  CL 98* 100*  CO2 29 27  GLUCOSE 80 194*  BUN 25* 41*  CREATININE 6.31* 8.05*  CALCIUM 8.2* 7.6*  AST 23  --   ALT 26  --   ALKPHOS 54  --   BILITOT 0.9  --    RADIOLOGY:  US Venous Img Lower Unilateral Left  Result Date: 04/29/2016 CLINICAL DATA:  Left lower extremity swelling and pain EXAM: LEFT LOWER EXTREMITY VENOUS DOPPLER ULTRASOUND TECHNIQUE: Gray-scale sonography with graded compression, as well as color Doppler and duplex ultrasound were performed to evaluate the lower extremity deep venous systems from the level of the common femoral vein and including the common femoral, femoral, profunda femoral, popliteal and calf veins including the posterior tibial, peroneal and gastrocnemius veins when visible. The superficial great saphenous vein was also interrogated. Spectral Doppler was utilized to evaluate flow at rest and with distal augmentation maneuvers in the common femoral, femoral and popliteal veins. COMPARISON:  None. FINDINGS: Contralateral Common Femoral Vein: Respiratory phasicity is normal and symmetric with the symptomatic side.  No evidence of thrombus. Normal compressibility. Common Femoral Vein: No evidence of thrombus. Normal compressibility, respiratory phasicity  and response to augmentation. Saphenofemoral Junction: No evidence of thrombus. Normal compressibility and flow on color Doppler imaging. Profunda Femoral Vein: No evidence of thrombus. Normal compressibility and flow on color Doppler imaging. Femoral Vein: No evidence of thrombus. Normal compressibility, respiratory phasicity and response to augmentation. Popliteal Vein: No evidence of thrombus. Normal compressibility, respiratory phasicity and response to augmentation. Calf Veins: No evidence of thrombus. Normal compressibility and flow on color Doppler imaging. Superficial Great Saphenous Vein: No evidence of thrombus. Normal compressibility and flow on color Doppler imaging. Venous Reflux:  None. Other Findings:  None. IMPRESSION: No evidence of deep venous thrombosis. Electronically Signed   By: Jerilynn Mages.  Shick M.D.   On: 04/29/2016 13:11   ASSESSMENT AND PLAN:  Jasmine Holloway  is a 55 y.o. female with a known history of COPD not on home oxygen, diabetes mellitus, diastolic CHF, end-stage renal disease on hemodialysis, hypertension presents from dialysis center secondary to chest pain and difficulty breathing.  #1 Acute hypoxic respiratory failure- COPD and CHF exacerbation - continue nebs and IV steroids, no indication for ABX at this time - continue o2 support and wean as tolerated - ECHO with LVH and diastolic dysfunction, will need additional dialysis session - IV lasix once and continue oral lasix              #2 elevated troponin-demand ischemia and poor renal clearance. -Patient just had a cardiac catheterization in August 2017 and showed normal coronaries  #3 HTN- continue home meds - continue IV hydralazine PRN  #4 ESRD on HD- T-Th-S schedule, had dialysis today, might need another extra session as fluid overloaded and CXR with pulm congestion - nephrology following  #5 DVT Prophylaxis- heparin SQ    All the records are reviewed and case discussed with Care Management/Social  Worker. Management plans discussed with the patient, family and they are in agreement.  CODE STATUS: FULL CODE  TOTAL TIME TAKING CARE OF THIS PATIENT: 25 minutes.   More than 50% of the time was spent in counseling/coordination of care: YES  POSSIBLE D/C IN 1-2 DAYS, DEPENDING ON CLINICAL CONDITION.   [facility], Kateena Degroote M.D on 04/29/2016 at 5:02 PM  Between 7am to 6pm - Pager - [phone]  After 6pm go to [email or web address] - Proofreader  [facility]  Office  [phone]  CC: Primary care physician; Elyse Jarvis, MD  Note: This dictation was prepared with Dragon dictation along with smaller phrase technology. Any transcriptional errors that result from this process are unintentional.

## 2016-04-30 LAB — BASIC METABOLIC PANEL
ANION GAP: 9 (ref 5–15)
BUN: 27 mg/dL — ABNORMAL HIGH (ref 6–20)
CALCIUM: 8 mg/dL — AB (ref 8.9–10.3)
CHLORIDE: 100 mmol/L — AB (ref 101–111)
CO2: 33 mmol/L — AB (ref 22–32)
Creatinine, Ser: 5.45 mg/dL — ABNORMAL HIGH (ref 0.44–1.00)
GFR calc non Af Amer: 8 mL/min — ABNORMAL LOW (ref >60)
GFR, EST AFRICAN AMERICAN: 9 mL/min — AB (ref >60)
GLUCOSE: 155 mg/dL — AB (ref 65–99)
Potassium: 3.9 mmol/L (ref 3.5–5.1)
Sodium: 142 mmol/L (ref 135–145)

## 2016-04-30 MED ORDER — EPOETIN ALFA 10000 UNIT/ML IJ SOLN
4000.0000 [IU] | INTRAMUSCULAR | Status: DC
  Administered 2016-04-30: 4000 [IU] via INTRAVENOUS

## 2016-04-30 NOTE — Care Management (Signed)
Independent in all adls, denies issues accessing medical care, obtaining medications or with transportation.  Current with her PCP.  Drives herself to dialysis.  Says she is compliant with dialysis appointments and has not missed any. Drive self to dialysis. Discussed the need to assess for home 02 with primary nurse prior to discharge.

## 2016-04-30 NOTE — Progress Notes (Signed)
Pre hd vitals 

## 2016-04-30 NOTE — Progress Notes (Signed)
Post hd vitals 

## 2016-04-30 NOTE — Discharge Instructions (Signed)
End-Stage Kidney Disease The kidneys are two organs that lie on either side of the spine between the middle of the back and the front of the abdomen. The kidneys:   Remove wastes and extra water from the blood.   Produce important hormones. These help keep bones strong, regulate blood pressure, and help create red blood cells.   Balance the fluids and chemicals in the blood and tissues. End-stage kidney disease occurs when the kidneys are so damaged that they cannot do their job. When the kidneys cannot do their job, life-threatening problems occur. The body cannot stay clean and strong without the help of the kidneys. In end-stage kidney disease, the kidneys cannot get better.You need a new kidney or treatments to do some of the work healthy kidneys do in order to stay alive. CAUSES  End-stage kidney disease usually occurs when a long-lasting (chronic) kidney disease gets worse. It may also occur after the kidneys are suddenly damaged (acute kidney injury).  SYMPTOMS   Swelling (edema) of the legs, ankles, or feet.   Tiredness (lethargy).   Nausea or vomiting.   Confusion.   Problems with urination, such as:   Decreased urine production.   Frequent urination, especially at night.   Frequent accidents in children who are potty trained.   Muscle twitches and cramps.   Persistent itchiness.   Loss of appetite.   Headaches.   Abnormally dark or light skin.   Numbness in the hands or feet.   Easy bruising.   Frequent hiccups.   Menstruation stops. DIAGNOSIS  Your health care provider will measure your blood pressure and take some tests. These may include:   Urine tests.   Blood tests.   Imaging tests, such as:   An ultrasound exam.   Computed tomography (CT).  A kidney biopsy. TREATMENT  There are two treatments for end-stage kidney disease:   A procedure that removes toxic wastes from the body (dialysis).   Receiving a new kidney  (kidney transplant). Both of these treatments have serious risks and consequences. Your health care provider will help you determine which treatment is best for you based on your health, age, and other factors. In addition to having dialysis or a kidney transplant, you may need to take medicines to control high blood pressure (hypertension) and cholesterol and to decrease phosphorus levels in your blood.  HOME CARE INSTRUCTIONS  Follow your prescribed diet.   Take medicines only as directed by your health care provider.   Do not take any new medicines (prescription, over-the-counter, or nutritional supplements) unless approved by your health care provider. Many medicines can worsen your kidney damage or need to have the dose adjusted.   Keep all follow-up visits as directed by your health care provider. MAKE SURE YOU:  Understand these instructions.  Will watch your condition.  Will get help right away if you are not doing well or get worse.   This information is not intended to replace advice given to you by your health care provider. Make sure you discuss any questions you have with your health care provider.   Document Released: 10/17/2003 Document Revised: 08/17/2014 Document Reviewed: 03/25/2012 Elsevier Interactive Patient Education 2016 [employer] Inc[employer]  

## 2016-04-30 NOTE — Progress Notes (Signed)
Subjective:   Patient presented to the emergency room by EMS for shortness of breath that has been worsening over the posterior days.  Patient reports coughing, sputum production.  She was found to have wheezing at the time of arrival and given nebulizer treatment.  Overall she feels better. 2.5 L removed with HD SOB has resolved. Patient is requesting to go home  Patient seen during dialysis Tolerating well    HEMODIALYSIS FLOWSHEET:  Blood Flow Rate (mL/min): 400 mL/min Arterial Pressure (mmHg): -160 mmHg Venous Pressure (mmHg): 300 mmHg Transmembrane Pressure (mmHg): 60 mmHg Ultrafiltration Rate (mL/min): 1200 mL/min Dialysate Flow Rate (mL/min): 800 ml/min Conductivity: Machine : 15.1 Conductivity: Machine : 15.1 Dialysis Fluid Bolus: Normal Saline Bolus Amount (mL): 100 mL Dialysate Change: 2K Intra-Hemodialysis Comments: 1239. rinse, pt alert, no c/o, vss, vp high,     Objective:  Vital signs in last 24 hours:  Temp:  [98 F (36.7 C)-98.5 F (36.9 C)] 98.3 F (36.8 C) (09/21 1018) Pulse Rate:  [72-91] 75 (09/21 1055) Resp:  [14-26] 15 (09/21 1055) BP: (126-168)/(67-101) 168/89 (09/21 1055) SpO2:  [92 %-100 %] 95 % (09/21 1055) FiO2 (%):  [97 %] 97 % (09/20 1941) Weight:  [81 kg (178 lb 9.2 oz)-83.9 kg (185 lb)] 82.2 kg (181 lb 3.5 oz) (09/21 1018)  Weight change: 2.268 kg (5 lb) Filed Weights   04/29/16 1802 04/29/16 1828 04/30/16 1018  Weight: 81 kg (178 lb 9.2 oz) 81 kg (178 lb 9.2 oz) 82.2 kg (181 lb 3.5 oz)    Intake/Output:    Intake/Output Summary (Last 24 hours) at 04/30/16 1125 Last data filed at 04/30/16 1018  Gross per 24 hour  Intake              360 ml  Output             2740 ml  Net            -2380 ml     Physical Exam: General: No acute distress, lying in the bed  HEENT anicteric  Neck supple  Pulm/lungs decreased BS at bases, normal effort  CVS/Heart No rub or gallop  Abdomen:  Soft, nontender, nondistended  Extremities: Trace  peripheral edema  Neurologic: Alert, oriented  Skin: No acute rashes  Access: AV graft       Basic Metabolic Panel:   Recent Labs Lab 04/24/16 0700 04/28/16 1640 04/29/16 0613 04/29/16 0640 04/29/16 1856 04/30/16 0345  NA 141 140 137  --   --  142  K 4.6 3.7 6.6*  --  3.7 3.9  CL 103 98* 100*  --   --  100*  CO2 25 29 27   --   --  33*  GLUCOSE 115* 80 194*  --   --  155*  BUN 55* 25* 41*  --   --  27*  CREATININE 11.47* 6.31* 8.05*  --   --  5.45*  CALCIUM 8.2* 8.2* 7.6*  --   --  8.0*  PHOS  --   --   --  4.1  --   --      CBC:  Recent Labs Lab 04/24/16 0700 04/28/16 1640 04/29/16 0640  WBC 8.7 7.6 4.7  HGB 10.1* 9.8* 9.5*  HCT 30.8* 29.5* 28.2*  MCV 88.9 85.5 86.7  PLT 242 309 279      Microbiology:  No results found for this or any previous visit (from the past 720 hour(s)).  Coagulation Studies: No results for input(s):  LABPROT, INR in the last 72 hours.  Urinalysis: No results for input(s): COLORURINE, LABSPEC, PHURINE, GLUCOSEU, HGBUR, BILIRUBINUR, KETONESUR, PROTEINUR, UROBILINOGEN, NITRITE, LEUKOCYTESUR in the last 72 hours.  Invalid input(s): APPERANCEUR    Imaging: Dg Chest 2 View  Result Date: 04/28/2016 CLINICAL DATA:  Difficulty breathing last night, worse today, history of end-stage renal disease and diabetes EXAM: CHEST  2 VIEW COMPARISON:  Portable chest x-ray of 04/24/2016 FINDINGS: There is moderate cardiomegaly present. There is pulmonary vascular congestion with perhaps tiny pleural effusions, consistent with congestive heart failure. No focal pneumonia is seen. No bony abnormality is seen. IMPRESSION: CHF. Electronically Signed   By: Ivar Drape M.D.   On: 04/28/2016 15:57   US Venous Img Lower Unilateral Left  Result Date: 04/29/2016 CLINICAL DATA:  Left lower extremity swelling and pain EXAM: LEFT LOWER EXTREMITY VENOUS DOPPLER ULTRASOUND TECHNIQUE: Gray-scale sonography with graded compression, as well as color Doppler and  duplex ultrasound were performed to evaluate the lower extremity deep venous systems from the level of the common femoral vein and including the common femoral, femoral, profunda femoral, popliteal and calf veins including the posterior tibial, peroneal and gastrocnemius veins when visible. The superficial great saphenous vein was also interrogated. Spectral Doppler was utilized to evaluate flow at rest and with distal augmentation maneuvers in the common femoral, femoral and popliteal veins. COMPARISON:  None. FINDINGS: Contralateral Common Femoral Vein: Respiratory phasicity is normal and symmetric with the symptomatic side. No evidence of thrombus. Normal compressibility. Common Femoral Vein: No evidence of thrombus. Normal compressibility, respiratory phasicity and response to augmentation. Saphenofemoral Junction: No evidence of thrombus. Normal compressibility and flow on color Doppler imaging. Profunda Femoral Vein: No evidence of thrombus. Normal compressibility and flow on color Doppler imaging. Femoral Vein: No evidence of thrombus. Normal compressibility, respiratory phasicity and response to augmentation. Popliteal Vein: No evidence of thrombus. Normal compressibility, respiratory phasicity and response to augmentation. Calf Veins: No evidence of thrombus. Normal compressibility and flow on color Doppler imaging. Superficial Great Saphenous Vein: No evidence of thrombus. Normal compressibility and flow on color Doppler imaging. Venous Reflux:  None. Other Findings:  None. IMPRESSION: No evidence of deep venous thrombosis. Electronically Signed   By: Jerilynn Mages.  Shick M.D.   On: 04/29/2016 13:11     Medications:     . amLODipine  10 mg Oral QHS  . aspirin  81 mg Oral Daily  . calcium acetate  667 mg Oral TID WC  . cloNIDine  0.1 mg Oral BID  . famotidine  20 mg Oral Q48H  . feeding supplement (NEPRO CARB STEADY)  237 mL Oral Q24H  . furosemide  80 mg Intravenous Once  . furosemide  80 mg Oral Daily   . gabapentin  300 mg Oral BID  . heparin  5,000 Units Subcutaneous Q8H  . ipratropium-albuterol  3 mL Nebulization Q6H  . methylPREDNISolone (SOLU-MEDROL) injection  40 mg Intravenous Q12H  . metoprolol succinate  50 mg Oral Daily  . multivitamin  1 tablet Oral QHS  . rosuvastatin  5 mg Oral BH-q7a  . sodium chloride flush  3 mL Intravenous Q12H   acetaminophen **OR** acetaminophen, albuterol, heparin, hydrALAZINE, lidocaine-prilocaine, ondansetron **OR** ondansetron (ZOFRAN) IV, oxyCODONE-acetaminophen  Assessment/ Plan:  55 y.o.African American female with ESRD secondary to polycystic kidney hypertension, GERD, peritoneal dialysis h/o peritonitis, arthritis, chronic left hand pain, ruptured access   TTS, [facility] [address]   1. End Stage Renal Disease:  with hyperkalemia Patient seen during dialysis Tolerating  well   2. Anemia of chronic kidney disease Hemoglobin 9.5, acceptable low dose EPO with HD  3.  acute pulmonary edema - 2500 cc removed yesterday - UF as tolerated today - overall improved    LOS: 2 Jasmine Holloway 9/21/201711:25 AM

## 2016-04-30 NOTE — Progress Notes (Signed)
End of hd tx  

## 2016-04-30 NOTE — Progress Notes (Signed)
Start of hd tx

## 2016-04-30 NOTE — Clinical Social Work Note (Addendum)
MSW was consulted regarding patient needing a ride home.  Patient has been trying to find a ride home and she was unable to find one, patient agreed to take cab home.  MSW provided cab voucher for patient to go home, MSW gave taxi company phone number to bedside nurse to schedule taxi ride for patient.  Patient is able to ambulate independently and is alert and oriented x4 per bedside nurse.  MSW to sign off, please reconsult if other social work needs arise.  Jones Broom. Jasmine Holloway, MSW [phone]  Mon-Fri 8a-4:30p 04/30/2016 2:15 PM

## 2016-04-30 NOTE — Progress Notes (Signed)
Pre hd assessment  

## 2016-04-30 NOTE — Progress Notes (Signed)
Post hd assessment 

## 2016-04-30 NOTE — Discharge Summary (Signed)
[facility] NAME: Jasmine Holloway    MR#:  [id number]  DATE OF BIRTH:  06-Jun-1961  DATE OF ADMISSION:  04/28/2016   ADMITTING PHYSICIAN: Gladstone Lighter, MD  DATE OF DISCHARGE: 04/30/2016  PRIMARY CARE PHYSICIAN: Elyse Jarvis, MD   ADMISSION DIAGNOSIS:  Acute pulmonary edema ([place]) [J81.0] COPD exacerbation ([place]) [J44.1] DISCHARGE DIAGNOSIS:  Active Problems:   CHF exacerbation ([place])  SECONDARY DIAGNOSIS:   Past Medical History:  Diagnosis Date  . Asthma   . COPD (chronic obstructive pulmonary disease) ([place])   . Diabetes mellitus without complication ([place])   . Diastolic heart failure ([place])   . ESRD (end stage renal disease) on dialysis ([place])   . Hypertension   . Neuropathy ([place])   . Polycystic kidney disease   . Renal insufficiency    HOSPITAL COURSE:  Jasmine Holloway a 55 y.o. femalewith a known history of COPD not on home oxygen, diabetes mellitus, diastolic CHF, end-stage renal disease on hemodialysis, hypertension admitted secondary to chest pain and difficulty breathing.  #1 Acute hypoxic respiratory failure- COPD and CHF exacerbation - well compensated at this time. - Echo with LVH and diastolic dysfunction - required 2 inpt HD sessions and feeling much better  #2 elevated troponin-demand ischemia and poor renal clearance. -Patient just had a cardiac catheterization in August 2017 and showed normal coronaries  #3 HTN- continue home meds - continue IV hydralazine PRN  #4 ESRD on HD- T-Th-S schedule, had dialysis while in Hospital  5. Hyperkalemia: resolved with HD  DISCHARGE CONDITIONS:  STABLE CONSULTS OBTAINED:  Treatment Team:  Murlean Iba, MD DRUG ALLERGIES:   Allergies  Allergen Reactions  . Morphine And Related Nausea And Vomiting  . Vicodin [Hydrocodone-Acetaminophen] Nausea And Vomiting  . Buprenorphine Hcl Nausea And Vomiting  . Codeine Nausea And Vomiting   DISCHARGE  MEDICATIONS:     Medication List    TAKE these medications   acetaminophen 325 MG tablet Commonly known as:  TYLENOL Take 2 tablets (650 mg total) by mouth every 6 (six) hours as needed for mild pain or headache.   albuterol 108 (90 Base) MCG/ACT inhaler Commonly known as:  PROVENTIL HFA;VENTOLIN HFA Inhale 2 puffs into the lungs every 6 (six) hours as needed for wheezing or shortness of breath.   amLODipine 10 MG tablet Commonly known as:  NORVASC Take 10 mg by mouth daily.   aspirin 81 MG chewable tablet Chew 1 tablet (81 mg total) by mouth daily.   b complex-vitamin c-folic acid 0.8 MG Tabs tablet Take 1 tablet by mouth at bedtime.   calcium acetate 667 MG capsule Commonly known as:  PHOSLO Take 667 mg by mouth 3 (three) times daily with meals.   cloNIDine 0.1 MG tablet Commonly known as:  CATAPRES Take 1 tablet (0.1 mg total) by mouth 2 (two) times daily.   furosemide 80 MG tablet Commonly known as:  LASIX Take 1 tablet (80 mg total) by mouth daily.   gabapentin 300 MG capsule Commonly known as:  NEURONTIN Take 300 mg by mouth daily.   metoprolol succinate 50 MG 24 hr tablet Commonly known as:  TOPROL-XL Take 50 mg by mouth daily.   omeprazole 20 MG capsule Commonly known as:  PRILOSEC Take 20 mg by mouth 2 (two) times daily.   oxyCODONE-acetaminophen 10-325 MG tablet Commonly known as:  PERCOCET Take 0.5-1 tablets by mouth daily as needed for pain.   rosuvastatin 5 MG tablet Commonly known  as:  CRESTOR Take 5 mg by mouth at bedtime.        DISCHARGE INSTRUCTIONS:   DIET:  Renal diet DISCHARGE CONDITION:  Good ACTIVITY:  Activity as tolerated OXYGEN:  Home Oxygen: No.  Oxygen Delivery: room air DISCHARGE LOCATION:  home   If you experience worsening of your admission symptoms, develop shortness of breath, life threatening emergency, suicidal or homicidal thoughts you must seek medical attention immediately by calling 911 or calling your MD  immediately  if symptoms less severe.  You Must read complete instructions/literature along with all the possible adverse reactions/side effects for all the Medicines you take and that have been prescribed to you. Take any new Medicines after you have completely understood and accpet all the possible adverse reactions/side effects.   Please note  You were cared for by a hospitalist during your hospital stay. If you have any questions about your discharge medications or the care you received while you were in the hospital after you are discharged, you can call the unit and asked to speak with the hospitalist on call if the hospitalist that took care of you is not available. Once you are discharged, your primary care physician will handle any further medical issues. Please note that NO REFILLS for any discharge medications will be authorized once you are discharged, as it is imperative that you return to your primary care physician (or establish a relationship with a primary care physician if you do not have one) for your aftercare needs so that they can reassess your need for medications and monitor your lab values.    On the day of Discharge:  VITAL SIGNS:  Blood pressure (!) 148/85, pulse 74, temperature 98 F (36.7 C), temperature source Oral, resp. rate 15, height 5\' 3"  (1.6 m), weight 82.2 kg (181 lb 3.5 oz), SpO2 100 %. PHYSICAL EXAMINATION:  GENERAL:  55 y.o.-year-old patient lying in the bed with no acute distress.  EYES: Pupils equal, round, reactive to light and accommodation. No scleral icterus. Extraocular muscles intact.  HEENT: Head atraumatic, normocephalic. Oropharynx and nasopharynx clear.  NECK:  Supple, no jugular venous distention. No thyroid enlargement, no tenderness.  LUNGS: Normal breath sounds bilaterally, no wheezing, rales,rhonchi or crepitation. No use of accessory muscles of respiration.  CARDIOVASCULAR: S1, S2 normal. No murmurs, rubs, or gallops.  ABDOMEN: Soft,  non-tender, non-distended. Bowel sounds present. No organomegaly or mass.  EXTREMITIES: No pedal edema, cyanosis, or clubbing.  NEUROLOGIC: Cranial nerves II through XII are intact. Muscle strength 5/5 in all extremities. Sensation intact. Gait not checked.  PSYCHIATRIC: The patient is alert and oriented x 3.  SKIN: No obvious rash, lesion, or ulcer.  DATA REVIEW:   CBC  Recent Labs Lab 04/29/16 0640  WBC 4.7  HGB 9.5*  HCT 28.2*  PLT 279    Chemistries   Recent Labs Lab 04/28/16 1640  04/30/16 0345  NA 140  < > 142  K 3.7  < > 3.9  CL 98*  < > 100*  CO2 29  < > 33*  GLUCOSE 80  < > 155*  BUN 25*  < > 27*  CREATININE 6.31*  < > 5.45*  CALCIUM 8.2*  < > 8.0*  AST 23  --   --   ALT 26  --   --   ALKPHOS 54  --   --   BILITOT 0.9  --   --   < > = values in this interval not displayed.  Management plans discussed with the patient, family and they are in agreement.  CODE STATUS: FULL CODE  TOTAL TIME TAKING CARE OF THIS PATIENT: 45 minutes.    [facility], Bobbie Virden M.D on 04/30/2016 at 2:25 PM  Between 7am to 6pm - Pager - [phone]  After 6pm go to [email or web address] - Proofreader  [employer] [facility]  Office  [phone]  CC: Primary care physician; Elyse Jarvis, MD   Note: This dictation was prepared with Dragon dictation along with smaller phrase technology. Any transcriptional errors that result from this process are unintentional.

## 2016-04-30 NOTE — Progress Notes (Signed)
Patient able to ambulate independently for d/c purposes. Jasmine Holloway [facility]

## 2016-05-01 LAB — HEPATITIS B SURFACE ANTIGEN: Hepatitis B Surface Ag: NEGATIVE

## 2016-05-08 ENCOUNTER — Encounter: Payer: Self-pay | Admitting: Cardiovascular Disease

## 2016-05-11 ENCOUNTER — Emergency Department: Payer: Medicare Other

## 2016-05-11 ENCOUNTER — Encounter: Payer: Self-pay | Admitting: Emergency Medicine

## 2016-05-11 ENCOUNTER — Inpatient Hospital Stay
Admission: EM | Admit: 2016-05-11 | Discharge: 2016-05-12 | DRG: 291 | Disposition: A | Discharge status: Home / Self Care | Payer: Medicare Other | Attending: Internal Medicine | Admitting: Internal Medicine

## 2016-05-11 DIAGNOSIS — Z992 Dependence on renal dialysis: Secondary | ICD-10-CM | POA: Diagnosis not present

## 2016-05-11 DIAGNOSIS — E1140 Type 2 diabetes mellitus with diabetic neuropathy, unspecified: Secondary | ICD-10-CM | POA: Diagnosis present

## 2016-05-11 DIAGNOSIS — K219 Gastro-esophageal reflux disease without esophagitis: Secondary | ICD-10-CM | POA: Diagnosis present

## 2016-05-11 DIAGNOSIS — Z87891 Personal history of nicotine dependence: Secondary | ICD-10-CM | POA: Diagnosis not present

## 2016-05-11 DIAGNOSIS — D631 Anemia in chronic kidney disease: Secondary | ICD-10-CM | POA: Diagnosis present

## 2016-05-11 DIAGNOSIS — Z79891 Long term (current) use of opiate analgesic: Secondary | ICD-10-CM | POA: Diagnosis not present

## 2016-05-11 DIAGNOSIS — Z7951 Long term (current) use of inhaled steroids: Secondary | ICD-10-CM

## 2016-05-11 DIAGNOSIS — E1122 Type 2 diabetes mellitus with diabetic chronic kidney disease: Secondary | ICD-10-CM | POA: Diagnosis present

## 2016-05-11 DIAGNOSIS — N2581 Secondary hyperparathyroidism of renal origin: Secondary | ICD-10-CM | POA: Diagnosis present

## 2016-05-11 DIAGNOSIS — M1990 Unspecified osteoarthritis, unspecified site: Secondary | ICD-10-CM | POA: Diagnosis present

## 2016-05-11 DIAGNOSIS — Q613 Polycystic kidney, unspecified: Secondary | ICD-10-CM

## 2016-05-11 DIAGNOSIS — J9690 Respiratory failure, unspecified, unspecified whether with hypoxia or hypercapnia: Secondary | ICD-10-CM | POA: Diagnosis present

## 2016-05-11 DIAGNOSIS — R0602 Shortness of breath: Secondary | ICD-10-CM

## 2016-05-11 DIAGNOSIS — N186 End stage renal disease: Secondary | ICD-10-CM | POA: Diagnosis present

## 2016-05-11 DIAGNOSIS — J810 Acute pulmonary edema: Secondary | ICD-10-CM

## 2016-05-11 DIAGNOSIS — I132 Hypertensive heart and chronic kidney disease with heart failure and with stage 5 chronic kidney disease, or end stage renal disease: Secondary | ICD-10-CM | POA: Diagnosis present

## 2016-05-11 DIAGNOSIS — J449 Chronic obstructive pulmonary disease, unspecified: Secondary | ICD-10-CM | POA: Diagnosis present

## 2016-05-11 DIAGNOSIS — I5033 Acute on chronic diastolic (congestive) heart failure: Secondary | ICD-10-CM | POA: Diagnosis present

## 2016-05-11 DIAGNOSIS — Z885 Allergy status to narcotic agent status: Secondary | ICD-10-CM

## 2016-05-11 LAB — COMPREHENSIVE METABOLIC PANEL
ALK PHOS: 71 U/L (ref 38–126)
ALT: 39 U/L (ref 14–54)
AST: 49 U/L — AB (ref 15–41)
Albumin: 3.4 g/dL — ABNORMAL LOW (ref 3.5–5.0)
Anion gap: 12 (ref 5–15)
BILIRUBIN TOTAL: 0.6 mg/dL (ref 0.3–1.2)
BUN: 56 mg/dL — AB (ref 6–20)
CALCIUM: 8.4 mg/dL — AB (ref 8.9–10.3)
CO2: 28 mmol/L (ref 22–32)
CREATININE: 10.41 mg/dL — AB (ref 0.44–1.00)
Chloride: 101 mmol/L (ref 101–111)
GFR calc Af Amer: 4 mL/min — ABNORMAL LOW (ref >60)
GFR, EST NON AFRICAN AMERICAN: 4 mL/min — AB (ref >60)
Glucose, Bld: 106 mg/dL — ABNORMAL HIGH (ref 65–99)
POTASSIUM: 4.9 mmol/L (ref 3.5–5.1)
Sodium: 141 mmol/L (ref 135–145)
TOTAL PROTEIN: 7.6 g/dL (ref 6.5–8.1)

## 2016-05-11 LAB — CBC
HEMATOCRIT: 30.4 % — AB (ref 35.0–47.0)
Hemoglobin: 10.1 g/dL — ABNORMAL LOW (ref 12.0–16.0)
MCH: 29 pg (ref 26.0–34.0)
MCHC: 33.2 g/dL (ref 32.0–36.0)
MCV: 87.4 fL (ref 80.0–100.0)
Platelets: 289 10*3/uL (ref 150–440)
RBC: 3.48 MIL/uL — ABNORMAL LOW (ref 3.80–5.20)
RDW: 22.2 % — AB (ref 11.5–14.5)
WBC: 10.3 10*3/uL (ref 3.6–11.0)

## 2016-05-11 LAB — GLUCOSE, CAPILLARY: Glucose-Capillary: 96 mg/dL (ref 65–99)

## 2016-05-11 LAB — MRSA PCR SCREENING: MRSA by PCR: NEGATIVE

## 2016-05-11 LAB — TROPONIN I: TROPONIN I: 0.05 ng/mL — AB (ref <0.03)

## 2016-05-11 LAB — BRAIN NATRIURETIC PEPTIDE: B Natriuretic Peptide: 2105 pg/mL — ABNORMAL HIGH (ref 0.0–100.0)

## 2016-05-11 MED ORDER — METOPROLOL TARTRATE 5 MG/5ML IV SOLN
2.5000 mg | Freq: Once | INTRAVENOUS | Status: DC
  Filled 2016-05-11: qty 5

## 2016-05-11 MED ORDER — AMLODIPINE BESYLATE 10 MG PO TABS: 10.0000 mg | ORAL_TABLET | Freq: Every day | ORAL | Status: DC

## 2016-05-11 MED ORDER — ACETAMINOPHEN 325 MG PO TABS
162.5000 mg | ORAL_TABLET | Freq: Four times a day (QID) | ORAL | Status: DC | PRN
  Administered 2016-05-11: 325 mg via ORAL
  Filled 2016-05-11: qty 1

## 2016-05-11 MED ORDER — HEPARIN SODIUM (PORCINE) 5000 UNIT/ML IJ SOLN
5000.0000 [IU] | Freq: Three times a day (TID) | INTRAMUSCULAR | Status: DC
  Administered 2016-05-11 – 2016-05-12 (×2): 5000 [IU] via SUBCUTANEOUS
  Filled 2016-05-11 (×2): qty 1

## 2016-05-11 MED ORDER — OXYCODONE-ACETAMINOPHEN 10-325 MG PO TABS: 0.5000 | ORAL_TABLET | Freq: Four times a day (QID) | ORAL | Status: DC | PRN

## 2016-05-11 MED ORDER — INSULIN ASPART 100 UNIT/ML ~~LOC~~ SOLN: 0.0000 [IU] | Freq: Three times a day (TID) | SUBCUTANEOUS | Status: DC

## 2016-05-11 MED ORDER — ALBUTEROL SULFATE (2.5 MG/3ML) 0.083% IN NEBU
2.5000 mg | INHALATION_SOLUTION | Freq: Four times a day (QID) | RESPIRATORY_TRACT | Status: DC | PRN
Start: 2016-05-11 — End: 2016-05-12
  Administered 2016-05-12: 2.5 mg via RESPIRATORY_TRACT
  Filled 2016-05-11: qty 3

## 2016-05-11 MED ORDER — METOPROLOL SUCCINATE ER 50 MG PO TB24
50.0000 mg | ORAL_TABLET | Freq: Every day | ORAL | Status: DC
  Administered 2016-05-11 – 2016-05-12 (×2): 50 mg via ORAL
  Filled 2016-05-11 (×2): qty 1

## 2016-05-11 MED ORDER — METOPROLOL SUCCINATE ER 50 MG PO TB24: 50.0000 mg | ORAL_TABLET | Freq: Every day | ORAL | Status: DC

## 2016-05-11 MED ORDER — ORAL CARE MOUTH RINSE
15.0000 mL | Freq: Two times a day (BID) | OROMUCOSAL | Status: DC
  Administered 2016-05-11: 15 mL via OROMUCOSAL

## 2016-05-11 MED ORDER — PANTOPRAZOLE SODIUM 40 MG PO TBEC: 40.0000 mg | DELAYED_RELEASE_TABLET | Freq: Every day | ORAL | Status: DC

## 2016-05-11 MED ORDER — CALCIUM ACETATE (PHOS BINDER) 667 MG PO CAPS
667.0000 mg | ORAL_CAPSULE | Freq: Three times a day (TID) | ORAL | Status: DC
  Administered 2016-05-11: 667 mg via ORAL
  Filled 2016-05-11: qty 1

## 2016-05-11 MED ORDER — CLONIDINE HCL 0.1 MG PO TABS
0.1000 mg | ORAL_TABLET | Freq: Two times a day (BID) | ORAL | Status: DC
  Administered 2016-05-11 – 2016-05-12 (×2): 0.1 mg via ORAL
  Filled 2016-05-11 (×2): qty 1

## 2016-05-11 MED ORDER — RENA-VITE PO TABS: 1.0000 | ORAL_TABLET | Freq: Every day | ORAL | Status: DC

## 2016-05-11 MED ORDER — GABAPENTIN 300 MG PO CAPS
300.0000 mg | ORAL_CAPSULE | Freq: Every day | ORAL | Status: DC
  Administered 2016-05-12: 300 mg via ORAL
  Filled 2016-05-11: qty 1

## 2016-05-11 MED ORDER — CALCIUM ACETATE (PHOS BINDER) 667 MG PO CAPS
667.0000 mg | ORAL_CAPSULE | Freq: Three times a day (TID) | ORAL | Status: DC
  Administered 2016-05-12 (×2): 667 mg via ORAL
  Filled 2016-05-11 (×2): qty 1

## 2016-05-11 MED ORDER — ALBUTEROL SULFATE HFA 108 (90 BASE) MCG/ACT IN AERS: 2.0000 | INHALATION_SPRAY | Freq: Four times a day (QID) | RESPIRATORY_TRACT | Status: DC | PRN

## 2016-05-11 MED ORDER — PANTOPRAZOLE SODIUM 40 MG PO TBEC
40.0000 mg | DELAYED_RELEASE_TABLET | Freq: Every day | ORAL | Status: DC
  Administered 2016-05-11 – 2016-05-12 (×2): 40 mg via ORAL
  Filled 2016-05-11 (×2): qty 1

## 2016-05-11 MED ORDER — ASPIRIN 81 MG PO CHEW
81.0000 mg | CHEWABLE_TABLET | Freq: Every day | ORAL | Status: DC
  Administered 2016-05-12: 81 mg via ORAL
  Filled 2016-05-11: qty 1

## 2016-05-11 MED ORDER — FUROSEMIDE 10 MG/ML IJ SOLN
80.0000 mg | Freq: Once | INTRAMUSCULAR | Status: AC
  Administered 2016-05-11: 80 mg via INTRAVENOUS
  Filled 2016-05-11: qty 8

## 2016-05-11 MED ORDER — ROSUVASTATIN CALCIUM 10 MG PO TABS
5.0000 mg | ORAL_TABLET | Freq: Every day | ORAL | Status: DC
  Administered 2016-05-11: 5 mg via ORAL
  Filled 2016-05-11: qty 1

## 2016-05-11 MED ORDER — AMLODIPINE BESYLATE 10 MG PO TABS
10.0000 mg | ORAL_TABLET | Freq: Every day | ORAL | Status: DC
  Administered 2016-05-11 – 2016-05-12 (×2): 10 mg via ORAL
  Filled 2016-05-11 (×2): qty 1

## 2016-05-11 MED ORDER — OXYCODONE HCL 5 MG PO TABS: 5.0000 mg | ORAL_TABLET | Freq: Four times a day (QID) | ORAL | Status: DC | PRN

## 2016-05-11 MED ORDER — ACETAMINOPHEN 325 MG PO TABS: 162.5000 mg | ORAL_TABLET | Freq: Four times a day (QID) | ORAL | Status: DC | PRN

## 2016-05-11 NOTE — Progress Notes (Signed)
Called Dr. Ara Kussmaul regarding blood pressure medication for patient.  Appropriate order was placed.  Christene Slates  05/11/2016  9:14 PM

## 2016-05-11 NOTE — Progress Notes (Signed)
1010 pt. Transferred to CCu 12 from ED 17 while on the bipap.

## 2016-05-11 NOTE — ED Notes (Signed)
Report to ICU, await resp for transfer.

## 2016-05-11 NOTE — H&P (Signed)
[facility] NAME: Jasmine Holloway    MR#:  [id number]  DATE OF BIRTH:  [birth year]  DATE OF ADMISSION:  05/11/2016  PRIMARY CARE PHYSICIAN: Elyse Jarvis, MD   REQUESTING/REFERRING PHYSICIAN: Dr. Dahlia Client  CHIEF COMPLAINT:   Chief Complaint  Patient presents with  . Shortness of Breath    HISTORY OF PRESENT ILLNESS: Jasmine Holloway  is a 55 y.o. female with a known history of Asthma, COPD, DM, ESRD on HD on T,T,S, Htn- recent admission for fluid overload. Regular on HD- last was on Saturday, regular, still from Sunday started to feel more SOB, increasing with minimal exertion,and today morning was very SOb- came to ER- Started on bipap- felt better. Nephro called in for getting urgent HD. Denies fever/ chills. Have cough with clear sputum, no chest pain but have tightness.  PAST MEDICAL HISTORY:   Past Medical History:  Diagnosis Date  . Asthma   . COPD (chronic obstructive pulmonary disease) ([place])   . Diabetes mellitus without complication ([place])   . Diastolic heart failure ([place])   . ESRD (end stage renal disease) on dialysis ([place])   . Hypertension   . Neuropathy ([place])   . Polycystic kidney disease   . Renal insufficiency     PAST SURGICAL HISTORY: Past Surgical History:  Procedure Laterality Date  . ARTERIOVENOUS GRAFT PLACEMENT Right    x3 (R forearm currently used for access)  . COLON SURGERY    . VEIN HARVEST      SOCIAL HISTORY:  Social History  Substance Use Topics  . Smoking status: Former Smoker    Packs/day: 0.25    Quit date: 08/11/2015  . Smokeless tobacco: Never Used  . Alcohol use No    FAMILY HISTORY:  Family History  Problem Relation Age of Onset  . Hypertension Brother   . Heart failure Brother     DRUG ALLERGIES:  Allergies  Allergen Reactions  . Morphine And Related Nausea And Vomiting  . Vicodin [Hydrocodone-Acetaminophen] Nausea And Vomiting  . Buprenorphine Hcl Nausea And Vomiting  . Codeine  Nausea And Vomiting    REVIEW OF SYSTEMS:   CONSTITUTIONAL: No fever, fatigue or weakness.  EYES: No blurred or double vision.  EARS, NOSE, AND THROAT: No tinnitus or ear pain.  RESPIRATORY: positive cough, shortness of breath,no wheezing or hemoptysis.  CARDIOVASCULAR: No chest pain, orthopnea, edema.  GASTROINTESTINAL: No nausea, vomiting, diarrhea or abdominal pain.  GENITOURINARY: No dysuria, hematuria.  ENDOCRINE: No polyuria, nocturia,  HEMATOLOGY: No anemia, easy bruising or bleeding SKIN: No rash or lesion. MUSCULOSKELETAL: No joint pain or arthritis.   NEUROLOGIC: No tingling, numbness, weakness.  PSYCHIATRY: No anxiety or depression.   MEDICATIONS AT HOME:  Prior to Admission medications   Medication Sig Start Date End Date Taking? Authorizing Provider  acetaminophen (TYLENOL) 325 MG tablet Take 2 tablets (650 mg total) by mouth every 6 (six) hours as needed for mild pain or headache. 03/18/16  Yes Nicholes Mango, MD  albuterol (PROVENTIL HFA;VENTOLIN HFA) 108 (90 Base) MCG/ACT inhaler Inhale 2 puffs into the lungs every 6 (six) hours as needed for wheezing or shortness of breath. 03/05/16 06/03/16 Yes Alisa Graff, FNP  amLODipine (NORVASC) 10 MG tablet Take 10 mg by mouth daily.   Yes Historical Provider, MD  aspirin 81 MG chewable tablet Chew 1 tablet (81 mg total) by mouth daily. 01/17/16  Yes Gladstone Lighter, MD  b complex-vitamin c-folic acid (NEPHRO-VITE) 0.8 MG TABS tablet  Take 1 tablet by mouth at bedtime.   Yes Historical Provider, MD  calcium acetate (PHOSLO) 667 MG capsule Take 667 mg by mouth 3 (three) times daily with meals.    Yes Historical Provider, MD  cloNIDine (CATAPRES) 0.1 MG tablet Take 1 tablet (0.1 mg total) by mouth 2 (two) times daily. 01/17/16  Yes Gladstone Lighter, MD  furosemide (LASIX) 80 MG tablet Take 1 tablet (80 mg total) by mouth daily. 03/05/16 06/03/16 Yes Alisa Graff, FNP  gabapentin (NEURONTIN) 300 MG capsule Take 300 mg by mouth daily.     Yes Historical Provider, MD  metoprolol succinate (TOPROL-XL) 50 MG 24 hr tablet Take 50 mg by mouth daily.    Yes Historical Provider, MD  omeprazole (PRILOSEC) 20 MG capsule Take 20 mg by mouth 2 (two) times daily.    Yes Historical Provider, MD  rosuvastatin (CRESTOR) 5 MG tablet Take 5 mg by mouth at bedtime.    Yes Historical Provider, MD  oxyCODONE-acetaminophen (PERCOCET) 10-325 MG tablet Take 0.5-1 tablets by mouth daily as needed for pain. 03/18/16   Historical Provider, MD      PHYSICAL EXAMINATION:   VITAL SIGNS: Blood pressure (!) 182/108, pulse 71, temperature 98 F (36.7 C), temperature source Oral, resp. rate 16, height 5\' 3"  (1.6 m), weight 82.1 kg (181 lb), SpO2 100 %.  GENERAL:  55 y.o.-year-old patient lying in the bed with no acute distress.  EYES: Pupils equal, round, reactive to light and accommodation. No scleral icterus. Extraocular muscles intact.  HEENT: Head atraumatic, normocephalic. Oropharynx and nasopharynx clear.  NECK:  Supple, no jugular venous distention. No thyroid enlargement, no tenderness.  LUNGS: Normal breath sounds bilaterally, no wheezing, bilateral crepitation. No use of accessory muscles of respiration. Requiring bipap. CARDIOVASCULAR: S1, S2 normal. No murmurs, rubs, or gallops.  ABDOMEN: Soft, nontender, nondistended. Bowel sounds present. No organomegaly or mass.  EXTREMITIES: No pedal edema, cyanosis, or clubbing.  NEUROLOGIC: Cranial nerves II through XII are intact. Muscle strength 5/5 in all extremities. Sensation intact. Gait not checked.  PSYCHIATRIC: The patient is alert and oriented x 3.  SKIN: No obvious rash, lesion, or ulcer.   LABORATORY PANEL:   CBC  Recent Labs Lab 05/11/16 0621  WBC 10.3  HGB 10.1*  HCT 30.4*  PLT 289  MCV 87.4  MCH 29.0  MCHC 33.2  RDW 22.2*   ------------------------------------------------------------------------------------------------------------------  Chemistries   Recent Labs Lab  05/11/16 0621  NA 141  K 4.9  CL 101  CO2 28  GLUCOSE 106*  BUN 56*  CREATININE 10.41*  CALCIUM 8.4*  AST 49*  ALT 39  ALKPHOS 71  BILITOT 0.6   ------------------------------------------------------------------------------------------------------------------ estimated creatinine clearance is 6.1 mL/min (by C-G formula based on SCr of 10.41 mg/dL (H)). ------------------------------------------------------------------------------------------------------------------ No results for input(s): TSH, T4TOTAL, T3FREE, THYROIDAB in the last 72 hours.  Invalid input(s): FREET3   Coagulation profile No results for input(s): INR, PROTIME in the last 168 hours. ------------------------------------------------------------------------------------------------------------------- No results for input(s): DDIMER in the last 72 hours. -------------------------------------------------------------------------------------------------------------------  Cardiac Enzymes  Recent Labs Lab 05/11/16 0621  TROPONINI 0.05*   ------------------------------------------------------------------------------------------------------------------ Invalid input(s): POCBNP  ---------------------------------------------------------------------------------------------------------------  Urinalysis    Component Value Date/Time   COLORURINE STRAW (A) 03/27/2016 1722   APPEARANCEUR CLEAR (A) 03/27/2016 1722   LABSPEC 1.005 03/27/2016 1722   PHURINE 9.0 (H) 03/27/2016 1722   GLUCOSEU 150 (A) 03/27/2016 1722   HGBUR NEGATIVE 03/27/2016 1722   BILIRUBINUR NEGATIVE 03/27/2016 1722   KETONESUR NEGATIVE 03/27/2016 1722  PROTEINUR >500 (A) 03/27/2016 1722   NITRITE NEGATIVE 03/27/2016 1722   LEUKOCYTESUR NEGATIVE 03/27/2016 1722     RADIOLOGY: Dg Chest Portable 1 View  Result Date: 05/11/2016 CLINICAL DATA:  Shortness of breath and productive cough. EXAM: PORTABLE CHEST 1 VIEW COMPARISON:  04/28/2016  FINDINGS: Cardiac enlargement with pulmonary vascular congestion and diffuse perihilar interstitial infiltrates consistent with edema. Small bilateral pleural effusions. Linear scarring in the left mid lung. Surgical clips in the left axilla. No pneumothorax. Calcification of the aorta. IMPRESSION: Cardiac enlargement with pulmonary vascular congestion, interstitial edema, and small bilateral pleural effusions likely indicating congestive failure. Progression since previous study. Electronically Signed   By: Lucienne Capers M.D.   On: 05/11/2016 06:58    EKG: Orders placed or performed during the hospital encounter of 05/11/16  . EKG 12-Lead  . EKG 12-Lead  . ED EKG  . ED EKG    IMPRESSION AND PLAN:  * Acute respi failure with fluid overload secondary to Ac on ch diastolic heart failure.   She have ESRD and on HD.    Bipap currently, will get urgent HD today.   Give IV lasix as she is also getting oral lasix at home.  * Hypertension   COnt home meds , get HD today.  * DM   Keep on ISS.  * COPD   No active wheezing, cont inhalers.   All the records are reviewed and case discussed with ED provider. Management plans discussed with the patient, family and they are in agreement.  CODE STATUS: Full Code Status History    Date Active Date Inactive Code Status Order ID Comments User Context   04/28/2016  8:30 PM 04/30/2016  5:49 PM Full Code [id number]  Gladstone Lighter, MD Inpatient   03/28/2016  6:25 PM 03/29/2016  5:26 PM Full Code [id number]  Vaughan Basta, MD ED   03/17/2016  6:06 AM 03/18/2016  7:51 AM Full Code [id number]  Saundra Shelling, MD ED   01/10/2016  9:11 PM 01/17/2016  9:36 PM Full Code [id number]  Mikael Spray, NP ED   12/21/2015  6:08 AM 12/22/2015  4:09 PM Full Code [id number]  Saundra Shelling, MD Inpatient   02/24/2015  3:52 AM 02/24/2015  4:15 PM Full Code [id number]  Harrie Foreman, MD Inpatient    Advance Directive Documentation   Flowsheet Row Most Recent Value   Type of Advance Directive  Healthcare Power of Attorney  Pre-existing out of facility DNR order (yellow form or pink MOST form)  No data  "MOST" Form in Place?  No data     Condition is critical due to requirement of bipap, will monitor in stepdown unit for now.  TOTAL TIME TAKING CARE OF THIS PATIENT: 50 critical care minutes.    Vaughan Basta M.D on 05/11/2016   Between 7am to 6pm - Pager - [phone]  After 6pm go to [email or web address] - password [email or web address] [facility]  Office  [phone]  CC: Primary care physician; Elyse Jarvis, MD   Note: This dictation was prepared with Dragon dictation along with smaller phrase technology. Any transcriptional errors that result from this process are unintentional.

## 2016-05-11 NOTE — ED Notes (Addendum)
Pt c/o SOB and productive cough with thick white sputum beginning yesterday evening. Pt denies pain.   Pt reports hx of renal disorder, respiratory failure, CHF, hypertension, and hyperlipidemia. Pt reports hx of cardiac arrest in May 2017.   Pt goes to dialysis T/Th/Sat

## 2016-05-11 NOTE — ED Provider Notes (Signed)
[facility] Emergency Department Provider Note   ____________________________________________   First MD Initiated Contact with Patient 05/11/16 [phone]     (approximate)  I have reviewed the triage vital signs and the nursing notes.   HISTORY  Chief Complaint Shortness of Breath    HPI Jasmine Holloway is a 55 y.o. female who comes into the hospital today with shortness of breath. The patient reports that she started having some trouble breathing today with some nausea and she vomited. She reports that she's not feeling well. She does have a history of congestive heart failure and reports that she is supposed to see a cardiologist at the end of the month. Her sat her symptoms started last week. She was here last week for the same and was admitted for heart failure. She reports that she was supposed to go to the clinic but she couldn't get there. She reports that yesterday her chest started getting heavy and her breath was worse. She reports tried to use her inhaler but it did not help. The patient also has a history of end-stage renal disease on dialysis. The patient receives dialysis on Tuesdays, Thursdays and Saturdays. She reports that she did receive dialysis on Saturday and they took off approximately 2 L. The patient is here for evaluation and treatment.   Past Medical History:  Diagnosis Date  . Asthma   . COPD (chronic obstructive pulmonary disease) ([place])   . Diabetes mellitus without complication ([place])   . Diastolic heart failure ([place])   . ESRD (end stage renal disease) on dialysis ([place])   . Hypertension   . Neuropathy ([place])   . Polycystic kidney disease   . Renal insufficiency     Patient Active Problem List   Diagnosis Date Noted  . CHF exacerbation ([place]) 04/28/2016  . Epigastric pain 03/28/2016  . Acute CHF (congestive heart failure) ([place]) 03/28/2016  . CHF (congestive heart failure) ([place]) 03/28/2016  . Unstable angina ([place]) 03/17/2016  .  Chronic diastolic heart failure ([place]) 02/04/2016  . Hoarseness of voice 02/04/2016  . Acute on chronic diastolic CHF (congestive heart failure) ([place]) 12/22/2015  . ESRD on dialysis ([place]) 12/22/2015  . Benign essential HTN 12/22/2015  . Hyperlipidemia 12/22/2015    Past Surgical History:  Procedure Laterality Date  . ARTERIOVENOUS GRAFT PLACEMENT Right    x3 (R forearm currently used for access)  . COLON SURGERY    . VEIN HARVEST      Prior to Admission medications   Medication Sig Start Date End Date Taking? Authorizing Provider  acetaminophen (TYLENOL) 325 MG tablet Take 2 tablets (650 mg total) by mouth every 6 (six) hours as needed for mild pain or headache. 03/18/16  Yes Nicholes Mango, MD  albuterol (PROVENTIL HFA;VENTOLIN HFA) 108 (90 Base) MCG/ACT inhaler Inhale 2 puffs into the lungs every 6 (six) hours as needed for wheezing or shortness of breath. 03/05/16 06/03/16 Yes Alisa Graff, FNP  amLODipine (NORVASC) 10 MG tablet Take 10 mg by mouth daily.   Yes Historical Provider, MD  aspirin 81 MG chewable tablet Chew 1 tablet (81 mg total) by mouth daily. 01/17/16  Yes Gladstone Lighter, MD  b complex-vitamin c-folic acid (NEPHRO-VITE) 0.8 MG TABS tablet Take 1 tablet by mouth at bedtime.   Yes Historical Provider, MD  calcium acetate (PHOSLO) 667 MG capsule Take 667 mg by mouth 3 (three) times daily with meals.    Yes Historical Provider, MD  cloNIDine (CATAPRES) 0.1 MG tablet Take 1  tablet (0.1 mg total) by mouth 2 (two) times daily. 01/17/16  Yes Gladstone Lighter, MD  furosemide (LASIX) 80 MG tablet Take 1 tablet (80 mg total) by mouth daily. 03/05/16 06/03/16 Yes Alisa Graff, FNP  gabapentin (NEURONTIN) 300 MG capsule Take 300 mg by mouth daily.    Yes Historical Provider, MD  metoprolol succinate (TOPROL-XL) 50 MG 24 hr tablet Take 50 mg by mouth daily.    Yes Historical Provider, MD  omeprazole (PRILOSEC) 20 MG capsule Take 20 mg by mouth 2 (two) times daily.    Yes Historical  Provider, MD  rosuvastatin (CRESTOR) 5 MG tablet Take 5 mg by mouth at bedtime.    Yes Historical Provider, MD  oxyCODONE-acetaminophen (PERCOCET) 10-325 MG tablet Take 0.5-1 tablets by mouth daily as needed for pain. 03/18/16   Historical Provider, MD    Allergies Morphine and related; Vicodin [hydrocodone-acetaminophen]; Buprenorphine hcl; and Codeine  Family History  Problem Relation Age of Onset  . Hypertension Brother   . Heart failure Brother     Social History Social History  Substance Use Topics  . Smoking status: Former Smoker    Packs/day: 0.25    Quit date: 08/11/2015  . Smokeless tobacco: Never Used  . Alcohol use No    Review of Systems Constitutional: No fever/chills Eyes: No visual changes. ENT: No sore throat. Cardiovascular:chest pain. Respiratory:  shortness of breath. Gastrointestinal: No abdominal pain.  No nausea, no vomiting.  No diarrhea.  No constipation. Genitourinary: Negative for dysuria. Musculoskeletal: Negative for back pain. Skin: Negative for rash. Neurological: Negative for headaches, focal weakness or numbness.  10-point ROS otherwise negative.  ____________________________________________   PHYSICAL EXAM:  VITAL SIGNS: ED Triage Vitals  Enc Vitals Group     BP 05/11/16 0554 (!) 117/115     Pulse Rate 05/11/16 0554 98     Resp 05/11/16 0554 (!) 24     Temp 05/11/16 0554 (!) 92 F (33.3 C)     Temp Source 05/11/16 0554 Oral     SpO2 05/11/16 0554 92 %     Weight 05/11/16 0555 181 lb (82.1 kg)     Height 05/11/16 0555 5\' 3"  (1.6 m)     Head Circumference --      Peak Flow --      Pain Score 05/11/16 0620 0     Pain Loc --      Pain Edu? --      Excl. in [place]? --     Constitutional: Alert and oriented. Well appearing and in Moderate distress. Eyes: Conjunctivae are normal. PERRL. EOMI. Head: Atraumatic. Nose: No congestion/rhinnorhea. Mouth/Throat: Mucous membranes are moist.  Oropharynx non-erythematous. Cardiovascular:  Normal rate, regular rhythm. Grossly normal heart sounds.  Good peripheral circulation. Respiratory: Increased respiratory effort.  No retractions. Crackles in bilateral bases Gastrointestinal: Soft and nontender. No distention. Positive bowel sounds Musculoskeletal: No lower extremity tenderness nor edema.   Neurologic:  Normal speech and language.  Skin:  Skin is warm, dry and intact.  Psychiatric: Mood and affect are normal.   ____________________________________________   LABS (all labs ordered are listed, but only abnormal results are displayed)  Labs Reviewed  CBC - Abnormal; Notable for the following:       Result Value   RBC 3.48 (*)    Hemoglobin 10.1 (*)    HCT 30.4 (*)    RDW 22.2 (*)    All other components within normal limits  COMPREHENSIVE METABOLIC PANEL - Abnormal; Notable for the  following:    Glucose, Bld 106 (*)    BUN 56 (*)    Creatinine, Ser 10.41 (*)    Calcium 8.4 (*)    Albumin 3.4 (*)    AST 49 (*)    GFR calc non Af Amer 4 (*)    GFR calc Af Amer 4 (*)    All other components within normal limits  TROPONIN I - Abnormal; Notable for the following:    Troponin I 0.05 (*)    All other components within normal limits  BRAIN NATRIURETIC PEPTIDE - Abnormal; Notable for the following:    B Natriuretic Peptide 2,105.0 (*)    All other components within normal limits   ____________________________________________  EKG  ED ECG REPORT I, Loney Hering, the attending physician, personally viewed and interpreted this ECG.   Date: 05/11/2016  EKG Time: 604  Rate: 104  Rhythm: sinus tachycardia  Axis: Normal  Intervals:none  ST&T Change: none  ____________________________________________  RADIOLOGY  Chest x-ray ____________________________________________   PROCEDURES  Procedure(s) performed: None  Procedures  Critical Care performed: Yes, see critical care note(s)   CRITICAL CARE Performed by: Charlesetta Ivory P   Total  critical care time: 30 minutes  Critical care time was exclusive of separately billable procedures and treating other patients.  Critical care was necessary to treat or prevent imminent or life-threatening deterioration.  Critical care was time spent personally by me on the following activities: development of treatment plan with patient and/or surrogate as well as nursing, discussions with consultants, evaluation of patient's response to treatment, examination of patient, obtaining history from patient or surrogate, ordering and performing treatments and interventions, ordering and review of laboratory studies, ordering and review of radiographic studies, pulse oximetry and re-evaluation of patient's condition.   ____________________________________________   INITIAL IMPRESSION / ASSESSMENT AND PLAN / ED COURSE  Pertinent labs & imaging results that were available during my care of the patient were reviewed by me and considered in my medical decision making (see chart for details).  This is a 55 year old female who comes into the hospital today with some shortness of breath. The patient has end-stage renal disease on dialysis. She does appear to have some puffiness to her face as well as some crackles in her chest. My concern is that the patient has some fluid overload which is causing her shortness of breath. I will check some blood work and I will place the patient on BiPAP to help with her work of breathing. The patient will be reassessed once I received the results of her blood work and then I will contact her nephrologist to obtain some dialysis for the patient.  Clinical Course  Value Comment By Time  DG Chest Portable 1 View Cardiac enlargement with pulmonary vascular congestion, interstitial edema, and small bilateral pleural effusions likely indicating congestive failure. Progression since previous study.   Loney Hering, MD 10/02 [phone]    The patient had some improvement in  her symptoms on BiPAP. She has end-stage renal disease on dialysis he still needs to have some dialysis to remove fluid from her lungs. I attempted to contact the nephrologist but did not hear from them immediately. I spoke to the admitting physician who will accept the patient to the stepdown floor and will further evaluate the patient. The patient will be admitted to the hospital. ____________________________________________   FINAL CLINICAL IMPRESSION(S) / ED DIAGNOSES  Final diagnoses:  Acute pulmonary edema (HCC)  SOB (shortness of breath)  NEW MEDICATIONS STARTED DURING THIS VISIT:  New Prescriptions   No medications on file     Note:  This document was prepared using Dragon voice recognition software and may include unintentional dictation errors.    Loney Hering, MD 05/11/16 [phone]

## 2016-05-11 NOTE — Telephone Encounter (Signed)
This encounter was created in error - please disregard.

## 2016-05-11 NOTE — ED Triage Notes (Signed)
Pt presents to ED with c/o SOB with cough x1 weeks, worsen this morning. Hx of CHF.

## 2016-05-11 NOTE — Progress Notes (Signed)
Subjective:   [name] admitted to [facility] today for Acute pulmonary edema ([place]) [J81.0] SOB (shortness of breath) [R06.02]  She was placed on BiPAP and brought to the ICU. She is now on  and breathing well with full sentences.   Last dialysis was Saturday.   She states she had fried fish.    Objective:  Vital signs in last 24 hours:  Temp:  [97.9 F (36.6 C)-98.2 F (36.8 C)] 97.9 F (36.6 C) (10/02 1025) Pulse Rate:  [64-98] 69 (10/02 1100) Resp:  [12-24] 22 (10/02 1100) BP: (117-196)/(96-115) 161/103 (10/02 1100) SpO2:  [92 %-100 %] 99 % (10/02 1100) FiO2 (%):  [35 %] 35 % (10/02 1025) Weight:  [82.1 kg (181 lb)-82.7 kg (182 lb 5.1 oz)] 82.7 kg (182 lb 5.1 oz) (10/02 1025)  Weight change:  Filed Weights   05/11/16 0555 05/11/16 1025  Weight: 82.1 kg (181 lb) 82.7 kg (182 lb 5.1 oz)    Intake/Output:   No intake or output data in the 24 hours ending 05/11/16 1344   Physical Exam: General: No acute distress, lying in the bed  HEENT anicteric  Neck supple  Pulm/lungs Bilateral crackles  CVS/Heart No rub or gallop  Abdomen:  Soft, nontender, nondistended  Extremities: Trace peripheral edema  Neurologic: Alert, oriented  Skin: No acute rashes  Access: AV graft       Basic Metabolic Panel:   Recent Labs Lab 05/11/16 0621  NA 141  K 4.9  CL 101  CO2 28  GLUCOSE 106*  BUN 56*  CREATININE 10.41*  CALCIUM 8.4*     CBC:  Recent Labs Lab 05/11/16 0621  WBC 10.3  HGB 10.1*  HCT 30.4*  MCV 87.4  PLT 289      Microbiology:  Recent Results (from the past 720 hour(s))  MRSA PCR Screening     Status: None   Collection Time: 05/11/16 10:26 AM  Result Value Ref Range Status   MRSA by PCR NEGATIVE NEGATIVE Final    Comment:        The GeneXpert MRSA Assay (FDA approved for NASAL specimens only), is one component of a comprehensive MRSA colonization surveillance program. It is not intended to diagnose MRSA infection nor to guide  or monitor treatment for MRSA infections.     Coagulation Studies: No results for input(s): LABPROT, INR in the last 72 hours.  Urinalysis: No results for input(s): COLORURINE, LABSPEC, PHURINE, GLUCOSEU, HGBUR, BILIRUBINUR, KETONESUR, PROTEINUR, UROBILINOGEN, NITRITE, LEUKOCYTESUR in the last 72 hours.  Invalid input(s): APPERANCEUR    Imaging: Dg Chest Portable 1 View  Result Date: 05/11/2016 CLINICAL DATA:  Shortness of breath and productive cough. EXAM: PORTABLE CHEST 1 VIEW COMPARISON:  04/28/2016 FINDINGS: Cardiac enlargement with pulmonary vascular congestion and diffuse perihilar interstitial infiltrates consistent with edema. Small bilateral pleural effusions. Linear scarring in the left mid lung. Surgical clips in the left axilla. No pneumothorax. Calcification of the aorta. IMPRESSION: Cardiac enlargement with pulmonary vascular congestion, interstitial edema, and small bilateral pleural effusions likely indicating congestive failure. Progression since previous study. Electronically Signed   By: Lucienne Capers M.D.   On: 05/11/2016 06:58     Medications:     . amLODipine  10 mg Oral Daily  . [START ON 05/12/2016] aspirin  81 mg Oral Daily  . calcium acetate  667 mg Oral TID WC  . cloNIDine  0.1 mg Oral BID  . [START ON 05/12/2016] gabapentin  300 mg Oral Daily  . heparin  5,000 Units Subcutaneous Q8H  . insulin aspart  0-9 Units Subcutaneous TID WC  . mouth rinse  15 mL Mouth Rinse BID  . metoprolol succinate  50 mg Oral Daily  . [START ON 05/12/2016] multivitamin  1 tablet Oral QHS  . pantoprazole  40 mg Oral Daily  . rosuvastatin  5 mg Oral QHS   oxyCODONE **AND** acetaminophen, albuterol  Assessment/ Plan:  55 y.o.  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   1. End Stage Renal Disease: requiring emergent  hemodialysis today due to pulmonary edema.  - Monitor daily for dialysis need.   2. Anemia of chronic kidney disease: hemoglobin 10.1 - epo on TTS treatments.   3.  Acute pulmonary edema: echo from 6/3 without reduced ejection fraction - Repeat echo - Consult cardiology  4. Hypertension: elevated - restarted on amlodipine, clonidine and metoprolol - losartan held due to hyperkalemia recently.   5. Secondary Hyperparathyroidism: with hyperphosphatemia. outpatient PTH elevated at 711. Outpatient phosphorus of 7.5 - calcium acetate with meals.    LOS: [place], [place] 10/2/20171:44 PM

## 2016-05-11 NOTE — Progress Notes (Signed)
MD Kolluru rounded at patients bedside, reported that patient has remained hypertensive since admission. MD stated he would order all of patient antihypertensive medications and to give before dialysis. Will continue to monitor patient.  Notified pharmacy that Kolluru wanted patient to get doses of medications today instead of starting tomorrow. See new orders, will continue to monitor patient.

## 2016-05-11 NOTE — Progress Notes (Signed)
Patient left floor to go to dialysis via transport tech, ticket to ride printed and given to transporter. Report given to Katharine Look, Dialysis RN.

## 2016-05-11 NOTE — ED Notes (Signed)
Resting when undisturbed, states c pap has reduced resp distress.

## 2016-05-12 LAB — BASIC METABOLIC PANEL
Anion gap: 7 (ref 5–15)
BUN: 31 mg/dL — AB (ref 6–20)
CO2: 33 mmol/L — ABNORMAL HIGH (ref 22–32)
CREATININE: 7.11 mg/dL — AB (ref 0.44–1.00)
Calcium: 8.2 mg/dL — ABNORMAL LOW (ref 8.9–10.3)
Chloride: 102 mmol/L (ref 101–111)
GFR calc Af Amer: 7 mL/min — ABNORMAL LOW (ref >60)
GFR, EST NON AFRICAN AMERICAN: 6 mL/min — AB (ref >60)
Glucose, Bld: 97 mg/dL (ref 65–99)
Potassium: 4.5 mmol/L (ref 3.5–5.1)
SODIUM: 142 mmol/L (ref 135–145)

## 2016-05-12 LAB — CBC
HCT: 29.3 % — ABNORMAL LOW (ref 35.0–47.0)
HCT: 29.8 % — ABNORMAL LOW (ref 35.0–47.0)
HEMOGLOBIN: 9.9 g/dL — AB (ref 12.0–16.0)
Hemoglobin: 9.7 g/dL — ABNORMAL LOW (ref 12.0–16.0)
MCH: 29.1 pg (ref 26.0–34.0)
MCH: 29.3 pg (ref 26.0–34.0)
MCHC: 33.3 g/dL (ref 32.0–36.0)
MCHC: 33.4 g/dL (ref 32.0–36.0)
MCV: 87.4 fL (ref 80.0–100.0)
MCV: 87.7 fL (ref 80.0–100.0)
PLATELETS: 272 10*3/uL (ref 150–440)
PLATELETS: 273 10*3/uL (ref 150–440)
RBC: 3.35 MIL/uL — ABNORMAL LOW (ref 3.80–5.20)
RBC: 3.39 MIL/uL — AB (ref 3.80–5.20)
RDW: 22.5 % — AB (ref 11.5–14.5)
RDW: 21.8 % — ABNORMAL HIGH (ref 11.5–14.5)
WBC: 8.7 10*3/uL (ref 3.6–11.0)
WBC: 9.3 10*3/uL (ref 3.6–11.0)

## 2016-05-12 LAB — RENAL FUNCTION PANEL
ANION GAP: 10 (ref 5–15)
Albumin: 3.2 g/dL — ABNORMAL LOW (ref 3.5–5.0)
BUN: 34 mg/dL — ABNORMAL HIGH (ref 6–20)
CALCIUM: 8.3 mg/dL — AB (ref 8.9–10.3)
CO2: 30 mmol/L (ref 22–32)
CREATININE: 7.88 mg/dL — AB (ref 0.44–1.00)
Chloride: 100 mmol/L — ABNORMAL LOW (ref 101–111)
GFR, EST AFRICAN AMERICAN: 6 mL/min — AB (ref >60)
GFR, EST NON AFRICAN AMERICAN: 5 mL/min — AB (ref >60)
Glucose, Bld: 95 mg/dL (ref 65–99)
PHOSPHORUS: 3.7 mg/dL (ref 2.5–4.6)
Potassium: 4.7 mmol/L (ref 3.5–5.1)
SODIUM: 140 mmol/L (ref 135–145)

## 2016-05-12 LAB — GLUCOSE, CAPILLARY
GLUCOSE-CAPILLARY: 105 mg/dL — AB (ref 65–99)
GLUCOSE-CAPILLARY: 97 mg/dL (ref 65–99)
Glucose-Capillary: 142 mg/dL — ABNORMAL HIGH (ref 65–99)
Glucose-Capillary: 88 mg/dL (ref 65–99)

## 2016-05-12 LAB — HEPATITIS B SURFACE ANTIGEN: HEP B S AG: NEGATIVE

## 2016-05-12 LAB — HEPATITIS B SURFACE ANTIBODY,QUALITATIVE: Hep B S Ab: REACTIVE

## 2016-05-12 LAB — HEPATITIS B CORE ANTIBODY, TOTAL: HEP B C TOTAL AB: POSITIVE — AB

## 2016-05-12 MED ORDER — LOSARTAN POTASSIUM 25 MG PO TABS: 25.0000 mg | ORAL_TABLET | Freq: Every day | ORAL | 0 refills | Status: DC

## 2016-05-12 MED ORDER — EPOETIN ALFA 4000 UNIT/ML IJ SOLN
4000.0000 [IU] | INTRAMUSCULAR | Status: DC
  Administered 2016-05-12: 4000 [IU] via INTRAVENOUS
  Filled 2016-05-12: qty 1

## 2016-05-12 MED ORDER — METOPROLOL TARTRATE 50 MG PO TABS
50.0000 mg | ORAL_TABLET | Freq: Two times a day (BID) | ORAL | Status: DC
  Administered 2016-05-12: 50 mg via ORAL
  Filled 2016-05-12: qty 1

## 2016-05-12 MED ORDER — LOSARTAN POTASSIUM 50 MG PO TABS
100.0000 mg | ORAL_TABLET | Freq: Every day | ORAL | Status: DC
  Administered 2016-05-12: 100 mg via ORAL
  Filled 2016-05-12: qty 2

## 2016-05-12 MED ORDER — METOPROLOL TARTRATE 50 MG PO TABS: 50.0000 mg | ORAL_TABLET | Freq: Two times a day (BID) | ORAL | 0 refills | Status: DC

## 2016-05-12 NOTE — Progress Notes (Signed)
Post hd vitals 

## 2016-05-12 NOTE — Discharge Instructions (Signed)
Heart Failure Clinic appointment on May 21, 2016 at 9:00am with [name], [place]. Please call [phone] to reschedule.

## 2016-05-12 NOTE — Progress Notes (Signed)
Jasmine Holloway is a 55 y.o. female  [id number]  Primary Cardiologist: Neoma Laming Reason for Consultation: Chest pain and CHF  HPI: This is a 55 year old African-American female with a past medical history of normal coronaries on cardiac catheterization done in June of this year presented with chest pain and shortness of breath orthopnea and PND.   Review of Systems: Patient does have chest pain and some orthopnea PND and leg swelling.   Past Medical History:  Diagnosis Date  . Asthma   . COPD (chronic obstructive pulmonary disease) ([place])   . Diabetes mellitus without complication ([place])   . Diastolic heart failure ([place])   . ESRD (end stage renal disease) on dialysis ([name])   . Hypertension   . Neuropathy ([place])   . Polycystic kidney disease   . Renal insufficiency     Medications Prior to Admission  Medication Sig Dispense Refill  . acetaminophen (TYLENOL) 325 MG tablet Take 2 tablets (650 mg total) by mouth every 6 (six) hours as needed for mild pain or headache.    . albuterol (PROVENTIL HFA;VENTOLIN HFA) 108 (90 Base) MCG/ACT inhaler Inhale 2 puffs into the lungs every 6 (six) hours as needed for wheezing or shortness of breath. 3 Inhaler 3  . amLODipine (NORVASC) 10 MG tablet Take 10 mg by mouth daily.    Marland Kitchen aspirin 81 MG chewable tablet Chew 1 tablet (81 mg total) by mouth daily. 30 tablet 2  . b complex-vitamin c-folic acid (NEPHRO-VITE) 0.8 MG TABS tablet Take 1 tablet by mouth at bedtime.    . calcium acetate (PHOSLO) 667 MG capsule Take 667 mg by mouth 3 (three) times daily with meals.     . cloNIDine (CATAPRES) 0.1 MG tablet Take 1 tablet (0.1 mg total) by mouth 2 (two) times daily. 60 tablet 2  . furosemide (LASIX) 80 MG tablet Take 1 tablet (80 mg total) by mouth daily. 90 tablet 3  . gabapentin (NEURONTIN) 300 MG capsule Take 300 mg by mouth daily.     . metoprolol succinate (TOPROL-XL) 50 MG 24 hr tablet Take 50 mg by mouth daily.     Marland Kitchen omeprazole (PRILOSEC) 20 MG  capsule Take 20 mg by mouth 2 (two) times daily.     . rosuvastatin (CRESTOR) 5 MG tablet Take 5 mg by mouth at bedtime.     Marland Kitchen oxyCODONE-acetaminophen (PERCOCET) 10-325 MG tablet Take 0.5-1 tablets by mouth daily as needed for pain.       Marland Kitchen amLODipine  10 mg Oral Daily  . aspirin  81 mg Oral Daily  . calcium acetate  667 mg Oral TID WC  . cloNIDine  0.1 mg Oral BID  . gabapentin  300 mg Oral Daily  . heparin  5,000 Units Subcutaneous Q8H  . insulin aspart  0-9 Units Subcutaneous TID WC  . mouth rinse  15 mL Mouth Rinse BID  . metoprolol  2.5 mg Intravenous Once  . metoprolol succinate  50 mg Oral Daily  . multivitamin  1 tablet Oral QHS  . pantoprazole  40 mg Oral Daily  . rosuvastatin  5 mg Oral QHS    Infusions:    Allergies  Allergen Reactions  . Morphine And Related Nausea And Vomiting  . Vicodin [Hydrocodone-Acetaminophen] Nausea And Vomiting  . Buprenorphine Hcl Nausea And Vomiting  . Codeine Nausea And Vomiting    Social History   Social History  . Marital status: Single    Spouse name: N/A  . Number of  children: N/A  . Years of education: N/A   Occupational History  . disabled    Social History Main Topics  . Smoking status: Former Smoker    Packs/day: 0.25    Quit date: 08/11/2015  . Smokeless tobacco: Never Used  . Alcohol use No  . Drug use: No  . Sexual activity: Not on file   Other Topics Concern  . Not on file   Social History Narrative   Lives alone    Family History  Problem Relation Age of Onset  . Hypertension Brother   . Heart failure Brother     PHYSICAL EXAM: Vitals:   05/12/16 0526 05/12/16 0714  BP: (!) 155/106 (!) 164/86  Pulse: 87 80  Resp:    Temp:       Intake/Output Summary (Last 24 hours) at 05/12/16 0832 Last data filed at 05/12/16 0715  Gross per 24 hour  Intake              240 ml  Output             2500 ml  Net            -2260 ml    General:  Well appearing. No respiratory difficulty HEENT:  normal Neck: supple. no JVD. Carotids 2+ bilat; no bruits. No lymphadenopathy or thryomegaly appreciated. Cor: PMI nondisplaced. Regular rate & rhythm. No rubs, gallops or murmurs. Lungs: clear Abdomen: soft, nontender, nondistended. No hepatosplenomegaly. No bruits or masses. Good bowel sounds. Extremities: no cyanosis, clubbing, rash, edema Neuro: alert & oriented x 3, cranial nerves grossly intact. moves all 4 extremities w/o difficulty. Affect pleasant.  WPY:KDXIP tachycardia with left atrial enlargement and poor R-wave progression and frequent PVCs  Results for orders placed or performed during the hospital encounter of 05/11/16 (from the past 24 hour(s))  MRSA PCR Screening     Status: None   Collection Time: 05/11/16 10:26 AM  Result Value Ref Range   MRSA by PCR NEGATIVE NEGATIVE  Glucose, capillary     Status: None   Collection Time: 05/11/16 11:01 AM  Result Value Ref Range   Glucose-Capillary 96 65 - 99 mg/dL  Hepatitis B surface antigen     Status: None   Collection Time: 05/11/16  3:23 PM  Result Value Ref Range   Hepatitis B Surface Ag Negative Negative  Glucose, capillary     Status: Abnormal   Collection Time: 05/11/16  9:12 PM  Result Value Ref Range   Glucose-Capillary 142 (H) 65 - 99 mg/dL  Basic metabolic panel     Status: Abnormal   Collection Time: 05/12/16  4:07 AM  Result Value Ref Range   Sodium 142 135 - 145 mmol/L   Potassium 4.5 3.5 - 5.1 mmol/L   Chloride 102 101 - 111 mmol/L   CO2 33 (H) 22 - 32 mmol/L   Glucose, Bld 97 65 - 99 mg/dL   BUN 31 (H) 6 - 20 mg/dL   Creatinine, Ser 7.11 (H) 0.44 - 1.00 mg/dL   Calcium 8.2 (L) 8.9 - 10.3 mg/dL   GFR calc non Af Amer 6 (L) >60 mL/min   GFR calc Af Amer 7 (L) >60 mL/min   Anion gap 7 5 - 15  CBC     Status: Abnormal   Collection Time: 05/12/16  4:07 AM  Result Value Ref Range   WBC 8.7 3.6 - 11.0 K/uL   RBC 3.35 (L) 3.80 - 5.20 MIL/uL   Hemoglobin 9.7 (L)  12.0 - 16.0 g/dL   HCT 29.3 (L) 35.0 - 47.0  %   MCV 87.4 80.0 - 100.0 fL   MCH 29.1 26.0 - 34.0 pg   MCHC 33.3 32.0 - 36.0 g/dL   RDW 22.5 (H) 11.5 - 14.5 %   Platelets 272 150 - 440 K/uL   Dg Chest Portable 1 View  Result Date: 05/11/2016 CLINICAL DATA:  Shortness of breath and productive cough. EXAM: PORTABLE CHEST 1 VIEW COMPARISON:  04/28/2016 FINDINGS: Cardiac enlargement with pulmonary vascular congestion and diffuse perihilar interstitial infiltrates consistent with edema. Small bilateral pleural effusions. Linear scarring in the left mid lung. Surgical clips in the left axilla. No pneumothorax. Calcification of the aorta. IMPRESSION: Cardiac enlargement with pulmonary vascular congestion, interstitial edema, and small bilateral pleural effusions likely indicating congestive failure. Progression since previous study. Electronically Signed   By: Lucienne Capers M.D.   On: 05/11/2016 06:58     ASSESSMENT AND PLAN:Congestive heart failure due to diastolic dysfunction with left ventricular ejection fraction being normal on echocardiogram in June of this year with LVH and diastolic dysfunction. Cardiac catheterization this year in June also showed normal coronaries thus chest pain is probably noncardiac. Patient needs to have dialysis and that's what probably will improve her shortness of breath-as she is fluid overloaded. No further cardiac workup is needed.  Jlynn Ly A

## 2016-05-12 NOTE — Progress Notes (Signed)
Pre hd info, report from rn, ccmd notified

## 2016-05-12 NOTE — Progress Notes (Signed)
Hd tx start

## 2016-05-12 NOTE — Progress Notes (Signed)
Pt A and O x 4. VSS. Pt tolerating diet well. No complaints of pain or nausea. IV removed by patient. Pt voiced understanding of discharge instructions with no further questions. Pt discharged via wheelchair with nurse aide. Patient is driving herself home. Pt has not had pain medications in the last 12 hour shift.

## 2016-05-12 NOTE — Progress Notes (Signed)
Subjective:   Seen and examined on hemodialysis. Tolerating treatment well. UF goal of 2.5 litres Dialysis yesterday emergently for flash pulmonary edema and respiratory failure requiring NIV. UF 2.5    HEMODIALYSIS FLOWSHEET:  Blood Flow Rate (mL/min): 400 mL/min Arterial Pressure (mmHg): -130 mmHg Venous Pressure (mmHg): 220 mmHg Transmembrane Pressure (mmHg): 30 mmHg Ultrafiltration Rate (mL/min): 1000 mL/min Dialysate Flow Rate (mL/min): 600 ml/min Conductivity: Machine : 14 Conductivity: Machine : 14 Dialysis Fluid Bolus: Normal Saline Bolus Amount (mL): 250 mL (prime) Dialysate Change:  (3k) Intra-Hemodialysis Comments: 907. pt resting, no c/o, bp high, other vss.     Objective:  Vital signs in last 24 hours:  Temp:  [97.9 F (36.6 C)-98.9 F (37.2 C)] 98.7 F (37.1 C) (10/03 0959) Pulse Rate:  [68-91] 86 (10/03 1100) Resp:  [14-31] 22 (10/03 1100) BP: (146-186)/(82-108) 171/107 (10/03 1100) SpO2:  [90 %-100 %] 94 % (10/03 1100) Weight:  [80.2 kg (176 lb 12.9 oz)-83.1 kg (183 lb 3.2 oz)] 83.1 kg (183 lb 3.2 oz) (10/03 0959)  Weight change: 0.599 kg (1 lb 5.1 oz) Filed Weights   05/11/16 1025 05/11/16 1838 05/12/16 0959  Weight: 82.7 kg (182 lb 5.1 oz) 80.2 kg (176 lb 12.9 oz) 83.1 kg (183 lb 3.2 oz)    Intake/Output:    Intake/Output Summary (Last 24 hours) at 05/12/16 1123 Last data filed at 05/12/16 0715  Gross per 24 hour  Intake              240 ml  Output             2500 ml  Net            -2260 ml     Physical Exam: General: No acute distress, lying in the bed  HEENT anicteric  Neck supple  Pulm/lungs Clear, breathing room air  CVS/Heart No rub or gallop  Abdomen:  Soft, nontender, nondistended  Extremities: Trace peripheral edema  Neurologic: Alert, oriented  Skin: No acute rashes  Access: AV graft       Basic Metabolic Panel:   Recent Labs Lab 05/11/16 0621 05/12/16 0407 05/12/16 0900  NA 141 142 140  K 4.9 4.5 4.7  CL 101  102 100*  CO2 28 33* 30  GLUCOSE 106* 97 95  BUN 56* 31* 34*  CREATININE 10.41* 7.11* 7.88*  CALCIUM 8.4* 8.2* 8.3*  PHOS  --   --  3.7     CBC:  Recent Labs Lab 05/11/16 0621 05/12/16 0407 05/12/16 0900  WBC 10.3 8.7 9.3  HGB 10.1* 9.7* 9.9*  HCT 30.4* 29.3* 29.8*  MCV 87.4 87.4 87.7  PLT 289 272 273      Microbiology:  Recent Results (from the past 720 hour(s))  MRSA PCR Screening     Status: None   Collection Time: 05/11/16 10:26 AM  Result Value Ref Range Status   MRSA by PCR NEGATIVE NEGATIVE Final    Comment:        The GeneXpert MRSA Assay (FDA approved for NASAL specimens only), is one component of a comprehensive MRSA colonization surveillance program. It is not intended to diagnose MRSA infection nor to guide or monitor treatment for MRSA infections.     Coagulation Studies: No results for input(s): LABPROT, INR in the last 72 hours.  Urinalysis: No results for input(s): COLORURINE, LABSPEC, PHURINE, GLUCOSEU, HGBUR, BILIRUBINUR, KETONESUR, PROTEINUR, UROBILINOGEN, NITRITE, LEUKOCYTESUR in the last 72 hours.  Invalid input(s): APPERANCEUR    Imaging: Dg Chest Portable  1 View  Result Date: 05/11/2016 CLINICAL DATA:  Shortness of breath and productive cough. EXAM: PORTABLE CHEST 1 VIEW COMPARISON:  04/28/2016 FINDINGS: Cardiac enlargement with pulmonary vascular congestion and diffuse perihilar interstitial infiltrates consistent with edema. Small bilateral pleural effusions. Linear scarring in the left mid lung. Surgical clips in the left axilla. No pneumothorax. Calcification of the aorta. IMPRESSION: Cardiac enlargement with pulmonary vascular congestion, interstitial edema, and small bilateral pleural effusions likely indicating congestive failure. Progression since previous study. Electronically Signed   By: Lucienne Capers M.D.   On: 05/11/2016 06:58     Medications:     . amLODipine  10 mg Oral Daily  . aspirin  81 mg Oral Daily  .  calcium acetate  667 mg Oral TID WC  . cloNIDine  0.1 mg Oral BID  . gabapentin  300 mg Oral Daily  . heparin  5,000 Units Subcutaneous Q8H  . insulin aspart  0-9 Units Subcutaneous TID WC  . losartan  100 mg Oral Daily  . mouth rinse  15 mL Mouth Rinse BID  . metoprolol  2.5 mg Intravenous Once  . metoprolol tartrate  50 mg Oral BID  . multivitamin  1 tablet Oral QHS  . pantoprazole  40 mg Oral Daily  . rosuvastatin  5 mg Oral QHS   oxyCODONE **AND** acetaminophen, albuterol  Assessment/ Plan:  55 y.o.  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   1. End Stage Renal Disease: requiring emergent hemodialysis yesterday. Seen and examined on hemodialysis. Tolerating treatment well. Second treatment. UF goal of 2.5 litres - 3 litres.  - Monitor daily for dialysis need.   2. Anemia of chronic kidney disease: hemoglobin 10.1 - epo on TTS treatments.   3.  Acute pulmonary edema: echo from 6/3 without reduced ejection fraction  4. Hypertension: elevated - amlodipine, clonidine and metoprolol - restarted on losartan today  5. Secondary Hyperparathyroidism: with hyperphosphatemia. outpatient PTH elevated at 711. Outpatient phosphorus of 7.5 - calcium acetate with meals.    LOS: [place], [place] 10/3/201711:23 AM

## 2016-05-12 NOTE — Progress Notes (Signed)
  End of hd 

## 2016-05-12 NOTE — Progress Notes (Signed)
Pre hd assessment  

## 2016-05-12 NOTE — Progress Notes (Signed)
Post hd assessment 

## 2016-05-12 NOTE — Care Management (Signed)
Patient to discharge today.  Patient has been weaned to RA.  No RNCM needs identified.

## 2016-05-15 NOTE — Discharge Summary (Signed)
[facility] [place] NAME: Jasmine Holloway    MR#:  [id number]  DATE OF BIRTH:  [birth year]  DATE OF ADMISSION:  05/11/2016 ADMITTING PHYSICIAN: Vaughan Basta, MD  DATE OF DISCHARGE: 05/12/2016  3:26 PM  PRIMARY CARE PHYSICIAN: Elyse Jarvis, MD    ADMISSION DIAGNOSIS:  Acute pulmonary edema ([place]) [J81.0] SOB (shortness of breath) [R06.02]  DISCHARGE DIAGNOSIS:  Active Problems:   Acute on chronic diastolic CHF (congestive heart failure) (HCC)   Fluid overload, ESRD  SECONDARY DIAGNOSIS:   Past Medical History:  Diagnosis Date  . Asthma   . COPD (chronic obstructive pulmonary disease) ([place])   . Diabetes mellitus without complication ([place])   . Diastolic heart failure ([place])   . ESRD (end stage renal disease) on dialysis ([place])   . Hypertension   . Neuropathy ([place])   . Polycystic kidney disease   . Renal insufficiency     HOSPITAL COURSE:   * Acute respi failure with fluid overload secondary to Ac on ch diastolic heart failure.   She have ESRD and on HD.    Bipap initially, but felt better after HD for 2 consecutive days.   Give IV lasix as she is also getting oral lasix at home.  * Hypertension   COnt home meds ,   * DM   Keep on ISS.  * COPD   No active wheezing, cont inhalers.  DISCHARGE CONDITIONS:   Stable.  CONSULTS OBTAINED:  Treatment Team:  Lavonia Dana, MD Dionisio David, MD  DRUG ALLERGIES:   Allergies  Allergen Reactions  . Morphine And Related Nausea And Vomiting  . Vicodin [Hydrocodone-Acetaminophen] Nausea And Vomiting  . Buprenorphine Hcl Nausea And Vomiting  . Codeine Nausea And Vomiting    DISCHARGE MEDICATIONS:   Discharge Medication List as of 05/12/2016  3:10 PM    START taking these medications   Details  losartan (COZAAR) 25 MG tablet Take 1 tablet (25 mg total) by mouth daily., Starting Wed 05/13/2016, Print    metoprolol (LOPRESSOR) 50 MG tablet Take 1 tablet  (50 mg total) by mouth 2 (two) times daily., Starting Tue 05/12/2016, Print      CONTINUE these medications which have NOT CHANGED   Details  acetaminophen (TYLENOL) 325 MG tablet Take 2 tablets (650 mg total) by mouth every 6 (six) hours as needed for mild pain or headache., Starting Wed 03/18/2016, OTC    albuterol (PROVENTIL HFA;VENTOLIN HFA) 108 (90 Base) MCG/ACT inhaler Inhale 2 puffs into the lungs every 6 (six) hours as needed for wheezing or shortness of breath., Starting Thu 03/05/2016, Until Wed 06/03/2016, Normal    amLODipine (NORVASC) 10 MG tablet Take 10 mg by mouth daily., Historical Med    aspirin 81 MG chewable tablet Chew 1 tablet (81 mg total) by mouth daily., Starting Fri 01/17/2016, Normal    b complex-vitamin c-folic acid (NEPHRO-VITE) 0.8 MG TABS tablet Take 1 tablet by mouth at bedtime., Historical Med    calcium acetate (PHOSLO) 667 MG capsule Take 667 mg by mouth 3 (three) times daily with meals. , Historical Med    cloNIDine (CATAPRES) 0.1 MG tablet Take 1 tablet (0.1 mg total) by mouth 2 (two) times daily., Starting Fri 01/17/2016, Normal    furosemide (LASIX) 80 MG tablet Take 1 tablet (80 mg total) by mouth daily., Starting Thu 03/05/2016, Until Wed 06/03/2016, Normal    gabapentin (NEURONTIN) 300 MG capsule Take 300 mg by mouth daily. , Historical  Med    omeprazole (PRILOSEC) 20 MG capsule Take 20 mg by mouth 2 (two) times daily. , Historical Med    oxyCODONE-acetaminophen (PERCOCET) 10-325 MG tablet Take 0.5-1 tablets by mouth daily as needed for pain., Starting Wed 03/18/2016, Historical Med    rosuvastatin (CRESTOR) 5 MG tablet Take 5 mg by mouth at bedtime. , Historical Med      STOP taking these medications     metoprolol succinate (TOPROL-XL) 50 MG 24 hr tablet          DISCHARGE INSTRUCTIONS:    Follow with PMD in 1-2 weeks.  If you experience worsening of your admission symptoms, develop shortness of breath, life threatening emergency, suicidal  or homicidal thoughts you must seek medical attention immediately by calling 911 or calling your MD immediately  if symptoms less severe.  You Must read complete instructions/literature along with all the possible adverse reactions/side effects for all the Medicines you take and that have been prescribed to you. Take any new Medicines after you have completely understood and accept all the possible adverse reactions/side effects.   Please note  You were cared for by a hospitalist during your hospital stay. If you have any questions about your discharge medications or the care you received while you were in the hospital after you are discharged, you can call the unit and asked to speak with the hospitalist on call if the hospitalist that took care of you is not available. Once you are discharged, your primary care physician will handle any further medical issues. Please note that NO REFILLS for any discharge medications will be authorized once you are discharged, as it is imperative that you return to your primary care physician (or establish a relationship with a primary care physician if you do not have one) for your aftercare needs so that they can reassess your need for medications and monitor your lab values.    Today   CHIEF COMPLAINT:   Chief Complaint  Patient presents with  . Shortness of Breath    HISTORY OF PRESENT ILLNESS:  Jasmine Holloway  is a 55 y.o. female with a known history of Asthma, COPD, DM, ESRD on HD on T,T,S, Htn- recent admission for fluid overload. Regular on HD- last was on Saturday, regular, still from Sunday started to feel more SOB, increasing with minimal exertion,and today morning was very SOb- came to ER- Started on bipap- felt better. Nephro called in for getting urgent HD. Denies fever/ chills. Have cough with clear sputum, no chest pain but have tightness.   VITAL SIGNS:  Blood pressure (!) 163/92, pulse 69, temperature 97.7 F (36.5 C), temperature source  Axillary, resp. rate 20, height 5\' 3"  (1.6 m), weight 80.5 kg (177 lb 7.5 oz), SpO2 98 %.  I/O:  No intake or output data in the 24 hours ending 05/15/16 1558  PHYSICAL EXAMINATION:  GENERAL:  55 y.o.-year-old patient lying in the bed with no acute distress.  EYES: Pupils equal, round, reactive to light and accommodation. No scleral icterus. Extraocular muscles intact.  HEENT: Head atraumatic, normocephalic. Oropharynx and nasopharynx clear.  NECK:  Supple, no jugular venous distention. No thyroid enlargement, no tenderness.  LUNGS: Normal breath sounds bilaterally, no wheezing, rales,rhonchi or crepitation. No use of accessory muscles of respiration.  CARDIOVASCULAR: S1, S2 normal. No murmurs, rubs, or gallops.  ABDOMEN: Soft, non-tender, non-distended. Bowel sounds present. No organomegaly or mass.  EXTREMITIES: No pedal edema, cyanosis, or clubbing.  NEUROLOGIC: Cranial nerves II through XII  are intact. Muscle strength 5/5 in all extremities. Sensation intact. Gait not checked.  PSYCHIATRIC: The patient is alert and oriented x 3.  SKIN: No obvious rash, lesion, or ulcer.   DATA REVIEW:   CBC  Recent Labs Lab 05/12/16 0900  WBC 9.3  HGB 9.9*  HCT 29.8*  PLT 273    Chemistries   Recent Labs Lab 05/11/16 0621  05/12/16 0900  NA 141  < > 140  K 4.9  < > 4.7  CL 101  < > 100*  CO2 28  < > 30  GLUCOSE 106*  < > 95  BUN 56*  < > 34*  CREATININE 10.41*  < > 7.88*  CALCIUM 8.4*  < > 8.3*  AST 49*  --   --   ALT 39  --   --   ALKPHOS 71  --   --   BILITOT 0.6  --   --   < > = values in this interval not displayed.  Cardiac Enzymes  Recent Labs Lab 05/11/16 0621  TROPONINI 0.05*    Microbiology Results  Results for orders placed or performed during the hospital encounter of 05/11/16  MRSA PCR Screening     Status: None   Collection Time: 05/11/16 10:26 AM  Result Value Ref Range Status   MRSA by PCR NEGATIVE NEGATIVE Final    Comment:        The GeneXpert  MRSA Assay (FDA approved for NASAL specimens only), is one component of a comprehensive MRSA colonization surveillance program. It is not intended to diagnose MRSA infection nor to guide or monitor treatment for MRSA infections.     RADIOLOGY:  No results found.  EKG:   Orders placed or performed during the hospital encounter of 05/11/16  . EKG 12-Lead  . EKG 12-Lead  . ED EKG  . ED EKG  . EKG      Management plans discussed with the patient, family and they are in agreement.  CODE STATUS:  Code Status History    Date Active Date Inactive Code Status Order ID Comments User Context   05/11/2016 10:24 AM 05/12/2016  6:26 PM Full Code [id number]  Vaughan Basta, MD Inpatient   04/28/2016  8:30 PM 04/30/2016  5:49 PM Full Code [id number]  Gladstone Lighter, MD Inpatient   03/28/2016  6:25 PM 03/29/2016  5:26 PM Full Code [id number]  Vaughan Basta, MD ED   03/17/2016  6:06 AM 03/18/2016  7:51 AM Full Code [id number]  Saundra Shelling, MD ED   01/10/2016  9:11 PM 01/17/2016  9:36 PM Full Code [id number]  Mikael Spray, NP ED   12/21/2015  6:08 AM 12/22/2015  4:09 PM Full Code [id number]  Saundra Shelling, MD Inpatient   02/24/2015  3:52 AM 02/24/2015  4:15 PM Full Code [id number]  Harrie Foreman, MD Inpatient    Advance Directive Documentation   Flowsheet Row Most Recent Value  Type of Advance Directive  Healthcare Power of Attorney  Pre-existing out of facility DNR order (yellow form or pink MOST form)  No data  "MOST" Form in Place?  No data      TOTAL TIME TAKING CARE OF THIS PATIENT: 35 minutes.    Vaughan Basta M.D on 05/15/2016 at 3:58 PM  Between 7am to 6pm - Pager - [phone]  After 6pm go to [email or web address] - password [email or web address] [facility]  Office  [phone]  CC: Primary care physician; Elyse Jarvis, MD   Note: This  dictation was prepared with Dragon dictation along with smaller phrase technology. Any transcriptional errors  that result from this process are unintentional.

## 2016-05-21 ENCOUNTER — Ambulatory Visit: Payer: Medicare Other | Admitting: Family

## 2016-06-01 ENCOUNTER — Ambulatory Visit: Payer: Medicare Other | Admitting: Family

## 2016-06-01 ENCOUNTER — Telehealth: Payer: Self-pay | Admitting: Family

## 2016-06-01 NOTE — Telephone Encounter (Signed)
Patient did not show for her Heart Failure Clinic appointment on 06/01/16. Will attempt to reschedule.

## 2016-06-08 ENCOUNTER — Ambulatory Visit (INDEPENDENT_AMBULATORY_CARE_PROVIDER_SITE_OTHER): Payer: Medicare Other | Admitting: Cardiovascular Disease

## 2016-06-08 ENCOUNTER — Encounter: Payer: Self-pay | Admitting: Cardiovascular Disease

## 2016-06-08 VITALS — BP 140/82 | HR 62 | Ht 63.0 in | Wt 185.8 lb

## 2016-06-08 DIAGNOSIS — R0789 Other chest pain: Secondary | ICD-10-CM

## 2016-06-08 DIAGNOSIS — I10 Essential (primary) hypertension: Secondary | ICD-10-CM | POA: Diagnosis not present

## 2016-06-08 DIAGNOSIS — D649 Anemia, unspecified: Secondary | ICD-10-CM | POA: Insufficient documentation

## 2016-06-08 DIAGNOSIS — N184 Chronic kidney disease, stage 4 (severe): Secondary | ICD-10-CM

## 2016-06-08 DIAGNOSIS — G894 Chronic pain syndrome: Secondary | ICD-10-CM | POA: Insufficient documentation

## 2016-06-08 DIAGNOSIS — N186 End stage renal disease: Secondary | ICD-10-CM

## 2016-06-08 DIAGNOSIS — D631 Anemia in chronic kidney disease: Secondary | ICD-10-CM

## 2016-06-08 DIAGNOSIS — Z992 Dependence on renal dialysis: Secondary | ICD-10-CM

## 2016-06-08 DIAGNOSIS — I5032 Chronic diastolic (congestive) heart failure: Secondary | ICD-10-CM

## 2016-06-08 MED ORDER — METOLAZONE 5 MG PO TABS: 5.0000 mg | ORAL_TABLET | Freq: Every day | ORAL | 2 refills | Status: DC | PRN

## 2016-06-08 NOTE — Patient Instructions (Addendum)
Medication Instructions:   Please take lasix twice a day for days when you have shortness of breath  Please take metolazone with lasix as needed for severe shortness of breath and leg swelling  Labwork:  No new labs needed  Testing/Procedures:  No further testing at this time   Follow-Up: It was a pleasure seeing you in the office today. Please call us if you have new issues that need to be addressed before your next appt.  [phone]  Your physician wants you to follow-up in: 6 months.  You will receive a reminder letter in the mail two months in advance. If you don't receive a letter, please call our office to schedule the follow-up appointment.  If you need a refill on your cardiac medications before your next appointment, please call your pharmacy.

## 2016-06-08 NOTE — Progress Notes (Signed)
Cardiology Office Note  Date:  06/08/2016   ID:  Jasmine Holloway, DOB 11-19-1960, MRN [id number]  PCP:  Elyse Jarvis, MD   Chief Complaint  Patient presents with  . CDHF  . Hypertension    HPI:  Jasmine Holloway  is a 55 y.o. female with a known history of Asthma, COPD, DM, ESRD on HD on T,T,S, Htn , Polycystic kidney disease, Hospital admissions for fluid overload. On hemodialysis Tuesday, Thursday, Saturday  chronic left hand pain, ruptured AV access left arm, who presents to establish care in the [place] office. Self-referred for chest pain  Most recent hospital admission June 2017 for shortness of breath, started on BiPAP  she has chronic Anemia, HBG 9.9 (stable) Echo 01/11/2016 moderate LVH, ejection fraction not detailed in the report though presumed to be normal , fractional shortening suggesting normal EF  Catheterization by outside group 03/17/2016 showing no coronary disease   Her main complaint isCramping in chest, left arm in her left pectoral region, and are her left shoulder  She reports this is the side of her previous HD fistula  Currently has HD fistula right forearm,  She has chronic cramping and has severe pain left arm during HD, only has enough pain medication  for one pill a day. She does not know whether to take it before dialysis or after as she is concerned HD will clear the medication and she will have none for the rest the day but she has severe pain during hemodialysis   Left AV fistula not working, chronic pain ("blew out in [place]"), scar tissue,  Trouble moving fingers left hand  Seen by pain clinic for pain in her left pectoral, left arm,  reports she only has onepercocet/oxycodone provided per day . She does not feel this is enough . No other options were provided per the patient    she reports that HD was cut short on Saturday, she was having severe cramping They did not pull enough. Now getting short of breath today, short of breath last night Takes  lasix daily, 80 mg   Reports she is On transplant list for kidney   EKG on today's visit shows normal sinus rhythm with rate 59 bpm, LVH with repolarization abnormality   PMH:   has a past medical history of Asthma; COPD (chronic obstructive pulmonary disease) ([place]); Diabetes mellitus without complication ([place]); Diastolic heart failure ([place]); ESRD (end stage renal disease) on dialysis ([place]); Hypertension; Neuropathy ([place]); Polycystic kidney disease; and Renal insufficiency.  PSH:    Past Surgical History:  Procedure Laterality Date  . ARTERIOVENOUS GRAFT PLACEMENT Right    x3 (R forearm currently used for access)  . COLON SURGERY    . VEIN HARVEST      Current Outpatient Prescriptions  Medication Sig Dispense Refill  . acetaminophen (TYLENOL) 325 MG tablet Take 2 tablets (650 mg total) by mouth every 6 (six) hours as needed for mild pain or headache.    Marland Kitchen amLODipine (NORVASC) 10 MG tablet Take 10 mg by mouth daily.    Marland Kitchen aspirin 81 MG chewable tablet Chew 1 tablet (81 mg total) by mouth daily. 30 tablet 2  . b complex-vitamin c-folic acid (NEPHRO-VITE) 0.8 MG TABS tablet Take 1 tablet by mouth at bedtime.    . calcium acetate (PHOSLO) 667 MG capsule Take 667 mg by mouth 3 (three) times daily with meals.     . cloNIDine (CATAPRES) 0.1 MG tablet Take 1 tablet (0.1 mg total) by mouth 2 (two) times  daily. 60 tablet 2  . gabapentin (NEURONTIN) 300 MG capsule Take 300 mg by mouth daily.     Marland Kitchen losartan (COZAAR) 25 MG tablet Take 1 tablet (25 mg total) by mouth daily. 30 tablet 0  . metoprolol (LOPRESSOR) 50 MG tablet Take 1 tablet (50 mg total) by mouth 2 (two) times daily. 60 tablet 0  . omeprazole (PRILOSEC) 20 MG capsule Take 20 mg by mouth 2 (two) times daily.     Marland Kitchen oxyCODONE-acetaminophen (PERCOCET) 10-325 MG tablet Take 0.5-1 tablets by mouth daily as needed for pain.    . rosuvastatin (CRESTOR) 5 MG tablet Take 5 mg by mouth at bedtime.     Marland Kitchen albuterol (PROVENTIL HFA;VENTOLIN HFA) 108  (90 Base) MCG/ACT inhaler Inhale 2 puffs into the lungs every 6 (six) hours as needed for wheezing or shortness of breath. 3 Inhaler 3  . furosemide (LASIX) 80 MG tablet Take 1 tablet (80 mg total) by mouth daily. 90 tablet 3  . metolazone (ZAROXOLYN) 5 MG tablet Take 1 tablet (5 mg total) by mouth daily as needed. 30 tablet 2   No current facility-administered medications for this visit.      Allergies:   Morphine and related; Vicodin [hydrocodone-acetaminophen]; Buprenorphine hcl; and Codeine   Social History:  The patient  reports that she quit smoking about 9 months ago. She smoked 0.25 packs per day. She has never used smokeless tobacco. She reports that she does not drink alcohol or use drugs.   Family History:   family history includes Heart disease in her mother; Heart failure in her brother; Hypertension in her brother and mother.    Review of Systems: Review of Systems  Constitutional: Negative.   Respiratory: Positive for shortness of breath.   Cardiovascular: Negative.   Gastrointestinal: Negative.   Musculoskeletal: Positive for joint pain.       Arm pain, muscle cramps  Neurological: Negative.   Psychiatric/Behavioral: Negative.   All other systems reviewed and are negative.    PHYSICAL EXAM: VS:  BP 140/82   Pulse 62   Ht 5\' 3"  (1.6 m)   Wt 185 lb 12.8 oz (84.3 kg)   SpO2 93%   BMI 32.91 kg/m  , BMI Body mass index is 32.91 kg/m. GEN: Well nourished, well developed, in no acute distress , talkative, obese  HEENT: normal  Neck: no JVD, carotid bruits, or masses Cardiac: RRR; no murmurs, rubs, or gallops, trace edema  Respiratory:  clear to auscultation bilaterally, normal work of breathing GI: soft, nontender, nondistended, + BS MS: no deformity or atrophy  Skin: warm and dry, no rash Neuro:  Strength and sensation are intact Psych: euthymic mood, full affect    Recent Labs: 01/13/2016: Magnesium 2.1 05/11/2016: ALT 39; B Natriuretic Peptide  2,105.0 05/12/2016: BUN 34; Creatinine, Ser 7.88; Hemoglobin 9.9; Platelets 273; Potassium 4.7; Sodium 140    Lipid Panel Lab Results  Component Value Date   CHOL 151 03/17/2016   HDL 57 03/17/2016   LDLCALC 85 03/17/2016   TRIG 46 03/17/2016      Wt Readings from Last 3 Encounters:  06/08/16 185 lb 12.8 oz (84.3 kg)  05/12/16 177 lb 7.5 oz (80.5 kg)  04/30/16 181 lb 3.5 oz (82.2 kg)       ASSESSMENT AND PLAN:  Chronic diastolic heart failure (HCC) - Plan: EKG 12-Lead Fluid status managed by hemodialysis. Hemodialysis periodically limited by severe cramping leading to shortness of breath the next few days. Currently having shortness of  breath last night, today. Recommended she take Lasix twice a day. For severe episodes, recommended she take with metolazone. 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 She sees the pain clinic, does not feel she has enough pain medication  ESRD on dialysis ([place]) 3 days per week, also makes urine, on Lasix  Chronic pain syndrome  Anemia in stage 4 chronic kidney disease (HCC) Chronic stable anemia, likely contributing to shortness of breath, leg swelling, chronic CHF symptoms   Total encounter time more than 45 minutes  Greater than 50% was spent in counseling and coordination of care with the patient    Disposition:   F/U  6 months   Orders Placed This Encounter  Procedures  . EKG 12-Lead     Signed, Esmond Plants, M.D., Ph.D. 06/08/2016  [place], [place]

## 2016-06-09 ENCOUNTER — Encounter: Payer: Self-pay | Admitting: Emergency Medicine

## 2016-06-09 ENCOUNTER — Inpatient Hospital Stay
Admission: EM | Admit: 2016-06-09 | Discharge: 2016-06-10 | DRG: 208 | Disposition: A | Discharge status: Home / Self Care | Payer: Medicare Other | Attending: Internal Medicine | Admitting: Internal Medicine

## 2016-06-09 ENCOUNTER — Emergency Department: Payer: Medicare Other

## 2016-06-09 ENCOUNTER — Inpatient Hospital Stay: Payer: Medicare Other

## 2016-06-09 DIAGNOSIS — Q613 Polycystic kidney, unspecified: Secondary | ICD-10-CM | POA: Diagnosis not present

## 2016-06-09 DIAGNOSIS — Z87891 Personal history of nicotine dependence: Secondary | ICD-10-CM

## 2016-06-09 DIAGNOSIS — J449 Chronic obstructive pulmonary disease, unspecified: Secondary | ICD-10-CM | POA: Diagnosis present

## 2016-06-09 DIAGNOSIS — Z79899 Other long term (current) drug therapy: Secondary | ICD-10-CM | POA: Diagnosis not present

## 2016-06-09 DIAGNOSIS — Z992 Dependence on renal dialysis: Secondary | ICD-10-CM

## 2016-06-09 DIAGNOSIS — K219 Gastro-esophageal reflux disease without esophagitis: Secondary | ICD-10-CM | POA: Diagnosis present

## 2016-06-09 DIAGNOSIS — N186 End stage renal disease: Secondary | ICD-10-CM | POA: Diagnosis present

## 2016-06-09 DIAGNOSIS — N2581 Secondary hyperparathyroidism of renal origin: Secondary | ICD-10-CM | POA: Diagnosis present

## 2016-06-09 DIAGNOSIS — J9600 Acute respiratory failure, unspecified whether with hypoxia or hypercapnia: Secondary | ICD-10-CM | POA: Diagnosis present

## 2016-06-09 DIAGNOSIS — I5032 Chronic diastolic (congestive) heart failure: Secondary | ICD-10-CM

## 2016-06-09 DIAGNOSIS — D631 Anemia in chronic kidney disease: Secondary | ICD-10-CM | POA: Diagnosis present

## 2016-06-09 DIAGNOSIS — J9601 Acute respiratory failure with hypoxia: Secondary | ICD-10-CM | POA: Diagnosis not present

## 2016-06-09 DIAGNOSIS — R0602 Shortness of breath: Secondary | ICD-10-CM | POA: Diagnosis present

## 2016-06-09 DIAGNOSIS — I161 Hypertensive emergency: Secondary | ICD-10-CM | POA: Diagnosis present

## 2016-06-09 DIAGNOSIS — J9621 Acute and chronic respiratory failure with hypoxia: Secondary | ICD-10-CM | POA: Diagnosis present

## 2016-06-09 DIAGNOSIS — J810 Acute pulmonary edema: Secondary | ICD-10-CM

## 2016-06-09 DIAGNOSIS — I132 Hypertensive heart and chronic kidney disease with heart failure and with stage 5 chronic kidney disease, or end stage renal disease: Secondary | ICD-10-CM | POA: Diagnosis present

## 2016-06-09 DIAGNOSIS — R0902 Hypoxemia: Secondary | ICD-10-CM

## 2016-06-09 DIAGNOSIS — Z7982 Long term (current) use of aspirin: Secondary | ICD-10-CM

## 2016-06-09 LAB — BLOOD GAS, ARTERIAL
ACID-BASE EXCESS: 3.6 mmol/L — AB (ref 0.0–2.0)
Acid-base deficit: 0.6 mmol/L (ref 0.0–2.0)
Bicarbonate: 28.1 mmol/L — ABNORMAL HIGH (ref 20.0–28.0)
Bicarbonate: 27.7 mmol/L (ref 20.0–28.0)
FIO2: 0.5
FIO2: 0.5
MECHVT: 450 mL
MECHVT: 550 mL
Mechanical Rate: 18
O2 SAT: 94.1 %
O2 SAT: 99 %
PCO2 ART: 67 mmHg — AB (ref 32.0–48.0)
PCO2 ART: 39 mmHg (ref 32.0–48.0)
PEEP: 5 cmH2O
PEEP: 5 cmH2O
PH ART: 7.46 — AB (ref 7.350–7.450)
PO2 ART: 84 mmHg (ref 83.0–108.0)
Patient temperature: 37
Patient temperature: 37
RATE: 18 resp/min
pH, Arterial: 7.23 — ABNORMAL LOW (ref 7.350–7.450)
pO2, Arterial: 127 mmHg — ABNORMAL HIGH (ref 83.0–108.0)

## 2016-06-09 LAB — CBC WITH DIFFERENTIAL/PLATELET
BASOS PCT: 3 %
Basophils Absolute: 0.2 10*3/uL — ABNORMAL HIGH (ref 0–0.1)
EOS ABS: 0.6 10*3/uL (ref 0–0.7)
Eosinophils Relative: 8 %
HEMATOCRIT: 31.7 % — AB (ref 35.0–47.0)
HEMOGLOBIN: 10.6 g/dL — AB (ref 12.0–16.0)
LYMPHS ABS: 1.3 10*3/uL (ref 1.0–3.6)
Lymphocytes Relative: 16 %
MCH: 29.3 pg (ref 26.0–34.0)
MCHC: 33.4 g/dL (ref 32.0–36.0)
MCV: 87.7 fL (ref 80.0–100.0)
MONOS PCT: 4 %
Monocytes Absolute: 0.3 10*3/uL (ref 0.2–0.9)
NEUTROS ABS: 5.5 10*3/uL (ref 1.4–6.5)
NEUTROS PCT: 69 %
Platelets: 314 10*3/uL (ref 150–440)
RBC: 3.62 MIL/uL — AB (ref 3.80–5.20)
RDW: 23.7 % — ABNORMAL HIGH (ref 11.5–14.5)
WBC: 8 10*3/uL (ref 3.6–11.0)

## 2016-06-09 LAB — BASIC METABOLIC PANEL
Anion gap: 12 (ref 5–15)
BUN: 76 mg/dL — ABNORMAL HIGH (ref 6–20)
CHLORIDE: 102 mmol/L (ref 101–111)
CO2: 29 mmol/L (ref 22–32)
Calcium: 8.2 mg/dL — ABNORMAL LOW (ref 8.9–10.3)
Creatinine, Ser: 11.95 mg/dL — ABNORMAL HIGH (ref 0.44–1.00)
GFR calc non Af Amer: 3 mL/min — ABNORMAL LOW (ref >60)
GFR, EST AFRICAN AMERICAN: 4 mL/min — AB (ref >60)
Glucose, Bld: 108 mg/dL — ABNORMAL HIGH (ref 65–99)
POTASSIUM: 4.8 mmol/L (ref 3.5–5.1)
SODIUM: 143 mmol/L (ref 135–145)

## 2016-06-09 LAB — GLUCOSE, CAPILLARY
GLUCOSE-CAPILLARY: 136 mg/dL — AB (ref 65–99)
GLUCOSE-CAPILLARY: 88 mg/dL (ref 65–99)
Glucose-Capillary: 250 mg/dL — ABNORMAL HIGH (ref 65–99)
Glucose-Capillary: 80 mg/dL (ref 65–99)
Glucose-Capillary: 117 mg/dL — ABNORMAL HIGH (ref 65–99)

## 2016-06-09 LAB — TROPONIN I
TROPONIN I: 0.04 ng/mL — AB (ref <0.03)
Troponin I: 0.04 ng/mL (ref <0.03)

## 2016-06-09 LAB — PHOSPHORUS: PHOSPHORUS: 7.2 mg/dL — AB (ref 2.5–4.6)

## 2016-06-09 LAB — MAGNESIUM: Magnesium: 2.2 mg/dL (ref 1.7–2.4)

## 2016-06-09 LAB — PROCALCITONIN: PROCALCITONIN: 0.83 ng/mL

## 2016-06-09 LAB — TRIGLYCERIDES: Triglycerides: 125 mg/dL (ref <150)

## 2016-06-09 LAB — BRAIN NATRIURETIC PEPTIDE: B Natriuretic Peptide: 1759 pg/mL — ABNORMAL HIGH (ref 0.0–100.0)

## 2016-06-09 MED ORDER — ACETAMINOPHEN 325 MG PO TABS
650.0000 mg | ORAL_TABLET | ORAL | Status: DC | PRN
  Administered 2016-06-09 – 2016-06-10 (×4): 650 mg via ORAL
  Filled 2016-06-09 (×4): qty 2

## 2016-06-09 MED ORDER — HYDRALAZINE HCL 20 MG/ML IJ SOLN
20.0000 mg | Freq: Once | INTRAMUSCULAR | Status: AC
  Administered 2016-06-09: 20 mg via INTRAVENOUS

## 2016-06-09 MED ORDER — IPRATROPIUM-ALBUTEROL 0.5-2.5 (3) MG/3ML IN SOLN
3.0000 mL | Freq: Four times a day (QID) | RESPIRATORY_TRACT | Status: DC
  Administered 2016-06-09 – 2016-06-10 (×6): 3 mL via RESPIRATORY_TRACT
  Filled 2016-06-09 (×6): qty 3

## 2016-06-09 MED ORDER — SODIUM CHLORIDE 0.9 % IV SOLN
1.0000 mg/h | INTRAVENOUS | Status: DC
Start: 2016-06-09 — End: 2016-06-09
  Filled 2016-06-09: qty 10

## 2016-06-09 MED ORDER — HYDRALAZINE HCL 20 MG/ML IJ SOLN: 10.0000 mg | INTRAMUSCULAR | Status: DC | PRN

## 2016-06-09 MED ORDER — ONDANSETRON HCL 4 MG/2ML IJ SOLN
4.0000 mg | Freq: Four times a day (QID) | INTRAMUSCULAR | Status: DC | PRN
  Administered 2016-06-09: 4 mg via INTRAVENOUS
  Filled 2016-06-09: qty 2

## 2016-06-09 MED ORDER — LORAZEPAM 2 MG/ML IJ SOLN
0.5000 mg | Freq: Once | INTRAMUSCULAR | Status: AC
  Administered 2016-06-09: 0.5 mg via INTRAVENOUS

## 2016-06-09 MED ORDER — ASPIRIN 300 MG RE SUPP: 300.0000 mg | RECTAL | Status: AC

## 2016-06-09 MED ORDER — METOPROLOL SUCCINATE ER 50 MG PO TB24
50.0000 mg | ORAL_TABLET | Freq: Every day | ORAL | Status: DC
  Administered 2016-06-09: 50 mg via ORAL
  Filled 2016-06-09: qty 1

## 2016-06-09 MED ORDER — ASPIRIN 81 MG PO CHEW: 324.0000 mg | CHEWABLE_TABLET | ORAL | Status: AC

## 2016-06-09 MED ORDER — CLONIDINE HCL 0.1 MG PO TABS
0.1000 mg | ORAL_TABLET | Freq: Two times a day (BID) | ORAL | Status: DC
  Administered 2016-06-10: 0.1 mg via ORAL
  Filled 2016-06-09: qty 1

## 2016-06-09 MED ORDER — PANTOPRAZOLE SODIUM 40 MG PO TBEC: 40.0000 mg | DELAYED_RELEASE_TABLET | Freq: Every day | ORAL | Status: DC

## 2016-06-09 MED ORDER — FENTANYL 2500MCG IN NS 250ML (10MCG/ML) PREMIX INFUSION
25.0000 ug/h | INTRAVENOUS | Status: DC
  Administered 2016-06-09 (×3): 100 ug/h via INTRAVENOUS
  Administered 2016-06-09: 25 ug/h via INTRAVENOUS
  Filled 2016-06-09: qty 250

## 2016-06-09 MED ORDER — ROCURONIUM BROMIDE 50 MG/5ML IV SOLN
100.0000 mg | Freq: Once | INTRAVENOUS | Status: AC
  Administered 2016-06-09: 100 mg via INTRAVENOUS

## 2016-06-09 MED ORDER — METOPROLOL SUCCINATE ER 50 MG PO TB24: 50.0000 mg | ORAL_TABLET | Freq: Every day | ORAL | Status: DC

## 2016-06-09 MED ORDER — AMLODIPINE BESYLATE 5 MG PO TABS
5.0000 mg | ORAL_TABLET | Freq: Every day | ORAL | Status: DC
Start: 2016-06-09 — End: 2016-06-10
  Administered 2016-06-09: 5 mg via ORAL
  Filled 2016-06-09: qty 1

## 2016-06-09 MED ORDER — LOSARTAN POTASSIUM 50 MG PO TABS: 25.0000 mg | ORAL_TABLET | Freq: Every day | ORAL | Status: DC

## 2016-06-09 MED ORDER — EPOETIN ALFA 10000 UNIT/ML IJ SOLN
4000.0000 [IU] | INTRAMUSCULAR | Status: DC
  Administered 2016-06-09: 4000 [IU] via INTRAVENOUS

## 2016-06-09 MED ORDER — AMLODIPINE BESYLATE 5 MG PO TABS: 5.0000 mg | ORAL_TABLET | Freq: Every day | ORAL | Status: DC

## 2016-06-09 MED ORDER — ETOMIDATE 2 MG/ML IV SOLN
20.0000 mg | Freq: Once | INTRAVENOUS | Status: AC
  Administered 2016-06-09: 20 mg via INTRAVENOUS

## 2016-06-09 MED ORDER — FUROSEMIDE 40 MG PO TABS: 80.0000 mg | ORAL_TABLET | Freq: Every day | ORAL | Status: DC

## 2016-06-09 MED ORDER — HEPARIN SODIUM (PORCINE) 5000 UNIT/ML IJ SOLN
5000.0000 [IU] | Freq: Three times a day (TID) | INTRAMUSCULAR | Status: DC
  Administered 2016-06-09 – 2016-06-10 (×4): 5000 [IU] via SUBCUTANEOUS
  Filled 2016-06-09 (×4): qty 1

## 2016-06-09 MED ORDER — FENTANYL CITRATE (PF) 100 MCG/2ML IJ SOLN: 100.0000 ug | INTRAMUSCULAR | Status: DC | PRN

## 2016-06-09 MED ORDER — NITROGLYCERIN 2 % TD OINT
1.0000 [in_us] | TOPICAL_OINTMENT | Freq: Once | TRANSDERMAL | Status: AC
  Administered 2016-06-09: 1 [in_us] via TOPICAL
  Filled 2016-06-09: qty 1

## 2016-06-09 MED ORDER — ALPRAZOLAM 0.25 MG PO TABS
0.2500 mg | ORAL_TABLET | Freq: Three times a day (TID) | ORAL | Status: DC | PRN
  Administered 2016-06-09: 0.25 mg via ORAL
  Filled 2016-06-09: qty 1

## 2016-06-09 MED ORDER — SODIUM CHLORIDE 0.9 % IV SOLN: 250.0000 mL | INTRAVENOUS | Status: DC | PRN

## 2016-06-09 MED ORDER — FAMOTIDINE IN NACL 20-0.9 MG/50ML-% IV SOLN: 20.0000 mg | Freq: Two times a day (BID) | INTRAVENOUS | Status: DC

## 2016-06-09 MED ORDER — PROPOFOL 1000 MG/100ML IV EMUL
0.0000 ug/kg/min | INTRAVENOUS | Status: DC
  Administered 2016-06-09: 5 ug/kg/min via INTRAVENOUS
  Filled 2016-06-09: qty 100

## 2016-06-09 MED ORDER — NITROGLYCERIN IN D5W 200-5 MCG/ML-% IV SOLN
0.0000 ug/min | INTRAVENOUS | Status: DC
Start: 2016-06-09 — End: 2016-06-10
  Administered 2016-06-09: 10 ug/min via INTRAVENOUS
  Administered 2016-06-09: 40 ug/min via INTRAVENOUS
  Administered 2016-06-09: 10 ug/min via INTRAVENOUS
  Filled 2016-06-09: qty 250

## 2016-06-09 MED ORDER — GABAPENTIN 300 MG PO CAPS
300.0000 mg | ORAL_CAPSULE | Freq: Every day | ORAL | Status: DC
  Administered 2016-06-10: 300 mg via ORAL
  Filled 2016-06-09: qty 1

## 2016-06-09 MED ORDER — HYDRALAZINE HCL 20 MG/ML IJ SOLN
INTRAMUSCULAR | Status: AC
  Administered 2016-06-09: 20 mg via INTRAVENOUS
  Filled 2016-06-09: qty 1

## 2016-06-09 MED ORDER — FUROSEMIDE 10 MG/ML IJ SOLN
80.0000 mg | Freq: Once | INTRAMUSCULAR | Status: AC
  Administered 2016-06-09: 80 mg via INTRAVENOUS
  Filled 2016-06-09: qty 8

## 2016-06-09 MED ORDER — CHLORHEXIDINE GLUCONATE 0.12 % MT SOLN
15.0000 mL | Freq: Two times a day (BID) | OROMUCOSAL | Status: DC
Start: 2016-06-09 — End: 2016-06-10
  Administered 2016-06-09 – 2016-06-10 (×3): 15 mL via OROMUCOSAL
  Filled 2016-06-09 (×2): qty 15

## 2016-06-09 MED ORDER — LOSARTAN POTASSIUM 25 MG PO TABS
100.0000 mg | ORAL_TABLET | Freq: Every day | ORAL | Status: DC
Start: 2016-06-09 — End: 2016-06-10
  Administered 2016-06-09: 100 mg via ORAL
  Filled 2016-06-09: qty 2
  Filled 2016-06-09: qty 1

## 2016-06-09 MED ORDER — ORAL CARE MOUTH RINSE
15.0000 mL | Freq: Two times a day (BID) | OROMUCOSAL | Status: DC
  Administered 2016-06-09: 15 mL via OROMUCOSAL

## 2016-06-09 MED ORDER — HYDRALAZINE HCL 20 MG/ML IJ SOLN
INTRAMUSCULAR | Status: AC
  Administered 2016-06-09: 20 mg
  Filled 2016-06-09: qty 1

## 2016-06-09 MED ORDER — LORAZEPAM 2 MG/ML IJ SOLN
INTRAMUSCULAR | Status: AC
Start: 2016-06-09 — End: 2016-06-09
  Administered 2016-06-09: 0.5 mg via INTRAVENOUS
  Filled 2016-06-09: qty 1

## 2016-06-09 MED ORDER — LABETALOL HCL 5 MG/ML IV SOLN
10.0000 mg | Freq: Once | INTRAVENOUS | Status: AC
  Administered 2016-06-09: 10 mg via INTRAVENOUS
  Filled 2016-06-09: qty 4

## 2016-06-09 MED ORDER — FAMOTIDINE IN NACL 20-0.9 MG/50ML-% IV SOLN
20.0000 mg | INTRAVENOUS | Status: DC
  Administered 2016-06-10: 20 mg via INTRAVENOUS
  Filled 2016-06-09: qty 50

## 2016-06-09 MED ORDER — NITROGLYCERIN IN D5W 200-5 MCG/ML-% IV SOLN
INTRAVENOUS | Status: AC
Start: 2016-06-09 — End: 2016-06-09
  Administered 2016-06-09: 40 ug/min via INTRAVENOUS
  Filled 2016-06-09: qty 250

## 2016-06-09 MED ORDER — CLONIDINE HCL 0.1 MG PO TABS
0.1000 mg | ORAL_TABLET | Freq: Every day | ORAL | Status: DC
  Administered 2016-06-09: 0.1 mg via ORAL
  Filled 2016-06-09: qty 1

## 2016-06-09 MED ORDER — INSULIN ASPART 100 UNIT/ML ~~LOC~~ SOLN
0.0000 [IU] | SUBCUTANEOUS | Status: DC
  Administered 2016-06-09: 3 [IU] via SUBCUTANEOUS
  Administered 2016-06-09 – 2016-06-10 (×2): 1 [IU] via SUBCUTANEOUS
  Filled 2016-06-09: qty 1
  Filled 2016-06-09: qty 3

## 2016-06-09 NOTE — Progress Notes (Signed)
Pharmacy progress note Antibiotic renal adjustment:  Patient is not currently on any antibiotics that require renal dosing. Pharmacy will follow and manage as appropriate.

## 2016-06-09 NOTE — Progress Notes (Signed)
Post dialysis 

## 2016-06-09 NOTE — Progress Notes (Signed)
[facility]link contacted re elevated BP and severe headache

## 2016-06-09 NOTE — Progress Notes (Addendum)
Pt experienced cramps at end of HD session.  Massaged calves and assisted to to reposition.  Pt still easily aroused and communicative.  Appears to be oriented  Vent at 30%.  2.5 liters off

## 2016-06-09 NOTE — ED Triage Notes (Addendum)
Pt presents to ED with sob with wet sounding cough for the past 2 days with "slight" chest pain. Pt states her cough is not productive but she feels like she is drowning when she starts to cough. Pt reports going to the "heart clinic" earlier today and gave her a fluid pill due to her sob with no relief. Increased work of breathing noted at this time.  dialysis Tuesday, Thursday, Saturday.

## 2016-06-09 NOTE — ED Provider Notes (Addendum)
[facility] Emergency Department Provider Note   ____________________________________________   First MD Initiated Contact with Patient 06/09/16 [phone]     (approximate)  I have reviewed the triage vital signs and the nursing notes.   HISTORY  Chief Complaint Shortness of Breath    HPI Jasmine Holloway is a 55 y.o. female who comes into the hospital today with shortness of breath. The patient has a history of end-stage renal disease and is on dialysis Tuesdays, Thursdays and Saturdays. She's been having some shortness of breath for the past 2 days. She reports that she did go to dialysis on Saturday but they were only able to take a liter off because started having some cramping in her legs. She went to the outpatient clinic today and was placed on Lasix but did not urinate much. The patient only urinates about once a day. She reports that she was trying to hold out until she could get dialysis again but the shortness of breath was getting worse and worse. She reports that she's had a little bit of chest pain that she rates a 6 out of 10 in intensity. She's also had some leg swelling and shaking. The patient denies any nausea or vomiting. She is here tonight for evaluation and treatment of her shortness of breath.   Past Medical History:  Diagnosis Date  . Asthma   . COPD (chronic obstructive pulmonary disease) ([place])   . Diabetes mellitus without complication ([place])   . Diastolic heart failure ([place])   . ESRD (end stage renal disease) on dialysis ([place])   . Hypertension   . Neuropathy ([place])   . Polycystic kidney disease   . Renal insufficiency     Patient Active Problem List   Diagnosis Date Noted  . Acute respiratory failure ([place]) 06/09/2016  . Atypical chest pain 06/08/2016  . Chronic pain syndrome 06/08/2016  . Anemia in stage 4 chronic kidney disease ([place]) 06/08/2016  . CHF exacerbation ([place]) 04/28/2016  . Epigastric pain 03/28/2016  . Acute CHF  (congestive heart failure) ([place]) 03/28/2016  . CHF (congestive heart failure) ([place]) 03/28/2016  . Chronic diastolic heart failure ([place]) 02/04/2016  . Hoarseness of voice 02/04/2016  . Acute on chronic diastolic CHF (congestive heart failure) ([place]) 12/22/2015  . ESRD on dialysis ([place]) 12/22/2015  . Essential hypertension 12/22/2015  . Hyperlipidemia 12/22/2015    Past Surgical History:  Procedure Laterality Date  . ARTERIOVENOUS GRAFT PLACEMENT Right    x3 (R forearm currently used for access)  . COLON SURGERY    . VEIN HARVEST      Prior to Admission medications   Medication Sig Start Date End Date Taking? Authorizing Provider  acetaminophen (TYLENOL) 325 MG tablet Take 2 tablets (650 mg total) by mouth every 6 (six) hours as needed for mild pain or headache. 03/18/16  Yes Nicholes Mango, MD  albuterol (PROVENTIL HFA;VENTOLIN HFA) 108 (90 Base) MCG/ACT inhaler Inhale 2 puffs into the lungs every 6 (six) hours as needed for wheezing or shortness of breath. 03/05/16 06/09/16 Yes Alisa Graff, FNP  amLODipine (NORVASC) 5 MG tablet Take 5 mg by mouth daily.    Yes Historical Provider, MD  aspirin 81 MG chewable tablet Chew 1 tablet (81 mg total) by mouth daily. 01/17/16  Yes Gladstone Lighter, MD  b complex-vitamin c-folic acid (NEPHRO-VITE) 0.8 MG TABS tablet Take 1 tablet by mouth at bedtime.   Yes Historical Provider, MD  calcium acetate (PHOSLO) 667 MG capsule Take 667 mg  by mouth 3 (three) times daily with meals.    Yes Historical Provider, MD  cloNIDine (CATAPRES) 0.1 MG tablet Take 1 tablet (0.1 mg total) by mouth 2 (two) times daily. 01/17/16  Yes Gladstone Lighter, MD  furosemide (LASIX) 80 MG tablet Take 1 tablet (80 mg total) by mouth daily. 03/05/16 06/09/16 Yes Alisa Graff, FNP  gabapentin (NEURONTIN) 300 MG capsule Take 300 mg by mouth daily.    Yes Historical Provider, MD  irbesartan (AVAPRO) 150 MG tablet Take 1 tablet by mouth daily. 05/20/16  Yes Historical Provider, MD   losartan (COZAAR) 25 MG tablet Take 1 tablet (25 mg total) by mouth daily. 05/13/16  Yes Vaughan Basta, MD  metoprolol succinate (TOPROL-XL) 50 MG 24 hr tablet Take 1 tablet by mouth daily. 06/05/16  Yes Historical Provider, MD  omeprazole (PRILOSEC) 20 MG capsule Take 20 mg by mouth 2 (two) times daily.    Yes Historical Provider, MD  rosuvastatin (CRESTOR) 5 MG tablet Take 5 mg by mouth at bedtime.    Yes Historical Provider, MD  metolazone (ZAROXOLYN) 5 MG tablet Take 1 tablet (5 mg total) by mouth daily as needed. 06/08/16 09/06/16  Minna Merritts, MD  metoprolol (LOPRESSOR) 50 MG tablet Take 1 tablet (50 mg total) by mouth 2 (two) times daily. 05/12/16   Vaughan Basta, MD  oxyCODONE-acetaminophen (PERCOCET) 10-325 MG tablet Take 0.5-1 tablets by mouth daily as needed for pain. 03/18/16   Historical Provider, MD  SENSIPAR 30 MG tablet Take 1 tablet by mouth daily. 06/05/16   Historical Provider, MD    Allergies Morphine and related; Vicodin [hydrocodone-acetaminophen]; Buprenorphine hcl; and Codeine  Family History  Problem Relation Age of Onset  . Hypertension Brother   . Heart failure Brother   . Hypertension Mother   . Heart disease Mother     Social History Social History  Substance Use Topics  . Smoking status: Former Smoker    Packs/day: 0.25    Quit date: 08/11/2015  . Smokeless tobacco: Never Used  . Alcohol use No    Review of Systems Constitutional: No fever/chills Eyes: No visual changes. ENT: No sore throat. Cardiovascular:  chest pain. Respiratory: shortness of breath. Gastrointestinal: No abdominal pain.  No nausea, no vomiting.  No diarrhea.  No constipation. Genitourinary: Negative for dysuria. Musculoskeletal: Negative for back pain. Skin: Negative for rash. Neurological: Negative for headaches, focal weakness or numbness. Hematological/Lymphatic:Leg swelling  10-point ROS otherwise  negative.  ____________________________________________   PHYSICAL EXAM:  VITAL SIGNS: ED Triage Vitals  Enc Vitals Group     BP 06/09/16 0216 (!) 187/106     Pulse Rate 06/09/16 0216 87     Resp 06/09/16 0216 (!) 24     Temp --      Temp Source 06/09/16 0216 Oral     SpO2 06/09/16 0216 (!) 85 %     Weight 06/09/16 0217 185 lb (83.9 kg)     Height 06/09/16 0217 5\' 3"  (1.6 m)     Head Circumference --      Peak Flow --      Pain Score 06/09/16 0217 6     Pain Loc --      Pain Edu? --      Excl. in [place]? --     Constitutional: Alert and oriented. Ill appearing and in moderate respiratory distress. Eyes: Conjunctivae are normal. PERRL. EOMI. Head: Atraumatic. Nose: No congestion/rhinnorhea. Mouth/Throat: Mucous membranes are moist.  Oropharynx non-erythematous. Cardiovascular: Normal rate, regular  rhythm. Grossly normal heart sounds.  Good peripheral circulation. Respiratory: Increased respiratory effort.  No retractions. Crackles in bilateral bases Gastrointestinal: Soft and nontender. No distention. Positive bowel sounds Musculoskeletal: Bilateral lower extremity pitting edema Neurologic:  Normal speech and language.  Skin:  Skin is warm, dry and intact. No rash noted. Psychiatric: Mood and affect are normal.   ____________________________________________   LABS (all labs ordered are listed, but only abnormal results are displayed)  Labs Reviewed  TROPONIN I - Abnormal; Notable for the following:       Result Value   Troponin I 0.04 (*)    All other components within normal limits  BRAIN NATRIURETIC PEPTIDE - Abnormal; Notable for the following:    B Natriuretic Peptide 1,759.0 (*)    All other components within normal limits  CBC WITH DIFFERENTIAL/PLATELET - Abnormal; Notable for the following:    RBC 3.62 (*)    Hemoglobin 10.6 (*)    HCT 31.7 (*)    RDW 23.7 (*)    Basophils Absolute 0.2 (*)    All other components within normal limits  BASIC METABOLIC PANEL  - Abnormal; Notable for the following:    Glucose, Bld 108 (*)    BUN 76 (*)    Creatinine, Ser 11.95 (*)    Calcium 8.2 (*)    GFR calc non Af Amer 3 (*)    GFR calc Af Amer 4 (*)    All other components within normal limits  CBC  TROPONIN I  TROPONIN I  TROPONIN I  BASIC METABOLIC PANEL  MAGNESIUM  PHOSPHORUS   ____________________________________________  EKG  ED ECG REPORT I, Loney Hering, the attending physician, personally viewed and interpreted this ECG.   Date: 06/09/2016  EKG Time: 215  Rate: 88  Rhythm: normal sinus rhythm  Axis: normal  Intervals:none  ST&T Change: flipped t waves in V5, V6 q waves in lead V1, V2  ____________________________________________  RADIOLOGY  CXR ____________________________________________   PROCEDURES  Procedure(s) performed: please, see procedure note(s).  Procedure Name: Intubation Date/Time: 06/09/2016 5:23 AM Performed by: Loney Hering Pre-anesthesia Checklist: Emergency Drugs available, Patient identified and Patient being monitored Oxygen Delivery Method: Ambu bag Preoxygenation: Pre-oxygenation with 100% oxygen Intubation Type: Rapid sequence Ventilation: Mask ventilation without difficulty Laryngoscope Size: Glidescope and 3 Tube size: 7.5 mm Number of attempts: 1 Placement Confirmation: ETT inserted through vocal cords under direct vision,  Positive ETCO2 and Breath sounds checked- equal and bilateral Secured at: 20 cm Tube secured with: ETT holder       Critical Care performed: Yes, see critical care note(s)   CRITICAL CARE Performed by: Charlesetta Ivory P   Total critical care time: 60 minutes  Critical care time was exclusive of separately billable procedures and treating other patients.  Critical care was necessary to treat or prevent imminent or life-threatening deterioration.  Critical care was time spent personally by me on the following activities: development of  treatment plan with patient and/or surrogate as well as nursing, discussions with consultants, evaluation of patient's response to treatment, examination of patient, obtaining history from patient or surrogate, ordering and performing treatments and interventions, ordering and review of laboratory studies, ordering and review of radiographic studies, pulse oximetry and re-evaluation of patient's condition.   ____________________________________________   INITIAL IMPRESSION / ASSESSMENT AND PLAN / ED COURSE  Pertinent labs & imaging results that were available during my care of the patient were reviewed by me and considered in my medical decision  making (see chart for details).  This is a 55 year old female with a history of an seizure renal disease on dialysis who comes in today with shortness of breath. The patient reports that she has fluid on her lungs. The patient reports that they were not able to take off enough fluid when she was at dialysis on Saturday. The patient has attempted to take off fluid with Lasix but she does not make much urine. The patient is hypoxic when she came into the hospital so she was placed on BiPAP when she initially arrived. On the BiPAP the patient's sats did improve significantly. The patient does not have any emergent indication for dialysis at this time as she is doing well on BiPAP. I will admit the patient to the hospitalist service for management of her pulmonary edema. The patient's chest x-ray did also show some pulmonary edema.  Clinical Course  Value Comment By Time  DG Chest Portable 1 View Interstitial pulmonary edema and ill-defined basilar opacities which probably represent mild alveolar edema. Underlying pneumonia is not excluded. Probable small bilateral effusions. Stable cardiomegaly.   Loney Hering, MD 10/31 [phone]   The patient's work of breathing worsened after a short amount of time. The decision was made to upgrade the patient's care to  intensive care unit. I did contact the nephrologist who recommended giving the patient some labetalol as well as Lasix 80 mg and nitroglycerin. She reported that it felt as though the air was too much and she could not tolerate it. We'll give the patient half milligram of Ativan to see if that also helps with her breathing. If not then we will consider intubation for the patient.  The patient's dyspnea continued to worsen. She was becoming tachycardic and very agitated on BiPAP. The decision was made to intubate the patient. She was intubated using etomidate and rocuronium. The patient was bagged easily and did not decrease her oxygen saturation. The patient was successfully intubated without any difficulties. I did then place the patient on a Versed and fentanyl drip. The patient will be admitted to the intensive care unit service. ____________________________________________   FINAL CLINICAL IMPRESSION(S) / ED DIAGNOSES  Final diagnoses:  Acute pulmonary edema (HCC)  Hypoxia  Shortness of breath      NEW MEDICATIONS STARTED DURING THIS VISIT:  New Prescriptions   No medications on file     Note:  This document was prepared using Dragon voice recognition software and may include unintentional dictation errors.    Loney Hering, MD 06/09/16 1115    Loney Hering, MD 06/09/16 [phone]

## 2016-06-09 NOTE — ED Notes (Signed)
At 0513, Pt agitated and pulling off facemask.  Dr. Dahlia Client at bedside, pt agreeing to intubation at this time.  Wyonia Hough, RN pushed rocuronium and etomidate at [phone] and intubation completed by Dr. Dahlia Client at [phone].  Bilateral breath sounds heard by Dr Dahlia Client post intubation.

## 2016-06-09 NOTE — Progress Notes (Signed)
Subjective:   Patient known to our practice from outpatient dialysis Her last treatment was reportedly shortened due to cramps.  She goes to dialysis TTS/[address]  Presented to ER last evening for worsening SOB. noted to have significantly elevated BP Did not tolerate BiPAP therefore intubated   Objective:  Vital signs in last 24 hours:  Temp:  [97.5 F (36.4 C)] 97.5 F (36.4 C) (10/31 0800) Pulse Rate:  [75-87] 79 (10/31 0455) Resp:  [22-35] 35 (10/31 0455) BP: (169-240)/(101-135) 196/116 (10/31 0455) SpO2:  [85 %-100 %] 94 % (10/31 0639) FiO2 (%):  [32 %-100 %] 50 % (10/31 0800) Weight:  [82.9 kg (182 lb 12.2 oz)-83.9 kg (185 lb)] 82.9 kg (182 lb 12.2 oz) (10/31 0620)  Weight change:  Filed Weights   06/09/16 0217 06/09/16 0620  Weight: 83.9 kg (185 lb) 82.9 kg (182 lb 12.2 oz)    Intake/Output:    Intake/Output Summary (Last 24 hours) at 06/09/16 0845 Last data filed at 06/09/16 0800  Gross per 24 hour  Intake            59.38 ml  Output              150 ml  Net           -90.62 ml     Physical Exam: General: Critically ill appearing  HEENT Anicteic/ ETT in place  Neck supple  Pulm/lungs Coarse crackles b/l, Vent assisted  CVS/Heart regular  Abdomen:  Soft, NT  Extremities: No edema  Neurologic: sedated  Skin: No acute rashes  Access: Rt arm AVF       Basic Metabolic Panel:   Recent Labs Lab 06/09/16 0236 06/09/16 0624  NA 143  --   K 4.8  --   CL 102  --   CO2 29  --   GLUCOSE 108*  --   BUN 76*  --   CREATININE 11.95*  --   CALCIUM 8.2*  --   MG  --  2.2  PHOS  --  7.2*     CBC:  Recent Labs Lab 06/09/16 0236  WBC 8.0  NEUTROABS 5.5  HGB 10.6*  HCT 31.7*  MCV 87.7  PLT 314      Microbiology:  Recent Results (from the past 720 hour(s))  MRSA PCR Screening     Status: None   Collection Time: 05/11/16 10:26 AM  Result Value Ref Range Status   MRSA by PCR NEGATIVE NEGATIVE Final    Comment:        The  GeneXpert MRSA Assay (FDA approved for NASAL specimens only), is one component of a comprehensive MRSA colonization surveillance program. It is not intended to diagnose MRSA infection nor to guide or monitor treatment for MRSA infections.     Coagulation Studies: No results for input(s): LABPROT, INR in the last 72 hours.  Urinalysis: No results for input(s): COLORURINE, LABSPEC, PHURINE, GLUCOSEU, HGBUR, BILIRUBINUR, KETONESUR, PROTEINUR, UROBILINOGEN, NITRITE, LEUKOCYTESUR in the last 72 hours.  Invalid input(s): APPERANCEUR    Imaging: Dg Chest Portable 1 View  Result Date: 06/09/2016 CLINICAL DATA:  Respiratory failure.  Intubation. EXAM: PORTABLE CHEST 1 VIEW COMPARISON:  06/09/2016 at 02:23 FINDINGS: Endotracheal tube is satisfactorily positioned with tip 3.7 cm above the carina. Nasogastric tube extends into the stomach and beyond the inferior edge of the image. Extensive airspace opacities are present bilaterally without significant interval change. Probable small pleural effusions bilaterally. IMPRESSION: 1.  Support equipment appears satisfactorily positioned. 2.  No significant interval change in the bilateral airspace opacities. Electronically Signed   By: Andreas Newport M.D.   On: 06/09/2016 06:05   Dg Chest Portable 1 View  Result Date: 06/09/2016 CLINICAL DATA:  55 y/o  F; cough and slight chest pain. EXAM: PORTABLE CHEST 1 VIEW COMPARISON:  05/11/2016 chest radiograph FINDINGS: Stable cardiomegaly given projection and technique. Interstitial edema and ill-defined opacities in the lung bases may represent mild alveolar edema. Underlying pneumonia is not excluded. Blunted costal diaphragmatic sulci probably represent small effusions. No pneumothorax. No acute osseous abnormality is evident. Postsurgical changes in the left axilla. IMPRESSION: Interstitial pulmonary edema and ill-defined basilar opacities which probably represent mild alveolar edema. Underlying pneumonia  is not excluded. Probable small bilateral effusions. Stable cardiomegaly. Electronically Signed   By: Kristine Garbe M.D.   On: 06/09/2016 02:45     Medications:   . fentaNYL infusion INTRAVENOUS 100 mcg/hr (06/09/16 0806)  . nitroGLYCERIN 100 mcg/min (06/09/16 0800)  . propofol (DIPRIVAN) infusion 17.5 mcg/kg/min (06/09/16 0836)   . amLODipine  5 mg Oral Daily  . aspirin  324 mg Oral NOW   Or  . aspirin  300 mg Rectal NOW  . cloNIDine  0.1 mg Oral Daily  . famotidine (PEPCID) IV  20 mg Intravenous Q24H  . gabapentin  300 mg Oral Daily  . heparin  5,000 Units Subcutaneous Q8H  . insulin aspart  0-9 Units Subcutaneous Q4H  . ipratropium-albuterol  3 mL Nebulization Q6H  . losartan  25 mg Oral Daily  . metoprolol succinate  50 mg Oral Daily   sodium chloride, acetaminophen, fentaNYL (SUBLIMAZE) injection, fentaNYL (SUBLIMAZE) injection, ondansetron (ZOFRAN) IV  Assessment/ Plan:  55 y.o.African [place] female with ESRD secondary to polycystic kidney hypertension, GERD, peritoneal dialysis h/o peritonitis, arthritis, chronic left hand pain,h/o  ruptured access .  Admitted for Hypertensive emergency and SOB  TTS, [place]   1. End Stage Renal Disease: requiring emergent hemodialysis   - UF goal 2.5 to 3 kg - outpatient EDW 78.5 kg  2. Anemia of chronic kidney disease: hemoglobin 10.6 - epo with dialysis treatments.   3. Acute pulmonary edema:  echo from 01/2016 shows Moderate conc LVH  4. Malignant HTN with Pulmonary edema and Acute resp failure - currently requiring iv NTG drip - amlodipine, clonidine, losartan  and metoprolol - dose amlodipine and ARB at night  5. Secondary Hyperparathyroidism: with hyperphosphatemia.  - calcium acetate with meals when able to take PO    LOS: 0 Iriel Nason 10/31/20178:45 AM

## 2016-06-09 NOTE — Progress Notes (Signed)
Chaplain rounded the unit to provide a compassionate presence and support to the patient.  The patient appeared to be sleeping and a silent prayer was said. Minerva Fester [phone]

## 2016-06-09 NOTE — Progress Notes (Signed)
Dialysis complete

## 2016-06-09 NOTE — Progress Notes (Signed)
Dialysis started 

## 2016-06-09 NOTE — Progress Notes (Signed)
Per Dr. Merian Capron order the pt. Was extubated. She was suctioned prior to extubation for a small amount of secretions. She was placed on 2 L nasal cannula. Sa02 was 97%.

## 2016-06-09 NOTE — Progress Notes (Signed)
[facility] Progress Note Patient Name: Jasmine Holloway DOB: 1960-09-28 MRN: [id number]   Date of Service  06/09/2016  HPI/Events of Note  ER physician calls me to have patient evaluated by ICU service.  Patient has chronic kidney disease, gets dialyzed Tuesday Thursday Saturday. They were not able to get a lot of fluid off on Saturday. She is now at the ER because of respiratory distress, hypertensive urgency. Blood pressure 630 systolic. Tachypneic. Tachycardic. They have placed her on BiPAP and she is a little better. ER has contacted renal service. Plan is to dialyze her soon.   Suggested nitro drip if blood pressures not better. She makes little urine and was given Lasix at the ER.   I have discussed the case with Marda Stalker.   Anticipate pt will be admitted to ICU.   eICU Interventions          [place] 06/09/2016, 4:38 AM

## 2016-06-09 NOTE — ED Notes (Signed)
Attempted to call report at this time.  Charge RN still observing chart.

## 2016-06-09 NOTE — Progress Notes (Signed)
Pre Dialysis 

## 2016-06-09 NOTE — Progress Notes (Addendum)
Pt experienced 1 episode emesis, very sudden, pt reported she felt nauseous beforehand, emesis was all secretions and mucous, no solids observed.  Dr Stevenson Clinch aware.  Cleaned patient up and proceeded with extubation by RTs.  Pt extuated at 1546.  [place] at 2 liters.  Still vomiting clear liquid and reporting a headache.  A&O x 4 and repositioning herself in bed

## 2016-06-09 NOTE — Progress Notes (Signed)
Initial Nutrition Assessment  DOCUMENTATION CODES:   Obesity unspecified  INTERVENTION:  If unable to extubate within 24-48 hours, recommend initiation of enteral nutrition. Continue to assess  NUTRITION DIAGNOSIS:   Inadequate oral intake related to acute illness as evidenced by NPO status.  GOAL:   Provide needs based on ASPEN/SCCM guidelines  MONITOR:   Vent status, Labs, Weight trends  REASON FOR ASSESSMENT:   Ventilator    ASSESSMENT:    55 yo female admitted with acute on chronic respiratory failure secondary to pulmonary edema requiring intubation 10/31. Pt with hx of ESRD on HD, CHF, DM, HTN  Pt remains sedated on vent, currently receiving dialysis. Possible extubation post fluid removal via HD  Diet Order:  Diet NPO time specified  Skin:  Reviewed, no issues  Last BM:  no documented BM   Labs: phosphorus 7.2 Glucose Profile:   Recent Labs  06/09/16 0610 06/09/16 0750 06/09/16 1103  GLUCAP 250* 136* 80   Meds:  Lasix, ss novolog  Height:   Ht Readings from Last 1 Encounters:  06/09/16 5\' 4"  (1.626 m)    Weight:   Wt Readings from Last 1 Encounters:  06/09/16 180 lb 5.4 oz (81.8 kg)    Filed Weights   06/09/16 0915 06/09/16 1250 06/09/16 1300  Weight: 185 lb 13.6 oz (84.3 kg) 184 lb 11.9 oz (83.8 kg) 180 lb 5.4 oz (81.8 kg)    BMI:  Body mass index is 30.95 kg/m.  Estimated Nutritional Needs:   Kcal:  [phone] kcals  Protein:  >/= 148 g  Fluid:  1000 mL plus UOP  EDUCATION NEEDS:   No education needs identified at this time  [place], [place], [place] [phone] Pager  [phone] Weekend/On-Call Pager

## 2016-06-09 NOTE — Progress Notes (Signed)
E Link notified of anxiety, agitation, tearfulness.  Also requested a diet for the patient

## 2016-06-09 NOTE — H&P (Signed)
PULMONARY / CRITICAL CARE MEDICINE   Name: Jasmine Holloway MRN: [id number] DOB: 03-Aug-1961    ADMISSION DATE:  06/09/2016 CONSULTATION DATE:  06/09/2016  REFERRING MD:  Dr. Dahlia Client  CHIEF COMPLAINT:  Shortness of Breath  HISTORY OF PRESENT ILLNESS:   This is a 55 yo female with a PMH of ESRD (Hemodialysis T, Th, and Sat), Ruptured AV access left arm, Chronic left arm pain followed by pain clinic, Chronic anemia, Polycystic kidney disease, Hypertension, Neuropathy, Diastolic heart failure, Diabetes mellitus, COPD, and Asthma.  She presented to [facility] ER on 10/31 with c/o shortness of breath onset 10/29.  Per ER notes she reported she went to dialysis on 10/28, however during dialysis they were only able to pull off 1 liter of fluid because she started developing cramps in her bilateral legs.  According to her boyfriend the pt started developing worsening shortness of breath on 10/30 and she has a chronic nonproductive cough he instructed her to go to the ER, however she saw Cardiologist Dr. Rockey Situ instead to establish care.  Once pt arrived back home her shortness of breath worsened she attempted to wait to receive her scheduled outpatient dialysis on Tuesday 10/31, however her symptoms worsened, prompting her visit to the ER on 10/30.  Per ER notes the patient makes very limited urine, she voids once a day and is currently on 80 mg daily.  In the ER she reported 6/10 chest pain with bilateral leg swelling and shaking.  She was placed on Bipap due to respiratory distress, however she became increasingly agitated with increased work of breath, therefore requiring mechanical intubation.  PCCM contacted to admit pt to ICU due to acute on chronic hypoxic hypercapnic respiratory failure secondary to pulmonary edema requiring mechanical intubation and management of hypertensive urgency.  PAST MEDICAL HISTORY :  She  has a past medical history of Asthma; COPD (chronic obstructive pulmonary disease) ([place]); Diabetes  mellitus without complication ([place]); Diastolic heart failure ([place]); ESRD (end stage renal disease) on dialysis ([place]); Hypertension; Neuropathy ([place]); Polycystic kidney disease; and Renal insufficiency.  PAST SURGICAL HISTORY: She  has a past surgical history that includes Arteriovenous graft placement (Right); Vein harvest; and Colon surgery.  Allergies  Allergen Reactions  . Morphine And Related Nausea And Vomiting  . Vicodin [Hydrocodone-Acetaminophen] Nausea And Vomiting  . Buprenorphine Hcl Nausea And Vomiting  . Codeine Nausea And Vomiting    No current facility-administered medications on file prior to encounter.    Current Outpatient Prescriptions on File Prior to Encounter  Medication Sig  . acetaminophen (TYLENOL) 325 MG tablet Take 2 tablets (650 mg total) by mouth every 6 (six) hours as needed for mild pain or headache.  . albuterol (PROVENTIL HFA;VENTOLIN HFA) 108 (90 Base) MCG/ACT inhaler Inhale 2 puffs into the lungs every 6 (six) hours as needed for wheezing or shortness of breath.  [place] Kitchen amLODipine (NORVASC) 5 MG tablet Take 5 mg by mouth daily.   Mar[place] Kitchen aspirin 81 MG chewable tablet Chew 1 tablet (81 mg total) by mouth daily.  Mar[place] Kitchen b complex-vitamin c-folic acid (NEPHRO-VITE) 0.8 MG TABS tablet Take 1 tablet by mouth at bedtime.  . calcium acetate (PHOSLO) 667 MG capsule Take 667 mg by mouth 3 (three) times daily with meals.   . cloNIDine (CATAPRES) 0.1 MG tablet Take 1 tablet (0.1 mg total) by mouth 2 (two) times daily.  . furosemide (LASIX) 80 MG tablet Take 1 tablet (80 mg total) by mouth daily.  Marland Kitchen gabapentin (NEURONTIN) 300 MG capsule Take  300 mg by mouth daily.   Marland Kitchen losartan (COZAAR) 25 MG tablet Take 1 tablet (25 mg total) by mouth daily.  Marland Kitchen omeprazole (PRILOSEC) 20 MG capsule Take 20 mg by mouth 2 (two) times daily.   . rosuvastatin (CRESTOR) 5 MG tablet Take 5 mg by mouth at bedtime.   . metolazone (ZAROXOLYN) 5 MG tablet Take 1 tablet (5 mg total) by mouth daily as needed.   . metoprolol (LOPRESSOR) 50 MG tablet Take 1 tablet (50 mg total) by mouth 2 (two) times daily.  Marland Kitchen oxyCODONE-acetaminophen (PERCOCET) 10-325 MG tablet Take 0.5-1 tablets by mouth daily as needed for pain.    FAMILY HISTORY:  Her indicated that her mother is deceased. She indicated that her father is deceased. She indicated that her brother is alive.    SOCIAL HISTORY: She  reports that she quit smoking about 9 months ago. She smoked 0.25 packs per day. She has never used smokeless tobacco. She reports that she does not drink alcohol or use drugs.  REVIEW OF SYSTEMS:   Unable to assess pt intubated  SUBJECTIVE:  Pt intubated   VITAL SIGNS: BP (!) 196/116   Pulse 79   Resp (!) 35   Ht 5\' 3"  (1.6 m)   Wt 185 lb (83.9 kg)   SpO2 95%   BMI 32.77 kg/m   HEMODYNAMICS:    VENTILATOR SETTINGS: FiO2 (%):  [32 %] 32 %  INTAKE / OUTPUT: No intake/output data recorded.  PHYSICAL EXAMINATION: General:  Acutely ill AA female Neuro:  Sedated, does not follow commands HEENT:  Supple, no JVD Cardiovascular:  s1s2, rrr, no M/R/G, 1+ bilateral lower extremity edema Lungs:  Diminished throughout, even, non labored Abdomen:  +BS x4, obese, soft, non tender Musculoskeletal:  Normal bulk and tone Skin:  Intact no rashes or lesions  LABS:  BMET  Recent Labs Lab 06/09/16 0236  NA 143  K 4.8  CL 102  CO2 29  BUN 76*  CREATININE 11.95*  GLUCOSE 108*    Electrolytes  Recent Labs Lab 06/09/16 0236  CALCIUM 8.2*    CBC  Recent Labs Lab 06/09/16 0236  WBC 8.0  HGB 10.6*  HCT 31.7*  PLT 314    Coag's No results for input(s): APTT, INR in the last 168 hours.  Sepsis Markers No results for input(s): LATICACIDVEN, PROCALCITON, O2SATVEN in the last 168 hours.  ABG No results for input(s): PHART, PCO2ART, PO2ART in the last 168 hours.  Liver Enzymes No results for input(s): AST, ALT, ALKPHOS, BILITOT, ALBUMIN in the last 168 hours.  Cardiac Enzymes  Recent  Labs Lab 06/09/16 0236  TROPONINI 0.04*    Glucose No results for input(s): GLUCAP in the last 168 hours.  Imaging Dg Chest Portable 1 View  Result Date: 06/09/2016 CLINICAL DATA:  55 y/o  F; cough and slight chest pain. EXAM: PORTABLE CHEST 1 VIEW COMPARISON:  05/11/2016 chest radiograph FINDINGS: Stable cardiomegaly given projection and technique. Interstitial edema and ill-defined opacities in the lung bases may represent mild alveolar edema. Underlying pneumonia is not excluded. Blunted costal diaphragmatic sulci probably represent small effusions. No pneumothorax. No acute osseous abnormality is evident. Postsurgical changes in the left axilla. IMPRESSION: Interstitial pulmonary edema and ill-defined basilar opacities which probably represent mild alveolar edema. Underlying pneumonia is not excluded. Probable small bilateral effusions. Stable cardiomegaly. Electronically Signed   By: Kristine Garbe M.D.   On: 06/09/2016 02:45   STUDIES:  None  CULTURES: None  ANTIBIOTICS: None  SIGNIFICANT  EVENTS: 10/31-Pt admitted by PCCM to [facility] ICU due to acute on chronic hypoxic respiratory failure secondary to pulmonary edema requiring mechanical intubation and management of hypertensive urgency  LINES/TUBES: PIV's x2 10/31>>  ETT 10/31>>  ASSESSMENT / PLAN:  PULMONARY A: Acute on chronic hypoxic respiratory failure secondary to pulmonary edema Mechanical ventilation Hx: Asthma and COPD P:   Full vent support Vap Bundle Maintain O2 sats 88% to 92% Prn bronchodilators ABG today 1hr post intubation 10/31 CXR in am Hemodialysis today 10/31  CARDIOVASCULAR A:  Hypertensive Urgency Chest Pain Hx: Diastolic heart failure P:  Trend troponin's Nitroglycerin gtt for chest pain resolution or to maintain SBP <150 Continue outpatient norvasc, aspirin, clonidine, and cozaar Telemetry monitoring  RENAL A:   ESRD-Hemodialysis (T, Th, Sat) Oliguria Hx: Polycystic  kidney disease P:   Nephrology consulted appreciate input Hemodialysis today 10/31 Monitor uop Trend BMP's Replace electrolytes as indicated  GASTROINTESTINAL A:   No acute issues P:   Keep NPO for now Pepcid for PUD prophylaxis  HEMATOLOGIC A:   Anemia P:  Heparin for VTE prophylaxis Trend CBC's Monitor s/sx of bleeding Transfuse for hgb <7  INFECTIOUS A:   No acute issues P:   Trend WBC's and monitor fever curve Trend PCT's  Will obtain cultures if pt becomes febrile or PCT's elevated  ENDOCRINE A:   Diabetes Mellitus   P:   CBG's q4hrs SSI  NEUROLOGIC A:   Chronic pain P:   RASS goal: 0 to -1 Propofol and Fentanyl gtt to maintain RASS goal Prn fentanyl for pain management Lights on during the day    FAMILY  - Updates: Pts boyfriend updated about plan of care and questions answered 06/09/2016  - Inter-disciplinary family meet or Palliative Care meeting due by:  06/16/2016  Marda Stalker, [place] Pager [phone] (please enter 7 digits) PCCM Consult Pager [phone] (please enter 7 digits)

## 2016-06-10 ENCOUNTER — Inpatient Hospital Stay: Payer: Medicare Other

## 2016-06-10 LAB — BASIC METABOLIC PANEL
ANION GAP: 12 (ref 5–15)
BUN: 41 mg/dL — ABNORMAL HIGH (ref 6–20)
CALCIUM: 8.3 mg/dL — AB (ref 8.9–10.3)
CO2: 32 mmol/L (ref 22–32)
Chloride: 96 mmol/L — ABNORMAL LOW (ref 101–111)
Creatinine, Ser: 8.7 mg/dL — ABNORMAL HIGH (ref 0.44–1.00)
GFR, EST AFRICAN AMERICAN: 5 mL/min — AB (ref >60)
GFR, EST NON AFRICAN AMERICAN: 4 mL/min — AB (ref >60)
GLUCOSE: 110 mg/dL — AB (ref 65–99)
POTASSIUM: 4.3 mmol/L (ref 3.5–5.1)
Sodium: 140 mmol/L (ref 135–145)

## 2016-06-10 LAB — CBC WITH DIFFERENTIAL/PLATELET
BASOS ABS: 0.1 10*3/uL (ref 0–0.1)
BASOS PCT: 1 %
Eosinophils Absolute: 0.5 10*3/uL (ref 0–0.7)
Eosinophils Relative: 6 %
HEMATOCRIT: 31.8 % — AB (ref 35.0–47.0)
Hemoglobin: 10.1 g/dL — ABNORMAL LOW (ref 12.0–16.0)
LYMPHS PCT: 14 %
Lymphs Abs: 1.1 10*3/uL (ref 1.0–3.6)
MCH: 28.1 pg (ref 26.0–34.0)
MCHC: 31.7 g/dL — ABNORMAL LOW (ref 32.0–36.0)
MCV: 88.5 fL (ref 80.0–100.0)
MONO ABS: 0.8 10*3/uL (ref 0.2–0.9)
Monocytes Relative: 10 %
NEUTROS ABS: 5.3 10*3/uL (ref 1.4–6.5)
Neutrophils Relative %: 69 %
Platelets: 271 10*3/uL (ref 150–440)
RBC: 3.59 MIL/uL — AB (ref 3.80–5.20)
RDW: 23.1 % — AB (ref 11.5–14.5)
WBC: 7.7 10*3/uL (ref 3.6–11.0)

## 2016-06-10 LAB — GLUCOSE, CAPILLARY
GLUCOSE-CAPILLARY: 119 mg/dL — AB (ref 65–99)
GLUCOSE-CAPILLARY: 139 mg/dL — AB (ref 65–99)
GLUCOSE-CAPILLARY: 86 mg/dL (ref 65–99)
Glucose-Capillary: 124 mg/dL — ABNORMAL HIGH (ref 65–99)

## 2016-06-10 LAB — HEPATITIS B SURFACE ANTIGEN: HEP B S AG: NEGATIVE

## 2016-06-10 LAB — PROCALCITONIN: PROCALCITONIN: 32.54 ng/mL

## 2016-06-10 LAB — HEPATITIS B SURFACE ANTIBODY, QUANTITATIVE: Hepatitis B-Post: >1000 m[IU]/mL (ref >9.9)

## 2016-06-10 LAB — TROPONIN I: TROPONIN I: 0.09 ng/mL — AB (ref <0.03)

## 2016-06-10 NOTE — Progress Notes (Signed)
Patient ambulated around ICU and o2 sats 98% on 2L o2.

## 2016-06-10 NOTE — Progress Notes (Signed)
Subjective:   Patient feels better today.  She is extubated and is getting supplemental oxygen by nasal cannula 800 cc of fluid was removed with dialysis Blood pressure today is well-controlled    Objective:  Vital signs in last 24 hours:  Temp:  [98.1 F (36.7 C)-98.8 F (37.1 C)] 98.1 F (36.7 C) (11/01 0800) Pulse Rate:  [69-118] 77 (11/01 1100) Resp:  [15-36] 20 (11/01 1100) BP: (115-181)/(64-117) 134/78 (11/01 1100) SpO2:  [94 %-100 %] 98 % (11/01 1100) FiO2 (%):  [30 %] 30 % (10/31 1510)  Weight change: 0.384 kg (13.6 oz) Filed Weights   06/09/16 0915 06/09/16 1250 06/09/16 1300  Weight: 84.3 kg (185 lb 13.6 oz) 83.8 kg (184 lb 11.9 oz) 81.8 kg (180 lb 5.4 oz)    Intake/Output:    Intake/Output Summary (Last 24 hours) at 06/10/16 1328 Last data filed at 06/10/16 1000  Gross per 24 hour  Intake            341.1 ml  Output              250 ml  Net             91.1 ml     Physical Exam: General: chronically ill appearing  HEENT Anicteic/ nasal cannula  Neck supple  Pulm/lungs Coarse crackles left base  CVS/Heart regular  Abdomen:  Soft, NT  Extremities: No edema  Neurologic: Alert and oriented  Skin: No acute rashes  Access: Rt arm AVF       Basic Metabolic Panel:   Recent Labs Lab 06/09/16 0236 06/09/16 0624 06/10/16 0544  NA 143  --  140  K 4.8  --  4.3  CL 102  --  96*  CO2 29  --  32  GLUCOSE 108*  --  110*  BUN 76*  --  41*  CREATININE 11.95*  --  8.70*  CALCIUM 8.2*  --  8.3*  MG  --  2.2  --   PHOS  --  7.2*  --      CBC:  Recent Labs Lab 06/09/16 0236 06/10/16 0544  WBC 8.0 7.7  NEUTROABS 5.5 5.3  HGB 10.6* 10.1*  HCT 31.7* 31.8*  MCV 87.7 88.5  PLT 314 271      Microbiology:  No results found for this or any previous visit (from the past 720 hour(s)).  Coagulation Studies: No results for input(s): LABPROT, INR in the last 72 hours.  Urinalysis: No results for input(s): COLORURINE, LABSPEC, PHURINE,  GLUCOSEU, HGBUR, BILIRUBINUR, KETONESUR, PROTEINUR, UROBILINOGEN, NITRITE, LEUKOCYTESUR in the last 72 hours.  Invalid input(s): APPERANCEUR    Imaging: Dg Chest Port 1 View  Result Date: 06/10/2016 CLINICAL DATA:  Acute respiratory failure. EXAM: PORTABLE CHEST 1 VIEW COMPARISON:  Radiograph of June 09, 2016. FINDINGS: Stable cardiomegaly and central pulmonary vascular congestion is noted. Endotracheal and nasogastric tubes have been removed. Improved bilateral lung opacities is noted consistent with improving pulmonary edema or pneumonia. No pneumothorax is noted. Probable minimal bilateral pleural effusions are noted. Left axillary surgical clips are noted. Bony thorax is unremarkable. IMPRESSION: Stable cardiomegaly with central pulmonary vascular congestion. Mildly decreased bilateral lung opacities are noted consistent with improving edema or pneumonia, although significant residual density remains. Electronically Signed   By: Marijo Conception, M.D.   On: 06/10/2016 07:03   Dg Chest Portable 1 View  Result Date: 06/09/2016 CLINICAL DATA:  Respiratory failure.  Intubation. EXAM: PORTABLE CHEST 1 VIEW COMPARISON:  06/09/2016 at  02:23 FINDINGS: Endotracheal tube is satisfactorily positioned with tip 3.7 cm above the carina. Nasogastric tube extends into the stomach and beyond the inferior edge of the image. Extensive airspace opacities are present bilaterally without significant interval change. Probable small pleural effusions bilaterally. IMPRESSION: 1.  Support equipment appears satisfactorily positioned. 2. No significant interval change in the bilateral airspace opacities. Electronically Signed   By: Andreas Newport M.D.   On: 06/09/2016 06:05   Dg Chest Portable 1 View  Result Date: 06/09/2016 CLINICAL DATA:  55 y/o  F; cough and slight chest pain. EXAM: PORTABLE CHEST 1 VIEW COMPARISON:  05/11/2016 chest radiograph FINDINGS: Stable cardiomegaly given projection and technique.  Interstitial edema and ill-defined opacities in the lung bases may represent mild alveolar edema. Underlying pneumonia is not excluded. Blunted costal diaphragmatic sulci probably represent small effusions. No pneumothorax. No acute osseous abnormality is evident. Postsurgical changes in the left axilla. IMPRESSION: Interstitial pulmonary edema and ill-defined basilar opacities which probably represent mild alveolar edema. Underlying pneumonia is not excluded. Probable small bilateral effusions. Stable cardiomegaly. Electronically Signed   By: Kristine Garbe M.D.   On: 06/09/2016 02:45     Medications:   . nitroGLYCERIN 10 mcg/min (06/09/16 1925)   . amLODipine  5 mg Oral QHS  . chlorhexidine  15 mL Mouth Rinse BID  . cloNIDine  0.1 mg Oral BID  . epoetin (EPOGEN/PROCRIT) injection  4,000 Units Intravenous Q T,Th,Sa-HD  . famotidine (PEPCID) IV  20 mg Intravenous Q24H  . gabapentin  300 mg Oral Daily  . heparin  5,000 Units Subcutaneous Q8H  . insulin aspart  0-9 Units Subcutaneous Q4H  . ipratropium-albuterol  3 mL Nebulization Q6H  . losartan  100 mg Oral QHS  . mouth rinse  15 mL Mouth Rinse q12n4p  . metoprolol succinate  50 mg Oral QHS   sodium chloride, acetaminophen, ALPRAZolam, hydrALAZINE, ondansetron (ZOFRAN) IV  Assessment/ Plan:  55 y.o.African [place] female with ESRD secondary to polycystic kidney hypertension, GERD, peritoneal dialysis h/o peritonitis, arthritis, chronic left hand pain,h/o  ruptured access .  Admitted for Hypertensive emergency and SOB  TTS, [place]   1. End Stage Renal Disease: requiring emergent hemodialysis   - 2800 cc of fluid was removed dialysis - clinically much improved  2. Anemia of chronic kidney disease: hemoglobin 10.1 - epo with dialysis treatments.   3. Acute pulmonary edema:  echo from 01/2016 shows Moderate conc LVH Improved after dialysis  4. Malignant HTN with Pulmonary edema and Acute resp  failure - amlodipine, clonidine, losartan  and metoprolol - dose amlodipine and ARB at night  5. Secondary Hyperparathyroidism: with hyperphosphatemia.  - calcium acetate with meals     LOS: 1 Westin Knotts 11/1/20171:28 PM

## 2016-06-10 NOTE — Progress Notes (Addendum)
Patient Saturations on Room Air at Rest =92 %  Patient Saturations on Room Air while Ambulating 80=%  Patient Saturations on 2 Liters of oxygen while Ambulating =98 %  Please briefly explain why patient needs home oxygen: Chronic CHF

## 2016-06-10 NOTE — Care Management (Signed)
Potential discharge today per MD. O2 assessments pending. Progress note will need to be updated regarding chronic respiratory disease for continuous O2 if patient requires home O2. If no diagnosis then maybe patient will need to weaned from O2 prior to discharge with assistance from flutter valve or IS. Currently there is no diagnosis to pay for home O2. RNCM will continue to follow.

## 2016-06-10 NOTE — Progress Notes (Signed)
Patient has a Diagnosis of Chronic diastolic heart failure ([place]) has been evaluated by Cardiology Patient has shown that she needs oxygen therapy with exertion and ambulation  Patient ambulated around ICU and o2 sats dropped as low as 80% on RA    Monya Kozakiewicz Patricia Pesa, M.D.  [facility]  Medical Director [place] Director [facility] Cardio-Pulmonary Department

## 2016-06-10 NOTE — Progress Notes (Signed)
Discharge instructions given to and reviewed with patient.  Patient verbalized understanding of all discharge instructions including follow up appointment and home oxygen use.  Patient wheeled to her SUV by wheelchair with this RN and fiance at side.  Patient on o2. Patient driving home with her fiance in passenger seat.

## 2016-06-10 NOTE — Care Management (Addendum)
Home O2 arranged with [facility] pending O2 assessment template completion. When this is completed [facility] can deliver portable tank for patient to go home. No home health will be needed per Dr. Mortimer Fries. I spoke with patient and she agrees. She apparently drove herself to [facility] and will be driving herself home at this discharge (per patient). She states her PCP is with [facility] [phone]. Follow up appointment made for 06/12/16 at 1140AM-  Patient aware and agrees.

## 2016-06-10 NOTE — Progress Notes (Signed)
Patient ambulated around ICU and o2 sats dropped as low as 80% on RA, no shortness of breath. 92% o2 sat at rest on RA. This RN made Jasmine Stalker, NP aware and [name], case manager has been called to facilitate home o2.

## 2016-06-10 NOTE — Discharge Summary (Signed)
Physician Discharge Summary  Patient ID: Jasmine Holloway MRN: [id number] DOB/AGE: Nov 12, 1960 55 y.o.  Admit date: 06/09/2016 Discharge date: 06/10/2016    Discharge Diagnoses:        Acute on chronic hypoxic respiratory failure secondary to pulmonary edema requiring       mechanical ventilation-resolved       Asthma       COPD       Hypertensive Urgency-resolved       Malignant Hypertension       Chest Pain-resolved       Diastolic Heart Failure       End Stage Renal Disease       Hemodialysis (T, Th, Sat)       Polycystic Kidney Disease       Anemia of Chronic Kidney Disease       Secondary Hyperparathyroidism with Hyperphospatemia                                                                              DISCHARGE PLAN BY DIAGNOSIS   COPD Plan: Home O2 at 2L during activity and with ambulation Continue prn Albuterol q6hrs for shortness of breath and wheezing Counseled regarding smoking cessation  Asthma Plan: Home O2 at 2L during activity and with ambulation Continue prn Albuterol q6hrs for shortness of breath and wheezing  Diastolic Heart Failure Plan: Monitor daily weights Counseled on when to notify PCP or come to the ER Continue hemodialysis per Nephrology recommendations Limit Na intake  Malignant Hypertension with Pulmonary Edema and Acute Respiratory Failure requiring Mechanical Intubation Hypertensive Urgency-resolved Plan: Home O2 at 2L during activity and with ambulation Continue outpatient amlodipine, clonidine, losartan, lasix, and metoprolol xl Counseled to limit sodium intake and take antihypertensives as prescribed  Secondary Hyperparathyroidism with Hyperphospatemia Plan: Continue calcium acetate and sensipar with meals   Anemia of Chronic Kidney Disease Plan: Per nephrology continue epo with hemodialysis treatment  Monitor for s/sx of bleeding  End Stage Renal Disease Hemodialysis (T, Th, Sat) Polycystic Kidney Disease Plan: Continue  hemodialysis per Nephrology recommendations Monitor urinary output                  DISCHARGE SUMMARY   Jasmine Holloway is a 55 y.o. y/o female with a PMH of ESRD (Hemodialysis T, Th, and Sat), Ruptured AV access left arm, Chronic left arm pain followed by pain clinic, Chronic anemia, Polycystic kidney disease, Hypertension, Neuropathy, Diastolic heart failure, Diabetes mellitus, COPD, and Asthma.  She presented to [facility] ER on 10/31 with c/o shortness of breath onset 10/29.  Per ER notes she reported she went to dialysis on 10/28, however during dialysis they were only able to pull off 1 liter of fluid because she started developing cramps in her bilateral legs.  According to her boyfriend the pt started developing worsening shortness of breath on 10/30 and she has a chronic nonproductive cough he instructed her to go to the ER, however she saw Cardiologist Dr. Rockey Situ instead to establish care.  Once pt arrived back home her shortness of breath worsened she attempted to wait to receive her scheduled outpatient dialysis on Tuesday 10/31, however her symptoms worsened, prompting her visit to the ER on 10/30.  Per ER notes the patient makes very limited urine, she voids once a day and is currently on 80 mg of lasix daily.  In the ER she reported 6/10 chest pain with bilateral leg swelling and shaking.  She was placed on Bipap due to respiratory distress, however she became increasingly agitated with increased work of breath, therefore requiring mechanical intubation.  PCCM contacted to admit pt to ICU due to acute on chronic hypoxic hypercapnic respiratory failure secondary to pulmonary edema requiring mechanical intubation and management of hypertensive urgency.  She was extubated 10/31, however during ambulation on RA on 11-1 her O2 sats decreased to 80%, at rest O2 sats 95% she will need outpatient O2 via nasal canula at 2L during ambulation and during activity due to chronic diastolic heart  failure.  SIGNIFICANT DIAGNOSTIC STUDIES None  SIGNIFICANT EVENTS 10/31-Pt admitted by PCCM to [facility] ICU due to acute on chronic hypoxic respiratory failure secondary to pulmonary edema requiring mechanical intubation and management of hypertensive urgency 10/31-Pt extubated  MICRO DATA  None  ANTIBIOTICS None  CONSULTS Intensivist Nephrology  TUBES / LINES None   Discharge Exam: General: well developed, well nourished AA female, resting in bed, in NAD. Neuro: A&O x 3, non-focal.  HEENT: [place]/AT. PERRL, sclerae anicteric. Cardiovascular: s1s2, RRR, no M/R/G.  Lungs: diminished throughout, respirations even and unlabored.   Abdomen: BS x 4, soft, NT/ND.  Musculoskeletal: No gross deformities, no edema.  Skin: Intact, warm, no rashes, right forearm fistula +bruit and +thrill   Vitals:   06/10/16 0905 06/10/16 1000 06/10/16 1100 06/10/16 1402  BP: (!) 146/95 (!) 151/82 134/78   Pulse:  85 77   Resp:  18 20   Temp:      TempSrc:      SpO2:  96% 98% 97%  Weight:      Height:         Discharge Labs  BMET  Recent Labs Lab 06/09/16 0236 06/09/16 0624 06/10/16 0544  NA 143  --  140  K 4.8  --  4.3  CL 102  --  96*  CO2 29  --  32  GLUCOSE 108*  --  110*  BUN 76*  --  41*  CREATININE 11.95*  --  8.70*  CALCIUM 8.2*  --  8.3*  MG  --  2.2  --   PHOS  --  7.2*  --     CBC  Recent Labs Lab 06/09/16 0236 06/10/16 0544  HGB 10.6* 10.1*  HCT 31.7* 31.8*  WBC 8.0 7.7  PLT 314 271    Anti-Coagulation No results for input(s): INR in the last 168 hours.  Discharge Instructions    For home use only DME oxygen    Complete by:  As directed    Home O2 at 2L via nasal canula during ambulation and during activity   Mode or (Route):  Nasal cannula   Liters per Minute:  2   Oxygen conserving device:  Yes   Oxygen delivery system:  Gas       Follow-up [place] Follow up on 06/12/2016.   Why:  at 11:40AM Contact  information: [place] [address] [phone]        [facility]  [facility] .   Specialty:  Group Home Contact information: [address] [place] [phone]             Medication List    STOP  taking these medications   metoprolol 50 MG tablet Commonly known as:  LOPRESSOR     TAKE these medications   acetaminophen 325 MG tablet Commonly known as:  TYLENOL Take 2 tablets (650 mg total) by mouth every 6 (six) hours as needed for mild pain or headache.   albuterol 108 (90 Base) MCG/ACT inhaler Commonly known as:  PROVENTIL HFA;VENTOLIN HFA Inhale 2 puffs into the lungs every 6 (six) hours as needed for wheezing or shortness of breath.   amLODipine 5 MG tablet Commonly known as:  NORVASC Take 5 mg by mouth daily.   aspirin 81 MG chewable tablet Chew 1 tablet (81 mg total) by mouth daily.   b complex-vitamin c-folic acid 0.8 MG Tabs tablet Take 1 tablet by mouth at bedtime.   calcium acetate 667 MG capsule Commonly known as:  PHOSLO Take 667 mg by mouth 3 (three) times daily with meals.   cloNIDine 0.1 MG tablet Commonly known as:  CATAPRES Take 1 tablet (0.1 mg total) by mouth 2 (two) times daily.   furosemide 80 MG tablet Commonly known as:  LASIX Take 1 tablet (80 mg total) by mouth daily.   gabapentin 300 MG capsule Commonly known as:  NEURONTIN Take 300 mg by mouth daily.   irbesartan 150 MG tablet Commonly known as:  AVAPRO Take 1 tablet by mouth daily.   losartan 25 MG tablet Commonly known as:  COZAAR Take 1 tablet (25 mg total) by mouth daily.   metolazone 5 MG tablet Commonly known as:  ZAROXOLYN Take 1 tablet (5 mg total) by mouth daily as needed.   metoprolol succinate 50 MG 24 hr tablet Commonly known as:  TOPROL-XL Take 1 tablet by mouth daily.   omeprazole 20 MG capsule Commonly known as:  PRILOSEC Take 20 mg by mouth 2 (two) times daily.   oxyCODONE-acetaminophen  10-325 MG tablet Commonly known as:  PERCOCET Take 0.5-1 tablets by mouth daily as needed for pain.   rosuvastatin 5 MG tablet Commonly known as:  CRESTOR Take 5 mg by mouth at bedtime.   SENSIPAR 30 MG tablet Generic drug:  cinacalcet Take 1 tablet by mouth daily.        Disposition: Pt to be discharged home with home O2 at 2L during ambulation and with activity.  Follow-up with PCP in 1 to 2 weeks at [facility]  Discharged Condition: Jasmine Holloway has met maximum benefit of inpatient care and is medically stable and cleared for discharge.  Patient is pending follow up as above.     Marda Stalker, [place] Pager [phone] (please enter 7 digits) PCCM Consult Pager [phone] (please enter 7 digits)

## 2016-07-04 ENCOUNTER — Emergency Department: Payer: Medicare Other

## 2016-07-04 ENCOUNTER — Encounter: Payer: Self-pay | Admitting: *Deleted

## 2016-07-04 ENCOUNTER — Inpatient Hospital Stay
Admission: EM | Admit: 2016-07-04 | Discharge: 2016-07-07 | DRG: 291 | Disposition: A | Discharge status: Home / Self Care | Payer: Medicare Other | Attending: Internal Medicine | Admitting: Internal Medicine

## 2016-07-04 DIAGNOSIS — I5023 Acute on chronic systolic (congestive) heart failure: Secondary | ICD-10-CM | POA: Diagnosis present

## 2016-07-04 DIAGNOSIS — J810 Acute pulmonary edema: Secondary | ICD-10-CM | POA: Diagnosis not present

## 2016-07-04 DIAGNOSIS — N186 End stage renal disease: Secondary | ICD-10-CM | POA: Diagnosis present

## 2016-07-04 DIAGNOSIS — K3184 Gastroparesis: Secondary | ICD-10-CM | POA: Diagnosis present

## 2016-07-04 DIAGNOSIS — Q613 Polycystic kidney, unspecified: Secondary | ICD-10-CM

## 2016-07-04 DIAGNOSIS — I132 Hypertensive heart and chronic kidney disease with heart failure and with stage 5 chronic kidney disease, or end stage renal disease: Principal | ICD-10-CM | POA: Diagnosis present

## 2016-07-04 DIAGNOSIS — Z7982 Long term (current) use of aspirin: Secondary | ICD-10-CM

## 2016-07-04 DIAGNOSIS — Z9981 Dependence on supplemental oxygen: Secondary | ICD-10-CM

## 2016-07-04 DIAGNOSIS — J9601 Acute respiratory failure with hypoxia: Secondary | ICD-10-CM

## 2016-07-04 DIAGNOSIS — Z992 Dependence on renal dialysis: Secondary | ICD-10-CM

## 2016-07-04 DIAGNOSIS — E1122 Type 2 diabetes mellitus with diabetic chronic kidney disease: Secondary | ICD-10-CM | POA: Diagnosis present

## 2016-07-04 DIAGNOSIS — J9621 Acute and chronic respiratory failure with hypoxia: Secondary | ICD-10-CM | POA: Diagnosis present

## 2016-07-04 DIAGNOSIS — Z8673 Personal history of transient ischemic attack (TIA), and cerebral infarction without residual deficits: Secondary | ICD-10-CM

## 2016-07-04 DIAGNOSIS — R4189 Other symptoms and signs involving cognitive functions and awareness: Secondary | ICD-10-CM

## 2016-07-04 DIAGNOSIS — Z87891 Personal history of nicotine dependence: Secondary | ICD-10-CM

## 2016-07-04 DIAGNOSIS — D631 Anemia in chronic kidney disease: Secondary | ICD-10-CM | POA: Diagnosis present

## 2016-07-04 DIAGNOSIS — R51 Headache: Secondary | ICD-10-CM | POA: Diagnosis present

## 2016-07-04 DIAGNOSIS — I5021 Acute systolic (congestive) heart failure: Secondary | ICD-10-CM

## 2016-07-04 DIAGNOSIS — E1143 Type 2 diabetes mellitus with diabetic autonomic (poly)neuropathy: Secondary | ICD-10-CM | POA: Diagnosis present

## 2016-07-04 DIAGNOSIS — J9600 Acute respiratory failure, unspecified whether with hypoxia or hypercapnia: Secondary | ICD-10-CM

## 2016-07-04 DIAGNOSIS — R0602 Shortness of breath: Secondary | ICD-10-CM | POA: Diagnosis present

## 2016-07-04 DIAGNOSIS — Z8249 Family history of ischemic heart disease and other diseases of the circulatory system: Secondary | ICD-10-CM

## 2016-07-04 DIAGNOSIS — K219 Gastro-esophageal reflux disease without esophagitis: Secondary | ICD-10-CM | POA: Diagnosis present

## 2016-07-04 DIAGNOSIS — I161 Hypertensive emergency: Secondary | ICD-10-CM | POA: Diagnosis present

## 2016-07-04 DIAGNOSIS — Z79899 Other long term (current) drug therapy: Secondary | ICD-10-CM | POA: Diagnosis not present

## 2016-07-04 DIAGNOSIS — T50902A Poisoning by unspecified drugs, medicaments and biological substances, intentional self-harm, initial encounter: Secondary | ICD-10-CM | POA: Diagnosis not present

## 2016-07-04 DIAGNOSIS — J449 Chronic obstructive pulmonary disease, unspecified: Secondary | ICD-10-CM | POA: Diagnosis present

## 2016-07-04 DIAGNOSIS — N2581 Secondary hyperparathyroidism of renal origin: Secondary | ICD-10-CM | POA: Diagnosis present

## 2016-07-04 LAB — CBC WITH DIFFERENTIAL/PLATELET
BASOS ABS: 0.1 10*3/uL (ref 0–0.1)
BASOS PCT: 1 %
EOS PCT: 8 %
Eosinophils Absolute: 0.6 10*3/uL (ref 0–0.7)
HEMATOCRIT: 35.8 % (ref 35.0–47.0)
Hemoglobin: 11.9 g/dL — ABNORMAL LOW (ref 12.0–16.0)
Lymphocytes Relative: 28 %
Lymphs Abs: 2.1 10*3/uL (ref 1.0–3.6)
MCH: 29.7 pg (ref 26.0–34.0)
MCHC: 33.2 g/dL (ref 32.0–36.0)
MCV: 89.5 fL (ref 80.0–100.0)
MONO ABS: 0.6 10*3/uL (ref 0.2–0.9)
MONOS PCT: 7 %
NEUTROS ABS: 4.1 10*3/uL (ref 1.4–6.5)
Neutrophils Relative %: 56 %
PLATELETS: 264 10*3/uL (ref 150–440)
RBC: 4 MIL/uL (ref 3.80–5.20)
RDW: 23 % — AB (ref 11.5–14.5)
WBC: 7.6 10*3/uL (ref 3.6–11.0)

## 2016-07-04 LAB — COMPREHENSIVE METABOLIC PANEL
ALK PHOS: 77 U/L (ref 38–126)
ALT: 15 U/L (ref 14–54)
AST: 22 U/L (ref 15–41)
Albumin: 4 g/dL (ref 3.5–5.0)
Anion gap: 16 — ABNORMAL HIGH (ref 5–15)
BILIRUBIN TOTAL: 0.7 mg/dL (ref 0.3–1.2)
BUN: 82 mg/dL — AB (ref 6–20)
CALCIUM: 8 mg/dL — AB (ref 8.9–10.3)
CO2: 23 mmol/L (ref 22–32)
CREATININE: 13.06 mg/dL — AB (ref 0.44–1.00)
Chloride: 100 mmol/L — ABNORMAL LOW (ref 101–111)
GFR, EST AFRICAN AMERICAN: 3 mL/min — AB (ref >60)
GFR, EST NON AFRICAN AMERICAN: 3 mL/min — AB (ref >60)
Glucose, Bld: 100 mg/dL — ABNORMAL HIGH (ref 65–99)
Potassium: 5.4 mmol/L — ABNORMAL HIGH (ref 3.5–5.1)
Sodium: 139 mmol/L (ref 135–145)
TOTAL PROTEIN: 8.6 g/dL — AB (ref 6.5–8.1)

## 2016-07-04 LAB — BASIC METABOLIC PANEL
Anion gap: 12 (ref 5–15)
BUN: 23 mg/dL — AB (ref 6–20)
CO2: 30 mmol/L (ref 22–32)
CREATININE: 4.7 mg/dL — AB (ref 0.44–1.00)
Calcium: 8.7 mg/dL — ABNORMAL LOW (ref 8.9–10.3)
Chloride: 96 mmol/L — ABNORMAL LOW (ref 101–111)
GFR calc Af Amer: 11 mL/min — ABNORMAL LOW (ref >60)
GFR, EST NON AFRICAN AMERICAN: 9 mL/min — AB (ref >60)
Glucose, Bld: 92 mg/dL (ref 65–99)
POTASSIUM: 3.6 mmol/L (ref 3.5–5.1)
SODIUM: 138 mmol/L (ref 135–145)

## 2016-07-04 LAB — TROPONIN I
TROPONIN I: 0.03 ng/mL — AB (ref <0.03)
TROPONIN I: 0.03 ng/mL — AB (ref <0.03)
TROPONIN I: 0.03 ng/mL — AB (ref <0.03)
Troponin I: 0.03 ng/mL (ref <0.03)

## 2016-07-04 LAB — PROTIME-INR
INR: 1.05
Prothrombin Time: 13.7 seconds (ref 11.4–15.2)

## 2016-07-04 LAB — MAGNESIUM: MAGNESIUM: 2 mg/dL (ref 1.7–2.4)

## 2016-07-04 LAB — BRAIN NATRIURETIC PEPTIDE: B NATRIURETIC PEPTIDE 5: 1519 pg/mL — AB (ref 0.0–100.0)

## 2016-07-04 LAB — CK: Total CK: 219 U/L (ref 38–234)

## 2016-07-04 LAB — MRSA PCR SCREENING: MRSA by PCR: NEGATIVE

## 2016-07-04 LAB — GLUCOSE, CAPILLARY
GLUCOSE-CAPILLARY: 121 mg/dL — AB (ref 65–99)
GLUCOSE-CAPILLARY: 94 mg/dL (ref 65–99)
Glucose-Capillary: 92 mg/dL (ref 65–99)
Glucose-Capillary: 92 mg/dL (ref 65–99)
Glucose-Capillary: 139 mg/dL — ABNORMAL HIGH (ref 65–99)

## 2016-07-04 LAB — PHOSPHORUS: Phosphorus: 3 mg/dL (ref 2.5–4.6)

## 2016-07-04 MED ORDER — HYDROMORPHONE HCL 1 MG/ML IJ SOLN
INTRAMUSCULAR | Status: AC
  Administered 2016-07-04: 0.5 mg via INTRAVENOUS
  Filled 2016-07-04: qty 1

## 2016-07-04 MED ORDER — ACETAMINOPHEN 325 MG PO TABS: 325.0000 mg | ORAL_TABLET | Freq: Four times a day (QID) | ORAL | Status: DC | PRN

## 2016-07-04 MED ORDER — LABETALOL HCL 5 MG/ML IV SOLN
0.5000 mg/min | INTRAVENOUS | Status: DC
  Administered 2016-07-04: 0.5 mg/min via INTRAVENOUS
  Filled 2016-07-04 (×2): qty 100

## 2016-07-04 MED ORDER — CLONIDINE HCL 0.1 MG PO TABS
0.1000 mg | ORAL_TABLET | Freq: Two times a day (BID) | ORAL | Status: DC
  Administered 2016-07-04 – 2016-07-07 (×7): 0.1 mg via ORAL
  Filled 2016-07-04 (×7): qty 1

## 2016-07-04 MED ORDER — OXYCODONE HCL 5 MG PO TABS
10.0000 mg | ORAL_TABLET | Freq: Four times a day (QID) | ORAL | Status: DC | PRN
  Administered 2016-07-04 – 2016-07-05 (×2): 10 mg via ORAL
  Filled 2016-07-04 (×2): qty 2

## 2016-07-04 MED ORDER — SODIUM CHLORIDE 0.9 % IV SOLN: 250.0000 mL | INTRAVENOUS | Status: DC | PRN

## 2016-07-04 MED ORDER — IRBESARTAN 150 MG PO TABS
150.0000 mg | ORAL_TABLET | Freq: Every day | ORAL | Status: DC
  Administered 2016-07-04 – 2016-07-05 (×2): 150 mg via ORAL
  Filled 2016-07-04 (×2): qty 1

## 2016-07-04 MED ORDER — FENTANYL CITRATE (PF) 100 MCG/2ML IJ SOLN
INTRAMUSCULAR | Status: AC
  Filled 2016-07-04: qty 2

## 2016-07-04 MED ORDER — IPRATROPIUM-ALBUTEROL 0.5-2.5 (3) MG/3ML IN SOLN
3.0000 mL | Freq: Four times a day (QID) | RESPIRATORY_TRACT | Status: DC
  Administered 2016-07-04 – 2016-07-07 (×13): 3 mL via RESPIRATORY_TRACT
  Filled 2016-07-04 (×13): qty 3

## 2016-07-04 MED ORDER — OXYCODONE-ACETAMINOPHEN 10-325 MG PO TABS: 0.5000 | ORAL_TABLET | Freq: Four times a day (QID) | ORAL | Status: DC | PRN

## 2016-07-04 MED ORDER — CINACALCET HCL 30 MG PO TABS
30.0000 mg | ORAL_TABLET | Freq: Every day | ORAL | Status: DC
  Administered 2016-07-05 – 2016-07-07 (×3): 30 mg via ORAL
  Filled 2016-07-04 (×3): qty 1

## 2016-07-04 MED ORDER — ROSUVASTATIN CALCIUM 10 MG PO TABS
5.0000 mg | ORAL_TABLET | Freq: Every day | ORAL | Status: DC
Start: 2016-07-04 — End: 2016-07-07
  Administered 2016-07-04 – 2016-07-06 (×3): 5 mg via ORAL
  Filled 2016-07-04 (×4): qty 1

## 2016-07-04 MED ORDER — FENTANYL CITRATE (PF) 100 MCG/2ML IJ SOLN
50.0000 ug | Freq: Once | INTRAMUSCULAR | Status: AC
Start: 2016-07-04 — End: 2016-07-04
  Administered 2016-07-04: 50 ug via INTRAVENOUS

## 2016-07-04 MED ORDER — ACETAMINOPHEN 325 MG PO TABS
650.0000 mg | ORAL_TABLET | Freq: Four times a day (QID) | ORAL | Status: DC | PRN
  Administered 2016-07-04: 650 mg via ORAL
  Filled 2016-07-04: qty 2

## 2016-07-04 MED ORDER — METOPROLOL SUCCINATE ER 50 MG PO TB24
50.0000 mg | ORAL_TABLET | Freq: Every day | ORAL | Status: DC
  Administered 2016-07-04: 50 mg via ORAL
  Filled 2016-07-04: qty 1

## 2016-07-04 MED ORDER — ASPIRIN 81 MG PO CHEW
81.0000 mg | CHEWABLE_TABLET | Freq: Every day | ORAL | Status: DC
  Administered 2016-07-04 – 2016-07-07 (×4): 81 mg via ORAL
  Filled 2016-07-04 (×4): qty 1

## 2016-07-04 MED ORDER — AMLODIPINE BESYLATE 5 MG PO TABS
5.0000 mg | ORAL_TABLET | Freq: Every day | ORAL | Status: DC
  Administered 2016-07-04 – 2016-07-05 (×2): 5 mg via ORAL
  Filled 2016-07-04 (×2): qty 1

## 2016-07-04 MED ORDER — HEPARIN SODIUM (PORCINE) 5000 UNIT/ML IJ SOLN
5000.0000 [IU] | Freq: Three times a day (TID) | INTRAMUSCULAR | Status: DC
  Administered 2016-07-04 – 2016-07-07 (×9): 5000 [IU] via SUBCUTANEOUS
  Filled 2016-07-04 (×10): qty 1

## 2016-07-04 MED ORDER — NITROGLYCERIN IN D5W 200-5 MCG/ML-% IV SOLN
0.0000 ug/min | Freq: Once | INTRAVENOUS | Status: DC
  Filled 2016-07-04: qty 250

## 2016-07-04 MED ORDER — HYDROMORPHONE HCL 1 MG/ML IJ SOLN
0.5000 mg | INTRAMUSCULAR | Status: AC
  Administered 2016-07-04: 0.5 mg via INTRAVENOUS
  Filled 2016-07-04: qty 1

## 2016-07-04 MED ORDER — NITROGLYCERIN IN D5W 200-5 MCG/ML-% IV SOLN
0.0000 ug/min | Freq: Once | INTRAVENOUS | Status: AC
  Administered 2016-07-04: 30 ug/min via INTRAVENOUS
  Administered 2016-07-04: 85 ug/min via INTRAVENOUS
  Administered 2016-07-04: 90 ug/min via INTRAVENOUS
  Administered 2016-07-04: 20 ug/min via INTRAVENOUS

## 2016-07-04 MED ORDER — FUROSEMIDE 10 MG/ML IJ SOLN
80.0000 mg | Freq: Once | INTRAMUSCULAR | Status: AC
Start: 2016-07-04 — End: 2016-07-04
  Administered 2016-07-04: 80 mg via INTRAVENOUS
  Filled 2016-07-04: qty 8

## 2016-07-04 MED ORDER — INSULIN ASPART 100 UNIT/ML ~~LOC~~ SOLN
0.0000 [IU] | SUBCUTANEOUS | Status: DC
  Administered 2016-07-04 (×2): 2 [IU] via SUBCUTANEOUS
  Administered 2016-07-05: 4 [IU] via SUBCUTANEOUS
  Filled 2016-07-04 (×3): qty 2

## 2016-07-04 MED ORDER — LORAZEPAM 2 MG/ML IJ SOLN
1.0000 mg | Freq: Once | INTRAMUSCULAR | Status: AC
Start: 2016-07-04 — End: 2016-07-04
  Administered 2016-07-04: 1 mg via INTRAVENOUS

## 2016-07-04 MED ORDER — CHLORHEXIDINE GLUCONATE 0.12 % MT SOLN
15.0000 mL | Freq: Two times a day (BID) | OROMUCOSAL | Status: DC
  Administered 2016-07-04 – 2016-07-05 (×2): 15 mL via OROMUCOSAL
  Filled 2016-07-04 (×3): qty 15

## 2016-07-04 MED ORDER — ONDANSETRON HCL 4 MG/2ML IJ SOLN
4.0000 mg | Freq: Four times a day (QID) | INTRAMUSCULAR | Status: DC | PRN
  Administered 2016-07-04 – 2016-07-07 (×4): 4 mg via INTRAVENOUS
  Filled 2016-07-04 (×4): qty 2

## 2016-07-04 MED ORDER — METOCLOPRAMIDE HCL 5 MG/ML IJ SOLN
5.0000 mg | Freq: Four times a day (QID) | INTRAMUSCULAR | Status: DC | PRN
  Administered 2016-07-04: 5 mg via INTRAVENOUS
  Filled 2016-07-04: qty 2

## 2016-07-04 MED ORDER — PANTOPRAZOLE SODIUM 40 MG PO TBEC
40.0000 mg | DELAYED_RELEASE_TABLET | Freq: Every day | ORAL | Status: DC
  Administered 2016-07-04 – 2016-07-07 (×4): 40 mg via ORAL
  Filled 2016-07-04 (×4): qty 1

## 2016-07-04 MED ORDER — ORAL CARE MOUTH RINSE: 15.0000 mL | Freq: Two times a day (BID) | OROMUCOSAL | Status: DC

## 2016-07-04 MED ORDER — LORAZEPAM 2 MG/ML IJ SOLN
0.5000 mg | INTRAMUSCULAR | Status: DC
  Administered 2016-07-04 – 2016-07-06 (×9): 0.5 mg via INTRAVENOUS
  Filled 2016-07-04 (×9): qty 1

## 2016-07-04 NOTE — Progress Notes (Signed)
Subjective:  Patient known to our practice from outpatient. She dialyzes at [facility] dialysis [employer] on TTS 1 shift She presents for acute onset of shortness of breath. Her last dialysis was Wednesday. Currently requiring BiPAP therefore information is limited. She does admit to eating somewhat excessively over the holiday weekend Urgent hemodialysis is requested Upon presentation, her blood pressure was severely elevated at 187/117 She was placed on nitroglycerin drip. Blood pressure at present is 192/92  Objective:  Vital signs in last 24 hours:  Temp:  [97.5 F (36.4 C)] 97.5 F (36.4 C) (11/25 0930) Pulse Rate:  [66-107] 76 (11/25 1000) Resp:  [13-38] 18 (11/25 1000) BP: (170-234)/(86-190) 192/92 (11/25 1000) SpO2:  [89 %-100 %] 97 % (11/25 0930) FiO2 (%):  [30 %] 30 % (11/25 0800) Weight:  [81.6 kg (180 lb)-85.2 kg (187 lb 13.3 oz)] 85.2 kg (187 lb 13.3 oz) (11/25 0930)  Weight change:  Filed Weights   07/04/16 0422 07/04/16 0930  Weight: 81.6 kg (180 lb) 85.2 kg (187 lb 13.3 oz)    Intake/Output:    Intake/Output Summary (Last 24 hours) at 07/04/16 1016 Last data filed at 07/04/16 0800  Gross per 24 hour  Intake            98.18 ml  Output                0 ml  Net            98.18 ml     Physical Exam: General: Chronically ill-appearing, lying in the bed   HEENT BiPAP mask in place   Neck Supple   Pulm/lungs Bilateral diffuse crackles , NIPPV  CVS/Heart Tachycardic, no rub or gallop   Abdomen:  Soft, nontender   Extremities: + Pitting edema bilaterally   Neurologic: Alert, able to follow commands   Skin: No acute rashes   Access: Right arm AV fistula        Basic Metabolic Panel:   Recent Labs Lab 07/04/16 0425  NA 139  K 5.4*  CL 100*  CO2 23  GLUCOSE 100*  BUN 82*  CREATININE 13.06*  CALCIUM 8.0*     CBC:  Recent Labs Lab 07/04/16 0425  WBC 7.6  NEUTROABS 4.1  HGB 11.9*  HCT 35.8  MCV 89.5  PLT 264       Microbiology:  No results found for this or any previous visit (from the past 720 hour(s)).  Coagulation Studies:  Recent Labs  07/04/16 0425  LABPROT 13.7  INR 1.05    Urinalysis: No results for input(s): COLORURINE, LABSPEC, PHURINE, GLUCOSEU, HGBUR, BILIRUBINUR, KETONESUR, PROTEINUR, UROBILINOGEN, NITRITE, LEUKOCYTESUR in the last 72 hours.  Invalid input(s): APPERANCEUR    Imaging: Dg Chest Portable 1 View  Result Date: 07/04/2016 CLINICAL DATA:  55 y/o F; severe shortness of breath. History of congestive heart failure on dialysis. EXAM: PORTABLE CHEST 1 VIEW COMPARISON:  06/10/2016 chest radiograph. FINDINGS: Interstitial markings and patchy opacities of the lungs probably represents moderate pulmonary edema. Stable cardiomegaly. Possible small left effusion. No pneumothorax. Left axillary surgical clips. Bones are unremarkable. IMPRESSION: Moderate pulmonary edema. Stable cardiomegaly. Possible small left effusion. Electronically Signed   By: Kristine Garbe M.D.   On: 07/04/2016 04:43     Medications:    . amLODipine  5 mg Oral Daily  . aspirin  81 mg Oral Daily  . chlorhexidine  15 mL Mouth Rinse BID  . cinacalcet  30 mg Oral Q breakfast  . cloNIDine  0.1 mg Oral BID  . heparin  5,000 Units Subcutaneous Q8H  . insulin aspart  0-15 Units Subcutaneous Q4H  . ipratropium-albuterol  3 mL Nebulization Q6H  . irbesartan  150 mg Oral Daily  . LORazepam  0.5 mg Intravenous Q4H  . mouth rinse  15 mL Mouth Rinse q12n4p  . metoprolol succinate  50 mg Oral Daily  . nitroGLYCERIN  0-200 mcg/min Intravenous Once  . pantoprazole  40 mg Oral Daily  . rosuvastatin  5 mg Oral QHS   sodium chloride, oxyCODONE **AND** acetaminophen, acetaminophen, metoCLOPramide (REGLAN) injection, ondansetron (ZOFRAN) IV  Assessment/ Plan:  55 y.o. African-American female with end-stage renal disease secondary to polycystic kidney disease, severe hypertension, GERD, peritoneal  dialysis and history of peritonitis, arthritis, chronic left hand pain, history of ruptured AV access Presents for acute pulmonary edema and hypertensive emergency  TTS, [place]   1. End Stage Renal Disease: requiring emergent hemodialysis   - UF goal -4-5 kg as tlerated - outpatient EDW 80 kg. - Admission weight 85.2 kg  2. Anemia of chronic kidney disease: hemoglobin 11.9 - epo with dialysis treatments once BP improves.   3. Acute pulmonary edema:  echo from 01/2016 shows Moderate conc LVH - Urgent HD  4. Malignant HTN with Pulmonary edema and Acute resp failure - currently requiring iv NTG drip - given home BP meds - change to labetalol drip as patient is c/o headache from NTG drip  5. Secondary Hyperparathyroidism: with hyperphosphatemia.  - calcium acetate with meals when able to take PO    LOS: 0 Mackenna Kamer 11/25/201710:16 AM

## 2016-07-04 NOTE — Progress Notes (Signed)
Pt on 2L [place] in no distress. Bipap on standby

## 2016-07-04 NOTE — ED Notes (Signed)
md states to start ntg drip at 32mcg/min and titrate down.

## 2016-07-04 NOTE — ED Notes (Signed)
Pt with jvd noted, right lower arm dialysis shunt with bruit and thrill. Pt with bilateral lower leg edema noted from ankles to knee, non-pitting. Pt with edema noted to face around bipap mask. Skin normal color warm and dry. Pt is able to speak in full sentences around bipap mask.

## 2016-07-04 NOTE — Progress Notes (Signed)
Emergent dialysis started. Dr Candiss Norse here. C/o headache after NTG gtt started. No neuro deficits. Ativan for anxiety.

## 2016-07-04 NOTE — Progress Notes (Signed)
HD COMPLETED  

## 2016-07-04 NOTE — ED Triage Notes (Signed)
Pt with resp distress, pt arrives on bipap. Pt gets dialysis on tue, thur, sat. Right lower arm dialysis access. Pt received one inch ntg past to left chest via ems. Lungs with coarse crackles in all fields.

## 2016-07-04 NOTE — Care Management Note (Addendum)
Case Management Note  Patient Details  Name: Danielly Ackerley MRN: [id number] Date of Birth: 12-26-1960  Subjective/Objective:      Admitted with pulmonary edema. Hx CHF, Asthma, COPD. Dialysis T-T-S. PCP at [facility] on [address].  PCP and Pharmacy=[facility].  Chronic home oxygen supplied by [facility].  Anticipate home with home health. No current home health services. Case Management will follow for discharge planning.            Action/Plan:   Expected Discharge Date:                  Expected Discharge Plan:     In-House Referral:     Discharge planning Services     Post Acute Care Choice:    Choice offered to:     DME Arranged:    DME Agency:     HH Arranged:    HH Agency:     Status of Service:     If discussed at [employer] of Stay Meetings, dates discussed:    Additional Comments:  Jimmy Stipes A, RN 07/04/2016, 8:30 AM

## 2016-07-04 NOTE — Progress Notes (Signed)
HD STARTED  

## 2016-07-04 NOTE — Progress Notes (Signed)
POST DIALYSIS ASSESSMENT 

## 2016-07-04 NOTE — ED Notes (Addendum)
ntg drip increased to 31mcg/min at this time.

## 2016-07-04 NOTE — Progress Notes (Signed)
PRE DIALYSIS ASSESSMENT 

## 2016-07-04 NOTE — ED Provider Notes (Signed)
[facility] Emergency Department Provider Note  ____________________________________________   First MD Initiated Contact with Patient 07/04/16 0422     (approximate)  I have reviewed the triage vital signs and the nursing notes.   HISTORY  Chief Complaint Respiratory Distress  Level V caveat-history is limited by severe respiratory distress  HPI Jasmine Holloway is a 55 y.o. female with history of COPD, end-stage renal disease on dialysis ([place]), and CHF who presents by EMS in severe respiratory distress.  she speaks a few words at a time but confirms that she had dialysis yesterday as planned.  She says that her breathing is being getting worse over the last 24 hours, gradually worsening over time, and is currently severe.  She has been taking her regular medications including her Lasix because she does still produce some urine. She denies chest pain but has discomfort due to the increased work of breathing.  She states she can feel the fluid in her lungs and is begging [name] to get it out.  She has some pitting edema in her lower extremities.she is in severe distress and extremely anxious as well.  She denies recent illness and denies fever/chills, vomiting, abdominal pain.   Past Medical History:  Diagnosis Date  . Asthma   . COPD (chronic obstructive pulmonary disease) ([place])   . Diabetes mellitus without complication ([place])   . Diastolic heart failure ([place])   . ESRD (end stage renal disease) on dialysis ([place])   . Hypertension   . Neuropathy ([place])   . Polycystic kidney disease   . Renal insufficiency     Patient Active Problem List   Diagnosis Date Noted  . Acute respiratory failure ([place]) 06/09/2016  . Atypical chest pain 06/08/2016  . Chronic pain syndrome 06/08/2016  . Anemia in stage 4 chronic kidney disease ([place]) 06/08/2016  . CHF exacerbation ([place]) 04/28/2016  . Epigastric pain 03/28/2016  . Acute CHF (congestive heart failure) ([place]) 03/28/2016   . CHF (congestive heart failure) ([place]) 03/28/2016  . Chronic diastolic heart failure ([place]) 02/04/2016  . Hoarseness of voice 02/04/2016  . Acute on chronic diastolic CHF (congestive heart failure) ([place]) 12/22/2015  . ESRD on dialysis ([place]) 12/22/2015  . Essential hypertension 12/22/2015  . Hyperlipidemia 12/22/2015    Past Surgical History:  Procedure Laterality Date  . ARTERIOVENOUS GRAFT PLACEMENT Right    x3 (R forearm currently used for access)  . COLON SURGERY    . VEIN HARVEST      Prior to Admission medications   Medication Sig Start Date End Date Taking? Authorizing Provider  acetaminophen (TYLENOL) 325 MG tablet Take 2 tablets (650 mg total) by mouth every 6 (six) hours as needed for mild pain or headache. 03/18/16   Nicholes Mango, MD  albuterol (PROVENTIL HFA;VENTOLIN HFA) 108 (90 Base) MCG/ACT inhaler Inhale 2 puffs into the lungs every 6 (six) hours as needed for wheezing or shortness of breath. 03/05/16 06/09/16  Alisa Graff, FNP  amLODipine (NORVASC) 5 MG tablet Take 5 mg by mouth daily.     Historical Provider, MD  aspirin 81 MG chewable tablet Chew 1 tablet (81 mg total) by mouth daily. 01/17/16   [place] Lighter, MD  b complex-vitamin c-folic acid (NEPHRO-VITE) 0.8 MG TABS tablet Take 1 tablet by mouth at bedtime.    Historical Provider, MD  calcium acetate (PHOSLO) 667 MG capsule Take 667 mg by mouth 3 (three) times daily with meals.     Historical Provider, MD  cloNIDine (CATAPRES)  0.1 MG tablet Take 1 tablet (0.1 mg total) by mouth 2 (two) times daily. 01/17/16   [place] Lighter, MD  furosemide (LASIX) 80 MG tablet Take 1 tablet (80 mg total) by mouth daily. 03/05/16 06/09/16  Alisa Graff, FNP  gabapentin (NEURONTIN) 300 MG capsule Take 300 mg by mouth daily.     Historical Provider, MD  irbesartan (AVAPRO) 150 MG tablet Take 1 tablet by mouth daily. 05/20/16   Historical Provider, MD  losartan (COZAAR) 25 MG tablet Take 1 tablet (25 mg total) by mouth daily.  05/13/16   Vaughan Basta, MD  metolazone (ZAROXOLYN) 5 MG tablet Take 1 tablet (5 mg total) by mouth daily as needed. 06/08/16 09/06/16  Minna Merritts, MD  metoprolol succinate (TOPROL-XL) 50 MG 24 hr tablet Take 1 tablet by mouth daily. 06/05/16   Historical Provider, MD  omeprazole (PRILOSEC) 20 MG capsule Take 20 mg by mouth 2 (two) times daily.     Historical Provider, MD  oxyCODONE-acetaminophen (PERCOCET) 10-325 MG tablet Take 0.5-1 tablets by mouth daily as needed for pain. 03/18/16   Historical Provider, MD  rosuvastatin (CRESTOR) 5 MG tablet Take 5 mg by mouth at bedtime.     Historical Provider, MD  SENSIPAR 30 MG tablet Take 1 tablet by mouth daily. 06/05/16   Historical Provider, MD    Allergies Morphine and related; Vicodin [hydrocodone-acetaminophen]; Buprenorphine hcl; and Codeine  Family History  Problem Relation Age of Onset  . Hypertension Brother   . Heart failure Brother   . Hypertension Mother   . Heart disease Mother     Social History Social History  Substance Use Topics  . Smoking status: Former Smoker    Packs/day: 0.25    Quit date: 08/11/2015  . Smokeless tobacco: Never Used  . Alcohol use No    Review of Systems level V caveat-cannot obtain full review of systems due to severe respiratory distress/critical illness ____________________________________________   PHYSICAL EXAM:  VITAL SIGNS: ED Triage Vitals [07/04/16 0422]  Enc Vitals Group     BP (!) 187/117     Pulse Rate 96     Resp (!) 32     Temp      Temp Source Axillary     SpO2 100 %     Weight 180 lb (81.6 kg)     Height 5\' 3"  (1.6 m)     Head Circumference      Peak Flow      Pain Score 10     Pain Loc      Pain Edu?      Excl. in [place]?     Constitutional: Alert and oriented. severe respiratory distress, anxious, on CPAP Eyes: Conjunctivae are normal. PERRL. EOMI. Head: Atraumatic. Neck: No meningeal signs.   Cardiovascular: tachycardia, regular rhythm. Good peripheral  circulation. Grossly normal heart sounds. Respiratory: ncreased respiratory effort with retractions. Thick and wet lung sounds throughout Gastrointestinal: Soft and nontender. No distention.  Musculoskeletal: trace pitting edema in bilateral lower extremities. No gross deformities of extremities. Neurologic:  Normal speech and language. No gross focal neurologic deficits are appreciated.  Skin:  Skin is warm, dry and intact. No rash noted. Psychiatric: Mood and affect are anxious  ____________________________________________   LABS (all labs ordered are listed, but only abnormal results are displayed)  Labs Reviewed  COMPREHENSIVE METABOLIC PANEL - Abnormal; Notable for the following:       Result Value   Potassium 5.4 (*)    Chloride 100 (*)  Glucose, Bld 100 (*)    BUN 82 (*)    Creatinine, Ser 13.06 (*)    Calcium 8.0 (*)    Total Protein 8.6 (*)    GFR calc non Af Amer 3 (*)    GFR calc Af Amer 3 (*)    Anion gap 16 (*)    All other components within normal limits  TROPONIN I - Abnormal; Notable for the following:    Troponin I 0.03 (*)    All other components within normal limits  CBC WITH DIFFERENTIAL/PLATELET - Abnormal; Notable for the following:    Hemoglobin 11.9 (*)    RDW 23.0 (*)    All other components within normal limits  PROTIME-INR  BRAIN NATRIURETIC PEPTIDE   ____________________________________________  EKG  ED ECG REPORT I, Zayne Marovich, the attending physician, personally viewed and interpreted this ECG.  Date: 07/04/2016 EKG Time: 4:23 AM Rate: 98 Rhythm: normal sinus rhythm QRS Axis: normal Intervals: normal ST/T Wave abnormalities: Non-specific ST segment / T-wave changes, but no evidence of acute ischemia. Conduction Disturbances: none Narrative Interpretation: heavy artifact is present due to respiratory distress, but no evidence of acute ischemia  ____________________________________________  RADIOLOGY   Dg Chest Portable 1  View  Result Date: 07/04/2016 CLINICAL DATA:  55 y/o F; severe shortness of breath. History of congestive heart failure on dialysis. EXAM: PORTABLE CHEST 1 VIEW COMPARISON:  06/10/2016 chest radiograph. FINDINGS: Interstitial markings and patchy opacities of the lungs probably represents moderate pulmonary edema. Stable cardiomegaly. Possible small left effusion. No pneumothorax. Left axillary surgical clips. Bones are unremarkable. IMPRESSION: Moderate pulmonary edema. Stable cardiomegaly. Possible small left effusion. Electronically Signed   By: Kristine Garbe M.D.   On: 07/04/2016 04:43    ____________________________________________   PROCEDURES  Procedure(s) performed:   .Critical Care Performed by: Hinda Kehr Authorized by: Hinda Kehr   Critical care provider statement:    Critical care time (minutes):  45   Critical care time was exclusive of:  Separately billable procedures and treating other patients   Critical care was necessary to treat or prevent imminent or life-threatening deterioration of the following conditions:  Respiratory failure   Critical care was time spent personally by me on the following activities:  Development of treatment plan with patient or surrogate, discussions with consultants, evaluation of patient's response to treatment, examination of patient, obtaining history from patient or surrogate, ordering and performing treatments and interventions, ordering and review of laboratory studies, ordering and review of radiographic studies, pulse oximetry, re-evaluation of patient's condition and review of old charts      Critical Care performed: Yes, see critical care procedure note(s) ____________________________________________   INITIAL IMPRESSION / [place] / ED COURSE  Pertinent labs & imaging results that were available during my care of the patient were reviewed by me and considered in my medical decision making (see chart  for details).     Clinical Course as of Jul 04 457  Sat Jul 04, 2016  2409 Severe respiratory distress with a history of CHF and COPD.  Currently her lungs sound wet and she is extremely hypertensive leading towards a diagnosis of flash pulmonary edema.  Removing NTG paste, initiating NTG gtt starting at 20 mcg/min.  Bipap starting immediately. Ativan for anxiety.  Labs pending, CXR pending, watching closely.  [CF]  0448 Titrating up rapidly on NTG due to rising BP.  Moderate pulmonary edema on CXR.  Calling nephrology for emergent dialysis.  [CF]  [phone] Spoke with  Dr. Candiss Norse who will alert his team to the need for emergent dialysis.  Then spoke with the [facility] ICU doctor, explained the situation, and they will be coming down immediately to admit the patient and begin the process for dialysis.  [CF]  0458 Potassium: (!) 5.4 [CF]    Clinical Course User Index [CF] Hinda Kehr, MD    ____________________________________________  FINAL CLINICAL IMPRESSION(S) / ED DIAGNOSES  Final diagnoses:  Acute respiratory failure, unspecified whether with hypoxia or hypercapnia (HCC)  Acute pulmonary edema (HCC)  ESRD (end stage renal disease) on dialysis ([place])  Acute systolic congestive heart failure ([place])  Hypertensive emergency     MEDICATIONS GIVEN DURING THIS VISIT:  Medications  furosemide (LASIX) injection 80 mg (not administered)  fentaNYL (SUBLIMAZE) 100 MCG/2ML injection (not administered)  fentaNYL (SUBLIMAZE) injection 50 mcg (not administered)  LORazepam (ATIVAN) injection 1 mg (1 mg Intravenous Given 07/04/16 0428)  nitroGLYCERIN 50 mg in dextrose 5 % 250 mL (0.2 mg/mL) infusion (30 mcg/min Intravenous New Bag/Given 07/04/16 0442)     NEW OUTPATIENT MEDICATIONS STARTED DURING THIS VISIT:  New Prescriptions   No medications on file    Modified Medications   No medications on file    Discontinued Medications   No medications on file     Note:  This document was  prepared using Dragon voice recognition software and may include unintentional dictation errors.    Hinda Kehr, MD 07/04/16 [phone]

## 2016-07-04 NOTE — ED Notes (Signed)
Pt assisted with bedpan for bm.

## 2016-07-04 NOTE — ED Notes (Signed)
Pt with improved resp rate. Pt states feels more relaxed, continues to complain of headache from ntg.

## 2016-07-04 NOTE — Progress Notes (Signed)
Oxy IR given earlier for c/o severe headache.  Labetolol gtt started at 1045 and Ntg gtt transitioned off. BP much more manageable with labetolol gtt. Headache subsided after NTG gtt off. Dialysis continues. Some problems with anxiety at times.Very talkative. BIPAP off now and sats 96% on 2L/Jasmine Holloway

## 2016-07-04 NOTE — Progress Notes (Signed)
Much better after dialysis. Reported 4 Liters removed. Lungs clear and diminished bibasilar. Sats good on 2 L/[place].. Off labetolol gtt. No headache. No nausea. Appetite good. Transferred to Step Down status.

## 2016-07-04 NOTE — H&P (Addendum)
PULMONARY / CRITICAL CARE MEDICINE   Name: Jasmine Holloway MRN: [id number] DOB: [birth year]    ADMISSION DATE:  07/04/2016  REFERRING MD:  ED  CHIEF COMPLAINT:  'I can't breath"  HISTORY OF PRESENT ILLNESS:  This is a 55 year old African-American female with a past medical history of COPD, asthma, type 2 diabetes, end-stage renal disease on dialysis, diastolic heart failure, hypertension, CVA, diabetic neuropathy, and history of polycystic kidney disease who presents with acute respiratory distress. This patient is well-known to our service and was discharged on 06/10/2016 after hospitalization for acute on chronic hypoxic respiratory failure secondary to pulmonary edema. She presents today with similar symptoms. She states that she went for dialysis and could not tolerated dialysis due to severe muscle cramping have an abbreviated treatment was performed. Upon returning home, her respiratory status continued to worsen and it got to the point where she couldn't breathe, hence she called EMS. She is not anuric and takes Lasix at home. She reports taking all medications as prescribed. However, her blood pressure was severely elevated. Upon ED presentation. She is also complaining of nausea, vomiting and abdominal pain. PCCM was called to admit the patient for emergent dialysis and for blood pressure control. She is currently complaining of a headache which she attributes to the nitroglycerin infusion, but she is getting far. Blood pressure. Describes headache as generalized, 10 on 10 in intensity, no aggravating or relieving factors, pain does not radiate, and no photophobia. Associated symptoms include nausea and vomiting. She reports having similar symptoms with previous hypertensive crisis. She goes to dialysis Tuesday, Thursday and Saturday   PAST MEDICAL HISTORY :  She  has a past medical history of Asthma; COPD (chronic obstructive pulmonary disease) ([place]); Diabetes mellitus without complication ([place]);  Diastolic heart failure ([place]); ESRD (end stage renal disease) on dialysis ([place]); Hypertension; Neuropathy ([place]); Polycystic kidney disease; and Renal insufficiency.  PAST SURGICAL HISTORY: She  has a past surgical history that includes Arteriovenous graft placement (Right); Vein harvest; and Colon surgery.  Allergies  Allergen Reactions  . Morphine And Related Nausea And Vomiting  . Vicodin [Hydrocodone-Acetaminophen] Nausea And Vomiting  . Buprenorphine Hcl Nausea And Vomiting  . Codeine Nausea And Vomiting    No current facility-administered medications on file prior to encounter.    Current Outpatient Prescriptions on File Prior to Encounter  Medication Sig  . acetaminophen (TYLENOL) 325 MG tablet Take 2 tablets (650 mg total) by mouth every 6 (six) hours as needed for mild pain or headache.  Marland Kitchen amLODipine (NORVASC) 5 MG tablet Take 5 mg by mouth daily.   Marland Kitchen aspirin 81 MG chewable tablet Chew 1 tablet (81 mg total) by mouth daily.  Marland Kitchen b complex-vitamin c-folic acid (NEPHRO-VITE) 0.8 MG TABS tablet Take 1 tablet by mouth at bedtime.  . cloNIDine (CATAPRES) 0.1 MG tablet Take 1 tablet (0.1 mg total) by mouth 2 (two) times daily.  . furosemide (LASIX) 80 MG tablet Take 1 tablet (80 mg total) by mouth daily.  Marland Kitchen gabapentin (NEURONTIN) 300 MG capsule Take 300 mg by mouth 2 (two) times daily.   . irbesartan (AVAPRO) 150 MG tablet Take 1 tablet by mouth daily.  . metolazone (ZAROXOLYN) 5 MG tablet Take 1 tablet (5 mg total) by mouth daily as needed.  . metoprolol succinate (TOPROL-XL) 50 MG 24 hr tablet Take 1 tablet by mouth daily.  Marland Kitchen omeprazole (PRILOSEC) 20 MG capsule Take 20 mg by mouth 2 (two) times daily.     FAMILY HISTORY:  Her  indicated that her mother is deceased. She indicated that her father is deceased. She indicated that her brother is alive.    SOCIAL HISTORY: She  reports that she quit smoking about 10 months ago. She smoked 0.25 packs per day. She has never used smokeless  tobacco. She reports that she does not drink alcohol or use drugs.  REVIEW OF SYSTEMS:   Constitutional: Negative for fever and chills.  HENT: Negative for congestion and rhinorrhea.  Eyes: Negative for redness and visual disturbance.  Respiratory: Positive for shortness of breath and wheezing.  Cardiovascular: Negative for chest pain and palpitations.  Gastrointestinal: Positive  for nausea , vomiting and abdominal pain , but negative for Loose stools Genitourinary: Negative for dysuria and urgency.  Endocrine: Denies polyuria, polyphagia and heat intolerance Musculoskeletal: Negative for myalgias and arthralgias.  Skin: Negative for pallor and wound.  Neurological: Negative for dizziness , but positive for headaches   SUBJECTIVE:   VITAL SIGNS: BP (!) 184/101   Pulse 78   Resp (!) 24   Ht 5\' 3"  (1.6 m)   Wt 180 lb (81.6 kg)   SpO2 93%   BMI 31.89 kg/m   HEMODYNAMICS:    VENTILATOR SETTINGS: FiO2 (%):  [30 %] 30 %  INTAKE / OUTPUT: No intake/output data recorded.  PHYSICAL EXAMINATION: General: Chronically ill-looking, in moderate to severe distress Neuro: Alert to person, place and time, speech is normal, moves all extremities, grip strength is normal in upper extremities and lower extremities, no focal deficits HEENT: PERRLA, nasal passages without discharge, oral mucosa pink, trachea midline. Cardiovascular: Rate and rhythm regular, S1, S2, no murmur, regurg or gallop,  moderate JVD Lungs: Moderate increase in work of breathing, Fine crackles in anterior and posterior lung fields, mild expiratory wheezes, no rhonchi. Abdomen: Nondistended, soft, nontender on palpation, normal bowel sounds   Musculoskeletal: No visible deformities, positive range of motion in upper and lower extremities Skin: Murmur and dry  LABS:  BMET  Recent Labs Lab 07/04/16 0425  NA 139  K 5.4*  CL 100*  CO2 23  BUN 82*  CREATININE 13.06*  GLUCOSE 100*    Electrolytes  Recent  Labs Lab 07/04/16 0425  CALCIUM 8.0*    CBC  Recent Labs Lab 07/04/16 0425  WBC 7.6  HGB 11.9*  HCT 35.8  PLT 264    Coag's  Recent Labs Lab 07/04/16 0425  INR 1.05    Sepsis Markers No results for input(s): LATICACIDVEN, PROCALCITON, O2SATVEN in the last 168 hours.  ABG No results for input(s): PHART, PCO2ART, PO2ART in the last 168 hours.  Liver Enzymes  Recent Labs Lab 07/04/16 0425  AST 22  ALT 15  ALKPHOS 77  BILITOT 0.7  ALBUMIN 4.0    Cardiac Enzymes  Recent Labs Lab 07/04/16 0425  TROPONINI 0.03*    Glucose No results for input(s): GLUCAP in the last 168 hours.  Imaging Dg Chest Portable 1 View  Result Date: 07/04/2016 CLINICAL DATA:  55 y/o F; severe shortness of breath. History of congestive heart failure on dialysis. EXAM: PORTABLE CHEST 1 VIEW COMPARISON:  06/10/2016 chest radiograph. FINDINGS: Interstitial markings and patchy opacities of the lungs probably represents moderate pulmonary edema. Stable cardiomegaly. Possible small left effusion. No pneumothorax. Left axillary surgical clips. Bones are unremarkable. IMPRESSION: Moderate pulmonary edema. Stable cardiomegaly. Possible small left effusion. Electronically Signed   By: Kristine Garbe M.D.   On: 07/04/2016 04:43    STUDIES:  None  CULTURES: None  ANTIBIOTICS: None  SIGNIFICANT EVENTS: 06/10/2016: Discharge from the hospital following hospitalization for acute on chronic respiratory failure secondary to pulmonary edema. 07/04/2016: Readmitted with acute respiratory failure necessitating BiPAP, pulmonary edema, and hypertensive crisis  LINES/TUBES: Peripheral IVs  DISCUSSION: This is a 55 year old African-American female with end-stage renal disease, non-anuric, presenting with recurrent pulmonary edema, acute on chronic respiratory failure secondary to pulmonary edema, and hypertensive crisis secondary to nonadherence  ASSESSMENT /  PLAN:  PULMONARY A: Acute on chronic respiratory failure secondary to pulmonary edema Acute pulmonary edema secondary to volume overload and missed dialysis P:   Lasix 80 mg IV given in the ED BiPAP with current settings, titrate to keep SPO2 greater than 90%. Hemodialysis per nephrology Portable chest x-ray in the morning  CARDIOVASCULAR A:  Hypertensive crisis secondary to nonadherence and volume overload P:  Hemodynamic monitoring per ICU protocol IV diuresis Resume all home blood pressure medications. Nitroglycerin infusion.  RENAL A:   End-stage renal disease on dialysis P:   Nephrology following. Hemodialysis per nephrology Monitor and correct electrolytes  GASTROINTESTINAL A:   Nausea and vomiting Abdominal pain, likely secondary to diabetic gastroparesis P:   Zofran and Reglan as needed. Oral intake as tolerated  HEMATOLOGIC A:   Mild anemia secondary to chronic disease P:  Monitor hemoglobin and hematocrit Transfuse if hemoglobin less than 7  INFECTIOUS A:   No acute issues P:   Monitor WBC and fever curve  ENDOCRINE A:   Type 2 DM P:   Blood glucose monitoring with SSI coverage  NEUROLOGIC A:   Headache secondary to severely elevated blood pressure and side effects of nitroglycerin infusion P:   RASS goal: Not applicable. Monitor neurologic status. Tylenol as needed for headache. Optimize blood pressure control with oral antihypertensives and nitroglycerin infusion   FAMILY  - Updates: Patient updated on current treatment plan. Awaiting dialysis. Further changes in treatment plan pending clinical course and diagnostics. Plan of care discussed with Dr. Ashby Dawes. Patient will need care coordination with primary care provider and outpatient dialysis center prior to discharge to avoid another readmission  - Inter-disciplinary family meet or Palliative Care meeting due by:  day [place]. [facility] ANP-BC Pulmonary and Critical Care  Medicine [facility] Pager [phone] or [phone] 07/04/2016, 6:09 AM  Pt seen and examined with NP agree with findings, assessment and plan as amended.  Lungs CTA,  Marda Stalker, M.D.  07/04/2016  Critical Care Attestation.  I have personally obtained a history, examined the patient, evaluated laboratory and imaging results, formulated the assessment and plan and placed orders. The Patient requires high complexity decision making for assessment and support, frequent evaluation and titration of therapies, application of advanced monitoring technologies and extensive interpretation of multiple databases. The patient has critical illness that could lead imminently to failure of 1 or more organ systems and requires the highest level of physician preparedness to intervene.  Critical Care Time devoted to patient care services described in this note is 45 minutes and is exclusive of time spent in procedures.

## 2016-07-05 DIAGNOSIS — E8779 Other fluid overload: Secondary | ICD-10-CM

## 2016-07-05 LAB — GLUCOSE, CAPILLARY
GLUCOSE-CAPILLARY: 113 mg/dL — AB (ref 65–99)
GLUCOSE-CAPILLARY: 131 mg/dL — AB (ref 65–99)
Glucose-Capillary: 96 mg/dL (ref 65–99)
Glucose-Capillary: 97 mg/dL (ref 65–99)
Glucose-Capillary: 99 mg/dL (ref 65–99)

## 2016-07-05 MED ORDER — LABETALOL HCL 5 MG/ML IV SOLN
10.0000 mg | Freq: Once | INTRAVENOUS | Status: AC
  Administered 2016-07-05: 10 mg via INTRAVENOUS
  Filled 2016-07-05: qty 4

## 2016-07-05 MED ORDER — AMLODIPINE BESYLATE 5 MG PO TABS
5.0000 mg | ORAL_TABLET | Freq: Every day | ORAL | Status: DC
  Administered 2016-07-06: 5 mg via ORAL
  Filled 2016-07-05: qty 1

## 2016-07-05 MED ORDER — IRBESARTAN 150 MG PO TABS
150.0000 mg | ORAL_TABLET | Freq: Every day | ORAL | Status: DC
  Administered 2016-07-06: 150 mg via ORAL
  Filled 2016-07-05: qty 1

## 2016-07-05 NOTE — Progress Notes (Signed)
Alert and oriented x4. Eating breakfast. Talking on phone. Appetite good.  Denies pain. VSS. SBP 130's.  Lungs diminished with few fine crackles left upper lob auscultated.  On O2  2-3 L/[place] as she wears at home. Anuric. AV fistula left arm with dry dressing and good bruit and thrill. Await bed for transfer to med-surg.  Seen by Dr Juanell Fairly.

## 2016-07-05 NOTE — Progress Notes (Signed)
Post dialysis assesment

## 2016-07-05 NOTE — Progress Notes (Signed)
HD STARTED  

## 2016-07-05 NOTE — Progress Notes (Signed)
Hd comleted

## 2016-07-05 NOTE — Progress Notes (Signed)
PRE DIALYSIS ASSESSMENT 

## 2016-07-05 NOTE — Progress Notes (Signed)
Prepare to transfer to room 201. Dr Candiss Norse in to see now. Report called to RN for room 201.

## 2016-07-05 NOTE — Progress Notes (Signed)
Transferred at 1400 to room 201. Report updated post dialysis.

## 2016-07-05 NOTE — Progress Notes (Signed)
Will have dialysis here before transfer. Dialysis RN aware

## 2016-07-05 NOTE — Progress Notes (Signed)
Subjective:  Patient known to our practice from outpatient. She dialyzes at [facility] dialysis [employer] on TTS 1 shift She presents for acute onset of shortness of breath. Her last outpatient dialysis was Wednesday.   Urgent dialysis done on November 25 (Saturday) 4 L of fluid was removed Blood pressure control has improved significantly after volume removal Urgent is still requiring oxygen by nasal cannula  Objective:  Vital signs in last 24 hours:  Temp:  [97.7 F (36.5 C)-98.2 F (36.8 C)] 98.2 F (36.8 C) (11/26 0800) Pulse Rate:  [64-84] 72 (11/26 0900) Resp:  [10-26] 20 (11/26 0900) BP: (119-219)/(65-106) 130/72 (11/26 0900) SpO2:  [90 %-100 %] 99 % (11/26 0900) Weight:  [81.8 kg (180 lb 5.4 oz)-83.6 kg (184 lb 4.9 oz)] 83.6 kg (184 lb 4.9 oz) (11/26 0500)  Weight change: 3.553 kg (7 lb 13.3 oz) Filed Weights   07/04/16 0930 07/04/16 1330 07/05/16 0500  Weight: 85.2 kg (187 lb 13.3 oz) 81.8 kg (180 lb 5.4 oz) 83.6 kg (184 lb 4.9 oz)    Intake/Output:    Intake/Output Summary (Last 24 hours) at 07/05/16 1023 Last data filed at 07/04/16 1600  Gross per 24 hour  Intake           146.16 ml  Output             4000 ml  Net         -3853.84 ml     Physical Exam: General: Chronically ill-appearing, lying in the bed   HEENT Anicteric, moist oral mucous membranes   Neck Supple   Pulm/lungs Bi-basilar diffuse crackles , [place] O2  CVS/Heart Tachycardic, no rub or gallop   Abdomen:  Soft, nontender   Extremities: Trace edema bilaterally   Neurologic: Alert, able to follow commands   Skin: No acute rashes   Access: Right arm AV fistula        Basic Metabolic Panel:   Recent Labs Lab 07/04/16 0425 07/04/16 1311  NA 139 138  K 5.4* 3.6  CL 100* 96*  CO2 23 30  GLUCOSE 100* 92  BUN 82* 23*  CREATININE 13.06* 4.70*  CALCIUM 8.0* 8.7*  MG  --  2.0  PHOS  --  3.0     CBC:  Recent Labs Lab 07/04/16 0425  WBC 7.6  NEUTROABS 4.1  HGB 11.9*   HCT 35.8  MCV 89.5  PLT 264      Microbiology:  Recent Results (from the past 720 hour(s))  MRSA PCR Screening     Status: None   Collection Time: 07/04/16  6:15 AM  Result Value Ref Range Status   MRSA by PCR NEGATIVE NEGATIVE Final    Comment:        The GeneXpert MRSA Assay (FDA approved for NASAL specimens only), is one component of a comprehensive MRSA colonization surveillance program. It is not intended to diagnose MRSA infection nor to guide or monitor treatment for MRSA infections.     Coagulation Studies:  Recent Labs  07/04/16 0425  LABPROT 13.7  INR 1.05    Urinalysis: No results for input(s): COLORURINE, LABSPEC, PHURINE, GLUCOSEU, HGBUR, BILIRUBINUR, KETONESUR, PROTEINUR, UROBILINOGEN, NITRITE, LEUKOCYTESUR in the last 72 hours.  Invalid input(s): APPERANCEUR    Imaging: Dg Chest Portable 1 View  Result Date: 07/04/2016 CLINICAL DATA:  55 y/o F; severe shortness of breath. History of congestive heart failure on dialysis. EXAM: PORTABLE CHEST 1 VIEW COMPARISON:  06/10/2016 chest radiograph. FINDINGS: Interstitial markings and patchy  opacities of the lungs probably represents moderate pulmonary edema. Stable cardiomegaly. Possible small left effusion. No pneumothorax. Left axillary surgical clips. Bones are unremarkable. IMPRESSION: Moderate pulmonary edema. Stable cardiomegaly. Possible small left effusion. Electronically Signed   By: Kristine Garbe M.D.   On: 07/04/2016 04:43     Medications:    . amLODipine  5 mg Oral Daily  . aspirin  81 mg Oral Daily  . chlorhexidine  15 mL Mouth Rinse BID  . cinacalcet  30 mg Oral Q breakfast  . cloNIDine  0.1 mg Oral BID  . heparin  5,000 Units Subcutaneous Q8H  . insulin aspart  0-15 Units Subcutaneous Q4H  . ipratropium-albuterol  3 mL Nebulization Q6H  . irbesartan  150 mg Oral Daily  . LORazepam  0.5 mg Intravenous Q4H  . mouth rinse  15 mL Mouth Rinse q12n4p  . pantoprazole  40 mg  Oral Daily  . rosuvastatin  5 mg Oral QHS   sodium chloride, oxyCODONE **AND** acetaminophen, acetaminophen, metoCLOPramide (REGLAN) injection, ondansetron (ZOFRAN) IV  Assessment/ Plan:  55 y.o. African-American female with end-stage renal disease secondary to polycystic kidney disease, severe hypertension, GERD, peritoneal dialysis and history of peritonitis, arthritis, chronic left hand pain, history of ruptured AV access Presents for acute pulmonary edema and hypertensive emergency  TTS, [place]   1. End Stage Renal Disease: requiring emergent hemodialysis   - 4 kg removed - outpatient EDW 80 kg. - Today's weight 83.6 kg - will dialyze again today with UF goal 2 kg for volume optimization  2. Anemia of chronic kidney disease: hemoglobin 11.9 - epo with dialysis treatments once BP improves.   3. Acute pulmonary edema:  echo from 01/2016 shows Moderate conc LVH - Urgent HD done  4. Malignant HTN with Pulmonary edema and Acute resp failure -improved after volume removal - dose BP meds at night  5. Secondary Hyperparathyroidism: with hyperphosphatemia.  - calcium acetate with meals when able to take PO    LOS: 1 Jasmine Holloway 11/26/201710:23 AM

## 2016-07-05 NOTE — Progress Notes (Addendum)
PULMONARY / CRITICAL CARE MEDICINE   Name: Jasmine Holloway MRN: [id number] DOB: [birth year]    ADMISSION DATE:  07/04/2016  REFERRING MD:  ED  CHIEF COMPLAINT:  'I can't breath"  HISTORY OF PRESENT ILLNESS update:  This is a 55 year old African-American female with a past medical history of COPD, asthma, type 2 diabetes, end-stage renal disease on dialysis, diastolic heart failure, hypertension, CVA, diabetic neuropathy, and history of polycystic kidney disease who presents with acute respiratory distress. This patient is well-known to our service and was discharged on 06/10/2016 after hospitalization for acute on chronic hypoxic respiratory failure secondary to pulmonary edema. She presents today with similar symptoms. She states that she went for dialysis and could not tolerated dialysis due to severe muscle cramping have an abbreviated treatment was performed. Upon returning home, her respiratory status continued to worsen and it got to the point where she couldn't breathe, hence she called EMS. She is not anuric and takes Lasix at home. She reports taking all medications as prescribed. However, her blood pressure was severely elevated. Upon ED presentation. She is also complaining of nausea, vomiting and abdominal pain. PCCM was called to admit the patient for emergent dialysis and for blood pressure control. She is currently complaining of a headache which she attributes to the nitroglycerin infusion, but she is getting far. Blood pressure. Describes headache as generalized, 10 on 10 in intensity, no aggravating or relieving factors, pain does not radiate, and no photophobia. Associated symptoms include nausea and vomiting. She reports having similar symptoms with previous hypertensive crisis. She goes to dialysis Tuesday, Thursday and Saturday   SUBJECTIVE: No acute issues overnight. Off BiPAP. Respiratory status improved with HD. 4L of fluids removed with treatment. Denies chest pain, sob, nausea,  vomiting, diarrhea and headache  VITAL SIGNS: BP 123/77   Pulse 67   Temp 98.1 F (36.7 C) (Oral)   Resp 16   Ht 5\' 3"  (1.6 m)   Wt 184 lb 4.9 oz (83.6 kg)   SpO2 100%   BMI 32.65 kg/m   HEMODYNAMICS:    VENTILATOR SETTINGS: FiO2 (%):  [30 %] 30 %  INTAKE / OUTPUT: I/O last 3 completed shifts: In: 332.8 [I.V.:332.8] Out: 4000 [Other:4000]  PHYSICAL EXAMINATION: General: Chronically ill-looking, in no distress Neuro: Alert to person, place and time, speech is normal, moves all extremities, grip strength is normal in upper extremities and lower extremities, no focal deficits HEENT: PERRLA, nasal passages without discharge, oral mucosa pink, trachea midline. Cardiovascular: Rate and rhythm regular, S1, S2, no murmur, regurg or gallop,  moderate JVD Lungs: Moderate increase in work of breathing, Fine crackles in anterior and posterior lung fields, mild expiratory wheezes, no rhonchi. Abdomen: Nondistended, soft, nontender on palpation, normal bowel sounds   Musculoskeletal: No visible deformities, positive range of motion in upper and lower extremities Skin: Murmur and dry  LABS:  BMET  Recent Labs Lab 07/04/16 0425 07/04/16 1311  NA 139 138  K 5.4* 3.6  CL 100* 96*  CO2 23 30  BUN 82* 23*  CREATININE 13.06* 4.70*  GLUCOSE 100* 92    Electrolytes  Recent Labs Lab 07/04/16 0425 07/04/16 1311  CALCIUM 8.0* 8.7*  MG  --  2.0  PHOS  --  3.0    CBC  Recent Labs Lab 07/04/16 0425  WBC 7.6  HGB 11.9*  HCT 35.8  PLT 264    Coag's  Recent Labs Lab 07/04/16 0425  INR 1.05    Sepsis Markers No results for  input(s): LATICACIDVEN, PROCALCITON, O2SATVEN in the last 168 hours.  ABG No results for input(s): PHART, PCO2ART, PO2ART in the last 168 hours.  Liver Enzymes  Recent Labs Lab 07/04/16 0425  AST 22  ALT 15  ALKPHOS 77  BILITOT 0.7  ALBUMIN 4.0    Cardiac Enzymes  Recent Labs Lab 07/04/16 0847 07/04/16 1311 07/04/16 1930   TROPONINI 0.03* 0.03* 0.03*    Glucose  Recent Labs Lab 07/04/16 0959 07/04/16 1219 07/04/16 1614 07/04/16 1944 07/04/16 2330 07/05/16 0330  GLUCAP 94 92 92 139* 121* 113*    Imaging No results found.  STUDIES:  None  CULTURES: None  ANTIBIOTICS: None  SIGNIFICANT EVENTS: 06/10/2016: Discharge from the hospital following hospitalization for acute on chronic respiratory failure secondary to pulmonary edema. 07/04/2016: Readmitted with acute respiratory failure necessitating BiPAP, pulmonary edema, and hypertensive crisis  LINES/TUBES: Peripheral IVs  DISCUSSION: This is a 55 year old African-American female with end-stage renal disease, non-anuric, presenting with recurrent pulmonary edema, acute on chronic respiratory failure secondary to pulmonary edema, and hypertensive crisis secondary to nonadherence  ASSESSMENT / PLAN:  PULMONARY A: Acute on chronic respiratory failure secondary to pulmonary edema Acute pulmonary edema secondary to volume overload and missed dialysis P:   Lasix 80 mg IV given in the ED BiPAP with current settings, titrate to keep SPO2 greater than 90%. Hemodialysis per nephrology Portable chest x-ray in the morning  CARDIOVASCULAR A:  Hypertensive crisis secondary to nonadherence and volume overload P:  Hemodynamic monitoring per ICU protocol IV diuresis Resume all home blood pressure medications. Nitroglycerin infusion.  RENAL A:   End-stage renal disease on dialysis P:   Nephrology following. Hemodialysis per nephrology Monitor and correct electrolytes  GASTROINTESTINAL A:   Nausea and vomiting Abdominal pain, likely secondary to diabetic gastroparesis P:   Zofran and Reglan as needed. Oral intake as tolerated  HEMATOLOGIC A:   Mild anemia secondary to chronic disease P:  Monitor hemoglobin and hematocrit Transfuse if hemoglobin less than 7  INFECTIOUS A:   No acute issues P:   Monitor WBC and fever  curve  ENDOCRINE A:   Type 2 DM P:   Blood glucose monitoring with SSI coverage  NEUROLOGIC A:   Headache secondary to severely elevated blood pressure and side effects of nitroglycerin infusion P:   RASS goal: Not applicable. Monitor neurologic status. Tylenol as needed for headache. Optimize blood pressure control with oral antihypertensives and nitroglycerin infusion   FAMILY  - Updates: Patient updated on current treatment plan. Stable for transfer out of the unit. Plan of care discussed with Dr. Ashby Dawes. Patient will need care coordination with primary care provider and outpatient dialysis center prior to discharge to avoid another readmission. Social service consult requested  - Inter-disciplinary family meet or Palliative Care meeting due by:  day [place]. [facility] ANP-BC Pulmonary and Critical Care Medicine [facility] Pager [phone] or [phone] 07/05/2016, 6:55 AM  Patient seen and examined with NP, agree with assessment, plan, findings as amended. The patient is admitted with acute respiratory failure/respiratory distress with pulmonary edema secondary to volume overload. She is significantly better today since being dialyzed with removal of approximately 4 L of fluid. She is being transferred to the floor, for further monitoring and may require further sessions of dialysis. Transfer report called to hospitalist service, Dr. Serita Grit.   -Marda Stalker, M.D.  07/05/2016

## 2016-07-05 NOTE — Clinical Social Work Note (Signed)
CSW received consult for nonadhering patient education for ESRD diet and for medication assistance. Patient currently has order for dietician consult (which is appropriate for the patient education). Medication assistance is the purview of RNCM, who is aware and already following according to the chart note from 07/04/2016. Please re-consult CSW as needed; CSW signing off.  Santiago Bumpers, MSW, LCSW-A [phone]

## 2016-07-06 ENCOUNTER — Encounter: Payer: Self-pay | Admitting: *Deleted

## 2016-07-06 LAB — CBC
HEMATOCRIT: 33.1 % — AB (ref 35.0–47.0)
HEMOGLOBIN: 10.8 g/dL — AB (ref 12.0–16.0)
MCH: 29.2 pg (ref 26.0–34.0)
MCHC: 32.6 g/dL (ref 32.0–36.0)
MCV: 89.6 fL (ref 80.0–100.0)
Platelets: 144 10*3/uL — ABNORMAL LOW (ref 150–440)
RBC: 3.69 MIL/uL — AB (ref 3.80–5.20)
RDW: 22.5 % — AB (ref 11.5–14.5)
WBC: 5.7 10*3/uL (ref 3.6–11.0)

## 2016-07-06 LAB — GLUCOSE, CAPILLARY
GLUCOSE-CAPILLARY: 111 mg/dL — AB (ref 65–99)
GLUCOSE-CAPILLARY: 112 mg/dL — AB (ref 65–99)
GLUCOSE-CAPILLARY: 87 mg/dL (ref 65–99)
GLUCOSE-CAPILLARY: 89 mg/dL (ref 65–99)
GLUCOSE-CAPILLARY: 94 mg/dL (ref 65–99)
Glucose-Capillary: 104 mg/dL — ABNORMAL HIGH (ref 65–99)
Glucose-Capillary: 89 mg/dL (ref 65–99)

## 2016-07-06 LAB — BASIC METABOLIC PANEL
ANION GAP: 11 (ref 5–15)
BUN: 62 mg/dL — ABNORMAL HIGH (ref 6–20)
CALCIUM: 7.6 mg/dL — AB (ref 8.9–10.3)
CHLORIDE: 99 mmol/L — AB (ref 101–111)
CO2: 27 mmol/L (ref 22–32)
Creatinine, Ser: 9.79 mg/dL — ABNORMAL HIGH (ref 0.44–1.00)
GFR calc non Af Amer: 4 mL/min — ABNORMAL LOW (ref >60)
GFR, EST AFRICAN AMERICAN: 5 mL/min — AB (ref >60)
GLUCOSE: 88 mg/dL (ref 65–99)
POTASSIUM: 5.5 mmol/L — AB (ref 3.5–5.1)
Sodium: 137 mmol/L (ref 135–145)

## 2016-07-06 MED ORDER — CALCIUM ACETATE (PHOS BINDER) 667 MG PO CAPS
2001.0000 mg | ORAL_CAPSULE | Freq: Three times a day (TID) | ORAL | Status: DC
  Administered 2016-07-06 – 2016-07-07 (×3): 2001 mg via ORAL
  Filled 2016-07-06 (×3): qty 3

## 2016-07-06 NOTE — Progress Notes (Signed)
Pt alert and sitting up. Pt is connected to local church and has family nearby for support. CH offered prayer.   07/06/16 1050  Clinical Encounter Type  Visited With Patient  Visit Type Initial  Referral From Nurse  Spiritual Encounters  Spiritual Needs Prayer;Emotional  Stress Factors  Patient Stress Factors None identified

## 2016-07-06 NOTE — Progress Notes (Signed)
Subjective:  Patient feels better after undergoing hemodialysis yesterday. Patient achieved was 4 kg. Breathing comfortably at the moment.   Objective:  Vital signs in last 24 hours:  Temp:  [97.7 F (36.5 C)-98 F (36.7 C)] 98 F (36.7 C) (11/27 0423) Pulse Rate:  [61-70] 62 (11/27 1039) Resp:  [18-23] 19 (11/27 0423) BP: (139-201)/(71-94) 147/82 (11/27 1039) SpO2:  [98 %-100 %] 100 % (11/27 0746) Weight:  [81 kg (178 lb 9.6 oz)] 81 kg (178 lb 9.6 oz) (11/27 0555)  Weight change: 0.4 kg (14.1 oz) Filed Weights   07/05/16 0500 07/05/16 1100 07/06/16 0555  Weight: 83.6 kg (184 lb 4.9 oz) 85.6 kg (188 lb 11.4 oz) 81 kg (178 lb 9.6 oz)    Intake/Output:    Intake/Output Summary (Last 24 hours) at 07/06/16 1101 Last data filed at 07/06/16 1002  Gross per 24 hour  Intake              360 ml  Output              288 ml  Net               72 ml     Physical Exam: General: Chronically ill-appearing, lying in the bed   HEENT Anicteric, moist oral mucous membranes   Neck Supple   Pulm/lungs Mild basilar rales, normal effort  CVS/Heart S1S2 no rubs  Abdomen:  Soft, nontender   Extremities: Trace edema bilaterally   Neurologic: Alert, able to follow commands   Skin: No acute rashes   Access: Right arm AV fistula        Basic Metabolic Panel:   Recent Labs Lab 07/04/16 0425 07/04/16 1311 07/06/16 0458  NA 139 138 137  K 5.4* 3.6 5.5*  CL 100* 96* 99*  CO2 23 30 27   GLUCOSE 100* 92 88  BUN 82* 23* 62*  CREATININE 13.06* 4.70* 9.79*  CALCIUM 8.0* 8.7* 7.6*  MG  --  2.0  --   PHOS  --  3.0  --      CBC:  Recent Labs Lab 07/04/16 0425 07/06/16 0458  WBC 7.6 5.7  NEUTROABS 4.1  --   HGB 11.9* 10.8*  HCT 35.8 33.1*  MCV 89.5 89.6  PLT 264 144*      Microbiology:  Recent Results (from the past 720 hour(s))  MRSA PCR Screening     Status: None   Collection Time: 07/04/16  6:15 AM  Result Value Ref Range Status   MRSA by PCR NEGATIVE NEGATIVE  Final    Comment:        The GeneXpert MRSA Assay (FDA approved for NASAL specimens only), is one component of a comprehensive MRSA colonization surveillance program. It is not intended to diagnose MRSA infection nor to guide or monitor treatment for MRSA infections.     Coagulation Studies:  Recent Labs  07/04/16 0425  LABPROT 13.7  INR 1.05    Urinalysis: No results for input(s): COLORURINE, LABSPEC, PHURINE, GLUCOSEU, HGBUR, BILIRUBINUR, KETONESUR, PROTEINUR, UROBILINOGEN, NITRITE, LEUKOCYTESUR in the last 72 hours.  Invalid input(s): APPERANCEUR    Imaging: No results found.   Medications:    . amLODipine  5 mg Oral QHS  . aspirin  81 mg Oral Daily  . cinacalcet  30 mg Oral Q breakfast  . cloNIDine  0.1 mg Oral BID  . heparin  5,000 Units Subcutaneous Q8H  . insulin aspart  0-15 Units Subcutaneous Q4H  . ipratropium-albuterol  3 mL  Nebulization Q6H  . irbesartan  150 mg Oral QHS  . LORazepam  0.5 mg Intravenous Q4H  . mouth rinse  15 mL Mouth Rinse q12n4p  . pantoprazole  40 mg Oral Daily  . rosuvastatin  5 mg Oral QHS   sodium chloride, oxyCODONE **AND** acetaminophen, acetaminophen, metoCLOPramide (REGLAN) injection, ondansetron (ZOFRAN) IV  Assessment/ Plan:  55 y.o. African-American female with end-stage renal disease secondary to polycystic kidney disease, severe hypertension, GERD, peritoneal dialysis and history of peritonitis, arthritis, chronic left hand pain, history of ruptured AV access Presents for acute pulmonary edema and hypertensive emergency  TTS, [place]   1. End Stage Renal Disease: requiring emergent hemodialysis   - Patient doing better after dialysis yesterday. No urgent indication for dialysis today but we will plan for hemodialysis again tomorrow.  2. Anemia of chronic kidney disease: Hemoglobin currently 10.8 and at target.  3. Acute pulmonary edema:  echo from 01/2016 shows Moderate conc LVH - Clinically  appears to be improved. No urgent indication for dialysis today. We will plan for dialysis again if still here tomorrow.  4. Malignant HTN with Pulmonary edema and Acute resp failure -As above significant improvement noted post dialysis yesterday. 4 kg of fluid was removed.  5. Secondary Hyperparathyroidism: with hyperphosphatemia.  - Resume PhosLo 3 tablets by mouth 3 times a day with meals.    LOS: 2 Meredeth Furber 11/27/201711:01 AM

## 2016-07-06 NOTE — Care Management (Signed)
HD information faxed to [facility] .

## 2016-07-06 NOTE — Progress Notes (Addendum)
Jasmine Holloway NAME: Jasmine Holloway    MR#:  [id number]  DATE OF BIRTH:  03-31-1961  SUBJECTIVE:   Came in with resp distress REVIEW OF SYSTEMS:   Review of Systems  Constitutional: Negative for chills, fever and weight loss.  HENT: Negative for ear discharge, ear pain and nosebleeds.   Eyes: Negative for blurred vision, pain and discharge.  Respiratory: Positive for shortness of breath. Negative for sputum production, wheezing and stridor.   Cardiovascular: Negative for chest pain, palpitations, orthopnea and PND.  Gastrointestinal: Negative for abdominal pain, diarrhea, nausea and vomiting.  Genitourinary: Negative for frequency and urgency.  Musculoskeletal: Negative for back pain and joint pain.  Neurological: Positive for weakness. Negative for sensory change, speech change and focal weakness.  Psychiatric/Behavioral: Negative for depression and hallucinations. The patient is not nervous/anxious.    Tolerating Diet:yes Tolerating PT: not needed  DRUG ALLERGIES:   Allergies  Allergen Reactions  . Morphine And Related Nausea And Vomiting  . Vicodin [Hydrocodone-Acetaminophen] Nausea And Vomiting  . Buprenorphine Hcl Nausea And Vomiting  . Codeine Nausea And Vomiting    VITALS:  Blood pressure (!) 163/83, pulse 66, temperature 98.3 F (36.8 C), temperature source Oral, resp. rate 17, height 5\' 3"  (1.6 m), weight 81 kg (178 lb 9.6 oz), SpO2 99 %.  PHYSICAL EXAMINATION:   Physical Exam  GENERAL:  55 y.o.-year-old patient lying in the bed with no acute distress.  EYES: Pupils equal, round, reactive to light and accommodation. No scleral icterus. Extraocular muscles intact.  HEENT: Head atraumatic, normocephalic. Oropharynx and nasopharynx clear.  NECK:  Supple, no jugular venous distention. No thyroid enlargement, no tenderness.  LUNGS: Normal breath sounds bilaterally, no wheezing, rales, rhonchi. No use of accessory  muscles of respiration.  CARDIOVASCULAR: S1, S2 normal. No murmurs, rubs, or gallops.  ABDOMEN: Soft, nontender, nondistended. Bowel sounds present. No organomegaly or mass.  EXTREMITIES: No cyanosis, clubbing or edema b/l.    NEUROLOGIC: Cranial nerves II through XII are intact. No focal Motor or sensory deficits b/l.   PSYCHIATRIC:  patient is alert and oriented x 3.  SKIN: No obvious rash, lesion, or ulcer.   LABORATORY PANEL:  CBC  Recent Labs Lab 07/06/16 0458  WBC 5.7  HGB 10.8*  HCT 33.1*  PLT 144*    Chemistries   Recent Labs Lab 07/04/16 0425 07/04/16 1311 07/06/16 0458  NA 139 138 137  K 5.4* 3.6 5.5*  CL 100* 96* 99*  CO2 23 30 27   GLUCOSE 100* 92 88  BUN 82* 23* 62*  CREATININE 13.06* 4.70* 9.79*  CALCIUM 8.0* 8.7* 7.6*  MG  --  2.0  --   AST 22  --   --   ALT 15  --   --   ALKPHOS 77  --   --   BILITOT 0.7  --   --    Cardiac Enzymes  Recent Labs Lab 07/04/16 1930  TROPONINI 0.03*   RADIOLOGY:  No results found. ASSESSMENT AND PLAN:   55 year old African-American female with a past medical history of COPD, asthma, type 2 diabetes, end-stage renal disease on dialysis, diastolic heart failure, hypertension, CVA, diabetic neuropathy, and history of polycystic kidney disease who presents with acute respiratory distress. This patient is well-known to our service and was discharged on 06/10/2016 after hospitalization for acute on chronic hypoxic respiratory failure secondary to pulmonary edema  * Acute on chornic respiratory  failure with fluid overload  secondary to Ac on ch diastolic heart failure. Pt was emergently dialyzed and 4 liters UF done -improving overall -frequent admissions with similar issues.  * Hypertension COnt home meds   * DM Keep on ISS.  * COPD No active wheezing, cont inhalers.  Case discussed with Care Management/Social Worker. Management plans discussed with the patient, family and they are in  agreement.  CODE STATUS: full  DVT Prophylaxis: heparin  TOTAL TIME TAKING CARE OF THIS PATIENT: 40 minutes.  >50% time spent on counselling and coordination of care  POSSIBLE D/C IN 1-2 DAYS, DEPENDING ON CLINICAL CONDITION.  Note: This dictation was prepared with Dragon dictation along with smaller phrase technology. Any transcriptional errors that result from this process are unintentional.  Jasmine Holloway M.D on 07/06/2016 at 5:55 PM  Between 7am to 6pm - Pager - [phone]  After 6pm go to [email or web address] - password [email or web address] [place] [facility]  Office  [phone]  CC: Primary care physician; Jasmine Jarvis, MD

## 2016-07-06 NOTE — Plan of Care (Signed)
Problem: Food- and Nutrition-Related Knowledge Deficit (NB-1.1) Goal: Nutrition education Formal process to instruct or train a patient/client in a skill or to impart knowledge to help patients/clients voluntarily manage or modify food choices and eating behavior to maintain or improve health. Outcome: Completed/Met Date Met: 07/06/16 Nutrition Education Note  RD consulted for Renal Education. Provided Choose-A-Meal Booklet to patient/family. Reviewed food groups and provided written recommended serving sizes specifically determined for patient's current nutritional status.   Explained why diet restrictions are needed and provided lists of foods to limit/avoid that are high potassium, sodium, and phosphorus. Provided specific recommendations on safer alternatives of these foods. Strongly encouraged compliance of this diet.   Discussed importance of protein intake at each meal and snack. Provided examples of how to maximize protein intake throughout the day. Discussed need for fluid restriction with dialysis, importance of minimizing weight gain between HD treatments, and renal-friendly beverage options.  Encouraged pt to discuss specific diet questions/concerns with RD at HD outpatient facility. Teach back method used.  Expect fair compliance.  Body mass index is 31.64 kg/m. Pt meets criteria for obese class I based on current BMI.  Current diet order is renal/carb, patient is consuming approximately 100% of meals at this time. Labs and medications reviewed. No further nutrition interventions warranted at this time. RD contact information provided. If additional nutrition issues arise, please re-consult RD.  [name]. Allyanna Appleman, MS, RD LDN Inpatient Clinical Dietitian Pager [phone]

## 2016-07-07 LAB — GLUCOSE, CAPILLARY
GLUCOSE-CAPILLARY: 101 mg/dL — AB (ref 65–99)
Glucose-Capillary: 116 mg/dL — ABNORMAL HIGH (ref 65–99)
Glucose-Capillary: 92 mg/dL (ref 65–99)
Glucose-Capillary: 87 mg/dL (ref 65–99)
Glucose-Capillary: 81 mg/dL (ref 65–99)

## 2016-07-07 LAB — PHOSPHORUS: Phosphorus: 8.4 mg/dL — ABNORMAL HIGH (ref 2.5–4.6)

## 2016-07-07 MED ORDER — CALCIUM ACETATE (PHOS BINDER) 667 MG PO CAPS: 2001.0000 mg | ORAL_CAPSULE | Freq: Three times a day (TID) | ORAL | 1 refills | Status: DC

## 2016-07-07 MED ORDER — SENSIPAR 30 MG PO TABS: 30.0000 mg | ORAL_TABLET | Freq: Every day | ORAL | 1 refills | Status: DC

## 2016-07-07 MED ORDER — ROSUVASTATIN CALCIUM 5 MG PO TABS: 5.0000 mg | ORAL_TABLET | Freq: Every day | ORAL | 0 refills | Status: DC

## 2016-07-07 MED ORDER — FUROSEMIDE 80 MG PO TABS
80.0000 mg | ORAL_TABLET | Freq: Every day | ORAL | Status: DC
  Administered 2016-07-07: 80 mg via ORAL
  Filled 2016-07-07: qty 1

## 2016-07-07 MED ORDER — METOPROLOL SUCCINATE ER 50 MG PO TB24
50.0000 mg | ORAL_TABLET | Freq: Every day | ORAL | Status: DC
  Administered 2016-07-07: 50 mg via ORAL
  Filled 2016-07-07: qty 1

## 2016-07-07 NOTE — Progress Notes (Signed)
Post hd assessment 

## 2016-07-07 NOTE — Care Management Important Message (Signed)
Important Message  Patient Details  Name: Jasmine Holloway MRN: [id number] Date of Birth: 01-Jul-1961   Medicare Important Message Given:  Yes    Beverly Sessions, RN 07/07/2016, 1:33 PM

## 2016-07-07 NOTE — Discharge Summary (Signed)
[facility] [place] [facility] NAME: Jasmine Holloway    MR#:  [id number]  DATE OF BIRTH:  [birth year]  DATE OF ADMISSION:  07/04/2016 ADMITTING PHYSICIAN: Fritzi Mandes, MD  DATE OF DISCHARGE: 06/27/16  PRIMARY CARE PHYSICIAN: Elyse Jarvis, MD    ADMISSION DIAGNOSIS:  Acute pulmonary edema (HCC) [J81.0] ESRD (end stage renal disease) on dialysis ([place]) [N18.6, M46.8] Acute systolic congestive heart failure ([place]) [I50.21] Hypertensive emergency [I16.1] Acute respiratory failure, unspecified whether with hypoxia or hypercapnia ([place]) [J96.00]  DISCHARGE DIAGNOSIS:  Acute pulmonary edema/fluid overload Acute pulmonary edema (HCC) [J81.0] ESRD (end stage renal disease) on dialysis ([place]) [N18.6, E32.1] Acute systolic congestive heart failure ([place]) [I50.21] Hypertensive emergency [I16.1] SECONDARY DIAGNOSIS:   Past Medical History:  Diagnosis Date  . Asthma   . COPD (chronic obstructive pulmonary disease) ([place])   . Diabetes mellitus without complication ([place])   . Diastolic heart failure ([place])   . ESRD (end stage renal disease) on dialysis ([place])   . Hypertension   . Neuropathy ([place])   . Polycystic kidney disease   . Renal insufficiency     HOSPITAL COURSE:   55 year old African-American female with a past medical history of COPD, asthma, type 2 diabetes, end-stage renal disease on dialysis, diastolic heart failure, hypertension, CVA, diabetic neuropathy, and history of polycystic kidney disease who presents with acute respiratory distress. This patient is well-known to our service and was discharged on 06/10/2016 after hospitalization for acute on chronic hypoxic respiratory failure secondary to pulmonary edema  * Acute on chornic respiratory  failure with fluid overload secondary to Ac on ch diastolic heart failure. Pt was emergently dialyzed and 4 liters UF done -improving overall -frequent admissions with similar issues. -HD done today  also. UF 1.1 liter Appears at baseline. Uses home oxygen-chronically  * Hypertension COnt home meds   * DM Keep on ISS.  * COPD No active wheezing, cont inhalers.  Overall improved D/c home CONSULTS OBTAINED:  Treatment Team:  Murlean Iba, MD  DRUG ALLERGIES:   Allergies  Allergen Reactions  . Morphine And Related Nausea And Vomiting  . Vicodin [Hydrocodone-Acetaminophen] Nausea And Vomiting  . Buprenorphine Hcl Nausea And Vomiting  . Codeine Nausea And Vomiting    DISCHARGE MEDICATIONS:   Current Discharge Medication List    START taking these medications   Details  calcium acetate (PHOSLO) 667 MG capsule Take 3 capsules (2,001 mg total) by mouth 3 (three) times daily with meals. Qty: 90 capsule, Refills: 1      CONTINUE these medications which have CHANGED   Details  rosuvastatin (CRESTOR) 5 MG tablet Take 1 tablet (5 mg total) by mouth at bedtime. Qty: 30 tablet, Refills: 0    SENSIPAR 30 MG tablet Take 1 tablet (30 mg total) by mouth daily. Qty: 60 tablet, Refills: 1      CONTINUE these medications which have NOT CHANGED   Details  acetaminophen (TYLENOL) 325 MG tablet Take 2 tablets (650 mg total) by mouth every 6 (six) hours as needed for mild pain or headache.    amLODipine (NORVASC) 5 MG tablet Take 5 mg by mouth daily.     aspirin 81 MG chewable tablet Chew 1 tablet (81 mg total) by mouth daily. Qty: 30 tablet, Refills: 2    b complex-vitamin c-folic acid (NEPHRO-VITE) 0.8 MG TABS tablet Take 1 tablet by mouth at bedtime.    cloNIDine (CATAPRES) 0.1 MG tablet Take 1 tablet (0.1 mg total) by mouth  2 (two) times daily. Qty: 60 tablet, Refills: 2    furosemide (LASIX) 80 MG tablet Take 1 tablet (80 mg total) by mouth daily. Qty: 90 tablet, Refills: 3    gabapentin (NEURONTIN) 300 MG capsule Take 300 mg by mouth 2 (two) times daily.     irbesartan (AVAPRO) 150 MG tablet Take 1 tablet by mouth daily.    metolazone (ZAROXOLYN) 5 MG  tablet Take 1 tablet (5 mg total) by mouth daily as needed. Qty: 30 tablet, Refills: 2    metoprolol succinate (TOPROL-XL) 50 MG 24 hr tablet Take 1 tablet by mouth daily.    omeprazole (PRILOSEC) 20 MG capsule Take 20 mg by mouth 2 (two) times daily.         If you experience worsening of your admission symptoms, develop shortness of breath, life threatening emergency, suicidal or homicidal thoughts you must seek medical attention immediately by calling 911 or calling your MD immediately  if symptoms less severe.  You Must read complete instructions/literature along with all the possible adverse reactions/side effects for all the Medicines you take and that have been prescribed to you. Take any new Medicines after you have completely understood and accept all the possible adverse reactions/side effects.   Please note  You were cared for by a hospitalist during your hospital stay. If you have any questions about your discharge medications or the care you received while you were in the hospital after you are discharged, you can call the unit and asked to speak with the hospitalist on call if the hospitalist that took care of you is not available. Once you are discharged, your primary care physician will handle any further medical issues. Please note that NO REFILLS for any discharge medications will be authorized once you are discharged, as it is imperative that you return to your primary care physician (or establish a relationship with a primary care physician if you do not have one) for your aftercare needs so that they can reassess your need for medications and monitor your lab values. Today   SUBJECTIVE   Doing ok. No new complaints. Just got back form HD  VITAL SIGNS:  Blood pressure (!) 168/84, pulse 78, temperature 98.4 F (36.9 C), temperature source Oral, resp. rate 15, height 5\' 3"  (1.6 m), weight 82 kg (180 lb 12.4 oz), SpO2 99 %.  I/O:   Intake/Output Summary (Last 24 hours)  at 07/07/16 1414 Last data filed at 07/07/16 1240  Gross per 24 hour  Intake              680 ml  Output             1209 ml  Net             -529 ml    PHYSICAL EXAMINATION:  GENERAL:  55 y.o.-year-old patient lying in the bed with no acute distress.  EYES: Pupils equal, round, reactive to light and accommodation. No scleral icterus. Extraocular muscles intact.  HEENT: Head atraumatic, normocephalic. Oropharynx and nasopharynx clear.  NECK:  Supple, no jugular venous distention. No thyroid enlargement, no tenderness.  LUNGS: Normal breath sounds bilaterally, no wheezing, rales,rhonchi or crepitation. No use of accessory muscles of respiration.  CARDIOVASCULAR: S1, S2 normal. No murmurs, rubs, or gallops.  ABDOMEN: Soft, non-tender, non-distended. Bowel sounds present. No organomegaly or mass.  EXTREMITIES: No pedal edema, cyanosis, or clubbing.  NEUROLOGIC: Cranial nerves II through XII are intact. Muscle strength 5/5 in all extremities. Sensation intact. Gait  not checked.  PSYCHIATRIC: The patient is alert and oriented x 3.  SKIN: No obvious rash, lesion, or ulcer.   DATA REVIEW:   CBC   Recent Labs Lab 07/06/16 0458  WBC 5.7  HGB 10.8*  HCT 33.1*  PLT 144*    Chemistries   Recent Labs Lab 07/04/16 0425 07/04/16 1311 07/06/16 0458  NA 139 138 137  K 5.4* 3.6 5.5*  CL 100* 96* 99*  CO2 23 30 27   GLUCOSE 100* 92 88  BUN 82* 23* 62*  CREATININE 13.06* 4.70* 9.79*  CALCIUM 8.0* 8.7* 7.6*  MG  --  2.0  --   AST 22  --   --   ALT 15  --   --   ALKPHOS 77  --   --   BILITOT 0.7  --   --     Microbiology Results   Recent Results (from the past 240 hour(s))  MRSA PCR Screening     Status: None   Collection Time: 07/04/16  6:15 AM  Result Value Ref Range Status   MRSA by PCR NEGATIVE NEGATIVE Final    Comment:        The GeneXpert MRSA Assay (FDA approved for NASAL specimens only), is one component of a comprehensive MRSA colonization surveillance program.  It is not intended to diagnose MRSA infection nor to guide or monitor treatment for MRSA infections.     RADIOLOGY:  No results found.   Management plans discussed with the patient, family and they are in agreement.  CODE STATUS:     Code Status Orders        Start     Ordered   07/04/16 0506  Full code  Continuous     07/04/16 0508    Code Status History    Date Active Date Inactive Code Status Order ID Comments User Context   06/09/2016  4:58 AM 06/10/2016  7:32 PM Full Code [id number]  Awilda Bill, NP ED   05/11/2016 10:24 AM 05/12/2016  6:26 PM Full Code [id number]  Vaughan Basta, MD Inpatient   04/28/2016  8:30 PM 04/30/2016  5:49 PM Full Code [id number]  Gladstone Lighter, MD Inpatient   03/28/2016  6:25 PM 03/29/2016  5:26 PM Full Code [id number]  Vaughan Basta, MD ED   03/17/2016  6:06 AM 03/18/2016  7:51 AM Full Code [id number]  Saundra Shelling, MD ED   01/10/2016  9:11 PM 01/17/2016  9:36 PM Full Code [id number]  Mikael Spray, NP ED   12/21/2015  6:08 AM 12/22/2015  4:09 PM Full Code [id number]  Saundra Shelling, MD Inpatient   02/24/2015  3:52 AM 02/24/2015  4:15 PM Full Code [id number]  Harrie Foreman, MD Inpatient      TOTAL TIME TAKING CARE OF THIS PATIENT: 40 minutes.    Taegen Lennox M.D on 07/07/2016 at 2:14 PM  Between 7am to 6pm - Pager - [phone] After 6pm go to [email or web address] - password [email or web address] [facility]  Office  [phone]  CC: Primary care physician; Elyse Jarvis, MD

## 2016-07-07 NOTE — Progress Notes (Signed)
Pre hd info 

## 2016-07-07 NOTE — Discharge Instructions (Signed)
Resume your HD schedule as before

## 2016-07-07 NOTE — Progress Notes (Signed)
Pt ended HD tx AMA one hour early. C/o of nausea, shaking and headache at temples. Vss and CBG 87. Post HD pt has no further c/o and vss. Report to J. Simser, primary RN.

## 2016-07-07 NOTE — Progress Notes (Signed)
Pre hd assessment  

## 2016-07-07 NOTE — Progress Notes (Signed)
Post hd vitals 

## 2016-07-07 NOTE — Progress Notes (Signed)
Subjective:  Patient completed hemodialysis today. Ultrafiltration she was 1.1 kg. Blood sugar was low normal during dialysis and she was given a graham cracker.   Objective:  Vital signs in last 24 hours:  Temp:  [98.1 F (36.7 C)-98.5 F (36.9 C)] 98.4 F (36.9 C) (11/28 1344) Pulse Rate:  [62-78] 78 (11/28 1250) Resp:  [13-24] 15 (11/28 1344) BP: (145-179)/(79-97) 168/84 (11/28 1344) SpO2:  [90 %-100 %] 99 % (11/28 1344) FiO2 (%):  [28 %] 28 % (11/28 1434) Weight:  [82 kg (180 lb 12.4 oz)-82.5 kg (181 lb 14.1 oz)] 82 kg (180 lb 12.4 oz) (11/28 1240)  Weight change: -3.499 kg (-7 lb 11.4 oz) Filed Weights   07/07/16 0445 07/07/16 1003 07/07/16 1240  Weight: 82.1 kg (181 lb) 82.5 kg (181 lb 14.1 oz) 82 kg (180 lb 12.4 oz)    Intake/Output:    Intake/Output Summary (Last 24 hours) at 07/07/16 1526 Last data filed at 07/07/16 1240  Gross per 24 hour  Intake              440 ml  Output             1209 ml  Net             -769 ml     Physical Exam: General: Chronically ill-appearing, lying in the bed   HEENT Anicteric, moist oral mucous membranes   Neck Supple   Pulm/lungs Mild basilar rales, normal effort  CVS/Heart S1S2 no rubs  Abdomen:  Soft, nontender   Extremities: Trace edema bilaterally   Neurologic: Alert, able to follow commands   Skin: No acute rashes   Access: Right arm AV fistula        Basic Metabolic Panel:   Recent Labs Lab 07/04/16 0425 07/04/16 1311 07/06/16 0458 07/07/16 1010  NA 139 138 137  --   K 5.4* 3.6 5.5*  --   CL 100* 96* 99*  --   CO2 23 30 27   --   GLUCOSE 100* 92 88  --   BUN 82* 23* 62*  --   CREATININE 13.06* 4.70* 9.79*  --   CALCIUM 8.0* 8.7* 7.6*  --   MG  --  2.0  --   --   PHOS  --  3.0  --  8.4*     CBC:  Recent Labs Lab 07/04/16 0425 07/06/16 0458  WBC 7.6 5.7  NEUTROABS 4.1  --   HGB 11.9* 10.8*  HCT 35.8 33.1*  MCV 89.5 89.6  PLT 264 144*      Microbiology:  Recent Results (from the  past 720 hour(s))  MRSA PCR Screening     Status: None   Collection Time: 07/04/16  6:15 AM  Result Value Ref Range Status   MRSA by PCR NEGATIVE NEGATIVE Final    Comment:        The GeneXpert MRSA Assay (FDA approved for NASAL specimens only), is one component of a comprehensive MRSA colonization surveillance program. It is not intended to diagnose MRSA infection nor to guide or monitor treatment for MRSA infections.     Coagulation Studies: No results for input(s): LABPROT, INR in the last 72 hours.  Urinalysis: No results for input(s): COLORURINE, LABSPEC, PHURINE, GLUCOSEU, HGBUR, BILIRUBINUR, KETONESUR, PROTEINUR, UROBILINOGEN, NITRITE, LEUKOCYTESUR in the last 72 hours.  Invalid input(s): APPERANCEUR    Imaging: No results found.   Medications:    . amLODipine  5 mg Oral QHS  . aspirin  81 mg Oral Daily  . calcium acetate  2,001 mg Oral TID WC  . cinacalcet  30 mg Oral Q breakfast  . cloNIDine  0.1 mg Oral BID  . furosemide  80 mg Oral Daily  . heparin  5,000 Units Subcutaneous Q8H  . insulin aspart  0-15 Units Subcutaneous Q4H  . ipratropium-albuterol  3 mL Nebulization Q6H  . irbesartan  150 mg Oral QHS  . metoprolol succinate  50 mg Oral Daily  . pantoprazole  40 mg Oral Daily  . rosuvastatin  5 mg Oral QHS   sodium chloride, oxyCODONE **AND** acetaminophen, acetaminophen, metoCLOPramide (REGLAN) injection, ondansetron (ZOFRAN) IV  Assessment/ Plan:  55 y.o. African-American female with end-stage renal disease secondary to polycystic kidney disease, severe hypertension, GERD, peritoneal dialysis and history of peritonitis, arthritis, chronic left hand pain, history of ruptured AV access Presents for acute pulmonary edema and hypertensive emergency  TTS, [place]   1. End Stage Renal Disease: requiring emergent hemodialysis upon admission. - patient completed hemodialysis today.  She tolerated this well.  We will plan for dialysis  again on Thursday as an outpatient.  2. Anemia of chronic kidney disease: continue to monitor her CBC as an outpatient.  3. Acute pulmonary edema:  echo from 01/2016 shows Moderate conc LVH - patient has had multiple consecutive dialysis treatments which have improved her pulmonary edema.  We will continue to monitor this as an outpatient.  4. Malignant HTN with Pulmonary edema and Acute resp failure -blood pressure continues to be labile at times.  Most recent blood pressure was 168/84 however blood pressure has been as low as 145/79 within the past 24 hours.  5. Secondary Hyperparathyroidism: with hyperphosphatemia.  - last phosphorus was quite high at 8.4.  Continue PhosLo 3 tablets by mouth 3 times a day with meals as an outpatient.    LOS: 3 Xxavier Noon 11/28/20173:26 PM

## 2016-07-07 NOTE — Progress Notes (Addendum)
Pt to be discharged per MD order. Iv removed. Instructions reviewed with pt and family. Pt friend Calvin at bedside and encouraging pt "to stay another day". Pt becoming anxious as Kerry Dory is also staying "if we have to call EMS tonight its your fault". Pt becoming increasingly anxious and upset. I explained situation to [facility] and asked him to stop making pt anxious. VSS and FSBS was WNL and pt states she feels "fine, and would like to be discharged". Cab called and Dr Posey Pronto spoke to patient and friend and both were in agreement to sign discharge papers. Scripts electronically submitted, pt knows to pick up. Pt also on PRN oxygen but prefers to wear it when she gets home :only if she needs it." Took patient out in wheelchair by nursing staff and left in cab.

## 2016-07-07 NOTE — Progress Notes (Signed)
  End of hd 

## 2016-07-07 NOTE — Progress Notes (Addendum)
Pt back from HD with complaints of dizziness, nausea, and blurred vision. FSBS checked and was 81, lunch tray here now and is eating and drinking. Zofran given once for nausea. MD at bedside and informed of situation, no new orders placed. VSS checked and WNL except slight elevation of BP. Will continue to monitor.

## 2016-07-21 ENCOUNTER — Emergency Department
Admission: EM | Admit: 2016-07-21 | Discharge: 2016-07-21 | Disposition: A | Discharge status: Home / Self Care | Payer: Medicare Other | Attending: Emergency Medicine | Admitting: Emergency Medicine

## 2016-07-21 ENCOUNTER — Encounter: Payer: Self-pay | Admitting: Emergency Medicine

## 2016-07-21 ENCOUNTER — Emergency Department: Payer: Medicare Other

## 2016-07-21 DIAGNOSIS — R519 Headache, unspecified: Secondary | ICD-10-CM

## 2016-07-21 DIAGNOSIS — R51 Headache: Secondary | ICD-10-CM | POA: Diagnosis not present

## 2016-07-21 DIAGNOSIS — H53149 Visual discomfort, unspecified: Secondary | ICD-10-CM | POA: Diagnosis not present

## 2016-07-21 DIAGNOSIS — Z79899 Other long term (current) drug therapy: Secondary | ICD-10-CM | POA: Insufficient documentation

## 2016-07-21 DIAGNOSIS — R0789 Other chest pain: Secondary | ICD-10-CM | POA: Insufficient documentation

## 2016-07-21 DIAGNOSIS — Z7982 Long term (current) use of aspirin: Secondary | ICD-10-CM | POA: Diagnosis not present

## 2016-07-21 DIAGNOSIS — Z992 Dependence on renal dialysis: Secondary | ICD-10-CM | POA: Insufficient documentation

## 2016-07-21 DIAGNOSIS — I5032 Chronic diastolic (congestive) heart failure: Secondary | ICD-10-CM | POA: Insufficient documentation

## 2016-07-21 DIAGNOSIS — J45909 Unspecified asthma, uncomplicated: Secondary | ICD-10-CM | POA: Diagnosis not present

## 2016-07-21 DIAGNOSIS — R110 Nausea: Secondary | ICD-10-CM | POA: Insufficient documentation

## 2016-07-21 DIAGNOSIS — J449 Chronic obstructive pulmonary disease, unspecified: Secondary | ICD-10-CM | POA: Insufficient documentation

## 2016-07-21 DIAGNOSIS — I132 Hypertensive heart and chronic kidney disease with heart failure and with stage 5 chronic kidney disease, or end stage renal disease: Secondary | ICD-10-CM | POA: Diagnosis not present

## 2016-07-21 DIAGNOSIS — E1122 Type 2 diabetes mellitus with diabetic chronic kidney disease: Secondary | ICD-10-CM | POA: Insufficient documentation

## 2016-07-21 DIAGNOSIS — N186 End stage renal disease: Secondary | ICD-10-CM | POA: Insufficient documentation

## 2016-07-21 DIAGNOSIS — Z87891 Personal history of nicotine dependence: Secondary | ICD-10-CM | POA: Insufficient documentation

## 2016-07-21 LAB — BASIC METABOLIC PANEL
Anion gap: 11 (ref 5–15)
BUN: 40 mg/dL — ABNORMAL HIGH (ref 6–20)
CALCIUM: 8.1 mg/dL — AB (ref 8.9–10.3)
CO2: 30 mmol/L (ref 22–32)
CREATININE: 7.73 mg/dL — AB (ref 0.44–1.00)
Chloride: 98 mmol/L — ABNORMAL LOW (ref 101–111)
GFR, EST AFRICAN AMERICAN: 6 mL/min — AB (ref >60)
GFR, EST NON AFRICAN AMERICAN: 5 mL/min — AB (ref >60)
Glucose, Bld: 76 mg/dL (ref 65–99)
Potassium: 4 mmol/L (ref 3.5–5.1)
Sodium: 139 mmol/L (ref 135–145)

## 2016-07-21 LAB — CBC WITH DIFFERENTIAL/PLATELET
BASOS ABS: 0.1 10*3/uL (ref 0–0.1)
BASOS PCT: 1 %
EOS ABS: 0.7 10*3/uL (ref 0–0.7)
EOS PCT: 10 %
HCT: 34.4 % — ABNORMAL LOW (ref 35.0–47.0)
Hemoglobin: 11.3 g/dL — ABNORMAL LOW (ref 12.0–16.0)
Lymphocytes Relative: 21 %
Lymphs Abs: 1.6 10*3/uL (ref 1.0–3.6)
MCH: 29.1 pg (ref 26.0–34.0)
MCHC: 32.9 g/dL (ref 32.0–36.0)
MCV: 88.5 fL (ref 80.0–100.0)
MONO ABS: 0.6 10*3/uL (ref 0.2–0.9)
MONOS PCT: 8 %
Neutro Abs: 4.4 10*3/uL (ref 1.4–6.5)
Neutrophils Relative %: 60 %
PLATELETS: 280 10*3/uL (ref 150–440)
RBC: 3.89 MIL/uL (ref 3.80–5.20)
RDW: 21.4 % — AB (ref 11.5–14.5)
WBC: 7.5 10*3/uL (ref 3.6–11.0)

## 2016-07-21 LAB — TROPONIN I: TROPONIN I: 0.03 ng/mL — AB (ref <0.03)

## 2016-07-21 MED ORDER — LORAZEPAM 1 MG PO TABS: 1.0000 mg | ORAL_TABLET | Freq: Two times a day (BID) | ORAL | 0 refills | Status: DC | PRN

## 2016-07-21 MED ORDER — ONDANSETRON HCL 4 MG/2ML IJ SOLN
4.0000 mg | Freq: Once | INTRAMUSCULAR | Status: AC
  Administered 2016-07-21: 4 mg via INTRAVENOUS
  Filled 2016-07-21: qty 2

## 2016-07-21 MED ORDER — TRAMADOL HCL 50 MG PO TABS: 50.0000 mg | ORAL_TABLET | Freq: Four times a day (QID) | ORAL | 0 refills | Status: DC | PRN

## 2016-07-21 MED ORDER — HYDROMORPHONE HCL 1 MG/ML IJ SOLN
1.0000 mg | Freq: Once | INTRAMUSCULAR | Status: AC
  Administered 2016-07-21: 1 mg via INTRAVENOUS
  Filled 2016-07-21: qty 1

## 2016-07-21 MED ORDER — ONDANSETRON HCL 4 MG PO TABS: 4.0000 mg | ORAL_TABLET | Freq: Three times a day (TID) | ORAL | 0 refills | Status: DC | PRN

## 2016-07-21 MED ORDER — LORAZEPAM 2 MG/ML IJ SOLN
1.0000 mg | Freq: Once | INTRAMUSCULAR | Status: AC
  Administered 2016-07-21: 1 mg via INTRAVENOUS
  Filled 2016-07-21: qty 1

## 2016-07-21 MED ORDER — PROMETHAZINE HCL 25 MG PO TABS
12.5000 mg | ORAL_TABLET | Freq: Once | ORAL | Status: AC
  Administered 2016-07-21: 12.5 mg via ORAL
  Filled 2016-07-21: qty 1

## 2016-07-21 NOTE — Discharge Instructions (Signed)
Please return immediately if condition worsens. Please contact her primary physician or the physician you were given for referral. If you have any specialist physicians involved in her treatment and plan please also contact them. Thank you for using [facility].  Please drink plenty of fluids and continue your normal medication and follow up with her dialysis.

## 2016-07-21 NOTE — ED Triage Notes (Signed)
Pt ems from dialysis center for HA 10/10, chest pain, htn. Pt completed all but 7 minutes of treatment. Pt has hx migranes, CHF, DM, CRF. EMS gave pt 324 of aspirin and 2" nitro paste. BS 97. Pt reports numbness and tingling left side. PERRL.

## 2016-07-21 NOTE — ED Notes (Signed)
Pt D/C to lobby, pt has cellphone and is attempting to call a ride home. Pt in NAD, pt in wheelchair and on 2L [place] per her usual at home oxygen. First nurse aware of pt

## 2016-07-21 NOTE — ED Provider Notes (Signed)
Time Seen: Approximately 1029  I have reviewed the triage notes  Chief Complaint: Headache   History of Present Illness: Jasmine Holloway is a 55 y.o. female who presents from the dialysis center after completing all but 7 minutes of her dialysis today. She normally has dialysis on Tuesday, Thursday, and Saturdays. Patient has a history of postdialysis headaches and describes a similar headache primarily right-sided with nausea and no persistent vomiting. Patient also describes some mild chest discomfort with her nausea. The patient denies any arm, jaw, back or flank discomfort. Patient also reports some numbness and tingling which is rather diffuse. The patient denies any focal weakness in either upper or lower extremities and has had similar symptoms again the past associated with her dialysis. Patient was found to be hypertensive at the scene and was given aspirin and 2 inches of nitroglycerin paste. Patient states she's not had her normal blood pressure medications today at this point.  Past Medical History:  Diagnosis Date  . Asthma   . COPD (chronic obstructive pulmonary disease) ([place])   . Diabetes mellitus without complication ([place])   . Diastolic heart failure ([place])   . ESRD (end stage renal disease) on dialysis ([place])   . Hypertension   . Neuropathy ([place])   . Polycystic kidney disease   . Renal insufficiency     Patient Active Problem List   Diagnosis Date Noted  . Acute pulmonary edema ([place]) 07/04/2016  . Acute respiratory failure ([place]) 06/09/2016  . Atypical chest pain 06/08/2016  . Chronic pain syndrome 06/08/2016  . Anemia in stage 4 chronic kidney disease ([place]) 06/08/2016  . CHF exacerbation ([place]) 04/28/2016  . Epigastric pain 03/28/2016  . Acute CHF (congestive heart failure) ([place]) 03/28/2016  . CHF (congestive heart failure) ([place]) 03/28/2016  . Chronic diastolic heart failure ([place]) 02/04/2016  . Hoarseness of voice 02/04/2016  . Acute on chronic diastolic CHF  (congestive heart failure) ([place]) 12/22/2015  . ESRD on dialysis ([place]) 12/22/2015  . Essential hypertension 12/22/2015  . Hyperlipidemia 12/22/2015    Past Surgical History:  Procedure Laterality Date  . ARTERIOVENOUS GRAFT PLACEMENT Right    x3 (R forearm currently used for access)  . COLON SURGERY    . VEIN HARVEST      Past Surgical History:  Procedure Laterality Date  . ARTERIOVENOUS GRAFT PLACEMENT Right    x3 (R forearm currently used for access)  . COLON SURGERY    . VEIN HARVEST      Current Outpatient Rx  . Order #: [id number] Class: OTC  . Order #: [id number] Class: Historical Med  . Order #: [id number] Class: Normal  . Order #: [id number] Class: Historical Med  . Order #: [id number] Class: Normal  . Order #: [id number] Class: Normal  . Order #: [id number] Class: Normal  . Order #: [id number] Class: Historical Med  . Order #: [id number] Class: Historical Med  . Order #: [id number] Class: Print  . Order #: [id number] Class: Normal  . Order #: [id number] Class: Historical Med  . Order #: [id number] Class: Historical Med  . Order #: [id number] Class: Print  . Order #: [id number] Class: Normal  . Order #: [id number] Class: Normal  . Order #: [id number] Class: Print    Allergies:  Morphine and related; Vicodin [hydrocodone-acetaminophen]; Buprenorphine hcl; and Codeine  Family History: Family History  Problem Relation Age of Onset  . Hypertension Brother   . Heart failure Brother   . Hypertension Mother   . Heart disease Mother     Social History: Social History  Substance Use Topics  . Smoking status:  Former Smoker    Packs/day: 0.25    Quit date: 08/11/2015  . Smokeless tobacco: Never Used  . Alcohol use No     Review of Systems:   10 point review of systems was performed and was otherwise negative:  Constitutional: No fever Eyes: No visual disturbances. Patient does describe some mild photophobia ENT: No sore throat, ear pain Cardiac: No chest pain Respiratory: No  shortness of breath, wheezing, or stridor Abdomen: No abdominal pain, no vomiting, No diarrhea Endocrine: No weight loss, No night sweats Extremities: No peripheral edema, cyanosis Skin: No rashes, easy bruising Neurologic: No focal weakness, trouble with speech or swollowing Urologic: No dysuria, Hematuria, or urinary frequency *  Physical Exam:  ED Triage Vitals  Enc Vitals Group     BP 07/21/16 1029 (!) 185/100     Pulse Rate 07/21/16 1029 77     Resp 07/21/16 1030 18     Temp 07/21/16 1029 97.4 F (36.3 C)     Temp Source 07/21/16 1029 Oral     SpO2 07/21/16 1029 99 %     Weight 07/21/16 1030 179 lb (81.2 kg)     Height 07/21/16 1030 5\' 3"  (1.6 m)     Head Circumference --      Peak Flow --      Pain Score 07/21/16 1030 10     Pain Loc --      Pain Edu? --      Excl. in [place]? --     General: Awake , Alert , and Oriented times 3; GCS 15  Head: Normal cephalic , atraumatic Eyes: Pupils equal , round, reactive to light Nose/Throat: No nasal drainage, patent upper airway without erythema or exudate.  Neck: Supple, Full range of motion, No anterior adenopathy or palpable thyroid masses Lungs: Clear to ascultation without wheezes , rhonchi, or rales Heart: Regular rate, regular rhythm without murmurs , gallops , or rubs Abdomen: Soft, non tender without rebound, guarding , or rigidity; bowel sounds positive and symmetric in all 4 quadrants. No organomegaly .        Extremities: 2 plus symmetric pulses. No edema, clubbing or cyanosis Neurologic: normal ambulation, Motor symmetric without deficits, sensory intact Skin: warm, dry, no rashes   Labs:   All laboratory work was reviewed including any pertinent negatives or positives listed below:  Labs Reviewed  CBC WITH DIFFERENTIAL/PLATELET - Abnormal; Notable for the following:       Result Value   Hemoglobin 11.3 (*)    HCT 34.4 (*)    RDW 21.4 (*)    All other components within normal limits  BASIC METABOLIC PANEL -  Abnormal; Notable for the following:    Chloride 98 (*)    BUN 40 (*)    Creatinine, Ser 7.73 (*)    Calcium 8.1 (*)    GFR calc non Af Amer 5 (*)    GFR calc Af Amer 6 (*)    All other components within normal limits  TROPONIN I - Abnormal; Notable for the following:    Troponin I 0.03 (*)    All other components within normal limits    EKG:  ED ECG REPORT I, Daymon Larsen, the attending physician, personally viewed and interpreted this ECG.  Date: 07/21/2016 EKG Time:1018 Rate:75 Rhythm: normal sinus rhythm, QRS Axis: normal Intervals: normal ST/T Wave abnormalities: Nonspecific T wave abnormalities Conduction Disturbances: none Narrative Interpretation: unremarkable Lead reversal Poor R-wave progression in the anterior leads and septal leads  without acute ischemic changes.  Radiology:  "Dg Chest 2 View  Result Date: 07/21/2016 CLINICAL DATA:  Chest pain, hypertension.  Dialysis. EXAM: CHEST  2 VIEW COMPARISON:  07/04/2016 FINDINGS: There is cardiomegaly with vascular congestion. Diffuse interstitial prominence throughout the lungs, likely mild edema. No visible effusions or acute bony abnormality. IMPRESSION: Cardiomegaly, mild pulmonary edema. Electronically Signed   By: Rolm Baptise M.D.   On: 07/21/2016 11:46   Ct Head Wo Contrast  Result Date: 07/21/2016 CLINICAL DATA:  55 year old female with acute headache and chest pain while on dialysis. Initial encounter. EXAM: CT HEAD WITHOUT CONTRAST TECHNIQUE: Contiguous axial images were obtained from the base of the skull through the vertex without intravenous contrast. COMPARISON:  Head CT 01/10/2016 and earlier. FINDINGS: Brain: No midline shift, ventriculomegaly, mass effect, evidence of mass lesion, intracranial hemorrhage or evidence of cortically based acute infarction. Minimal nonspecific white matter hypodensity. Otherwise Gray-white matter differentiation is within normal limits throughout the brain. Mild dystrophic  calcifications at the globus pallidus are unchanged. Vascular: No suspicious intracranial vascular hyperdensity. Skull: No osseous abnormality identified. Sinuses/Orbits: Chronic right lamina papyracea fracture. Visualized paranasal sinuses and mastoids are stable and well pneumatized. Other: Negative orbit and scalp soft tissues. IMPRESSION: Stable and normal for age noncontrast CT appearance of the brain. Electronically Signed   By: Genevie Ann M.D.   On: 07/21/2016 11:35   Dg Chest Portable 1 View  Result Date: 07/04/2016 CLINICAL DATA:  55 y/o F; severe shortness of breath. History of congestive heart failure on dialysis. EXAM: PORTABLE CHEST 1 VIEW COMPARISON:  06/10/2016 chest radiograph. FINDINGS: Interstitial markings and patchy opacities of the lungs probably represents moderate pulmonary edema. Stable cardiomegaly. Possible small left effusion. No pneumothorax. Left axillary surgical clips. Bones are unremarkable. IMPRESSION: Moderate pulmonary edema. Stable cardiomegaly. Possible small left effusion. Electronically Signed   By: Kristine Garbe M.D.   On: 07/04/2016 04:43  "  I personally reviewed the radiologic studies   ED Course:  Patient's stay here was uneventful and patient otherwise remained hemodynamically stable. She was initially discharged and then went to the lobby and stated that she still "" felt bad "". Patient states her headache and seems to progress when over she stands upright. She states similar headaches before with her dialysis and I felt she most likely was somewhat fluid depleted. Patient was given oral food and fluid here in emergency department or be discharged with prescription pain medication etc. Had review of her head CT was advised for completeness of workup to have a lumbar puncture which she electively declined at this time. Risks,  alternatives and options were discussed and the patient electively declines. I asked her my clinical suspicion for  life-threatening headache such as aneurysm, cavernous venous thrombosis, etc. was unlikely at this time. Clinical Course      Assessment: ** Post hemodialysis headache and chest pain *   Final Clinical Impression: *  Final diagnoses:  Nonintractable headache, unspecified chronicity pattern, unspecified headache type     Plan: * Outpatient " Discharge Medication List as of 07/21/2016 12:33 PM    START taking these medications   Details  LORazepam (ATIVAN) 1 MG tablet Take 1 tablet (1 mg total) by mouth 2 (two) times daily as needed for anxiety., Starting Tue 07/21/2016, Print    ondansetron (ZOFRAN) 4 MG tablet Take 1 tablet (4 mg total) by mouth every 8 (eight) hours as needed for nausea or vomiting., Starting Tue 07/21/2016, Print    traMADol Veatrice [place])  50 MG tablet Take 1 tablet (50 mg total) by mouth every 6 (six) hours as needed., Starting Tue 07/21/2016, Print      " Patient was advised to return immediately if condition worsens. Patient was advised to follow up with their primary care physician or other specialized physicians involved in their outpatient care. The patient and/or family member/power of attorney had laboratory results reviewed at the bedside. All questions and concerns were addressed and appropriate discharge instructions were distributed by the nursing staff.           Daymon Larsen, MD 07/21/16 [phone]

## 2016-08-04 ENCOUNTER — Ambulatory Visit
Admission: RE | Admit: 2016-08-04 | Discharge: 2016-08-05 | Disposition: A | Discharge status: Home / Self Care | Payer: Medicare Other | Source: Ambulatory Visit | Attending: Vascular Surgery | Admitting: Vascular Surgery

## 2016-08-04 ENCOUNTER — Encounter (INDEPENDENT_AMBULATORY_CARE_PROVIDER_SITE_OTHER): Payer: Self-pay | Admitting: Vascular Surgery

## 2016-08-04 ENCOUNTER — Encounter: Payer: Self-pay | Admitting: *Deleted

## 2016-08-04 ENCOUNTER — Ambulatory Visit (INDEPENDENT_AMBULATORY_CARE_PROVIDER_SITE_OTHER): Payer: Medicare Other | Admitting: Vascular Surgery

## 2016-08-04 ENCOUNTER — Encounter (INDEPENDENT_AMBULATORY_CARE_PROVIDER_SITE_OTHER): Payer: Self-pay

## 2016-08-04 ENCOUNTER — Encounter
Admission: RE | Disposition: A | Discharge status: Home / Self Care | Payer: Self-pay | Source: Ambulatory Visit | Attending: Vascular Surgery

## 2016-08-04 VITALS — BP 204/115 | HR 67 | Resp 16 | Ht 63.0 in | Wt 180.0 lb

## 2016-08-04 DIAGNOSIS — E1122 Type 2 diabetes mellitus with diabetic chronic kidney disease: Secondary | ICD-10-CM | POA: Diagnosis not present

## 2016-08-04 DIAGNOSIS — I132 Hypertensive heart and chronic kidney disease with heart failure and with stage 5 chronic kidney disease, or end stage renal disease: Secondary | ICD-10-CM | POA: Insufficient documentation

## 2016-08-04 DIAGNOSIS — Y832 Surgical operation with anastomosis, bypass or graft as the cause of abnormal reaction of the patient, or of later complication, without mention of misadventure at the time of the procedure: Secondary | ICD-10-CM | POA: Insufficient documentation

## 2016-08-04 DIAGNOSIS — T82868A Thrombosis of vascular prosthetic devices, implants and grafts, initial encounter: Secondary | ICD-10-CM

## 2016-08-04 DIAGNOSIS — T82858A Stenosis of vascular prosthetic devices, implants and grafts, initial encounter: Secondary | ICD-10-CM | POA: Diagnosis present

## 2016-08-04 DIAGNOSIS — N186 End stage renal disease: Secondary | ICD-10-CM

## 2016-08-04 DIAGNOSIS — Z992 Dependence on renal dialysis: Secondary | ICD-10-CM

## 2016-08-04 DIAGNOSIS — Z79899 Other long term (current) drug therapy: Secondary | ICD-10-CM | POA: Diagnosis not present

## 2016-08-04 DIAGNOSIS — J449 Chronic obstructive pulmonary disease, unspecified: Secondary | ICD-10-CM | POA: Diagnosis not present

## 2016-08-04 DIAGNOSIS — Z87891 Personal history of nicotine dependence: Secondary | ICD-10-CM | POA: Diagnosis not present

## 2016-08-04 DIAGNOSIS — E785 Hyperlipidemia, unspecified: Secondary | ICD-10-CM

## 2016-08-04 DIAGNOSIS — I5032 Chronic diastolic (congestive) heart failure: Secondary | ICD-10-CM

## 2016-08-04 DIAGNOSIS — I5030 Unspecified diastolic (congestive) heart failure: Secondary | ICD-10-CM | POA: Diagnosis not present

## 2016-08-04 DIAGNOSIS — Z7982 Long term (current) use of aspirin: Secondary | ICD-10-CM | POA: Insufficient documentation

## 2016-08-04 DIAGNOSIS — I10 Essential (primary) hypertension: Secondary | ICD-10-CM

## 2016-08-04 HISTORY — PX: PERIPHERAL VASCULAR CATHETERIZATION: SHX172C

## 2016-08-04 LAB — POTASSIUM (ARMC VASCULAR LAB ONLY): Potassium (ARMC vascular lab): 5.2 — ABNORMAL HIGH (ref 3.5–5.1)

## 2016-08-04 SURGERY — A/V SHUNTOGRAM
Anesthesia: Moderate Sedation | Laterality: Right

## 2016-08-04 MED ORDER — CEFAZOLIN IN D5W 1 GM/50ML IV SOLN: 1.0000 g | Freq: Once | INTRAVENOUS | Status: DC

## 2016-08-04 MED ORDER — HEPARIN SODIUM (PORCINE) 1000 UNIT/ML IJ SOLN
INTRAMUSCULAR | Status: AC
  Filled 2016-08-04: qty 1

## 2016-08-04 MED ORDER — SODIUM CHLORIDE 0.9 % IV SOLN: INTRAVENOUS | Status: DC

## 2016-08-04 MED ORDER — MIDAZOLAM HCL 5 MG/5ML IJ SOLN
INTRAMUSCULAR | Status: AC
  Filled 2016-08-04: qty 5

## 2016-08-04 MED ORDER — MIDAZOLAM HCL 2 MG/2ML IJ SOLN
INTRAMUSCULAR | Status: DC | PRN
  Administered 2016-08-04: 2 mg via INTRAVENOUS
  Administered 2016-08-04: 1 mg via INTRAVENOUS

## 2016-08-04 MED ORDER — FENTANYL CITRATE (PF) 100 MCG/2ML IJ SOLN
INTRAMUSCULAR | Status: DC | PRN
  Administered 2016-08-04: 25 ug via INTRAVENOUS
  Administered 2016-08-04: 50 ug via INTRAVENOUS

## 2016-08-04 MED ORDER — LIDOCAINE HCL (PF) 1 % IJ SOLN
INTRAMUSCULAR | Status: AC
  Filled 2016-08-04: qty 30

## 2016-08-04 MED ORDER — FENTANYL CITRATE (PF) 100 MCG/2ML IJ SOLN
INTRAMUSCULAR | Status: AC
  Filled 2016-08-04: qty 2

## 2016-08-04 MED ORDER — HEPARIN (PORCINE) IN NACL 2-0.9 UNIT/ML-% IJ SOLN
INTRAMUSCULAR | Status: AC
  Filled 2016-08-04: qty 1000

## 2016-08-04 MED ORDER — HEPARIN SODIUM (PORCINE) 1000 UNIT/ML IJ SOLN
INTRAMUSCULAR | Status: DC | PRN
  Administered 2016-08-04: 3000 [IU] via INTRAVENOUS

## 2016-08-04 MED ORDER — IOPAMIDOL (ISOVUE-300) INJECTION 61%
INTRAVENOUS | Status: DC | PRN
  Administered 2016-08-04: 25 mL via INTRAVENOUS

## 2016-08-04 SURGICAL SUPPLY — 15 items
BALLN ULTRVRSE 8X100X75 (BALLOONS) ×3
BALLOON ULTRVRSE 8X100X75 (BALLOONS) ×1 IMPLANT
CANNULA 5F STIFF (CANNULA) ×3 IMPLANT
CATH TORCON 5FR 0.38 (CATHETERS) ×3 IMPLANT
DEVICE PRESTO INFLATION (MISCELLANEOUS) ×3 IMPLANT
DRAPE BRACHIAL (DRAPES) ×3 IMPLANT
PACK ANGIOGRAPHY (CUSTOM PROCEDURE TRAY) ×3 IMPLANT
SHEATH BRITE TIP 7FRX5.5 (SHEATH) ×3 IMPLANT
STENT VIABAHN 8X100X120 (Permanent Stent) ×2 IMPLANT
STENT VIABAHN 8X10X120 (Permanent Stent) ×1 IMPLANT
STENT VIABAHN 8X50X120 (Permanent Stent) ×3 IMPLANT
STENT VIABAHN5X120X8X (Permanent Stent) ×1 IMPLANT
SUT MNCRL AB 4-0 PS2 18 (SUTURE) ×3 IMPLANT
TOWEL OR 17X26 4PK STRL BLUE (TOWEL DISPOSABLE) ×3 IMPLANT
WIRE G 018X200 V18 (WIRE) ×3 IMPLANT

## 2016-08-04 NOTE — Assessment & Plan Note (Signed)
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 It is not clear to me if this is her arterial or venous access site, but this is quite large and will need to be addressed. I'll offer the patient 2 options 1 being a covered stent to exclude the aneurysm and the other being a surgical revision with a new jump graft. The second option with the jump graft would require a dialysis catheter for several weeks. She would prefer the covered stent and we will try to get that scheduled in the very near future.

## 2016-08-04 NOTE — H&P (Signed)
[facility] History & Physical Update  The patient was interviewed and re-examined.  The patient's previous History and Physical has been reviewed and is unchanged.  There is no change in the plan of care. We plan to proceed with the scheduled procedure.  Leotis Pain, MD  08/04/2016, 12:25 PM

## 2016-08-04 NOTE — Discharge Instructions (Signed)
Fistulogram, Care After °Introduction °Refer to this sheet in the next few weeks. These instructions provide you with information on caring for yourself after your procedure. Your health care provider may also give you more specific instructions. Your treatment has been planned according to current medical practices, but problems sometimes occur. Call your health care provider if you have any problems or questions after your procedure. °What can I expect after the procedure? °After your procedure, it is typical to have the following: °· A small amount of discomfort in the area where the catheters were placed. °· A small amount of bruising around the fistula. °· Sleepiness and fatigue. °Follow these instructions at home: °· Rest at home for the day following your procedure. °· Do not drive or operate heavy machinery while taking pain medicine. °· Take medicines only as directed by your health care provider. °· Do not take baths, swim, or use a hot tub until your health care provider approves. You may shower 24 hours after the procedure or as directed by your health care provider. °· There are many different ways to close and cover an incision, including stitches, skin glue, and adhesive strips. Follow your health care provider's instructions on: °¨ Incision care. °¨ Bandage (dressing) changes and removal. °¨ Incision closure removal. °· Monitor your dialysis fistula carefully. °Contact a health care provider if: °· You have drainage, redness, swelling, or pain at your catheter site. °· You have a fever. °· You have chills. °Get help right away if: °· You feel weak. °· You have trouble balancing. °· You have trouble moving your arms or legs. °· You have problems with your speech or vision. °· You can no longer feel a vibration or buzz when you put your fingers over your dialysis fistula. °· The limb that was used for the procedure: °¨ Swells. °¨ Is painful. °¨ Is cold. °¨ Is discolored, such as blue or pale  white. °This information is not intended to replace advice given to you by your health care provider. Make sure you discuss any questions you have with your health care provider. °Document Released: 12/11/2013 Document Revised: 01/02/2016 Document Reviewed: 09/15/2013 °© 2017 Elsevier ° °

## 2016-08-04 NOTE — Op Note (Signed)
[facility]    OPERATIVE NOTE   PROCEDURE: 1.  Right brachial artery to brachial vein arteriovenous graft cannulation under ultrasound guidance 2.  Right arm shuntogram 3.  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 ESRD 2. 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 Mild stenosis at the arterial anastomosis and mild stenosis in the brachial vein outflow in the mid to the distal upper arm but neither appeared to be more than 30-40%. There was a large aneurysmal degeneration of the arterial access site and a small to medium sized aneurysmal degeneration of the venous access site. The central venous circulation was patent.  SPECIMEN(S):  None  CONTRAST: 25 cc  FLUORO TIME: 3 minutes  MODERATE CONSCIOUS SEDATION TIME:  Approximately 30 minutes using 3 mg of Versed and 75 mcg of Fentanyl  INDICATIONS: Jasmine Holloway is a 55 y.o. 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  The patient is scheduled for  right arm shuntogram.  The patient is aware the risks include but are not limited to: bleeding, infection, thrombosis of the cannulated access, and possible anaphylactic reaction to the contrast.  The patient is aware of the risks of the procedure and elects to proceed forward.  DESCRIPTION: After full informed written consent was obtained, the patient was brought back to the angiography suite and placed supine upon the angiography table.  The patient was connected to monitoring equipment. Moderate conscious sedation was  administered during a face to face encounter throughout the procedure with my supervision of the RN administering medicines and monitoring the patient's vital signs, pulse oximetry, telemetry and mental status throughout from the start of the procedure until the patient was taken to the recovery room The  right arm was prepped and draped in the standard fashion for a percutaneous access intervention.  Under ultrasound guidance, the right brachial artery to brachial vein arteriovenous graft was cannulated with a micropuncture needle under direct ultrasound guidance in a retrograde fashion between the venous access site and the venous anastomosis and a permanent image was performed.  The microwire was advanced into the fistula and the needle was exchanged for the a microsheath.  I then upsized to a 7 Fr Sheath and imaging was performed. To opacify the arterial anastomosis and the proximal portions of the graft, a Kumpe catheter with the help of a 0.018 wire was placed near the arterial anastomosis for imaging  Hand injections were completed to image the access including the central venous system. This demonstrated mild stenosis at the arterial anastomosis and mild stenosis in the brachial vein outflow in the mid to the distal upper arm but neither appeared to be more than 30-40%. There was a large aneurysmal degeneration of the arterial access site and a small to medium sized aneurysmal degeneration of the venous access site. The central venous circulation was patent.  Based on the images, this patient will need covered stent placement to the large aneurysmal arterial access site. I then gave the patient 3000 units of intravenous heparin.  I then crossed the pseudoaneurysm with a 0.018  wire.  We did not have an 8 mm diameter by 15 cm length Viabahn stent on the shelf, and this would've been the size required to successfully exclude the aneurysm. A 25 cm length stent would jeopardize the arterial anastomosis.  Therefore, I selected an 8 mm diameter by 10 cm length Viabahn stent and an 8 millimeter diameter by 5 cm length Viabahn stent with about 1-2 cm of overlap. These were deployed about 4-5 cm from the arterial anastomosis proximally in the distal deployment was beyond the aneurysmal access site close to the apex of the graft. These were postdilated with an 8 mm balloon with excellent angiographic completion result. There was no significant residual stenosis and no filling of the aneurysm apparent.   Based on the completion imaging, no further intervention is necessary.  The wire and balloon were removed from the sheath.  A 4-0 Monocryl purse-string suture was sewn around the sheath.  The sheath was removed while tying down the suture.  A sterile bandage was applied to the puncture site.  COMPLICATIONS: None  CONDITION: Stable   Leotis Pain  08/04/2016 3:04 PM    This note was created with Dragon Medical transcription system. Any errors in dictation are purely unintentional.

## 2016-08-04 NOTE — Assessment & Plan Note (Signed)
No recent symptoms.  Sees cardiology

## 2016-08-04 NOTE — Patient Instructions (Signed)
AV Fistula Placement, Care After Refer to this sheet in the next few weeks. These instructions provide you with information about caring for yourself after your procedure. Your health care provider may also give you more specific instructions. Your treatment has been planned according to current medical practices, but problems sometimes occur. Call your health care provider if you have any problems or questions after your procedure. What can I expect after the procedure? After your procedure, it is common to:  Feel sore.  Have numbness.  Feel cold.  Feel a vibration (thrill) over the fistula. Follow these instructions at home:   Incision care   Do not take baths or showers, swim, or use a hot tub until your health care provider approves.  Keep the area around your cut from surgery (incision) clean and dry.  Follow instructions from your health care provider about how to take care of your incision. Make sure you:  Wash your hands with soap and water before you change your bandage (dressing). If soap and water are not available, use hand sanitizer.  Change your dressing as told by your health care provider.  Leave stitches (sutures) and clips in place. They may need to stay in place for 2 weeks or longer. Fistula Care   Check your fistula site every day to make sure the thrill feels the same.  Check your fistula site every day for signs of infection. Watch for:  Redness.  Swelling.  Discharge.  Tenderness.  Enlargement.  Keep your arm raised (elevated) while you rest.  Do not lift anything that is heavier than a gallon of milk with the arm that has the fistula.  Do not lie down on your fistula arm.  Do not let anyone draw blood or take a blood pressure reading on your fistula arm.  Do not wear tight jewelry or clothing over your fistula arm. General instructions   Rest at home for a day or two.  Gradually start doing your usual activities again. Ask your surgeon  when you can return to work or school.  Take over-the-counter and prescription medicines only as told by your health care provider.  Keep all follow-up visits as told by your health care provider. This is important. Contact a health care provider if:  You have chills or a fever.  You have pain at your fistula site that is not going away.  You have numbness or coldness at your fistula site that is not going away.  You feel a decrease or a change in the thrill.  You have swelling in your arm or hand.  You have redness, swelling, discharge, tenderness, or enlargement at your fistula site. Get help right away if:  You are bleeding from your fistula site.  You have chest pain.  You have trouble breathing. This information is not intended to replace advice given to you by your health care provider. Make sure you discuss any questions you have with your health care provider. Document Released: 07/27/2005 Document Revised: 12/04/2015 Document Reviewed: 10/17/2014 Elsevier Interactive Patient Education  2017 [employer]  

## 2016-08-04 NOTE — Assessment & Plan Note (Signed)
blood pressure control important in reducing the progression of atherosclerotic disease. On appropriate oral medications.  

## 2016-08-04 NOTE — Progress Notes (Signed)
Patient ID: Jasmine Holloway, female   DOB: Jun 17, 1961, 55 y.o.   MRN: [id number]  Chief Complaint  Patient presents with  . New Evaluation    Aneurysm of skin    HPI Jasmine Holloway is a 55 y.o. female.  I am asked to see the patient by Dr. 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  The patient reports this being her access for dialysis for about 5 years now. It has required one intervention. This was done in [place]. It has been working reasonably well, but the area on the medial arm access site has gotten markedly aneurysmal. The lateral access site is mild to moderately aneurysmal. She is very concerned about prolonged bleeding and risk of bleeding from the access site and is referred for evaluation of this. She denies fevers or chills. She denies chest pain or shortness of breath   Past Medical History:  Diagnosis Date  . Asthma   . COPD (chronic obstructive pulmonary disease) ([place])   . Diabetes mellitus without complication ([place])   . Diastolic heart failure ([place])   . ESRD (end stage renal disease) on dialysis ([place])   . Hypertension   . Neuropathy ([place])   . Polycystic kidney disease   . Renal insufficiency     Past Surgical History:  Procedure Laterality Date  . ARTERIOVENOUS GRAFT PLACEMENT Right    x3 (R forearm currently used for access)  . COLON SURGERY    . VEIN HARVEST      Family History  Problem Relation Age of Onset  . Hypertension Brother   . Heart failure Brother   . Hypertension Mother   . Heart disease Mother   No bleeding or clotting disorders  Social History Social History  Substance Use Topics  . Smoking status: Former Smoker    Packs/day: 0.25    Quit date: 08/11/2015  . Smokeless tobacco: Never Used  . Alcohol use No  No IV drug use  Allergies  Allergen Reactions  . Morphine And Related Nausea And Vomiting  . Vicodin [Hydrocodone-Acetaminophen] Nausea And Vomiting  . Buprenorphine Hcl Nausea And Vomiting  . Codeine Nausea  And Vomiting    Current Outpatient Prescriptions  Medication Sig Dispense Refill  . acetaminophen (TYLENOL) 325 MG tablet Take 2 tablets (650 mg total) by mouth every 6 (six) hours as needed for mild pain or headache.    Marland Kitchen amLODipine (NORVASC) 5 MG tablet Take 5 mg by mouth daily.     Marland Kitchen aspirin 81 MG chewable tablet Chew 1 tablet (81 mg total) by mouth daily. 30 tablet 2  . b complex-vitamin c-folic acid (NEPHRO-VITE) 0.8 MG TABS tablet Take 1 tablet by mouth at bedtime.    . calcium acetate (PHOSLO) 667 MG capsule Take 3 capsules (2,001 mg total) by mouth 3 (three) times daily with meals. 90 capsule 1  . cloNIDine (CATAPRES) 0.1 MG tablet Take 1 tablet (0.1 mg total) by mouth 2 (two) times daily. 60 tablet 2  . gabapentin (NEURONTIN) 300 MG capsule Take 300 mg by mouth 2 (two) times daily.     . hydrALAZINE (APRESOLINE) 50 MG tablet     . irbesartan (AVAPRO) 150 MG tablet Take 1 tablet by mouth daily.    Marland Kitchen LORazepam (ATIVAN) 1 MG tablet Take 1 tablet (1 mg total) by mouth 2 (two) times daily as needed for anxiety. 10 tablet 0  . losartan (COZAAR) 25 MG tablet     . metolazone (ZAROXOLYN) 5  MG tablet Take 1 tablet (5 mg total) by mouth daily as needed. 30 tablet 2  . metoprolol (LOPRESSOR) 50 MG tablet     . metoprolol succinate (TOPROL-XL) 50 MG 24 hr tablet Take 1 tablet by mouth daily.    Marland Kitchen omeprazole (PRILOSEC) 20 MG capsule Take 20 mg by mouth 2 (two) times daily.     . ondansetron (ZOFRAN) 4 MG tablet Take 1 tablet (4 mg total) by mouth every 8 (eight) hours as needed for nausea or vomiting. 21 tablet 0  . oxyCODONE-acetaminophen (PERCOCET) 10-325 MG tablet     . rosuvastatin (CRESTOR) 5 MG tablet Take 1 tablet (5 mg total) by mouth at bedtime. 30 tablet 0  . SENSIPAR 30 MG tablet Take 1 tablet (30 mg total) by mouth daily. 60 tablet 1  . traMADol (ULTRAM) 50 MG tablet Take 1 tablet (50 mg total) by mouth every 6 (six) hours as needed. 20 tablet 0  . VENTOLIN HFA 108 (90 Base) MCG/ACT  inhaler     . furosemide (LASIX) 80 MG tablet Take 1 tablet (80 mg total) by mouth daily. 90 tablet 3   No current facility-administered medications for this visit.       REVIEW OF SYSTEMS (Negative unless checked)  Constitutional: '[]'$ Weight loss  '[]'$ Fever  '[]'$ Chills Cardiac: '[]'$ Chest pain   '[]'$ Chest pressure   '[]'$ Palpitations   '[]'$ Shortness of breath when laying flat   '[]'$ Shortness of breath at rest   '[]'$ Shortness of breath with exertion. Vascular:  '[]'$ Pain in legs with walking   '[]'$ Pain in legs at rest   '[]'$ Pain in legs when laying flat   '[]'$ Claudication   '[]'$ Pain in feet when walking  '[]'$ Pain in feet at rest  '[]'$ Pain in feet when laying flat   '[]'$ History of DVT   '[]'$ Phlebitis   '[]'$ Swelling in legs   '[]'$ Varicose veins   '[]'$ Non-healing ulcers Pulmonary:   '[]'$ Uses home oxygen   '[]'$ Productive cough   '[]'$ Hemoptysis   '[]'$ Wheeze  '[]'$ COPD   '[]'$ Asthma Neurologic:  '[]'$ Dizziness  '[]'$ Blackouts   '[]'$ Seizures   '[]'$ History of stroke   '[]'$ History of TIA  '[]'$ Aphasia   '[]'$ Temporary blindness   '[]'$ Dysphagia   '[]'$ Weakness or numbness in arms   '[]'$ Weakness or numbness in legs Musculoskeletal:  '[x]'$ Arthritis   '[]'$ Joint swelling   '[]'$ Joint pain   '[]'$ Low back pain Hematologic:  '[]'$ Easy bruising  '[]'$ Easy bleeding   '[]'$ Hypercoagulable state   '[]'$ Anemic  '[]'$ Hepatitis Gastrointestinal:  '[]'$ Blood in stool   '[]'$ Vomiting blood  '[]'$ Gastroesophageal reflux/heartburn   '[]'$ Abdominal pain Genitourinary:  '[x]'$ Chronic kidney disease   '[]'$ Difficult urination  '[]'$ Frequent urination  '[]'$ Burning with urination   '[]'$ Hematuria Skin:  '[]'$ Rashes   '[]'$ Ulcers   '[]'$ Wounds Psychological:  '[]'$ History of anxiety   '[]'$  History of major depression.    Physical Exam BP (!) 204/115 (BP Location: Left Arm)   Pulse 67   Resp 16   Ht '5\' 3"'$  (1.6 m)   Wt 180 lb (81.6 kg)   BMI 31.89 kg/m  Gen:  WD/WN, NAD.  Head: [place]/AT, No temporalis wasting. Prominent temp pulse not noted. Ear/Nose/Throat: Hearing grossly intact, nares w/o erythema or drainage, oropharynx w/o Erythema/Exudate Eyes: Conjunctiva  clear, sclera non-icteric  Neck: trachea midline.  No JVD.  Pulmonary:  Good air movement, abrasions not labored, equal bilaterally.  Cardiac: RRR, normal S1, S2. Vascular: Right loop forearm AV graft with good thrill and bruit. Access sites are aneurysmal particularly on the medial portion of the forearm. No active bleeding Vessel Right Left  Radial  Palpable Palpable                                   Gastrointestinal: soft, non-tender/non-distended. No guarding/reflex. No masses, surgical incisions, or scars. Musculoskeletal: M/S 5/5 throughout.  Extremities without ischemic changes.  No deformity or atrophy.  Neurologic: Sensation grossly intact in extremities.  Symmetrical.  Speech is fluent. Motor exam as listed above. Psychiatric: Judgment intact, Mood & affect appropriate for pt's clinical situation. Dermatologic: No rashes or ulcers noted.  No cellulitis or open wounds. Lymph : No Cervical, Axillary, or Inguinal lymphadenopathy.   Radiology Dg Chest 2 View  Result Date: 07/21/2016 CLINICAL DATA:  Chest pain, hypertension.  Dialysis. EXAM: CHEST  2 VIEW COMPARISON:  07/04/2016 FINDINGS: There is cardiomegaly with vascular congestion. Diffuse interstitial prominence throughout the lungs, likely mild edema. No visible effusions or acute bony abnormality. IMPRESSION: Cardiomegaly, mild pulmonary edema. Electronically Signed   By: Rolm Baptise M.D.   On: 07/21/2016 11:46   Ct Head Wo Contrast  Result Date: 07/21/2016 CLINICAL DATA:  55 year old female with acute headache and chest pain while on dialysis. Initial encounter. EXAM: CT HEAD WITHOUT CONTRAST TECHNIQUE: Contiguous axial images were obtained from the base of the skull through the vertex without intravenous contrast. COMPARISON:  Head CT 01/10/2016 and earlier. FINDINGS: Brain: No midline shift, ventriculomegaly, mass effect, evidence of mass lesion, intracranial hemorrhage or evidence of cortically based acute infarction.  Minimal nonspecific white matter hypodensity. Otherwise Gray-white matter differentiation is within normal limits throughout the brain. Mild dystrophic calcifications at the globus pallidus are unchanged. Vascular: No suspicious intracranial vascular hyperdensity. Skull: No osseous abnormality identified. Sinuses/Orbits: Chronic right lamina papyracea fracture. Visualized paranasal sinuses and mastoids are stable and well pneumatized. Other: Negative orbit and scalp soft tissues. IMPRESSION: Stable and normal for age noncontrast CT appearance of the brain. Electronically Signed   By: Genevie Ann M.D.   On: 07/21/2016 11:35    Labs Recent Results (from the past 2160 hour(s))  CBC     Status: Abnormal   Collection Time: 05/11/16  6:21 AM  Result Value Ref Range   WBC 10.3 3.6 - 11.0 K/uL   RBC 3.48 (L) 3.80 - 5.20 MIL/uL   Hemoglobin 10.1 (L) 12.0 - 16.0 g/dL   HCT 30.4 (L) 35.0 - 47.0 %   MCV 87.4 80.0 - 100.0 fL   MCH 29.0 26.0 - 34.0 pg   MCHC 33.2 32.0 - 36.0 g/dL   RDW 22.2 (H) 11.5 - 14.5 %   Platelets 289 150 - 440 K/uL  Comprehensive metabolic panel     Status: Abnormal   Collection Time: 05/11/16  6:21 AM  Result Value Ref Range   Sodium 141 135 - 145 mmol/L   Potassium 4.9 3.5 - 5.1 mmol/L   Chloride 101 101 - 111 mmol/L   CO2 28 22 - 32 mmol/L   Glucose, Bld 106 (H) 65 - 99 mg/dL   BUN 56 (H) 6 - 20 mg/dL   Creatinine, Ser 10.41 (H) 0.44 - 1.00 mg/dL   Calcium 8.4 (L) 8.9 - 10.3 mg/dL   Total Protein 7.6 6.5 - 8.1 g/dL   Albumin 3.4 (L) 3.5 - 5.0 g/dL   AST 49 (H) 15 - 41 U/L   ALT 39 14 - 54 U/L   Alkaline Phosphatase 71 38 - 126 U/L   Total Bilirubin 0.6 0.3 - 1.2 mg/dL   GFR calc  non Af Amer 4 (L) >60 mL/min   GFR calc Af Amer 4 (L) >60 mL/min    Comment: (NOTE) The eGFR has been calculated using the CKD EPI equation. This calculation has not been validated in all clinical situations. eGFR's persistently <60 mL/min signify possible Chronic Kidney Disease.    Anion  gap 12 5 - 15  Troponin I     Status: Abnormal   Collection Time: 05/11/16  6:21 AM  Result Value Ref Range   Troponin I 0.05 (HH) <0.03 ng/mL    Comment: CRITICAL RESULT CALLED TO, READ BACK BY AND VERIFIED WITH  KIM GAULT AT 5956 05/11/16 SDR   Brain natriuretic peptide     Status: Abnormal   Collection Time: 05/11/16  6:28 AM  Result Value Ref Range   B Natriuretic Peptide 2,105.0 (H) 0.0 - 100.0 pg/mL  Glucose, capillary     Status: Abnormal   Collection Time: 05/11/16 10:20 AM  Result Value Ref Range   Glucose-Capillary 105 (H) 65 - 99 mg/dL  MRSA PCR Screening     Status: None   Collection Time: 05/11/16 10:26 AM  Result Value Ref Range   MRSA by PCR NEGATIVE NEGATIVE    Comment:        The GeneXpert MRSA Assay (FDA approved for NASAL specimens only), is one component of a comprehensive MRSA colonization surveillance program. It is not intended to diagnose MRSA infection nor to guide or monitor treatment for MRSA infections.   Glucose, capillary     Status: None   Collection Time: 05/11/16 11:01 AM  Result Value Ref Range   Glucose-Capillary 96 65 - 99 mg/dL  Hepatitis B surface antigen     Status: None   Collection Time: 05/11/16  3:23 PM  Result Value Ref Range   Hepatitis B Surface Ag Negative Negative    Comment: (NOTE) Performed At: [facility] [place], [place] [id number] Lindon Romp MD RJ:[id number]   Hepatitis B surface antibody     Status: None   Collection Time: 05/11/16  3:23 PM  Result Value Ref Range   Hep B S Ab Reactive     Comment: (NOTE)              Non Reactive: Inconsistent with immunity,                            less than 10 mIU/mL              Reactive:     Consistent with immunity,                            greater than 9.9 mIU/mL Performed At: [facility] [place], [place] [id number] Lindon Romp MD [id number]   Hepatitis B core antibody, total     Status: Abnormal    Collection Time: 05/11/16  3:23 PM  Result Value Ref Range   Hep B Core Total Ab Positive (A) Negative    Comment: (NOTE) **Verified by repeat analysis** Performed At: [facility] [place], [place] [id number] Lindon Romp MD BT:[id number]   Glucose, capillary     Status: Abnormal   Collection Time: 05/11/16  9:12 PM  Result Value Ref Range   Glucose-Capillary 142 (H) 65 - 99 mg/dL  Basic metabolic panel     Status: Abnormal   Collection Time:  05/12/16  4:07 AM  Result Value Ref Range   Sodium 142 135 - 145 mmol/L   Potassium 4.5 3.5 - 5.1 mmol/L   Chloride 102 101 - 111 mmol/L   CO2 33 (H) 22 - 32 mmol/L   Glucose, Bld 97 65 - 99 mg/dL   BUN 31 (H) 6 - 20 mg/dL   Creatinine, Ser 7.11 (H) 0.44 - 1.00 mg/dL   Calcium 8.2 (L) 8.9 - 10.3 mg/dL   GFR calc non Af Amer 6 (L) >60 mL/min   GFR calc Af Amer 7 (L) >60 mL/min    Comment: (NOTE) The eGFR has been calculated using the CKD EPI equation. This calculation has not been validated in all clinical situations. eGFR's persistently <60 mL/min signify possible Chronic Kidney Disease.    Anion gap 7 5 - 15  CBC     Status: Abnormal   Collection Time: 05/12/16  4:07 AM  Result Value Ref Range   WBC 8.7 3.6 - 11.0 K/uL   RBC 3.35 (L) 3.80 - 5.20 MIL/uL   Hemoglobin 9.7 (L) 12.0 - 16.0 g/dL   HCT 29.3 (L) 35.0 - 47.0 %   MCV 87.4 80.0 - 100.0 fL   MCH 29.1 26.0 - 34.0 pg   MCHC 33.3 32.0 - 36.0 g/dL   RDW 22.5 (H) 11.5 - 14.5 %   Platelets 272 150 - 440 K/uL  Glucose, capillary     Status: None   Collection Time: 05/12/16  7:55 AM  Result Value Ref Range   Glucose-Capillary 97 65 - 99 mg/dL  Renal function panel     Status: Abnormal   Collection Time: 05/12/16  9:00 AM  Result Value Ref Range   Sodium 140 135 - 145 mmol/L   Potassium 4.7 3.5 - 5.1 mmol/L   Chloride 100 (L) 101 - 111 mmol/L   CO2 30 22 - 32 mmol/L   Glucose, Bld 95 65 - 99 mg/dL   BUN 34 (H) 6 - 20 mg/dL   Creatinine, Ser 7.88  (H) 0.44 - 1.00 mg/dL   Calcium 8.3 (L) 8.9 - 10.3 mg/dL   Phosphorus 3.7 2.5 - 4.6 mg/dL   Albumin 3.2 (L) 3.5 - 5.0 g/dL   GFR calc non Af Amer 5 (L) >60 mL/min   GFR calc Af Amer 6 (L) >60 mL/min    Comment: (NOTE) The eGFR has been calculated using the CKD EPI equation. This calculation has not been validated in all clinical situations. eGFR's persistently <60 mL/min signify possible Chronic Kidney Disease.    Anion gap 10 5 - 15  CBC     Status: Abnormal   Collection Time: 05/12/16  9:00 AM  Result Value Ref Range   WBC 9.3 3.6 - 11.0 K/uL   RBC 3.39 (L) 3.80 - 5.20 MIL/uL   Hemoglobin 9.9 (L) 12.0 - 16.0 g/dL   HCT 29.8 (L) 35.0 - 47.0 %   MCV 87.7 80.0 - 100.0 fL   MCH 29.3 26.0 - 34.0 pg   MCHC 33.4 32.0 - 36.0 g/dL   RDW 21.8 (H) 11.5 - 14.5 %   Platelets 273 150 - 440 K/uL  Glucose, capillary     Status: None   Collection Time: 05/12/16  1:43 PM  Result Value Ref Range   Glucose-Capillary 88 65 - 99 mg/dL  Troponin I     Status: Abnormal   Collection Time: 06/09/16  2:36 AM  Result Value Ref Range   Troponin I 0.04 (HH) <  0.03 ng/mL    Comment: CRITICAL RESULT CALLED TO, READ BACK BY AND VERIFIED WITH IRIS GUIDRY ON 06/09/16 AT 0309 BY TLB   Brain natriuretic peptide     Status: Abnormal   Collection Time: 06/09/16  2:36 AM  Result Value Ref Range   B Natriuretic Peptide 1,759.0 (H) 0.0 - 100.0 pg/mL  CBC with Differential     Status: Abnormal   Collection Time: 06/09/16  2:36 AM  Result Value Ref Range   WBC 8.0 3.6 - 11.0 K/uL   RBC 3.62 (L) 3.80 - 5.20 MIL/uL   Hemoglobin 10.6 (L) 12.0 - 16.0 g/dL   HCT 31.7 (L) 35.0 - 47.0 %   MCV 87.7 80.0 - 100.0 fL   MCH 29.3 26.0 - 34.0 pg   MCHC 33.4 32.0 - 36.0 g/dL   RDW 23.7 (H) 11.5 - 14.5 %   Platelets 314 150 - 440 K/uL   Neutrophils Relative % 69 %   Neutro Abs 5.5 1.4 - 6.5 K/uL   Lymphocytes Relative 16 %   Lymphs Abs 1.3 1.0 - 3.6 K/uL   Monocytes Relative 4 %   Monocytes Absolute 0.3 0.2 - 0.9 K/uL    Eosinophils Relative 8 %   Eosinophils Absolute 0.6 0 - 0.7 K/uL   Basophils Relative 3 %   Basophils Absolute 0.2 (H) 0 - 0.1 K/uL  Basic metabolic panel     Status: Abnormal   Collection Time: 06/09/16  2:36 AM  Result Value Ref Range   Sodium 143 135 - 145 mmol/L   Potassium 4.8 3.5 - 5.1 mmol/L   Chloride 102 101 - 111 mmol/L   CO2 29 22 - 32 mmol/L   Glucose, Bld 108 (H) 65 - 99 mg/dL   BUN 76 (H) 6 - 20 mg/dL   Creatinine, Ser 11.95 (H) 0.44 - 1.00 mg/dL   Calcium 8.2 (L) 8.9 - 10.3 mg/dL   GFR calc non Af Amer 3 (L) >60 mL/min   GFR calc Af Amer 4 (L) >60 mL/min    Comment: (NOTE) The eGFR has been calculated using the CKD EPI equation. This calculation has not been validated in all clinical situations. eGFR's persistently <60 mL/min signify possible Chronic Kidney Disease.    Anion gap 12 5 - 15  Glucose, capillary     Status: Abnormal   Collection Time: 06/09/16  6:10 AM  Result Value Ref Range   Glucose-Capillary 250 (H) 65 - 99 mg/dL  Blood gas, arterial     Status: Abnormal   Collection Time: 06/09/16  6:16 AM  Result Value Ref Range   FIO2 0.50    Delivery systems VENTILATOR    Mode PRESSURE REGULATED VOLUME CONTROL    VT 450 mL   Peep/cpap 5.0 cm H20   pH, Arterial 7.23 (L) 7.350 - 7.450   pCO2 arterial 67 (HH) 32.0 - 48.0 mmHg    Comment: CRITICAL RESULT CALLED TO, READ BACK BY AND VERIFIED WITH:  Marda Stalker, NP 06/09/2016 0628 KRW    pO2, Arterial 84 83.0 - 108.0 mmHg   Bicarbonate 28.1 (H) 20.0 - 28.0 mmol/L   Acid-base deficit 0.6 0.0 - 2.0 mmol/L   O2 Saturation 94.1 %   Patient temperature 37.0    Collection site LEFT BRACHIAL    Sample type ARTERIAL DRAW    Allens test (pass/fail) PASS PASS   Mechanical Rate 18   Procalcitonin - Baseline     Status: None   Collection Time: 06/09/16  6:24 AM  Result Value Ref Range   Procalcitonin 0.83 ng/mL    Comment:        Interpretation: PCT > 0.5 ng/mL and <= 2 ng/mL: Systemic infection (sepsis)  is possible, but other conditions are known to elevate PCT as well. (NOTE)         ICU PCT Algorithm               Non ICU PCT Algorithm    ----------------------------     ------------------------------         PCT < 0.25 ng/mL                 PCT < 0.1 ng/mL     Stopping of antibiotics            Stopping of antibiotics       strongly encouraged.               strongly encouraged.    ----------------------------     ------------------------------       PCT level decrease by               PCT < 0.25 ng/mL       >= 80% from peak PCT       OR PCT 0.25 - 0.5 ng/mL          Stopping of antibiotics                                             encouraged.     Stopping of antibiotics           encouraged.    ----------------------------     ------------------------------       PCT level decrease by              PCT >= 0.25 ng/mL       < 80% from peak PCT        AND PCT >= 0.5 ng/mL             Continuing antibiotics                                              encouraged.       Continuing antibiotics            encouraged.    ----------------------------     ------------------------------     PCT level increase compared          PCT > 0.5 ng/mL         with peak PCT AND          PCT >= 0.5 ng/mL             Escalation of antibiotics                                          strongly encouraged.      Escalation of antibiotics        strongly encouraged.   Triglycerides     Status: None   Collection Time: 06/09/16  6:24 AM  Result Value Ref Range   Triglycerides 125 <150 mg/dL  Magnesium  Status: None   Collection Time: 06/09/16  6:24 AM  Result Value Ref Range   Magnesium 2.2 1.7 - 2.4 mg/dL  Phosphorus     Status: Abnormal   Collection Time: 06/09/16  6:24 AM  Result Value Ref Range   Phosphorus 7.2 (H) 2.5 - 4.6 mg/dL  Troponin I     Status: Abnormal   Collection Time: 06/09/16  6:24 AM  Result Value Ref Range   Troponin I 0.04 (HH) <0.03 ng/mL    Comment: CRITICAL VALUE  NOTED. VALUE IS CONSISTENT WITH PREVIOUSLY REPORTED/CALLED VALUE SNJ  Blood gas, arterial     Status: Abnormal   Collection Time: 06/09/16  7:40 AM  Result Value Ref Range   FIO2 0.50    Delivery systems VENTILATOR    Mode PRESSURE REGULATED VOLUME CONTROL    VT 550 mL   LHR 18 resp/min   Peep/cpap 5.0 cm H20   pH, Arterial 7.46 (H) 7.350 - 7.450   pCO2 arterial 39 32.0 - 48.0 mmHg   pO2, Arterial 127 (H) 83.0 - 108.0 mmHg   Bicarbonate 27.7 20.0 - 28.0 mmol/L   Acid-Base Excess 3.6 (H) 0.0 - 2.0 mmol/L   O2 Saturation 99.0 %   Patient temperature 37.0    Collection site LEFT RADIAL    Sample type ARTERIAL DRAW    Allens test (pass/fail) PASS PASS  Glucose, capillary     Status: Abnormal   Collection Time: 06/09/16  7:50 AM  Result Value Ref Range   Glucose-Capillary 136 (H) 65 - 99 mg/dL  Hepatitis B surface antigen     Status: None   Collection Time: 06/09/16  9:45 AM  Result Value Ref Range   Hepatitis B Surface Ag Negative Negative    Comment: (NOTE) Performed At: [facility] [place], [place] [id number] Lindon Romp MD [id number]   Hepatitis B surface antibody     Status: None   Collection Time: 06/09/16  9:45 AM  Result Value Ref Range   Hepatitis B-Post >1,000.0 Immunity>9.9 mIU/mL    Comment: (NOTE)  Status of Immunity                     Anti-HBs Level  ------------------                     -------------- Inconsistent with Immunity                   0.0 - 9.9 Consistent with Immunity                          >9.9 Performed At: [facility] [place], [place] [id number] Lindon Romp MD PI:[id number]   Glucose, capillary     Status: None   Collection Time: 06/09/16 11:03 AM  Result Value Ref Range   Glucose-Capillary 80 65 - 99 mg/dL  Glucose, capillary     Status: None   Collection Time: 06/09/16  4:51 PM  Result Value Ref Range   Glucose-Capillary 88 65 - 99 mg/dL  Glucose, capillary     Status:  Abnormal   Collection Time: 06/09/16  8:51 PM  Result Value Ref Range   Glucose-Capillary 117 (H) 65 - 99 mg/dL  Glucose, capillary     Status: Abnormal   Collection Time: 06/10/16 12:19 AM  Result Value Ref Range   Glucose-Capillary 139 (H) 65 - 99 mg/dL  Glucose, capillary  Status: None   Collection Time: 06/10/16  4:48 AM  Result Value Ref Range   Glucose-Capillary 86 65 - 99 mg/dL  Troponin I     Status: Abnormal   Collection Time: 06/10/16  5:44 AM  Result Value Ref Range   Troponin I 0.09 (HH) <0.03 ng/mL    Comment: CRITICAL VALUE NOTED. VALUE IS CONSISTENT WITH PREVIOUSLY REPORTED/CALLED VALUE TLB   Procalcitonin     Status: None   Collection Time: 06/10/16  5:44 AM  Result Value Ref Range   Procalcitonin 32.54 ng/mL    Comment:        Interpretation: PCT >= 10 ng/mL: Important systemic inflammatory response, almost exclusively due to severe bacterial sepsis or septic shock. (NOTE)         ICU PCT Algorithm               Non ICU PCT Algorithm    ----------------------------     ------------------------------         PCT < 0.25 ng/mL                 PCT < 0.1 ng/mL     Stopping of antibiotics            Stopping of antibiotics       strongly encouraged.               strongly encouraged.    ----------------------------     ------------------------------       PCT level decrease by               PCT < 0.25 ng/mL       >= 80% from peak PCT       OR PCT 0.25 - 0.5 ng/mL          Stopping of antibiotics                                             encouraged.     Stopping of antibiotics           encouraged.    ----------------------------     ------------------------------       PCT level decrease by              PCT >= 0.25 ng/mL       < 80% from peak PCT        AND PCT >= 0.5 ng/mL             Continuing antibiotics                                              encouraged.       Continuing antibiotics            encouraged.    ----------------------------      ------------------------------     PCT level increase compared          PCT > 0.5 ng/mL         with peak PCT AND          PCT >= 0.5 ng/mL             Escalation of antibiotics  strongly encouraged.      Escalation of antibiotics        strongly encouraged.   CBC with Differential/Platelet     Status: Abnormal   Collection Time: 06/10/16  5:44 AM  Result Value Ref Range   WBC 7.7 3.6 - 11.0 K/uL   RBC 3.59 (L) 3.80 - 5.20 MIL/uL   Hemoglobin 10.1 (L) 12.0 - 16.0 g/dL   HCT 31.8 (L) 35.0 - 47.0 %   MCV 88.5 80.0 - 100.0 fL   MCH 28.1 26.0 - 34.0 pg   MCHC 31.7 (L) 32.0 - 36.0 g/dL   RDW 23.1 (H) 11.5 - 14.5 %   Platelets 271 150 - 440 K/uL   Neutrophils Relative % 69 %   Neutro Abs 5.3 1.4 - 6.5 K/uL   Lymphocytes Relative 14 %   Lymphs Abs 1.1 1.0 - 3.6 K/uL   Monocytes Relative 10 %   Monocytes Absolute 0.8 0.2 - 0.9 K/uL   Eosinophils Relative 6 %   Eosinophils Absolute 0.5 0 - 0.7 K/uL   Basophils Relative 1 %   Basophils Absolute 0.1 0 - 0.1 K/uL  Basic metabolic panel     Status: Abnormal   Collection Time: 06/10/16  5:44 AM  Result Value Ref Range   Sodium 140 135 - 145 mmol/L   Potassium 4.3 3.5 - 5.1 mmol/L   Chloride 96 (L) 101 - 111 mmol/L   CO2 32 22 - 32 mmol/L   Glucose, Bld 110 (H) 65 - 99 mg/dL   BUN 41 (H) 6 - 20 mg/dL   Creatinine, Ser 8.70 (H) 0.44 - 1.00 mg/dL   Calcium 8.3 (L) 8.9 - 10.3 mg/dL   GFR calc non Af Amer 4 (L) >60 mL/min   GFR calc Af Amer 5 (L) >60 mL/min    Comment: (NOTE) The eGFR has been calculated using the CKD EPI equation. This calculation has not been validated in all clinical situations. eGFR's persistently <60 mL/min signify possible Chronic Kidney Disease.    Anion gap 12 5 - 15  Glucose, capillary     Status: Abnormal   Collection Time: 06/10/16  7:38 AM  Result Value Ref Range   Glucose-Capillary 119 (H) 65 - 99 mg/dL  Glucose, capillary     Status: Abnormal   Collection Time:  06/10/16 11:58 AM  Result Value Ref Range   Glucose-Capillary 124 (H) 65 - 99 mg/dL  Comprehensive metabolic panel     Status: Abnormal   Collection Time: 07/04/16  4:25 AM  Result Value Ref Range   Sodium 139 135 - 145 mmol/L   Potassium 5.4 (H) 3.5 - 5.1 mmol/L   Chloride 100 (L) 101 - 111 mmol/L   CO2 23 22 - 32 mmol/L   Glucose, Bld 100 (H) 65 - 99 mg/dL   BUN 82 (H) 6 - 20 mg/dL   Creatinine, Ser 13.06 (H) 0.44 - 1.00 mg/dL   Calcium 8.0 (L) 8.9 - 10.3 mg/dL   Total Protein 8.6 (H) 6.5 - 8.1 g/dL   Albumin 4.0 3.5 - 5.0 g/dL   AST 22 15 - 41 U/L   ALT 15 14 - 54 U/L   Alkaline Phosphatase 77 38 - 126 U/L   Total Bilirubin 0.7 0.3 - 1.2 mg/dL   GFR calc non Af Amer 3 (L) >60 mL/min   GFR calc Af Amer 3 (L) >60 mL/min    Comment: (NOTE) The eGFR has been calculated using the CKD EPI equation. This  calculation has not been validated in all clinical situations. eGFR's persistently <60 mL/min signify possible Chronic Kidney Disease.    Anion gap 16 (H) 5 - 15  Brain natriuretic peptide     Status: Abnormal   Collection Time: 07/04/16  4:25 AM  Result Value Ref Range   B Natriuretic Peptide 1,519.0 (H) 0.0 - 100.0 pg/mL  Troponin I     Status: Abnormal   Collection Time: 07/04/16  4:25 AM  Result Value Ref Range   Troponin I 0.03 (HH) <0.03 ng/mL    Comment: CRITICAL RESULT CALLED TO, READ BACK BY AND VERIFIED WITH APRIL BRUMGARD AT 0453 ON 07/04/16 BY SNJ   CBC with Differential     Status: Abnormal   Collection Time: 07/04/16  4:25 AM  Result Value Ref Range   WBC 7.6 3.6 - 11.0 K/uL   RBC 4.00 3.80 - 5.20 MIL/uL   Hemoglobin 11.9 (L) 12.0 - 16.0 g/dL   HCT 44.9 67.5 - 91.6 %   MCV 89.5 80.0 - 100.0 fL   MCH 29.7 26.0 - 34.0 pg   MCHC 33.2 32.0 - 36.0 g/dL   RDW 38.4 (H) 66.5 - 99.3 %   Platelets 264 150 - 440 K/uL   Neutrophils Relative % 56 %   Neutro Abs 4.1 1.4 - 6.5 K/uL   Lymphocytes Relative 28 %   Lymphs Abs 2.1 1.0 - 3.6 K/uL   Monocytes Relative 7 %     Monocytes Absolute 0.6 0.2 - 0.9 K/uL   Eosinophils Relative 8 %   Eosinophils Absolute 0.6 0 - 0.7 K/uL   Basophils Relative 1 %   Basophils Absolute 0.1 0 - 0.1 K/uL  Protime-INR     Status: None   Collection Time: 07/04/16  4:25 AM  Result Value Ref Range   Prothrombin Time 13.7 11.4 - 15.2 seconds   INR 1.05   Glucose, capillary     Status: Abnormal   Collection Time: 07/04/16  5:58 AM  Result Value Ref Range   Glucose-Capillary 101 (H) 65 - 99 mg/dL  MRSA PCR Screening     Status: None   Collection Time: 07/04/16  6:15 AM  Result Value Ref Range   MRSA by PCR NEGATIVE NEGATIVE    Comment:        The GeneXpert MRSA Assay (FDA approved for NASAL specimens only), is one component of a comprehensive MRSA colonization surveillance program. It is not intended to diagnose MRSA infection nor to guide or monitor treatment for MRSA infections.   CK     Status: None   Collection Time: 07/04/16  8:47 AM  Result Value Ref Range   Total CK 219 38 - 234 U/L    Comment: HEMOLYSIS AT THIS LEVEL MAY AFFECT RESULT  Troponin I     Status: Abnormal   Collection Time: 07/04/16  8:47 AM  Result Value Ref Range   Troponin I 0.03 (HH) <0.03 ng/mL    Comment: CRITICAL VALUE NOTED. VALUE IS CONSISTENT WITH PREVIOUSLY REPORTED/CALLED VALUE DAS  Glucose, capillary     Status: None   Collection Time: 07/04/16  9:59 AM  Result Value Ref Range   Glucose-Capillary 94 65 - 99 mg/dL  Glucose, capillary     Status: None   Collection Time: 07/04/16 12:19 PM  Result Value Ref Range   Glucose-Capillary 92 65 - 99 mg/dL  Troponin I     Status: Abnormal   Collection Time: 07/04/16  1:11 PM  Result Value  Ref Range   Troponin I 0.03 (HH) <0.03 ng/mL    Comment: CRITICAL VALUE NOTED. VALUE IS CONSISTENT WITH PREVIOUSLY REPORTED/CALLED VALUE. [facility].  Basic metabolic panel     Status: Abnormal   Collection Time: 07/04/16  1:11 PM  Result Value Ref Range   Sodium 138 135 - 145 mmol/L   Potassium 3.6  3.5 - 5.1 mmol/L   Chloride 96 (L) 101 - 111 mmol/L   CO2 30 22 - 32 mmol/L   Glucose, Bld 92 65 - 99 mg/dL   BUN 23 (H) 6 - 20 mg/dL   Creatinine, Ser 4.70 (H) 0.44 - 1.00 mg/dL   Calcium 8.7 (L) 8.9 - 10.3 mg/dL   GFR calc non Af Amer 9 (L) >60 mL/min   GFR calc Af Amer 11 (L) >60 mL/min    Comment: (NOTE) The eGFR has been calculated using the CKD EPI equation. This calculation has not been validated in all clinical situations. eGFR's persistently <60 mL/min signify possible Chronic Kidney Disease.    Anion gap 12 5 - 15  Magnesium     Status: None   Collection Time: 07/04/16  1:11 PM  Result Value Ref Range   Magnesium 2.0 1.7 - 2.4 mg/dL  Phosphorus     Status: None   Collection Time: 07/04/16  1:11 PM  Result Value Ref Range   Phosphorus 3.0 2.5 - 4.6 mg/dL  Glucose, capillary     Status: None   Collection Time: 07/04/16  4:14 PM  Result Value Ref Range   Glucose-Capillary 92 65 - 99 mg/dL  Troponin I     Status: Abnormal   Collection Time: 07/04/16  7:30 PM  Result Value Ref Range   Troponin I 0.03 (HH) <0.03 ng/mL    Comment: CRITICAL VALUE NOTED. VALUE IS CONSISTENT WITH PREVIOUSLY REPORTED/CALLED VALUE SNJ  Glucose, capillary     Status: Abnormal   Collection Time: 07/04/16  7:44 PM  Result Value Ref Range   Glucose-Capillary 139 (H) 65 - 99 mg/dL   Comment 1 Notify RN   Glucose, capillary     Status: Abnormal   Collection Time: 07/04/16 11:30 PM  Result Value Ref Range   Glucose-Capillary 121 (H) 65 - 99 mg/dL   Comment 1 Notify RN   Glucose, capillary     Status: Abnormal   Collection Time: 07/05/16  3:30 AM  Result Value Ref Range   Glucose-Capillary 113 (H) 65 - 99 mg/dL   Comment 1 Notify RN   Glucose, capillary     Status: None   Collection Time: 07/05/16  7:17 AM  Result Value Ref Range   Glucose-Capillary 96 65 - 99 mg/dL  Glucose, capillary     Status: Abnormal   Collection Time: 07/05/16 11:19 AM  Result Value Ref Range   Glucose-Capillary  131 (H) 65 - 99 mg/dL  Glucose, capillary     Status: None   Collection Time: 07/05/16  4:48 PM  Result Value Ref Range   Glucose-Capillary 97 65 - 99 mg/dL   Comment 1 Notify RN   Glucose, capillary     Status: None   Collection Time: 07/05/16  7:54 PM  Result Value Ref Range   Glucose-Capillary 99 65 - 99 mg/dL  Glucose, capillary     Status: Abnormal   Collection Time: 07/06/16 12:09 AM  Result Value Ref Range   Glucose-Capillary 104 (H) 65 - 99 mg/dL  Glucose, capillary     Status: None   Collection Time:  07/06/16  4:20 AM  Result Value Ref Range   Glucose-Capillary 89 65 - 99 mg/dL  CBC     Status: Abnormal   Collection Time: 07/06/16  4:58 AM  Result Value Ref Range   WBC 5.7 3.6 - 11.0 K/uL   RBC 3.69 (L) 3.80 - 5.20 MIL/uL   Hemoglobin 10.8 (L) 12.0 - 16.0 g/dL   HCT 76.1 (L) 60.7 - 60.6 %   MCV 89.6 80.0 - 100.0 fL   MCH 29.2 26.0 - 34.0 pg   MCHC 32.6 32.0 - 36.0 g/dL   RDW 67.8 (H) 55.4 - 76.8 %   Platelets 144 (L) 150 - 440 K/uL  Basic metabolic panel     Status: Abnormal   Collection Time: 07/06/16  4:58 AM  Result Value Ref Range   Sodium 137 135 - 145 mmol/L   Potassium 5.5 (H) 3.5 - 5.1 mmol/L   Chloride 99 (L) 101 - 111 mmol/L   CO2 27 22 - 32 mmol/L   Glucose, Bld 88 65 - 99 mg/dL   BUN 62 (H) 6 - 20 mg/dL   Creatinine, Ser 9.15 (H) 0.44 - 1.00 mg/dL   Calcium 7.6 (L) 8.9 - 10.3 mg/dL   GFR calc non Af Amer 4 (L) >60 mL/min   GFR calc Af Amer 5 (L) >60 mL/min    Comment: (NOTE) The eGFR has been calculated using the CKD EPI equation. This calculation has not been validated in all clinical situations. eGFR's persistently <60 mL/min signify possible Chronic Kidney Disease.    Anion gap 11 5 - 15  Glucose, capillary     Status: None   Collection Time: 07/06/16  7:48 AM  Result Value Ref Range   Glucose-Capillary 87 65 - 99 mg/dL   Comment 1 Notify RN   Glucose, capillary     Status: None   Collection Time: 07/06/16 11:26 AM  Result Value Ref  Range   Glucose-Capillary 94 65 - 99 mg/dL  Glucose, capillary     Status: Abnormal   Collection Time: 07/06/16  4:25 PM  Result Value Ref Range   Glucose-Capillary 112 (H) 65 - 99 mg/dL  Glucose, capillary     Status: None   Collection Time: 07/06/16  7:54 PM  Result Value Ref Range   Glucose-Capillary 89 65 - 99 mg/dL  Glucose, capillary     Status: Abnormal   Collection Time: 07/06/16 11:54 PM  Result Value Ref Range   Glucose-Capillary 111 (H) 65 - 99 mg/dL   Comment 1 Notify RN   Glucose, capillary     Status: Abnormal   Collection Time: 07/07/16  4:16 AM  Result Value Ref Range   Glucose-Capillary 116 (H) 65 - 99 mg/dL   Comment 1 Notify RN   Glucose, capillary     Status: None   Collection Time: 07/07/16  7:29 AM  Result Value Ref Range   Glucose-Capillary 92 65 - 99 mg/dL   Comment 1 Notify RN   Phosphorus     Status: Abnormal   Collection Time: 07/07/16 10:10 AM  Result Value Ref Range   Phosphorus 8.4 (H) 2.5 - 4.6 mg/dL  Glucose, capillary     Status: None   Collection Time: 07/07/16 12:17 PM  Result Value Ref Range   Glucose-Capillary 87 65 - 99 mg/dL  Glucose, capillary     Status: None   Collection Time: 07/07/16  1:31 PM  Result Value Ref Range   Glucose-Capillary 81 65 - 99  mg/dL   Comment 1 Notify RN   CBC with Differential/Platelet     Status: Abnormal   Collection Time: 07/21/16 10:46 AM  Result Value Ref Range   WBC 7.5 3.6 - 11.0 K/uL   RBC 3.89 3.80 - 5.20 MIL/uL   Hemoglobin 11.3 (L) 12.0 - 16.0 g/dL   HCT 34.4 (L) 35.0 - 47.0 %   MCV 88.5 80.0 - 100.0 fL   MCH 29.1 26.0 - 34.0 pg   MCHC 32.9 32.0 - 36.0 g/dL   RDW 21.4 (H) 11.5 - 14.5 %   Platelets 280 150 - 440 K/uL   Neutrophils Relative % 60 %   Neutro Abs 4.4 1.4 - 6.5 K/uL   Lymphocytes Relative 21 %   Lymphs Abs 1.6 1.0 - 3.6 K/uL   Monocytes Relative 8 %   Monocytes Absolute 0.6 0.2 - 0.9 K/uL   Eosinophils Relative 10 %   Eosinophils Absolute 0.7 0 - 0.7 K/uL   Basophils  Relative 1 %   Basophils Absolute 0.1 0 - 0.1 K/uL  Basic metabolic panel     Status: Abnormal   Collection Time: 07/21/16 10:46 AM  Result Value Ref Range   Sodium 139 135 - 145 mmol/L   Potassium 4.0 3.5 - 5.1 mmol/L   Chloride 98 (L) 101 - 111 mmol/L   CO2 30 22 - 32 mmol/L   Glucose, Bld 76 65 - 99 mg/dL   BUN 40 (H) 6 - 20 mg/dL   Creatinine, Ser 7.73 (H) 0.44 - 1.00 mg/dL   Calcium 8.1 (L) 8.9 - 10.3 mg/dL   GFR calc non Af Amer 5 (L) >60 mL/min   GFR calc Af Amer 6 (L) >60 mL/min    Comment: (NOTE) The eGFR has been calculated using the CKD EPI equation. This calculation has not been validated in all clinical situations. eGFR's persistently <60 mL/min signify possible Chronic Kidney Disease.    Anion gap 11 5 - 15  Troponin I     Status: Abnormal   Collection Time: 07/21/16 10:46 AM  Result Value Ref Range   Troponin I 0.03 (HH) <0.03 ng/mL    Comment: CRITICAL RESULT CALLED TO, READ BACK BY AND VERIFIED WITH GRACE CINDRIC AT 1143 ON 07/21/16 JLJ     Assessment/Plan:  CHF (congestive heart failure) (HCC) No recent symptoms.  Sees cardiology  Essential hypertension blood pressure control important in reducing the progression of atherosclerotic disease. On appropriate oral medications.   Hyperlipidemia lipid control important in reducing the progression of atherosclerotic disease. 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 It is not clear to me if this is her arterial or venous access site, but this is quite large and will need to be addressed. I'll offer the patient 2 options 1 being a covered stent to exclude the aneurysm and the other being a surgical revision with a new jump graft. The second option with the jump graft would require a dialysis catheter for several weeks. She would prefer the covered stent and we will try to get that scheduled in the very near future.      Leotis Pain 08/04/2016, 9:59 AM   This note was created with Dragon medical transcription system.  Any errors from dictation are unintentional.

## 2016-08-04 NOTE — Assessment & Plan Note (Signed)
lipid control important in reducing the progression of atherosclerotic disease. Continue statin therapy  

## 2016-08-05 ENCOUNTER — Emergency Department
Admission: EM | Admit: 2016-08-05 | Discharge: 2016-08-05 | Disposition: A | Discharge status: Home / Self Care | Payer: Medicare Other | Source: Home / Self Care

## 2016-08-05 ENCOUNTER — Encounter: Payer: Self-pay | Admitting: Vascular Surgery

## 2016-08-05 DIAGNOSIS — Z79899 Other long term (current) drug therapy: Secondary | ICD-10-CM

## 2016-08-05 DIAGNOSIS — E1122 Type 2 diabetes mellitus with diabetic chronic kidney disease: Secondary | ICD-10-CM

## 2016-08-05 DIAGNOSIS — T82858A Stenosis of vascular prosthetic devices, implants and grafts, initial encounter: Secondary | ICD-10-CM | POA: Diagnosis not present

## 2016-08-05 DIAGNOSIS — Z5321 Procedure and treatment not carried out due to patient leaving prior to being seen by health care provider: Secondary | ICD-10-CM

## 2016-08-05 DIAGNOSIS — Z992 Dependence on renal dialysis: Secondary | ICD-10-CM

## 2016-08-05 DIAGNOSIS — R51 Headache: Secondary | ICD-10-CM

## 2016-08-05 DIAGNOSIS — J449 Chronic obstructive pulmonary disease, unspecified: Secondary | ICD-10-CM

## 2016-08-05 DIAGNOSIS — Z7982 Long term (current) use of aspirin: Secondary | ICD-10-CM | POA: Insufficient documentation

## 2016-08-05 DIAGNOSIS — Z87891 Personal history of nicotine dependence: Secondary | ICD-10-CM | POA: Insufficient documentation

## 2016-08-05 DIAGNOSIS — I509 Heart failure, unspecified: Secondary | ICD-10-CM | POA: Insufficient documentation

## 2016-08-05 DIAGNOSIS — N186 End stage renal disease: Secondary | ICD-10-CM

## 2016-08-05 DIAGNOSIS — J45909 Unspecified asthma, uncomplicated: Secondary | ICD-10-CM

## 2016-08-05 DIAGNOSIS — I132 Hypertensive heart and chronic kidney disease with heart failure and with stage 5 chronic kidney disease, or end stage renal disease: Secondary | ICD-10-CM | POA: Insufficient documentation

## 2016-08-05 HISTORY — DX: Heart failure, unspecified: I50.9

## 2016-08-05 MED ORDER — ONDANSETRON 4 MG PO TBDP
4.0000 mg | ORAL_TABLET | Freq: Once | ORAL | Status: AC
  Administered 2016-08-05: 4 mg via ORAL
  Filled 2016-08-05: qty 1

## 2016-08-05 NOTE — ED Notes (Signed)
No answer when called and pt not noted reentering ED lobby

## 2016-08-05 NOTE — ED Triage Notes (Signed)
Pt presents to ED with c/o headache and high blood pressure accompanied by nasuea. Pt had dialysis tonight. Pt has hx of migraines, CHF, COPD. Pt states is 2 L nasal cannula. Pt reports given 2 Klonopin at dialysis.

## 2016-08-05 NOTE — ED Notes (Signed)
Pt noted leaving ED lobby with SO

## 2016-08-06 ENCOUNTER — Telehealth: Payer: Self-pay | Admitting: Emergency Medicine

## 2016-08-06 DIAGNOSIS — J449 Chronic obstructive pulmonary disease, unspecified: Secondary | ICD-10-CM | POA: Insufficient documentation

## 2016-08-06 DIAGNOSIS — J441 Chronic obstructive pulmonary disease with (acute) exacerbation: Secondary | ICD-10-CM | POA: Insufficient documentation

## 2016-08-06 DIAGNOSIS — I169 Hypertensive crisis, unspecified: Secondary | ICD-10-CM | POA: Insufficient documentation

## 2016-08-06 NOTE — Telephone Encounter (Signed)
Called patient due to lwot to inquire about condition and follow up plans. Left message.   

## 2016-08-18 ENCOUNTER — Encounter: Payer: Self-pay | Admitting: Emergency Medicine

## 2016-08-18 ENCOUNTER — Emergency Department
Admission: EM | Admit: 2016-08-18 | Discharge: 2016-08-18 | Disposition: A | Discharge status: Home / Self Care | Payer: Medicare Other | Attending: Emergency Medicine | Admitting: Emergency Medicine

## 2016-08-18 ENCOUNTER — Emergency Department: Payer: Medicare Other

## 2016-08-18 DIAGNOSIS — Z79899 Other long term (current) drug therapy: Secondary | ICD-10-CM | POA: Insufficient documentation

## 2016-08-18 DIAGNOSIS — Z992 Dependence on renal dialysis: Secondary | ICD-10-CM | POA: Insufficient documentation

## 2016-08-18 DIAGNOSIS — J45909 Unspecified asthma, uncomplicated: Secondary | ICD-10-CM | POA: Diagnosis not present

## 2016-08-18 DIAGNOSIS — E1122 Type 2 diabetes mellitus with diabetic chronic kidney disease: Secondary | ICD-10-CM | POA: Diagnosis not present

## 2016-08-18 DIAGNOSIS — N186 End stage renal disease: Secondary | ICD-10-CM

## 2016-08-18 DIAGNOSIS — Z7982 Long term (current) use of aspirin: Secondary | ICD-10-CM | POA: Insufficient documentation

## 2016-08-18 DIAGNOSIS — I132 Hypertensive heart and chronic kidney disease with heart failure and with stage 5 chronic kidney disease, or end stage renal disease: Secondary | ICD-10-CM | POA: Insufficient documentation

## 2016-08-18 DIAGNOSIS — Z87891 Personal history of nicotine dependence: Secondary | ICD-10-CM | POA: Diagnosis not present

## 2016-08-18 DIAGNOSIS — J449 Chronic obstructive pulmonary disease, unspecified: Secondary | ICD-10-CM | POA: Diagnosis not present

## 2016-08-18 DIAGNOSIS — I10 Essential (primary) hypertension: Secondary | ICD-10-CM

## 2016-08-18 DIAGNOSIS — R519 Headache, unspecified: Secondary | ICD-10-CM

## 2016-08-18 DIAGNOSIS — I5032 Chronic diastolic (congestive) heart failure: Secondary | ICD-10-CM | POA: Insufficient documentation

## 2016-08-18 DIAGNOSIS — R51 Headache: Secondary | ICD-10-CM | POA: Insufficient documentation

## 2016-08-18 LAB — COMPREHENSIVE METABOLIC PANEL
ALT: 8 U/L — ABNORMAL LOW (ref 14–54)
AST: 27 U/L (ref 15–41)
Albumin: 4 g/dL (ref 3.5–5.0)
Alkaline Phosphatase: 73 U/L (ref 38–126)
Anion gap: 11 (ref 5–15)
BUN: 35 mg/dL — ABNORMAL HIGH (ref 6–20)
CHLORIDE: 100 mmol/L — AB (ref 101–111)
CO2: 29 mmol/L (ref 22–32)
Calcium: 8.7 mg/dL — ABNORMAL LOW (ref 8.9–10.3)
Creatinine, Ser: 8.32 mg/dL — ABNORMAL HIGH (ref 0.44–1.00)
GFR, EST AFRICAN AMERICAN: 5 mL/min — AB (ref >60)
GFR, EST NON AFRICAN AMERICAN: 5 mL/min — AB (ref >60)
Glucose, Bld: 100 mg/dL — ABNORMAL HIGH (ref 65–99)
POTASSIUM: 4.5 mmol/L (ref 3.5–5.1)
Sodium: 140 mmol/L (ref 135–145)
Total Bilirubin: 1 mg/dL (ref 0.3–1.2)
Total Protein: 8.1 g/dL (ref 6.5–8.1)

## 2016-08-18 LAB — CBC WITH DIFFERENTIAL/PLATELET
Basophils Absolute: 0.1 10*3/uL (ref 0–0.1)
Basophils Relative: 1 %
EOS ABS: 0.6 10*3/uL (ref 0–0.7)
EOS PCT: 9 %
HCT: 34.6 % — ABNORMAL LOW (ref 35.0–47.0)
Hemoglobin: 11.4 g/dL — ABNORMAL LOW (ref 12.0–16.0)
LYMPHS ABS: 1.2 10*3/uL (ref 1.0–3.6)
LYMPHS PCT: 20 %
MCH: 29.1 pg (ref 26.0–34.0)
MCHC: 33 g/dL (ref 32.0–36.0)
MCV: 88.3 fL (ref 80.0–100.0)
MONO ABS: 0.7 10*3/uL (ref 0.2–0.9)
Monocytes Relative: 11 %
Neutro Abs: 3.6 10*3/uL (ref 1.4–6.5)
Neutrophils Relative %: 59 %
PLATELETS: 332 10*3/uL (ref 150–440)
RBC: 3.92 MIL/uL (ref 3.80–5.20)
RDW: 22.1 % — AB (ref 11.5–14.5)
WBC: 6.1 10*3/uL (ref 3.6–11.0)

## 2016-08-18 LAB — TROPONIN I
TROPONIN I: 0.03 ng/mL — AB (ref <0.03)
Troponin I: 0.04 ng/mL (ref <0.03)

## 2016-08-18 MED ORDER — CLONIDINE HCL 0.1 MG PO TABS
0.1000 mg | ORAL_TABLET | Freq: Once | ORAL | Status: AC
  Administered 2016-08-18: 0.1 mg via ORAL
  Filled 2016-08-18: qty 1

## 2016-08-18 MED ORDER — PROMETHAZINE HCL 25 MG/ML IJ SOLN
25.0000 mg | Freq: Once | INTRAMUSCULAR | Status: AC
  Administered 2016-08-18: 25 mg via INTRAVENOUS
  Filled 2016-08-18: qty 1

## 2016-08-18 MED ORDER — ONDANSETRON HCL 4 MG/2ML IJ SOLN
4.0000 mg | Freq: Once | INTRAMUSCULAR | Status: AC
  Administered 2016-08-18: 4 mg via INTRAVENOUS
  Filled 2016-08-18: qty 2

## 2016-08-18 MED ORDER — HYDRALAZINE HCL 20 MG/ML IJ SOLN
10.0000 mg | Freq: Once | INTRAMUSCULAR | Status: AC
  Administered 2016-08-18: 10 mg via INTRAVENOUS
  Filled 2016-08-18: qty 1

## 2016-08-18 MED ORDER — LORAZEPAM 2 MG/ML IJ SOLN
1.0000 mg | Freq: Once | INTRAMUSCULAR | Status: AC
  Administered 2016-08-18: 1 mg via INTRAVENOUS
  Filled 2016-08-18: qty 1

## 2016-08-18 MED ORDER — METOCLOPRAMIDE HCL 5 MG/ML IJ SOLN
10.0000 mg | Freq: Once | INTRAMUSCULAR | Status: AC
  Administered 2016-08-18: 10 mg via INTRAVENOUS
  Filled 2016-08-18: qty 2

## 2016-08-18 MED ORDER — HYDROMORPHONE HCL 1 MG/ML IJ SOLN
1.0000 mg | Freq: Once | INTRAMUSCULAR | Status: AC
  Administered 2016-08-18: 1 mg via INTRAVENOUS
  Filled 2016-08-18: qty 1

## 2016-08-18 MED ORDER — METOPROLOL TARTRATE 50 MG PO TABS
50.0000 mg | ORAL_TABLET | Freq: Once | ORAL | Status: AC
  Administered 2016-08-18: 50 mg via ORAL
  Filled 2016-08-18: qty 1

## 2016-08-18 MED ORDER — DIPHENHYDRAMINE HCL 50 MG/ML IJ SOLN
25.0000 mg | Freq: Once | INTRAMUSCULAR | Status: AC
  Administered 2016-08-18: 25 mg via INTRAVENOUS
  Filled 2016-08-18: qty 1

## 2016-08-18 NOTE — ED Notes (Signed)
Pt crying and moaning during triage.

## 2016-08-18 NOTE — ED Notes (Signed)
Pt to ct 

## 2016-08-18 NOTE — ED Provider Notes (Signed)
[facility] Provider Note   CSN: [id number] Arrival date & time: 08/18/16  6440     History   Chief Complaint Chief Complaint  Patient presents with  . Migraine    HPI Qamar Rosman is a 56 y.o. female hx of CHF, DM, heart failure, ESRD on HD (last HD today), chronic migraines, uncontrolled HTN here with headaches, hypertension. Patient states that she has a history of migraines and she has sudden onset of severe headaches that typical of her migraines this morning. This started about 30 minutes after she started dialysis this morning and she had to stop dialysis about 45 minutes short. Associated with her blurry vision but no vomiting. She states that this typical of her migraines and she gets this very often about once a week or so. Patient was seen here about a month ago with multiple complaints and had CT head that was negative. Has not seen a neurologist and is not on any preventive medicine.    The history is provided by the patient.    Past Medical History:  Diagnosis Date  . Asthma   . CHF (congestive heart failure) ([place])   . COPD (chronic obstructive pulmonary disease) ([place])   . Diabetes mellitus without complication ([place])   . Diastolic heart failure ([place])   . ESRD (end stage renal disease) on dialysis ([place])   . Hypertension   . Neuropathy ([place])   . Polycystic kidney disease   . Renal insufficiency     Patient Active Problem List   Diagnosis Date Noted  . Acute pulmonary edema ([place]) 07/04/2016  . Acute respiratory failure ([place]) 06/09/2016  . Atypical chest pain 06/08/2016  . Chronic pain syndrome 06/08/2016  . Anemia in stage 4 chronic kidney disease ([place]) 06/08/2016  . CHF exacerbation ([place]) 04/28/2016  . Epigastric pain 03/28/2016  . Acute CHF (congestive heart failure) ([place]) 03/28/2016  . CHF (congestive heart failure) ([place]) 03/28/2016  . Chronic diastolic heart failure ([place]) 02/04/2016  . Hoarseness of voice 02/04/2016  . Acute on chronic  diastolic CHF (congestive heart failure) ([place]) 12/22/2015  . ESRD on dialysis ([place]) 12/22/2015  . Essential hypertension 12/22/2015  . Hyperlipidemia 12/22/2015    Past Surgical History:  Procedure Laterality Date  . ARTERIOVENOUS GRAFT PLACEMENT Right    x3 (R forearm currently used for access)  . COLON SURGERY    . PERIPHERAL VASCULAR CATHETERIZATION Right 08/04/2016   Procedure: A/V Shuntogram;  Surgeon: Algernon Huxley, MD;  Location: [facility];  Service: Cardiovascular;  Laterality: Right;  . VEIN HARVEST      OB History    No data available       Home Medications    Prior to Admission medications   Medication Sig Start Date End Date Taking? Authorizing Provider  acetaminophen (TYLENOL) 325 MG tablet Take 2 tablets (650 mg total) by mouth every 6 (six) hours as needed for mild pain or headache. 03/18/16  Yes Nicholes Mango, MD  albuterol-ipratropium (COMBIVENT) 18-103 MCG/ACT inhaler Inhale 2 puffs into the lungs daily as needed for wheezing or shortness of breath.   Yes Historical Provider, MD  aspirin 81 MG chewable tablet Chew 1 tablet (81 mg total) by mouth daily. 01/17/16  Yes Gladstone Lighter, MD  b complex-vitamin c-folic acid (NEPHRO-VITE) 0.8 MG TABS tablet Take 1 tablet by mouth at bedtime.   Yes Historical Provider, MD  baclofen (LIORESAL) 10 MG tablet Take 5 mg by mouth 2 (two) times daily.   Yes Historical Provider,  MD  calcium acetate (PHOSLO) 667 MG capsule Take 3 capsules (2,001 mg total) by mouth 3 (three) times daily with meals. Patient taking differently: Take 667 mg by mouth 3 (three) times daily with meals.  07/07/16  Yes Fritzi Mandes, MD  cloNIDine (CATAPRES) 0.1 MG tablet Take 1 tablet (0.1 mg total) by mouth 2 (two) times daily. 01/17/16  Yes Gladstone Lighter, MD  cloNIDine (CATAPRES) 0.1 MG tablet Take 0.1 mg by mouth as needed. Prn diastolic >409 and/or systolic B/P >811   Yes Historical Provider, MD  diphenhydrAMINE (BENADRYL) 25 MG tablet Take 25 mg  by mouth as needed. Prn anaphylaxis   Yes Historical Provider, MD  Doxercalciferol (HECTOROL) 1 MCG CAPS Take 3.5 mcg by mouth 3 (three) times a week.   Yes Historical Provider, MD  epoetin alfa (EPOGEN) 3000 UNIT/ML injection Inject 6,000 Units into the vein 3 (three) times a week.   Yes Historical Provider, MD  furosemide (LASIX) 80 MG tablet Take 1 tablet (80 mg total) by mouth daily. 03/05/16 08/18/16 Yes Alisa Graff, FNP  gabapentin (NEURONTIN) 300 MG capsule Take 300 mg by mouth 2 (two) times daily.    Yes Historical Provider, MD  irbesartan (AVAPRO) 150 MG tablet Take 1 tablet by mouth daily. 05/20/16  Yes Historical Provider, MD  iron sucrose (VENOFER) 20 MG/ML injection Inject 50 mg into the vein once a week.   Yes Historical Provider, MD  metoprolol (LOPRESSOR) 50 MG tablet 50 mg 2 (two) times daily.  05/12/16  Yes Historical Provider, MD  nortriptyline (PAMELOR) 25 MG capsule Take 25 mg by mouth at bedtime.   Yes Historical Provider, MD  omeprazole (PRILOSEC) 40 MG capsule Take 40 mg by mouth daily.    Yes Historical Provider, MD  rosuvastatin (CRESTOR) 5 MG tablet Take 1 tablet (5 mg total) by mouth at bedtime. 07/07/16  Yes Fritzi Mandes, MD  SENSIPAR 30 MG tablet Take 1 tablet (30 mg total) by mouth daily. 07/07/16  Yes Fritzi Mandes, MD  LORazepam (ATIVAN) 1 MG tablet Take 1 tablet (1 mg total) by mouth 2 (two) times daily as needed for anxiety. Patient not taking: Reported on 08/18/2016 07/21/16   Daymon Larsen, MD  losartan (COZAAR) 25 MG tablet 25 mg daily.  05/12/16   Historical Provider, MD  metolazone (ZAROXOLYN) 5 MG tablet Take 1 tablet (5 mg total) by mouth daily as needed. Patient taking differently: Take 5 mg by mouth daily.  06/08/16 09/06/16  Minna Merritts, MD  ondansetron (ZOFRAN) 4 MG tablet Take 1 tablet (4 mg total) by mouth every 8 (eight) hours as needed for nausea or vomiting. Patient not taking: Reported on 08/18/2016 07/21/16   Daymon Larsen, MD  traMADol (ULTRAM) 50  MG tablet Take 1 tablet (50 mg total) by mouth every 6 (six) hours as needed. Patient not taking: Reported on 08/18/2016 07/21/16   Daymon Larsen, MD  VENTOLIN HFA 108 [phone] Base) MCG/ACT inhaler Inhale 2 puffs into the lungs daily as needed.  05/21/16   Historical Provider, MD    Family History Family History  Problem Relation Age of Onset  . Hypertension Brother   . Heart failure Brother   . Hypertension Mother   . Heart disease Mother     Social History Social History  Substance Use Topics  . Smoking status: Former Smoker    Packs/day: 0.25    Quit date: 08/11/2015  . Smokeless tobacco: Never Used  . Alcohol use No  Allergies   Morphine and related; Strawberry extract; Tramadol; Venofer [iron sucrose]; Vicodin [hydrocodone-acetaminophen]; Buprenorphine hcl; and Codeine   Review of Systems Review of Systems  Neurological: Positive for headaches.  All other systems reviewed and are negative.    Physical Exam Updated Vital Signs BP (!) 191/107   Pulse (!) 118   Temp 97.7 F (36.5 C) (Axillary)   Resp 19   Ht 5\' 3"  (1.6 m)   Wt 180 lb (81.6 kg)   SpO2 100%   BMI 31.89 kg/m   Physical Exam  Constitutional: She is oriented to person, place, and time.  Uncomfortable, holding her head   HENT:  Head: Normocephalic.  Eyes: EOM are normal. Pupils are equal, round, and reactive to light.  Neck: Normal range of motion. Neck supple.  No meningeal signs   Cardiovascular: Normal rate, regular rhythm and normal heart sounds.   Pulmonary/Chest: Effort normal and breath sounds normal. No respiratory distress. She has no wheezes. She has no rales.  Abdominal: Soft. Bowel sounds are normal. She exhibits no distension. There is no tenderness. There is no guarding.  Musculoskeletal: Normal range of motion.  Neurological: She is alert and oriented to person, place, and time.  CN 2-12 intact. Nl strength throughout.   Skin: Skin is warm.  Psychiatric: She has a normal mood  and affect.  Nursing note and vitals reviewed.    ED Treatments / Results  Labs (all labs ordered are listed, but only abnormal results are displayed) Labs Reviewed  CBC WITH DIFFERENTIAL/PLATELET - Abnormal; Notable for the following:       Result Value   Hemoglobin 11.4 (*)    HCT 34.6 (*)    RDW 22.1 (*)    All other components within normal limits  COMPREHENSIVE METABOLIC PANEL - Abnormal; Notable for the following:    Chloride 100 (*)    Glucose, Bld 100 (*)    BUN 35 (*)    Creatinine, Ser 8.32 (*)    Calcium 8.7 (*)    ALT 8 (*)    GFR calc non Af Amer 5 (*)    GFR calc Af Amer 5 (*)    All other components within normal limits  TROPONIN I - Abnormal; Notable for the following:    Troponin I 0.04 (*)    All other components within normal limits  TROPONIN I - Abnormal; Notable for the following:    Troponin I 0.03 (*)    All other components within normal limits    EKG  EKG Interpretation None      ED ECG REPORT I, Wandra Arthurs, the attending physician, personally viewed and interpreted this ECG.   Date: 08/18/2016  EKG Time: 09:53 am  Rate: 80  Rhythm: normal EKG, normal sinus rhythm  Axis: normal  Intervals:none  ST&T Change: nonspecific    Radiology Ct Head Wo Contrast  Result Date: 08/18/2016 CLINICAL DATA:  Migraine headache at dialysis. History of hypertension, diabetes, end-stage renal disease on dialysis. EXAM: CT HEAD WITHOUT CONTRAST TECHNIQUE: Contiguous axial images were obtained from the base of the skull through the vertex without intravenous contrast. COMPARISON:  CT HEAD July 21, 2016 FINDINGS: BRAIN: The ventricles and sulci are normal. No intraparenchymal hemorrhage, mass effect nor midline shift. Patchy supratentorial white matter hypodensities. No acute large vascular territory infarcts. No abnormal extra-axial fluid collections. Basal cisterns are patent. VASCULAR: Unremarkable. SKULL/SOFT TISSUES: No skull fracture. No significant  soft tissue swelling. Old RIGHT medial orbital blowout fracture. The  mastoid aircells and included paranasal sinuses are well-aerated. OTHER: None. IMPRESSION: No acute intracranial process. Stable examination: Mild chronic small vessel ischemic disease. Electronically Signed   By: Elon Alas M.D.   On: 08/18/2016 14:27    Procedures Procedures (including critical care time)  Medications Ordered in ED Medications  HYDROmorphone (DILAUDID) injection 1 mg (1 mg Intravenous Given 08/18/16 0937)  promethazine (PHENERGAN) injection 25 mg (25 mg Intravenous Given 08/18/16 0932)  metoCLOPramide (REGLAN) injection 10 mg (10 mg Intravenous Given 08/18/16 0934)  diphenhydrAMINE (BENADRYL) injection 25 mg (25 mg Intravenous Given 08/18/16 0935)  hydrALAZINE (APRESOLINE) injection 10 mg (10 mg Intravenous Given 08/18/16 1104)  LORazepam (ATIVAN) injection 1 mg (1 mg Intravenous Given 08/18/16 1147)  cloNIDine (CATAPRES) tablet 0.1 mg (0.1 mg Oral Given 08/18/16 1148)  ondansetron (ZOFRAN) injection 4 mg (4 mg Intravenous Given 08/18/16 1146)  metoprolol (LOPRESSOR) tablet 50 mg (50 mg Oral Given 08/18/16 1314)  HYDROmorphone (DILAUDID) injection 1 mg (1 mg Intravenous Given 08/18/16 1441)     Initial Impression / Assessment and Plan / ED Course  I have reviewed the triage vital signs and the nursing notes.  Pertinent labs & imaging results that were available during my care of the patient were reviewed by me and considered in my medical decision making (see chart for details).  Clinical Course    Juliauna Stueve is a 56 y.o. female here with headache, hypertension. Similar to previous migraines. Nonfocal neuro exam. She is hypertensive on arrival, but was hypertensive on previous visits. I doubt SAH or bleed. Likely migraines with uncontrolled HTN. Will get labs, give migraine cocktail.   3:13 PM Initial trop 0.04, delta 0.03. CMP baseline (Cr 8.3 expected, nl K). CT head unremarkable. Initial BP 180-190s, given  BP meds, BP about the same. Given pain meds. Patient actually have a pain doctor that prescribes her percocet. I told her that I won't be able to prescribe pain meds. Likely acute on chronic headaches and chronic pain. Will have her follow up with nephrology, neurology, pain doctor.   Final Clinical Impressions(s) / ED Diagnoses   Final diagnoses:  None    New Prescriptions New Prescriptions   No medications on file     Drenda Freeze, MD 08/18/16 1515

## 2016-08-18 NOTE — ED Notes (Signed)
Pt discharged home after verbalizing understanding of discharge instructions; nad noted. 

## 2016-08-18 NOTE — ED Triage Notes (Signed)
Pt from dialysis with migraine. Hx of same. Pt was 45 minutes short of completing dialysis when EMS arrived.

## 2016-08-18 NOTE — ED Notes (Signed)
Pt vomited in room floor; calling out. Pt c/o pain in her jaw, also very anxious. Pt room cleaned, linens and gown changed, medications administered.

## 2016-08-18 NOTE — Discharge Instructions (Signed)
Take your medicines as prescribed.   Your blood pressure is elevated and you need to talk to your kidney about it and take your BP meds.   Talk to your pain doctor regarding your pain meds.   See neurologist for recurrent headaches.   Return to ER if you have worse headaches, chest pain, shortness of breath, weakness, numbness, trouble speaking

## 2016-08-20 ENCOUNTER — Emergency Department: Payer: Medicare Other

## 2016-08-20 ENCOUNTER — Encounter: Payer: Self-pay | Admitting: *Deleted

## 2016-08-20 ENCOUNTER — Emergency Department
Admission: EM | Admit: 2016-08-20 | Discharge: 2016-08-20 | Disposition: A | Discharge status: Home / Self Care | Payer: Medicare Other | Attending: Emergency Medicine | Admitting: Emergency Medicine

## 2016-08-20 DIAGNOSIS — N186 End stage renal disease: Secondary | ICD-10-CM | POA: Diagnosis not present

## 2016-08-20 DIAGNOSIS — R51 Headache: Secondary | ICD-10-CM | POA: Diagnosis present

## 2016-08-20 DIAGNOSIS — Z7982 Long term (current) use of aspirin: Secondary | ICD-10-CM | POA: Diagnosis not present

## 2016-08-20 DIAGNOSIS — R0789 Other chest pain: Secondary | ICD-10-CM | POA: Diagnosis not present

## 2016-08-20 DIAGNOSIS — Z992 Dependence on renal dialysis: Secondary | ICD-10-CM | POA: Diagnosis not present

## 2016-08-20 DIAGNOSIS — Z79899 Other long term (current) drug therapy: Secondary | ICD-10-CM | POA: Insufficient documentation

## 2016-08-20 DIAGNOSIS — I132 Hypertensive heart and chronic kidney disease with heart failure and with stage 5 chronic kidney disease, or end stage renal disease: Secondary | ICD-10-CM | POA: Insufficient documentation

## 2016-08-20 DIAGNOSIS — J449 Chronic obstructive pulmonary disease, unspecified: Secondary | ICD-10-CM | POA: Insufficient documentation

## 2016-08-20 DIAGNOSIS — J45909 Unspecified asthma, uncomplicated: Secondary | ICD-10-CM | POA: Insufficient documentation

## 2016-08-20 DIAGNOSIS — Z87891 Personal history of nicotine dependence: Secondary | ICD-10-CM | POA: Diagnosis not present

## 2016-08-20 DIAGNOSIS — G43C0 Periodic headache syndromes in child or adult, not intractable: Secondary | ICD-10-CM | POA: Diagnosis not present

## 2016-08-20 DIAGNOSIS — E1122 Type 2 diabetes mellitus with diabetic chronic kidney disease: Secondary | ICD-10-CM | POA: Insufficient documentation

## 2016-08-20 DIAGNOSIS — I5032 Chronic diastolic (congestive) heart failure: Secondary | ICD-10-CM | POA: Diagnosis not present

## 2016-08-20 MED ORDER — OXYCODONE-ACETAMINOPHEN 5-325 MG PO TABS
2.0000 | ORAL_TABLET | Freq: Once | ORAL | Status: AC
  Administered 2016-08-20: 2 via ORAL
  Filled 2016-08-20: qty 2

## 2016-08-20 MED ORDER — LORAZEPAM 2 MG/ML IJ SOLN
1.0000 mg | Freq: Once | INTRAMUSCULAR | Status: AC
  Administered 2016-08-20: 1 mg via INTRAVENOUS
  Filled 2016-08-20: qty 1

## 2016-08-20 NOTE — ED Provider Notes (Signed)
Time Seen: Approximately 1036  I have reviewed the triage notes  Chief Complaint: Chest Pain and Migraine   History of Present Illness: Jasmine Holloway is a 56 y.o. female who has a history of numerous chronic medical problems. She currently receives dialysis Tuesday, Thursdays, and Saturday. Patient states she will get an exacerbation of her chronic headaches after dialysis. She states this headache is typical and was just evaluated here a couple days ago for similar complaint. Patient states that normally she takes oxycodone at home for pain and is followed by the pain clinic. She states normally this resolves most of her discomfort that occurs postdialysis including left arm cramps headache etc. Patient's secondary complaint is some substernal chest discomfort she states started whenever she bent over this morning. She states it hurts in the center of her chest and points to a specific area. She denies any nausea, vomiting, shortness of breath. Headache is not different in characteristics, location, intensity of her previous migraine and postdialysis headaches  Past Medical History:  Diagnosis Date  . Asthma   . CHF (congestive heart failure) ([place])   . COPD (chronic obstructive pulmonary disease) ([place])   . Diabetes mellitus without complication ([place])   . Diastolic heart failure ([place])   . ESRD (end stage renal disease) on dialysis ([place])   . Hypertension   . Neuropathy ([place])   . Polycystic kidney disease   . Renal insufficiency     Patient Active Problem List   Diagnosis Date Noted  . Acute pulmonary edema ([place]) 07/04/2016  . Acute respiratory failure ([place]) 06/09/2016  . Atypical chest pain 06/08/2016  . Chronic pain syndrome 06/08/2016  . Anemia in stage 4 chronic kidney disease ([place]) 06/08/2016  . CHF exacerbation ([place]) 04/28/2016  . Epigastric pain 03/28/2016  . Acute CHF (congestive heart failure) ([place]) 03/28/2016  . CHF (congestive heart failure) ([place]) 03/28/2016  . Chronic  diastolic heart failure ([place]) 02/04/2016  . Hoarseness of voice 02/04/2016  . Acute on chronic diastolic CHF (congestive heart failure) ([place]) 12/22/2015  . ESRD on dialysis ([place]) 12/22/2015  . Essential hypertension 12/22/2015  . Hyperlipidemia 12/22/2015    Past Surgical History:  Procedure Laterality Date  . ARTERIOVENOUS GRAFT PLACEMENT Right    x3 (R forearm currently used for access)  . COLON SURGERY    . PERIPHERAL VASCULAR CATHETERIZATION Right 08/04/2016   Procedure: A/V Shuntogram;  Surgeon: Algernon Huxley, MD;  Location: [facility];  Service: Cardiovascular;  Laterality: Right;  . VEIN HARVEST      Past Surgical History:  Procedure Laterality Date  . ARTERIOVENOUS GRAFT PLACEMENT Right    x3 (R forearm currently used for access)  . COLON SURGERY    . PERIPHERAL VASCULAR CATHETERIZATION Right 08/04/2016   Procedure: A/V Shuntogram;  Surgeon: Algernon Huxley, MD;  Location: [facility];  Service: Cardiovascular;  Laterality: Right;  . VEIN HARVEST      Current Outpatient Rx  . Order #: [id number] Class: OTC  . Order #: [id number] Class: Historical Med  . Order #: [id number] Class: Normal  . Order #: [id number] Class: Historical Med  . Order #: [id number] Class: Historical Med  . Order #: [id number] Class: Normal  . Order #: [id number] Class: Normal  . Order #: [id number] Class: Historical Med  . Order #: [id number] Class: Historical Med  . Order #: [id number] Class: Historical Med  . Order #: [id number] Class: Historical Med  . Order #: [id number] Class: Normal  . Order #: [id number] Class: Historical Med  . Order #: [id number] Class: Historical  Med  . Order #: [id number] Class: Historical Med  . Order #: [id number] Class: Print  . Order #: [id number] Class: Historical Med  . Order #: [id number] Class: Normal  . Order #: [id number] Class: Historical Med  . Order #: [id number] Class: Historical Med  . Order #: [id number] Class: Historical Med  . Order #: [id number] Class: Print  . Order #:  [id number] Class: Normal  . Order #: [id number] Class: Normal  . Order #: [id number] Class: Print  . Order #: [id number] Class: Historical Med    Allergies:  Morphine and related; Strawberry extract; Tramadol; Venofer [iron sucrose]; Vicodin [hydrocodone-acetaminophen]; Buprenorphine hcl; and Codeine  Family History: Family History  Problem Relation Age of Onset  . Hypertension Brother   . Heart failure Brother   . Hypertension Mother   . Heart disease Mother     Social History: Social History  Substance Use Topics  . Smoking status: Former Smoker    Packs/day: 0.25    Quit date: 08/11/2015  . Smokeless tobacco: Never Used  . Alcohol use No     Review of Systems:   10 point review of systems was performed and was otherwise negative:  Constitutional: No fever Eyes: No visual disturbances. No photophobia ENT: No sore throat, ear pain Cardiac: Substernal chest discomfort without radiation Respiratory: No shortness of breath, wheezing, or stridor Abdomen: No abdominal pain, no vomiting, No diarrhea Endocrine: No weight loss, No night sweats Extremities: No peripheral edema, cyanosis Skin: No rashes, easy bruising Neurologic: No focal weakness, trouble with speech or swollowing Urologic: Patient receives dialysis and is currently on the transplant list   Physical Exam:  ED Triage Vitals  Enc Vitals Group     BP 08/20/16 1038 (!) 168/97     Pulse Rate 08/20/16 1038 77     Resp 08/20/16 1038 18     Temp 08/20/16 1038 98.9 F (37.2 C)     Temp Source 08/20/16 1038 Oral     SpO2 08/20/16 1038 98 %     Weight 08/20/16 1039 182 lb (82.6 kg)     Height 08/20/16 1039 5\' 3"  (1.6 m)     Head Circumference --      Peak Flow --      Pain Score 08/20/16 1039 7     Pain Loc --      Pain Edu? --      Excl. in [place]? --     General: Awake , Alert , and Oriented times 3; GCS 15 . Anxious Head: Normal cephalic , atraumatic Eyes: Pupils equal , round, reactive to light Nose/Throat:  No nasal drainage, patent upper airway without erythema or exudate.  Neck: Supple, Full range of motion, No anterior adenopathy or palpable thyroid masses Lungs: Clear to ascultation without wheezes , rhonchi, or rales Heart: Regular rate, regular rhythm without murmurs , gallops , or rubs Abdomen: Soft, non tender without rebound, guarding , or rigidity; bowel sounds positive and symmetric in all 4 quadrants. No organomegaly .        Extremities: Fistula in the right upper extremity shows good thrill to palpation with no active bleeding with dressing in place. Neurologic: normal ambulation, Motor symmetric without deficits, sensory intact Skin: warm, dry, no rashes Chest wall shows reproducible pain with palpation across his sternal region  Labs:   All laboratory work was reviewed including any pertinent negatives or positives listed below:  Labs Reviewed - No data to display  EKG:  ED ECG REPORT I, Daymon Larsen, the attending physician, personally viewed and interpreted this  ECG.  Date: 08/20/2016 EKG Time: 1034 Rate: 78 Rhythm: normal sinus rhythm QRS Axis: normal Intervals: normal ST/T Wave abnormalities: Nonspecific ST-T wave abnormalities Conduction Disturbances: none Narrative Interpretation: unremarkable Left ventricular hypertrophy Poor R-wave progression in the anterior leads No acute ischemic changes    Radiology:  "Dg Chest 2 View  Result Date: 08/20/2016 CLINICAL DATA:  Chest pain after dialysis, migraine headache EXAM: CHEST  2 VIEW COMPARISON:  Chest x-ray of 07/21/2016 FINDINGS: The lungs appear slightly better aerated. However there is persistent cardiomegaly with mild pulmonary vascular congestion and minimal interstitial edema remaining. No pleural effusion is seen. No bony abnormality is seen. IMPRESSION: 1. Cardiomegaly with mild interstitial edema. 2. Slightly improved aeration Electronically Signed   By: Ivar Drape M.D.   On: 08/20/2016 11:20   Ct Head  Wo Contrast  Result Date: 08/20/2016 CLINICAL DATA:  Migraine headache and chest pain after dialysis EXAM: CT HEAD WITHOUT CONTRAST TECHNIQUE: Contiguous axial images were obtained from the base of the skull through the vertex without intravenous contrast. COMPARISON:  CT brain scan of 08/18/2016 FINDINGS: Brain: The ventricular system is stable in size and configuration and the septum is midline in position. The fourth ventricle and basilar cisterns are unremarkable. There is mild small vessel ischemic change throughout the periventricular white matter. However, no hemorrhage, mass lesion, or acute infarction is seen. Benign-appearing bilateral basal ganglial calcifications are stable. Vascular: No vascular abnormality is seen on this unenhanced study. Skull: On bone window images, no calvarial abnormality is seen. Sinuses/Orbits: The paranasal sinuses are well pneumatized. Other: None. IMPRESSION: No change in mild small vessel ischemic change throughout the periventricular white matter. No acute intracranial abnormality. Electronically Signed   By: Ivar Drape M.D.   On: 08/20/2016 11:13   Ct Head Wo Contrast  Result Date: 08/18/2016 CLINICAL DATA:  Migraine headache at dialysis. History of hypertension, diabetes, end-stage renal disease on dialysis. EXAM: CT HEAD WITHOUT CONTRAST TECHNIQUE: Contiguous axial images were obtained from the base of the skull through the vertex without intravenous contrast. COMPARISON:  CT HEAD July 21, 2016 FINDINGS: BRAIN: The ventricles and sulci are normal. No intraparenchymal hemorrhage, mass effect nor midline shift. Patchy supratentorial white matter hypodensities. No acute large vascular territory infarcts. No abnormal extra-axial fluid collections. Basal cisterns are patent. VASCULAR: Unremarkable. SKULL/SOFT TISSUES: No skull fracture. No significant soft tissue swelling. Old RIGHT medial orbital blowout fracture. The mastoid aircells and included paranasal sinuses  are well-aerated. OTHER: None. IMPRESSION: No acute intracranial process. Stable examination: Mild chronic small vessel ischemic disease. Electronically Signed   By: Elon Alas M.D.   On: 08/18/2016 14:27  "   I personally reviewed the radiologic studies    ED Course:  Since the patient had recent dialysis but did not feel that she required laboratory work at this time. Her blood pressures within normal limits further dictation. She was given Ativan IV 1 mg and also a little oxycodone tablet. Patient had symptomatic improvement here in emergency department. She is under chronic pain control and is followed by the pain center based on her description of her history and she understands that we can't prescribe her a lot of outpatient pain medication at this time. Be any change in the characteristics of her headache from previous headaches that she's had in the past associated with her dialysis. Her chest pain is clearly chest wall pains reproducible and is no findings on her EKG did indicate a carditis, acute coronary syndrome, etc. She does not  have any signs of pulmonary edema on her exam and chest x-ray evaluation. Clinical Course      Assessment: Acute exacerbation of chronic migraine cephalgia Chest wall pain    Plan: Outpatient Patient was advised to return immediately if condition worsens. Patient was advised to follow up with their primary care physician or other specialized physicians involved in their outpatient care. The patient and/or family member/power of attorney had laboratory results reviewed at the bedside. All questions and concerns were addressed and appropriate discharge instructions were distributed by the nursing staff.            Daymon Larsen, MD 08/20/16 [phone]

## 2016-08-20 NOTE — ED Notes (Signed)
EDP at bedside  

## 2016-08-20 NOTE — Discharge Instructions (Signed)
Please continue current outpatient medications and contact her pain specialist for further outpatient treatment.  Please return immediately if condition worsens. Please contact her primary physician or the physician you were given for referral. If you have any specialist physicians involved in her treatment and plan please also contact them. Thank you for using  regional emergency Department.

## 2016-08-20 NOTE — ED Triage Notes (Signed)
Pt arrives via EMS from dialayis with complaints of a migrane and CP, states CP began after her tx when she was leaving, dialysis gave 1 nitro, states she had her full tx and removed 3L, pt states midtsernal CP worse with touch, access right arm

## 2016-08-20 NOTE — ED Notes (Signed)
Pt told she could get dressed and told where the bathroom was to change. Called [facility]

## 2016-08-20 NOTE — ED Notes (Signed)
Patient transported to CT 

## 2016-08-20 NOTE — ED Notes (Signed)
Pt to be picked up by [facility]

## 2016-08-20 NOTE — ED Notes (Signed)
Per pt she was told by dialysis not to take her pain medicine prior to dialysis. Is only allowed one percocet a day per pain mgmt and now has been getting migraines. Advised she speaks to her pain mgmt

## 2016-09-15 ENCOUNTER — Encounter (INDEPENDENT_AMBULATORY_CARE_PROVIDER_SITE_OTHER): Payer: Medicare Other

## 2016-09-15 ENCOUNTER — Ambulatory Visit (INDEPENDENT_AMBULATORY_CARE_PROVIDER_SITE_OTHER): Payer: Medicare Other | Admitting: Vascular Surgery

## 2016-09-17 ENCOUNTER — Other Ambulatory Visit: Payer: Self-pay | Admitting: Family Medicine

## 2016-09-17 DIAGNOSIS — Z1231 Encounter for screening mammogram for malignant neoplasm of breast: Secondary | ICD-10-CM

## 2016-10-13 ENCOUNTER — Ambulatory Visit
Admission: RE | Admit: 2016-10-13 | Discharge: 2016-10-13 | Disposition: A | Discharge status: Home / Self Care | Payer: Medicare Other | Source: Ambulatory Visit | Attending: Family Medicine | Admitting: Family Medicine

## 2016-10-13 DIAGNOSIS — R928 Other abnormal and inconclusive findings on diagnostic imaging of breast: Secondary | ICD-10-CM | POA: Diagnosis not present

## 2016-10-13 DIAGNOSIS — Z1231 Encounter for screening mammogram for malignant neoplasm of breast: Secondary | ICD-10-CM | POA: Diagnosis not present

## 2016-10-14 ENCOUNTER — Emergency Department
Admission: EM | Admit: 2016-10-14 | Discharge: 2016-10-15 | Discharge status: Against Medical Advice | Payer: Medicare Other | Attending: Emergency Medicine | Admitting: Emergency Medicine

## 2016-10-14 ENCOUNTER — Encounter: Payer: Self-pay | Admitting: Emergency Medicine

## 2016-10-14 DIAGNOSIS — N186 End stage renal disease: Secondary | ICD-10-CM | POA: Insufficient documentation

## 2016-10-14 DIAGNOSIS — J449 Chronic obstructive pulmonary disease, unspecified: Secondary | ICD-10-CM | POA: Insufficient documentation

## 2016-10-14 DIAGNOSIS — Z7982 Long term (current) use of aspirin: Secondary | ICD-10-CM | POA: Diagnosis not present

## 2016-10-14 DIAGNOSIS — G43809 Other migraine, not intractable, without status migrainosus: Secondary | ICD-10-CM | POA: Insufficient documentation

## 2016-10-14 DIAGNOSIS — I132 Hypertensive heart and chronic kidney disease with heart failure and with stage 5 chronic kidney disease, or end stage renal disease: Secondary | ICD-10-CM | POA: Diagnosis not present

## 2016-10-14 DIAGNOSIS — J45909 Unspecified asthma, uncomplicated: Secondary | ICD-10-CM | POA: Diagnosis not present

## 2016-10-14 DIAGNOSIS — I509 Heart failure, unspecified: Secondary | ICD-10-CM | POA: Diagnosis not present

## 2016-10-14 DIAGNOSIS — Z87891 Personal history of nicotine dependence: Secondary | ICD-10-CM | POA: Insufficient documentation

## 2016-10-14 DIAGNOSIS — Z992 Dependence on renal dialysis: Secondary | ICD-10-CM | POA: Diagnosis not present

## 2016-10-14 DIAGNOSIS — Z79899 Other long term (current) drug therapy: Secondary | ICD-10-CM | POA: Diagnosis not present

## 2016-10-14 DIAGNOSIS — R51 Headache: Secondary | ICD-10-CM | POA: Diagnosis present

## 2016-10-14 MED ORDER — PROCHLORPERAZINE EDISYLATE 5 MG/ML IJ SOLN
5.0000 mg | Freq: Once | INTRAMUSCULAR | Status: AC
  Administered 2016-10-14: 5 mg via INTRAVENOUS
  Filled 2016-10-14: qty 2

## 2016-10-14 MED ORDER — DIPHENHYDRAMINE HCL 50 MG/ML IJ SOLN
12.5000 mg | Freq: Once | INTRAMUSCULAR | Status: AC
  Administered 2016-10-14: 12.5 mg via INTRAVENOUS
  Filled 2016-10-14: qty 1

## 2016-10-14 NOTE — ED Notes (Signed)
Pt resting quietly now on stretcher in exam room; st feeling better now with pain decreased to 6/10

## 2016-10-14 NOTE — ED Triage Notes (Addendum)
Pt to room 3 via stretcher, brought in by EMS from home, moaning, tearful, eyes closed; pt reports frontal migraine since 630pm after dialysis accomp by photosensitivity and N/V; st hx of same after dialysis; pt st pain also to temples and back of head; pt A&Ox3, MAEW, PERRL, speech clear; resp even/unlab, lungs clear, apical audible; pt rates HA "15/10"

## 2016-10-15 MED ORDER — PROCHLORPERAZINE MALEATE 10 MG PO TABS: 10.0000 mg | ORAL_TABLET | Freq: Four times a day (QID) | ORAL | 0 refills | Status: DC | PRN

## 2016-10-15 NOTE — Discharge Instructions (Signed)
1. You may take Compazine as needed for headache and nausea. 2. Return to the ER for worsening symptoms, persistent vomiting, difficult breathing or other concerns.

## 2016-10-15 NOTE — ED Provider Notes (Signed)
[facility] Emergency Department Provider Note   ____________________________________________   First MD Initiated Contact with Patient 10/14/16 2310     (approximate)  I have reviewed the triage vital signs and the nursing notes.   HISTORY  Chief Complaint Migraine    HPI Jasmine Holloway is a 56 y.o. female brought to the ED from home via EMS with a chief complaint of migraine headache. Patient has a history of ESRD on HD M/W/F who reports frequent headaches after dialysis. Estimates she has a headache after every third or fourth dialysis treatments. Complains of frontal migraine since 6:30 PM after dialysis. Nonradiating headache associated with photosensitivity, nausea and vomiting. Reports blood pressure is elevated at baseline with systolic blood pressure routinely in the 741U diastolic blood pressure as high as 160s. States she is compliant with her antihypertensives. Denies associated fever, chills, chest pain, shortness of breath, abdominal pain, diarrhea. Denies recent travel or trauma. Nothing makes her symptoms better or worse.   Past Medical History:  Diagnosis Date  . Asthma   . CHF (congestive heart failure) ([place])   . COPD (chronic obstructive pulmonary disease) ([place])   . Diabetes mellitus without complication ([place])   . Diastolic heart failure ([place])   . ESRD (end stage renal disease) on dialysis ([place])   . Hypertension   . Neuropathy ([place])   . Polycystic kidney disease   . Renal insufficiency     Patient Active Problem List   Diagnosis Date Noted  . Acute pulmonary edema ([place]) 07/04/2016  . Acute respiratory failure ([place]) 06/09/2016  . Atypical chest pain 06/08/2016  . Chronic pain syndrome 06/08/2016  . Anemia in stage 4 chronic kidney disease ([place]) 06/08/2016  . CHF exacerbation ([place]) 04/28/2016  . Epigastric pain 03/28/2016  . Acute CHF (congestive heart failure) ([place]) 03/28/2016  . CHF (congestive heart failure) ([place]) 03/28/2016  .  Chronic diastolic heart failure ([place]) 02/04/2016  . Hoarseness of voice 02/04/2016  . Acute on chronic diastolic CHF (congestive heart failure) ([place]) 12/22/2015  . ESRD on dialysis ([place]) 12/22/2015  . Essential hypertension 12/22/2015  . Hyperlipidemia 12/22/2015    Past Surgical History:  Procedure Laterality Date  . ARTERIOVENOUS GRAFT PLACEMENT Right    x3 (R forearm currently used for access)  . BREAST BIOPSY Left 12/03/2015   neg  . COLON SURGERY    . PERIPHERAL VASCULAR CATHETERIZATION Right 08/04/2016   Procedure: A/V Shuntogram;  Surgeon: Algernon Huxley, MD;  Location: [facility];  Service: Cardiovascular;  Laterality: Right;  . VEIN HARVEST      Prior to Admission medications   Medication Sig Start Date End Date Taking? Authorizing Provider  acetaminophen (TYLENOL) 325 MG tablet Take 2 tablets (650 mg total) by mouth every 6 (six) hours as needed for mild pain or headache. 03/18/16   Nicholes Mango, MD  albuterol-ipratropium (COMBIVENT) 18-103 MCG/ACT inhaler Inhale 2 puffs into the lungs daily as needed for wheezing or shortness of breath.    Historical Provider, MD  aspirin 81 MG chewable tablet Chew 1 tablet (81 mg total) by mouth daily. 01/17/16   Gladstone Lighter, MD  b complex-vitamin c-folic acid (NEPHRO-VITE) 0.8 MG TABS tablet Take 1 tablet by mouth at bedtime.    Historical Provider, MD  baclofen (LIORESAL) 10 MG tablet Take 5 mg by mouth 2 (two) times daily.    Historical Provider, MD  calcium acetate (PHOSLO) 667 MG capsule Take 3 capsules (2,001 mg total) by mouth 3 (three) times  daily with meals. Patient taking differently: Take 667 mg by mouth 3 (three) times daily with meals.  07/07/16   Fritzi Mandes, MD  cloNIDine (CATAPRES) 0.1 MG tablet Take 1 tablet (0.1 mg total) by mouth 2 (two) times daily. 01/17/16   Gladstone Lighter, MD  cloNIDine (CATAPRES) 0.1 MG tablet Take 0.1 mg by mouth as needed. Prn diastolic >564 and/or systolic B/P >332    Historical Provider,  MD  diphenhydrAMINE (BENADRYL) 25 MG tablet Take 25 mg by mouth as needed. Prn anaphylaxis    Historical Provider, MD  Doxercalciferol (HECTOROL) 1 MCG CAPS Take 3.5 mcg by mouth 3 (three) times a week.    Historical Provider, MD  epoetin alfa (EPOGEN) 3000 UNIT/ML injection Inject 6,000 Units into the vein 3 (three) times a week.    Historical Provider, MD  furosemide (LASIX) 80 MG tablet Take 1 tablet (80 mg total) by mouth daily. 03/05/16 08/18/16  Alisa Graff, FNP  gabapentin (NEURONTIN) 300 MG capsule Take 300 mg by mouth 2 (two) times daily.     Historical Provider, MD  irbesartan (AVAPRO) 150 MG tablet Take 1 tablet by mouth daily. 05/20/16   Historical Provider, MD  iron sucrose (VENOFER) 20 MG/ML injection Inject 50 mg into the vein once a week.    Historical Provider, MD  LORazepam (ATIVAN) 1 MG tablet Take 1 tablet (1 mg total) by mouth 2 (two) times daily as needed for anxiety. Patient not taking: Reported on 08/18/2016 07/21/16   Daymon Larsen, MD  losartan (COZAAR) 25 MG tablet 25 mg daily.  05/12/16   Historical Provider, MD  metolazone (ZAROXOLYN) 5 MG tablet Take 1 tablet (5 mg total) by mouth daily as needed. Patient taking differently: Take 5 mg by mouth daily.  06/08/16 09/06/16  Minna Merritts, MD  metoprolol (LOPRESSOR) 50 MG tablet 50 mg 2 (two) times daily.  05/12/16   Historical Provider, MD  nortriptyline (PAMELOR) 25 MG capsule Take 25 mg by mouth at bedtime.    Historical Provider, MD  omeprazole (PRILOSEC) 40 MG capsule Take 40 mg by mouth daily.     Historical Provider, MD  ondansetron (ZOFRAN) 4 MG tablet Take 1 tablet (4 mg total) by mouth every 8 (eight) hours as needed for nausea or vomiting. Patient not taking: Reported on 08/18/2016 07/21/16   Daymon Larsen, MD  prochlorperazine (COMPAZINE) 10 MG tablet Take 1 tablet (10 mg total) by mouth every 6 (six) hours as needed for nausea. 10/15/16   Paulette Blanch, MD  rosuvastatin (CRESTOR) 5 MG tablet Take 1 tablet (5 mg  total) by mouth at bedtime. 07/07/16   Fritzi Mandes, MD  SENSIPAR 30 MG tablet Take 1 tablet (30 mg total) by mouth daily. 07/07/16   Fritzi Mandes, MD  traMADol (ULTRAM) 50 MG tablet Take 1 tablet (50 mg total) by mouth every 6 (six) hours as needed. Patient not taking: Reported on 08/18/2016 07/21/16   Daymon Larsen, MD  VENTOLIN HFA 108 [phone] Base) MCG/ACT inhaler Inhale 2 puffs into the lungs daily as needed.  05/21/16   Historical Provider, MD    Allergies Morphine and related; Strawberry extract; Tramadol; Venofer [iron sucrose]; Vicodin [hydrocodone-acetaminophen]; Buprenorphine hcl; and Codeine  Family History  Problem Relation Age of Onset  . Hypertension Brother   . Heart failure Brother   . Hypertension Mother   . Heart disease Mother     Social History Social History  Substance Use Topics  . Smoking status: Former  Smoker    Packs/day: 0.25    Quit date: 08/11/2015  . Smokeless tobacco: Never Used  . Alcohol use No    Review of Systems  Constitutional: No fever/chills. Eyes: No visual changes. ENT: No sore throat. Cardiovascular: Denies chest pain. Respiratory: Denies shortness of breath. Gastrointestinal: No abdominal pain.  No nausea, no vomiting.  No diarrhea.  No constipation. Genitourinary: Negative for dysuria. Musculoskeletal: Negative for back pain. Skin: Negative for rash. Neurological: Positive for headache. Negative for focal weakness or numbness.  10-point ROS otherwise negative.  ____________________________________________   PHYSICAL EXAM:  VITAL SIGNS: ED Triage Vitals [10/14/16 2242]  Enc Vitals Group     BP (!) 221/120     Pulse Rate 86     Resp (!) 22     Temp 97.7 F (36.5 C)     Temp Source Oral     SpO2 95 %     Weight 184 lb (83.5 kg)     Height 5\' 3"  (1.6 m)     Head Circumference      Peak Flow      Pain Score 10     Pain Loc      Pain Edu?      Excl. in [place]?     Constitutional: Alert and oriented. Well appearing and in mild  acute distress. Eyes: Conjunctivae are normal. PERRL. EOMI. Head: Atraumatic. Nose: No congestion/rhinnorhea. Mouth/Throat: Mucous membranes are moist.  Oropharynx non-erythematous. Neck: No stridor.  Supple neck without meningismus.  No carotid bruits. Cardiovascular: Normal rate, regular rhythm. Grossly normal heart sounds.  Good peripheral circulation. Respiratory: Normal respiratory effort.  No retractions. Lungs CTAB. Gastrointestinal: Soft and nontender. No distention. No abdominal bruits. No CVA tenderness. Musculoskeletal: No lower extremity tenderness nor edema.  No joint effusions. Neurologic:  CN II-XII intact. Normal speech and language. No gross focal neurologic deficits are appreciated.  Skin:  Skin is warm, dry and intact. No rash noted. No petechiae. Psychiatric: Mood and affect are normal. Speech and behavior are normal.  ____________________________________________   LABS (all labs ordered are listed, but only abnormal results are displayed)  Labs Reviewed - No data to display ____________________________________________  EKG  None ____________________________________________  RADIOLOGY  None - last CT head 08/20/2016 ____________________________________________   PROCEDURES  Procedure(s) performed: None  Procedures  Critical Care performed: No  ____________________________________________   INITIAL IMPRESSION / ASSESSMENT AND PLAN / ED COURSE  Pertinent labs & imaging results that were available during my care of the patient were reviewed by me and considered in my medical decision making (see chart for details).  56 year old female who presents with migraine headache status post hemodialysis. Patient is unconcerned with her elevated blood pressure and states that is normal for her. Neck is supple without focal neurological deficits. She is not experiencing chest pain or shortness of breath. Received Zofran per EMS. Will administer Compazine with  Benadryl and reassess.  Clinical Course as of Oct 15 141  Thu Oct 15, 2016  0125 Patient feeling significantly better after Compazine with Benadryl. Blood pressure is also slightly decreased. Will discharge home with Benadryl and close follow-up with her PCP. Strict return precautions given. Patient verbalizes understanding and agrees with plan of care.  [JS]    Clinical Course User Index [JS] Paulette Blanch, MD     ____________________________________________   FINAL CLINICAL IMPRESSION(S) / ED DIAGNOSES  Final diagnoses:  Other migraine without status migrainosus, not intractable      NEW MEDICATIONS STARTED DURING  THIS VISIT:  New Prescriptions   PROCHLORPERAZINE (COMPAZINE) 10 MG TABLET    Take 1 tablet (10 mg total) by mouth every 6 (six) hours as needed for nausea.     Note:  This document was prepared using Dragon voice recognition software and may include unintentional dictation errors.    Paulette Blanch, MD 10/15/16 [phone]

## 2016-10-16 ENCOUNTER — Encounter: Payer: Self-pay | Admitting: Emergency Medicine

## 2016-10-16 ENCOUNTER — Emergency Department
Admission: EM | Admit: 2016-10-16 | Discharge: 2016-10-16 | Disposition: A | Discharge status: Home / Self Care | Payer: Medicare Other | Attending: Emergency Medicine | Admitting: Emergency Medicine

## 2016-10-16 ENCOUNTER — Emergency Department: Payer: Medicare Other

## 2016-10-16 DIAGNOSIS — Z79899 Other long term (current) drug therapy: Secondary | ICD-10-CM | POA: Insufficient documentation

## 2016-10-16 DIAGNOSIS — Z992 Dependence on renal dialysis: Secondary | ICD-10-CM | POA: Insufficient documentation

## 2016-10-16 DIAGNOSIS — G43901 Migraine, unspecified, not intractable, with status migrainosus: Secondary | ICD-10-CM | POA: Diagnosis not present

## 2016-10-16 DIAGNOSIS — J449 Chronic obstructive pulmonary disease, unspecified: Secondary | ICD-10-CM | POA: Insufficient documentation

## 2016-10-16 DIAGNOSIS — R42 Dizziness and giddiness: Secondary | ICD-10-CM

## 2016-10-16 DIAGNOSIS — Z87891 Personal history of nicotine dependence: Secondary | ICD-10-CM | POA: Insufficient documentation

## 2016-10-16 DIAGNOSIS — I5032 Chronic diastolic (congestive) heart failure: Secondary | ICD-10-CM | POA: Insufficient documentation

## 2016-10-16 DIAGNOSIS — E1122 Type 2 diabetes mellitus with diabetic chronic kidney disease: Secondary | ICD-10-CM | POA: Diagnosis not present

## 2016-10-16 DIAGNOSIS — I132 Hypertensive heart and chronic kidney disease with heart failure and with stage 5 chronic kidney disease, or end stage renal disease: Secondary | ICD-10-CM | POA: Insufficient documentation

## 2016-10-16 DIAGNOSIS — J45909 Unspecified asthma, uncomplicated: Secondary | ICD-10-CM | POA: Diagnosis not present

## 2016-10-16 DIAGNOSIS — Z7982 Long term (current) use of aspirin: Secondary | ICD-10-CM | POA: Diagnosis not present

## 2016-10-16 DIAGNOSIS — N186 End stage renal disease: Secondary | ICD-10-CM | POA: Diagnosis not present

## 2016-10-16 LAB — BASIC METABOLIC PANEL
ANION GAP: 11 (ref 5–15)
BUN: 47 mg/dL — ABNORMAL HIGH (ref 6–20)
CALCIUM: 8.8 mg/dL — AB (ref 8.9–10.3)
CO2: 30 mmol/L (ref 22–32)
Chloride: 99 mmol/L — ABNORMAL LOW (ref 101–111)
Creatinine, Ser: 10.28 mg/dL — ABNORMAL HIGH (ref 0.44–1.00)
GFR, EST AFRICAN AMERICAN: 4 mL/min — AB (ref >60)
GFR, EST NON AFRICAN AMERICAN: 4 mL/min — AB (ref >60)
GLUCOSE: 95 mg/dL (ref 65–99)
POTASSIUM: 5.2 mmol/L — AB (ref 3.5–5.1)
SODIUM: 140 mmol/L (ref 135–145)

## 2016-10-16 LAB — URINALYSIS, COMPLETE (UACMP) WITH MICROSCOPIC
BILIRUBIN URINE: NEGATIVE
GLUCOSE, UA: 150 mg/dL — AB
HGB URINE DIPSTICK: NEGATIVE
KETONES UR: NEGATIVE mg/dL
NITRITE: NEGATIVE
Specific Gravity, Urine: 1.008 (ref 1.005–1.030)
pH: 9 — ABNORMAL HIGH (ref 5.0–8.0)

## 2016-10-16 LAB — CBC
HEMATOCRIT: 33 % — AB (ref 35.0–47.0)
Hemoglobin: 10.8 g/dL — ABNORMAL LOW (ref 12.0–16.0)
MCH: 28.7 pg (ref 26.0–34.0)
MCHC: 32.7 g/dL (ref 32.0–36.0)
MCV: 87.8 fL (ref 80.0–100.0)
Platelets: 259 10*3/uL (ref 150–440)
RBC: 3.75 MIL/uL — AB (ref 3.80–5.20)
RDW: 19.4 % — ABNORMAL HIGH (ref 11.5–14.5)
WBC: 7.6 10*3/uL (ref 3.6–11.0)

## 2016-10-16 MED ORDER — DIPHENHYDRAMINE HCL 25 MG PO CAPS: 50.0000 mg | ORAL_CAPSULE | Freq: Four times a day (QID) | ORAL | 0 refills | Status: DC | PRN

## 2016-10-16 MED ORDER — PROMETHAZINE HCL 25 MG PO TABS: 25.0000 mg | ORAL_TABLET | Freq: Four times a day (QID) | ORAL | 0 refills | Status: DC | PRN

## 2016-10-16 MED ORDER — CEFDINIR 300 MG PO CAPS: 300.0000 mg | ORAL_CAPSULE | Freq: Two times a day (BID) | ORAL | 0 refills | Status: DC

## 2016-10-16 MED ORDER — CLONIDINE HCL 0.1 MG PO TABS
0.2000 mg | ORAL_TABLET | Freq: Once | ORAL | Status: AC
  Administered 2016-10-16: 0.2 mg via ORAL
  Filled 2016-10-16: qty 2

## 2016-10-16 MED ORDER — METOCLOPRAMIDE HCL 10 MG PO TABS: 10.0000 mg | ORAL_TABLET | Freq: Four times a day (QID) | ORAL | 0 refills | Status: DC | PRN

## 2016-10-16 MED ORDER — METOCLOPRAMIDE HCL 10 MG PO TABS
10.0000 mg | ORAL_TABLET | Freq: Once | ORAL | Status: AC
  Administered 2016-10-16: 10 mg via ORAL
  Filled 2016-10-16: qty 1

## 2016-10-16 MED ORDER — CEFPODOXIME PROXETIL 200 MG PO TABS
200.0000 mg | ORAL_TABLET | Freq: Once | ORAL | Status: AC
  Administered 2016-10-16: 200 mg via ORAL
  Filled 2016-10-16: qty 1

## 2016-10-16 MED ORDER — DIPHENHYDRAMINE HCL 25 MG PO CAPS
50.0000 mg | ORAL_CAPSULE | Freq: Once | ORAL | Status: AC
  Administered 2016-10-16: 50 mg via ORAL
  Filled 2016-10-16: qty 2

## 2016-10-16 NOTE — ED Notes (Signed)
Called pharmacy for [facility]  - awaiting med for administration

## 2016-10-16 NOTE — ED Provider Notes (Signed)
[facility] Emergency Department Provider Note  ____________________________________________  Time seen: Approximately 4:26 PM  I have reviewed the triage vital signs and the nursing notes.   HISTORY  Chief Complaint Dizziness    HPI Jasmine Holloway is a 56 y.o. female ho complains of right temporal headache for the past 4 or 5 days ever since a trip and fall at work over a expose [place] on the floor.denies vision changes but has been feeling dizzy, worse with change in position and had an IV movement. She has a history of migraines after dialysis and she states this is been much worse over the past 4 days. Last dialysis was 2 days ago. She is currently missing her appointment for her dialysis today at 4 PM. She does state that she'll have no trouble getting worked in [employer] for a makeup session if she is suitable for discharge home.  She also complains of left hip pain after the fall, worse with ambulation.no acute paresthesia or weakness.     Past Medical History:  Diagnosis Date  . Asthma   . CHF (congestive heart failure) ([place])   . COPD (chronic obstructive pulmonary disease) ([place])   . Diabetes mellitus without complication ([place])   . Diastolic heart failure ([place])   . ESRD (end stage renal disease) on dialysis ([place])   . Hypertension   . Neuropathy ([place])   . Polycystic kidney disease   . Renal insufficiency      Patient Active Problem List   Diagnosis Date Noted  . Acute pulmonary edema ([place]) 07/04/2016  . Acute respiratory failure ([place]) 06/09/2016  . Atypical chest pain 06/08/2016  . Chronic pain syndrome 06/08/2016  . Anemia in stage 4 chronic kidney disease ([place]) 06/08/2016  . CHF exacerbation ([place]) 04/28/2016  . Epigastric pain 03/28/2016  . Acute CHF (congestive heart failure) ([place]) 03/28/2016  . CHF (congestive heart failure) ([place]) 03/28/2016  . Chronic diastolic heart failure ([place]) 02/04/2016  . Hoarseness of voice 02/04/2016  . Acute on  chronic diastolic CHF (congestive heart failure) ([place]) 12/22/2015  . ESRD on dialysis ([place]) 12/22/2015  . Essential hypertension 12/22/2015  . Hyperlipidemia 12/22/2015     Past Surgical History:  Procedure Laterality Date  . ARTERIOVENOUS GRAFT PLACEMENT Right    x3 (R forearm currently used for access)  . BREAST BIOPSY Left 12/03/2015   neg  . COLON SURGERY    . PERIPHERAL VASCULAR CATHETERIZATION Right 08/04/2016   Procedure: A/V Shuntogram;  Surgeon: Algernon Huxley, MD;  Location: [facility];  Service: Cardiovascular;  Laterality: Right;  . VEIN HARVEST       Prior to Admission medications   Medication Sig Start Date End Date Taking? Authorizing Provider  acetaminophen (TYLENOL) 325 MG tablet Take 2 tablets (650 mg total) by mouth every 6 (six) hours as needed for mild pain or headache. 03/18/16   Nicholes Mango, MD  albuterol-ipratropium (COMBIVENT) 18-103 MCG/ACT inhaler Inhale 2 puffs into the lungs daily as needed for wheezing or shortness of breath.    Historical Provider, MD  aspirin 81 MG chewable tablet Chew 1 tablet (81 mg total) by mouth daily. 01/17/16   Gladstone Lighter, MD  b complex-vitamin c-folic acid (NEPHRO-VITE) 0.8 MG TABS tablet Take 1 tablet by mouth at bedtime.    Historical Provider, MD  baclofen (LIORESAL) 10 MG tablet Take 5 mg by mouth 2 (two) times daily.    Historical Provider, MD  calcium acetate (PHOSLO) 667 MG capsule Take 3 capsules (  2,001 mg total) by mouth 3 (three) times daily with meals. Patient taking differently: Take 667 mg by mouth 3 (three) times daily with meals.  07/07/16   Fritzi Mandes, MD  cefdinir (OMNICEF) 300 MG capsule Take 1 capsule (300 mg total) by mouth 2 (two) times daily. 10/16/16   Carrie Mew, MD  cloNIDine (CATAPRES) 0.1 MG tablet Take 1 tablet (0.1 mg total) by mouth 2 (two) times daily. 01/17/16   Gladstone Lighter, MD  cloNIDine (CATAPRES) 0.1 MG tablet Take 0.1 mg by mouth as needed. Prn diastolic >976 and/or systolic  B/P >734    Historical Provider, MD  diphenhydrAMINE (BENADRYL) 25 mg capsule Take 2 capsules (50 mg total) by mouth every 6 (six) hours as needed. 10/16/16   Carrie Mew, MD  Doxercalciferol (HECTOROL) 1 MCG CAPS Take 3.5 mcg by mouth 3 (three) times a week.    Historical Provider, MD  epoetin alfa (EPOGEN) 3000 UNIT/ML injection Inject 6,000 Units into the vein 3 (three) times a week.    Historical Provider, MD  furosemide (LASIX) 80 MG tablet Take 1 tablet (80 mg total) by mouth daily. 03/05/16 08/18/16  Alisa Graff, FNP  gabapentin (NEURONTIN) 300 MG capsule Take 300 mg by mouth 2 (two) times daily.     Historical Provider, MD  irbesartan (AVAPRO) 150 MG tablet Take 1 tablet by mouth daily. 05/20/16   Historical Provider, MD  iron sucrose (VENOFER) 20 MG/ML injection Inject 50 mg into the vein once a week.    Historical Provider, MD  LORazepam (ATIVAN) 1 MG tablet Take 1 tablet (1 mg total) by mouth 2 (two) times daily as needed for anxiety. Patient not taking: Reported on 08/18/2016 07/21/16   Daymon Larsen, MD  losartan (COZAAR) 25 MG tablet 25 mg daily.  05/12/16   Historical Provider, MD  metoCLOPramide (REGLAN) 10 MG tablet Take 1 tablet (10 mg total) by mouth every 6 (six) hours as needed. 10/16/16   Carrie Mew, MD  metolazone (ZAROXOLYN) 5 MG tablet Take 1 tablet (5 mg total) by mouth daily as needed. Patient taking differently: Take 5 mg by mouth daily.  06/08/16 09/06/16  Minna Merritts, MD  metoprolol (LOPRESSOR) 50 MG tablet 50 mg 2 (two) times daily.  05/12/16   Historical Provider, MD  nortriptyline (PAMELOR) 25 MG capsule Take 25 mg by mouth at bedtime.    Historical Provider, MD  omeprazole (PRILOSEC) 40 MG capsule Take 40 mg by mouth daily.     Historical Provider, MD  ondansetron (ZOFRAN) 4 MG tablet Take 1 tablet (4 mg total) by mouth every 8 (eight) hours as needed for nausea or vomiting. Patient not taking: Reported on 08/18/2016 07/21/16   Daymon Larsen, MD   prochlorperazine (COMPAZINE) 10 MG tablet Take 1 tablet (10 mg total) by mouth every 6 (six) hours as needed for nausea. 10/15/16   Paulette Blanch, MD  rosuvastatin (CRESTOR) 5 MG tablet Take 1 tablet (5 mg total) by mouth at bedtime. 07/07/16   Fritzi Mandes, MD  SENSIPAR 30 MG tablet Take 1 tablet (30 mg total) by mouth daily. 07/07/16   Fritzi Mandes, MD  traMADol (ULTRAM) 50 MG tablet Take 1 tablet (50 mg total) by mouth every 6 (six) hours as needed. Patient not taking: Reported on 08/18/2016 07/21/16   Daymon Larsen, MD  VENTOLIN HFA 108 [phone] Base) MCG/ACT inhaler Inhale 2 puffs into the lungs daily as needed.  05/21/16   Historical Provider, MD  Allergies Morphine and related; Strawberry extract; Tramadol; Venofer [iron sucrose]; Vicodin [hydrocodone-acetaminophen]; Buprenorphine hcl; and Codeine   Family History  Problem Relation Age of Onset  . Hypertension Brother   . Heart failure Brother   . Hypertension Mother   . Heart disease Mother     Social History Social History  Substance Use Topics  . Smoking status: Former Smoker    Packs/day: 0.25    Quit date: 08/11/2015  . Smokeless tobacco: Never Used  . Alcohol use No    Review of Systems  Constitutional:   No fever or chills.  ENT:   No sore throat. No rhinorrhea. Cardiovascular:   No chest pain. Respiratory:   No dyspnea or cough. Gastrointestinal:   Negative for abdominal pain, vomiting and diarrhea.  Genitourinary:   Negative for dysuria, chronic oliguria. Musculoskeletal:   Negative for focal pain or swelling Neurological:   Positive for recurrent headache right temporal. With dizziness. 10-point ROS otherwise negative.  ____________________________________________   PHYSICAL EXAM:  VITAL SIGNS: ED Triage Vitals [10/16/16 1309]  Enc Vitals Group     BP (!) 187/102     Pulse Rate 68     Resp 17     Temp 98.7 F (37.1 C)     Temp src      SpO2 100 %     Weight 181 lb (82.1 kg)     Height 5\' 3"  (1.6 m)      Head Circumference      Peak Flow      Pain Score 0     Pain Loc      Pain Edu?      Excl. in [place]?     Vital signs reviewed, nursing assessments reviewed.   Constitutional:   Alert and oriented. Well appearing and in no distress. Eyes:   No scleral icterus. No conjunctival pallor. PERRL. EOMI.  No nystagmus. ENT   Head:   Normocephalic and atraumatic.   Nose:   No congestion/rhinnorhea. No septal hematoma   Mouth/Throat:   MMM, no pharyngeal erythema. No peritonsillar mass.    Neck:   No stridor. No SubQ emphysema. No meningismus. Hematological/Lymphatic/Immunilogical:   No cervical lymphadenopathy. Cardiovascular:   RRR. Symmetric bilateral radial and DP pulses.  No murmurs.  Respiratory:   Normal respiratory effort without tachypnea nor retractions. Breath sounds are clear and equal bilaterally. No wheezes/rales/rhonchi. Gastrointestinal:   Soft and nontender. Non distended. There is no CVA tenderness.  No rebound, rigidity, or guarding. Genitourinary:   deferred Musculoskeletal:   Normal range of motion in all extremities. No joint effusions. Tenderness over the left hip at the greater trochanter..  No edema. Neurologic:   Normal speech and language.  CN 2-10 normal. Motor grossly intact. No gross focal neurologic deficits are appreciated.  Skin:    Skin is warm, dry and intact. No rash noted.  No petechiae, purpura, or bullae.  ____________________________________________    LABS (pertinent positives/negatives) (all labs ordered are listed, but only abnormal results are displayed) Labs Reviewed  BASIC METABOLIC PANEL - Abnormal; Notable for the following:       Result Value   Potassium 5.2 (*)    Chloride 99 (*)    BUN 47 (*)    Creatinine, Ser 10.28 (*)    Calcium 8.8 (*)    GFR calc non Af Amer 4 (*)    GFR calc Af Amer 4 (*)    All other components within normal limits  CBC - Abnormal; Notable  for the following:    RBC 3.75 (*)    Hemoglobin 10.8  (*)    HCT 33.0 (*)    RDW 19.4 (*)    All other components within normal limits  URINALYSIS, COMPLETE (UACMP) WITH MICROSCOPIC - Abnormal; Notable for the following:    Color, Urine YELLOW (*)    APPearance CLOUDY (*)    pH 9.0 (*)    Glucose, UA 150 (*)    Protein, ur >=300 (*)    Leukocytes, UA SMALL (*)    Bacteria, UA RARE (*)    Squamous Epithelial / LPF 6-30 (*)    All other components within normal limits  URINE CULTURE   ____________________________________________   EKG  Interpreted by me Normal sinus rhythm rate of 77, normal axis intervals. Poor R-wave progression in anterior precordial leads. Voltage criteria for LVH with associated T-wave inversion in aVL which is nonspecific.  ____________________________________________    RADIOLOGY  Ct Head Wo Contrast  Result Date: 10/16/2016 CLINICAL DATA:  Dizziness starting today, right temporal headache EXAM: CT HEAD WITHOUT CONTRAST TECHNIQUE: Contiguous axial images were obtained from the base of the skull through the vertex without intravenous contrast. COMPARISON:  08/20/2016 FINDINGS: Brain: No intracranial hemorrhage, mass effect or midline shift. No acute cortical infarction. No mass lesion is noted on this unenhanced scan. Stable mild cerebral atrophy. Stable patchy subcortical chronic white matter disease. Vascular: No hyperdense vessel or unexpected calcification. Skull: Normal. Negative for fracture or focal lesion. Sinuses/Orbits: No acute finding. Other: None. IMPRESSION: No acute intracranial abnormality. No acute cortical infarction. Stable mild periventricular screw stress that stable patchy subcortical chronic white matter disease. Electronically Signed   By: Lahoma Crocker M.D.   On: 10/16/2016 16:59   Dg Hip Unilat W Or Wo Pelvis 2-3 Views Left  Result Date: 10/16/2016 CLINICAL DATA:  Left hip pain after fall today. EXAM: DG HIP (WITH OR WITHOUT PELVIS) 2-3V LEFT COMPARISON:  None. FINDINGS: There is no evidence  of hip fracture or dislocation. There is no evidence of arthropathy or other focal bone abnormality. IMPRESSION: Normal left hip. Electronically Signed   By: Marijo Conception, M.D.   On: 10/16/2016 16:56    ____________________________________________   PROCEDURES Procedures  ____________________________________________   INITIAL IMPRESSION / ASSESSMENT AND PLAN / ED COURSE  Pertinent labs & imaging results that were available during my care of the patient were reviewed by me and considered in my medical decision making (see chart for details).  Patient well appearing no acute distress, complains of headache and dizziness.Considering the patient's symptoms, medical history, and physical examination today, I have low suspicion for ischemic stroke, intracranial hemorrhage, meningitis, encephalitis, carotid or vertebral dissection, venous sinus thrombosis, MS, intracranial hypertension, glaucoma, CRAO, CRVO, or temporal arteritis.  Likely this is what of her recurrent headache pattern. However with a history of recent blunt head trauma about 4-5 days ago and the fall and hip pain, we'll get x-ray of the left hip and CT head. Migraine cocktail. Plan to start antibiotics for urinary tract infection, although this may be related to chronic colonization. Urine culture sent.  Patient care discussed with Dr. Candiss Norse in the ED, has arranged for the patient to make of dialysis session tomorrow at 6 AM.    ----------------------------------------- 5:37 PM on 10/16/2016 -----------------------------------------  Workup negative. Patient feeling better. Counseled to take all her medications including her clonidine for her uncontrolled severe blood pressure.     ____________________________________________   FINAL CLINICAL IMPRESSION(S) / ED  DIAGNOSES  Final diagnoses:  Dizziness  Migraine with status migrainosus, not intractable, unspecified migraine type      New Prescriptions    CEFDINIR (OMNICEF) 300 MG CAPSULE    Take 1 capsule (300 mg total) by mouth 2 (two) times daily.   DIPHENHYDRAMINE (BENADRYL) 25 MG CAPSULE    Take 2 capsules (50 mg total) by mouth every 6 (six) hours as needed.   METOCLOPRAMIDE (REGLAN) 10 MG TABLET    Take 1 tablet (10 mg total) by mouth every 6 (six) hours as needed.     Portions of this note were generated with dragon dictation software. Dictation errors may occur despite best attempts at proofreading.    Carrie Mew, MD 10/16/16 [phone]

## 2016-10-16 NOTE — Discharge Instructions (Signed)
Dr. Candiss Norse scheduled a make-up appointment for you at dialysis at [facility] tomorrow.

## 2016-10-16 NOTE — ED Triage Notes (Signed)
Patient presents to ED via POV in wheelchair from home with c/o dizziness that began today. Patient states, "It feels like I'm riding the waves".  A&O x4. Pressured speech noted in triage. Dialysis patient, last treatment Wednesday. Patient has an appointment today at [place].

## 2016-10-16 NOTE — ED Notes (Signed)
Pt to call ride from lobby.

## 2016-10-17 ENCOUNTER — Emergency Department
Admission: EM | Admit: 2016-10-17 | Discharge: 2016-10-17 | Disposition: A | Discharge status: Home / Self Care | Payer: Medicare Other | Attending: Emergency Medicine | Admitting: Emergency Medicine

## 2016-10-17 ENCOUNTER — Encounter: Payer: Self-pay | Admitting: Emergency Medicine

## 2016-10-17 ENCOUNTER — Emergency Department: Payer: Medicare Other

## 2016-10-17 DIAGNOSIS — Z7984 Long term (current) use of oral hypoglycemic drugs: Secondary | ICD-10-CM | POA: Diagnosis not present

## 2016-10-17 DIAGNOSIS — T148XXA Other injury of unspecified body region, initial encounter: Secondary | ICD-10-CM

## 2016-10-17 DIAGNOSIS — J45909 Unspecified asthma, uncomplicated: Secondary | ICD-10-CM | POA: Insufficient documentation

## 2016-10-17 DIAGNOSIS — Z87891 Personal history of nicotine dependence: Secondary | ICD-10-CM | POA: Diagnosis not present

## 2016-10-17 DIAGNOSIS — Y999 Unspecified external cause status: Secondary | ICD-10-CM | POA: Diagnosis not present

## 2016-10-17 DIAGNOSIS — M545 Low back pain: Secondary | ICD-10-CM | POA: Diagnosis not present

## 2016-10-17 DIAGNOSIS — N186 End stage renal disease: Secondary | ICD-10-CM | POA: Insufficient documentation

## 2016-10-17 DIAGNOSIS — Z7982 Long term (current) use of aspirin: Secondary | ICD-10-CM | POA: Insufficient documentation

## 2016-10-17 DIAGNOSIS — I5033 Acute on chronic diastolic (congestive) heart failure: Secondary | ICD-10-CM | POA: Diagnosis not present

## 2016-10-17 DIAGNOSIS — E1122 Type 2 diabetes mellitus with diabetic chronic kidney disease: Secondary | ICD-10-CM | POA: Insufficient documentation

## 2016-10-17 DIAGNOSIS — M542 Cervicalgia: Secondary | ICD-10-CM | POA: Insufficient documentation

## 2016-10-17 DIAGNOSIS — Y9389 Activity, other specified: Secondary | ICD-10-CM | POA: Diagnosis not present

## 2016-10-17 DIAGNOSIS — S0990XA Unspecified injury of head, initial encounter: Secondary | ICD-10-CM | POA: Diagnosis present

## 2016-10-17 DIAGNOSIS — I132 Hypertensive heart and chronic kidney disease with heart failure and with stage 5 chronic kidney disease, or end stage renal disease: Secondary | ICD-10-CM | POA: Diagnosis not present

## 2016-10-17 DIAGNOSIS — Y92410 Unspecified street and highway as the place of occurrence of the external cause: Secondary | ICD-10-CM | POA: Insufficient documentation

## 2016-10-17 LAB — URINE CULTURE: Culture: <10000 — AB

## 2016-10-17 MED ORDER — DIAZEPAM 5 MG PO TABS
5.0000 mg | ORAL_TABLET | Freq: Once | ORAL | Status: AC
  Administered 2016-10-17: 5 mg via ORAL
  Filled 2016-10-17: qty 1

## 2016-10-17 NOTE — ED Notes (Signed)
2 blood draw attempts by this RN unsuccessfully (patient is a dialysis patient, unable to use right arm for draws). Spoke to Dr. Jimmye Norman about patient's condition and complaints. Patient to wait till she sees MD to have Xrays, possibly CT d/t several recent CT's and several areas of the body as complaints of pain. C-collar applied in triage.

## 2016-10-17 NOTE — ED Notes (Signed)
Patient left for imaging. 
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
## 2016-10-21 ENCOUNTER — Ambulatory Visit: Payer: Medicare Other | Admitting: Family

## 2016-10-26 ENCOUNTER — Other Ambulatory Visit: Payer: Self-pay | Admitting: *Deleted

## 2016-10-26 ENCOUNTER — Inpatient Hospital Stay
Admission: RE | Admit: 2016-10-26 | Discharge: 2016-10-26 | Disposition: A | Discharge status: Home / Self Care | Payer: Self-pay | Source: Ambulatory Visit | Attending: *Deleted | Admitting: *Deleted

## 2016-10-26 DIAGNOSIS — Z1231 Encounter for screening mammogram for malignant neoplasm of breast: Secondary | ICD-10-CM

## 2016-10-26 DIAGNOSIS — R0602 Shortness of breath: Secondary | ICD-10-CM | POA: Insufficient documentation

## 2016-10-27 ENCOUNTER — Telehealth: Payer: Self-pay | Admitting: Family

## 2016-10-27 ENCOUNTER — Ambulatory Visit: Payer: Medicare Other | Admitting: Family

## 2016-10-27 NOTE — Telephone Encounter (Signed)
Patient did not show for her [facility] appointment on 10/27/16. Will attempt to reschedule.

## 2016-11-03 ENCOUNTER — Other Ambulatory Visit: Payer: Self-pay | Admitting: Family Medicine

## 2016-11-03 DIAGNOSIS — R928 Other abnormal and inconclusive findings on diagnostic imaging of breast: Secondary | ICD-10-CM

## 2016-11-03 DIAGNOSIS — N6310 Unspecified lump in the right breast, unspecified quadrant: Secondary | ICD-10-CM

## 2016-11-10 ENCOUNTER — Encounter: Payer: Self-pay | Admitting: *Deleted

## 2016-11-10 ENCOUNTER — Emergency Department: Payer: Medicare Other

## 2016-11-10 ENCOUNTER — Inpatient Hospital Stay
Admission: EM | Admit: 2016-11-10 | Discharge: 2016-11-11 | DRG: 291 | Disposition: A | Discharge status: Home / Self Care | Payer: Medicare Other | Attending: Internal Medicine | Admitting: Internal Medicine

## 2016-11-10 ENCOUNTER — Inpatient Hospital Stay (HOSPITAL_COMMUNITY)
Admit: 2016-11-10 | Discharge: 2016-11-10 | Disposition: A | Discharge status: Home / Self Care | Payer: Medicare Other | Attending: Family Medicine | Admitting: Family Medicine

## 2016-11-10 DIAGNOSIS — Z91018 Allergy to other foods: Secondary | ICD-10-CM | POA: Diagnosis not present

## 2016-11-10 DIAGNOSIS — I509 Heart failure, unspecified: Secondary | ICD-10-CM

## 2016-11-10 DIAGNOSIS — N2581 Secondary hyperparathyroidism of renal origin: Secondary | ICD-10-CM | POA: Diagnosis present

## 2016-11-10 DIAGNOSIS — Z79899 Other long term (current) drug therapy: Secondary | ICD-10-CM | POA: Diagnosis not present

## 2016-11-10 DIAGNOSIS — K219 Gastro-esophageal reflux disease without esophagitis: Secondary | ICD-10-CM | POA: Diagnosis present

## 2016-11-10 DIAGNOSIS — Z885 Allergy status to narcotic agent status: Secondary | ICD-10-CM | POA: Diagnosis not present

## 2016-11-10 DIAGNOSIS — I132 Hypertensive heart and chronic kidney disease with heart failure and with stage 5 chronic kidney disease, or end stage renal disease: Secondary | ICD-10-CM | POA: Diagnosis present

## 2016-11-10 DIAGNOSIS — Z8249 Family history of ischemic heart disease and other diseases of the circulatory system: Secondary | ICD-10-CM

## 2016-11-10 DIAGNOSIS — Z888 Allergy status to other drugs, medicaments and biological substances status: Secondary | ICD-10-CM

## 2016-11-10 DIAGNOSIS — D631 Anemia in chronic kidney disease: Secondary | ICD-10-CM | POA: Diagnosis present

## 2016-11-10 DIAGNOSIS — J9621 Acute and chronic respiratory failure with hypoxia: Secondary | ICD-10-CM | POA: Diagnosis present

## 2016-11-10 DIAGNOSIS — E1142 Type 2 diabetes mellitus with diabetic polyneuropathy: Secondary | ICD-10-CM | POA: Diagnosis present

## 2016-11-10 DIAGNOSIS — J810 Acute pulmonary edema: Secondary | ICD-10-CM

## 2016-11-10 DIAGNOSIS — Q613 Polycystic kidney, unspecified: Secondary | ICD-10-CM

## 2016-11-10 DIAGNOSIS — I5033 Acute on chronic diastolic (congestive) heart failure: Secondary | ICD-10-CM | POA: Diagnosis present

## 2016-11-10 DIAGNOSIS — Z9981 Dependence on supplemental oxygen: Secondary | ICD-10-CM

## 2016-11-10 DIAGNOSIS — E785 Hyperlipidemia, unspecified: Secondary | ICD-10-CM | POA: Diagnosis present

## 2016-11-10 DIAGNOSIS — I2510 Atherosclerotic heart disease of native coronary artery without angina pectoris: Secondary | ICD-10-CM | POA: Diagnosis present

## 2016-11-10 DIAGNOSIS — J441 Chronic obstructive pulmonary disease with (acute) exacerbation: Secondary | ICD-10-CM | POA: Diagnosis present

## 2016-11-10 DIAGNOSIS — I248 Other forms of acute ischemic heart disease: Secondary | ICD-10-CM | POA: Diagnosis present

## 2016-11-10 DIAGNOSIS — N186 End stage renal disease: Secondary | ICD-10-CM | POA: Diagnosis present

## 2016-11-10 DIAGNOSIS — E1122 Type 2 diabetes mellitus with diabetic chronic kidney disease: Secondary | ICD-10-CM | POA: Diagnosis present

## 2016-11-10 DIAGNOSIS — Z992 Dependence on renal dialysis: Secondary | ICD-10-CM | POA: Diagnosis not present

## 2016-11-10 DIAGNOSIS — Z7982 Long term (current) use of aspirin: Secondary | ICD-10-CM | POA: Diagnosis not present

## 2016-11-10 DIAGNOSIS — Z87891 Personal history of nicotine dependence: Secondary | ICD-10-CM

## 2016-11-10 DIAGNOSIS — J9601 Acute respiratory failure with hypoxia: Secondary | ICD-10-CM | POA: Diagnosis present

## 2016-11-10 DIAGNOSIS — R0602 Shortness of breath: Secondary | ICD-10-CM | POA: Diagnosis present

## 2016-11-10 DIAGNOSIS — R0902 Hypoxemia: Secondary | ICD-10-CM | POA: Diagnosis present

## 2016-11-10 LAB — CBC WITH DIFFERENTIAL/PLATELET
BASOS ABS: 0.2 10*3/uL — AB (ref 0–0.1)
Basophils Relative: 1 %
Eosinophils Absolute: 1 10*3/uL — ABNORMAL HIGH (ref 0–0.7)
Eosinophils Relative: 6 %
HCT: 26.1 % — ABNORMAL LOW (ref 35.0–47.0)
Hemoglobin: 8.4 g/dL — ABNORMAL LOW (ref 12.0–16.0)
LYMPHS ABS: 0.9 10*3/uL — AB (ref 1.0–3.6)
LYMPHS PCT: 6 %
MCH: 28.5 pg (ref 26.0–34.0)
MCHC: 32.3 g/dL (ref 32.0–36.0)
MCV: 88.3 fL (ref 80.0–100.0)
MONO ABS: 0.7 10*3/uL (ref 0.2–0.9)
Monocytes Relative: 4 %
NEUTROS ABS: 12.4 10*3/uL — AB (ref 1.4–6.5)
Neutrophils Relative %: 83 %
PLATELETS: 472 10*3/uL — AB (ref 150–440)
RBC: 2.96 MIL/uL — AB (ref 3.80–5.20)
RDW: 19.3 % — AB (ref 11.5–14.5)
WBC: 15 10*3/uL — ABNORMAL HIGH (ref 3.6–11.0)

## 2016-11-10 LAB — COMPREHENSIVE METABOLIC PANEL
ALT: 10 U/L — AB (ref 14–54)
AST: 16 U/L (ref 15–41)
Albumin: 3.1 g/dL — ABNORMAL LOW (ref 3.5–5.0)
Alkaline Phosphatase: 60 U/L (ref 38–126)
Anion gap: 16 — ABNORMAL HIGH (ref 5–15)
BUN: 56 mg/dL — AB (ref 6–20)
CHLORIDE: 96 mmol/L — AB (ref 101–111)
CO2: 26 mmol/L (ref 22–32)
CREATININE: 13 mg/dL — AB (ref 0.44–1.00)
Calcium: 9.1 mg/dL (ref 8.9–10.3)
GFR calc Af Amer: 3 mL/min — ABNORMAL LOW (ref >60)
GFR, EST NON AFRICAN AMERICAN: 3 mL/min — AB (ref >60)
Glucose, Bld: 99 mg/dL (ref 65–99)
POTASSIUM: 5 mmol/L (ref 3.5–5.1)
SODIUM: 138 mmol/L (ref 135–145)
Total Bilirubin: 0.7 mg/dL (ref 0.3–1.2)
Total Protein: 8.8 g/dL — ABNORMAL HIGH (ref 6.5–8.1)

## 2016-11-10 LAB — TROPONIN I
TROPONIN I: 0.06 ng/mL — AB (ref <0.03)
TROPONIN I: 0.06 ng/mL — AB (ref <0.03)
Troponin I: 0.04 ng/mL (ref <0.03)

## 2016-11-10 LAB — GLUCOSE, CAPILLARY
GLUCOSE-CAPILLARY: 105 mg/dL — AB (ref 65–99)
GLUCOSE-CAPILLARY: 154 mg/dL — AB (ref 65–99)
Glucose-Capillary: 148 mg/dL — ABNORMAL HIGH (ref 65–99)
Glucose-Capillary: 152 mg/dL — ABNORMAL HIGH (ref 65–99)

## 2016-11-10 LAB — LIPID PANEL
CHOL/HDL RATIO: 4.5 ratio
CHOLESTEROL: 161 mg/dL (ref 0–200)
HDL: 36 mg/dL — AB (ref >40)
LDL Cholesterol: 92 mg/dL (ref 0–99)
TRIGLYCERIDES: 165 mg/dL — AB (ref <150)
VLDL: 33 mg/dL (ref 0–40)

## 2016-11-10 LAB — PHOSPHORUS: PHOSPHORUS: 3.7 mg/dL (ref 2.5–4.6)

## 2016-11-10 LAB — INFLUENZA PANEL BY PCR (TYPE A & B)
INFLAPCR: NEGATIVE
INFLBPCR: NEGATIVE

## 2016-11-10 LAB — MRSA PCR SCREENING: MRSA by PCR: NEGATIVE

## 2016-11-10 LAB — TSH: TSH: 1.838 u[IU]/mL (ref 0.350–4.500)

## 2016-11-10 LAB — BRAIN NATRIURETIC PEPTIDE: B NATRIURETIC PEPTIDE 5: 987 pg/mL — AB (ref 0.0–100.0)

## 2016-11-10 MED ORDER — IPRATROPIUM-ALBUTEROL 0.5-2.5 (3) MG/3ML IN SOLN
3.0000 mL | Freq: Once | RESPIRATORY_TRACT | Status: AC
  Administered 2016-11-10: 3 mL via RESPIRATORY_TRACT
  Filled 2016-11-10: qty 3

## 2016-11-10 MED ORDER — SODIUM CHLORIDE 0.9 % IV SOLN: 100.0000 mL | INTRAVENOUS | Status: DC | PRN

## 2016-11-10 MED ORDER — ACETAMINOPHEN 325 MG PO TABS
650.0000 mg | ORAL_TABLET | Freq: Four times a day (QID) | ORAL | Status: DC | PRN
  Administered 2016-11-10: 650 mg via ORAL
  Filled 2016-11-10: qty 2

## 2016-11-10 MED ORDER — INSULIN ASPART 100 UNIT/ML ~~LOC~~ SOLN
0.0000 [IU] | Freq: Three times a day (TID) | SUBCUTANEOUS | Status: DC
  Administered 2016-11-10: 2 [IU] via SUBCUTANEOUS
  Administered 2016-11-10: 3 [IU] via SUBCUTANEOUS
  Administered 2016-11-11 (×2): 2 [IU] via SUBCUTANEOUS
  Filled 2016-11-10: qty 3
  Filled 2016-11-10 (×3): qty 2

## 2016-11-10 MED ORDER — NITROGLYCERIN 0.4 MG SL SUBL
0.4000 mg | SUBLINGUAL_TABLET | SUBLINGUAL | Status: DC | PRN
  Administered 2016-11-10: 0.4 mg via SUBLINGUAL
  Filled 2016-11-10: qty 1

## 2016-11-10 MED ORDER — GUAIFENESIN ER 600 MG PO TB12
600.0000 mg | ORAL_TABLET | Freq: Two times a day (BID) | ORAL | Status: DC
  Administered 2016-11-10 – 2016-11-11 (×3): 600 mg via ORAL
  Filled 2016-11-10 (×3): qty 1

## 2016-11-10 MED ORDER — DEXTROMETHORPHAN POLISTIREX ER 30 MG/5ML PO SUER
30.0000 mg | Freq: Two times a day (BID) | ORAL | Status: DC
  Administered 2016-11-10 – 2016-11-11 (×3): 30 mg via ORAL
  Filled 2016-11-10 (×5): qty 5

## 2016-11-10 MED ORDER — HEPARIN SODIUM (PORCINE) 1000 UNIT/ML DIALYSIS
1000.0000 [IU] | INTRAMUSCULAR | Status: DC | PRN
  Filled 2016-11-10: qty 1

## 2016-11-10 MED ORDER — CLONIDINE HCL 0.1 MG PO TABS
0.1000 mg | ORAL_TABLET | Freq: Two times a day (BID) | ORAL | Status: DC
  Administered 2016-11-10 – 2016-11-11 (×2): 0.1 mg via ORAL
  Filled 2016-11-10 (×2): qty 1

## 2016-11-10 MED ORDER — FUROSEMIDE 10 MG/ML IJ SOLN
20.0000 mg | Freq: Once | INTRAMUSCULAR | Status: AC
  Administered 2016-11-10: 20 mg via INTRAVENOUS
  Filled 2016-11-10: qty 4

## 2016-11-10 MED ORDER — NITROGLYCERIN 2 % TD OINT
1.0000 [in_us] | TOPICAL_OINTMENT | Freq: Once | TRANSDERMAL | Status: AC
  Administered 2016-11-10: 1 [in_us] via TOPICAL
  Filled 2016-11-10: qty 1

## 2016-11-10 MED ORDER — LIDOCAINE HCL (PF) 1 % IJ SOLN
5.0000 mL | INTRAMUSCULAR | Status: DC | PRN
  Filled 2016-11-10: qty 5

## 2016-11-10 MED ORDER — SODIUM CHLORIDE 0.9 % IV SOLN
250.0000 mL | INTRAVENOUS | Status: DC | PRN
Start: 2016-11-10 — End: 2016-11-11

## 2016-11-10 MED ORDER — METOPROLOL TARTRATE 50 MG PO TABS
50.0000 mg | ORAL_TABLET | Freq: Two times a day (BID) | ORAL | Status: DC
Start: 2016-11-10 — End: 2016-11-11
  Administered 2016-11-10 – 2016-11-11 (×2): 50 mg via ORAL
  Filled 2016-11-10 (×2): qty 1

## 2016-11-10 MED ORDER — ALBUTEROL SULFATE HFA 108 (90 BASE) MCG/ACT IN AERS: 2.0000 | INHALATION_SPRAY | Freq: Four times a day (QID) | RESPIRATORY_TRACT | Status: DC | PRN

## 2016-11-10 MED ORDER — IPRATROPIUM-ALBUTEROL 0.5-2.5 (3) MG/3ML IN SOLN
3.0000 mL | Freq: Four times a day (QID) | RESPIRATORY_TRACT | Status: DC | PRN
Start: 2016-11-10 — End: 2016-11-11

## 2016-11-10 MED ORDER — SODIUM CHLORIDE 0.9% FLUSH
3.0000 mL | Freq: Two times a day (BID) | INTRAVENOUS | Status: DC
  Administered 2016-11-10 – 2016-11-11 (×2): 3 mL via INTRAVENOUS

## 2016-11-10 MED ORDER — ALTEPLASE 2 MG IJ SOLR: 2.0000 mg | Freq: Once | INTRAMUSCULAR | Status: DC | PRN

## 2016-11-10 MED ORDER — LIDOCAINE-PRILOCAINE 2.5-2.5 % EX CREA
1.0000 "application " | TOPICAL_CREAM | CUTANEOUS | Status: DC | PRN
  Filled 2016-11-10: qty 5

## 2016-11-10 MED ORDER — ASPIRIN 81 MG PO CHEW
81.0000 mg | CHEWABLE_TABLET | Freq: Every day | ORAL | Status: DC
  Administered 2016-11-10 – 2016-11-11 (×2): 81 mg via ORAL
  Filled 2016-11-10 (×2): qty 1

## 2016-11-10 MED ORDER — ONDANSETRON HCL 4 MG/2ML IJ SOLN: 4.0000 mg | Freq: Four times a day (QID) | INTRAMUSCULAR | Status: DC | PRN

## 2016-11-10 MED ORDER — HEPARIN SODIUM (PORCINE) 5000 UNIT/ML IJ SOLN
5000.0000 [IU] | Freq: Three times a day (TID) | INTRAMUSCULAR | Status: DC
  Administered 2016-11-10 – 2016-11-11 (×2): 5000 [IU] via SUBCUTANEOUS
  Filled 2016-11-10 (×2): qty 1

## 2016-11-10 MED ORDER — CINACALCET HCL 30 MG PO TABS
30.0000 mg | ORAL_TABLET | Freq: Every day | ORAL | Status: DC
  Administered 2016-11-10 – 2016-11-11 (×2): 30 mg via ORAL
  Filled 2016-11-10 (×2): qty 1

## 2016-11-10 MED ORDER — HYDRALAZINE HCL 50 MG PO TABS
50.0000 mg | ORAL_TABLET | Freq: Three times a day (TID) | ORAL | Status: DC
  Administered 2016-11-10 – 2016-11-11 (×2): 50 mg via ORAL
  Filled 2016-11-10 (×2): qty 1

## 2016-11-10 MED ORDER — EPOETIN ALFA 10000 UNIT/ML IJ SOLN
10000.0000 [IU] | INTRAMUSCULAR | Status: DC
Start: 2016-11-12 — End: 2016-11-11
  Administered 2016-11-10: 10000 [IU] via INTRAVENOUS
  Filled 2016-11-10: qty 1

## 2016-11-10 MED ORDER — PENTAFLUOROPROP-TETRAFLUOROETH EX AERO
1.0000 "application " | INHALATION_SPRAY | CUTANEOUS | Status: DC | PRN
  Filled 2016-11-10: qty 30

## 2016-11-10 MED ORDER — LIDOCAINE HCL (PF) 1 % IJ SOLN
5.0000 mL | Freq: Once | INTRAMUSCULAR | Status: AC
  Administered 2016-11-10: 5 mL
  Filled 2016-11-10: qty 5

## 2016-11-10 MED ORDER — GABAPENTIN 300 MG PO CAPS
300.0000 mg | ORAL_CAPSULE | Freq: Two times a day (BID) | ORAL | Status: DC
  Administered 2016-11-10 – 2016-11-11 (×3): 300 mg via ORAL
  Filled 2016-11-10 (×3): qty 1

## 2016-11-10 MED ORDER — DM-GUAIFENESIN ER 30-600 MG PO TB12: 1.0000 | ORAL_TABLET | Freq: Two times a day (BID) | ORAL | Status: DC

## 2016-11-10 MED ORDER — SODIUM CHLORIDE 0.9% FLUSH: 3.0000 mL | INTRAVENOUS | Status: DC | PRN

## 2016-11-10 MED ORDER — PREDNISONE 20 MG PO TABS
60.0000 mg | ORAL_TABLET | Freq: Once | ORAL | Status: AC
  Administered 2016-11-10: 60 mg via ORAL
  Filled 2016-11-10: qty 3

## 2016-11-10 MED ORDER — FUROSEMIDE 80 MG PO TABS
80.0000 mg | ORAL_TABLET | Freq: Every day | ORAL | Status: DC
  Administered 2016-11-11: 80 mg via ORAL
  Filled 2016-11-10: qty 1

## 2016-11-10 MED ORDER — AMLODIPINE BESYLATE 5 MG PO TABS
5.0000 mg | ORAL_TABLET | Freq: Every day | ORAL | Status: DC
  Administered 2016-11-11: 5 mg via ORAL
  Filled 2016-11-10: qty 1

## 2016-11-10 MED ORDER — METHYLPREDNISOLONE SODIUM SUCC 40 MG IJ SOLR
40.0000 mg | Freq: Two times a day (BID) | INTRAMUSCULAR | Status: DC
  Administered 2016-11-10 – 2016-11-11 (×2): 40 mg via INTRAVENOUS
  Filled 2016-11-10 (×3): qty 1

## 2016-11-10 MED ORDER — INSULIN ASPART 100 UNIT/ML ~~LOC~~ SOLN: 0.0000 [IU] | Freq: Every day | SUBCUTANEOUS | Status: DC

## 2016-11-10 MED ORDER — CALCIUM ACETATE (PHOS BINDER) 667 MG PO CAPS: 667.0000 mg | ORAL_CAPSULE | Freq: Three times a day (TID) | ORAL | Status: DC

## 2016-11-10 MED ORDER — CALCIUM ACETATE (PHOS BINDER) 667 MG PO CAPS
2001.0000 mg | ORAL_CAPSULE | Freq: Three times a day (TID) | ORAL | Status: DC
  Administered 2016-11-10 – 2016-11-11 (×3): 2001 mg via ORAL
  Filled 2016-11-10 (×3): qty 3

## 2016-11-10 MED ORDER — DEXTROSE 5 % IV SOLN
500.0000 mg | INTRAVENOUS | Status: DC
  Administered 2016-11-10: 500 mg via INTRAVENOUS
  Filled 2016-11-10 (×3): qty 500

## 2016-11-10 NOTE — Progress Notes (Signed)
Notified Dr. Benjie Karvonen of critical troponin of.06 no new orders received will continue to monitor.

## 2016-11-10 NOTE — ED Provider Notes (Signed)
[facility] Emergency Department Provider Note  ____________________________________________   First MD Initiated Contact with Patient 11/10/16 0507     (approximate)  I have reviewed the triage vital signs and the nursing notes.   HISTORY  Chief Complaint Cough    HPI Jasmine Holloway is a 56 y.o. female who comes to the emergency department via EMS for 2 days of cough. She has a complex past medical history including congestive heart failure, COPD, diabetes mellitus, end-stage renal disease requiring dialysis, and hypertension. She still urinates. She is scheduled for dialysis this morning via a fistula to her right upper extremity. She says that for the past 2 days she has had a low-grade fever at home and a persistent dry cough. This morning while and that her coughing was so severe it did not allow her to sleep so she called 911.Nothing makes the cough better or worse.   Past Medical History:  Diagnosis Date  . Asthma   . CHF (congestive heart failure) ([place])   . COPD (chronic obstructive pulmonary disease) ([place])   . Diabetes mellitus without complication ([place])   . Diastolic heart failure ([place])   . ESRD (end stage renal disease) on dialysis ([place])   . Hypertension   . Neuropathy ([place])   . Polycystic kidney disease   . Renal insufficiency     Patient Active Problem List   Diagnosis Date Noted  . Acute respiratory failure with hypoxia ([place]) 11/10/2016  . Acute pulmonary edema ([place]) 07/04/2016  . Acute respiratory failure ([place]) 06/09/2016  . Atypical chest pain 06/08/2016  . Chronic pain syndrome 06/08/2016  . Anemia in stage 4 chronic kidney disease ([place]) 06/08/2016  . CHF exacerbation ([place]) 04/28/2016  . Epigastric pain 03/28/2016  . Acute CHF (congestive heart failure) ([place]) 03/28/2016  . CHF (congestive heart failure) ([place]) 03/28/2016  . Chronic diastolic heart failure ([place]) 02/04/2016  . Hoarseness of voice 02/04/2016  . Acute on chronic  diastolic CHF (congestive heart failure) ([place]) 12/22/2015  . ESRD on dialysis ([place]) 12/22/2015  . Essential hypertension 12/22/2015  . Hyperlipidemia 12/22/2015    Past Surgical History:  Procedure Laterality Date  . ARTERIOVENOUS GRAFT PLACEMENT Right    x3 (R forearm currently used for access)  . BREAST BIOPSY Left 12/03/2015   neg  . COLON SURGERY    . PERIPHERAL VASCULAR CATHETERIZATION Right 08/04/2016   Procedure: A/V Shuntogram;  Surgeon: Algernon Huxley, MD;  Location: [place] [facility];  Service: Cardiovascular;  Laterality: Right;  . VEIN HARVEST      Prior to Admission medications   Medication Sig Start Date End Date Taking? Authorizing Provider  acetaminophen (TYLENOL) 325 MG tablet Take 2 tablets (650 mg total) by mouth every 6 (six) hours as needed for mild pain or headache. 03/18/16   Nicholes Mango, MD  albuterol-ipratropium (COMBIVENT) 18-103 MCG/ACT inhaler Inhale 2 puffs into the lungs daily as needed for wheezing or shortness of breath.    Historical Provider, MD  aspirin 81 MG chewable tablet Chew 1 tablet (81 mg total) by mouth daily. 01/17/16   Gladstone Lighter, MD  b complex-vitamin c-folic acid (NEPHRO-VITE) 0.8 MG TABS tablet Take 1 tablet by mouth at bedtime.    Historical Provider, MD  baclofen (LIORESAL) 10 MG tablet Take 5 mg by mouth 2 (two) times daily.    Historical Provider, MD  calcium acetate (PHOSLO) 667 MG capsule Take 3 capsules (2,001 mg total) by mouth 3 (three) times daily with meals. Patient  taking differently: Take 667 mg by mouth 3 (three) times daily with meals.  07/07/16   Fritzi Mandes, MD  cefdinir (OMNICEF) 300 MG capsule Take 1 capsule (300 mg total) by mouth 2 (two) times daily. 10/16/16   Carrie Mew, MD  cloNIDine (CATAPRES) 0.1 MG tablet Take 1 tablet (0.1 mg total) by mouth 2 (two) times daily. 01/17/16   Gladstone Lighter, MD  cloNIDine (CATAPRES) 0.1 MG tablet Take 0.1 mg by mouth as needed. Prn diastolic >132 and/or systolic B/P >440     Historical Provider, MD  diphenhydrAMINE (BENADRYL) 25 mg capsule Take 2 capsules (50 mg total) by mouth every 6 (six) hours as needed. 10/16/16   Carrie Mew, MD  Doxercalciferol (HECTOROL) 1 MCG CAPS Take 3.5 mcg by mouth 3 (three) times a week.    Historical Provider, MD  epoetin alfa (EPOGEN) 3000 UNIT/ML injection Inject 6,000 Units into the vein 3 (three) times a week.    Historical Provider, MD  furosemide (LASIX) 80 MG tablet Take 1 tablet (80 mg total) by mouth daily. 03/05/16 08/18/16  Alisa Graff, FNP  gabapentin (NEURONTIN) 300 MG capsule Take 300 mg by mouth 2 (two) times daily.     Historical Provider, MD  irbesartan (AVAPRO) 150 MG tablet Take 1 tablet by mouth daily. 05/20/16   Historical Provider, MD  iron sucrose (VENOFER) 20 MG/ML injection Inject 50 mg into the vein once a week.    Historical Provider, MD  LORazepam (ATIVAN) 1 MG tablet Take 1 tablet (1 mg total) by mouth 2 (two) times daily as needed for anxiety. Patient not taking: Reported on 08/18/2016 07/21/16   Daymon Larsen, MD  losartan (COZAAR) 25 MG tablet 25 mg daily.  05/12/16   Historical Provider, MD  metolazone (ZAROXOLYN) 5 MG tablet Take 1 tablet (5 mg total) by mouth daily as needed. Patient taking differently: Take 5 mg by mouth daily.  06/08/16 09/06/16  Minna Merritts, MD  metoprolol (LOPRESSOR) 50 MG tablet 50 mg 2 (two) times daily.  05/12/16   Historical Provider, MD  nortriptyline (PAMELOR) 25 MG capsule Take 25 mg by mouth at bedtime.    Historical Provider, MD  omeprazole (PRILOSEC) 40 MG capsule Take 40 mg by mouth daily.     Historical Provider, MD  ondansetron (ZOFRAN) 4 MG tablet Take 1 tablet (4 mg total) by mouth every 8 (eight) hours as needed for nausea or vomiting. Patient not taking: Reported on 08/18/2016 07/21/16   Daymon Larsen, MD  prochlorperazine (COMPAZINE) 10 MG tablet Take 1 tablet (10 mg total) by mouth every 6 (six) hours as needed for nausea. 10/15/16   Paulette Blanch, MD    promethazine (PHENERGAN) 25 MG tablet Take 1 tablet (25 mg total) by mouth every 6 (six) hours as needed for nausea or vomiting. 10/16/16   Carrie Mew, MD  rosuvastatin (CRESTOR) 5 MG tablet Take 1 tablet (5 mg total) by mouth at bedtime. 07/07/16   Fritzi Mandes, MD  SENSIPAR 30 MG tablet Take 1 tablet (30 mg total) by mouth daily. 07/07/16   Fritzi Mandes, MD  traMADol (ULTRAM) 50 MG tablet Take 1 tablet (50 mg total) by mouth every 6 (six) hours as needed. Patient not taking: Reported on 08/18/2016 07/21/16   Daymon Larsen, MD  VENTOLIN HFA 108 [phone] Base) MCG/ACT inhaler Inhale 2 puffs into the lungs daily as needed.  05/21/16   Historical Provider, MD    Allergies Morphine and related; Strawberry extract; Tramadol;  Venofer [iron sucrose]; Vicodin [hydrocodone-acetaminophen]; Buprenorphine hcl; and Codeine  Family History  Problem Relation Age of Onset  . Hypertension Brother   . Heart failure Brother   . Hypertension Mother   . Heart disease Mother     Social History Social History  Substance Use Topics  . Smoking status: Former Smoker    Packs/day: 0.25    Quit date: 08/11/2015  . Smokeless tobacco: Never Used  . Alcohol use No    Review of Systems Constitutional:Positive fevers Eyes: No visual changes. ENT: No sore throat. Cardiovascular: Denies chest pain. Respiratory: Positive cough and shortness of breath Gastrointestinal: No abdominal pain.  No nausea, no vomiting.  No diarrhea.  No constipation. Genitourinary: Negative for dysuria. Musculoskeletal: Negative for back pain. Skin: Negative for rash. Neurological: Negative for headaches, focal weakness or numbness.  10-point ROS otherwise negative.  ____________________________________________   PHYSICAL EXAM:  VITAL SIGNS: ED Triage Vitals  Enc Vitals Group     BP      Pulse      Resp      Temp      Temp src      SpO2      Weight      Height      Head Circumference      Peak Flow      Pain Score       Pain Loc      Pain Edu?      Excl. in [place]?     Constitutional: Alert and oriented x 4 Appears uncomfortable with consistent continual dry cough Eyes: PERRL EOMI. Head: Atraumatic. Nose: No congestion/rhinnorhea. Mouth/Throat: No trismus Neck: No stridor.   Cardiovascular: Normal rate, regular rhythm. Grossly normal heart sounds.  Good peripheral circulation. Respiratory: Taking shallow breaths that are interrupted by coughs. Mild wheezing throughout Gastrointestinal: Soft nondistended nontender no rebound no guarding no peritonitis no McBurney's tenderness negative Rovsing's no costovertebral tenderness negative Murphy's Musculoskeletal: No lower extremity edema fistula to the right upper extremity with good thrill Neurologic:  Normal speech and language. No gross focal neurologic deficits are appreciated. Skin:  Skin is warm, dry and intact. No rash noted. Psychiatric: Mood and affect are normal. Speech and behavior are normal.    ____________________________________________   DIFFERENTIAL  COPD exacerbation, CHF exacerbation, fluid overload secondary to end-stage renal disease, influenza, bronchitis ____________________________________________   LABS (all labs ordered are listed, but only abnormal results are displayed)  Labs Reviewed  INFLUENZA PANEL BY PCR (TYPE A & B)  COMPREHENSIVE METABOLIC PANEL  TROPONIN I  CBC WITH DIFFERENTIAL/PLATELET  BRAIN NATRIURETIC PEPTIDE  TROPONIN I  TROPONIN I  TROPONIN I  LIPID PANEL  TSH     __________________________________________  EKG  ED ECG REPORT I, Darel Hong, the attending physician, personally viewed and interpreted this ECG.  Date: 11/10/2016 Rate: 84 Rhythm: normal sinus rhythm QRS Axis: normal Intervals: normal ST/T Wave abnormalities: normal Conduction Disturbances: none Narrative Interpretation: unremarkable  ____________________________________________  RADIOLOGY  Chest x-ray consistent with  pulmonary edema ____________________________________________   PROCEDURES  Procedure(s) performed: no  Procedures  Critical Care performed: no  ____________________________________________   INITIAL IMPRESSION / ASSESSMENT AND PLAN / ED COURSE  Pertinent labs & imaging results that were available during my care of the patient were reviewed by me and considered in my medical decision making (see chart for details).  On arrival the patient is quite uncomfortable appearing with wheezy lungs. This may be secondary to COPD or fluid overload and CHF. Regardless  the cough is irritating her to the point where it is difficult to examine slight nebulized 4 cc of 1% lidocaine without epinephrine which immediately reduce the cough.    ----------------------------------------- 5:48 AM on 11/10/2016 -----------------------------------------  After breathing treatment the patient is still quite short of breath saturating 79-81% on room air. This still could be secondary to COPD versus pulmonary edema. I will give her additional breathing treatments as well as nitroglycerin for the edema. She will require dialysis today.  ____________________________________________   FINAL CLINICAL IMPRESSION(S) / ED DIAGNOSES  Final diagnoses:  Acute pulmonary edema ([place])      NEW MEDICATIONS STARTED DURING THIS VISIT:  New Prescriptions   No medications on file     Note:  This document was prepared using Dragon voice recognition software and may include unintentional dictation errors.     Darel Hong, MD 11/10/16 [phone]

## 2016-11-10 NOTE — Progress Notes (Signed)
[facility]  ROUNDING NOTE   Subjective:  Patient very well known to [name]. We follow her for outpatient hemodialysis. She presented with cough and shortness of breath. Chest x-ray revealed pulmonary edema. She is due for hemodialysis today.  Objective:  Vital signs in last 24 hours:  Temp:  [98.1 F (36.7 C)-99.2 F (37.3 C)] 98.1 F (36.7 C) (04/03 0832) Pulse Rate:  [51-100] 51 (04/03 0832) Resp:  [13-25] 22 (04/03 0832) BP: (146-179)/(68-104) 166/96 (04/03 0832) SpO2:  [89 %-100 %] 96 % (04/03 0832) Weight:  [81.8 kg (180 lb 4.8 oz)-82.1 kg (181 lb)] 81.8 kg (180 lb 4.8 oz) (04/03 0832)  Weight change:  Filed Weights   11/10/16 0508 11/10/16 0832  Weight: 82.1 kg (181 lb) 81.8 kg (180 lb 4.8 oz)    Intake/Output: No intake/output data recorded.   Intake/Output this shift:  Total I/O In: 240 [P.O.:240] Out: -   Physical Exam: General: No acute distress  Head: Normocephalic, atraumatic. Moist oral mucosal membranes  Eyes: Anicteric  Neck: Supple, trachea midline  Lungs:  Bilateral rhonchi, normal effort  Heart: S1S2 no rubs  Abdomen:  Soft, nontender,   Extremities: 1+ peripheral edema.  Neurologic: Nonfocal, moving all four extremities  Skin: No lesions  Access: RUE AVG    Basic Metabolic Panel:  Recent Labs Lab 11/10/16 0640  NA 138  K 5.0  CL 96*  CO2 26  GLUCOSE 99  BUN 56*  CREATININE 13.00*  CALCIUM 9.1    Liver Function Tests:  Recent Labs Lab 11/10/16 0640  AST 16  ALT 10*  ALKPHOS 60  BILITOT 0.7  PROT 8.8*  ALBUMIN 3.1*   No results for input(s): LIPASE, AMYLASE in the last 168 hours. No results for input(s): AMMONIA in the last 168 hours.  CBC:  Recent Labs Lab 11/10/16 0640  WBC 15.0*  NEUTROABS 12.4*  HGB 8.4*  HCT 26.1*  MCV 88.3  PLT 472*    Cardiac Enzymes:  Recent Labs Lab 11/10/16 0640  TROPONINI 0.06*    BNP: Invalid input(s): POCBNP  CBG:  Recent Labs Lab 11/10/16 0851  GLUCAP  105*    Microbiology: Results for orders placed or performed during the hospital encounter of 10/16/16  Urine culture     Status: Abnormal   Collection Time: 10/16/16  1:11 PM  Result Value Ref Range Status   Specimen Description URINE, RANDOM  Final   Special Requests NONE  Final   Culture (A)  Final    <10,000 COLONIES/mL INSIGNIFICANT GROWTH Performed at [facility], [place] [address], [place], [place] [address]    Report Status 10/17/2016 FINAL  Final    Coagulation Studies: No results for input(s): LABPROT, INR in the last 72 hours.  Urinalysis: No results for input(s): COLORURINE, LABSPEC, PHURINE, GLUCOSEU, HGBUR, BILIRUBINUR, KETONESUR, PROTEINUR, UROBILINOGEN, NITRITE, LEUKOCYTESUR in the last 72 hours.  Invalid input(s): APPERANCEUR    Imaging: Dg Chest Port 1 View  Result Date: 11/10/2016 CLINICAL DATA:  Acute respiratory distress this morning. EXAM: PORTABLE CHEST 1 VIEW COMPARISON:  08/20/2016 FINDINGS: Stable cardiomegaly. Worsened vascular and interstitial prominence. Hazy central and basilar airspace opacities. Probable small pleural effusions. IMPRESSION: The findings likely represent congestive heart failure with interstitial and alveolar edema. Probable small pleural effusions. Electronically Signed   By: Andreas Newport M.D.   On: 11/10/2016 05:47     Medications:    . amLODipine  5 mg Oral Daily  . aspirin  81 mg Oral Daily  .  azithromycin  500 mg Intravenous Q24H  . calcium acetate  667 mg Oral TID WC  . cinacalcet  30 mg Oral Q breakfast  . cloNIDine  0.1 mg Oral BID  . guaiFENesin  600 mg Oral BID   And  . dextromethorphan  30 mg Oral BID  . [START ON 11/11/2016] furosemide  80 mg Oral Daily  . gabapentin  300 mg Oral BID  . heparin  5,000 Units Subcutaneous Q8H  . hydrALAZINE  50 mg Oral TID  . insulin aspart  0-15 Units Subcutaneous TID WC  . insulin aspart  0-5 Units Subcutaneous QHS  . methylPREDNISolone (SOLU-MEDROL) injection  40 mg  Intravenous Q12H  . metoprolol  50 mg Oral BID  . sodium chloride flush  3 mL Intravenous Q12H   sodium chloride, acetaminophen, ipratropium-albuterol, nitroGLYCERIN, ondansetron (ZOFRAN) IV, sodium chloride flush  Assessment/ Plan:  56 y.o. female African-American female with end-stage renal disease secondary to polycystic kidney disease, severe hypertension, GERD, peritoneal dialysis and history of peritonitis, arthritis, chronic left hand pain, history of ruptured AV access Presents for acute pulmonary edema  TTS, [facility] [address]   1. End Stage Renal Disease: Patient due for hemodialysis today. We will prepare orders. Ultrafiltration target 3 kg.  2. Anemia of chronic kidney disease: Hemoglobin currently 8.4. Start the patient on Epogen 10,000 units IV with dialysis.  3. Acute pulmonary edema:  echo from 01/2016 shows Moderate conc LVH - Has recurrent pulmonary edema. We will plan for ultrafiltration target of 3 kg as above.  4. Secondary Hyperparathyroidism:  Check intact PTH and phosphorus with dialysis. Increase PhosLo to 3 tablets by mouth 3 times a day with meals.   LOS: 0 Zamyra Allensworth 4/3/201812:14 PM

## 2016-11-10 NOTE — Progress Notes (Signed)
Consult placed at 821 AM. Consult called at 410 PM. Discussed with internal medicine. No need to consult as patient has known chronic diastolic CHF with volume management per hemodialysis. Mild troponinemia in the setting of ESRD.

## 2016-11-10 NOTE — Progress Notes (Signed)
Pre HD  

## 2016-11-10 NOTE — ED Triage Notes (Signed)
Pt presents via EMS w/ c/o cough that is persistant and she is unable to stop coughing and sleep. Auscultation wheezing throughout.

## 2016-11-10 NOTE — Progress Notes (Addendum)
[facility] [place] NAME: Jasmine Holloway    MR#:  [id number]  DATE OF BIRTH:  [birth year]  SUBJECTIVE:   Patient presented with cough and shortness of breath. Patient seen in the emergency room this morning.  REVIEW OF SYSTEMS:    Review of Systems  Constitutional: Negative for chills, fever and malaise/fatigue.  HENT: Negative.  Negative for ear discharge, ear pain, hearing loss, nosebleeds and sore throat.   Eyes: Negative.  Negative for blurred vision and pain.  Respiratory: Positive for cough, sputum production and shortness of breath. Negative for hemoptysis and wheezing.   Cardiovascular: Negative.  Negative for chest pain, palpitations and leg swelling.  Gastrointestinal: Negative.  Negative for abdominal pain, blood in stool, diarrhea, nausea and vomiting.  Genitourinary: Negative.  Negative for dysuria.  Musculoskeletal: Negative.  Negative for back pain.  Skin: Negative.   Neurological: Positive for weakness. Negative for dizziness, tremors, speech change, focal weakness, seizures and headaches.  Endo/Heme/Allergies: Negative.  Does not bruise/bleed easily.  Psychiatric/Behavioral: Negative.  Negative for depression, hallucinations and suicidal ideas.    Tolerating Diet: yes      DRUG ALLERGIES:   Allergies  Allergen Reactions  . Morphine And Related Nausea And Vomiting  . Strawberry Extract   . Tramadol Nausea And Vomiting  . Venofer [Iron Sucrose]   . Vicodin [Hydrocodone-Acetaminophen] Nausea And Vomiting  . Buprenorphine Hcl Nausea And Vomiting  . Codeine Nausea And Vomiting    VITALS:  Blood pressure (!) 166/96, pulse (!) 51, temperature 98.1 F (36.7 C), temperature source Oral, resp. rate (!) 22, height 5\' 3"  (1.6 m), weight 81.8 kg (180 lb 4.8 oz), SpO2 96 %.  PHYSICAL EXAMINATION:   Physical Exam  Constitutional: She is oriented to person, place, and time and well-developed, well-nourished, and in no  distress. No distress.  HENT:  Head: Normocephalic.  Eyes: No scleral icterus.  Neck: Normal range of motion. Neck supple. No JVD present. No tracheal deviation present.  Cardiovascular: Normal rate, regular rhythm and normal heart sounds.  Exam reveals no gallop and no friction rub.   No murmur heard. Pulmonary/Chest: Effort normal. No respiratory distress. She has wheezes. She has rales. She exhibits no tenderness.  Abdominal: Soft. Bowel sounds are normal. She exhibits no distension and no mass. There is no tenderness. There is no rebound and no guarding.  Musculoskeletal: Normal range of motion. She exhibits no edema.  Neurological: She is alert and oriented to person, place, and time.  Skin: Skin is warm. No rash noted. No erythema.  Psychiatric: Affect and judgment normal.      LABORATORY PANEL:   CBC  Recent Labs Lab 11/10/16 0640  WBC 15.0*  HGB 8.4*  HCT 26.1*  PLT 472*   ------------------------------------------------------------------------------------------------------------------  Chemistries   Recent Labs Lab 11/10/16 0640  NA 138  K 5.0  CL 96*  CO2 26  GLUCOSE 99  BUN 56*  CREATININE 13.00*  CALCIUM 9.1  AST 16  ALT 10*  ALKPHOS 60  BILITOT 0.7   ------------------------------------------------------------------------------------------------------------------  Cardiac Enzymes  Recent Labs Lab 11/10/16 0640  TROPONINI 0.06*   ------------------------------------------------------------------------------------------------------------------  RADIOLOGY:  Dg Chest Port 1 View  Result Date: 11/10/2016 CLINICAL DATA:  Acute respiratory distress this morning. EXAM: PORTABLE CHEST 1 VIEW COMPARISON:  08/20/2016 FINDINGS: Stable cardiomegaly. Worsened vascular and interstitial prominence. Hazy central and basilar airspace opacities. Probable small pleural effusions. IMPRESSION: The findings likely represent congestive heart failure with  interstitial and alveolar  edema. Probable small pleural effusions. Electronically Signed   By: Andreas Newport M.D.   On: 11/10/2016 05:47     ASSESSMENT AND PLAN:   56 year old female with chronic diastolic heart failure and preserved ejection fraction, COPD on 2 L of oxygen at home, ESRD on hemodialysis who presented with shortness of breath.  1. Acute on chronic hypoxic respiratory failure in the setting of acute on chronic diastolic heart failure and COPD exacerbation.  2.acute on chronic heart failure, diastolic pEF: Patient given 1 dose of Lasix in the emergency room. She needs hemodialysis. Follow up on echocardiogram.  3. Acute COPD exacerbation: Continue IV steroids and azithromycin for acute bronchitis due to COPD exacerbation. Continue nebulizer treatment  4. ESRD: Continue hemodialysis  5. Essential hypertension: Continue Norvasc, clonidine, hydralazine, metoprolol Monitor blood pressure  6. History of diabetes, diet control: Continue ADA diet  7. Elevated troponin in the setting of poor renal clearance and demand ischemia  Follow troponins and echocardiogram Follow up on cardiology consult  Management plans discussed with the patient and she is in agreement.  CODE STATUS: FULL  TOTAL TIME TAKING CARE OF THIS PATIENT: 31 minutes.     POSSIBLE D/C 2 days, DEPENDING ON CLINICAL CONDITION.   Drago Hammonds M.D on 11/10/2016 at 8:55 AM  Between 7am to 6pm - Pager - [phone] After 6pm go to [email or web address] - password [email or web address] [facility]  Office  [phone]  CC: Primary care physician; Elyse Jarvis, MD  Note: This dictation was prepared with Dragon dictation along with smaller phrase technology. Any transcriptional errors that result from this process are unintentional.

## 2016-11-10 NOTE — Progress Notes (Signed)
HD initiated without issue  

## 2016-11-10 NOTE — Progress Notes (Signed)
Post HD assessment unchanged  

## 2016-11-10 NOTE — H&P (Addendum)
History and Physical   [facility] - [place] @ [facility] Admission History and Physical [employer], D.O.    Patient Name: Jasmine Holloway MR#: [id number] Date of Birth: 12-28-1960 Date of Admission: 11/10/2016  Referring MD/NP/PA: Dr. Mable Paris Primary Care Physician: Elyse Jarvis, MD Patient coming from: Home Outpatient Specialists: [facility] Transplant, [facility] Nephrology, Dr. Rockey Situ, [facility] Pain management   Chief Complaint:  Chief Complaint  Patient presents with  . Cough    HPI: Jasmine Holloway is a 56 y.o. female with a known history of chronic diastolic congestive heart failure, COPD, DM, ESRD on HD, HTN presents to the emergency department for evaluation of SOB.  Patient was in a usual state of health until two days ago when she developed a shortness of breath associated with a cough productive cough of white sputum, fevers/chills.  She reports associated chest pain with coughing. Her boyfriend has also been coughing recently.  Patient denies weakness, dizziness, N/V/C/D, abdominal pain, dysuria/frequency, changes in mental status.    Of note patient was recently hospitalized for 3 days following hypertensive emergency after stress test.  Otherwise there has been no change in status. Patient has been taking medication as prescribed and there has been no recent change in medication or diet.  No recent antibiotics.    EMS/ED Course: Patient received Duonebx2, nitro, prednisone.Patient toxic setting of 79-81% on room air following breathing treatments therefore admission was requested.  Review of Systems:  CONSTITUTIONAL: Positive fever/chills, fatigue. Negative weakness, Negative weight gain/loss, headache. EYES: No blurry or double vision. ENT: No tinnitus, postnasal drip, redness or soreness of the oropharynx. RESPIRATORY: Positive cough, dyspnea, wheeze.  No hemoptysis.  CARDIOVASCULAR: No chest pain, palpitations, syncope, orthopnea. No lower extremity edema.  GASTROINTESTINAL:  No nausea, vomiting, abdominal pain, diarrhea, constipation.  No hematemesis, melena or hematochezia. GENITOURINARY: No dysuria, frequency, hematuria. ENDOCRINE: No polyuria or nocturia. No heat or cold intolerance. HEMATOLOGY: No anemia, bruising, bleeding. INTEGUMENTARY: No rashes, ulcers, lesions. MUSCULOSKELETAL: No arthritis, gout, dyspnea. NEUROLOGIC: No numbness, tingling, ataxia, seizure-type activity, weakness. PSYCHIATRIC: No anxiety, depression, insomnia.   Past Medical History:  Diagnosis Date  . Asthma   . CHF (congestive heart failure) ([place])   . COPD (chronic obstructive pulmonary disease) ([place])   . Diabetes mellitus without complication ([place])   . Diastolic heart failure ([place])   . ESRD (end stage renal disease) on dialysis ([place])   . Hypertension   . Neuropathy ([place])   . Polycystic kidney disease   . Renal insufficiency     Past Surgical History:  Procedure Laterality Date  . ARTERIOVENOUS GRAFT PLACEMENT Right    x3 (R forearm currently used for access)  . BREAST BIOPSY Left 12/03/2015   neg  . COLON SURGERY    . PERIPHERAL VASCULAR CATHETERIZATION Right 08/04/2016   Procedure: A/V Shuntogram;  Surgeon: Algernon Huxley, MD;  Location: [place] [facility];  Service: Cardiovascular;  Laterality: Right;  . VEIN HARVEST       reports that she quit smoking about 15 months ago. She smoked 0.25 packs per day. She has never used smokeless tobacco. She reports that she does not drink alcohol or use drugs.  Allergies  Allergen Reactions  . Morphine And Related Nausea And Vomiting  . Strawberry Extract   . Tramadol Nausea And Vomiting  . Venofer [Iron Sucrose]   . Vicodin [Hydrocodone-Acetaminophen] Nausea And Vomiting  . Buprenorphine Hcl Nausea And Vomiting  . Codeine Nausea And Vomiting    Family History  Problem Relation  Age of Onset  . Hypertension Brother   . Heart failure Brother   . Hypertension Mother   . Heart disease Mother     Prior to Admission  medications   Medication Sig Start Date End Date Taking? Authorizing Provider  acetaminophen (TYLENOL) 325 MG tablet Take 2 tablets (650 mg total) by mouth every 6 (six) hours as needed for mild pain or headache. 03/18/16   Nicholes Mango, MD  albuterol-ipratropium (COMBIVENT) 18-103 MCG/ACT inhaler Inhale 2 puffs into the lungs daily as needed for wheezing or shortness of breath.    Historical Provider, MD  aspirin 81 MG chewable tablet Chew 1 tablet (81 mg total) by mouth daily. 01/17/16   Gladstone Lighter, MD  b complex-vitamin c-folic acid (NEPHRO-VITE) 0.8 MG TABS tablet Take 1 tablet by mouth at bedtime.    Historical Provider, MD  baclofen (LIORESAL) 10 MG tablet Take 5 mg by mouth 2 (two) times daily.    Historical Provider, MD  calcium acetate (PHOSLO) 667 MG capsule Take 3 capsules (2,001 mg total) by mouth 3 (three) times daily with meals. Patient taking differently: Take 667 mg by mouth 3 (three) times daily with meals.  07/07/16   Fritzi Mandes, MD  cefdinir (OMNICEF) 300 MG capsule Take 1 capsule (300 mg total) by mouth 2 (two) times daily. 10/16/16   Carrie Mew, MD  cloNIDine (CATAPRES) 0.1 MG tablet Take 1 tablet (0.1 mg total) by mouth 2 (two) times daily. 01/17/16   Gladstone Lighter, MD  cloNIDine (CATAPRES) 0.1 MG tablet Take 0.1 mg by mouth as needed. Prn diastolic >569 and/or systolic B/P >794    Historical Provider, MD  diphenhydrAMINE (BENADRYL) 25 mg capsule Take 2 capsules (50 mg total) by mouth every 6 (six) hours as needed. 10/16/16   Carrie Mew, MD  Doxercalciferol (HECTOROL) 1 MCG CAPS Take 3.5 mcg by mouth 3 (three) times a week.    Historical Provider, MD  epoetin alfa (EPOGEN) 3000 UNIT/ML injection Inject 6,000 Units into the vein 3 (three) times a week.    Historical Provider, MD  furosemide (LASIX) 80 MG tablet Take 1 tablet (80 mg total) by mouth daily. 03/05/16 08/18/16  Alisa Graff, FNP  gabapentin (NEURONTIN) 300 MG capsule Take 300 mg by mouth 2 (two) times  daily.     Historical Provider, MD  irbesartan (AVAPRO) 150 MG tablet Take 1 tablet by mouth daily. 05/20/16   Historical Provider, MD  iron sucrose (VENOFER) 20 MG/ML injection Inject 50 mg into the vein once a week.    Historical Provider, MD  LORazepam (ATIVAN) 1 MG tablet Take 1 tablet (1 mg total) by mouth 2 (two) times daily as needed for anxiety. Patient not taking: Reported on 08/18/2016 07/21/16   Daymon Larsen, MD  losartan (COZAAR) 25 MG tablet 25 mg daily.  05/12/16   Historical Provider, MD  metolazone (ZAROXOLYN) 5 MG tablet Take 1 tablet (5 mg total) by mouth daily as needed. Patient taking differently: Take 5 mg by mouth daily.  06/08/16 09/06/16  Minna Merritts, MD  metoprolol (LOPRESSOR) 50 MG tablet 50 mg 2 (two) times daily.  05/12/16   Historical Provider, MD  nortriptyline (PAMELOR) 25 MG capsule Take 25 mg by mouth at bedtime.    Historical Provider, MD  omeprazole (PRILOSEC) 40 MG capsule Take 40 mg by mouth daily.     Historical Provider, MD  ondansetron (ZOFRAN) 4 MG tablet Take 1 tablet (4 mg total) by mouth every 8 (eight) hours  as needed for nausea or vomiting. Patient not taking: Reported on 08/18/2016 07/21/16   Daymon Larsen, MD  prochlorperazine (COMPAZINE) 10 MG tablet Take 1 tablet (10 mg total) by mouth every 6 (six) hours as needed for nausea. 10/15/16   Paulette Blanch, MD  promethazine (PHENERGAN) 25 MG tablet Take 1 tablet (25 mg total) by mouth every 6 (six) hours as needed for nausea or vomiting. 10/16/16   Carrie Mew, MD  rosuvastatin (CRESTOR) 5 MG tablet Take 1 tablet (5 mg total) by mouth at bedtime. 07/07/16   Fritzi Mandes, MD  SENSIPAR 30 MG tablet Take 1 tablet (30 mg total) by mouth daily. 07/07/16   Fritzi Mandes, MD  traMADol (ULTRAM) 50 MG tablet Take 1 tablet (50 mg total) by mouth every 6 (six) hours as needed. Patient not taking: Reported on 08/18/2016 07/21/16   Daymon Larsen, MD  VENTOLIN HFA 108 [phone] Base) MCG/ACT inhaler Inhale 2 puffs into the  lungs daily as needed.  05/21/16   Historical Provider, MD    Physical Exam: Vitals:   11/10/16 0505 11/10/16 0507 11/10/16 0508  BP:  (!) 169/103   Pulse:  100   Resp:  (!) 24   Temp:  99.2 F (37.3 C)   TempSrc:  Oral   SpO2: 99% (!) 89%   Weight:   82.1 kg (181 lb)  Height:   5\' 3"  (1.6 m)    GENERAL: 55 y.o.-year-old female patient, ill-appearing, coughing. well-developed, well-nourished lying in the bed in no acute distress.  Pleasant and cooperative.   HEENT: Head atraumatic, normocephalic. Pupils equal, round, reactive to light and accommodation. No scleral icterus. Extraocular muscles intact. Nares are patent. Oropharynx is clear. Mucus membranes moist. NECK: Supple, full range of motion. No JVD, no bruit heard. No thyroid enlargement, no tenderness, no cervical lymphadenopathy. CHEST: Bibasilar crackles. No use of accessory muscles of respiration.  No reproducible chest wall tenderness.  CARDIOVASCULAR: S1, S2 normal. No murmurs, rubs, or gallops. Cap refill <2 seconds. Pulses intact distally.  ABDOMEN: Soft, nondistended, nontender. No rebound, guarding, rigidity. Normoactive bowel sounds present in all four quadrants. No organomegaly or mass. EXTREMITIES: Positive bilateral pitting edema to midcalf. No cyanosis, or clubbing. No calf tenderness or Homan's sign.  NEUROLOGIC: The patient is alert and oriented x 3. Cranial nerves II through XII are grossly intact with no focal sensorimotor deficit. Muscle strength 5/5 in all extremities. Sensation intact. Gait not checked. PSYCHIATRIC:  Normal affect, mood, thought content. SKIN: Warm, dry, and intact without obvious rash, lesion, or ulcer.    Labs on Admission:  CBC: No results for input(s): WBC, NEUTROABS, HGB, HCT, MCV, PLT in the last 168 hours. Basic Metabolic Panel: No results for input(s): NA, K, CL, CO2, GLUCOSE, BUN, CREATININE, CALCIUM, MG, PHOS in the last 168 hours. GFR: CrCl cannot be calculated (Patient's most  recent lab result is older than the maximum 21 days allowed.). Liver Function Tests: No results for input(s): AST, ALT, ALKPHOS, BILITOT, PROT, ALBUMIN in the last 168 hours. No results for input(s): LIPASE, AMYLASE in the last 168 hours. No results for input(s): AMMONIA in the last 168 hours. Coagulation Profile: No results for input(s): INR, PROTIME in the last 168 hours. Cardiac Enzymes: No results for input(s): CKTOTAL, CKMB, CKMBINDEX, TROPONINI in the last 168 hours. BNP (last 3 results) No results for input(s): PROBNP in the last 8760 hours. HbA1C: No results for input(s): HGBA1C in the last 72 hours. CBG: No results for input(s):  GLUCAP in the last 168 hours. Lipid Profile: No results for input(s): CHOL, HDL, LDLCALC, TRIG, CHOLHDL, LDLDIRECT in the last 72 hours. Thyroid Function Tests: No results for input(s): TSH, T4TOTAL, FREET4, T3FREE, THYROIDAB in the last 72 hours. Anemia Panel: No results for input(s): VITAMINB12, FOLATE, FERRITIN, TIBC, IRON, RETICCTPCT in the last 72 hours. Urine analysis:    Component Value Date/Time   COLORURINE YELLOW (A) 10/16/2016 1311   APPEARANCEUR CLOUDY (A) 10/16/2016 1311   LABSPEC 1.008 10/16/2016 1311   PHURINE 9.0 (H) 10/16/2016 1311   GLUCOSEU 150 (A) 10/16/2016 1311   HGBUR NEGATIVE 10/16/2016 1311   BILIRUBINUR NEGATIVE 10/16/2016 1311   KETONESUR NEGATIVE 10/16/2016 1311   PROTEINUR >=300 (A) 10/16/2016 1311   NITRITE NEGATIVE 10/16/2016 1311   LEUKOCYTESUR SMALL (A) 10/16/2016 1311   Sepsis Labs: @LABRCNTIP (procalcitonin:4,lacticidven:4) )No results found for this or any previous visit (from the past 240 hour(s)).   Radiological Exams on Admission: Dg Chest Port 1 View  Result Date: 11/10/2016 CLINICAL DATA:  Acute respiratory distress this morning. EXAM: PORTABLE CHEST 1 VIEW COMPARISON:  08/20/2016 FINDINGS: Stable cardiomegaly. Worsened vascular and interstitial prominence. Hazy central and basilar airspace opacities.  Probable small pleural effusions. IMPRESSION: The findings likely represent congestive heart failure with interstitial and alveolar edema. Probable small pleural effusions. Electronically Signed   By: Andreas Newport M.D.   On: 11/10/2016 05:47    EKG: Sinus @ 84bpm, normal axis, anterior Q waves, nonspecific ST - T wave changes.   Assessment/Plan  This is a 56 y.o. female with a history of chronic diastolic congestive heart failure, COPD, DM, ESRD on HD, HTN now being admitted with:  #. Acute hypoxic respiratory failure, exacerbation of CHF - Admit to observation, telemetry monitoring, continuous pulse ox - Continue metoprolol, Avapro, Cozaar, metolazone, lasix - Intake/output, daily weight. - Trend troponins, check lipids and TSH. - Cardiology consultation requested.  #. COPD Exacerbation - Follow up flu swab and sputum culture - IV steroids and azithromycin - Nebulizers, O2 therapy and expectorants as needed.  - Continuous pulse oximetry - Consider pulmonary consult if not improving.   #. H/o Diabetes - Accuchecks achs with RISS coverage - Heart healthy, carb controlled diet  #. History of ESRD on HD - Nephrology consult for HD today - Continue Phoslo, Sensipar  #. History of HTN - Continue Catapres, Avapro, Cozaar, metoprolol  #. History of CAD - Continue aspirin  #. History of neuropathy - Continue gabapentin  #. History of GERD - Continue Prilosec  #. History of HLD - Continue Crestor  Admission status: Observation IV Fluids: HL Diet/Nutrition: HH, CC, Renal Consults called: Renal  DVT Px: Heparin SCDs and early ambulation. Code Status: Full Code  Disposition Plan: To home in <1 day  All the records are reviewed and case discussed with ED provider. Management plans discussed with the patient and/or family who express understanding and agree with plan of care.  Aydyn Testerman D.O. on 11/10/2016 at 5:52 AM Between 7am to 6pm - Pager -  [phone] After 6pm go to [email or web address] - [employer] [facility] [place] [facility] Office [phone] CC: Primary care physician; Elyse Jarvis, MD   11/10/2016, 5:52 AM

## 2016-11-10 NOTE — Progress Notes (Signed)
HD completed without issue.

## 2016-11-10 NOTE — Progress Notes (Signed)
Per Dr. Benjie Karvonen hold BP meds at this time for BP of 149/89 after coming back from dialysis. Okay to administer aspirin as pt keeps having elevated troponins. Also okay per MD to remove nito patch as pt was complaining of headache.

## 2016-11-10 NOTE — Progress Notes (Signed)
Nitro patch removed from chest.

## 2016-11-10 NOTE — Progress Notes (Signed)
Notified Dr. Benjie Karvonen of critical troponin of 0.06. Took multiple pages to get in touch with MD. No new orders received will continue to monitor still at dialysis at this time

## 2016-11-11 LAB — HIV ANTIBODY (ROUTINE TESTING W REFLEX): HIV Screen 4th Generation wRfx: NONREACTIVE

## 2016-11-11 LAB — GLUCOSE, CAPILLARY
GLUCOSE-CAPILLARY: 127 mg/dL — AB (ref 65–99)
Glucose-Capillary: 125 mg/dL — ABNORMAL HIGH (ref 65–99)

## 2016-11-11 LAB — PARATHYROID HORMONE, INTACT (NO CA): PTH: 38 pg/mL (ref 15–65)

## 2016-11-11 LAB — ECHOCARDIOGRAM COMPLETE
Height: 63 in
Weight: 2783.09 oz

## 2016-11-11 LAB — BASIC METABOLIC PANEL
ANION GAP: 13 (ref 5–15)
BUN: 48 mg/dL — ABNORMAL HIGH (ref 6–20)
CHLORIDE: 95 mmol/L — AB (ref 101–111)
CO2: 28 mmol/L (ref 22–32)
CREATININE: 8.99 mg/dL — AB (ref 0.44–1.00)
Calcium: 9.2 mg/dL (ref 8.9–10.3)
GFR calc non Af Amer: 4 mL/min — ABNORMAL LOW (ref >60)
GFR, EST AFRICAN AMERICAN: 5 mL/min — AB (ref >60)
Glucose, Bld: 129 mg/dL — ABNORMAL HIGH (ref 65–99)
Potassium: 5.9 mmol/L — ABNORMAL HIGH (ref 3.5–5.1)
SODIUM: 136 mmol/L (ref 135–145)

## 2016-11-11 MED ORDER — PANTOPRAZOLE SODIUM 40 MG PO TBEC
40.0000 mg | DELAYED_RELEASE_TABLET | Freq: Every day | ORAL | Status: DC
  Administered 2016-11-11: 40 mg via ORAL
  Filled 2016-11-11: qty 1

## 2016-11-11 MED ORDER — SODIUM POLYSTYRENE SULFONATE 15 GM/60ML PO SUSP
30.0000 g | ORAL | Status: AC
  Administered 2016-11-11: 30 g via ORAL
  Filled 2016-11-11: qty 120

## 2016-11-11 NOTE — Progress Notes (Signed)
[facility]  ROUNDING NOTE   Subjective:  Patient seen at bedside. She completed hemodialysis yesterday. Breathing significantly improved.   Objective:  Vital signs in last 24 hours:  Temp:  [97.4 F (36.3 C)-98.2 F (36.8 C)] 97.4 F (36.3 C) (04/04 1203) Pulse Rate:  [60-85] 60 (04/04 1203) Resp:  [15-22] 20 (04/04 0457) BP: (141-169)/(82-99) 143/82 (04/04 1203) SpO2:  [93 %-98 %] 97 % (04/04 1203) Weight:  [78.9 kg (173 lb 15.1 oz)-81.2 kg (179 lb 0.2 oz)] 78.9 kg (173 lb 15.1 oz) (04/03 1648)  Weight change: -0.318 kg (-11.2 oz) Filed Weights   11/10/16 0832 11/10/16 1319 11/10/16 1648  Weight: 81.8 kg (180 lb 4.8 oz) 81.2 kg (179 lb 0.2 oz) 78.9 kg (173 lb 15.1 oz)    Intake/Output: I/O last 3 completed shifts: In: 600 [P.O.:600] Out: 2511 [Other:2511]   Intake/Output this shift:  Total I/O In: 240 [P.O.:240] Out: -   Physical Exam: General: No acute distress  Head: Normocephalic, atraumatic. Moist oral mucosal membranes  Eyes: Anicteric  Neck: Supple, trachea midline  Lungs:  Scattered rhonchi, normal effort  Heart: S1S2 no rubs  Abdomen:  Soft, nontender  Extremities: trace peripheral edema.  Neurologic: Nonfocal, moving all four extremities  Skin: No lesions  Access: RUE AVG    Basic Metabolic Panel:  Recent Labs Lab 11/10/16 0640 11/10/16 1530 11/11/16 0454  NA 138  --  136  K 5.0  --  5.9*  CL 96*  --  95*  CO2 26  --  28  GLUCOSE 99  --  129*  BUN 56*  --  48*  CREATININE 13.00*  --  8.99*  CALCIUM 9.1  --  9.2  PHOS  --  3.7  --     Liver Function Tests:  Recent Labs Lab 11/10/16 0640  AST 16  ALT 10*  ALKPHOS 60  BILITOT 0.7  PROT 8.8*  ALBUMIN 3.1*   No results for input(s): LIPASE, AMYLASE in the last 168 hours. No results for input(s): AMMONIA in the last 168 hours.  CBC:  Recent Labs Lab 11/10/16 0640  WBC 15.0*  NEUTROABS 12.4*  HGB 8.4*  HCT 26.1*  MCV 88.3  PLT 472*    Cardiac  Enzymes:  Recent Labs Lab 11/10/16 0640 11/10/16 1313 11/10/16 1803  TROPONINI 0.06* 0.06* 0.04*    BNP: Invalid input(s): POCBNP  CBG:  Recent Labs Lab 11/10/16 1225 11/10/16 1730 11/10/16 2126 11/11/16 0726 11/11/16 1120  GLUCAP 154* 148* 152* 125* 127*    Microbiology: Results for orders placed or performed during the hospital encounter of 11/10/16  MRSA PCR Screening     Status: None   Collection Time: 11/10/16  6:20 PM  Result Value Ref Range Status   MRSA by PCR NEGATIVE NEGATIVE Final    Comment:        The GeneXpert MRSA Assay (FDA approved for NASAL specimens only), is one component of a comprehensive MRSA colonization surveillance program. It is not intended to diagnose MRSA infection nor to guide or monitor treatment for MRSA infections.     Coagulation Studies: No results for input(s): LABPROT, INR in the last 72 hours.  Urinalysis: No results for input(s): COLORURINE, LABSPEC, PHURINE, GLUCOSEU, HGBUR, BILIRUBINUR, KETONESUR, PROTEINUR, UROBILINOGEN, NITRITE, LEUKOCYTESUR in the last 72 hours.  Invalid input(s): APPERANCEUR    Imaging: Dg Chest Port 1 View  Result Date: 11/10/2016 CLINICAL DATA:  Acute respiratory distress this morning. EXAM: PORTABLE CHEST 1 VIEW COMPARISON:  08/20/2016  FINDINGS: Stable cardiomegaly. Worsened vascular and interstitial prominence. Hazy central and basilar airspace opacities. Probable small pleural effusions. IMPRESSION: The findings likely represent congestive heart failure with interstitial and alveolar edema. Probable small pleural effusions. Electronically Signed   By: Andreas Newport M.D.   On: 11/10/2016 05:47     Medications:    . amLODipine  5 mg Oral Daily  . aspirin  81 mg Oral Daily  . azithromycin  500 mg Intravenous Q24H  . calcium acetate  2,001 mg Oral TID WC  . cinacalcet  30 mg Oral Q breakfast  . cloNIDine  0.1 mg Oral BID  . guaiFENesin  600 mg Oral BID   And  . dextromethorphan   30 mg Oral BID  . [START ON 11/12/2016] epoetin (EPOGEN/PROCRIT) injection  10,000 Units Intravenous Q T,Th,Sa-HD  . furosemide  80 mg Oral Daily  . gabapentin  300 mg Oral BID  . heparin  5,000 Units Subcutaneous Q8H  . hydrALAZINE  50 mg Oral TID  . insulin aspart  0-15 Units Subcutaneous TID WC  . insulin aspart  0-5 Units Subcutaneous QHS  . methylPREDNISolone (SOLU-MEDROL) injection  40 mg Intravenous Q12H  . metoprolol  50 mg Oral BID  . pantoprazole  40 mg Oral Daily  . sodium chloride flush  3 mL Intravenous Q12H   sodium chloride, sodium chloride, sodium chloride, acetaminophen, alteplase, heparin, ipratropium-albuterol, lidocaine (PF), lidocaine-prilocaine, nitroGLYCERIN, ondansetron (ZOFRAN) IV, pentafluoroprop-tetrafluoroeth, sodium chloride flush  Assessment/ Plan:  56 y.o. female African-American female with end-stage renal disease secondary to polycystic kidney disease, severe hypertension, GERD, peritoneal dialysis and history of peritonitis, arthritis, chronic left hand pain, history of ruptured AV access Presents for acute pulmonary edema  TTS, [facility] [address]   1. End Stage Renal Disease: Patient had hemodialysis yesterday and tolerated well. No acute indication for dialysis today. Potassium noted to be a bit high and we will start the patient on patiromer 8.4 g by mouth daily.  2. Anemia of chronic kidney disease: Continue epogen 10000 units IV with HD.   3. Acute pulmonary edema:  echo from 01/2016 shows Moderate conc LVH - Improved with dialysis, plan for additional UF tomorrow if still here.   4. Secondary Hyperparathyroidism:  PTH low at 38, phos normal at 3.7.  Discontinue sensipar given low PTH.    LOS: 1 Winnie Barsky 4/4/201812:09 PM

## 2016-11-11 NOTE — Progress Notes (Signed)
11/11/2016 2:32 PM  Jasmine Holloway to be D/C'd Home per MD order.  Discussed prescriptions and follow up appointments with the patient. Prescriptions given to patient, medication list explained in detail. Pt verbalized understanding.  Allergies as of 11/11/2016      Reactions   Morphine And Related Nausea And Vomiting   Strawberry Extract    Tramadol Nausea And Vomiting   Venofer [iron Sucrose]    Vicodin [hydrocodone-acetaminophen] Nausea And Vomiting   Buprenorphine Hcl Nausea And Vomiting   Codeine Nausea And Vomiting      Medication List    STOP taking these medications   cefdinir 300 MG capsule Commonly known as:  OMNICEF     TAKE these medications   acetaminophen 325 MG tablet Commonly known as:  TYLENOL Take 2 tablets (650 mg total) by mouth every 6 (six) hours as needed for mild pain or headache.   amLODipine 5 MG tablet Commonly known as:  NORVASC Take 5 mg by mouth daily. Notes to patient:  Last administered 11/11/16   aspirin 81 MG chewable tablet Chew 1 tablet (81 mg total) by mouth daily. Notes to patient:  Last administered 11/11/16   b complex-vitamin c-folic acid 0.8 MG Tabs tablet Take 1 tablet by mouth at bedtime. Notes to patient:  Last administered 11/10/16   calcium acetate 667 MG capsule Commonly known as:  PHOSLO Take 3 capsules (2,001 mg total) by mouth 3 (three) times daily with meals. What changed:  how much to take Notes to patient:  Last administered lunch time 11/11/16   cloNIDine 0.1 MG tablet Commonly known as:  CATAPRES Take 1 tablet (0.1 mg total) by mouth 2 (two) times daily. Notes to patient:  Last administered 11/11/16 morning   diphenhydrAMINE 25 mg capsule Commonly known as:  BENADRYL Take 2 capsules (50 mg total) by mouth every 6 (six) hours as needed.   furosemide 80 MG tablet Commonly known as:  LASIX Take 1 tablet (80 mg total) by mouth daily. Notes to patient:  Last administered 11/11/16 morning   gabapentin 300 MG capsule Commonly  known as:  NEURONTIN Take 300 mg by mouth 2 (two) times daily. Notes to patient:  Last administered morning 11/11/16   hydrALAZINE 50 MG tablet Commonly known as:  APRESOLINE Take 50 mg by mouth 3 (three) times daily. Notes to patient:  Last administered morning 11/11/16   LORazepam 1 MG tablet Commonly known as:  ATIVAN Take 1 tablet (1 mg total) by mouth 2 (two) times daily as needed for anxiety.   metolazone 5 MG tablet Commonly known as:  ZAROXOLYN Take 1 tablet (5 mg total) by mouth daily as needed. What changed:  when to take this   metoprolol 50 MG tablet Commonly known as:  LOPRESSOR 50 mg 2 (two) times daily. Notes to patient:  Last administered 11/11/16 morning   omeprazole 40 MG capsule Commonly known as:  PRILOSEC Take 40 mg by mouth daily. Notes to patient:  Last administered 11/11/16 afternoon   ondansetron 4 MG tablet Commonly known as:  ZOFRAN Take 1 tablet (4 mg total) by mouth every 8 (eight) hours as needed for nausea or vomiting.   prochlorperazine 10 MG tablet Commonly known as:  COMPAZINE Take 1 tablet (10 mg total) by mouth every 6 (six) hours as needed for nausea.   promethazine 25 MG tablet Commonly known as:  PHENERGAN Take 1 tablet (25 mg total) by mouth every 6 (six) hours as needed for nausea or vomiting.  rosuvastatin 5 MG tablet Commonly known as:  CRESTOR Take 1 tablet (5 mg total) by mouth at bedtime. Notes to patient:  Last administered 11/10/16 bed time   SENSIPAR 30 MG tablet Generic drug:  cinacalcet Take 1 tablet (30 mg total) by mouth daily. Notes to patient:  Last administered 11/11/16 morning   traMADol 50 MG tablet Commonly known as:  ULTRAM Take 1 tablet (50 mg total) by mouth every 6 (six) hours as needed.   VENTOLIN HFA 108 (90 Base) MCG/ACT inhaler Generic drug:  albuterol Inhale 2 puffs into the lungs daily as needed.       Vitals:   11/11/16 0520 11/11/16 1203  BP: (!) 163/92 (!) 143/82  Pulse: 62 60  Resp:     Temp:  97.4 F (36.3 C)    Skin clean, dry and intact without evidence of skin break down, no evidence of skin tears noted. IV catheter discontinued intact. Site without signs and symptoms of complications. Dressing and pressure applied. Pt denies pain at this time. No complaints noted.  An After Visit Summary was printed and given to the patient. Patient escorted via [place], and D/C home via private auto.  Dola Argyle

## 2016-11-12 NOTE — Discharge Summary (Signed)
[facility] NAME: Jasmine Holloway    MR#:  [id number]  DATE OF BIRTH:  02-Aug-1961  DATE OF ADMISSION:  11/10/2016   ADMITTING PHYSICIAN: Harvie Bridge, DO  DATE OF DISCHARGE: 11/11/2016  2:49 PM  PRIMARY CARE PHYSICIAN: Elyse Jarvis, MD   ADMISSION DIAGNOSIS:  Acute pulmonary edema ([place]) [J81.0] Hypoxia [R09.02] DISCHARGE DIAGNOSIS:  Active Problems:   Acute respiratory failure with hypoxia ([place])   Hypoxia  SECONDARY DIAGNOSIS:   Past Medical History:  Diagnosis Date  . Asthma   . CHF (congestive heart failure) ([place])   . COPD (chronic obstructive pulmonary disease) ([place])   . Diabetes mellitus without complication ([place])   . Diastolic heart failure ([place])   . ESRD (end stage renal disease) on dialysis ([place])   . Hypertension   . Neuropathy ([place])   . Polycystic kidney disease   . Renal insufficiency    HOSPITAL COURSE:  56 year old female with chronic diastolic heart failure and preserved ejection fraction, COPD on 2 L of oxygen at home, ESRD on hemodialysis admitted with shortness of breath.  1. Acute on chronic hypoxic respiratory failure in the setting of acute on chronic diastolic heart failure and COPD exacerbation.  2.acute on chronic heart failure, diastolic: well compensated post HD, EF 50-55%  3. Acute COPD exacerbation: improved with steroids  4. ESRD: hemodialysis as scheduled DISCHARGE CONDITIONS:  stable CONSULTS OBTAINED:  Treatment Team:  Anthonette Legato, MD DRUG ALLERGIES:   Allergies  Allergen Reactions  . Morphine And Related Nausea And Vomiting  . Strawberry Extract   . Tramadol Nausea And Vomiting  . Venofer [Iron Sucrose]   . Vicodin [Hydrocodone-Acetaminophen] Nausea And Vomiting  . Buprenorphine Hcl Nausea And Vomiting  . Codeine Nausea And Vomiting   DISCHARGE MEDICATIONS:   Allergies as of 11/11/2016      Reactions   Morphine And Related Nausea And Vomiting   Strawberry  Extract    Tramadol Nausea And Vomiting   Venofer [iron Sucrose]    Vicodin [hydrocodone-acetaminophen] Nausea And Vomiting   Buprenorphine Hcl Nausea And Vomiting   Codeine Nausea And Vomiting      Medication List    STOP taking these medications   cefdinir 300 MG capsule Commonly known as:  OMNICEF     TAKE these medications   acetaminophen 325 MG tablet Commonly known as:  TYLENOL Take 2 tablets (650 mg total) by mouth every 6 (six) hours as needed for mild pain or headache.   amLODipine 5 MG tablet Commonly known as:  NORVASC Take 5 mg by mouth daily. Notes to patient:  Last administered 11/11/16   aspirin 81 MG chewable tablet Chew 1 tablet (81 mg total) by mouth daily. Notes to patient:  Last administered 11/11/16   b complex-vitamin c-folic acid 0.8 MG Tabs tablet Take 1 tablet by mouth at bedtime. Notes to patient:  Last administered 11/10/16   calcium acetate 667 MG capsule Commonly known as:  PHOSLO Take 3 capsules (2,001 mg total) by mouth 3 (three) times daily with meals. What changed:  how much to take Notes to patient:  Last administered lunch time 11/11/16   cloNIDine 0.1 MG tablet Commonly known as:  CATAPRES Take 1 tablet (0.1 mg total) by mouth 2 (two) times daily. Notes to patient:  Last administered 11/11/16 morning   diphenhydrAMINE 25 mg capsule Commonly known as:  BENADRYL Take 2 capsules (50 mg total) by mouth every 6 (six) hours as needed.  furosemide 80 MG tablet Commonly known as:  LASIX Take 1 tablet (80 mg total) by mouth daily. Notes to patient:  Last administered 11/11/16 morning   gabapentin 300 MG capsule Commonly known as:  NEURONTIN Take 300 mg by mouth 2 (two) times daily. Notes to patient:  Last administered morning 11/11/16   hydrALAZINE 50 MG tablet Commonly known as:  APRESOLINE Take 50 mg by mouth 3 (three) times daily. Notes to patient:  Last administered morning 11/11/16   LORazepam 1 MG tablet Commonly known as:   ATIVAN Take 1 tablet (1 mg total) by mouth 2 (two) times daily as needed for anxiety.   metolazone 5 MG tablet Commonly known as:  ZAROXOLYN Take 1 tablet (5 mg total) by mouth daily as needed. What changed:  when to take this   metoprolol 50 MG tablet Commonly known as:  LOPRESSOR 50 mg 2 (two) times daily. Notes to patient:  Last administered 11/11/16 morning   omeprazole 40 MG capsule Commonly known as:  PRILOSEC Take 40 mg by mouth daily. Notes to patient:  Last administered 11/11/16 afternoon   ondansetron 4 MG tablet Commonly known as:  ZOFRAN Take 1 tablet (4 mg total) by mouth every 8 (eight) hours as needed for nausea or vomiting.   prochlorperazine 10 MG tablet Commonly known as:  COMPAZINE Take 1 tablet (10 mg total) by mouth every 6 (six) hours as needed for nausea.   promethazine 25 MG tablet Commonly known as:  PHENERGAN Take 1 tablet (25 mg total) by mouth every 6 (six) hours as needed for nausea or vomiting.   rosuvastatin 5 MG tablet Commonly known as:  CRESTOR Take 1 tablet (5 mg total) by mouth at bedtime. Notes to patient:  Last administered 11/10/16 bed time   SENSIPAR 30 MG tablet Generic drug:  cinacalcet Take 1 tablet (30 mg total) by mouth daily. Notes to patient:  Last administered 11/11/16 morning   traMADol 50 MG tablet Commonly known as:  ULTRAM Take 1 tablet (50 mg total) by mouth every 6 (six) hours as needed.   VENTOLIN HFA 108 (90 Base) MCG/ACT inhaler Generic drug:  albuterol Inhale 2 puffs into the lungs daily as needed.        DISCHARGE INSTRUCTIONS:   DIET:  Regular diet DISCHARGE CONDITION:  Good ACTIVITY:  Activity as tolerated OXYGEN:  Home Oxygen: No.  Oxygen Delivery: room air DISCHARGE LOCATION:  home   If you experience worsening of your admission symptoms, develop shortness of breath, life threatening emergency, suicidal or homicidal thoughts you must seek medical attention immediately by calling 911 or calling  your MD immediately  if symptoms less severe.  You Must read complete instructions/literature along with all the possible adverse reactions/side effects for all the Medicines you take and that have been prescribed to you. Take any new Medicines after you have completely understood and accpet all the possible adverse reactions/side effects.   Please note  You were cared for by a hospitalist during your hospital stay. If you have any questions about your discharge medications or the care you received while you were in the hospital after you are discharged, you can call the unit and asked to speak with the hospitalist on call if the hospitalist that took care of you is not available. Once you are discharged, your primary care physician will handle any further medical issues. Please note that NO REFILLS for any discharge medications will be authorized once you are discharged, as it is imperative  that you return to your primary care physician (or establish a relationship with a primary care physician if you do not have one) for your aftercare needs so that they can reassess your need for medications and monitor your lab values.    On the day of Discharge:  VITAL SIGNS:  Blood pressure (!) 143/82, pulse 60, temperature 97.4 F (36.3 C), temperature source Oral, resp. rate 20, height 5\' 3"  (1.6 m), weight 78.9 kg (173 lb 15.1 oz), SpO2 97 %. PHYSICAL EXAMINATION:  GENERAL:  56 y.o.-year-old patient lying in the bed with no acute distress.  EYES: Pupils equal, round, reactive to light and accommodation. No scleral icterus. Extraocular muscles intact.  HEENT: Head atraumatic, normocephalic. Oropharynx and nasopharynx clear.  NECK:  Supple, no jugular venous distention. No thyroid enlargement, no tenderness.  LUNGS: Normal breath sounds bilaterally, no wheezing, rales,rhonchi or crepitation. No use of accessory muscles of respiration.  CARDIOVASCULAR: S1, S2 normal. No murmurs, rubs, or gallops.  ABDOMEN:  Soft, non-tender, non-distended. Bowel sounds present. No organomegaly or mass.  EXTREMITIES: No pedal edema, cyanosis, or clubbing.  NEUROLOGIC: Cranial nerves II through XII are intact. Muscle strength 5/5 in all extremities. Sensation intact. Gait not checked.  PSYCHIATRIC: The patient is alert and oriented x 3.  SKIN: No obvious rash, lesion, or ulcer.  DATA REVIEW:   CBC  Recent Labs Lab 11/10/16 0640  WBC 15.0*  HGB 8.4*  HCT 26.1*  PLT 472*    Chemistries   Recent Labs Lab 11/10/16 0640 11/11/16 0454  NA 138 136  K 5.0 5.9*  CL 96* 95*  CO2 26 28  GLUCOSE 99 129*  BUN 56* 48*  CREATININE 13.00* 8.99*  CALCIUM 9.1 9.2  AST 16  --   ALT 10*  --   ALKPHOS 60  --   BILITOT 0.7  --     Follow-up Information    Elyse Jarvis, MD. Schedule an appointment as soon as possible for a visit on 11/20/2016.   Specialty:  Family Medicine Why:  @ 8:20 am.  Contact information: [address] [place] [address] [phone]          Management plans discussed with the patient, family and they are in agreement.  CODE STATUS: Prior   TOTAL TIME TAKING CARE OF THIS PATIENT: 45 minutes.    Max Sane M.D on 11/12/2016 at 8:15 PM  Between 7am to 6pm - Pager - [phone]  After 6pm go to [email or web address] - Proofreader  [facility]  Office  [phone]  CC: Primary care physician; Elyse Jarvis, MD   Note: This dictation was prepared with Dragon dictation along with smaller phrase technology. Any transcriptional errors that result from this process are unintentional.

## 2016-11-13 ENCOUNTER — Ambulatory Visit
Admission: RE | Admit: 2016-11-13 | Discharge: 2016-11-13 | Disposition: A | Discharge status: Home / Self Care | Payer: Medicare Other | Source: Ambulatory Visit | Attending: Family Medicine | Admitting: Family Medicine

## 2016-11-13 DIAGNOSIS — R928 Other abnormal and inconclusive findings on diagnostic imaging of breast: Secondary | ICD-10-CM | POA: Diagnosis present

## 2016-11-13 DIAGNOSIS — N6310 Unspecified lump in the right breast, unspecified quadrant: Secondary | ICD-10-CM

## 2016-11-13 DIAGNOSIS — I771 Stricture of artery: Secondary | ICD-10-CM | POA: Insufficient documentation

## 2016-11-14 ENCOUNTER — Emergency Department
Admission: EM | Admit: 2016-11-14 | Discharge: 2016-11-14 | Disposition: A | Discharge status: Home / Self Care | Payer: Medicare Other | Attending: Emergency Medicine | Admitting: Emergency Medicine

## 2016-11-14 ENCOUNTER — Emergency Department: Payer: Medicare Other

## 2016-11-14 ENCOUNTER — Other Ambulatory Visit: Payer: Self-pay

## 2016-11-14 DIAGNOSIS — Z7982 Long term (current) use of aspirin: Secondary | ICD-10-CM | POA: Insufficient documentation

## 2016-11-14 DIAGNOSIS — J449 Chronic obstructive pulmonary disease, unspecified: Secondary | ICD-10-CM | POA: Diagnosis not present

## 2016-11-14 DIAGNOSIS — J45909 Unspecified asthma, uncomplicated: Secondary | ICD-10-CM | POA: Diagnosis not present

## 2016-11-14 DIAGNOSIS — I509 Heart failure, unspecified: Secondary | ICD-10-CM | POA: Insufficient documentation

## 2016-11-14 DIAGNOSIS — R079 Chest pain, unspecified: Secondary | ICD-10-CM | POA: Diagnosis present

## 2016-11-14 DIAGNOSIS — Z79899 Other long term (current) drug therapy: Secondary | ICD-10-CM | POA: Insufficient documentation

## 2016-11-14 DIAGNOSIS — E1122 Type 2 diabetes mellitus with diabetic chronic kidney disease: Secondary | ICD-10-CM | POA: Diagnosis not present

## 2016-11-14 DIAGNOSIS — N186 End stage renal disease: Secondary | ICD-10-CM | POA: Insufficient documentation

## 2016-11-14 DIAGNOSIS — I132 Hypertensive heart and chronic kidney disease with heart failure and with stage 5 chronic kidney disease, or end stage renal disease: Secondary | ICD-10-CM | POA: Insufficient documentation

## 2016-11-14 DIAGNOSIS — Z87891 Personal history of nicotine dependence: Secondary | ICD-10-CM | POA: Diagnosis not present

## 2016-11-14 LAB — BASIC METABOLIC PANEL
Anion gap: 11 (ref 5–15)
BUN: 41 mg/dL — AB (ref 6–20)
CHLORIDE: 97 mmol/L — AB (ref 101–111)
CO2: 32 mmol/L (ref 22–32)
Calcium: 8.2 mg/dL — ABNORMAL LOW (ref 8.9–10.3)
Creatinine, Ser: 6.79 mg/dL — ABNORMAL HIGH (ref 0.44–1.00)
GFR calc Af Amer: 7 mL/min — ABNORMAL LOW (ref >60)
GFR, EST NON AFRICAN AMERICAN: 6 mL/min — AB (ref >60)
Glucose, Bld: 97 mg/dL (ref 65–99)
Potassium: 3.4 mmol/L — ABNORMAL LOW (ref 3.5–5.1)
Sodium: 140 mmol/L (ref 135–145)

## 2016-11-14 LAB — CBC
HEMATOCRIT: 26.9 % — AB (ref 35.0–47.0)
HEMOGLOBIN: 8.3 g/dL — AB (ref 12.0–16.0)
MCH: 27.4 pg (ref 26.0–34.0)
MCHC: 31 g/dL — AB (ref 32.0–36.0)
MCV: 88.3 fL (ref 80.0–100.0)
Platelets: 447 10*3/uL — ABNORMAL HIGH (ref 150–440)
RBC: 3.05 MIL/uL — AB (ref 3.80–5.20)
RDW: 19.3 % — ABNORMAL HIGH (ref 11.5–14.5)
WBC: 15.3 10*3/uL — ABNORMAL HIGH (ref 3.6–11.0)

## 2016-11-14 LAB — BRAIN NATRIURETIC PEPTIDE: B NATRIURETIC PEPTIDE 5: 220 pg/mL — AB (ref 0.0–100.0)

## 2016-11-14 LAB — TROPONIN I
TROPONIN I: 0.04 ng/mL — AB (ref <0.03)
Troponin I: 0.05 ng/mL (ref <0.03)

## 2016-11-14 NOTE — ED Provider Notes (Addendum)
[facility] Emergency Department Provider Note ____________________________________________   I have reviewed the triage vital signs and the triage nursing note.  HISTORY  Chief Complaint Chest Pain   Historian Patient  HPI Jasmine Holloway is a 56 y.o. female presents by EMS from dialysis this morning where she states she was receiving her dialysis, was asleep and woke up to central chest pain. This is happened to her before during dialysis. She also has a history of coronary artery disease and prior MI. She received nitroglycerin and 4 baby aspirin and feels better, no chest pain now. No nausea or vomiting or shortness of breath.      Past Medical History:  Diagnosis Date  . Asthma   . CHF (congestive heart failure) ([place])   . COPD (chronic obstructive pulmonary disease) ([place])   . Diabetes mellitus without complication ([place])   . Diastolic heart failure ([place])   . ESRD (end stage renal disease) on dialysis ([place])   . Hypertension   . Neuropathy ([place])   . Polycystic kidney disease   . Renal insufficiency     Patient Active Problem List   Diagnosis Date Noted  . Acute respiratory failure with hypoxia ([place]) 11/10/2016  . Hypoxia 11/10/2016  . Acute pulmonary edema ([place]) 07/04/2016  . Acute respiratory failure ([place]) 06/09/2016  . Atypical chest pain 06/08/2016  . Chronic pain syndrome 06/08/2016  . Anemia in stage 4 chronic kidney disease ([place]) 06/08/2016  . CHF exacerbation ([place]) 04/28/2016  . Epigastric pain 03/28/2016  . Acute CHF (congestive heart failure) ([place]) 03/28/2016  . CHF (congestive heart failure) ([place]) 03/28/2016  . Chronic diastolic heart failure ([place]) 02/04/2016  . Hoarseness of voice 02/04/2016  . Acute on chronic diastolic CHF (congestive heart failure) ([place]) 12/22/2015  . ESRD on dialysis ([place]) 12/22/2015  . Essential hypertension 12/22/2015  . Hyperlipidemia 12/22/2015    Past Surgical History:  Procedure Laterality Date  .  ARTERIOVENOUS GRAFT PLACEMENT Right    x3 (R forearm currently used for access)  . BREAST BIOPSY Left 12/03/2015   neg  . COLON SURGERY    . PERIPHERAL VASCULAR CATHETERIZATION Right 08/04/2016   Procedure: A/V Shuntogram;  Surgeon: Algernon Huxley, MD;  Location: [facility];  Service: Cardiovascular;  Laterality: Right;  . VEIN HARVEST      Prior to Admission medications   Medication Sig Start Date End Date Taking? Authorizing Provider  acetaminophen (TYLENOL) 325 MG tablet Take 2 tablets (650 mg total) by mouth every 6 (six) hours as needed for mild pain or headache. 03/18/16  Yes Nicholes Mango, MD  amLODipine (NORVASC) 5 MG tablet Take 5 mg by mouth daily. 08/31/14  Yes Historical Provider, MD  aspirin 81 MG chewable tablet Chew 1 tablet (81 mg total) by mouth daily. 01/17/16  Yes Gladstone Lighter, MD  b complex-vitamin c-folic acid (NEPHRO-VITE) 0.8 MG TABS tablet Take 1 tablet by mouth at bedtime.   Yes Historical Provider, MD  calcium acetate (PHOSLO) 667 MG capsule Take 3 capsules (2,001 mg total) by mouth 3 (three) times daily with meals. Patient taking differently: Take 667 mg by mouth 3 (three) times daily with meals.  07/07/16  Yes Fritzi Mandes, MD  cloNIDine (CATAPRES) 0.1 MG tablet Take 1 tablet (0.1 mg total) by mouth 2 (two) times daily. 01/17/16  Yes Gladstone Lighter, MD  diphenhydrAMINE (BENADRYL) 25 mg capsule Take 2 capsules (50 mg total) by mouth every 6 (six) hours as needed. 10/16/16  Yes Carrie Mew, MD  furosemide (LASIX) 80 MG tablet Take 1 tablet (80 mg total) by mouth daily. 03/05/16 11/10/17 Yes Alisa Graff, FNP  gabapentin (NEURONTIN) 300 MG capsule Take 300 mg by mouth 2 (two) times daily.    Yes Historical Provider, MD  hydrALAZINE (APRESOLINE) 50 MG tablet Take 50 mg by mouth 3 (three) times daily. 10/23/16  Yes Historical Provider, MD  metoprolol (LOPRESSOR) 50 MG tablet 50 mg 2 (two) times daily.  05/12/16  Yes Historical Provider, MD  omeprazole (PRILOSEC) 40  MG capsule Take 40 mg by mouth daily.    Yes Historical Provider, MD  ondansetron (ZOFRAN) 4 MG tablet Take 1 tablet (4 mg total) by mouth every 8 (eight) hours as needed for nausea or vomiting. 07/21/16  Yes Daymon Larsen, MD  oxyCODONE-acetaminophen (PERCOCET) 10-325 MG tablet Take 1 tablet by mouth daily as needed for pain.   Yes Historical Provider, MD  promethazine (PHENERGAN) 25 MG tablet Take 1 tablet (25 mg total) by mouth every 6 (six) hours as needed for nausea or vomiting. 10/16/16  Yes Carrie Mew, MD  rosuvastatin (CRESTOR) 5 MG tablet Take 1 tablet (5 mg total) by mouth at bedtime. 07/07/16  Yes Fritzi Mandes, MD  SENSIPAR 30 MG tablet Take 1 tablet (30 mg total) by mouth daily. 07/07/16  Yes Fritzi Mandes, MD  VENTOLIN HFA 108 (90 Base) MCG/ACT inhaler Inhale 2 puffs into the lungs daily as needed.  05/21/16  Yes Historical Provider, MD  LORazepam (ATIVAN) 1 MG tablet Take 1 tablet (1 mg total) by mouth 2 (two) times daily as needed for anxiety. Patient not taking: Reported on 08/18/2016 07/21/16   Daymon Larsen, MD  metolazone (ZAROXOLYN) 5 MG tablet Take 1 tablet (5 mg total) by mouth daily as needed. Patient not taking: Reported on 11/14/2016 06/08/16 11/10/17  Minna Merritts, MD  prochlorperazine (COMPAZINE) 10 MG tablet Take 1 tablet (10 mg total) by mouth every 6 (six) hours as needed for nausea. Patient not taking: Reported on 11/10/2016 10/15/16   Paulette Blanch, MD  traMADol (ULTRAM) 50 MG tablet Take 1 tablet (50 mg total) by mouth every 6 (six) hours as needed. Patient not taking: Reported on 08/18/2016 07/21/16   Daymon Larsen, MD    Allergies  Allergen Reactions  . Morphine And Related Nausea And Vomiting  . Strawberry Extract   . Tramadol Nausea And Vomiting  . Venofer [Iron Sucrose]   . Vicodin [Hydrocodone-Acetaminophen] Nausea And Vomiting  . Buprenorphine Hcl Nausea And Vomiting  . Codeine Nausea And Vomiting    Family History  Problem Relation Age of Onset  .  Hypertension Brother   . Heart failure Brother   . Hypertension Mother   . Heart disease Mother     Social History Social History  Substance Use Topics  . Smoking status: Former Smoker    Packs/day: 0.25    Quit date: 08/11/2015  . Smokeless tobacco: Never Used  . Alcohol use No    Review of Systems  Constitutional: Negative for fever. Eyes: Negative for visual changes. ENT: Negative for sore throat. Cardiovascular: As per history of present illness. Respiratory: Negative for shortness of breath. Gastrointestinal: Negative for abdominal pain, vomiting and diarrhea. Genitourinary: Negative for dysuria. Musculoskeletal: Negative for back pain. Skin: Negative for rash. Neurological: Negative for headache. 10 point Review of Systems otherwise negative ____________________________________________   PHYSICAL EXAM:  VITAL SIGNS: ED Triage Vitals  Enc Vitals Group     BP 11/14/16 0857 (!) 148/81  Pulse Rate 11/14/16 0857 74     Resp 11/14/16 0857 18     Temp 11/14/16 0857 98.2 F (36.8 C)     Temp Source 11/14/16 0857 Oral     SpO2 11/14/16 0857 91 %     Weight 11/14/16 0855 173 lb (78.5 kg)     Height 11/14/16 0855 5\' 3"  (1.6 m)     Head Circumference --      Peak Flow --      Pain Score 11/14/16 0906 4     Pain Loc --      Pain Edu? --      Excl. in [place]? --      Constitutional: Alert and oriented. Well appearing and in no distress. HEENT   Head: Normocephalic and atraumatic.      Eyes: Conjunctivae are normal. PERRL. Normal extraocular movements.      Ears:         Nose: No congestion/rhinnorhea.   Mouth/Throat: Mucous membranes are moist.   Neck: No stridor. Cardiovascular/Chest: Normal rate, regular rhythm.  No murmurs, rubs, or gallops. Respiratory: Normal respiratory effort without tachypnea nor retractions. Breath sounds are clear and equal bilaterally. No wheezes/rales/rhonchi. Gastrointestinal: Soft. No distention, no guarding, no rebound.  Nontender.    Genitourinary/rectal:Deferred Musculoskeletal: Left forearm fistula.  Nontender with normal range of motion in all extremities. No joint effusions.  No lower extremity tenderness.  No edema. Neurologic:  Normal speech and language. No gross or focal neurologic deficits are appreciated. Skin:  Skin is warm, dry and intact. No rash noted. Psychiatric: Mood and affect are normal. Speech and behavior are normal. Patient exhibits appropriate insight and judgment.   ____________________________________________  LABS (pertinent positives/negatives)  Labs Reviewed  BASIC METABOLIC PANEL - Abnormal; Notable for the following:       Result Value   Potassium 3.4 (*)    Chloride 97 (*)    BUN 41 (*)    Creatinine, Ser 6.79 (*)    Calcium 8.2 (*)    GFR calc non Af Amer 6 (*)    GFR calc Af Amer 7 (*)    All other components within normal limits  CBC - Abnormal; Notable for the following:    WBC 15.3 (*)    RBC 3.05 (*)    Hemoglobin 8.3 (*)    HCT 26.9 (*)    MCHC 31.0 (*)    RDW 19.3 (*)    Platelets 447 (*)    All other components within normal limits  TROPONIN I - Abnormal; Notable for the following:    Troponin I 0.05 (*)    All other components within normal limits  BRAIN NATRIURETIC PEPTIDE - Abnormal; Notable for the following:    B Natriuretic Peptide 220.0 (*)    All other components within normal limits  TROPONIN I - Abnormal; Notable for the following:    Troponin I 0.04 (*)    All other components within normal limits    ____________________________________________    EKG I, Lisa Roca, MD, the attending physician have personally viewed and interpreted all ECGs.  75 bpm normal sinus. Narrow QS. Normal axis. Normal ST and T-wave  Repeat EKG 65 bpm. Normal sinus pain or QS. Normal axis. Nonspecific ST and T-wave. ____________________________________________  RADIOLOGY All Xrays were viewed by me. Imaging interpreted by Radiologist.  CXR  portable: IMPRESSION: Stable mild cardiomegaly. Minimal improvement in diffuse hazy and linear parahilar lung opacities, suggesting slightly decreased mild pulmonary edema due to improving mild  congestive heart failure.  Aortic atherosclerosis. __________________________________________  PROCEDURES  Procedure(s) performed: None  Critical Care performed: None  ____________________________________________   ED COURSE / ASSESSMENT AND PLAN  Pertinent labs & imaging results that were available during my care of the patient were reviewed by me and considered in my medical decision making (see chart for details).   Chest pain free after an episode of chest discomfort during dialysis. She does have a cardiac history and from prior office know, it appears Dr. Rockey Situ and his nurse practitioner are her cardiologist.  EKG reassuring.   Patient remains a symptomatic and repeat troponin is 0.04, down from 0.05 in the setting of chronic minimally elevated troponin. Okay for discharge and outpatient follow-up.    CONSULTATIONS:   None  Patient / Family / Caregiver informed of clinical course, medical decision-making process, and agree with plan.   I discussed return precautions, follow-up instructions, and discharge instructions with patient and/or family.  Discharge instructions:  You were evaluated for chest discomfort during dialysis, and your exam and evaluation are reassuring in the emergency department today.  Return to the emergency department immediately for any worsening chest pain, nausea, sweats, trouble breathing, shortness of breath, abdominal pain, or any other symptoms concerning to you. Neck supple Please follow up with your cardiologist, Dr. Donivan Scull office number is provided. ___________________________________________   FINAL CLINICAL IMPRESSION(S) / ED DIAGNOSES   Final diagnoses:  Nonspecific chest pain              Note: This dictation was  prepared with Dragon dictation. Any transcriptional errors that result from this process are unintentional    Lisa Roca, MD 11/14/16 [place], MD 11/14/16 [phone]

## 2016-11-14 NOTE — ED Triage Notes (Addendum)
PT came to ED via EMS from dialysis c/o chest pain. Pt recently admitted for CHF exacerbation. VS stable. Wears 02 as needed. Given 1 nitro at dialysis and 324 asa by EMS.

## 2016-11-14 NOTE — ED Notes (Signed)
Placed on chronic O2 at 2L for O2 sat 90%. Pt states she uses it PRN.

## 2016-11-14 NOTE — Discharge Instructions (Signed)
You were evaluated for chest discomfort during dialysis, and your exam and evaluation are reassuring in the emergency department today.  Return to the emergency department immediately for any worsening chest pain, nausea, sweats, trouble breathing, shortness of breath, abdominal pain, or any other symptoms concerning to you. Neck supple Please follow up with your cardiologist, Dr. Donivan Scull office number is provided.

## 2016-11-14 NOTE — ED Notes (Addendum)
See triage note. Pt c/o mid-sternal, non-radiating chest pain that started midway through pt's dialysis appointment today. Pt states she completed 2 hours out of typically 3.25-hr treatment. Very mild non-pitting edema noted to face and BUE. +non-productive cough. Lung sounds clear bilaterally. x2 unsuccessful IV stick, one by this RN and one by different RN. Pt states she is a very difficult stick usually. Per MD ok to wait for blood test results before attempting IV placement again.

## 2016-11-17 ENCOUNTER — Telehealth: Payer: Self-pay | Admitting: Cardiovascular Disease

## 2016-11-17 NOTE — Telephone Encounter (Signed)
Lmov for patient to call back she was seen in ED on 11/14/16 for CP Will try again at a later time

## 2016-11-20 NOTE — Telephone Encounter (Signed)
Pt is coming in on 12/07/16 to see Dr Rockey Situ

## 2016-12-05 NOTE — Progress Notes (Deleted)
Cardiology Office Note  Date:  12/05/2016   ID:  Jasmine Holloway, DOB 1961-02-28, MRN [id number]  PCP:  Elyse Jarvis, MD   No chief complaint on file.   HPI:  Jasmine Holloway  is a 56 y.o. female with a known history of  No CAD by cardiac cath 03/2016 Chronic atypical chest pain on HD Normal EF by echo 11/10/16, >50% Asthma,  COPD,  HTN DM, Polycystic kidney disease, ESRD on HD on T,T,S, Hospital admissions for fluid overload/pulmonary edema On HD Tu, Th, Sat Anemia chronic left hand pain, ruptured AV access left arm,  who presents for f/u of her chest pain  Seen in the ER 11/14/16 for chest pain Hospital records reviewed with the patient in detail presents by EMS from dialysis, she was receiving her dialysis, was asleep and woke up to central chest pain. This is happened to her before during dialysis.   Admission for pulmonary Edema 11/10/2016 Copd, pulm edema in esrd on HD Hospital records reviewed with the patient in detail     hospital admission June 2017 for shortness of breath, started on BiPAP  she has chronic Anemia, HBG 9.9 (stable) Echo 01/11/2016 moderate LVH, ejection fraction not detailed in the report though presumed to be normal , fractional shortening suggesting normal EF  Catheterization by outside group 03/17/2016 showing no coronary disease   Her main complaint isCramping in chest, left arm in her left pectoral region, and are her left shoulder  She reports this is the side of her previous HD fistula  Currently has HD fistula right forearm,  She has chronic cramping and has severe pain left arm during HD, only has enough pain medication  for one pill a day. She does not know whether to take it before dialysis or after as she is concerned HD will clear the medication and she will have none for the rest the day but she has severe pain during hemodialysis   Left AV fistula not working, chronic pain ("blew out in [place]"), scar tissue,  Trouble moving fingers left  hand  Seen by pain clinic for pain in her left pectoral, left arm,  reports she only has onepercocet/oxycodone provided per day . She does not feel this is enough . No other options were provided per the patient    she reports that HD was cut short on Saturday, she was having severe cramping They did not pull enough. Now getting short of breath today, short of breath last night Takes lasix daily, 80 mg   Reports she is On transplant list for kidney   EKG on today's visit shows normal sinus rhythm with rate 59 bpm, LVH with repolarization abnormality   PMH:   has a past medical history of Asthma; CHF (congestive heart failure) ([place]); COPD (chronic obstructive pulmonary disease) ([place]); Diabetes mellitus without complication ([place]); Diastolic heart failure ([place]); ESRD (end stage renal disease) on dialysis ([place]); Hypertension; Neuropathy; Polycystic kidney disease; and Renal insufficiency.  PSH:    Past Surgical History:  Procedure Laterality Date  . ARTERIOVENOUS GRAFT PLACEMENT Right    x3 (R forearm currently used for access)  . BREAST BIOPSY Left 12/03/2015   neg  . COLON SURGERY    . PERIPHERAL VASCULAR CATHETERIZATION Right 08/04/2016   Procedure: A/V Shuntogram;  Surgeon: Algernon Huxley, MD;  Location: [facility];  Service: Cardiovascular;  Laterality: Right;  . VEIN HARVEST      Current Outpatient Prescriptions  Medication Sig Dispense Refill  . acetaminophen (  TYLENOL) 325 MG tablet Take 2 tablets (650 mg total) by mouth every 6 (six) hours as needed for mild pain or headache.    Marland Kitchen amLODipine (NORVASC) 5 MG tablet Take 5 mg by mouth daily.    Marland Kitchen aspirin 81 MG chewable tablet Chew 1 tablet (81 mg total) by mouth daily. 30 tablet 2  . b complex-vitamin c-folic acid (NEPHRO-VITE) 0.8 MG TABS tablet Take 1 tablet by mouth at bedtime.    . calcium acetate (PHOSLO) 667 MG capsule Take 3 capsules (2,001 mg total) by mouth 3 (three) times daily with meals. (Patient taking  differently: Take 667 mg by mouth 3 (three) times daily with meals. ) 90 capsule 1  . cloNIDine (CATAPRES) 0.1 MG tablet Take 1 tablet (0.1 mg total) by mouth 2 (two) times daily. 60 tablet 2  . diphenhydrAMINE (BENADRYL) 25 mg capsule Take 2 capsules (50 mg total) by mouth every 6 (six) hours as needed. 60 capsule 0  . furosemide (LASIX) 80 MG tablet Take 1 tablet (80 mg total) by mouth daily. 90 tablet 3  . gabapentin (NEURONTIN) 300 MG capsule Take 300 mg by mouth 2 (two) times daily.     . hydrALAZINE (APRESOLINE) 50 MG tablet Take 50 mg by mouth 3 (three) times daily.    Marland Kitchen LORazepam (ATIVAN) 1 MG tablet Take 1 tablet (1 mg total) by mouth 2 (two) times daily as needed for anxiety. (Patient not taking: Reported on 08/18/2016) 10 tablet 0  . metolazone (ZAROXOLYN) 5 MG tablet Take 1 tablet (5 mg total) by mouth daily as needed. (Patient not taking: Reported on 11/14/2016) 30 tablet 2  . metoprolol (LOPRESSOR) 50 MG tablet 50 mg 2 (two) times daily.     Marland Kitchen omeprazole (PRILOSEC) 40 MG capsule Take 40 mg by mouth daily.     . ondansetron (ZOFRAN) 4 MG tablet Take 1 tablet (4 mg total) by mouth every 8 (eight) hours as needed for nausea or vomiting. 21 tablet 0  . oxyCODONE-acetaminophen (PERCOCET) 10-325 MG tablet Take 1 tablet by mouth daily as needed for pain.    Marland Kitchen prochlorperazine (COMPAZINE) 10 MG tablet Take 1 tablet (10 mg total) by mouth every 6 (six) hours as needed for nausea. (Patient not taking: Reported on 11/10/2016) 20 tablet 0  . promethazine (PHENERGAN) 25 MG tablet Take 1 tablet (25 mg total) by mouth every 6 (six) hours as needed for nausea or vomiting. 15 tablet 0  . rosuvastatin (CRESTOR) 5 MG tablet Take 1 tablet (5 mg total) by mouth at bedtime. 30 tablet 0  . SENSIPAR 30 MG tablet Take 1 tablet (30 mg total) by mouth daily. 60 tablet 1  . traMADol (ULTRAM) 50 MG tablet Take 1 tablet (50 mg total) by mouth every 6 (six) hours as needed. (Patient not taking: Reported on 08/18/2016) 20  tablet 0  . VENTOLIN HFA 108 (90 Base) MCG/ACT inhaler Inhale 2 puffs into the lungs daily as needed.      No current facility-administered medications for this visit.      Allergies:   Morphine and related; Strawberry extract; Tramadol; Venofer [iron sucrose]; Vicodin [hydrocodone-acetaminophen]; Buprenorphine hcl; and Codeine   Social History:  The patient  reports that she quit smoking about 15 months ago. She smoked 0.25 packs per day. She has never used smokeless tobacco. She reports that she does not drink alcohol or use drugs.   Family History:   family history includes Heart disease in her mother; Heart failure in her  brother; Hypertension in her brother and mother.    Review of Systems: Review of Systems  Constitutional: Negative.   Respiratory: Positive for shortness of breath.   Cardiovascular: Negative.   Gastrointestinal: Negative.   Musculoskeletal: Positive for joint pain.       Arm pain, muscle cramps  Neurological: Negative.   Psychiatric/Behavioral: Negative.   All other systems reviewed and are negative.    PHYSICAL EXAM: VS:  There were no vitals taken for this visit. , BMI There is no height or weight on file to calculate BMI. GEN: Well nourished, well developed, in no acute distress , talkative, obese  HEENT: normal  Neck: no JVD, carotid bruits, or masses Cardiac: RRR; no murmurs, rubs, or gallops, trace edema  Respiratory:  clear to auscultation bilaterally, normal work of breathing GI: soft, nontender, nondistended, + BS MS: no deformity or atrophy  Skin: warm and dry, no rash Neuro:  Strength and sensation are intact Psych: euthymic mood, full affect    Recent Labs: 07/04/2016: Magnesium 2.0 11/10/2016: ALT 10; TSH 1.838 11/14/2016: B Natriuretic Peptide 220.0; BUN 41; Creatinine, Ser 6.79; Hemoglobin 8.3; Platelets 447; Potassium 3.4; Sodium 140    Lipid Panel Lab Results  Component Value Date   CHOL 161 11/10/2016   HDL 36 (L) 11/10/2016    LDLCALC 92 11/10/2016   TRIG 165 (H) 11/10/2016      Wt Readings from Last 3 Encounters:  11/14/16 173 lb (78.5 kg)  11/10/16 173 lb 15.1 oz (78.9 kg)  10/17/16 181 lb (82.1 kg)       ASSESSMENT AND PLAN:  Chronic diastolic heart failure (HCC) - Plan: EKG 12-Lead Fluid status managed by hemodialysis. Hemodialysis periodically limited by severe cramping leading to shortness of breath the next few days. Currently having shortness of breath last night, today. Recommended she take Lasix twice a day. For severe episodes, recommended she take with metolazone. 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 She sees the pain clinic, does not feel she has enough pain medication  ESRD on dialysis ([place]) 3 days per week, also makes urine, on Lasix  Chronic pain syndrome  Anemia in stage 4 chronic kidney disease (HCC) Chronic stable anemia, likely contributing to shortness of breath, leg swelling, chronic CHF symptoms   Total encounter time more than 45 minutes  Greater than 50% was spent in counseling and coordination of care with the patient    Disposition:   F/U  6 months   No orders of the defined types were placed in this encounter.    Signed, Esmond Plants, M.D., Ph.D. 12/05/2016  [place], [place]

## 2016-12-07 ENCOUNTER — Ambulatory Visit: Payer: Medicare Other | Admitting: Cardiovascular Disease

## 2016-12-22 ENCOUNTER — Encounter: Payer: Self-pay | Admitting: Gastroenterology

## 2016-12-22 ENCOUNTER — Ambulatory Visit (INDEPENDENT_AMBULATORY_CARE_PROVIDER_SITE_OTHER): Payer: Medicare Other | Admitting: Gastroenterology

## 2016-12-22 ENCOUNTER — Other Ambulatory Visit: Payer: Self-pay

## 2016-12-22 VITALS — BP 162/87 | HR 69 | Temp 97.6°F | Ht 63.0 in | Wt 182.4 lb

## 2016-12-22 DIAGNOSIS — D649 Anemia, unspecified: Secondary | ICD-10-CM | POA: Diagnosis not present

## 2016-12-22 NOTE — Progress Notes (Signed)
Gastroenterology Consultation  Referring Provider:     Theotis Burrow* Primary Care Physician:  Theotis Burrow, MD Primary Gastroenterologist:  Dr. Jonathon Bellows  Reason for Consultation:     Anemia         HPI:   Jasmine Holloway is a 56 y.o. y/o female referred for consultation & management  by Dr. Alene Mires, Elyse Jarvis, MD.    She has a history of ESRD on dialysis awaiting a renal transplant. She had a recent admission in 11/2016 for COPD ex , CHF.  Recent labs Hb 8.3 grams ,MCV 88 , plt 447 , Recent few troponin values elevated. She has been referred to me for acute drop in Hb. No recent iron studies. I note that her Hb was 10.8 grams in 10/2016 .  She says when she coughs , the phlegm is red in color, just this morning . Not noticed it before, she has not thrown up any blood, her stool is tan in color, not black , no blood in stools. She is not in any blood thinners.   She can walk 5-7 steps before she gets short of breath. She says her brother died on 11-18-2022 .   She has not lost weight , She says she had an EGD last year cant recall where but she recalls was normal.  Colonoscopy in 12/2015 t [facility] and they found a small 4 mm polyp , mild diverticulosis of the colon  Otherwise was normal   Past Medical History:  Diagnosis Date  . Asthma   . CHF (congestive heart failure) ([place])   . COPD (chronic obstructive pulmonary disease) ([place])   . Diabetes mellitus without complication ([place])   . Diastolic heart failure ([place])   . ESRD (end stage renal disease) on dialysis ([place])   . Hypertension   . Neuropathy   . Polycystic kidney disease   . Renal insufficiency     Past Surgical History:  Procedure Laterality Date  . ARTERIOVENOUS GRAFT PLACEMENT Right    x3 (R forearm currently used for access)  . BREAST BIOPSY Left 12/03/2015   neg  . COLON SURGERY    . PERIPHERAL VASCULAR CATHETERIZATION Right 08/04/2016   Procedure: A/V Shuntogram;  Surgeon: Algernon Huxley, MD;   Location: [facility];  Service: Cardiovascular;  Laterality: Right;  . VEIN HARVEST      Prior to Admission medications   Medication Sig Start Date End Date Taking? Authorizing Provider  acetaminophen (TYLENOL) 325 MG tablet Take 2 tablets (650 mg total) by mouth every 6 (six) hours as needed for mild pain or headache. 03/18/16  Yes Gouru, Aruna, MD  amitriptyline (ELAVIL) 10 MG tablet  11/20/16  Yes [provider]  amLODipine (NORVASC) 5 MG tablet Take 5 mg by mouth daily. 08/31/14  Yes [provider]  aspirin 81 MG chewable tablet Chew 1 tablet (81 mg total) by mouth daily. 01/17/16  Yes Gladstone Lighter, MD  b complex-vitamin c-folic acid (NEPHRO-VITE) 0.8 MG TABS tablet Take 1 tablet by mouth at bedtime.   Yes [provider]  calcium acetate (PHOSLO) 667 MG capsule Take 3 capsules (2,001 mg total) by mouth 3 (three) times daily with meals. Patient taking differently: Take 667 mg by mouth 3 (three) times daily with meals.  07/07/16  Yes Fritzi Mandes, MD  cloNIDine (CATAPRES) 0.1 MG tablet Take 1 tablet (0.1 mg total) by mouth 2 (two) times daily. 01/17/16  Yes Gladstone Lighter, MD  diphenhydrAMINE (BENADRYL) 25  mg capsule Take 2 capsules (50 mg total) by mouth every 6 (six) hours as needed. 10/16/16  Yes Carrie Mew, MD  furosemide (LASIX) 80 MG tablet Take 1 tablet (80 mg total) by mouth daily. 03/05/16 11/10/17 Yes Hackney, Otila Kluver A, FNP  gabapentin (NEURONTIN) 300 MG capsule Take 300 mg by mouth 2 (two) times daily.    Yes [provider]  hydrALAZINE (APRESOLINE) 50 MG tablet Take 50 mg by mouth 3 (three) times daily. 10/23/16  Yes [provider]  lidocaine (XYLOCAINE) 5 % ointment Apply topically.   Yes [provider]  LORazepam (ATIVAN) 1 MG tablet Take 1 tablet (1 mg total) by mouth 2 (two) times daily as needed for anxiety. 07/21/16  Yes Daymon Larsen, MD  metolazone (ZAROXOLYN) 5 MG tablet Take 1 tablet (5 mg total) by  mouth daily as needed. 06/08/16 11/10/17 Yes Gollan, Kathlene November, MD  metoprolol succinate (TOPROL-XL) 50 MG 24 hr tablet  11/27/16  Yes [provider]  omeprazole (PRILOSEC) 20 MG capsule  12/20/16  Yes [provider]  ondansetron (ZOFRAN) 4 MG tablet Take 1 tablet (4 mg total) by mouth every 8 (eight) hours as needed for nausea or vomiting. 07/21/16  Yes Daymon Larsen, MD  oxyCODONE-acetaminophen (PERCOCET) 10-325 MG tablet Take 1 tablet by mouth daily as needed for pain.   Yes [provider]  rosuvastatin (CRESTOR) 5 MG tablet Take 1 tablet (5 mg total) by mouth at bedtime. 07/07/16  Yes Fritzi Mandes, MD  SENSIPAR 30 MG tablet Take 1 tablet (30 mg total) by mouth daily. 07/07/16  Yes Fritzi Mandes, MD  traMADol (ULTRAM) 50 MG tablet Take 1 tablet (50 mg total) by mouth every 6 (six) hours as needed. 07/21/16  Yes Daymon Larsen, MD  VENTOLIN HFA 108 [phone] Base) MCG/ACT inhaler Inhale 2 puffs into the lungs daily as needed.  05/21/16  Yes [provider]  prochlorperazine (COMPAZINE) 10 MG tablet Take 1 tablet (10 mg total) by mouth every 6 (six) hours as needed for nausea. Patient not taking: Reported on 12/22/2016 10/15/16   Paulette Blanch, MD  promethazine (PHENERGAN) 25 MG tablet Take 1 tablet (25 mg total) by mouth every 6 (six) hours as needed for nausea or vomiting. Patient not taking: Reported on 12/22/2016 10/16/16   Carrie Mew, MD    Family History  Problem Relation Age of Onset  . Hypertension Brother   . Heart failure Brother   . Hypertension Mother   . Heart disease Mother      Social History  Substance Use Topics  . Smoking status: Former Smoker    Packs/day: 0.25    Quit date: 08/11/2015  . Smokeless tobacco: Never Used  . Alcohol use No    Allergies as of 12/22/2016 - Review Complete 12/22/2016  Allergen Reaction Noted  . Morphine and related Nausea And Vomiting 02/24/2015  . Strawberry extract  08/18/2016  . Tramadol Nausea And  Vomiting 08/05/2016  . Venofer [iron sucrose]  08/18/2016  . Vicodin [hydrocodone-acetaminophen] Nausea And Vomiting 02/24/2015  . Buprenorphine hcl Nausea And Vomiting 12/21/2015  . Codeine Nausea And Vomiting 12/21/2015  . Morphine Nausea And Vomiting 10/24/2014    Review of Systems:    All systems reviewed and negative except where noted in HPI.   Physical Exam:  BP (!) 162/87 (BP Location: Left Arm, Patient Position: Sitting, Cuff Size: Large)   Pulse 69   Temp 97.6 F (36.4 C) (Oral)   Ht 5'  3" (1.6 m)   Wt 182 lb 6.4 oz (82.7 kg)   BMI 32.31 kg/m  No LMP recorded. Patient has had a hysterectomy. Psych:  Alert and cooperative. Normal mood and affect. General:   Alert,  Well-developed, well-nourished, pleasant and cooperative in NAD Head:  Normocephalic and atraumatic. Eyes:  Sclera clear, no icterus.   Conjunctiva pink. Ears:  Normal auditory acuity. Nose:  No deformity, discharge, or lesions. Mouth:  No deformity or lesions,oropharynx pink & moist. Neck:  Supple; no masses or thyromegaly. Lungs:  Respirations even and unlabored.  Clear throughout to auscultation.   No wheezes, crackles, or rhonchi. No acute distress. Heart:  Regular rate and rhythm; no murmurs, clicks, rubs, or gallops. Abdomen:  Normal bowel sounds.  No bruits.  Soft, non-tender and non-distended without masses, hepatosplenomegaly or hernias noted.  No guarding or rebound tenderness.    Msk:  Symmetrical without gross deformities. Good, equal movement & strength bilaterally. Neurologic:  Alert and oriented x3;  grossly normal neurologically. Psych:  Alert and cooperative. Normal mood and affect. Imaging Studies: No results found.  Assessment and Plan:   Layli Capshaw is a 56 y.o. y/o female has been referred for acute drop in Hb with no overt GI blood loss, no iron studies available. No overt blood loss. Unclear etiology for acute drop in HB, She has had copd ex , CHF recently with elevated troponins.  Recent colonoscopy 1 year back.  Plan   1. Iron studies, b12,folate , tsh , celiac serology .   2. EGD after cardiac and pulmonary clearance, if EGD normal, we can follow CBC and if drops yet again can consider then repeat colonoscopy +/- capsule study   3. To follow up with PCP , if she has persistence of hemoptysis she will need evaluation of the same.   Follow up in 6-8 weeks.   Dr Jonathon Bellows MD

## 2016-12-23 ENCOUNTER — Other Ambulatory Visit
Admission: RE | Admit: 2016-12-23 | Discharge: 2016-12-23 | Disposition: A | Discharge status: Home / Self Care | Payer: Medicare Other | Source: Ambulatory Visit | Attending: Gastroenterology | Admitting: Gastroenterology

## 2016-12-23 DIAGNOSIS — D649 Anemia, unspecified: Secondary | ICD-10-CM | POA: Insufficient documentation

## 2016-12-23 LAB — FERRITIN: FERRITIN: 687 ng/mL — AB (ref 11–307)

## 2016-12-23 LAB — IRON AND TIBC
Iron: 63 ug/dL (ref 28–170)
SATURATION RATIOS: 27 % (ref 10.4–31.8)
TIBC: 233 ug/dL — ABNORMAL LOW (ref 250–450)
UIBC: 170 ug/dL

## 2016-12-24 ENCOUNTER — Other Ambulatory Visit: Payer: Self-pay

## 2016-12-24 ENCOUNTER — Ambulatory Visit: Payer: Medicare Other | Admitting: Cardiovascular Disease

## 2016-12-24 ENCOUNTER — Telehealth: Payer: Self-pay

## 2016-12-24 DIAGNOSIS — D649 Anemia, unspecified: Secondary | ICD-10-CM

## 2016-12-24 LAB — FOLATE RBC
Folate, Hemolysate: >620 ng/mL
HEMATOCRIT: 30 % — AB (ref 34.0–46.6)

## 2016-12-24 LAB — CELIAC PANEL 10
ENDOMYSIAL ANTIBODY IGA: NEGATIVE
GLIADIN IGA: 5 U (ref 0–19)
GLIADIN IGG: 2 U (ref 0–19)
IgA: 267 mg/dL (ref 87–352)
Tissue Transglutaminase Ab, IgA: <2 U/mL (ref 0–3)

## 2016-12-24 NOTE — Telephone Encounter (Signed)
Pt has been scheduled for Pre-Op testing on May 31st @ 130pm.   LVM for patient callback to verify receipt of information.

## 2016-12-31 ENCOUNTER — Telehealth: Payer: Self-pay

## 2016-12-31 NOTE — Telephone Encounter (Signed)
Advised patient of lab results per Dr. Vicente Males.    No iron deficiency , celiac disease and normal folate

## 2016-12-31 NOTE — Telephone Encounter (Signed)
-----   Message from Jonathon Bellows, MD sent at 12/30/2016 11:29 AM EDT ----- No iron deficiency , celiac disease and normal folate

## 2017-01-07 ENCOUNTER — Inpatient Hospital Stay: Admission: RE | Admit: 2017-01-07 | Payer: Medicare Other | Source: Ambulatory Visit

## 2017-01-11 ENCOUNTER — Encounter
Admission: RE | Admit: 2017-01-11 | Discharge: 2017-01-11 | Disposition: A | Discharge status: Home / Self Care | Payer: Medicare Other | Source: Ambulatory Visit | Attending: Anesthesiology | Admitting: Anesthesiology

## 2017-01-11 NOTE — Pre-Procedure Instructions (Addendum)
PATIENT SEEN BY DR Ronelle Nigh AND PATIENT DOES NEED CARDIAC AND PULMONARY CLEARANCE PRIOR TO PROCEDURE.CALLED AND FAXED INFO TO DR Jonathon Bellows. SPOKE WITH TONYA

## 2017-01-12 ENCOUNTER — Telehealth: Payer: Self-pay | Admitting: Cardiovascular Disease

## 2017-01-12 NOTE — Telephone Encounter (Signed)
Received cardiac clearance request for pt to proceed w/ EGD on 02/04/17 under general anesthesia w/ Dr. Vicente Males. Per Pre-Admit testing, cardiac clearance is required prior to proceeding. Please route clearance to [facility] @ [phone].

## 2017-01-13 NOTE — Progress Notes (Signed)
Cardiology Office Note  Date:  01/14/2017   ID:  Jasmine Holloway, DOB 11-Oct-1960, MRN [id number]  PCP:  Theotis Burrow, MD   Chief Complaint  Patient presents with  . other    6 month follow up. Meds reviewed by the pt. verbally. Pt. c/o shortness of breath, chest pain and bilateral leg edema with more on the left. Needs a cardiac clearance for a esophagogastroduodenoscopy that is scheduled for February 04, 2017    HPI:  Jasmine Holloway  is a 56 y.o. female with a known history of  Asthma,  COPD,  DM,  ESRD on HD on T,T,S, on transplant list HTN ,  Catheterization by outside group 03/17/2016 showing no coronary disease  Polycystic kidney disease,  Hospital admissions for fluid overload. On hemodialysis Tuesday, Thursday, Saturday  chronic left hand pain, ruptured AV access left arm,  Previous Complications for chemical stress test Currently has HD fistula right forearm,  She has chronic cramping , followed by the pain clinic Reports she is On transplant list for kidney  who presents  for follow-up of her chest pain  Emergency room visit 11/14/2016 She reported having  Chest pain during dialysis Hospital records reviewed with the patient in detail BNP improved down to 220, was greater than 1700 last year Receiving dialysis, woke up to chest pain, happen before Workup in the emergency room was unrevealing, cardiac enzymes negative  Did not take her medications this morning, transportation arrived to early She has already completed dialysis this morning and is going home Still has some leg swelling, shortness of breath Dialysis being terminated prematurely secondary to cramping in her legs  She does take Lasix at times for swelling   hospital admission June 2017 for shortness of breath, started on BiPAP  she has chronic Anemia, HBG 9.9 (stable)  EKG personally reviewed by myself on todays visit NSR with rate 74 bpm with prolonged QTc  Other past medical history reviewed Echo  01/11/2016 moderate LVH, ejection fraction not detailed in the report though presumed to be normal , fractional shortening suggesting normal EF  Left AV fistula not working, chronic pain ("blew out in [place]"), scar tissue,  Trouble moving fingers left hand  Seen by pain clinic for pain in her left pectoral, left arm,  reports she only has onepercocet/oxycodone provided per day . She does not feel this is enough . No other options were provided per the patient     PMH:   has a past medical history of Asthma; CHF (congestive heart failure) ([place]); COPD (chronic obstructive pulmonary disease) ([place]); Diabetes mellitus without complication ([place]); Diastolic heart failure ([place]); ESRD (end stage renal disease) on dialysis ([place]); Hypertension; Neuropathy; Polycystic kidney disease; and Renal insufficiency.  PSH:    Past Surgical History:  Procedure Laterality Date  . ARTERIOVENOUS GRAFT PLACEMENT Right    x3 (R forearm currently used for access)  . BREAST BIOPSY Left 12/03/2015   neg  . COLON SURGERY    . PERIPHERAL VASCULAR CATHETERIZATION Right 08/04/2016   Procedure: A/V Shuntogram;  Surgeon: Algernon Huxley, MD;  Location: [facility];  Service: Cardiovascular;  Laterality: Right;  . VEIN HARVEST      Current Outpatient Prescriptions  Medication Sig Dispense Refill  . acetaminophen (TYLENOL) 325 MG tablet Take 2 tablets (650 mg total) by mouth every 6 (six) hours as needed for mild pain or headache.    Marland Kitchen amitriptyline (ELAVIL) 10 MG tablet     . amLODipine (NORVASC) 5  MG tablet Take 5 mg by mouth daily.    Marland Kitchen aspirin 81 MG chewable tablet Chew 1 tablet (81 mg total) by mouth daily. 30 tablet 2  . b complex-vitamin c-folic acid (NEPHRO-VITE) 0.8 MG TABS tablet Take 1 tablet by mouth at bedtime.    . calcium acetate (PHOSLO) 667 MG capsule Take 3 capsules (2,001 mg total) by mouth 3 (three) times daily with meals. (Patient taking differently: Take 667 mg by mouth 3 (three) times daily  with meals. ) 90 capsule 1  . cloNIDine (CATAPRES) 0.1 MG tablet Take 1 tablet (0.1 mg total) by mouth 2 (two) times daily. 60 tablet 2  . diphenhydrAMINE (BENADRYL) 25 mg capsule Take 2 capsules (50 mg total) by mouth every 6 (six) hours as needed. 60 capsule 0  . furosemide (LASIX) 80 MG tablet Take 1 tablet (80 mg total) by mouth daily. 90 tablet 3  . gabapentin (NEURONTIN) 300 MG capsule Take 300 mg by mouth 2 (two) times daily.     . hydrALAZINE (APRESOLINE) 50 MG tablet Take 50 mg by mouth 3 (three) times daily.    Marland Kitchen lidocaine (XYLOCAINE) 5 % ointment Apply topically.    Marland Kitchen LORazepam (ATIVAN) 1 MG tablet Take 1 tablet (1 mg total) by mouth 2 (two) times daily as needed for anxiety. 10 tablet 0  . metolazone (ZAROXOLYN) 5 MG tablet Take 1 tablet (5 mg total) by mouth daily as needed. 30 tablet 2  . metoprolol succinate (TOPROL-XL) 50 MG 24 hr tablet     . omeprazole (PRILOSEC) 20 MG capsule     . ondansetron (ZOFRAN) 4 MG tablet Take 1 tablet (4 mg total) by mouth every 8 (eight) hours as needed for nausea or vomiting. 21 tablet 0  . oxyCODONE-acetaminophen (PERCOCET) 10-325 MG tablet Take 1 tablet by mouth daily as needed for pain.    Marland Kitchen prochlorperazine (COMPAZINE) 10 MG tablet Take 1 tablet (10 mg total) by mouth every 6 (six) hours as needed for nausea. 20 tablet 0  . promethazine (PHENERGAN) 25 MG tablet Take 1 tablet (25 mg total) by mouth every 6 (six) hours as needed for nausea or vomiting. 15 tablet 0  . rosuvastatin (CRESTOR) 5 MG tablet Take 1 tablet (5 mg total) by mouth at bedtime. 30 tablet 0  . SENSIPAR 30 MG tablet Take 1 tablet (30 mg total) by mouth daily. 60 tablet 1  . traMADol (ULTRAM) 50 MG tablet Take 1 tablet (50 mg total) by mouth every 6 (six) hours as needed. 20 tablet 0  . VENTOLIN HFA 108 (90 Base) MCG/ACT inhaler Inhale 2 puffs into the lungs daily as needed.     . nitroGLYCERIN (NITROSTAT) 0.4 MG SL tablet Place 1 tablet (0.4 mg total) under the tongue every 5  (five) minutes as needed for chest pain. 25 tablet 3   No current facility-administered medications for this visit.      Allergies:   Morphine and related; Strawberry extract; Tramadol; Venofer [iron sucrose]; Vicodin [hydrocodone-acetaminophen]; Buprenorphine hcl; Codeine; and Morphine   Social History:  The patient  reports that she quit smoking about 17 months ago. She smoked 0.25 packs per day. She has never used smokeless tobacco. She reports that she does not drink alcohol or use drugs.   Family History:   family history includes Heart disease in her mother; Heart failure in her brother; Hypertension in her brother and mother.    Review of Systems: Review of Systems  Constitutional: Negative.   Respiratory:  Positive for shortness of breath.   Cardiovascular: Positive for chest pain and leg swelling.  Gastrointestinal: Negative.   Musculoskeletal: Positive for joint pain.       Arm pain, muscle cramps  Neurological: Negative.   Psychiatric/Behavioral: Negative.   All other systems reviewed and are negative.    PHYSICAL EXAM: VS:  BP (!) 211/112 (BP Location: Left Arm, Patient Position: Sitting, Cuff Size: Normal)   Pulse 74  , BMI There is no height or weight on file to calculate BMI. GEN: Well nourished, well developed, in no acute distress , talkative, obese  HEENT: normal  Neck: no JVD, carotid bruits, or masses Cardiac: RRR; no murmurs, rubs, or gallops, trace nonpitting lower extremity edema  Respiratory:  clear to auscultation bilaterally, normal work of breathing GI: soft, nontender, nondistended, + BS MS: no deformity or atrophy  Skin: warm and dry, no rash Neuro:  Strength and sensation are intact Psych: euthymic mood, full affect    Recent Labs: 07/04/2016: Magnesium 2.0 11/10/2016: ALT 10; TSH 1.838 11/14/2016: B Natriuretic Peptide 220.0; BUN 41; Creatinine, Ser 6.79; Hemoglobin 8.3; Platelets 447; Potassium 3.4; Sodium 140    Lipid Panel Lab Results   Component Value Date   CHOL 161 11/10/2016   HDL 36 (L) 11/10/2016   LDLCALC 92 11/10/2016   TRIG 165 (H) 11/10/2016      Wt Readings from Last 3 Encounters:  12/22/16 182 lb 6.4 oz (82.7 kg)  11/14/16 173 lb (78.5 kg)  11/10/16 173 lb 15.1 oz (78.9 kg)       ASSESSMENT AND PLAN:  Chronic diastolic heart failure (HCC) - Plan: EKG 12-Lead Fluid status managed by hemodialysis. 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 She sees the pain clinic, does not feel she has enough pain medication Recently seen in the emergency room for atypical chest pain Previous cardiac catheterization showing no significant coronary disease  ESRD on dialysis ([facility]) 3 days per week, also makes urine, on Lasix  Chronic pain syndrome Managed with pain medication, she has chronic cramping during dialysis  Anemia in stage 4 chronic kidney disease (HCC)  anemia, likely contributing to shortness of breath, leg swelling, chronic CHF symptoms   Total encounter time more than 25 minutes  Greater than 50% was spent in counseling and coordination of care with the patient   Disposition:   F/U  6 months   Orders Placed This Encounter  Procedures  . EKG 12-Lead     Signed, Esmond Plants, M.D., Ph.D. 01/14/2017  [address], [place]

## 2017-01-14 ENCOUNTER — Ambulatory Visit (INDEPENDENT_AMBULATORY_CARE_PROVIDER_SITE_OTHER): Payer: Medicare Other | Admitting: Cardiovascular Disease

## 2017-01-14 ENCOUNTER — Encounter: Payer: Self-pay | Admitting: Cardiovascular Disease

## 2017-01-14 VITALS — BP 211/112 | HR 74

## 2017-01-14 DIAGNOSIS — F17210 Nicotine dependence, cigarettes, uncomplicated: Secondary | ICD-10-CM

## 2017-01-14 DIAGNOSIS — N184 Chronic kidney disease, stage 4 (severe): Secondary | ICD-10-CM

## 2017-01-14 DIAGNOSIS — I10 Essential (primary) hypertension: Secondary | ICD-10-CM

## 2017-01-14 DIAGNOSIS — J432 Centrilobular emphysema: Secondary | ICD-10-CM

## 2017-01-14 DIAGNOSIS — D631 Anemia in chronic kidney disease: Secondary | ICD-10-CM

## 2017-01-14 DIAGNOSIS — E782 Mixed hyperlipidemia: Secondary | ICD-10-CM | POA: Diagnosis not present

## 2017-01-14 DIAGNOSIS — R0602 Shortness of breath: Secondary | ICD-10-CM | POA: Diagnosis not present

## 2017-01-14 DIAGNOSIS — I5032 Chronic diastolic (congestive) heart failure: Secondary | ICD-10-CM

## 2017-01-14 MED ORDER — NITROGLYCERIN 0.4 MG SL SUBL: 0.4000 mg | SUBLINGUAL_TABLET | SUBLINGUAL | 3 refills | Status: DC | PRN

## 2017-01-14 NOTE — Patient Instructions (Addendum)
Medication Instructions:   No medication changes made  Please take nitro as needed for acute onset of shortness of breath This will help bring blood pressure down  Labwork:  No new labs needed  Testing/Procedures:  No further testing at this time   I recommend watching educational videos on topics of interest to you at:       [email or web address]  Enter code: [email or web address]    Follow-Up: It was a pleasure seeing you in the office today. Please call us if you have new issues that need to be addressed before your next appt.  [phone]  Your physician wants you to follow-up in: 12 months.  You will receive a reminder letter in the mail two months in advance. If you don't receive a letter, please call our office to schedule the follow-up appointment.  If you need a refill on your cardiac medications before your next appointment, please call your pharmacy.

## 2017-01-14 NOTE — Progress Notes (Signed)
Patient given 0.2 mg Clonidine orally with sip of water per Dr. Rockey Situ.

## 2017-01-19 NOTE — Telephone Encounter (Signed)
Acceptable risk for EGD No further workup needed

## 2017-01-20 NOTE — Telephone Encounter (Signed)
Note routed to number provided.

## 2017-02-01 ENCOUNTER — Other Ambulatory Visit: Payer: Self-pay

## 2017-02-01 ENCOUNTER — Emergency Department: Payer: Medicare Other

## 2017-02-01 ENCOUNTER — Inpatient Hospital Stay
Admission: EM | Admit: 2017-02-01 | Discharge: 2017-02-02 | DRG: 291 | Disposition: A | Discharge status: Home / Self Care | Payer: Medicare Other | Attending: Internal Medicine | Admitting: Internal Medicine

## 2017-02-01 DIAGNOSIS — I5033 Acute on chronic diastolic (congestive) heart failure: Secondary | ICD-10-CM | POA: Diagnosis present

## 2017-02-01 DIAGNOSIS — N186 End stage renal disease: Secondary | ICD-10-CM | POA: Diagnosis present

## 2017-02-01 DIAGNOSIS — I248 Other forms of acute ischemic heart disease: Secondary | ICD-10-CM | POA: Diagnosis present

## 2017-02-01 DIAGNOSIS — Z992 Dependence on renal dialysis: Secondary | ICD-10-CM

## 2017-02-01 DIAGNOSIS — Z888 Allergy status to other drugs, medicaments and biological substances status: Secondary | ICD-10-CM | POA: Diagnosis not present

## 2017-02-01 DIAGNOSIS — I132 Hypertensive heart and chronic kidney disease with heart failure and with stage 5 chronic kidney disease, or end stage renal disease: Principal | ICD-10-CM | POA: Diagnosis present

## 2017-02-01 DIAGNOSIS — E1140 Type 2 diabetes mellitus with diabetic neuropathy, unspecified: Secondary | ICD-10-CM | POA: Diagnosis present

## 2017-02-01 DIAGNOSIS — E875 Hyperkalemia: Secondary | ICD-10-CM | POA: Diagnosis present

## 2017-02-01 DIAGNOSIS — Z87891 Personal history of nicotine dependence: Secondary | ICD-10-CM

## 2017-02-01 DIAGNOSIS — J9601 Acute respiratory failure with hypoxia: Secondary | ICD-10-CM | POA: Diagnosis not present

## 2017-02-01 DIAGNOSIS — J449 Chronic obstructive pulmonary disease, unspecified: Secondary | ICD-10-CM | POA: Diagnosis present

## 2017-02-01 DIAGNOSIS — Q613 Polycystic kidney, unspecified: Secondary | ICD-10-CM | POA: Diagnosis not present

## 2017-02-01 DIAGNOSIS — E1122 Type 2 diabetes mellitus with diabetic chronic kidney disease: Secondary | ICD-10-CM | POA: Diagnosis present

## 2017-02-01 DIAGNOSIS — Z79899 Other long term (current) drug therapy: Secondary | ICD-10-CM

## 2017-02-01 DIAGNOSIS — Z7982 Long term (current) use of aspirin: Secondary | ICD-10-CM

## 2017-02-01 DIAGNOSIS — J810 Acute pulmonary edema: Secondary | ICD-10-CM | POA: Diagnosis present

## 2017-02-01 LAB — CBC
HCT: 37.6 % (ref 35.0–47.0)
Hemoglobin: 12.4 g/dL (ref 12.0–16.0)
MCH: 29.7 pg (ref 26.0–34.0)
MCHC: 33 g/dL (ref 32.0–36.0)
MCV: 89.9 fL (ref 80.0–100.0)
PLATELETS: 314 10*3/uL (ref 150–440)
RBC: 4.18 MIL/uL (ref 3.80–5.20)
RDW: 20.8 % — ABNORMAL HIGH (ref 11.5–14.5)
WBC: 11.2 10*3/uL — AB (ref 3.6–11.0)

## 2017-02-01 LAB — COMPREHENSIVE METABOLIC PANEL
ALT: 14 U/L (ref 14–54)
AST: 34 U/L (ref 15–41)
Albumin: 3.9 g/dL (ref 3.5–5.0)
Alkaline Phosphatase: 80 U/L (ref 38–126)
Anion gap: 13 (ref 5–15)
BILIRUBIN TOTAL: 0.9 mg/dL (ref 0.3–1.2)
BUN: 77 mg/dL — ABNORMAL HIGH (ref 6–20)
CHLORIDE: 101 mmol/L (ref 101–111)
CO2: 26 mmol/L (ref 22–32)
CREATININE: 12.06 mg/dL — AB (ref 0.44–1.00)
Calcium: 9.2 mg/dL (ref 8.9–10.3)
GFR, EST AFRICAN AMERICAN: 3 mL/min — AB (ref >60)
GFR, EST NON AFRICAN AMERICAN: 3 mL/min — AB (ref >60)
Glucose, Bld: 85 mg/dL (ref 65–99)
POTASSIUM: 5.6 mmol/L — AB (ref 3.5–5.1)
Sodium: 140 mmol/L (ref 135–145)
Total Protein: 8.6 g/dL — ABNORMAL HIGH (ref 6.5–8.1)

## 2017-02-01 LAB — TROPONIN I
TROPONIN I: 0.04 ng/mL — AB (ref <0.03)
Troponin I: 0.04 ng/mL (ref <0.03)

## 2017-02-01 LAB — BLOOD GAS, VENOUS
Acid-Base Excess: 1.9 mmol/L (ref 0.0–2.0)
BICARBONATE: 29 mmol/L — AB (ref 20.0–28.0)
FIO2: 1
MECHANICAL RATE: 12
MODE: POSITIVE
O2 Saturation: 80.2 %
PEEP/CPAP: 5 cmH2O
PH VEN: 7.33 (ref 7.250–7.430)
PRESSURE SUPPORT: 10 cmH2O
Patient temperature: 37
pCO2, Ven: 55 mmHg (ref 44.0–60.0)
pO2, Ven: 48 mmHg — ABNORMAL HIGH (ref 32.0–45.0)

## 2017-02-01 LAB — BRAIN NATRIURETIC PEPTIDE: B NATRIURETIC PEPTIDE 5: 805 pg/mL — AB (ref 0.0–100.0)

## 2017-02-01 LAB — PHOSPHORUS: PHOSPHORUS: 5.9 mg/dL — AB (ref 2.5–4.6)

## 2017-02-01 LAB — GLUCOSE, CAPILLARY: Glucose-Capillary: 104 mg/dL — ABNORMAL HIGH (ref 65–99)

## 2017-02-01 MED ORDER — ACETAMINOPHEN 325 MG PO TABS
650.0000 mg | ORAL_TABLET | Freq: Four times a day (QID) | ORAL | Status: DC | PRN
  Administered 2017-02-01: 650 mg via ORAL
  Filled 2017-02-01: qty 2

## 2017-02-01 MED ORDER — OXYCODONE-ACETAMINOPHEN 10-325 MG PO TABS: 1.0000 | ORAL_TABLET | Freq: Every day | ORAL | Status: DC | PRN

## 2017-02-01 MED ORDER — HYDRALAZINE HCL 20 MG/ML IJ SOLN
INTRAMUSCULAR | Status: AC
  Filled 2017-02-01: qty 1

## 2017-02-01 MED ORDER — HEPARIN SODIUM (PORCINE) 5000 UNIT/ML IJ SOLN
5000.0000 [IU] | Freq: Three times a day (TID) | INTRAMUSCULAR | Status: DC
  Administered 2017-02-01 – 2017-02-02 (×3): 5000 [IU] via SUBCUTANEOUS
  Filled 2017-02-01 (×4): qty 1

## 2017-02-01 MED ORDER — PANTOPRAZOLE SODIUM 40 MG PO TBEC
40.0000 mg | DELAYED_RELEASE_TABLET | Freq: Every day | ORAL | Status: DC
  Administered 2017-02-01 – 2017-02-02 (×2): 40 mg via ORAL
  Filled 2017-02-01 (×2): qty 1

## 2017-02-01 MED ORDER — ACETAMINOPHEN 325 MG PO TABS: 650.0000 mg | ORAL_TABLET | Freq: Four times a day (QID) | ORAL | Status: DC | PRN

## 2017-02-01 MED ORDER — CLONIDINE HCL 0.1 MG PO TABS
0.1000 mg | ORAL_TABLET | Freq: Two times a day (BID) | ORAL | Status: DC
  Administered 2017-02-01 (×2): 0.1 mg via ORAL
  Filled 2017-02-01 (×2): qty 1

## 2017-02-01 MED ORDER — PROCHLORPERAZINE MALEATE 10 MG PO TABS
10.0000 mg | ORAL_TABLET | Freq: Four times a day (QID) | ORAL | Status: DC | PRN
  Filled 2017-02-01: qty 1

## 2017-02-01 MED ORDER — BUDESONIDE 0.5 MG/2ML IN SUSP
0.5000 mg | Freq: Two times a day (BID) | RESPIRATORY_TRACT | Status: DC
  Administered 2017-02-01 – 2017-02-02 (×2): 0.5 mg via RESPIRATORY_TRACT
  Filled 2017-02-01 (×3): qty 2

## 2017-02-01 MED ORDER — HYDRALAZINE HCL 50 MG PO TABS
50.0000 mg | ORAL_TABLET | Freq: Three times a day (TID) | ORAL | Status: DC
  Administered 2017-02-01 – 2017-02-02 (×4): 50 mg via ORAL
  Filled 2017-02-01 (×4): qty 1

## 2017-02-01 MED ORDER — ACETAMINOPHEN 325 MG PO TABS
ORAL_TABLET | ORAL | Status: AC
  Filled 2017-02-01: qty 2

## 2017-02-01 MED ORDER — LIDOCAINE 5 % EX OINT
TOPICAL_OINTMENT | Freq: Every day | CUTANEOUS | Status: DC | PRN
  Administered 2017-02-02: 09:00:00 via TOPICAL
  Filled 2017-02-01: qty 35.44

## 2017-02-01 MED ORDER — RENA-VITE PO TABS
1.0000 | ORAL_TABLET | Freq: Every day | ORAL | Status: DC
  Administered 2017-02-01: 1 via ORAL
  Filled 2017-02-01 (×2): qty 1

## 2017-02-01 MED ORDER — NITROGLYCERIN 0.4 MG SL SUBL
0.4000 mg | SUBLINGUAL_TABLET | SUBLINGUAL | Status: DC | PRN
  Administered 2017-02-01: 0.4 mg via SUBLINGUAL
  Filled 2017-02-01: qty 1

## 2017-02-01 MED ORDER — OXYCODONE HCL 5 MG PO TABS: 5.0000 mg | ORAL_TABLET | Freq: Every day | ORAL | Status: DC | PRN

## 2017-02-01 MED ORDER — GABAPENTIN 300 MG PO CAPS
300.0000 mg | ORAL_CAPSULE | Freq: Two times a day (BID) | ORAL | Status: DC
  Administered 2017-02-01 – 2017-02-02 (×3): 300 mg via ORAL
  Filled 2017-02-01 (×3): qty 1

## 2017-02-01 MED ORDER — LABETALOL HCL 5 MG/ML IV SOLN
10.0000 mg | INTRAVENOUS | Status: DC | PRN
  Administered 2017-02-01 (×5): 10 mg via INTRAVENOUS
  Filled 2017-02-01 (×4): qty 4

## 2017-02-01 MED ORDER — ONDANSETRON HCL 4 MG PO TABS: 4.0000 mg | ORAL_TABLET | Freq: Three times a day (TID) | ORAL | Status: DC | PRN

## 2017-02-01 MED ORDER — NITROGLYCERIN 2 % TD OINT
1.0000 [in_us] | TOPICAL_OINTMENT | TRANSDERMAL | Status: AC
Start: 2017-02-01 — End: 2017-02-01
  Administered 2017-02-01: 1 [in_us] via TOPICAL
  Filled 2017-02-01: qty 1

## 2017-02-01 MED ORDER — AMITRIPTYLINE HCL 10 MG PO TABS
10.0000 mg | ORAL_TABLET | Freq: Every day | ORAL | Status: DC
  Administered 2017-02-01: 10 mg via ORAL
  Filled 2017-02-01 (×2): qty 1

## 2017-02-01 MED ORDER — ASPIRIN 81 MG PO CHEW
81.0000 mg | CHEWABLE_TABLET | Freq: Every day | ORAL | Status: DC
  Administered 2017-02-01 – 2017-02-02 (×2): 81 mg via ORAL
  Filled 2017-02-01 (×2): qty 1

## 2017-02-01 MED ORDER — ROSUVASTATIN CALCIUM 5 MG PO TABS
5.0000 mg | ORAL_TABLET | Freq: Every day | ORAL | Status: DC
  Administered 2017-02-01: 5 mg via ORAL
  Filled 2017-02-01: qty 1

## 2017-02-01 MED ORDER — METOPROLOL SUCCINATE ER 50 MG PO TB24
50.0000 mg | ORAL_TABLET | Freq: Every day | ORAL | Status: DC
  Administered 2017-02-01 – 2017-02-02 (×2): 50 mg via ORAL
  Filled 2017-02-01 (×2): qty 1

## 2017-02-01 MED ORDER — ACETAMINOPHEN 500 MG PO TABS
1000.0000 mg | ORAL_TABLET | ORAL | Status: AC
  Administered 2017-02-01: 1000 mg via ORAL
  Filled 2017-02-01: qty 2

## 2017-02-01 MED ORDER — NITROGLYCERIN 0.4 MG SL SUBL: 0.4000 mg | SUBLINGUAL_TABLET | SUBLINGUAL | Status: DC | PRN

## 2017-02-01 MED ORDER — PROMETHAZINE HCL 25 MG PO TABS
25.0000 mg | ORAL_TABLET | Freq: Four times a day (QID) | ORAL | Status: DC | PRN
  Filled 2017-02-01: qty 1

## 2017-02-01 MED ORDER — ACETAMINOPHEN 650 MG RE SUPP: 650.0000 mg | Freq: Four times a day (QID) | RECTAL | Status: DC | PRN

## 2017-02-01 MED ORDER — SENNOSIDES-DOCUSATE SODIUM 8.6-50 MG PO TABS: 1.0000 | ORAL_TABLET | Freq: Every evening | ORAL | Status: DC | PRN

## 2017-02-01 MED ORDER — LORAZEPAM 1 MG PO TABS: 1.0000 mg | ORAL_TABLET | Freq: Two times a day (BID) | ORAL | Status: DC | PRN

## 2017-02-01 MED ORDER — ALBUTEROL SULFATE (2.5 MG/3ML) 0.083% IN NEBU
5.0000 mg | INHALATION_SOLUTION | Freq: Once | RESPIRATORY_TRACT | Status: AC
  Administered 2017-02-01: 5 mg via RESPIRATORY_TRACT

## 2017-02-01 MED ORDER — HYDRALAZINE HCL 20 MG/ML IJ SOLN
10.0000 mg | Freq: Once | INTRAMUSCULAR | Status: AC
  Administered 2017-02-01: 10 mg via INTRAVENOUS

## 2017-02-01 MED ORDER — CINACALCET HCL 30 MG PO TABS
30.0000 mg | ORAL_TABLET | Freq: Every day | ORAL | Status: DC
  Administered 2017-02-02: 30 mg via ORAL
  Filled 2017-02-01: qty 1

## 2017-02-01 MED ORDER — OXYCODONE-ACETAMINOPHEN 5-325 MG PO TABS: 1.0000 | ORAL_TABLET | Freq: Every day | ORAL | Status: DC | PRN

## 2017-02-01 MED ORDER — TRAMADOL HCL 50 MG PO TABS: 50.0000 mg | ORAL_TABLET | Freq: Two times a day (BID) | ORAL | Status: DC | PRN

## 2017-02-01 MED ORDER — IPRATROPIUM-ALBUTEROL 0.5-2.5 (3) MG/3ML IN SOLN
3.0000 mL | Freq: Four times a day (QID) | RESPIRATORY_TRACT | Status: DC
  Administered 2017-02-01 – 2017-02-02 (×5): 3 mL via RESPIRATORY_TRACT
  Filled 2017-02-01 (×7): qty 3

## 2017-02-01 MED ORDER — CALCIUM ACETATE (PHOS BINDER) 667 MG PO CAPS
667.0000 mg | ORAL_CAPSULE | Freq: Three times a day (TID) | ORAL | Status: DC
  Administered 2017-02-01 – 2017-02-02 (×3): 667 mg via ORAL
  Filled 2017-02-01 (×3): qty 1

## 2017-02-01 MED ORDER — FUROSEMIDE 10 MG/ML IJ SOLN
80.0000 mg | Freq: Every day | INTRAMUSCULAR | Status: DC
  Administered 2017-02-01: 80 mg via INTRAVENOUS
  Filled 2017-02-01: qty 8

## 2017-02-01 MED ORDER — AMLODIPINE BESYLATE 5 MG PO TABS
5.0000 mg | ORAL_TABLET | Freq: Every day | ORAL | Status: DC
  Administered 2017-02-01 – 2017-02-02 (×2): 5 mg via ORAL
  Filled 2017-02-01 (×2): qty 1

## 2017-02-01 MED ORDER — DIPHENHYDRAMINE HCL 25 MG PO CAPS
50.0000 mg | ORAL_CAPSULE | Freq: Four times a day (QID) | ORAL | Status: DC | PRN
  Filled 2017-02-01: qty 1

## 2017-02-01 NOTE — Progress Notes (Signed)
Per Dr. Mortimer Fries patient can transfer to any med surg with telemetry, and okay to d/c bipap order (patient has been on [place] since 1000 this morning). Orders placed.  [name]

## 2017-02-01 NOTE — H&P (Signed)
Jasmine Holloway at [place] NAME: Jasmine Holloway    MR#:  [id number]  DATE OF BIRTH:  Sep 19, 1960  DATE OF ADMISSION:  02/01/2017  PRIMARY CARE PHYSICIAN: Theotis Burrow, MD   REQUESTING/REFERRING PHYSICIAN: Dr Delman Kitten  CHIEF COMPLAINT:   Chief Complaint  Patient presents with  . Respiratory Distress    HISTORY OF PRESENT ILLNESS:  Jasmine Holloway  is a 56 y.o. female with a known history of Diastolic congestive heart failure presents with respiratory distress. He states that she had dialysis on Saturday for 1 hour and 40 minutes but had some aching cramping pain in her legs and they stop dialysis. She's noticed that her legs have been swollen and it's been hard to breathe. She also complains of some aching in her epigastric area. She also has some pulling in her left upper chest since last night but now this is gone. She was placed on BiPAP 100% FiO2 in the emergency room and is feeling better. Hospitalist services were contacted for admission for acute respiratory distress with hypoxia and acute diastolic congestive heart failure exacerbation. Of note, the patient had a cardiac cath last year which showed normal coronary arteries.  PAST MEDICAL HISTORY:   Past Medical History:  Diagnosis Date  . Asthma   . CHF (congestive heart failure) ([place])   . COPD (chronic obstructive pulmonary disease) ([place])   . Diabetes mellitus without complication ([place])   . Diastolic heart failure ([place])   . ESRD (end stage renal disease) on dialysis ([place])   . Hypertension   . Neuropathy   . Polycystic kidney disease   . Renal insufficiency     PAST SURGICAL HISTORY:   Past Surgical History:  Procedure Laterality Date  . ARTERIOVENOUS GRAFT PLACEMENT Right    x3 (R forearm currently used for access)  . BREAST BIOPSY Left 12/03/2015   neg  . COLON SURGERY    . PERIPHERAL VASCULAR CATHETERIZATION Right 08/04/2016   Procedure: A/V Shuntogram;   Surgeon: Algernon Huxley, MD;  Location: [facility];  Service: Cardiovascular;  Laterality: Right;  . VEIN HARVEST      SOCIAL HISTORY:   Social History  Substance Use Topics  . Smoking status: Former Smoker    Packs/day: 0.25    Quit date: 08/11/2015  . Smokeless tobacco: Never Used  . Alcohol use No    FAMILY HISTORY:   Family History  Problem Relation Age of Onset  . Hypertension Brother   . Heart failure Brother   . Hypertension Mother   . Heart disease Mother     DRUG ALLERGIES:   Allergies  Allergen Reactions  . Morphine And Related Nausea And Vomiting  . Strawberry Extract   . Tramadol Nausea And Vomiting  . Venofer [Iron Sucrose]   . Vicodin [Hydrocodone-Acetaminophen] Nausea And Vomiting  . Buprenorphine Hcl Nausea And Vomiting  . Codeine Nausea And Vomiting  . Morphine Nausea And Vomiting    Halllucinations    REVIEW OF SYSTEMS:  CONSTITUTIONAL: No fever, fatigue or weakness. Positive for weight gain 6 pounds. EYES: No blurred or double vision. Wears reading glasses. EARS, NOSE, AND THROAT: No tinnitus or ear pain. No sore throat. Positive for runny nose RESPIRATORY: Positive for cough and shortness of breath. No wheezing or hemoptysis.  CARDIOVASCULAR: Slight chest pain. Positive for orthopnea, edema.  GASTROINTESTINAL: Positive nausea and abdominal pain. No blood in bowel movements. No vomiting. Some diarrhea. GENITOURINARY: No dysuria, hematuria.  ENDOCRINE: No polyuria, nocturia,  HEMATOLOGY: History of anemia. No easy bruising or bleeding SKIN: No rash or lesion. MUSCULOSKELETAL: No joint pain or arthritis.   NEUROLOGIC: No tingling, numbness, weakness.  PSYCHIATRY: No anxiety or depression.   MEDICATIONS AT HOME:   Prior to Admission medications   Medication Sig Start Date End Date Taking? Authorizing Provider  acetaminophen (TYLENOL) 325 MG tablet Take 2 tablets (650 mg total) by mouth every 6 (six) hours as needed for mild pain or  headache. 03/18/16   Gouru, Illene Silver, MD  amLODipine (NORVASC) 5 MG tablet Take 5 mg by mouth daily. 08/31/14   [provider]  aspirin 81 MG chewable tablet Chew 1 tablet (81 mg total) by mouth daily. 01/17/16   Gladstone Lighter, MD  b complex-vitamin c-folic acid (NEPHRO-VITE) 0.8 MG TABS tablet Take 1 tablet by mouth at bedtime.    [provider]  calcium acetate (PHOSLO) 667 MG capsule Take 3 capsules (2,001 mg total) by mouth 3 (three) times daily with meals. Patient taking differently: Take 667 mg by mouth 3 (three) times daily with meals.  07/07/16   Fritzi Mandes, MD  cloNIDine (CATAPRES) 0.1 MG tablet Take 1 tablet (0.1 mg total) by mouth 2 (two) times daily. 01/17/16   Gladstone Lighter, MD  diphenhydrAMINE (BENADRYL) 25 mg capsule Take 2 capsules (50 mg total) by mouth every 6 (six) hours as needed. 10/16/16   Carrie Mew, MD  furosemide (LASIX) 80 MG tablet Take 1 tablet (80 mg total) by mouth daily. 03/05/16 11/10/17  Alisa Graff, FNP  gabapentin (NEURONTIN) 300 MG capsule Take 300 mg by mouth 2 (two) times daily.     [provider]  lidocaine (XYLOCAINE) 5 % ointment Apply topically.    [provider]  LORazepam (ATIVAN) 1 MG tablet Take 1 tablet (1 mg total) by mouth 2 (two) times daily as needed for anxiety. 07/21/16   Daymon Larsen, MD  metolazone (ZAROXOLYN) 5 MG tablet Take 1 tablet (5 mg total) by mouth daily as needed. 06/08/16 11/10/17  Minna Merritts, MD  nitroGLYCERIN (NITROSTAT) 0.4 MG SL tablet Place 1 tablet (0.4 mg total) under the tongue every 5 (five) minutes as needed for chest pain. 01/14/17 04/14/17  Minna Merritts, MD  omeprazole (PRILOSEC) 20 MG capsule  12/20/16   [provider]  ondansetron (ZOFRAN) 4 MG tablet Take 1 tablet (4 mg total) by mouth every 8 (eight) hours as needed for nausea or vomiting. 07/21/16   Daymon Larsen, MD  oxyCODONE-acetaminophen (PERCOCET) 10-325 MG tablet Take 1 tablet by mouth daily as  needed for pain.    [provider]  prochlorperazine (COMPAZINE) 10 MG tablet Take 1 tablet (10 mg total) by mouth every 6 (six) hours as needed for nausea. 10/15/16   Paulette Blanch, MD  promethazine (PHENERGAN) 25 MG tablet Take 1 tablet (25 mg total) by mouth every 6 (six) hours as needed for nausea or vomiting. 10/16/16   Carrie Mew, MD  rosuvastatin (CRESTOR) 5 MG tablet Take 1 tablet (5 mg total) by mouth at bedtime. 07/07/16   Fritzi Mandes, MD  SENSIPAR 30 MG tablet Take 1 tablet (30 mg total) by mouth daily. 07/07/16   Fritzi Mandes, MD  traMADol (ULTRAM) 50 MG tablet Take 1 tablet (50 mg total) by mouth every 6 (six) hours as needed. 07/21/16   Daymon Larsen, MD  VENTOLIN HFA 108 [phone] Base) MCG/ACT inhaler Inhale 2 puffs into the lungs daily as  needed.  05/21/16   [provider]      VITAL SIGNS:  Blood pressure (!) 192/104, pulse 76, temperature 97.5 F (36.4 C), temperature source Oral, resp. rate (!) 23, weight 81.6 kg (180 lb), SpO2 100 %.  PHYSICAL EXAMINATION:  GENERAL:  56 y.o.-year-old patient Sitting in bed on BiPAP.  EYES: Pupils equal, round, reactive to light and accommodation. No scleral icterus. Extraocular muscles intact.  HEENT: Head atraumatic, normocephalic. Oropharynx and nasopharynx clear.  NECK:  Supple, no jugular venous distention. No thyroid enlargement, no tenderness.  LUNGS: Decreased breath sounds bilaterally, no wheezing, positive rales at bases, no rhonchi or crepitation. Positive use of accessory muscles of respiration.  CARDIOVASCULAR: S1, S2 normal. No murmurs, rubs, or gallops.  ABDOMEN: Soft, nontender, nondistended. Bowel sounds present. No organomegaly or mass.  EXTREMITIES: 2+ edema, no cyanosis, or clubbing.  NEUROLOGIC: Cranial nerves II through XII are intact. Muscle strength 5/5 in all extremities. Sensation intact. Gait not checked.  PSYCHIATRIC: The patient is alert and oriented x 3.  SKIN: No rash, lesion, or ulcer.    LABORATORY PANEL:   CBC  Recent Labs Lab 02/01/17 0619  WBC 11.2*  HGB 12.4  HCT 37.6  PLT 314   ------------------------------------------------------------------------------------------------------------------  Chemistries   Recent Labs Lab 02/01/17 0619  NA 140  K 5.6*  CL 101  CO2 26  GLUCOSE 85  BUN 77*  CREATININE 12.06*  CALCIUM 9.2  AST 34  ALT 14  ALKPHOS 80  BILITOT 0.9   ------------------------------------------------------------------------------------------------------------------  Cardiac Enzymes  Recent Labs Lab 02/01/17 0619  TROPONINI 0.04*   ------------------------------------------------------------------------------------------------------------------  RADIOLOGY:  Dg Chest Portable 1 View  Result Date: 02/01/2017 CLINICAL DATA:  Acute onset dyspnea tonight EXAM: PORTABLE CHEST 1 VIEW COMPARISON:  11/14/2016 FINDINGS: Unchanged moderate cardiomegaly. Patchy airspace opacity in the central and basilar regions, worsened from 11/14/2016. This could represent alveolar edema associated with CHF, but infectious infiltrates are not excluded. Probable small left effusion. IMPRESSION: New/ worsened airspace opacities may represent alveolar edema. Cannot exclude infectious infiltrate. Electronically Signed   By: Andreas Newport M.D.   On: 02/01/2017 06:46    EKG:   Normal sinus rhythm 70 bpm left atrial enlargement  IMPRESSION AND PLAN:   1. Acute respiratory distress with acute respiratory failure with hypoxia. Patient placed on BiPAP 100% FiO2. This likely can be tapered down. Admit to the CCU stepdown. Case discussed with critical care specialist Dr. Mortimer Fries. 2. Acute diastolic congestive heart failure. We'll give 1 dose of IV Lasix. ER physician spoke with nephrologist to do urgent dialysis. Last echocardiogram showed a normal ejection fraction. 3. Accelerated hypertension. Restart all of her antihypertensive medications and adjust based on  her follow-up blood pressures. 4. Borderline troponin secondary to demand ischemia from acute diastolic congestive heart failure. The patient had normal coronary arteries on cardiac catheterization last year. 5. Hyperkalemia. Dialysis should manage this. 6. Type 2 diabetes mellitus without complication. Diet control only 7. History of COPD. Start nebulizer treatments 8. History of neuropathy on gabapentin   All the records are reviewed and case discussed with ED provider. Management plans discussed with the patient, and she is in agreement. Case discussed with critical care specialist  CODE STATUS: Full code  TOTAL TIME TAKING CARE OF THIS PATIENT: 55 minutes, patient will be admitted to the critical care unit for urgent dialysis to get respiratory status better.   Loletha Grayer M.D on 02/01/2017 at 7:49 AM  Between 7am to 6pm - Pager - [phone]  After 6pm call admission pager [place]  [phone]  CC: Primary care physician; Theotis Burrow, MD

## 2017-02-01 NOTE — Progress Notes (Signed)
Pre hd info 

## 2017-02-01 NOTE — ED Notes (Signed)
This RN to bedside at this time. Purple top recollected. This RN introduced self to patient and apologized for delay in this RN coming to room. Pt states understanding. Pt remains on bipap and remains hypertensive.

## 2017-02-01 NOTE — Progress Notes (Signed)
Acceptable risk, no further testing needed thx TGollan

## 2017-02-01 NOTE — ED Provider Notes (Signed)
[facility] Emergency Department Provider Note  ____________________________________________   First MD Initiated Contact with Patient 02/01/17 [phone]     (approximate)  I have reviewed the triage vital signs and the nursing notes.   HISTORY  Chief Complaint Respiratory Distress EM caveat: The patient is clearly dyspneic, unable to sleep for one to 2 words at a time  HPI Jasmine Holloway is a 56 y.o. female here for evaluation of shortness of breath   Past Medical History:  Diagnosis Date  . Asthma   . CHF (congestive heart failure) ([place])   . COPD (chronic obstructive pulmonary disease) ([place])   . Diabetes mellitus without complication ([place])   . Diastolic heart failure ([place])   . ESRD (end stage renal disease) on dialysis ([place])   . Hypertension   . Neuropathy   . Polycystic kidney disease   . Renal insufficiency     Patient Active Problem List   Diagnosis Date Noted  . Acute respiratory failure with hypoxia ([place]) 11/10/2016  . Hypoxia 11/10/2016  . Shortness of breath 10/26/2016  . COPD (chronic obstructive pulmonary disease) ([place]) 08/06/2016  . Hypertensive crisis 08/06/2016  . Acute pulmonary edema ([place]) 07/04/2016  . Acute respiratory failure ([place]) 06/09/2016  . Atypical chest pain 06/08/2016  . Chronic pain syndrome 06/08/2016  . Anemia in stage 4 chronic kidney disease ([place]) 06/08/2016  . CHF exacerbation ([place]) 04/28/2016  . Epigastric pain 03/28/2016  . Acute CHF (congestive heart failure) ([place]) 03/28/2016  . CHF (congestive heart failure) ([place]) 03/28/2016  . Chronic diastolic heart failure ([place]) 02/04/2016  . Hoarseness of voice 02/04/2016  . Nicotine dependence, uncomplicated 83/38/2505  . Acute on chronic diastolic CHF (congestive heart failure) ([place]) 12/22/2015  . ESRF (end stage renal failure) ([place]) 12/22/2015  . Essential hypertension 12/22/2015  . Mixed hyperlipidemia 12/22/2015  . Knee pain, left anterior 06/11/2015  . Arm  pain, left 05/31/2015  . Pain of left arm 05/31/2015  . Closed bilateral ankle fractures, with routine healing, subsequent encounter 07/24/2014  . Closed bilateral ankle fractures, initial encounter 06/20/2014    Past Surgical History:  Procedure Laterality Date  . ARTERIOVENOUS GRAFT PLACEMENT Right    x3 (R forearm currently used for access)  . BREAST BIOPSY Left 12/03/2015   neg  . COLON SURGERY    . PERIPHERAL VASCULAR CATHETERIZATION Right 08/04/2016   Procedure: A/V Shuntogram;  Surgeon: Algernon Huxley, MD;  Location: [facility];  Service: Cardiovascular;  Laterality: Right;  . VEIN HARVEST      Prior to Admission medications   Medication Sig Start Date End Date Taking? Authorizing Provider  amLODipine (NORVASC) 5 MG tablet Take 5 mg by mouth daily. 08/31/14  Yes [provider]  aspirin 81 MG chewable tablet Chew 1 tablet (81 mg total) by mouth daily. 01/17/16  Yes Gladstone Lighter, MD  b complex-vitamin c-folic acid (NEPHRO-VITE) 0.8 MG TABS tablet Take 1 tablet by mouth at bedtime.   Yes [provider]  calcium acetate (PHOSLO) 667 MG capsule Take 3 capsules (2,001 mg total) by mouth 3 (three) times daily with meals. Patient taking differently: Take 667 mg by mouth 3 (three) times daily with meals.  07/07/16  Yes Fritzi Mandes, MD  cloNIDine (CATAPRES) 0.1 MG tablet Take 1 tablet (0.1 mg total) by mouth 2 (two) times daily. 01/17/16  Yes Gladstone Lighter, MD  furosemide (LASIX) 80 MG tablet Take 1 tablet (80 mg total) by mouth daily. 03/05/16 11/10/17 Yes [name]  A, FNP  gabapentin (NEURONTIN) 300 MG capsule Take 300 mg by mouth 2 (two) times daily.    Yes [provider]  losartan (COZAAR) 25 MG tablet Take 25 mg by mouth daily.   Yes [provider]  metoprolol tartrate (LOPRESSOR) 50 MG tablet Take 50 mg by mouth 2 (two) times daily.   Yes [provider]  omeprazole (PRILOSEC) 40 MG capsule Take by mouth daily.  12/20/16   Yes [provider]  VENTOLIN HFA 108 (90 Base) MCG/ACT inhaler Inhale 2 puffs into the lungs daily as needed.  05/21/16  Yes [provider]  acetaminophen (TYLENOL) 325 MG tablet Take 2 tablets (650 mg total) by mouth every 6 (six) hours as needed for mild pain or headache. 03/18/16   Gouru, Illene Silver, MD  diphenhydrAMINE (BENADRYL) 25 mg capsule Take 2 capsules (50 mg total) by mouth every 6 (six) hours as needed. 10/16/16   Carrie Mew, MD  lidocaine (XYLOCAINE) 5 % ointment Apply topically.    [provider]  LORazepam (ATIVAN) 1 MG tablet Take 1 tablet (1 mg total) by mouth 2 (two) times daily as needed for anxiety. 07/21/16   Daymon Larsen, MD  metolazone (ZAROXOLYN) 5 MG tablet Take 1 tablet (5 mg total) by mouth daily as needed. 06/08/16 11/10/17  Minna Merritts, MD  nitroGLYCERIN (NITROSTAT) 0.4 MG SL tablet Place 1 tablet (0.4 mg total) under the tongue every 5 (five) minutes as needed for chest pain. 01/14/17 04/14/17  Minna Merritts, MD  ondansetron (ZOFRAN) 4 MG tablet Take 1 tablet (4 mg total) by mouth every 8 (eight) hours as needed for nausea or vomiting. 07/21/16   Daymon Larsen, MD  oxyCODONE-acetaminophen (PERCOCET) 10-325 MG tablet Take 1 tablet by mouth daily as needed for pain.    [provider]  prochlorperazine (COMPAZINE) 10 MG tablet Take 1 tablet (10 mg total) by mouth every 6 (six) hours as needed for nausea. 10/15/16   Paulette Blanch, MD  rosuvastatin (CRESTOR) 5 MG tablet Take 1 tablet (5 mg total) by mouth at bedtime. Patient not taking: Reported on 02/01/2017 07/07/16   Fritzi Mandes, MD  SENSIPAR 30 MG tablet Take 1 tablet (30 mg total) by mouth daily. Patient not taking: Reported on 02/01/2017 07/07/16   Fritzi Mandes, MD  traMADol (ULTRAM) 50 MG tablet Take 1 tablet (50 mg total) by mouth every 6 (six) hours as needed. Patient not taking: Reported on 02/01/2017 07/21/16   Daymon Larsen, MD    Allergies Morphine and related;  Strawberry extract; Tramadol; Venofer [iron sucrose]; Vicodin [hydrocodone-acetaminophen]; Buprenorphine hcl; Codeine; and Morphine  Family History  Problem Relation Age of Onset  . Hypertension Brother   . Heart failure Brother   . Hypertension Mother   . Heart disease Mother     Social History Social History  Substance Use Topics  . Smoking status: Former Smoker    Packs/day: 0.25    Quit date: 08/11/2015  . Smokeless tobacco: Never Used  . Alcohol use No    Review of Systems  EM caveat  ____________________________________________   PHYSICAL EXAM:  VITAL SIGNS: ED Triage Vitals  Enc Vitals Group     BP 02/01/17 0623 (!) 193/111     Pulse Rate 02/01/17 0616 78     Resp 02/01/17 0616 (!) 26     Temp 02/01/17 0616 97.5 F (36.4 C)     Temp Source 02/01/17 0616 Oral     SpO2 02/01/17  6720 (!) 88 %     Weight 02/01/17 0616 180 lb (81.6 kg)     Height --      Head Circumference --      Peak Flow --      Pain Score 02/01/17 0620 6     Pain Loc --      Pain Edu? --      Excl. in [place]? --     Constitutional: Alert and Oriented but very dyspneic. Eyes: Conjunctivae are normal. Head: Atraumatic. Nose: No congestion/rhinnorhea. Mouth/Throat: Mucous membranes are slightly dry. Neck: No stridor.   Cardiovascular: Normal rate, regular rhythm. Grossly normal heart sounds.  Good peripheral circulation. Respiratory: Tachypnea, moderate to severe 80 6S her muscles, rales versus rhonchi heard throughout Gastrointestinal: Soft and nontender. No distention. Musculoskeletal: No lower extremity tenderness nor. Neurologic:  Normal speech and language. No gross focal neurologic deficits are appreciated.  Skin:  Skin is warm, dry and intact. No rash noted. Psychiatric: Mood and affect are anxious. Speech labor due to dyspnea  ____________________________________________   LABS (all labs ordered are listed, but only abnormal results are displayed)  Labs Reviewed  BLOOD GAS,  VENOUS - Abnormal; Notable for the following:       Result Value   pO2, Ven 48.0 (*)    Bicarbonate 29.0 (*)    All other components within normal limits  TROPONIN I - Abnormal; Notable for the following:    Troponin I 0.04 (*)    All other components within normal limits  COMPREHENSIVE METABOLIC PANEL - Abnormal; Notable for the following:    Potassium 5.6 (*)    BUN 77 (*)    Creatinine, Ser 12.06 (*)    Total Protein 8.6 (*)    GFR calc non Af Amer 3 (*)    GFR calc Af Amer 3 (*)    All other components within normal limits  CBC - Abnormal; Notable for the following:    WBC 11.2 (*)    RDW 20.8 (*)    All other components within normal limits  BRAIN NATRIURETIC PEPTIDE  CBC  CREATININE, SERUM   ____________________________________________  EKG  Reviewed and interpreted me at 6:25 AM Heart rate 70 Through is 99 QTc 470 Sinus rhythm, probable left ventricular hypertrophy, no obvious evidence of ischemia, T-wave changes most likely secondary to LVH. NO STEMI ____________________________________________  RADIOLOGY  Dg Chest Portable 1 View  Result Date: 02/01/2017 CLINICAL DATA:  Acute onset dyspnea tonight EXAM: PORTABLE CHEST 1 VIEW COMPARISON:  11/14/2016 FINDINGS: Unchanged moderate cardiomegaly. Patchy airspace opacity in the central and basilar regions, worsened from 11/14/2016. This could represent alveolar edema associated with CHF, but infectious infiltrates are not excluded. Probable small left effusion. IMPRESSION: New/ worsened airspace opacities may represent alveolar edema. Cannot exclude infectious infiltrate. Electronically Signed   By: Andreas Newport M.D.   On: 02/01/2017 06:46    ____________________________________________   PROCEDURES  Procedure(s) performed: None  Procedures  Critical Care performed: Yes, see critical care note(s)  CRITICAL CARE Performed by: Delman Kitten   Total critical care time: 50 minutes  Critical care time was  exclusive of separately billable procedures and treating other patients.  Critical care was necessary to treat or prevent imminent or life-threatening deterioration.  Critical care was time spent personally by me on the following activities: development of treatment plan with patient and/or surrogate as well as nursing, discussions with consultants, evaluation of patient's response to treatment, examination of patient, obtaining history from patient or  surrogate, ordering and performing treatments and interventions, ordering and review of laboratory studies, ordering and review of radiographic studies, pulse oximetry and re-evaluation of patient's condition.  ____________________________________________   INITIAL IMPRESSION / ASSESSMENT AND PLAN / ED COURSE  Pertinent labs & imaging results that were available during my care of the patient were reviewed by me and considered in my medical decision making (see chart for details).  Patient placed on BiPAP with excellent effect. Breathing much improved thereafter. She does have rales versus wheezing heard throughout. She is showing improvement with no hypoxia, and much better work of breathing.  Chest x-ray appears most concerning for pulmonary edema, likely flash. She is a dialysis patient and making phone calls to arrange emergency dialysis.  Clinical Course as of Feb 01 757  Mon Feb 01, 2017  2353 Patient reports her breathing is improving slowly. Significant hypertension, looking at chest x-ray and her presentation I suspect that pulmonary edema may be the primary driver. We'll attempt nitrates to lower pressure and improved work of breathing, patient is reporting some improvement at this time. Second page out to nephrology at this time (Dr. Candiss Norse)  [MQ]  0700 Have spoken with Dr. Candiss Norse, he is arranging emergent dialysis. I have spoken with Dr. Jerre Simon, working to get patient moved to stepdown unit for dialysis. She reports improvement over  her breathing, and appears to be breathing more comfortably at this time. She does report a headache which she gets after nitroglycerin, nurse requesting Tylenol.  [MQ]  [phone] Patient alert, tolerating BiPAP well. Understanding of plan for admission and dialysis.  [MQ]    Clinical Course User Index [MQ] Delman Kitten, MD   ----------------------------------------- 7:53 AM on 02/01/2017 -----------------------------------------  Ongoing care assigned to Dr. Mable Paris. Patient currently been seen by the hospitalist, awaiting transfer to stepdown unit to initiate hemodialysis.  ____________________________________________   FINAL CLINICAL IMPRESSION(S) / ED DIAGNOSES  Final diagnoses:  Pulmonary edema, acute ([place])      NEW MEDICATIONS STARTED DURING THIS VISIT:  New Prescriptions   No medications on file     Note:  This document was prepared using Dragon voice recognition software and may include unintentional dictation errors.     Delman Kitten, MD 02/01/17 [phone]

## 2017-02-01 NOTE — Progress Notes (Signed)
Post hd vitals 

## 2017-02-01 NOTE — Progress Notes (Signed)
Per Dr. Holley Raring place order for 10 mg hydralazine x1 IV for hypertension. [name]

## 2017-02-01 NOTE — Progress Notes (Signed)
Hd start 

## 2017-02-01 NOTE — Progress Notes (Signed)
  End of hd 

## 2017-02-01 NOTE — Progress Notes (Signed)
Patient BP continues to be elevated, Dr. Mortimer Fries notified and gave verbal order for PRN labatalol q1h 10 mg. Order placed. [name]

## 2017-02-01 NOTE — ED Notes (Signed)
Pt visualized in room, remains on bipap, remains hypertensive. Will continue to monitor for further patient needs.

## 2017-02-01 NOTE — Consult Note (Addendum)
Name: Jasmine Holloway MRN: [id number] DOB: 11/10/1960     CONSULTATION DATE: 02/01/2017  REFERRING MD : Bobbye Charleston  CHIEF COMPLAINT:  Shortness of breath     HISTORY OF PRESENT ILLNESS:   56 y.o. female with a known history of Diastolic congestive heart failure presents with respiratory distress. states that she had dialysis on Saturday for 1 hour and 40 minutes but had some aching cramping pain in her legs and they stop dialysis.  -She's noticed that her legs have been swollen and it's been hard to breathe.  -She also complains of some aching in her epigastric area. She also has some pulling in her left upper chest since last night but now this is gone.   She was placed on BiPAP 100% FiO2 in the emergency room and is feeling better.  the patient had a cardiac cath last year which showed normal coronary arteries.  Patient was admitted to the stepdown unit for further observation Patient is currently on BiPAP and tolerating well Will attempt to transition off BiPAP and assess respiratory status Continue HD as needed PAST MEDICAL HISTORY :   has a past medical history of Asthma; CHF (congestive heart failure) ([place]); COPD (chronic obstructive pulmonary disease) ([place]); Diabetes mellitus without complication ([place]); Diastolic heart failure ([place]); ESRD (end stage renal disease) on dialysis ([place]); Hypertension; Neuropathy; Polycystic kidney disease; and Renal insufficiency.  has a past surgical history that includes Arteriovenous graft placement (Right); Vein harvest; Colon surgery; Cardiac catheterization (Right, 08/04/2016); and Breast biopsy (Left, 12/03/2015). Prior to Admission medications   Medication Sig Start Date End Date Taking? Authorizing Provider  amLODipine (NORVASC) 5 MG tablet Take 5 mg by mouth daily. 08/31/14  Yes [provider]  aspirin 81 MG chewable tablet Chew 1 tablet (81 mg total) by mouth daily. 01/17/16  Yes Gladstone Lighter, MD  b complex-vitamin c-folic acid  (NEPHRO-VITE) 0.8 MG TABS tablet Take 1 tablet by mouth at bedtime.   Yes [provider]  calcium acetate (PHOSLO) 667 MG capsule Take 3 capsules (2,001 mg total) by mouth 3 (three) times daily with meals. Patient taking differently: Take 667 mg by mouth 3 (three) times daily with meals.  07/07/16  Yes Fritzi Mandes, MD  cloNIDine (CATAPRES) 0.1 MG tablet Take 1 tablet (0.1 mg total) by mouth 2 (two) times daily. 01/17/16  Yes Gladstone Lighter, MD  furosemide (LASIX) 80 MG tablet Take 1 tablet (80 mg total) by mouth daily. 03/05/16 11/10/17 Yes Hackney, Otila Kluver A, FNP  gabapentin (NEURONTIN) 300 MG capsule Take 300 mg by mouth 2 (two) times daily.    Yes [provider]  losartan (COZAAR) 25 MG tablet Take 25 mg by mouth daily.   Yes [provider]  metoprolol tartrate (LOPRESSOR) 50 MG tablet Take 50 mg by mouth 2 (two) times daily.   Yes [provider]  omeprazole (PRILOSEC) 40 MG capsule Take by mouth daily.  12/20/16  Yes [provider]  VENTOLIN HFA 108 (90 Base) MCG/ACT inhaler Inhale 2 puffs into the lungs daily as needed.  05/21/16  Yes [provider]  acetaminophen (TYLENOL) 325 MG tablet Take 2 tablets (650 mg total) by mouth every 6 (six) hours as needed for mild pain or headache. 03/18/16   Gouru, Illene Silver, MD  diphenhydrAMINE (BENADRYL) 25 mg capsule Take 2 capsules (50 mg total) by mouth every 6 (six) hours as needed. 10/16/16   Carrie Mew, MD  lidocaine (XYLOCAINE) 5 % ointment Apply topically.    [provider]  LORazepam (ATIVAN) 1 MG tablet Take 1 tablet (1 mg total) by mouth 2 (two) times daily as needed for anxiety. 07/21/16   Daymon Larsen, MD  metolazone (ZAROXOLYN) 5 MG tablet Take 1 tablet (5 mg total) by mouth daily as needed. 06/08/16 11/10/17  Minna Merritts, MD  nitroGLYCERIN (NITROSTAT) 0.4 MG SL tablet Place 1 tablet (0.4 mg total) under the tongue every 5 (five) minutes as needed for chest pain. 01/14/17  04/14/17  Minna Merritts, MD  ondansetron (ZOFRAN) 4 MG tablet Take 1 tablet (4 mg total) by mouth every 8 (eight) hours as needed for nausea or vomiting. 07/21/16   Daymon Larsen, MD  oxyCODONE-acetaminophen (PERCOCET) 10-325 MG tablet Take 1 tablet by mouth daily as needed for pain.    [provider]  prochlorperazine (COMPAZINE) 10 MG tablet Take 1 tablet (10 mg total) by mouth every 6 (six) hours as needed for nausea. 10/15/16   Paulette Blanch, MD  rosuvastatin (CRESTOR) 5 MG tablet Take 1 tablet (5 mg total) by mouth at bedtime. Patient not taking: Reported on 02/01/2017 07/07/16   Fritzi Mandes, MD  SENSIPAR 30 MG tablet Take 1 tablet (30 mg total) by mouth daily. Patient not taking: Reported on 02/01/2017 07/07/16   Fritzi Mandes, MD  traMADol (ULTRAM) 50 MG tablet Take 1 tablet (50 mg total) by mouth every 6 (six) hours as needed. Patient not taking: Reported on 02/01/2017 07/21/16   Daymon Larsen, MD   Allergies  Allergen Reactions  . Morphine And Related Nausea And Vomiting  . Strawberry Extract   . Tramadol Nausea And Vomiting  . Venofer [Iron Sucrose]   . Vicodin [Hydrocodone-Acetaminophen] Nausea And Vomiting  . Buprenorphine Hcl Nausea And Vomiting  . Codeine Nausea And Vomiting  . Morphine Nausea And Vomiting    Halllucinations    FAMILY HISTORY:  family history includes Heart disease in her mother; Heart failure in her brother; Hypertension in her brother and mother. SOCIAL HISTORY:  reports that she quit smoking about 17 months ago. She smoked 0.25 packs per day. She has never used smokeless tobacco. She reports that she does not drink alcohol or use drugs.  REVIEW OF SYSTEMS:   Constitutional: Negative for fever, chills, weight loss, malaise/fatigue and diaphoresis.  HENT: Negative for hearing loss, ear pain, nosebleeds, congestion, sore throat, neck pain, tinnitus and ear discharge.   Eyes: Negative for blurred vision, double vision, photophobia, pain,  discharge and redness.  Respiratory: Negative for cough, -hemoptysis, sputum production, + shortness of breath, -wheezing and stridor.   Cardiovascular: Negative for chest pain, palpitations, orthopnea, claudication, leg swelling and PND.  Gastrointestinal: Negative for heartburn, nausea, vomiting, abdominal pain, diarrhea, constipation, blood in stool and melena.  Genitourinary: Negative for dysuria, urgency, frequency, hematuria and flank pain.  Musculoskeletal: Negative for myalgias, back pain, joint pain and falls.  Skin: Negative for itching and rash.  Neurological: Negative for dizziness, tingling, tremors, sensory change, speech change, focal weakness, seizures, loss of consciousness, weakness and headaches.  Endo/Heme/Allergies: Negative for environmental allergies and polydipsia. Does not bruise/bleed easily.  ALL OTHER ROS ARE NEGATIVE    VITAL SIGNS: Temp:  [97.5 F (36.4 C)] 97.5 F (36.4 C) (06/25 0910) Pulse Rate:  [65-80] 80 (06/25 1030) Resp:  [12-26] 19 (06/25 1030) BP: (166-229)/(95-111) 209/98 (06/25 1030) SpO2:  [88 %-100 %] 100 % (06/25 1030) Weight:  [179 lb 14.3 oz (81.6 kg)-180 lb (81.6 kg)] 179 lb 14.3 oz (81.6 kg) (06/25  0910)  Physical Examination:   GENERAL:NAD, no fevers, chills, no weakness no fatigue, on BiPAP HEAD: Normocephalic, atraumatic.  EYES: Pupils equal, round, reactive to light. Extraocular muscles intact. No scleral icterus.  MOUTH: Moist mucosal membrane.   EAR, NOSE, THROAT: Clear without exudates. No external lesions.  NECK: Supple. No thyromegaly. No nodules. No JVD.  PULMONARY:CTA B/L no wheezes, + crackles CARDIOVASCULAR: S1 and S2. Regular rate and rhythm. No murmurs, rubs, or gallops. No edema.  GASTROINTESTINAL: Soft, nontender, nondistended. No masses. Positive bowel sounds.  MUSCULOSKELETAL: + Edema Range of motion full in all extremities.  NEUROLOGIC: Cranial nerves II through XII are intact. No gross focal neurological  deficits.  SKIN: No ulceration, lesions, rashes, or cyanosis. Skin warm and dry. Turgor intact.  PSYCHIATRIC: Mood, affect within normal limits. The patient is awake, alert and oriented x 3. Insight, judgment intact.       Recent Labs Lab 02/01/17 0619  NA 140  K 5.6*  CL 101  CO2 26  BUN 77*  CREATININE 12.06*  GLUCOSE 85    Recent Labs Lab 02/01/17 0619  HGB 12.4  HCT 37.6  WBC 11.2*  PLT 314   Dg Chest Portable 1 View  Result Date: 02/01/2017 CLINICAL DATA:  Acute onset dyspnea tonight EXAM: PORTABLE CHEST 1 VIEW COMPARISON:  11/14/2016 FINDINGS: Unchanged moderate cardiomegaly. Patchy airspace opacity in the central and basilar regions, worsened from 11/14/2016. This could represent alveolar edema associated with CHF, but infectious infiltrates are not excluded. Probable small left effusion. IMPRESSION: New/ worsened airspace opacities may represent alveolar edema. Cannot exclude infectious infiltrate. Electronically Signed   By: Andreas Newport M.D.   On: 02/01/2017 06:46    I have Independently reviewed images of  CXR   on 02/01/2017 Interpretation:b/l opacities    ASSESSMENT / PLAN:56 year old African-American female admitted to the ICU for acute respiratory distress secondary to fluid overload from acute diastolic heart failure in the setting of end-stage renal disease  #1 BiPAP as needed wean as tolerated #2 Oxygen as needed #3 hemodialysis as per nephrology #4 continue stepdown status  After further assessment I believe patient will be able to transition off her BiPAP will likely move to general medical floor in the next 12-24 hours   Patient satisfied with Plan of action and management. All questions answered  Corrin Parker, M.D.  [facility]  Medical Director [place] Director [facility] Cardio-Pulmonary Department

## 2017-02-01 NOTE — ED Notes (Signed)
This RN attempted x 2 for IV access without success. Velna Hatchet, RN receiving in ICU notified, states OK.

## 2017-02-01 NOTE — ED Notes (Signed)
Date and time results received: 02/01/17 7:09 AM   Test: Trop  Critical Value: 0.04  Name of Provider Notified: Dr. Jacqualine Code  Orders Received? Or Actions Taken?: Critical Value Acknowledged

## 2017-02-01 NOTE — Plan of Care (Signed)
Problem: Fluid Volume: Goal: Ability to maintain a balanced intake and output will improve Outcome: Progressing Fluid restrictions reviewed with patient.  Understanding voiced

## 2017-02-01 NOTE — ED Triage Notes (Signed)
Patient EMS from home. Pt called out for respiratory distress. Pt given two duonebs and 125 mg solumedrol by EMS. Pt has hx of previous intubation for respiratory distress. Pt dialysis patient - last dialysis on Saturday, scheduled for extra dialysis session today. Pt type 1 diabetic

## 2017-02-01 NOTE — Progress Notes (Signed)
Post hd assessment 

## 2017-02-01 NOTE — Progress Notes (Signed)
[facility]  ROUNDING NOTE   Subjective:  Patient very well known to [name]. We follow her for outpatient hemodialysis. Recently patient has struggled with high intradialytic weight gain. She went for extra treatment this past week. She was unable to get down to her dry weight on Saturday.   Objective:  Vital signs in last 24 hours:  Temp:  [97.5 F (36.4 C)] 97.5 F (36.4 C) (06/25 0910) Pulse Rate:  [65-79] 74 (06/25 0930) Resp:  [12-26] 22 (06/25 0930) BP: (166-229)/(95-111) 196/107 (06/25 0930) SpO2:  [88 %-100 %] 100 % (06/25 0930) Weight:  [81.6 kg (179 lb 14.3 oz)-81.6 kg (180 lb)] 81.6 kg (179 lb 14.3 oz) (06/25 0910)  Weight change:  Filed Weights   02/01/17 0616 02/01/17 0910  Weight: 81.6 kg (180 lb) 81.6 kg (179 lb 14.3 oz)    Intake/Output: No intake/output data recorded.   Intake/Output this shift:  No intake/output data recorded.  Physical Exam: General: Critically ill appearing   Head: Normocephalic, atraumatic. Moist oral mucosal membranes  Eyes: Anicteric  Neck: Supple, trachea midline  Lungs:  Bilateral rales, on BiPAP   Heart: S1S2 no rubs  Abdomen:  Soft, nontender, bowel sounds present  Extremities: 2+ peripheral edema.  Neurologic: Awake, alert, following commands  Skin: No lesions  Access: RUE AVG    Basic Metabolic Panel:  Recent Labs Lab 02/01/17 0619  NA 140  K 5.6*  CL 101  CO2 26  GLUCOSE 85  BUN 77*  CREATININE 12.06*  CALCIUM 9.2    Liver Function Tests:  Recent Labs Lab 02/01/17 0619  AST 34  ALT 14  ALKPHOS 80  BILITOT 0.9  PROT 8.6*  ALBUMIN 3.9   No results for input(s): LIPASE, AMYLASE in the last 168 hours. No results for input(s): AMMONIA in the last 168 hours.  CBC:  Recent Labs Lab 02/01/17 0619  WBC 11.2*  HGB 12.4  HCT 37.6  MCV 89.9  PLT 314    Cardiac Enzymes:  Recent Labs Lab 02/01/17 0619  TROPONINI 0.04*    BNP: Invalid input(s): POCBNP  CBG: No results for  input(s): GLUCAP in the last 168 hours.  Microbiology: Results for orders placed or performed during the hospital encounter of 11/10/16  MRSA PCR Screening     Status: None   Collection Time: 11/10/16  6:20 PM  Result Value Ref Range Status   MRSA by PCR NEGATIVE NEGATIVE Final    Comment:        The GeneXpert MRSA Assay (FDA approved for NASAL specimens only), is one component of a comprehensive MRSA colonization surveillance program. It is not intended to diagnose MRSA infection nor to guide or monitor treatment for MRSA infections.     Coagulation Studies: No results for input(s): LABPROT, INR in the last 72 hours.  Urinalysis: No results for input(s): COLORURINE, LABSPEC, PHURINE, GLUCOSEU, HGBUR, BILIRUBINUR, KETONESUR, PROTEINUR, UROBILINOGEN, NITRITE, LEUKOCYTESUR in the last 72 hours.  Invalid input(s): APPERANCEUR    Imaging: Dg Chest Portable 1 View  Result Date: 02/01/2017 CLINICAL DATA:  Acute onset dyspnea tonight EXAM: PORTABLE CHEST 1 VIEW COMPARISON:  11/14/2016 FINDINGS: Unchanged moderate cardiomegaly. Patchy airspace opacity in the central and basilar regions, worsened from 11/14/2016. This could represent alveolar edema associated with CHF, but infectious infiltrates are not excluded. Probable small left effusion. IMPRESSION: New/ worsened airspace opacities may represent alveolar edema. Cannot exclude infectious infiltrate. Electronically Signed   By: Andreas Newport M.D.   On: 02/01/2017 06:46  Medications:    . amitriptyline  10 mg Oral QHS  . amLODipine  5 mg Oral Daily  . aspirin  81 mg Oral Daily  . budesonide (PULMICORT) nebulizer solution  0.5 mg Nebulization BID  . calcium acetate  667 mg Oral TID WC  . [START ON 02/02/2017] cinacalcet  30 mg Oral Q breakfast  . cloNIDine  0.1 mg Oral BID  . furosemide  80 mg Intravenous Daily  . gabapentin  300 mg Oral BID  . heparin  5,000 Units Subcutaneous Q8H  . hydrALAZINE      . hydrALAZINE   10 mg Intravenous Once  . hydrALAZINE  50 mg Oral TID  . ipratropium-albuterol  3 mL Nebulization Q6H  . metoprolol succinate  50 mg Oral Daily  . multivitamin  1 tablet Oral QHS  . pantoprazole  40 mg Oral Daily  . rosuvastatin  5 mg Oral QHS   acetaminophen **OR** acetaminophen, diphenhydrAMINE, lidocaine, LORazepam, nitroGLYCERIN, ondansetron, oxyCODONE-acetaminophen **AND** oxyCODONE, prochlorperazine, promethazine, senna-docusate, traMADol  Assessment/ Plan:  56 y.o. female with end-stage renal disease secondary to polycystic kidney disease, severe hypertension, GERD, peritoneal dialysis and history of peritonitis, arthritis, chronic left hand pain, history of ruptured AV access Presents for acute pulmonary edema  TTS, [facility] [address]   1. End Stage Renal Disease: Patient significantly short of breath and volume overload at the moment. We will proceed with urgent hemodialysis at the bedside today.  2. Anemia of chronic kidney disease: Hemoglobin currently 12.4. Hold Epogen.  3. Acute respiratory failure. Secondary to high intradialytic weight gain. Continue bipap for now.  We will plan for ultrafiltration target of 3-4 kg today. We will likely plan for dialysis tomorrow as well.   4. Secondary Hyperparathyroidism:  Check intact PTH and phosphorus with dialysis today.   LOS: 0 Jasmine Holloway 6/25/20189:57 AM

## 2017-02-01 NOTE — Progress Notes (Signed)
Pre hd assessment  

## 2017-02-01 NOTE — ED Notes (Signed)
Respiratory at bedside to plce patient on bipap

## 2017-02-02 LAB — PHOSPHORUS: Phosphorus: 5.3 mg/dL — ABNORMAL HIGH (ref 2.5–4.6)

## 2017-02-02 LAB — BASIC METABOLIC PANEL
ANION GAP: 11 (ref 5–15)
BUN: 69 mg/dL — ABNORMAL HIGH (ref 6–20)
CALCIUM: 8.8 mg/dL — AB (ref 8.9–10.3)
CHLORIDE: 96 mmol/L — AB (ref 101–111)
CO2: 29 mmol/L (ref 22–32)
Creatinine, Ser: 10.44 mg/dL — ABNORMAL HIGH (ref 0.44–1.00)
GFR calc Af Amer: 4 mL/min — ABNORMAL LOW (ref >60)
GFR calc non Af Amer: 4 mL/min — ABNORMAL LOW (ref >60)
GLUCOSE: 121 mg/dL — AB (ref 65–99)
Potassium: 4.9 mmol/L (ref 3.5–5.1)
Sodium: 136 mmol/L (ref 135–145)

## 2017-02-02 LAB — MRSA PCR SCREENING: MRSA BY PCR: NEGATIVE

## 2017-02-02 LAB — TROPONIN I: TROPONIN I: 0.03 ng/mL — AB (ref <0.03)

## 2017-02-02 LAB — CBC
HEMATOCRIT: 33.3 % — AB (ref 35.0–47.0)
HEMOGLOBIN: 10.9 g/dL — AB (ref 12.0–16.0)
MCH: 29.9 pg (ref 26.0–34.0)
MCHC: 32.9 g/dL (ref 32.0–36.0)
MCV: 91.1 fL (ref 80.0–100.0)
Platelets: 271 10*3/uL (ref 150–440)
RBC: 3.66 MIL/uL — AB (ref 3.80–5.20)
RDW: 20.3 % — ABNORMAL HIGH (ref 11.5–14.5)
WBC: 6.7 10*3/uL (ref 3.6–11.0)

## 2017-02-02 LAB — HEPATITIS B SURFACE ANTIGEN: Hepatitis B Surface Ag: NEGATIVE

## 2017-02-02 MED ORDER — HYDRALAZINE HCL 50 MG PO TABS: 50.0000 mg | ORAL_TABLET | Freq: Three times a day (TID) | ORAL | 0 refills | Status: DC

## 2017-02-02 MED ORDER — ORAL CARE MOUTH RINSE: 15.0000 mL | Freq: Two times a day (BID) | OROMUCOSAL | Status: DC

## 2017-02-02 MED ORDER — CALCIUM ACETATE (PHOS BINDER) 667 MG PO CAPS: 667.0000 mg | ORAL_CAPSULE | Freq: Three times a day (TID) | ORAL | Status: DC

## 2017-02-02 NOTE — Discharge Instructions (Signed)
[facility] Failure Clinic appointment on February 08, 2017 at 10:40am with [name], [place]. Please call [phone] to reschedule.

## 2017-02-02 NOTE — Progress Notes (Signed)
Pre hd 

## 2017-02-02 NOTE — Progress Notes (Signed)
Discharge: Pt d/c from room via wheelchair, Family member with the pt. Discharge instructions given to the patient and family members.  No questions from pt, reintegrated to the pt to call or go to the ED for chest discomfort. Pt dressed in street clothes and left with discharge papers and prescriptions in hand. IV d/ced, tele removed and no complaints of pain or discomfort. 

## 2017-02-02 NOTE — Progress Notes (Signed)
Jasmine Holloway received an OR that PT wanted prayer. Jasmine Holloway went to room   02/02/17 0720  Clinical Encounter Type  Visited With Patient  Visit Type Initial  Referral From Nurse  Consult/Referral To Chaplain  Spiritual Encounters  Spiritual Needs Prayer   and prayed with PT.

## 2017-02-02 NOTE — Progress Notes (Signed)
Pt alert and oriented x4, no complaints of pain or discomfort.  Bed in low position, call bell within reach.  Bed alarms on and functioning.  Assessment done and charted.  Will continue to monitor and do hourly rounding throughout the shift 

## 2017-02-02 NOTE — Progress Notes (Signed)
Pre dialysis assessment 

## 2017-02-02 NOTE — Progress Notes (Signed)
Patient transferred out of the ICU yesterday Please call back with any questions Will sign off at this time

## 2017-02-02 NOTE — Progress Notes (Signed)
Pt off floor to dialysis

## 2017-02-02 NOTE — Discharge Summary (Signed)
[facility] NAME: Jasmine Holloway    MR#:  [id number]  DATE OF BIRTH:  09/28/1960  DATE OF ADMISSION:  02/01/2017 ADMITTING PHYSICIAN: Loletha Grayer, MD  DATE OF DISCHARGE: 02/02/2017  2:35 PM  PRIMARY CARE PHYSICIAN: Anthonette Legato, MD    ADMISSION DIAGNOSIS:  Pulmonary edema, acute ([place]) [J81.0]  DISCHARGE DIAGNOSIS:  Active Problems:   Acute respiratory failure with hypoxia ([place])   SECONDARY DIAGNOSIS:   Past Medical History:  Diagnosis Date  . Asthma   . CHF (congestive heart failure) ([place])   . COPD (chronic obstructive pulmonary disease) ([place])   . Diabetes mellitus without complication ([place])   . Diastolic heart failure ([place])   . ESRD (end stage renal disease) on dialysis ([place])   . Hypertension   . Neuropathy   . Polycystic kidney disease   . Renal insufficiency     HOSPITAL COURSE:   1. Acute hypoxic respiratory failure and acute respiratory distress on presentation. The patient was placed on BiPAP in the emergency room and admitted to the critical care unit. She had bedside dialysis which improved her respirations. She had another dialysis prior to disposition and she was taken off oxygen completely. Respiratory status improved. 2. Acute pulmonary edema and acute diastolic congestive heart failure. Patient had an incomplete dialysis session on Saturday prior to the admission which is likely the reason why she ended up in the hospital. She had 2 dialysis sessions during this hospital course which brought off 5 L. She had a couple doses of IV Lasix. Patient breathing much better on discharge. Continue Lasix, metoprolol, Cozaar. 3. Accelerated hypertension. Blood pressure much improved because she is breathing better. She was started on her usual medications. I advised her that she cannot abruptly stop clonidine. Hydralazine added to her medication list. 4. Demand ischemia with elevated troponin secondary to acute respiratory  distress, acute diastolic congestive heart failure and accelerated hypertension and being end-stage renal disease. Cardiac catheterization negative last year 5. Hyperkalemia. Dialysis managed this 6. Type 2 diabetes mellitus without consultation on diet control only 7. History of COPD. Respiratory status stable 8. History of neuropathy on gabapentin  DISCHARGE CONDITIONS:   Satisfactory  CONSULTS OBTAINED:  Treatment Team:  Flora Lipps, MD   Dr. Anthonette Legato  DRUG ALLERGIES:   Allergies  Allergen Reactions  . Morphine And Related Nausea And Vomiting  . Strawberry Extract   . Tramadol Nausea And Vomiting  . Venofer [Iron Sucrose]   . Vicodin [Hydrocodone-Acetaminophen] Nausea And Vomiting  . Buprenorphine Hcl Nausea And Vomiting  . Codeine Nausea And Vomiting  . Morphine Nausea And Vomiting    Halllucinations    DISCHARGE MEDICATIONS:   Discharge Medication List as of 02/02/2017  1:49 PM    CONTINUE these medications which have CHANGED   Details  calcium acetate (PHOSLO) 667 MG capsule Take 1 capsule (667 mg total) by mouth 3 (three) times daily with meals., Starting Tue 02/02/2017, No Print    hydrALAZINE (APRESOLINE) 50 MG tablet Take 1 tablet (50 mg total) by mouth 3 (three) times daily., Starting Tue 02/02/2017, Print      CONTINUE these medications which have NOT CHANGED   Details  amLODipine (NORVASC) 5 MG tablet Take 5 mg by mouth daily., Starting Fri 08/31/2014, Historical Med    aspirin 81 MG chewable tablet Chew 1 tablet (81 mg total) by mouth daily., Starting Fri 01/17/2016, Normal    b complex-vitamin c-folic acid (NEPHRO-VITE) 0.8 MG  TABS tablet Take 1 tablet by mouth at bedtime., Historical Med    cloNIDine (CATAPRES) 0.1 MG tablet Take 1 tablet (0.1 mg total) by mouth 2 (two) times daily., Starting Fri 01/17/2016, Normal    furosemide (LASIX) 80 MG tablet Take 1 tablet (80 mg total) by mouth daily., Starting Thu 03/05/2016, Until Wed 11/10/2017, Normal     gabapentin (NEURONTIN) 300 MG capsule Take 300 mg by mouth 2 (two) times daily. , Historical Med    losartan (COZAAR) 25 MG tablet Take 25 mg by mouth daily., Historical Med    metoprolol tartrate (LOPRESSOR) 50 MG tablet Take 50 mg by mouth 2 (two) times daily., Historical Med    omeprazole (PRILOSEC) 40 MG capsule Take by mouth daily. , Starting Sun 12/20/2016, Historical Med    VENTOLIN HFA 108 (90 Base) MCG/ACT inhaler Inhale 2 puffs into the lungs daily as needed. , Starting Thu 05/21/2016, Historical Med    acetaminophen (TYLENOL) 325 MG tablet Take 2 tablets (650 mg total) by mouth every 6 (six) hours as needed for mild pain or headache., Starting Wed 03/18/2016, OTC    diphenhydrAMINE (BENADRYL) 25 mg capsule Take 2 capsules (50 mg total) by mouth every 6 (six) hours as needed., Starting Fri 10/16/2016, Print    lidocaine (XYLOCAINE) 5 % ointment Apply topically., Historical Med    LORazepam (ATIVAN) 1 MG tablet Take 1 tablet (1 mg total) by mouth 2 (two) times daily as needed for anxiety., Starting Tue 07/21/2016, Print    metolazone (ZAROXOLYN) 5 MG tablet Take 1 tablet (5 mg total) by mouth daily as needed., Starting Mon 06/08/2016, Until Wed 11/10/2017, Normal    nitroGLYCERIN (NITROSTAT) 0.4 MG SL tablet Place 1 tablet (0.4 mg total) under the tongue every 5 (five) minutes as needed for chest pain., Starting Thu 01/14/2017, Until Wed 04/14/2017, Normal    ondansetron (ZOFRAN) 4 MG tablet Take 1 tablet (4 mg total) by mouth every 8 (eight) hours as needed for nausea or vomiting., Starting Tue 07/21/2016, Print    oxyCODONE-acetaminophen (PERCOCET) 10-325 MG tablet Take 1 tablet by mouth daily as needed for pain., Historical Med    prochlorperazine (COMPAZINE) 10 MG tablet Take 1 tablet (10 mg total) by mouth every 6 (six) hours as needed for nausea., Starting Thu 10/15/2016, Print    SENSIPAR 30 MG tablet Take 1 tablet (30 mg total) by mouth daily., Starting Tue 07/07/2016, Normal       STOP taking these medications     amitriptyline (ELAVIL) 10 MG tablet      metoprolol succinate (TOPROL-XL) 50 MG 24 hr tablet      promethazine (PHENERGAN) 25 MG tablet      rosuvastatin (CRESTOR) 5 MG tablet      traMADol (ULTRAM) 50 MG tablet          DISCHARGE INSTRUCTIONS:   Follow-up with dialysis as scheduled  If you experience worsening of your admission symptoms, develop shortness of breath, life threatening emergency, suicidal or homicidal thoughts you must seek medical attention immediately by calling 911 or calling your MD immediately  if symptoms less severe.  You Must read complete instructions/literature along with all the possible adverse reactions/side effects for all the Medicines you take and that have been prescribed to you. Take any new Medicines after you have completely understood and accept all the possible adverse reactions/side effects.   Please note  You were cared for by a hospitalist during your hospital stay. If you have any questions about your  discharge medications or the care you received while you were in the hospital after you are discharged, you can call the unit and asked to speak with the hospitalist on call if the hospitalist that took care of you is not available. Once you are discharged, your primary care physician will handle any further medical issues. Please note that NO REFILLS for any discharge medications will be authorized once you are discharged, as it is imperative that you return to your primary care physician (or establish a relationship with a primary care physician if you do not have one) for your aftercare needs so that they can reassess your need for medications and monitor your lab values.    Today   CHIEF COMPLAINT:   Chief Complaint  Patient presents with  . Respiratory Distress    HISTORY OF PRESENT ILLNESS:  Roniyah Llorens  is a 57 y.o. female presented with respiratory distress and found to be in heart  failure   VITAL SIGNS:  Blood pressure (!) (P) 164/96, pulse (P) 60, temperature (P) 97.6 F (36.4 C), temperature source (P) Oral, resp. rate (P) 19, height 5\' 3"  (1.6 m), weight 84.3 kg (185 lb 13.6 oz), SpO2 (P) 100 %.  Previous blood pressure 151/86  PHYSICAL EXAMINATION:  GENERAL:  56 y.o.-year-old patient lying in the bed with no acute distress.  EYES: Pupils equal, round, reactive to light and accommodation. No scleral icterus. Extraocular muscles intact.  HEENT: Head atraumatic, normocephalic. Oropharynx and nasopharynx clear.  NECK:  Supple, no jugular venous distention. No thyroid enlargement, no tenderness.  LUNGS: Normal breath sounds bilaterally, no wheezing, rales,rhonchi or crepitation. No use of accessory muscles of respiration.  CARDIOVASCULAR: S1, S2 normal. No murmurs, rubs, or gallops.  ABDOMEN: Soft, non-tender, non-distended. Bowel sounds present. No organomegaly or mass.  EXTREMITIES: Trace edema, cyanosis, or clubbing.  NEUROLOGIC: Cranial nerves II through XII are intact. Muscle strength 5/5 in all extremities. Sensation intact. Gait not checked.  PSYCHIATRIC: The patient is alert and oriented x 3.  SKIN: No obvious rash, lesion, or ulcer.   DATA REVIEW:   CBC  Recent Labs Lab 02/02/17 0612  WBC 6.7  HGB 10.9*  HCT 33.3*  PLT 271    Chemistries   Recent Labs Lab 02/01/17 0619 02/02/17 0612  NA 140 136  K 5.6* 4.9  CL 101 96*  CO2 26 29  GLUCOSE 85 121*  BUN 77* 69*  CREATININE 12.06* 10.44*  CALCIUM 9.2 8.8*  AST 34  --   ALT 14  --   ALKPHOS 80  --   BILITOT 0.9  --     Cardiac Enzymes  Recent Labs Lab 02/02/17 0612  TROPONINI 0.03*    Microbiology Results  Results for orders placed or performed during the hospital encounter of 02/01/17  MRSA PCR Screening     Status: None   Collection Time: 02/01/17  9:13 AM  Result Value Ref Range Status   MRSA by PCR NEGATIVE NEGATIVE Final    Comment:        The GeneXpert MRSA Assay  (FDA approved for NASAL specimens only), is one component of a comprehensive MRSA colonization surveillance program. It is not intended to diagnose MRSA infection nor to guide or monitor treatment for MRSA infections.     RADIOLOGY:  Dg Chest Portable 1 View  Result Date: 02/01/2017 CLINICAL DATA:  Acute onset dyspnea tonight EXAM: PORTABLE CHEST 1 VIEW COMPARISON:  11/14/2016 FINDINGS: Unchanged moderate cardiomegaly. Patchy airspace opacity in the central  and basilar regions, worsened from 11/14/2016. This could represent alveolar edema associated with CHF, but infectious infiltrates are not excluded. Probable small left effusion. IMPRESSION: New/ worsened airspace opacities may represent alveolar edema. Cannot exclude infectious infiltrate. Electronically Signed   By: Andreas Newport M.D.   On: 02/01/2017 06:46     Management plans discussed with the patient, And she is in agreement.  CODE STATUS:     Code Status Orders        Start     Ordered   02/01/17 0746  Full code  Continuous     02/01/17 0746    Code Status History    Date Active Date Inactive Code Status Order ID Comments User Context   11/10/2016  8:21 AM 11/11/2016  5:54 PM Full Code [id number]  Weston, Hewlett, DO Inpatient   07/04/2016  5:09 AM 07/07/2016  8:31 PM Full Code [id number]  Mikael Spray, NP ED   06/09/2016  4:58 AM 06/10/2016  7:32 PM Full Code [id number]  Awilda Bill, NP ED   05/11/2016 10:24 AM 05/12/2016  6:26 PM Full Code [id number]  Vaughan Basta, MD Inpatient   04/28/2016  8:30 PM 04/30/2016  5:49 PM Full Code [id number]  Gladstone Lighter, MD Inpatient   03/28/2016  6:25 PM 03/29/2016  5:26 PM Full Code [id number]  Vaughan Basta, MD ED   03/17/2016  6:06 AM 03/18/2016  7:51 AM Full Code [id number]  Saundra Shelling, MD ED   01/10/2016  9:11 PM 01/17/2016  9:36 PM Full Code [id number]  Mikael Spray, NP ED   12/21/2015  6:08 AM 12/22/2015  4:09 PM Full Code [id number]  Saundra Shelling, MD Inpatient   02/24/2015  3:52 AM 02/24/2015  4:15 PM Full Code [id number]  Harrie Foreman, MD Inpatient    Advance Directive Documentation     Most Recent Value  Type of Advance Directive  Healthcare Power of Attorney [Brother Edmund]  Pre-existing out of facility DNR order (yellow form or pink MOST form)  -  "MOST" Form in Place?  -      TOTAL TIME TAKING CARE OF THIS PATIENT: 35 minutes.    Loletha Grayer M.D on 02/02/2017 at 4:12 PM  Between 7am to 6pm - Pager - [phone]  After 6pm go to [email or web address] - password [email or web address]  [facility] Office  [phone]  CC: Primary care physician; Anthonette Legato, MD

## 2017-02-02 NOTE — Progress Notes (Signed)
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 Phosphorous sent to lab per order. Dr. Holley Raring at bedside

## 2017-02-02 NOTE — Progress Notes (Signed)
Pt back from dialysis,  Will be discharged to home

## 2017-02-02 NOTE — Progress Notes (Signed)
[facility]  ROUNDING NOTE   Subjective:  Patient doing better today. Shortness of breath imrpoved. Due for another dialysis session today.    Objective:  Vital signs in last 24 hours:  Temp:  [96.8 F (36 C)-98.1 F (36.7 C)] 97.9 F (36.6 C) (06/26 0434) Pulse Rate:  [64-86] 68 (06/26 0434) Resp:  [13-26] 18 (06/26 0434) BP: (138-222)/(74-111) 146/75 (06/26 0434) SpO2:  [96 %-100 %] 98 % (06/26 0735) Weight:  [84.1 kg (185 lb 8 oz)] 84.1 kg (185 lb 8 oz) (06/25 2334)  Weight change: -0.047 kg (-1.7 oz) Filed Weights   02/01/17 0616 02/01/17 0910 02/01/17 2334  Weight: 81.6 kg (180 lb) 81.6 kg (179 lb 14.3 oz) 84.1 kg (185 lb 8 oz)    Intake/Output: I/O last 3 completed shifts: In: -  Out: 2850 [Other:2850]   Intake/Output this shift:  No intake/output data recorded.  Physical Exam: General: No acute distress   Head: Normocephalic, atraumatic. Moist oral mucosal membranes  Eyes: Anicteric  Neck: Supple, trachea midline  Lungs:  Minimal basilar rales, normal effort  Heart: S1S2 no rubs  Abdomen:  Soft, nontender, bowel sounds present  Extremities: 1+ peripheral edema.  Neurologic: Awake, alert, following commands  Skin: No lesions  Access: RUE AVG    Basic Metabolic Panel:  Recent Labs Lab 02/01/17 0619 02/01/17 0930 02/02/17 0612  NA 140  --  136  K 5.6*  --  4.9  CL 101  --  96*  CO2 26  --  29  GLUCOSE 85  --  121*  BUN 77*  --  69*  CREATININE 12.06*  --  10.44*  CALCIUM 9.2  --  8.8*  PHOS  --  5.9*  --     Liver Function Tests:  Recent Labs Lab 02/01/17 0619  AST 34  ALT 14  ALKPHOS 80  BILITOT 0.9  PROT 8.6*  ALBUMIN 3.9   No results for input(s): LIPASE, AMYLASE in the last 168 hours. No results for input(s): AMMONIA in the last 168 hours.  CBC:  Recent Labs Lab 02/01/17 0619 02/02/17 0612  WBC 11.2* 6.7  HGB 12.4 10.9*  HCT 37.6 33.3*  MCV 89.9 91.1  PLT 314 271    Cardiac Enzymes:  Recent  Labs Lab 02/01/17 0619 02/01/17 0930 02/02/17 0612  TROPONINI 0.04* 0.04* 0.03*    BNP: Invalid input(s): POCBNP  CBG:  Recent Labs Lab 02/01/17 0908  GLUCAP 104*    Microbiology: Results for orders placed or performed during the hospital encounter of 02/01/17  MRSA PCR Screening     Status: None   Collection Time: 02/01/17  9:13 AM  Result Value Ref Range Status   MRSA by PCR NEGATIVE NEGATIVE Final    Comment:        The GeneXpert MRSA Assay (FDA approved for NASAL specimens only), is one component of a comprehensive MRSA colonization surveillance program. It is not intended to diagnose MRSA infection nor to guide or monitor treatment for MRSA infections.     Coagulation Studies: No results for input(s): LABPROT, INR in the last 72 hours.  Urinalysis: No results for input(s): COLORURINE, LABSPEC, PHURINE, GLUCOSEU, HGBUR, BILIRUBINUR, KETONESUR, PROTEINUR, UROBILINOGEN, NITRITE, LEUKOCYTESUR in the last 72 hours.  Invalid input(s): APPERANCEUR    Imaging: Dg Chest Portable 1 View  Result Date: 02/01/2017 CLINICAL DATA:  Acute onset dyspnea tonight EXAM: PORTABLE CHEST 1 VIEW COMPARISON:  11/14/2016 FINDINGS: Unchanged moderate cardiomegaly. Patchy airspace opacity in the central and basilar  regions, worsened from 11/14/2016. This could represent alveolar edema associated with CHF, but infectious infiltrates are not excluded. Probable small left effusion. IMPRESSION: New/ worsened airspace opacities may represent alveolar edema. Cannot exclude infectious infiltrate. Electronically Signed   By: Andreas Newport M.D.   On: 02/01/2017 06:46     Medications:    . amitriptyline  10 mg Oral QHS  . amLODipine  5 mg Oral Daily  . aspirin  81 mg Oral Daily  . budesonide (PULMICORT) nebulizer solution  0.5 mg Nebulization BID  . calcium acetate  667 mg Oral TID WC  . cinacalcet  30 mg Oral Q breakfast  . cloNIDine  0.1 mg Oral BID  . furosemide  80 mg  Intravenous Daily  . gabapentin  300 mg Oral BID  . heparin  5,000 Units Subcutaneous Q8H  . hydrALAZINE  50 mg Oral TID  . ipratropium-albuterol  3 mL Nebulization Q6H  . mouth rinse  15 mL Mouth Rinse BID  . metoprolol succinate  50 mg Oral Daily  . multivitamin  1 tablet Oral QHS  . pantoprazole  40 mg Oral Daily  . rosuvastatin  5 mg Oral QHS   acetaminophen **OR** acetaminophen, diphenhydrAMINE, labetalol, lidocaine, LORazepam, nitroGLYCERIN, ondansetron, oxyCODONE-acetaminophen **AND** oxyCODONE, prochlorperazine, promethazine, senna-docusate, traMADol  Assessment/ Plan:  56 y.o. female with end-stage renal disease secondary to polycystic kidney disease, severe hypertension, GERD, peritoneal dialysis and history of peritonitis, arthritis, chronic left hand pain, history of ruptured AV access Presents for acute pulmonary edema  TTS, [facility] [address]   1. End Stage Renal Disease: Patient much improved today, will plan for additional dialysis treatment today, UF target 2kg today.  Patient seen and evaluated during the dialysis treatment, BFR 150 currently but will be titrated up.  2. Anemia of chronic kidney disease: Hgb currently 10.9, resume epogen as outpt.  3. Acute respiratory failure. Significantly improved with dialysis, now off bipap.   4. Secondary Hyperparathyroidism:  Phos 5.9 and close to target, continue sensipar and calcium acetate.     LOS: 1 Burman Bruington 6/26/20189:17 AM

## 2017-02-02 NOTE — Care Management Note (Addendum)
Case Management Note  Patient Details  Name: Jasmine Holloway MRN: [id number] Date of Birth: [birth year]  Subjective/Objective:                  Spoke with patient regarding discharge planning. She anticipates returning home today by taxi. She states she does not know how to contact taxi however she states she has used them before- she could not recall name of taxi service. She states she has no problems getting to PCP as she states her nephrologist acts as PCP. She states she has no problems getting to outpatient dialysis or obtaining medications. She states she uses a cane when her neuropathy is acting up. She states it is not acting up today. She is not on O2 at this time and does not wear it at home.   Action/Plan: CSW updated regarding Taxi request. No RNCM needs. Identified.   Expected Discharge Date:  02/02/17               Expected Discharge Plan:     In-House Referral:     Discharge planning Services  CM Consult  Post Acute Care Choice:    Choice offered to:  Patient  DME Arranged:    DME Agency:     HH Arranged:    [place] Agency:     Status of Service:  Completed, signed off  If discussed at [employer] of Stay Meetings, dates discussed:    Additional Comments:  Marshell Garfinkel, RN 02/02/2017, 1:58 PM

## 2017-02-03 ENCOUNTER — Telehealth: Payer: Self-pay

## 2017-02-03 NOTE — Telephone Encounter (Signed)
Called to advise patient that she missed appointment with PCP [facility] where she was supposed to get pulmonary clearance for EGD.   Patient states she was in ER due to sudden onset dyspnea.  She wanted to know if we could use the chest x-ray taken in the ER for her pulmonary clearance.   [name] @ [phone] for clearance information from the Anesthesiologist.

## 2017-02-03 NOTE — Telephone Encounter (Signed)
-----   Message from Jonathon Bellows, MD sent at 02/03/2017 12:13 PM EDT -----   ----- Message ----- From: Valora Corporal, RN Sent: 02/02/2017   5:21 PM To: Jonathon Bellows, MD  Here is the Cardiac Clearance requested for this patient. Please let me know if you need anything else.  Thanks, Lesleigh Noe RN, BSN [facility] [phone] (Direct # [phone])

## 2017-02-04 ENCOUNTER — Encounter
Admission: RE | Disposition: A | Discharge status: Home / Self Care | Payer: Self-pay | Source: Ambulatory Visit | Attending: Gastroenterology

## 2017-02-04 ENCOUNTER — Ambulatory Visit
Admission: RE | Admit: 2017-02-04 | Discharge: 2017-02-04 | Disposition: A | Discharge status: Home / Self Care | Payer: Medicare Other | Source: Ambulatory Visit | Attending: Gastroenterology | Admitting: Gastroenterology

## 2017-02-04 ENCOUNTER — Ambulatory Visit: Payer: Medicare Other | Admitting: Anesthesiology

## 2017-02-04 ENCOUNTER — Encounter: Payer: Self-pay | Admitting: *Deleted

## 2017-02-04 DIAGNOSIS — K222 Esophageal obstruction: Secondary | ICD-10-CM | POA: Diagnosis not present

## 2017-02-04 DIAGNOSIS — E1140 Type 2 diabetes mellitus with diabetic neuropathy, unspecified: Secondary | ICD-10-CM | POA: Diagnosis not present

## 2017-02-04 DIAGNOSIS — J449 Chronic obstructive pulmonary disease, unspecified: Secondary | ICD-10-CM | POA: Diagnosis not present

## 2017-02-04 DIAGNOSIS — Z885 Allergy status to narcotic agent status: Secondary | ICD-10-CM | POA: Insufficient documentation

## 2017-02-04 DIAGNOSIS — Q613 Polycystic kidney, unspecified: Secondary | ICD-10-CM | POA: Diagnosis not present

## 2017-02-04 DIAGNOSIS — I132 Hypertensive heart and chronic kidney disease with heart failure and with stage 5 chronic kidney disease, or end stage renal disease: Secondary | ICD-10-CM | POA: Diagnosis not present

## 2017-02-04 DIAGNOSIS — Z87891 Personal history of nicotine dependence: Secondary | ICD-10-CM | POA: Diagnosis not present

## 2017-02-04 DIAGNOSIS — E1122 Type 2 diabetes mellitus with diabetic chronic kidney disease: Secondary | ICD-10-CM | POA: Diagnosis not present

## 2017-02-04 DIAGNOSIS — I5032 Chronic diastolic (congestive) heart failure: Secondary | ICD-10-CM | POA: Insufficient documentation

## 2017-02-04 DIAGNOSIS — Z7982 Long term (current) use of aspirin: Secondary | ICD-10-CM | POA: Diagnosis not present

## 2017-02-04 DIAGNOSIS — D649 Anemia, unspecified: Secondary | ICD-10-CM | POA: Diagnosis not present

## 2017-02-04 DIAGNOSIS — Z79899 Other long term (current) drug therapy: Secondary | ICD-10-CM | POA: Insufficient documentation

## 2017-02-04 DIAGNOSIS — Z91018 Allergy to other foods: Secondary | ICD-10-CM | POA: Insufficient documentation

## 2017-02-04 DIAGNOSIS — Z8249 Family history of ischemic heart disease and other diseases of the circulatory system: Secondary | ICD-10-CM | POA: Insufficient documentation

## 2017-02-04 DIAGNOSIS — Z888 Allergy status to other drugs, medicaments and biological substances status: Secondary | ICD-10-CM | POA: Diagnosis not present

## 2017-02-04 DIAGNOSIS — N186 End stage renal disease: Secondary | ICD-10-CM | POA: Diagnosis not present

## 2017-02-04 DIAGNOSIS — Z992 Dependence on renal dialysis: Secondary | ICD-10-CM | POA: Insufficient documentation

## 2017-02-04 DIAGNOSIS — D62 Acute posthemorrhagic anemia: Secondary | ICD-10-CM | POA: Diagnosis present

## 2017-02-04 HISTORY — PX: ESOPHAGOGASTRODUODENOSCOPY (EGD) WITH PROPOFOL: SHX5813

## 2017-02-04 SURGERY — ESOPHAGOGASTRODUODENOSCOPY (EGD) WITH PROPOFOL
Anesthesia: General

## 2017-02-04 MED ORDER — PROPOFOL 10 MG/ML IV BOLUS
INTRAVENOUS | Status: DC | PRN
  Administered 2017-02-04: 50 mg via INTRAVENOUS

## 2017-02-04 MED ORDER — PROPOFOL 500 MG/50ML IV EMUL
INTRAVENOUS | Status: DC | PRN
  Administered 2017-02-04: 175 ug/kg/min via INTRAVENOUS

## 2017-02-04 MED ORDER — PROPOFOL 10 MG/ML IV BOLUS
INTRAVENOUS | Status: AC
  Filled 2017-02-04: qty 20

## 2017-02-04 MED ORDER — LIDOCAINE HCL (PF) 1 % IJ SOLN
INTRAMUSCULAR | Status: AC
  Filled 2017-02-04: qty 2

## 2017-02-04 MED ORDER — FENTANYL CITRATE (PF) 100 MCG/2ML IJ SOLN
INTRAMUSCULAR | Status: AC
  Filled 2017-02-04: qty 2

## 2017-02-04 MED ORDER — FENTANYL CITRATE (PF) 100 MCG/2ML IJ SOLN
INTRAMUSCULAR | Status: DC | PRN
  Administered 2017-02-04: 50 ug via INTRAVENOUS

## 2017-02-04 MED ORDER — LIDOCAINE HCL (PF) 1 % IJ SOLN
2.0000 mL | Freq: Once | INTRAMUSCULAR | Status: AC
  Administered 2017-02-04: 0.4 mL via SUBCUTANEOUS

## 2017-02-04 MED ORDER — LIDOCAINE HCL (PF) 2 % IJ SOLN
INTRAMUSCULAR | Status: AC
  Filled 2017-02-04: qty 2

## 2017-02-04 MED ORDER — MIDAZOLAM HCL 2 MG/2ML IJ SOLN
INTRAMUSCULAR | Status: AC
  Filled 2017-02-04: qty 2

## 2017-02-04 MED ORDER — SODIUM CHLORIDE 0.9 % IV SOLN
INTRAVENOUS | Status: DC
  Administered 2017-02-04: 10:00:00 via INTRAVENOUS

## 2017-02-04 MED ORDER — GLYCOPYRROLATE 0.2 MG/ML IJ SOLN
INTRAMUSCULAR | Status: DC | PRN
  Administered 2017-02-04: 0.2 mg via INTRAVENOUS

## 2017-02-04 MED ORDER — MIDAZOLAM HCL 2 MG/2ML IJ SOLN
INTRAMUSCULAR | Status: DC | PRN
  Administered 2017-02-04: 1 mg via INTRAVENOUS

## 2017-02-04 MED ORDER — LIDOCAINE HCL (CARDIAC) 20 MG/ML IV SOLN
INTRAVENOUS | Status: DC | PRN
  Administered 2017-02-04: 40 mg via INTRAVENOUS

## 2017-02-04 MED ORDER — GLYCOPYRROLATE 0.2 MG/ML IJ SOLN
INTRAMUSCULAR | Status: AC
  Filled 2017-02-04: qty 1

## 2017-02-04 NOTE — Transfer of Care (Signed)
Immediate Anesthesia Transfer of Care Note  Patient: Michaelle Bottomley  Procedure(s) Performed: Procedure(s): ESOPHAGOGASTRODUODENOSCOPY (EGD) WITH PROPOFOL (N/A)  Patient Location: PACU and Endoscopy Unit  Anesthesia Type:General  Level of Consciousness: sedated  Airway & Oxygen Therapy: Patient Spontanous Breathing and Patient connected to nasal cannula oxygen  Post-op Assessment: Report given to RN and Post -op Vital signs reviewed and stable  Post vital signs: Reviewed and stable  Last Vitals:  Vitals:   02/04/17 0838 02/04/17 1030  BP: (!) 185/94 (!) 174/102  Pulse: 65 84  Resp: 18 16  Temp: 36.3 C 80.6 C    Complications: No apparent anesthesia complications

## 2017-02-04 NOTE — Anesthesia Preprocedure Evaluation (Signed)
Anesthesia Evaluation  Patient identified by MRN, date of birth, ID band Patient awake    Reviewed: Allergy & Precautions, H&P , NPO status , Patient's Chart, lab work & pertinent test results, reviewed documented beta blocker date and time   Airway Mallampati: II   Neck ROM: full    Dental  (+) Poor Dentition   Pulmonary neg pulmonary ROS, shortness of breath and with exertion, asthma , COPD, former smoker,    Pulmonary exam normal breath sounds clear to auscultation + decreased breath sounds(-) wheezing  (-) rales    Cardiovascular Exercise Tolerance: Poor hypertension, On Medications +CHF  negative cardio ROS Normal cardiovascular exam Rhythm:regular Rate:Normal  Recent admission for chf on June 25.  Discharge on 26 after 2 round of HD.  Presently, better without complaints of sob.  She feels like her breathing is comfortable. No Problems.Madaline Brilliant to proceed with GA for above. JA   Neuro/Psych negative neurological ROS  negative psych ROS   GI/Hepatic negative GI ROS, Neg liver ROS,   Endo/Other  negative endocrine ROSdiabetes  Renal/GU ESRF and Dialysisnegative Renal ROS  negative genitourinary   Musculoskeletal   Abdominal   Peds  Hematology negative hematology ROS (+) anemia ,   Anesthesia Other Findings Past Medical History: No date: Asthma No date: CHF (congestive heart failure) (HCC) No date: COPD (chronic obstructive pulmonary disease) (* No date: Diabetes mellitus without complication (HCC) No date: Diastolic heart failure (HCC) No date: ESRD (end stage renal disease) on dialysis ([facility]* No date: Hypertension No date: Neuropathy No date: Polycystic kidney disease No date: Renal insufficiency Past Surgical History: No date: ARTERIOVENOUS GRAFT PLACEMENT Right     Comment: x3 (R forearm currently used for access) 12/03/2015: BREAST BIOPSY Left     Comment: neg No date: COLON SURGERY 08/04/2016: PERIPHERAL  VASCULAR CATHETERIZATION Right     Comment: Procedure: A/V Shuntogram;  Surgeon: Algernon Huxley, MD;  Location: [facility];                Service: Cardiovascular;  Laterality: Right; No date: VEIN HARVEST BMI    Body Mass Index:  32.77 kg/m     Reproductive/Obstetrics negative OB ROS                             Anesthesia Physical Anesthesia Plan  ASA: IV  Anesthesia Plan: General   Post-op Pain Management:    Induction:   PONV Risk Score and Plan: 3 and Ondansetron, Dexamethasone, Propofol and Midazolam  Airway Management Planned:   Additional Equipment:   Intra-op Plan:   Post-operative Plan:   Informed Consent: I have reviewed the patients History and Physical, chart, labs and discussed the procedure including the risks, benefits and alternatives for the proposed anesthesia with the patient or authorized representative who has indicated his/her understanding and acceptance.   Dental Advisory Given  Plan Discussed with: CRNA  Anesthesia Plan Comments:         Anesthesia Quick Evaluation

## 2017-02-04 NOTE — Anesthesia Procedure Notes (Signed)
Date/Time: 02/04/2017 10:16 AM Performed by: Doreen Salvage Pre-anesthesia Checklist: Patient identified, Emergency Drugs available, Suction available and Patient being monitored Patient Re-evaluated:Patient Re-evaluated prior to inductionOxygen Delivery Method: Nasal cannula Intubation Type: IV induction Dental Injury: Teeth and Oropharynx as per pre-operative assessment  Comments: Nasal cannula with etCO2 monitoring

## 2017-02-04 NOTE — H&P (Signed)
Jonathon Bellows MD [address], [place], [place] [address] Phone: [phone] Fax : [phone]  Primary Care Physician:  Anthonette Legato, MD Primary Gastroenterologist:  Dr. Jonathon Bellows   Pre-Procedure History & Physical: HPI:  Jasmine Holloway is a 56 y.o. female is here for an endoscopy.   Past Medical History:  Diagnosis Date  . Asthma   . CHF (congestive heart failure) ([place])   . COPD (chronic obstructive pulmonary disease) ([place])   . Diabetes mellitus without complication ([place])   . Diastolic heart failure ([place])   . ESRD (end stage renal disease) on dialysis ([place])   . Hypertension   . Neuropathy   . Polycystic kidney disease   . Renal insufficiency     Past Surgical History:  Procedure Laterality Date  . ARTERIOVENOUS GRAFT PLACEMENT Right    x3 (R forearm currently used for access)  . BREAST BIOPSY Left 12/03/2015   neg  . COLON SURGERY    . PERIPHERAL VASCULAR CATHETERIZATION Right 08/04/2016   Procedure: A/V Shuntogram;  Surgeon: Algernon Huxley, MD;  Location: [facility];  Service: Cardiovascular;  Laterality: Right;  . VEIN HARVEST      Prior to Admission medications   Medication Sig Start Date End Date Taking? Authorizing Provider  amitriptyline (ELAVIL) 10 MG tablet  11/20/16  Yes [provider]  amLODipine (NORVASC) 5 MG tablet Take 5 mg by mouth daily. 08/31/14  Yes [provider]  calcium acetate (PHOSLO) 667 MG capsule Take 1 capsule (667 mg total) by mouth 3 (three) times daily with meals. 02/02/17  Yes Wieting, Richard, MD  cloNIDine (CATAPRES) 0.1 MG tablet Take 1 tablet (0.1 mg total) by mouth 2 (two) times daily. 01/17/16  Yes Gladstone Lighter, MD  furosemide (LASIX) 80 MG tablet Take 1 tablet (80 mg total) by mouth daily. 03/05/16 11/10/17 Yes Hackney, Otila Kluver A, FNP  gabapentin (NEURONTIN) 300 MG capsule Take 300 mg by mouth 2 (two) times daily.    Yes [provider]  hydrALAZINE (APRESOLINE) 50 MG tablet Take 1 tablet  (50 mg total) by mouth 3 (three) times daily. 02/02/17  Yes Wieting, Richard, MD  losartan (COZAAR) 25 MG tablet Take 25 mg by mouth daily.   Yes [provider]  metolazone (ZAROXOLYN) 5 MG tablet Take 1 tablet (5 mg total) by mouth daily as needed. 06/08/16 11/10/17 Yes Gollan, Kathlene November, MD  metoprolol succinate (TOPROL-XL) 50 MG 24 hr tablet  01/22/17  Yes [provider]  omeprazole (PRILOSEC) 40 MG capsule Take by mouth daily.  12/20/16  Yes [provider]  VENTOLIN HFA 108 (90 Base) MCG/ACT inhaler Inhale 2 puffs into the lungs daily as needed.  05/21/16  Yes [provider]  acetaminophen (TYLENOL) 325 MG tablet Take 2 tablets (650 mg total) by mouth every 6 (six) hours as needed for mild pain or headache. 03/18/16   Nicholes Mango, MD  aspirin 81 MG chewable tablet Chew 1 tablet (81 mg total) by mouth daily. 01/17/16   Gladstone Lighter, MD  b complex-vitamin c-folic acid (NEPHRO-VITE) 0.8 MG TABS tablet Take 1 tablet by mouth at bedtime.    [provider]  diphenhydrAMINE (BENADRYL) 25 mg capsule Take 2 capsules (50 mg total) by mouth every 6 (six) hours as needed. 10/16/16   Carrie Mew, MD  lidocaine (XYLOCAINE) 5 % ointment Apply topically.    [provider]  LORazepam (ATIVAN) 1 MG tablet Take 1 tablet (1 mg total) by mouth 2 (two)  times daily as needed for anxiety. 07/21/16   Daymon Larsen, MD  metoprolol tartrate (LOPRESSOR) 50 MG tablet Take 50 mg by mouth 2 (two) times daily.    [provider]  nitroGLYCERIN (NITROSTAT) 0.4 MG SL tablet Place 1 tablet (0.4 mg total) under the tongue every 5 (five) minutes as needed for chest pain. 01/14/17 04/14/17  Minna Merritts, MD  ondansetron (ZOFRAN) 4 MG tablet Take 1 tablet (4 mg total) by mouth every 8 (eight) hours as needed for nausea or vomiting. 07/21/16   Daymon Larsen, MD  oxyCODONE-acetaminophen (PERCOCET) 10-325 MG tablet Take 1 tablet by mouth daily as needed for  pain.    [provider]  prochlorperazine (COMPAZINE) 10 MG tablet Take 1 tablet (10 mg total) by mouth every 6 (six) hours as needed for nausea. 10/15/16   Paulette Blanch, MD  SENSIPAR 30 MG tablet Take 1 tablet (30 mg total) by mouth daily. Patient not taking: Reported on 02/01/2017 07/07/16   Fritzi Mandes, MD    Allergies as of 12/24/2016 - Review Complete 12/22/2016  Allergen Reaction Noted  . Morphine and related Nausea And Vomiting 02/24/2015  . Strawberry extract  08/18/2016  . Tramadol Nausea And Vomiting 08/05/2016  . Venofer [iron sucrose]  08/18/2016  . Vicodin [hydrocodone-acetaminophen] Nausea And Vomiting 02/24/2015  . Buprenorphine hcl Nausea And Vomiting 12/21/2015  . Codeine Nausea And Vomiting 12/21/2015  . Morphine Nausea And Vomiting 10/24/2014    Family History  Problem Relation Age of Onset  . Hypertension Brother   . Heart failure Brother   . Hypertension Mother   . Heart disease Mother     Social History   Social History  . Marital status: Single    Spouse name: N/A  . Number of children: N/A  . Years of education: N/A   Occupational History  . disabled    Social History Main Topics  . Smoking status: Former Smoker    Packs/day: 0.25    Quit date: 08/11/2015  . Smokeless tobacco: Never Used  . Alcohol use No  . Drug use: No  . Sexual activity: Not on file   Other Topics Concern  . Not on file   Social History Narrative   Lives alone    Review of Systems: See HPI, otherwise negative ROS  Physical Exam: BP (!) 185/94   Pulse 65   Temp 97.3 F (36.3 C) (Tympanic)   Resp 18   Ht 5\' 3"  (1.6 m)   Wt 185 lb (83.9 kg)   BMI 32.77 kg/m  General:   Alert,  pleasant and cooperative in NAD Head:  Normocephalic and atraumatic. Neck:  Supple; no masses or thyromegaly. Lungs:  Clear throughout to auscultation.    Heart:  Regular rate and rhythm. Abdomen:  Soft, nontender and nondistended. Normal bowel sounds, without guarding, and  without rebound.   Neurologic:  Alert and  oriented x4;  grossly normal neurologically.  Impression/Plan: Jasmine Holloway is here for an endoscopy to be performed for anemia  Risks, benefits, limitations, and alternatives regarding  endoscopy have been reviewed with the patient.  Questions have been answered.  All parties agreeable.   Jonathon Bellows, MD  02/04/2017, 10:10 AM

## 2017-02-04 NOTE — Pre-Procedure Instructions (Signed)
SPOKE WITH DR J ADAMS WHO DISCUSSED WITH DR Viktoriya OK TO HAVE PATIENT COME IN AND BE ASSESSED TODAY. IF OK WILL PROCEED. IF SOB CAN BE RESCHEDULED. NOTIFIED TONYA AT DR [facility] OFFICE

## 2017-02-04 NOTE — Anesthesia Post-op Follow-up Note (Cosign Needed)
Anesthesia QCDR form completed.        

## 2017-02-04 NOTE — Op Note (Signed)
[facility]astroenterology Patient Name: Jasmine Holloway Procedure Date: 02/04/2017 10:11 AM MRN: [id number] Account #: [id number] Date of Birth: 1960/12/03 Admit Type: Outpatient Age: 56 Room: [facility] ENDO ROOM 4 Gender: Female Note Status: Finalized Procedure:            Upper GI endoscopy Indications:          Acute post hemorrhagic anemia Providers:            Jonathon Bellows MD, MD Referring MD:         Tama High, MD (Referring MD) Medicines:            Monitored Anesthesia Care Complications:        No immediate complications. Procedure:            Pre-Anesthesia Assessment:                       - ASA Grade Assessment: IV - A patient with severe                        systemic disease that is a constant threat to life.                       After obtaining informed consent, the endoscope was                        passed under direct vision. Throughout the procedure,                        the patient's blood pressure, pulse, and oxygen                        saturations were monitored continuously. The Endoscope                        was introduced through the mouth, and advanced to the                        third part of duodenum. The upper GI endoscopy was                        accomplished with ease. The patient tolerated the                        procedure well. Findings:      The examined duodenum was normal.      The stomach was normal.      One mild benign-appearing, intrinsic stenosis was found in the lower       third of the esophagus. This measured 1.6 cm (inner diameter) x less       than one cm (in length) and was traversed. Impression:           - Normal examined duodenum.                       - Normal stomach.                       - Benign-appearing esophageal stenosis.                       - No specimens collected. Recommendation:       -  Discharge patient to home (with escort).                       - Resume previous diet.               - Continue present medications.                       - Return to my office in 2 weeks. Procedure Code(s):    --- Professional ---                       [id number], Esophagogastroduodenoscopy, flexible, transoral;                        diagnostic, including collection of specimen(s) by                        brushing or washing, when performed (separate procedure) Diagnosis Code(s):    --- Professional ---                       K22.2, Esophageal obstruction                       D62, Acute posthemorrhagic anemia CPT copyright 2016 American Medical Association. All rights reserved. The codes documented in this report are preliminary and upon coder review may  be revised to meet current compliance requirements. Jonathon Bellows, MD Jonathon Bellows MD, MD 02/04/2017 10:27:30 AM This report has been signed electronically. Number of Addenda: 0 Note Initiated On: 02/04/2017 10:11 AM      [facility]

## 2017-02-05 ENCOUNTER — Encounter: Payer: Self-pay | Admitting: Gastroenterology

## 2017-02-07 NOTE — Anesthesia Postprocedure Evaluation (Signed)
Anesthesia Post Note  Patient: Ikia Cincotta  Procedure(s) Performed: Procedure(s) (LRB): ESOPHAGOGASTRODUODENOSCOPY (EGD) WITH PROPOFOL (N/A)  Patient location during evaluation: PACU Anesthesia Type: General Level of consciousness: awake and alert Pain management: pain level controlled Vital Signs Assessment: post-procedure vital signs reviewed and stable Respiratory status: spontaneous breathing, nonlabored ventilation, respiratory function stable and patient connected to nasal cannula oxygen Cardiovascular status: blood pressure returned to baseline and stable Postop Assessment: no signs of nausea or vomiting Anesthetic complications: no     Last Vitals:  Vitals:   02/04/17 1050 02/04/17 1100  BP: (!) 209/116 (!) 198/117  Pulse: 81 80  Resp: 16 16  Temp:      Last Pain:  Vitals:   02/04/17 0838  TempSrc: Tympanic                 Molli Barrows

## 2017-02-08 ENCOUNTER — Encounter: Payer: Self-pay | Admitting: Family

## 2017-02-08 ENCOUNTER — Ambulatory Visit: Payer: Medicare Other | Attending: Family | Admitting: Family

## 2017-02-08 VITALS — BP 179/97 | HR 81 | Resp 20 | Ht 63.0 in | Wt 191.0 lb

## 2017-02-08 DIAGNOSIS — Q613 Polycystic kidney, unspecified: Secondary | ICD-10-CM | POA: Diagnosis not present

## 2017-02-08 DIAGNOSIS — I132 Hypertensive heart and chronic kidney disease with heart failure and with stage 5 chronic kidney disease, or end stage renal disease: Secondary | ICD-10-CM | POA: Diagnosis not present

## 2017-02-08 DIAGNOSIS — E1122 Type 2 diabetes mellitus with diabetic chronic kidney disease: Secondary | ICD-10-CM | POA: Diagnosis not present

## 2017-02-08 DIAGNOSIS — Z8249 Family history of ischemic heart disease and other diseases of the circulatory system: Secondary | ICD-10-CM | POA: Insufficient documentation

## 2017-02-08 DIAGNOSIS — J449 Chronic obstructive pulmonary disease, unspecified: Secondary | ICD-10-CM | POA: Diagnosis not present

## 2017-02-08 DIAGNOSIS — Z992 Dependence on renal dialysis: Secondary | ICD-10-CM | POA: Diagnosis not present

## 2017-02-08 DIAGNOSIS — Z87891 Personal history of nicotine dependence: Secondary | ICD-10-CM | POA: Insufficient documentation

## 2017-02-08 DIAGNOSIS — Z885 Allergy status to narcotic agent status: Secondary | ICD-10-CM | POA: Insufficient documentation

## 2017-02-08 DIAGNOSIS — E1140 Type 2 diabetes mellitus with diabetic neuropathy, unspecified: Secondary | ICD-10-CM | POA: Diagnosis not present

## 2017-02-08 DIAGNOSIS — I5032 Chronic diastolic (congestive) heart failure: Secondary | ICD-10-CM | POA: Insufficient documentation

## 2017-02-08 DIAGNOSIS — Z79899 Other long term (current) drug therapy: Secondary | ICD-10-CM | POA: Insufficient documentation

## 2017-02-08 DIAGNOSIS — Z7982 Long term (current) use of aspirin: Secondary | ICD-10-CM | POA: Insufficient documentation

## 2017-02-08 DIAGNOSIS — I10 Essential (primary) hypertension: Secondary | ICD-10-CM

## 2017-02-08 DIAGNOSIS — I509 Heart failure, unspecified: Secondary | ICD-10-CM | POA: Diagnosis present

## 2017-02-08 DIAGNOSIS — N186 End stage renal disease: Secondary | ICD-10-CM

## 2017-02-08 LAB — GLUCOSE, CAPILLARY: GLUCOSE-CAPILLARY: 115 mg/dL — AB (ref 65–99)

## 2017-02-08 MED ORDER — LOSARTAN POTASSIUM 25 MG PO TABS: 25.0000 mg | ORAL_TABLET | Freq: Every day | ORAL | 3 refills | Status: DC

## 2017-02-08 NOTE — Progress Notes (Signed)
Subjective:    Patient ID: Jasmine Holloway, female    DOB: 07/13/1961, 55 y.o.   MRN: [id number]  HPI   [name] is a 56 y/o female with a history of asthma, DM, HTN, kidney disease (dialysis), COPD, previous tobacco use and chronic heart failure.   Reviewed echo report on 11/10/16 which showed an EF of 50-55% along with mild AR/MR. EF was 55-60% May 2017. Cardiac catheterization was done 03/17/16 and showed normal coronary arteries.   Admitted 02/01/17 with pulmonary edema. Initially needed bipap. BP was elevated initially. Elevated troponin thought to be due to demand ischemia. Continued with dialysis. Discharged the following day. Was in the ED 11/14/16 with chest pain. Treated and released. Admitted 11/10/16 with acute pulmonary edema and COPD exacerbation. Treated and discharged the following day.   She hasn't been seen since July 2017 but she presents today for her follow-up visit with a chief complaint of pedal edema. She says this has been present for many years with waxing/waning of severity. She has associated light-headedness and weight gain along with this.   Past Medical History:  Diagnosis Date  . Asthma   . CHF (congestive heart failure) ([place])   . COPD (chronic obstructive pulmonary disease) ([place])   . Diabetes mellitus without complication ([place])   . Diastolic heart failure ([place])   . ESRD (end stage renal disease) on dialysis ([place])   . Hypertension   . Neuropathy   . Polycystic kidney disease   . Renal insufficiency     Past Surgical History:  Procedure Laterality Date  . ARTERIOVENOUS GRAFT PLACEMENT Right    x3 (R forearm currently used for access)  . BREAST BIOPSY Left 12/03/2015   neg  . COLON SURGERY    . ESOPHAGOGASTRODUODENOSCOPY (EGD) WITH PROPOFOL N/A 02/04/2017   Procedure: ESOPHAGOGASTRODUODENOSCOPY (EGD) WITH PROPOFOL;  Surgeon: Jonathon Bellows, MD;  Location: [facility];  Service: Endoscopy;  Laterality: N/A;  . PERIPHERAL VASCULAR CATHETERIZATION Right 08/04/2016    Procedure: A/V Shuntogram;  Surgeon: Algernon Huxley, MD;  Location: [facility];  Service: Cardiovascular;  Laterality: Right;  . VEIN HARVEST      Family History  Problem Relation Age of Onset  . Hypertension Brother   . Heart failure Brother   . Hypertension Mother   . Heart disease Mother     Social History  Substance Use Topics  . Smoking status: Former Smoker    Packs/day: 0.25    Quit date: 08/11/2015  . Smokeless tobacco: Never Used  . Alcohol use No    Allergies  Allergen Reactions  . Morphine And Related Nausea And Vomiting  . Strawberry Extract   . Tramadol Nausea And Vomiting  . Venofer [Iron Sucrose]   . Vicodin [Hydrocodone-Acetaminophen] Nausea And Vomiting  . Buprenorphine Hcl Nausea And Vomiting  . Codeine Nausea And Vomiting  . Morphine Nausea And Vomiting    Halllucinations    Prior to Admission medications   Medication Sig Start Date End Date Taking? Authorizing Provider  acetaminophen (TYLENOL) 325 MG tablet Take 2 tablets (650 mg total) by mouth every 6 (six) hours as needed for mild pain or headache. 03/18/16  Yes Gouru, Aruna, MD  amitriptyline (ELAVIL) 10 MG tablet  11/20/16  Yes [provider]  amLODipine (NORVASC) 5 MG tablet Take 5 mg by mouth daily. 08/31/14  Yes [provider]  aspirin 81 MG chewable tablet Chew 1 tablet (81 mg total) by mouth daily. 01/17/16  Yes Gladstone Lighter, MD  b complex-vitamin c-folic acid (NEPHRO-VITE) 0.8 MG TABS tablet Take 1 tablet by mouth at bedtime.   Yes [provider]  calcium acetate (PHOSLO) 667 MG capsule Take 1 capsule (667 mg total) by mouth 3 (three) times daily with meals. 02/02/17  Yes Wieting, Richard, MD  cloNIDine (CATAPRES) 0.1 MG tablet Take 1 tablet (0.1 mg total) by mouth 2 (two) times daily. 01/17/16  Yes Gladstone Lighter, MD  diphenhydrAMINE (BENADRYL) 25 mg capsule Take 2 capsules (50 mg total) by mouth every 6 (six) hours as needed. 10/16/16  Yes Carrie Mew, MD  furosemide (LASIX) 80 MG tablet Take 1 tablet (80 mg total) by mouth daily. 03/05/16 11/10/17 Yes Yosselyn Tax, Otila Kluver A, FNP  gabapentin (NEURONTIN) 300 MG capsule Take 300 mg by mouth 2 (two) times daily.    Yes [provider]  hydrALAZINE (APRESOLINE) 50 MG tablet Take 1 tablet (50 mg total) by mouth 3 (three) times daily. 02/02/17  Yes Wieting, Richard, MD  lidocaine (XYLOCAINE) 5 % ointment Apply topically.   Yes [provider]  LORazepam (ATIVAN) 1 MG tablet Take 1 tablet (1 mg total) by mouth 2 (two) times daily as needed for anxiety. 07/21/16  Yes Daymon Larsen, MD  losartan (COZAAR) 25 MG tablet Take 25 mg by mouth daily.   Yes [provider]  metolazone (ZAROXOLYN) 5 MG tablet Take 1 tablet (5 mg total) by mouth daily as needed. 06/08/16 11/10/17 Yes Gollan, Kathlene November, MD  metoprolol succinate (TOPROL-XL) 50 MG 24 hr tablet  01/22/17  Yes [provider]  metoprolol tartrate (LOPRESSOR) 50 MG tablet Take 50 mg by mouth 2 (two) times daily.   Yes [provider]  nitroGLYCERIN (NITROSTAT) 0.4 MG SL tablet Place 1 tablet (0.4 mg total) under the tongue every 5 (five) minutes as needed for chest pain. 01/14/17 04/14/17 Yes Gollan, Kathlene November, MD  omeprazole (PRILOSEC) 40 MG capsule Take by mouth daily.  12/20/16  Yes [provider]  ondansetron (ZOFRAN) 4 MG tablet Take 1 tablet (4 mg total) by mouth every 8 (eight) hours as needed for nausea or vomiting. 07/21/16  Yes Daymon Larsen, MD  oxyCODONE-acetaminophen (PERCOCET) 10-325 MG tablet Take 1 tablet by mouth daily as needed for pain.   Yes [provider]  prochlorperazine (COMPAZINE) 10 MG tablet Take 1 tablet (10 mg total) by mouth every 6 (six) hours as needed for nausea. 10/15/16  Yes Paulette Blanch, MD  SENSIPAR 30 MG tablet Take 1 tablet (30 mg total) by mouth daily. 07/07/16  Yes Fritzi Mandes, MD  VENTOLIN HFA 108 (90 Base) MCG/ACT inhaler Inhale 2 puffs into the lungs  daily as needed.  05/21/16  Yes [provider]   Review of Systems  Constitutional: Negative for appetite change and fatigue.  HENT: Positive for voice change (hoarseness getting better). Negative for congestion, sinus pressure and sore throat.   Eyes: Positive for visual disturbance (blurry vision). Negative for pain.  Respiratory: Negative for cough, chest tightness, shortness of breath and wheezing.   Cardiovascular: Positive for leg swelling.  Gastrointestinal: Negative for abdominal distention and abdominal pain.  Endocrine: Negative.   Genitourinary: Negative.   Musculoskeletal: Negative for back pain.  Skin: Negative.   Allergic/Immunologic: Negative.   Neurological: Positive for light-headedness, numbness (tingling down left arm at times ) and headaches. Negative for dizziness.  Hematological: Negative for adenopathy. Does not bruise/bleed easily.  Psychiatric/Behavioral: Negative for dysphoric mood and sleep disturbance (sleeping on 2 pillows). The  patient is not nervous/anxious.       Objective:   Physical Exam  Constitutional: She is oriented to person, place, and time. She appears well-developed and well-nourished.  HENT:  Head: Normocephalic and atraumatic.  Neck: Normal range of motion. Neck supple.  Cardiovascular: Normal rate and regular rhythm.   Pulmonary/Chest: Effort normal. She has no wheezes. She has no rales.  Abdominal: Soft. She exhibits no distension. There is no tenderness.  Musculoskeletal: She exhibits edema (2+ pitting edema in left lower leg; 1+ pitting in right lower leg.). She exhibits no tenderness.  Neurological: She is alert and oriented to person, place, and time.  Skin: Skin is warm and dry.  Psychiatric: She has a normal mood and affect. Her behavior is normal. Thought content normal.  Nursing note and vitals reviewed.   Vitals:   02/08/17 1107  BP: (!) 179/97  Pulse: 81  Resp: 20  SpO2: 97%  Weight: 191 lb (86.6 kg)  Height:  5\' 3"  (1.6 m)   Wt Readings from Last 3 Encounters:  02/08/17 191 lb (86.6 kg)  02/04/17 185 lb (83.9 kg)  02/02/17 185 lb 13.6 oz (84.3 kg)   Lab Results  Component Value Date   CREATININE 10.44 (H) 02/02/2017   CREATININE 12.06 (H) 02/01/2017   CREATININE 6.79 (H) 11/14/2016      Assessment & Plan:  1: Chronic heart failure with preserved ejection fraction-   - NYHA class II - mild fluid overloaded today - weighing daily; weight up 6 pounds since discharge and is due for dialysis tomorrow. Reminded to call for an overnight weight gain of >2 pounds or a weekly weight gain of >5 pounds - maintaining a 32 ounce fluid restriction per nephrology  - elevate legs when sitting for long periods of time - wearing oxygent at 2L PRN - saw cardiologist Rockey Situ) 01/14/17 - not adding salt but did eat ramen noodles today. Discussed the importance of following a 2000mg  sodium diet.   2: HTN-  - BP elevated today - has been out of losartan so it was refilled today - taking metolazone PRN for edema  3.ESRD on dialysis-  - dialysis on T, TH, Sat  4: Diabetes- - glucose checked today was 115 - currently diet controlled  Patient did not bring her medications nor a list. Each medication was verbally reviewed with the patient and she was encouraged to bring the bottles to every visit to confirm accuracy of list.  Return here in 1 month or sooner for any questions/problems before then.

## 2017-02-08 NOTE — Patient Instructions (Signed)
Continue weighing daily and call for an overnight weight gain of > 2 pounds or a weekly weight gain of >5 pounds.  Bring medication bottles to your next office visit.

## 2017-02-09 ENCOUNTER — Encounter: Payer: Self-pay | Admitting: Emergency Medicine

## 2017-02-09 ENCOUNTER — Emergency Department
Admission: EM | Admit: 2017-02-09 | Discharge: 2017-02-09 | Disposition: A | Discharge status: Home / Self Care | Payer: Medicare Other | Attending: Emergency Medicine | Admitting: Emergency Medicine

## 2017-02-09 DIAGNOSIS — E119 Type 2 diabetes mellitus without complications: Secondary | ICD-10-CM | POA: Insufficient documentation

## 2017-02-09 DIAGNOSIS — N186 End stage renal disease: Secondary | ICD-10-CM | POA: Insufficient documentation

## 2017-02-09 DIAGNOSIS — G43709 Chronic migraine without aura, not intractable, without status migrainosus: Secondary | ICD-10-CM | POA: Insufficient documentation

## 2017-02-09 DIAGNOSIS — I5030 Unspecified diastolic (congestive) heart failure: Secondary | ICD-10-CM | POA: Insufficient documentation

## 2017-02-09 DIAGNOSIS — Z87891 Personal history of nicotine dependence: Secondary | ICD-10-CM | POA: Diagnosis not present

## 2017-02-09 DIAGNOSIS — J449 Chronic obstructive pulmonary disease, unspecified: Secondary | ICD-10-CM | POA: Diagnosis not present

## 2017-02-09 DIAGNOSIS — Z7982 Long term (current) use of aspirin: Secondary | ICD-10-CM | POA: Insufficient documentation

## 2017-02-09 DIAGNOSIS — R51 Headache: Secondary | ICD-10-CM | POA: Diagnosis present

## 2017-02-09 DIAGNOSIS — I132 Hypertensive heart and chronic kidney disease with heart failure and with stage 5 chronic kidney disease, or end stage renal disease: Secondary | ICD-10-CM | POA: Diagnosis not present

## 2017-02-09 DIAGNOSIS — J45909 Unspecified asthma, uncomplicated: Secondary | ICD-10-CM | POA: Insufficient documentation

## 2017-02-09 DIAGNOSIS — Z79899 Other long term (current) drug therapy: Secondary | ICD-10-CM | POA: Diagnosis not present

## 2017-02-09 DIAGNOSIS — G43009 Migraine without aura, not intractable, without status migrainosus: Secondary | ICD-10-CM

## 2017-02-09 LAB — CBC
HEMATOCRIT: 36.8 % (ref 35.0–47.0)
Hemoglobin: 11.9 g/dL — ABNORMAL LOW (ref 12.0–16.0)
MCH: 28.9 pg (ref 26.0–34.0)
MCHC: 32.5 g/dL (ref 32.0–36.0)
MCV: 89 fL (ref 80.0–100.0)
PLATELETS: 275 10*3/uL (ref 150–440)
RBC: 4.13 MIL/uL (ref 3.80–5.20)
RDW: 20.5 % — AB (ref 11.5–14.5)
WBC: 8.7 10*3/uL (ref 3.6–11.0)

## 2017-02-09 LAB — COMPREHENSIVE METABOLIC PANEL
ALT: 11 U/L — ABNORMAL LOW (ref 14–54)
ANION GAP: 11 (ref 5–15)
AST: 20 U/L (ref 15–41)
Albumin: 4.1 g/dL (ref 3.5–5.0)
Alkaline Phosphatase: 66 U/L (ref 38–126)
BILIRUBIN TOTAL: 0.8 mg/dL (ref 0.3–1.2)
BUN: 37 mg/dL — ABNORMAL HIGH (ref 6–20)
CHLORIDE: 98 mmol/L — AB (ref 101–111)
CO2: 31 mmol/L (ref 22–32)
Calcium: 8.9 mg/dL (ref 8.9–10.3)
Creatinine, Ser: 7.12 mg/dL — ABNORMAL HIGH (ref 0.44–1.00)
GFR calc Af Amer: 7 mL/min — ABNORMAL LOW (ref >60)
GFR calc non Af Amer: 6 mL/min — ABNORMAL LOW (ref >60)
Glucose, Bld: 87 mg/dL (ref 65–99)
POTASSIUM: 4.5 mmol/L (ref 3.5–5.1)
Sodium: 140 mmol/L (ref 135–145)
TOTAL PROTEIN: 8.8 g/dL — AB (ref 6.5–8.1)

## 2017-02-09 MED ORDER — METOCLOPRAMIDE HCL 5 MG/ML IJ SOLN
20.0000 mg | Freq: Once | INTRAVENOUS | Status: AC
  Administered 2017-02-09: 20 mg via INTRAVENOUS
  Filled 2017-02-09: qty 4

## 2017-02-09 MED ORDER — HALOPERIDOL LACTATE 5 MG/ML IJ SOLN
2.0000 mg | Freq: Once | INTRAMUSCULAR | Status: AC
  Administered 2017-02-09: 2 mg via INTRAVENOUS
  Filled 2017-02-09: qty 1

## 2017-02-09 MED ORDER — DIPHENHYDRAMINE HCL 50 MG/ML IJ SOLN
25.0000 mg | Freq: Once | INTRAMUSCULAR | Status: AC
  Administered 2017-02-09: 25 mg via INTRAVENOUS
  Filled 2017-02-09: qty 1

## 2017-02-09 NOTE — ED Provider Notes (Signed)
[facility] Emergency Department Provider Note   ____________________________________________    I have reviewed the triage vital signs and the nursing notes.   HISTORY  Chief Complaint Headache     HPI Jasmine Holloway is a 56 y.o. female With history of diabetes COPD CHF end-stage renal disease as well as migraines who presents with a migraine headache. Patient reports global throbbing headache which is quite consistent with her typical migraine. She reports it always starts when she is getting dialysis which is what happened today. She had to cut dialysis off early because she developed a migraine. She reports light sensitivity. She reports her pain is severe and throbbing. She denies neuro deficits. She tells me that "the usual medications dont work on me". No fevers or chills reported   Past Medical History:  Diagnosis Date  . Asthma   . CHF (congestive heart failure) ([place])   . COPD (chronic obstructive pulmonary disease) ([place])   . Diabetes mellitus without complication ([place])   . Diastolic heart failure ([place])   . ESRD (end stage renal disease) on dialysis ([place])   . Hypertension   . Neuropathy   . Polycystic kidney disease   . Renal insufficiency     Patient Active Problem List   Diagnosis Date Noted  . Esophageal stricture   . Acute respiratory failure with hypoxia ([place]) 11/10/2016  . Hypoxia 11/10/2016  . Shortness of breath 10/26/2016  . COPD (chronic obstructive pulmonary disease) ([place]) 08/06/2016  . Hypertensive crisis 08/06/2016  . Acute pulmonary edema ([place]) 07/04/2016  . Acute respiratory failure ([place]) 06/09/2016  . Atypical chest pain 06/08/2016  . Chronic pain syndrome 06/08/2016  . Anemia 06/08/2016  . CHF exacerbation ([place]) 04/28/2016  . Epigastric pain 03/28/2016  . Acute CHF (congestive heart failure) ([place]) 03/28/2016  . CHF (congestive heart failure) ([place]) 03/28/2016  . Chronic diastolic heart failure ([place]) 02/04/2016  .  Hoarseness of voice 02/04/2016  . Nicotine dependence, uncomplicated 65/10/5463  . Acute on chronic diastolic CHF (congestive heart failure) ([place]) 12/22/2015  . ESRF (end stage renal failure) ([place]) 12/22/2015  . Essential hypertension 12/22/2015  . Mixed hyperlipidemia 12/22/2015  . Knee pain, left anterior 06/11/2015  . Arm pain, left 05/31/2015  . Pain of left arm 05/31/2015  . Closed bilateral ankle fractures, with routine healing, subsequent encounter 07/24/2014  . Closed bilateral ankle fractures, initial encounter 06/20/2014    Past Surgical History:  Procedure Laterality Date  . ARTERIOVENOUS GRAFT PLACEMENT Right    x3 (R forearm currently used for access)  . BREAST BIOPSY Left 12/03/2015   neg  . COLON SURGERY    . ESOPHAGOGASTRODUODENOSCOPY (EGD) WITH PROPOFOL N/A 02/04/2017   Procedure: ESOPHAGOGASTRODUODENOSCOPY (EGD) WITH PROPOFOL;  Surgeon: Jonathon Bellows, MD;  Location: [facility] ENDOSCOPY;  Service: Endoscopy;  Laterality: N/A;  . PERIPHERAL VASCULAR CATHETERIZATION Right 08/04/2016   Procedure: A/V Shuntogram;  Surgeon: Algernon Huxley, MD;  Location: [place] CV LAB;  Service: Cardiovascular;  Laterality: Right;  . VEIN HARVEST      Prior to Admission medications   Medication Sig Start Date End Date Taking? Authorizing Provider  amLODipine (NORVASC) 5 MG tablet Take 5 mg by mouth daily. 08/31/14  Yes [provider]  aspirin 81 MG chewable tablet Chew 1 tablet (81 mg total) by mouth daily. 01/17/16  Yes Gladstone Lighter, MD  b complex-vitamin c-folic acid (NEPHRO-VITE) 0.8 MG TABS tablet Take 1 tablet by mouth at bedtime.   Yes [provider]  calcium acetate (PHOSLO) 667 MG capsule Take 1 capsule (667 mg total) by mouth 3 (three) times daily with meals. 02/02/17  Yes Wieting, Richard, MD  cloNIDine (CATAPRES) 0.1 MG tablet Take 1 tablet (0.1 mg total) by mouth 2 (two) times daily. 01/17/16  Yes Gladstone Lighter, MD  furosemide (LASIX) 80 MG tablet Take 1  tablet (80 mg total) by mouth daily. 03/05/16 11/10/17 Yes Hackney, Otila Kluver A, FNP  gabapentin (NEURONTIN) 300 MG capsule Take 300 mg by mouth 2 (two) times daily.    Yes [provider]  losartan (COZAAR) 25 MG tablet Take 1 tablet (25 mg total) by mouth daily. 02/08/17  Yes [name] A, FNP  metoprolol succinate (TOPROL-XL) 50 MG 24 hr tablet Take 50 mg by mouth 2 (two) times daily.  01/22/17  Yes [provider]  omeprazole (PRILOSEC) 40 MG capsule Take by mouth daily.  12/20/16  Yes [provider]  rosuvastatin (CRESTOR) 5 MG tablet Take 5 mg by mouth daily.   Yes [provider]  acetaminophen (TYLENOL) 325 MG tablet Take 2 tablets (650 mg total) by mouth every 6 (six) hours as needed for mild pain or headache. 03/18/16   Gouru, Illene Silver, MD  diphenhydrAMINE (BENADRYL) 25 mg capsule Take 2 capsules (50 mg total) by mouth every 6 (six) hours as needed. 10/16/16   Carrie Mew, MD  hydrALAZINE (APRESOLINE) 50 MG tablet Take 1 tablet (50 mg total) by mouth 3 (three) times daily. Patient not taking: Reported on 02/09/2017 02/02/17   Loletha Grayer, MD  lidocaine (XYLOCAINE) 5 % ointment Apply topically.    [provider]  LORazepam (ATIVAN) 1 MG tablet Take 1 tablet (1 mg total) by mouth 2 (two) times daily as needed for anxiety. Patient not taking: Reported on 02/09/2017 07/21/16   Daymon Larsen, MD  metolazone (ZAROXOLYN) 5 MG tablet Take 1 tablet (5 mg total) by mouth daily as needed. Patient not taking: Reported on 02/09/2017 06/08/16 11/10/17  Minna Merritts, MD  nitroGLYCERIN (NITROSTAT) 0.4 MG SL tablet Place 1 tablet (0.4 mg total) under the tongue every 5 (five) minutes as needed for chest pain. 01/14/17 04/14/17  Minna Merritts, MD  ondansetron (ZOFRAN) 4 MG tablet Take 1 tablet (4 mg total) by mouth every 8 (eight) hours as needed for nausea or vomiting. Patient not taking: Reported on 02/09/2017 07/21/16   Daymon Larsen, MD  oxyCODONE-acetaminophen  (PERCOCET) 10-325 MG tablet Take 1 tablet by mouth daily as needed for pain.    [provider]  prochlorperazine (COMPAZINE) 10 MG tablet Take 1 tablet (10 mg total) by mouth every 6 (six) hours as needed for nausea. Patient not taking: Reported on 02/09/2017 10/15/16   Paulette Blanch, MD  SENSIPAR 30 MG tablet Take 1 tablet (30 mg total) by mouth daily. Patient not taking: Reported on 02/09/2017 07/07/16   Fritzi Mandes, MD  VENTOLIN HFA 108 [phone] Base) MCG/ACT inhaler Inhale 2 puffs into the lungs daily as needed.  05/21/16   [provider]     Allergies Morphine and related; Strawberry extract; Tramadol; Venofer [iron sucrose]; Vicodin [hydrocodone-acetaminophen]; Buprenorphine hcl; Codeine; and Morphine  Family History  Problem Relation Age of Onset  . Hypertension Brother   . Heart failure Brother   . Hypertension Mother   . Heart disease Mother     Social History Social History  Substance Use Topics  . Smoking status: Former Smoker    Packs/day: 0.25    Quit date: 08/11/2015  .  Smokeless tobacco: Never Used  . Alcohol use No    Review of Systems  Constitutional: No fever/chills Eyes: light sensitivity ENT: No neck pain Cardiovascular: Denies chest pain. Respiratory: Denies shortness of breath. Gastrointestinal: No nausea, no vomiting.   Genitourinary: Negative for dysuria. Musculoskeletal: Negative for back pain. Skin: Negative for rash. Neurological: no focal weakness   ____________________________________________   PHYSICAL EXAM:  VITAL SIGNS: ED Triage Vitals  Enc Vitals Group     BP 02/09/17 1056 (!) 189/119     Pulse Rate 02/09/17 1056 84     Resp 02/09/17 1056 17     Temp 02/09/17 1056 97.9 F (36.6 C)     Temp Source 02/09/17 1056 Oral     SpO2 02/09/17 1056 99 %     Weight 02/09/17 1057 82.6 kg (182 lb)     Height 02/09/17 1057 1.6 m (5\' 3" )     Head Circumference --      Peak Flow --      Pain Score 02/09/17 1056 10     Pain Loc --        Pain Edu? --      Excl. in [place]? --     Constitutional: Alert and oriented. No acute distress.anxious Eyes: Conjunctivae are normal. EOMI Head: Atraumatic. Nose: No congestion/rhinnorhea. Mouth/Throat: Mucous membranes are moist.   Neck:  Painless ROM Cardiovascular: Normal rate, regular rhythm.  Good peripheral circulation. Respiratory: Normal respiratory effort.  No retractions.  Gastrointestinal: Soft and nontender. No distention.  No CVA tenderness.  Musculoskeletal: No lower extremity tenderness nor edema.  Warm and well perfused Neurologic:  Normal speech and language. No gross focal neurologic deficits are appreciated.  Skin:  Skin is warm, dry and intact. No rash noted. Psychiatric: Mood and affect are normal. Speech and behavior are normal.  ____________________________________________   LABS (all labs ordered are listed, but only abnormal results are displayed)  Labs Reviewed  CBC - Abnormal; Notable for the following:       Result Value   Hemoglobin 11.9 (*)    RDW 20.5 (*)    All other components within normal limits  COMPREHENSIVE METABOLIC PANEL - Abnormal; Notable for the following:    Chloride 98 (*)    BUN 37 (*)    Creatinine, Ser 7.12 (*)    Total Protein 8.8 (*)    ALT 11 (*)    GFR calc non Af Amer 6 (*)    GFR calc Af Amer 7 (*)    All other components within normal limits   ____________________________________________  EKG  ED ECG REPORT I, Lavonia Drafts, the attending physician, personally viewed and interpreted this ECG.  Date: 02/09/2017 EKG Time: 10:56 AM Rate: 82 Rhythm: normal sinus rhythm QRS Axis: normal Intervals: normal ST/T Wave abnormalities: nonspecific Narrative Interpretation: nonspecific ST changes  ____________________________________________  RADIOLOGY  None ____________________________________________   PROCEDURES  Procedure(s) performed: No    Critical Care performed:  No ____________________________________________   INITIAL IMPRESSION / ASSESSMENT AND PLAN / ED COURSE  Pertinent labs & imaging results that were available during my care of the patient were reviewed by me and considered in my medical decision making (see chart for details).  Patient describes her symptoms as consistent with her typical migraine, we will trial Reglan and Benadryl. Review of records and this rates a the patient has required multiple medications in the past and has also had consistently elevated blood pressures despite IV medications trialed  Patient had significant relief after  IV medications. She is asking to be discharged. Recommended treatment of her high blood pressure but she states "it is always high "I do not want anything done let me go"    ____________________________________________   FINAL CLINICAL IMPRESSION(S) / ED DIAGNOSES  Final diagnoses:  Migraine without aura and without status migrainosus, not intractable      NEW MEDICATIONS STARTED DURING THIS VISIT:  Discharge Medication List as of 02/09/2017  1:48 PM       Note:  This document was prepared using Dragon voice recognition software and may include unintentional dictation errors.    Lavonia Drafts, MD 02/09/17 [phone]

## 2017-02-09 NOTE — ED Triage Notes (Signed)
Patient presents to ED via [facility] from home. Patient had dialysis today, where they took 2kg off of patient. Patient has an appointment tomorrow to get another 2kg taken off. Patient reports she gets terrible headaches post dialysis treatment. Patient moaning out in pain.

## 2017-02-09 NOTE — ED Notes (Signed)
EMS gave 1g of PO tylenol.

## 2017-02-09 NOTE — ED Notes (Signed)
ED Medic, Les, attempted IV access x2. Unsuccessful. This RN attempted IV access x1. Unsuccessful. Larene Beach, RN at bedside to attempt IV ultrasound.

## 2017-02-09 NOTE — ED Notes (Signed)
PIV infiltrated, pt very difficulty stick.  Will have [name] certified RN attempt another PIV.

## 2017-02-09 NOTE — ED Notes (Signed)
Pt wears 2L nasal cannula at home, pt does not have portable O2 with her, states, "I will be fine without it" and does not wish to have sister go pick up from her house.  Pt 94% on room air at this time.

## 2017-02-10 DIAGNOSIS — E119 Type 2 diabetes mellitus without complications: Secondary | ICD-10-CM | POA: Insufficient documentation

## 2017-02-17 ENCOUNTER — Ambulatory Visit: Admission: RE | Admit: 2017-02-17 | Discharge: 2017-02-17 | Disposition: A | Discharge status: Home / Self Care

## 2017-02-17 ENCOUNTER — Ambulatory Visit
Admission: RE | Admit: 2017-02-17 | Discharge: 2017-02-17 | Disposition: A | Discharge status: Home / Self Care | Payer: MEDICARE

## 2017-02-17 DIAGNOSIS — M792 Neuralgia and neuritis, unspecified: Secondary | ICD-10-CM

## 2017-02-17 DIAGNOSIS — G894 Chronic pain syndrome: Principal | ICD-10-CM

## 2017-02-17 DIAGNOSIS — Z01818 Encounter for other preprocedural examination: Principal | ICD-10-CM

## 2017-02-17 DIAGNOSIS — Z7682 Awaiting organ transplant status: Secondary | ICD-10-CM

## 2017-02-17 DIAGNOSIS — N186 End stage renal disease: Secondary | ICD-10-CM

## 2017-02-17 MED ORDER — OXYCODONE-ACETAMINOPHEN 10 MG-325 MG TABLET
ORAL_TABLET | Freq: Every day | ORAL | 0 refills | 0 days | Status: CP | PRN
Start: 2017-02-17 — End: 2017-05-26

## 2017-02-24 ENCOUNTER — Emergency Department: Payer: Medicare Other

## 2017-02-24 ENCOUNTER — Emergency Department
Admission: EM | Admit: 2017-02-24 | Discharge: 2017-02-25 | Disposition: A | Discharge status: Home / Self Care | Payer: Medicare Other | Attending: Emergency Medicine | Admitting: Emergency Medicine

## 2017-02-24 DIAGNOSIS — R0602 Shortness of breath: Secondary | ICD-10-CM | POA: Insufficient documentation

## 2017-02-24 DIAGNOSIS — R531 Weakness: Secondary | ICD-10-CM | POA: Diagnosis not present

## 2017-02-24 DIAGNOSIS — J45909 Unspecified asthma, uncomplicated: Secondary | ICD-10-CM | POA: Insufficient documentation

## 2017-02-24 DIAGNOSIS — R609 Edema, unspecified: Secondary | ICD-10-CM | POA: Diagnosis not present

## 2017-02-24 DIAGNOSIS — Z79899 Other long term (current) drug therapy: Secondary | ICD-10-CM | POA: Insufficient documentation

## 2017-02-24 DIAGNOSIS — I132 Hypertensive heart and chronic kidney disease with heart failure and with stage 5 chronic kidney disease, or end stage renal disease: Secondary | ICD-10-CM | POA: Diagnosis not present

## 2017-02-24 DIAGNOSIS — I5032 Chronic diastolic (congestive) heart failure: Secondary | ICD-10-CM | POA: Insufficient documentation

## 2017-02-24 DIAGNOSIS — Z87891 Personal history of nicotine dependence: Secondary | ICD-10-CM | POA: Diagnosis not present

## 2017-02-24 DIAGNOSIS — E1122 Type 2 diabetes mellitus with diabetic chronic kidney disease: Secondary | ICD-10-CM | POA: Diagnosis not present

## 2017-02-24 DIAGNOSIS — N186 End stage renal disease: Secondary | ICD-10-CM | POA: Diagnosis not present

## 2017-02-24 DIAGNOSIS — J449 Chronic obstructive pulmonary disease, unspecified: Secondary | ICD-10-CM | POA: Diagnosis not present

## 2017-02-24 DIAGNOSIS — Z992 Dependence on renal dialysis: Secondary | ICD-10-CM | POA: Insufficient documentation

## 2017-02-24 LAB — COMPREHENSIVE METABOLIC PANEL
ALBUMIN: 3.7 g/dL (ref 3.5–5.0)
ALK PHOS: 60 U/L (ref 38–126)
ALT: 22 U/L (ref 14–54)
AST: 35 U/L (ref 15–41)
Anion gap: 14 (ref 5–15)
BILIRUBIN TOTAL: 0.7 mg/dL (ref 0.3–1.2)
BUN: 53 mg/dL — ABNORMAL HIGH (ref 6–20)
CALCIUM: 7.8 mg/dL — AB (ref 8.9–10.3)
CO2: 29 mmol/L (ref 22–32)
Chloride: 100 mmol/L — ABNORMAL LOW (ref 101–111)
Creatinine, Ser: 11.3 mg/dL — ABNORMAL HIGH (ref 0.44–1.00)
GFR calc Af Amer: 4 mL/min — ABNORMAL LOW (ref >60)
GFR calc non Af Amer: 3 mL/min — ABNORMAL LOW (ref >60)
GLUCOSE: 108 mg/dL — AB (ref 65–99)
Potassium: 5.5 mmol/L — ABNORMAL HIGH (ref 3.5–5.1)
Sodium: 143 mmol/L (ref 135–145)
TOTAL PROTEIN: 7.8 g/dL (ref 6.5–8.1)

## 2017-02-24 LAB — CBC
HEMATOCRIT: 31.3 % — AB (ref 35.0–47.0)
HEMOGLOBIN: 10.3 g/dL — AB (ref 12.0–16.0)
MCH: 28.8 pg (ref 26.0–34.0)
MCHC: 32.7 g/dL (ref 32.0–36.0)
MCV: 88.1 fL (ref 80.0–100.0)
Platelets: 265 10*3/uL (ref 150–440)
RBC: 3.56 MIL/uL — AB (ref 3.80–5.20)
RDW: 20.8 % — ABNORMAL HIGH (ref 11.5–14.5)
WBC: 6.6 10*3/uL (ref 3.6–11.0)

## 2017-02-24 LAB — TROPONIN I: Troponin I: 0.03 ng/mL (ref <0.03)

## 2017-02-24 NOTE — ED Triage Notes (Signed)
Per EMS, pt reports increased SHOB that began this am. Pt states SHOB is worse with exertion. Pt states hx of CHF, COPD and is currently on dialysis. Pt states yest she was at dialysis and had to come off after 2 hrs due to legs cramping, pt states she normally does dialysis for 3 hrs and 65mins. Pt also reports increased pedal edema. EMS reports pt was hypertensive on arrival at 200/110 and was given 2 nitros which brought BP to 182/100. Pt on chronic 2L of oxygen. Pt talking and is A&O at this time.

## 2017-02-24 NOTE — ED Notes (Signed)
Pt states hx of intubation "3 times."

## 2017-02-24 NOTE — ED Provider Notes (Signed)
Clinical Course as of Feb 25 229  Wed Feb 24, 2017  2335 Assuming care from Dr. Kerman Passey.  In short, Jasmine Holloway is a 56 y.o. female with a chief complaint of SOB.  Refer to the original H&P for additional details.  The current plan of care is to check lab results and reassess.  Anticipate discharge for dialysis in the AM.   [CF]  2356 Slightly elevated troponin is the patient's baseline; looking back at all her prior labs her troponin is always 0.030 0.04.  I checked on her and she is asleep, breathing comfortably with no hypoxemia.  I updated her about the reassuring results and she was comfortable with that.  She is in no distress right now and denies any shortness of breath.  She said she would be comfortable following up in the morning for dialysis as planned.  Given the reassuring workup and the patient's lack of symptoms at this time, I opted not to call Dr. Holley Raring since it is 2:00 in the morning and rather the patient will follow up as scheduled for dialysis in a few hours. Troponin I: (!!) 0.03 [CF]    Clinical Course User Index [CF] Hinda Kehr, MD      Hinda Kehr, MD 02/25/17 0230

## 2017-02-24 NOTE — ED Provider Notes (Signed)
[facility] Emergency Department Provider Note  Time seen: 10:39 PM  I have reviewed the triage vital signs and the nursing notes.   HISTORY  Chief Complaint Shortness of Breath    HPI Jasmine Holloway is a 56 y.o. female with a past medical history of asthma, CHF, COPD, diabetes, end-stage renal disease on hemodialysis Tuesday, Thursday, Saturday, presents to the emergency Department for shortness of breath and lower extremity edema. According to the patient she was expressing leg cramping during her dialysis session yesterday. She states she only did 2 hours of dialysis and sent for normal 3-1/2. She states they also gave her fluid back and she believes they gave her back 2 months. She states today she has been extremely short of breath worse with exertion and is experiencing lower extremity edema which she states is new for the patient. Patient denies any chest pain. She does state nausea and weakness.  Past Medical History:  Diagnosis Date  . Asthma   . CHF (congestive heart failure) ([place])   . COPD (chronic obstructive pulmonary disease) ([place])   . Diabetes mellitus without complication ([place])   . Diastolic heart failure ([place])   . ESRD (end stage renal disease) on dialysis ([place])   . Hypertension   . Neuropathy   . Polycystic kidney disease   . Renal insufficiency     Patient Active Problem List   Diagnosis Date Noted  . Diabetes ([place]) 02/10/2017  . Esophageal stricture   . Hypoxia 11/10/2016  . Shortness of breath 10/26/2016  . COPD (chronic obstructive pulmonary disease) ([place]) 08/06/2016  . Hypertensive crisis 08/06/2016  . Atypical chest pain 06/08/2016  . Chronic pain syndrome 06/08/2016  . Anemia 06/08/2016  . CHF exacerbation ([place]) 04/28/2016  . Epigastric pain 03/28/2016  . Chronic diastolic heart failure ([place]) 02/04/2016  . Hoarseness of voice 02/04/2016  . Nicotine dependence, uncomplicated 74/07/8785  . ESRF (end stage renal failure) ([place])  12/22/2015  . Essential hypertension 12/22/2015  . Mixed hyperlipidemia 12/22/2015  . Knee pain, left anterior 06/11/2015  . Arm pain, left 05/31/2015  . Pain of left arm 05/31/2015  . Closed bilateral ankle fractures, with routine healing, subsequent encounter 07/24/2014  . Closed bilateral ankle fractures, initial encounter 06/20/2014    Past Surgical History:  Procedure Laterality Date  . ARTERIOVENOUS GRAFT PLACEMENT Right    x3 (R forearm currently used for access)  . BREAST BIOPSY Left 12/03/2015   neg  . COLON SURGERY    . ESOPHAGOGASTRODUODENOSCOPY (EGD) WITH PROPOFOL N/A 02/04/2017   Procedure: ESOPHAGOGASTRODUODENOSCOPY (EGD) WITH PROPOFOL;  Surgeon: Jonathon Bellows, MD;  Location: [facility] ENDOSCOPY;  Service: Endoscopy;  Laterality: N/A;  . PERIPHERAL VASCULAR CATHETERIZATION Right 08/04/2016   Procedure: A/V Shuntogram;  Surgeon: Algernon Huxley, MD;  Location: [place] CV LAB;  Service: Cardiovascular;  Laterality: Right;  . VEIN HARVEST      Prior to Admission medications   Medication Sig Start Date End Date Taking? Authorizing Provider  acetaminophen (TYLENOL) 325 MG tablet Take 2 tablets (650 mg total) by mouth every 6 (six) hours as needed for mild pain or headache. 03/18/16   Gouru, Illene Silver, MD  amLODipine (NORVASC) 5 MG tablet Take 5 mg by mouth daily. 08/31/14   [provider]  aspirin 81 MG chewable tablet Chew 1 tablet (81 mg total) by mouth daily. 01/17/16   Gladstone Lighter, MD  b complex-vitamin c-folic acid (NEPHRO-VITE) 0.8 MG TABS tablet Take 1 tablet by mouth at bedtime.  [provider]  calcium acetate (PHOSLO) 667 MG capsule Take 1 capsule (667 mg total) by mouth 3 (three) times daily with meals. 02/02/17   Loletha Grayer, MD  cloNIDine (CATAPRES) 0.1 MG tablet Take 1 tablet (0.1 mg total) by mouth 2 (two) times daily. 01/17/16   Gladstone Lighter, MD  diphenhydrAMINE (BENADRYL) 25 mg capsule Take 2 capsules (50 mg total) by mouth every 6 (six)  hours as needed. 10/16/16   Carrie Mew, MD  furosemide (LASIX) 80 MG tablet Take 1 tablet (80 mg total) by mouth daily. 03/05/16 11/10/17  Alisa Graff, FNP  gabapentin (NEURONTIN) 300 MG capsule Take 300 mg by mouth 2 (two) times daily.     [provider]  hydrALAZINE (APRESOLINE) 50 MG tablet Take 1 tablet (50 mg total) by mouth 3 (three) times daily. Patient not taking: Reported on 02/09/2017 02/02/17   Loletha Grayer, MD  lidocaine (XYLOCAINE) 5 % ointment Apply topically.    [provider]  LORazepam (ATIVAN) 1 MG tablet Take 1 tablet (1 mg total) by mouth 2 (two) times daily as needed for anxiety. Patient not taking: Reported on 02/09/2017 07/21/16   Daymon Larsen, MD  losartan (COZAAR) 25 MG tablet Take 1 tablet (25 mg total) by mouth daily. 02/08/17   Alisa Graff, FNP  metolazone (ZAROXOLYN) 5 MG tablet Take 1 tablet (5 mg total) by mouth daily as needed. Patient not taking: Reported on 02/09/2017 06/08/16 11/10/17  Minna Merritts, MD  metoprolol succinate (TOPROL-XL) 50 MG 24 hr tablet Take 50 mg by mouth 2 (two) times daily.  01/22/17   [provider]  nitroGLYCERIN (NITROSTAT) 0.4 MG SL tablet Place 1 tablet (0.4 mg total) under the tongue every 5 (five) minutes as needed for chest pain. 01/14/17 04/14/17  Minna Merritts, MD  omeprazole (PRILOSEC) 40 MG capsule Take by mouth daily.  12/20/16   [provider]  ondansetron (ZOFRAN) 4 MG tablet Take 1 tablet (4 mg total) by mouth every 8 (eight) hours as needed for nausea or vomiting. Patient not taking: Reported on 02/09/2017 07/21/16   Daymon Larsen, MD  oxyCODONE-acetaminophen (PERCOCET) 10-325 MG tablet Take 1 tablet by mouth daily as needed for pain.    [provider]  prochlorperazine (COMPAZINE) 10 MG tablet Take 1 tablet (10 mg total) by mouth every 6 (six) hours as needed for nausea. Patient not taking: Reported on 02/09/2017 10/15/16   Paulette Blanch, MD  rosuvastatin (CRESTOR) 5 MG  tablet Take 5 mg by mouth daily.    [provider]  SENSIPAR 30 MG tablet Take 1 tablet (30 mg total) by mouth daily. Patient not taking: Reported on 02/09/2017 07/07/16   Fritzi Mandes, MD  VENTOLIN HFA 108 [phone] Base) MCG/ACT inhaler Inhale 2 puffs into the lungs daily as needed.  05/21/16   [provider]    Allergies  Allergen Reactions  . Morphine And Related Nausea And Vomiting  . Strawberry Extract   . Tramadol Nausea And Vomiting  . Venofer [Iron Sucrose]   . Vicodin [Hydrocodone-Acetaminophen] Nausea And Vomiting  . Buprenorphine Hcl Nausea And Vomiting  . Codeine Nausea And Vomiting  . Morphine Nausea And Vomiting    Halllucinations    Family History  Problem Relation Age of Onset  . Hypertension Brother   . Heart failure Brother   . Hypertension Mother   . Heart disease Mother     Social History Social History  Substance Use Topics  .  Smoking status: Former Smoker    Packs/day: 0.25    Quit date: 08/11/2015  . Smokeless tobacco: Never Used  . Alcohol use No    Review of Systems Constitutional: Negative for fever. Cardiovascular: Negative for chest pain. Respiratory: Positive shortness of breath Gastrointestinal: Negative for abdominal pain Musculoskeletal: Lower extremity edema Neurological: Negative for headache All other ROS negative  ____________________________________________   PHYSICAL EXAM:  Constitutional: Alert and oriented. Well appearing and in no distress. Eyes: Normal exam ENT   Head: Normocephalic and atraumatic.   Mouth/Throat: Mucous membranes are moist. Cardiovascular: Normal rate, regular rhythm. No murmur Respiratory: Mild tachypnea, slight rales. Gastrointestinal: Soft and nontender. No distention.   Musculoskeletal: Good range of motion in all extremities. Patient does have 2+ lower extremity edema equal bilaterally. Pitting. Neurologic:  Normal speech and language. No gross focal neurologic deficits  Skin:   Skin is warm, dry and intact.  Psychiatric: Mood and affect are normal.   ____________________________________________    EKG  EKG reviewed and interpreted by myself shows normal sinus rhythm at 66 bpm, narrow QRS, normal axis, normal intervals with nonskid ST changes ST elevation.  ____________________________________________    RADIOLOGY  IMPRESSION: Cardiomegaly with vascular congestion. No focal consolidation. Overall improved aeration of the lungs since the prior radiograph.  ____________________________________________   INITIAL IMPRESSION / ASSESSMENT AND PLAN / ED COURSE  Pertinent labs & imaging results that were available during my care of the patient were reviewed by me and considered in my medical decision making (see chart for details).  Patient presents to the emergency Department for shortness of breath increased lower extremity edema and generalized fatigue. Patient normally gets dialysis Tuesday, Thursday, Saturday, only received A treatment yesterday and they give her back a liter of fluid per patient. Patient short of breath with lower extremity edema we'll obtain labs, chest x-ray and closely monitor. Depending on the labs and x-ray results will likely discuss with Dr. Holley Raring, her nephrologist. Overall the patient appears well, no respiratory distress, lying in bed comfortably.  ____________________________________________   FINAL CLINICAL IMPRESSION(S) / ED DIAGNOSES  Peripheral edema Dyspnea Weakness    Harvest Dark, MD 02/25/17 2319

## 2017-02-24 NOTE — ED Notes (Signed)
Patient transported to X-ray 

## 2017-02-25 NOTE — ED Notes (Signed)

## 2017-02-25 NOTE — ED Notes (Addendum)
Pt and this RN talked with transporter who is coming to pick pt up at this time from ED. Pt wheeled in wheelchair to lobby, first RN notified of this.

## 2017-02-25 NOTE — ED Notes (Signed)
Pt talking with significant other who is at their home at this time. Pt's significant other does not drive. Pt told SO to tell the transport driver to meet pt here at ED entrance to take her to dialysis. (Pt has transport service that picks her up from home and takes her to dialysis). At this time, pt is waiting to hear back from SO.

## 2017-02-25 NOTE — ED Notes (Addendum)
Pt resting on stretcher with lights turned out to enhance rest. No distress noted. O2 in place via nasal canula. side rails up for safety. Will continue to assess.

## 2017-02-25 NOTE — Discharge Instructions (Signed)
As we discussed, your workup today was reassuring.  It appears that you have no emergent medical condition at this time and that you are safe to go home and follow up as recommended in this paperwork.  **Please go to dialysis in the morning as scheduled because this will help your symptoms more than anything else.**  Please return immediately to the Emergency Department if you develop any new or worsening symptoms that concern you.

## 2017-03-08 ENCOUNTER — Ambulatory Visit: Payer: Medicare Other | Attending: Family | Admitting: Family

## 2017-03-08 ENCOUNTER — Encounter: Payer: Self-pay | Admitting: Family

## 2017-03-08 VITALS — BP 196/98 | HR 71 | Resp 20 | Ht 63.0 in | Wt 188.2 lb

## 2017-03-08 DIAGNOSIS — I10 Essential (primary) hypertension: Secondary | ICD-10-CM

## 2017-03-08 DIAGNOSIS — I132 Hypertensive heart and chronic kidney disease with heart failure and with stage 5 chronic kidney disease, or end stage renal disease: Secondary | ICD-10-CM | POA: Insufficient documentation

## 2017-03-08 DIAGNOSIS — Z87891 Personal history of nicotine dependence: Secondary | ICD-10-CM | POA: Diagnosis not present

## 2017-03-08 DIAGNOSIS — N186 End stage renal disease: Secondary | ICD-10-CM | POA: Diagnosis not present

## 2017-03-08 DIAGNOSIS — Z8249 Family history of ischemic heart disease and other diseases of the circulatory system: Secondary | ICD-10-CM | POA: Diagnosis not present

## 2017-03-08 DIAGNOSIS — E1140 Type 2 diabetes mellitus with diabetic neuropathy, unspecified: Secondary | ICD-10-CM | POA: Diagnosis not present

## 2017-03-08 DIAGNOSIS — J449 Chronic obstructive pulmonary disease, unspecified: Secondary | ICD-10-CM | POA: Insufficient documentation

## 2017-03-08 DIAGNOSIS — I5032 Chronic diastolic (congestive) heart failure: Secondary | ICD-10-CM | POA: Insufficient documentation

## 2017-03-08 DIAGNOSIS — Z992 Dependence on renal dialysis: Secondary | ICD-10-CM | POA: Insufficient documentation

## 2017-03-08 DIAGNOSIS — Z888 Allergy status to other drugs, medicaments and biological substances status: Secondary | ICD-10-CM | POA: Diagnosis not present

## 2017-03-08 DIAGNOSIS — Z79899 Other long term (current) drug therapy: Secondary | ICD-10-CM | POA: Diagnosis not present

## 2017-03-08 DIAGNOSIS — Z7982 Long term (current) use of aspirin: Secondary | ICD-10-CM | POA: Diagnosis not present

## 2017-03-08 DIAGNOSIS — E1122 Type 2 diabetes mellitus with diabetic chronic kidney disease: Secondary | ICD-10-CM | POA: Diagnosis not present

## 2017-03-08 DIAGNOSIS — Q613 Polycystic kidney, unspecified: Secondary | ICD-10-CM | POA: Diagnosis not present

## 2017-03-08 DIAGNOSIS — Z885 Allergy status to narcotic agent status: Secondary | ICD-10-CM | POA: Insufficient documentation

## 2017-03-08 DIAGNOSIS — I509 Heart failure, unspecified: Secondary | ICD-10-CM | POA: Diagnosis present

## 2017-03-08 NOTE — Patient Instructions (Addendum)
Continue weighing daily and call for an overnight weight gain of > 2 pounds or a weekly weight gain of >5 pounds. 

## 2017-03-08 NOTE — Progress Notes (Signed)
Subjective:    Patient ID: Jasmine Holloway, female    DOB: 15-Aug-1960, 56 y.o.   MRN: [id number]  HPI   Ms Venning is a 56 y/o female with a history of asthma, DM, HTN, kidney disease (dialysis), COPD, previous tobacco use and chronic heart failure.   Reviewed echo report on 11/10/16 which showed an EF of 50-55% along with mild AR/MR. EF was 55-60% May 2017. Cardiac catheterization was done 03/17/16 and showed normal coronary arteries.   Admitted 02/01/17 with pulmonary edema. Initially needed bipap. BP was elevated initially. Elevated troponin thought to be due to demand ischemia. Continued with dialysis. Discharged the following day. Was in the ED 11/14/16 with chest pain. Treated and released. Admitted 11/10/16 with acute pulmonary edema and COPD exacerbation. Treated and discharged the following day.   She presents today for a follow-up visit with a chief complaint of mild fatigue upon moderate exertion. She describes this as chronic in nature and having been present for several years with varying levels of severity. She reports sleeping well on 2 pillows. She has associated shortness of breath and edema along with this.   Past Medical History:  Diagnosis Date  . Asthma   . CHF (congestive heart failure) ([place])   . COPD (chronic obstructive pulmonary disease) ([place])   . Diabetes mellitus without complication ([place])   . Diastolic heart failure ([place])   . ESRD (end stage renal disease) on dialysis ([place])   . Hypertension   . Neuropathy   . Polycystic kidney disease   . Renal insufficiency     Past Surgical History:  Procedure Laterality Date  . ARTERIOVENOUS GRAFT PLACEMENT Right    x3 (R forearm currently used for access)  . BREAST BIOPSY Left 12/03/2015   neg  . COLON SURGERY    . ESOPHAGOGASTRODUODENOSCOPY (EGD) WITH PROPOFOL N/A 02/04/2017   Procedure: ESOPHAGOGASTRODUODENOSCOPY (EGD) WITH PROPOFOL;  Surgeon: Jonathon Bellows, MD;  Location: [facility] ENDOSCOPY;  Service: Endoscopy;  Laterality: N/A;   . PERIPHERAL VASCULAR CATHETERIZATION Right 08/04/2016   Procedure: A/V Shuntogram;  Surgeon: Algernon Huxley, MD;  Location: [facility];  Service: Cardiovascular;  Laterality: Right;  . VEIN HARVEST      Family History  Problem Relation Age of Onset  . Hypertension Brother   . Heart failure Brother   . Hypertension Mother   . Heart disease Mother     Social History  Substance Use Topics  . Smoking status: Former Smoker    Packs/day: 0.25    Quit date: 08/11/2015  . Smokeless tobacco: Never Used  . Alcohol use No    Allergies  Allergen Reactions  . Morphine And Related Nausea And Vomiting  . Strawberry Extract   . Tramadol Nausea And Vomiting  . Venofer [Iron Sucrose]   . Vicodin [Hydrocodone-Acetaminophen] Nausea And Vomiting  . Buprenorphine Hcl Nausea And Vomiting  . Codeine Nausea And Vomiting  . Morphine Nausea And Vomiting    Halllucinations    Prior to Admission medications   Medication Sig Start Date End Date Taking? Authorizing Provider  acetaminophen (TYLENOL) 325 MG tablet Take 2 tablets (650 mg total) by mouth every 6 (six) hours as needed for mild pain or headache. 03/18/16  Yes Gouru, Aruna, MD  amitriptyline (ELAVIL) 10 MG tablet  11/20/16  Yes [provider]  amLODipine (NORVASC) 5 MG tablet Take 5 mg by mouth daily. 08/31/14  Yes [provider]  aspirin 81 MG chewable tablet Chew 1 tablet (81 mg total)  by mouth daily. 01/17/16  Yes Gladstone Lighter, MD  b complex-vitamin c-folic acid (NEPHRO-VITE) 0.8 MG TABS tablet Take 1 tablet by mouth at bedtime.   Yes [provider]  calcium acetate (PHOSLO) 667 MG capsule Take 1 capsule (667 mg total) by mouth 3 (three) times daily with meals. 02/02/17  Yes Wieting, Richard, MD  cloNIDine (CATAPRES) 0.1 MG tablet Take 1 tablet (0.1 mg total) by mouth 2 (two) times daily. 01/17/16  Yes Gladstone Lighter, MD  diphenhydrAMINE (BENADRYL) 25 mg capsule Take 2 capsules (50 mg total) by mouth  every 6 (six) hours as needed. 10/16/16  Yes Carrie Mew, MD  furosemide (LASIX) 80 MG tablet Take 1 tablet (80 mg total) by mouth daily. 03/05/16 11/10/17 Yes Hackney, Otila Kluver A, FNP  gabapentin (NEURONTIN) 300 MG capsule Take 300 mg by mouth 2 (two) times daily.    Yes [provider]  hydrALAZINE (APRESOLINE) 50 MG tablet Take 1 tablet (50 mg total) by mouth 3 (three) times daily. 02/02/17  Yes Wieting, Richard, MD  lidocaine (XYLOCAINE) 5 % ointment Apply topically.   Yes [provider]  LORazepam (ATIVAN) 1 MG tablet Take 1 tablet (1 mg total) by mouth 2 (two) times daily as needed for anxiety. 07/21/16  Yes Daymon Larsen, MD  losartan (COZAAR) 25 MG tablet Take 25 mg by mouth daily.   Yes [provider]  metolazone (ZAROXOLYN) 5 MG tablet Take 1 tablet (5 mg total) by mouth daily as needed. 06/08/16 11/10/17 Yes Gollan, Kathlene November, MD  metoprolol succinate (TOPROL-XL) 50 MG 24 hr tablet  01/22/17  Yes [provider]  metoprolol tartrate (LOPRESSOR) 50 MG tablet Take 50 mg by mouth 2 (two) times daily.   Yes [provider]  nitroGLYCERIN (NITROSTAT) 0.4 MG SL tablet Place 1 tablet (0.4 mg total) under the tongue every 5 (five) minutes as needed for chest pain. 01/14/17 04/14/17 Yes Gollan, Kathlene November, MD  omeprazole (PRILOSEC) 40 MG capsule Take by mouth daily.  12/20/16  Yes [provider]  ondansetron (ZOFRAN) 4 MG tablet Take 1 tablet (4 mg total) by mouth every 8 (eight) hours as needed for nausea or vomiting. 07/21/16  Yes Daymon Larsen, MD  oxyCODONE-acetaminophen (PERCOCET) 10-325 MG tablet Take 1 tablet by mouth daily as needed for pain.   Yes [provider]  prochlorperazine (COMPAZINE) 10 MG tablet Take 1 tablet (10 mg total) by mouth every 6 (six) hours as needed for nausea. 10/15/16  Yes Paulette Blanch, MD  SENSIPAR 30 MG tablet Take 1 tablet (30 mg total) by mouth daily. 07/07/16  Yes Fritzi Mandes, MD  VENTOLIN HFA 108 (90  Base) MCG/ACT inhaler Inhale 2 puffs into the lungs daily as needed.  05/21/16  Yes [provider]   Review of Systems  Constitutional: Positive for fatigue. Negative for appetite change.  HENT: Positive for voice change (hoarseness getting better). Negative for congestion and sinus pressure.   Eyes: Positive for visual disturbance (blurry vision). Negative for pain.  Respiratory: Positive for shortness of breath. Negative for cough and chest tightness.   Cardiovascular: Positive for leg swelling. Negative for chest pain and palpitations.  Gastrointestinal: Negative for abdominal distention and abdominal pain.  Endocrine: Negative.   Genitourinary: Negative.   Musculoskeletal: Negative for back pain and neck pain.  Skin: Negative.   Allergic/Immunologic: Negative.   Neurological: Positive for light-headedness and numbness (tingling down left arm at times ). Negative for dizziness.  Hematological: Negative for adenopathy.  Does not bruise/bleed easily.  Psychiatric/Behavioral: Negative for dysphoric mood and sleep disturbance (sleeping on 2 pillows). The patient is not nervous/anxious.       Objective:   Physical Exam  Constitutional: She is oriented to person, place, and time. She appears well-developed and well-nourished.  HENT:  Head: Normocephalic and atraumatic.  Neck: Normal range of motion. Neck supple.  Cardiovascular: Normal rate and regular rhythm.   Pulmonary/Chest: Effort normal. She has no wheezes. She has no rales.  Abdominal: Soft. She exhibits no distension. There is no tenderness.  Musculoskeletal: She exhibits edema (1+ pitting edema in left lower leg; trace pitting in right lower leg.). She exhibits no tenderness.  Neurological: She is alert and oriented to person, place, and time.  Skin: Skin is warm and dry.  Psychiatric: She has a normal mood and affect. Her behavior is normal. Thought content normal.  Nursing note and vitals reviewed.   Vitals:    03/08/17 1104  BP: (!) 196/98  Pulse: 71  Resp: 20  SpO2: 95%  Weight: 188 lb 4 oz (85.4 kg)  Height: 5\' 3"  (1.6 m)   Wt Readings from Last 3 Encounters:  03/08/17 188 lb 4 oz (85.4 kg)  02/24/17 191 lb (86.6 kg)  02/09/17 182 lb (82.6 kg)   Lab Results  Component Value Date   CREATININE 11.30 (H) 02/24/2017   CREATININE 7.12 (H) 02/09/2017   CREATININE 10.44 (H) 02/02/2017      Assessment & Plan:  1: Chronic heart failure with preserved ejection fraction-   - NYHA class II - euvolemic today - weighing daily; weight down 3 pounds since she was last here and is due for dialysis tomorrow. Reminded to call for an overnight weight gain of >2 pounds or a weekly weight gain of >5 pounds - maintaining a 32 ounce fluid restriction per nephrology  - elevate legs when sitting for long periods of time - wearing oxygent at 2L PRN - saw cardiologist Rockey Situ) 01/14/17 - not adding salt and is trying to eat low sodium foods. Discussed the importance of following a 2000mg  sodium diet. Low sodium cookbook was given to her  2: HTN-  - BP elevated today - she hasn't taken her losartan yet today as she wasn't sure she could take it with her other medications - she will be taking it upon her return home  3.ESRD on dialysis-  - dialysis on T, TH, Sat  4: Diabetes- - currently diet controlled  Patient did not bring her medications nor a list. Each medication was verbally reviewed with the patient and she was encouraged to bring the bottles to every visit to confirm accuracy of list.  Return here in 1 month or sooner for any questions/problems before then.

## 2017-03-11 ENCOUNTER — Telehealth: Payer: Self-pay

## 2017-03-11 NOTE — Telephone Encounter (Signed)
Spoke with Ms. Termine and advised she can take another Losartan as her BP is still elevated.

## 2017-03-11 NOTE — Telephone Encounter (Signed)
Jasmine Holloway called the office this morning reporting that her blood pressure was elevated this morning. She got a reading of 193/82. She did take her medication this morning before dialysis and was wondering if she could take another dose of losartan.     Please advise.

## 2017-03-11 NOTE — Telephone Encounter (Signed)
Thank you for getting back in touch with her about taking an additional losartan.

## 2017-03-18 ENCOUNTER — Encounter: Payer: Self-pay | Admitting: Gastroenterology

## 2017-03-18 ENCOUNTER — Ambulatory Visit (INDEPENDENT_AMBULATORY_CARE_PROVIDER_SITE_OTHER): Payer: Medicare Other | Admitting: Gastroenterology

## 2017-03-18 VITALS — BP 177/93 | HR 63 | Temp 98.3°F | Ht 63.0 in | Wt 187.4 lb

## 2017-03-18 DIAGNOSIS — D649 Anemia, unspecified: Secondary | ICD-10-CM

## 2017-03-18 NOTE — Progress Notes (Signed)
Jonathon Bellows MD, MRCP(U.K) [address]  [place]  [place], [place] [address]  Main: [phone]  Fax: [phone]   Primary Care Physician: Theotis Burrow, MD  Primary Gastroenterologist:  Dr. Jonathon Bellows   No chief complaint on file.   HPI: Jasmine Holloway is a 56 y.o. female    She is here today for a follow up visit.    Summary of history :  She has a history of ESRD on dialysis awaiting a renal transplant. Referred in 12/2016 for anemia and drop in Hb acutely  .Hb 8.3 grams ,MCV 88 , plt 447 , r Hb was 10.8 grams in 10/2016 . Last colonoscopy in 2017   Interval history   12/22/2016-  03/18/2017    Iron studies showed no iron deficiency. Normal celiac and folate serology.  EGD 01/2017 showed a benign esophageal stricture   Denies any overt blood loss. Undergoing dialysis. No complaints   Current Outpatient Prescriptions  Medication Sig Dispense Refill  . acetaminophen (TYLENOL) 325 MG tablet Take 2 tablets (650 mg total) by mouth every 6 (six) hours as needed for mild pain or headache.    Marland Kitchen amLODipine (NORVASC) 5 MG tablet Take 5 mg by mouth daily.    Marland Kitchen aspirin 81 MG chewable tablet Chew 1 tablet (81 mg total) by mouth daily. 30 tablet 2  . b complex-vitamin c-folic acid (NEPHRO-VITE) 0.8 MG TABS tablet Take 1 tablet by mouth at bedtime.    . calcium acetate (PHOSLO) 667 MG capsule Take 1 capsule (667 mg total) by mouth 3 (three) times daily with meals.    . cloNIDine (CATAPRES) 0.1 MG tablet Take 1 tablet (0.1 mg total) by mouth 2 (two) times daily. 60 tablet 2  . diphenhydrAMINE (BENADRYL) 25 mg capsule Take 2 capsules (50 mg total) by mouth every 6 (six) hours as needed. 60 capsule 0  . furosemide (LASIX) 80 MG tablet Take 1 tablet (80 mg total) by mouth daily. 90 tablet 3  . gabapentin (NEURONTIN) 300 MG capsule Take 300 mg by mouth 2 (two) times daily.     . hydrALAZINE (APRESOLINE) 50 MG tablet Take 1 tablet (50 mg total) by mouth 3 (three) times daily. 90  tablet 0  . lidocaine (XYLOCAINE) 5 % ointment Apply topically.    Marland Kitchen LORazepam (ATIVAN) 1 MG tablet Take 1 tablet (1 mg total) by mouth 2 (two) times daily as needed for anxiety. 10 tablet 0  . losartan (COZAAR) 25 MG tablet Take 1 tablet (25 mg total) by mouth daily. 90 tablet 3  . magnesium oxide (MAG-OX) 400 MG tablet Take 400 mg by mouth daily.    . metoprolol succinate (TOPROL-XL) 50 MG 24 hr tablet Take 50 mg by mouth 2 (two) times daily.     . nitroGLYCERIN (NITROSTAT) 0.4 MG SL tablet Place 1 tablet (0.4 mg total) under the tongue every 5 (five) minutes as needed for chest pain. 25 tablet 3  . omeprazole (PRILOSEC) 40 MG capsule Take by mouth daily.     . ondansetron (ZOFRAN) 4 MG tablet Take 1 tablet (4 mg total) by mouth every 8 (eight) hours as needed for nausea or vomiting. 21 tablet 0  . oxyCODONE-acetaminophen (PERCOCET) 10-325 MG tablet Take 1 tablet by mouth daily as needed for pain.    Marland Kitchen prochlorperazine (COMPAZINE) 10 MG tablet Take 1 tablet (10 mg total) by mouth every 6 (six) hours as needed for nausea. 20 tablet 0  . rosuvastatin (CRESTOR) 5 MG  tablet Take 5 mg by mouth daily.    . SENSIPAR 30 MG tablet Take 1 tablet (30 mg total) by mouth daily. 60 tablet 1  . VENTOLIN HFA 108 (90 Base) MCG/ACT inhaler Inhale 2 puffs into the lungs daily as needed.      No current facility-administered medications for this visit.     Allergies as of 03/18/2017 - Review Complete 03/08/2017  Allergen Reaction Noted  . Morphine and related Nausea And Vomiting 02/24/2015  . Strawberry extract  08/18/2016  . Tramadol Nausea And Vomiting 08/05/2016  . Venofer [iron sucrose]  08/18/2016  . Vicodin [hydrocodone-acetaminophen] Nausea And Vomiting 02/24/2015  . Buprenorphine hcl Nausea And Vomiting 12/21/2015  . Codeine Nausea And Vomiting 12/21/2015  . Morphine Nausea And Vomiting 10/24/2014    ROS:  General: Negative for anorexia, weight loss, fever, chills, fatigue, weakness. ENT:  Negative for hoarseness, difficulty swallowing , nasal congestion. CV: Negative for chest pain, angina, palpitations, dyspnea on exertion, peripheral edema.  Respiratory: Negative for dyspnea at rest, dyspnea on exertion, cough, sputum, wheezing.  GI: See history of present illness. GU:  Negative for dysuria, hematuria, urinary incontinence, urinary frequency, nocturnal urination.  Endo: Negative for unusual weight change.    Physical Examination:   There were no vitals taken for this visit.  General: Well-nourished, well-developed in no acute distress.  Eyes: No icterus. Conjunctivae pink. Mouth: Oropharyngeal mucosa moist and pink , no lesions erythema or exudate. Lungs: Clear to auscultation bilaterally. Non-labored. Heart: Regular rate and rhythm, no murmurs rubs or gallops.  Abdomen: Bowel sounds are normal, nontender, nondistended, no hepatosplenomegaly or masses, no abdominal bruits or hernia , no rebound or guarding.   Extremities: No lower extremity edema. No clubbing or deformities.Shunt over rt forearm.  Neuro: Alert and oriented x 3.  Grossly intact. Skin: Warm and dry, no jaundice.   Psych: Alert and cooperative, normal mood and affect.   Imaging Studies: Dg Chest 2 View  Result Date: 02/24/2017 CLINICAL DATA:  56 year old female with shortness of breath. EXAM: CHEST  2 VIEW COMPARISON:  Chest radiograph dated 02/01/2017 FINDINGS: There is moderate cardiomegaly with vascular congestion. No focal consolidation, pleural effusion, or pneumothorax. There is overall better aeration of the lungs compared to the prior radiograph No acute osseous pathology. Left axillary surgical clips noted. IMPRESSION: Cardiomegaly with vascular congestion. No focal consolidation. Overall improved aeration of the lungs since the prior radiograph. Electronically Signed   By: Anner Crete M.D.   On: 02/24/2017 23:24    Assessment and Plan:   Jasmine Holloway is a 56 y.o. y/o female here to follow  up for acute drop in Hb with no overt GI blood loss, normal  iron studies available. No overt blood loss. Recent colonoscopy 1 year back. EGD in 01/2017 showed no source of blood loss  Plan   1. Hb stable at baseline. Suggest to CBC and if drops yet again can consider then repeat colonoscopy +/- capsule study    F/u PRN  Dr Jonathon Bellows  MD,MRCP [facility])

## 2017-03-23 ENCOUNTER — Telehealth: Payer: Self-pay

## 2017-03-23 NOTE — Telephone Encounter (Signed)
Ms. Kolinski called the office this afternoon she states that she had dialysis this morning and she had to get off the machine 36 minutes early because she was not feeling well. She states she was very sick to her stomach.    She states that since she has gotten home she still feels sick to her stomach and she has a really bad headache. She states that she did take her blood pressure which was reading 181/95 which is not unusual for her.   I asked if she had, had anything to eat or drink today and she states that she has had some [place] bacon but that is all.   Per [name] FNP pt has been advised to try to eat something and get some fluid in and contact her PCP to advise them of her symptoms.  Offered to see patient in clinic tomorrow, however patient has lack of transportation and can not get a ride with [employer] with out a 3 day notice. Patient was also advised that if her symptoms worsen she will need to contact 911 and be seen in the ED.   Patient verbalized understanding and will contact her PCP.

## 2017-03-24 ENCOUNTER — Ambulatory Visit: Admit: 2017-03-24 | Discharge: 2017-03-24 | Payer: MEDICARE

## 2017-03-29 ENCOUNTER — Emergency Department
Admission: EM | Admit: 2017-03-29 | Discharge: 2017-03-29 | Disposition: A | Discharge status: Home / Self Care | Payer: Medicare Other | Attending: Emergency Medicine | Admitting: Emergency Medicine

## 2017-03-29 ENCOUNTER — Encounter: Payer: Self-pay | Admitting: Emergency Medicine

## 2017-03-29 DIAGNOSIS — I509 Heart failure, unspecified: Secondary | ICD-10-CM | POA: Insufficient documentation

## 2017-03-29 DIAGNOSIS — J449 Chronic obstructive pulmonary disease, unspecified: Secondary | ICD-10-CM | POA: Diagnosis not present

## 2017-03-29 DIAGNOSIS — Z87891 Personal history of nicotine dependence: Secondary | ICD-10-CM | POA: Diagnosis not present

## 2017-03-29 DIAGNOSIS — R040 Epistaxis: Secondary | ICD-10-CM

## 2017-03-29 DIAGNOSIS — N189 Chronic kidney disease, unspecified: Secondary | ICD-10-CM | POA: Diagnosis not present

## 2017-03-29 DIAGNOSIS — Z79899 Other long term (current) drug therapy: Secondary | ICD-10-CM | POA: Diagnosis not present

## 2017-03-29 DIAGNOSIS — E119 Type 2 diabetes mellitus without complications: Secondary | ICD-10-CM | POA: Insufficient documentation

## 2017-03-29 DIAGNOSIS — I130 Hypertensive heart and chronic kidney disease with heart failure and stage 1 through stage 4 chronic kidney disease, or unspecified chronic kidney disease: Secondary | ICD-10-CM | POA: Insufficient documentation

## 2017-03-29 DIAGNOSIS — I10 Essential (primary) hypertension: Secondary | ICD-10-CM

## 2017-03-29 DIAGNOSIS — J45909 Unspecified asthma, uncomplicated: Secondary | ICD-10-CM | POA: Diagnosis not present

## 2017-03-29 MED ORDER — ACETAMINOPHEN 500 MG PO TABS
1000.0000 mg | ORAL_TABLET | Freq: Once | ORAL | Status: AC
  Administered 2017-03-29: 1000 mg via ORAL
  Filled 2017-03-29: qty 2

## 2017-03-29 MED ORDER — TRANEXAMIC ACID 1000 MG/10ML IV SOLN
500.0000 mg | Freq: Once | INTRAVENOUS | Status: AC
  Administered 2017-03-29: 500 mg via TOPICAL
  Filled 2017-03-29: qty 10

## 2017-03-29 MED ORDER — CEPHALEXIN 500 MG PO CAPS
500.0000 mg | ORAL_CAPSULE | Freq: Once | ORAL | Status: AC
  Administered 2017-03-29: 500 mg via ORAL
  Filled 2017-03-29: qty 1

## 2017-03-29 MED ORDER — CEPHALEXIN 500 MG PO CAPS: 500.0000 mg | ORAL_CAPSULE | Freq: Three times a day (TID) | ORAL | 0 refills | Status: AC

## 2017-03-29 MED ORDER — SILVER NITRATE-POT NITRATE 75-25 % EX MISC
2.0000 "application " | Freq: Once | CUTANEOUS | Status: AC
  Administered 2017-03-29: 2 via TOPICAL

## 2017-03-29 MED ORDER — ONDANSETRON 4 MG PO TBDP
ORAL_TABLET | ORAL | Status: AC
  Administered 2017-03-29: 4 mg via ORAL
  Filled 2017-03-29: qty 1

## 2017-03-29 MED ORDER — HYDRALAZINE HCL 20 MG/ML IJ SOLN
5.0000 mg | Freq: Once | INTRAMUSCULAR | Status: AC
  Administered 2017-03-29: 5 mg via INTRAVENOUS

## 2017-03-29 MED ORDER — HYDRALAZINE HCL 20 MG/ML IJ SOLN
5.0000 mg | Freq: Once | INTRAMUSCULAR | Status: AC
  Administered 2017-03-29: 5 mg via INTRAVENOUS
  Filled 2017-03-29: qty 1

## 2017-03-29 MED ORDER — SILVER NITRATE-POT NITRATE 75-25 % EX MISC
CUTANEOUS | Status: AC
  Administered 2017-03-29: 2 via TOPICAL
  Filled 2017-03-29: qty 2

## 2017-03-29 MED ORDER — ONDANSETRON 4 MG PO TBDP
4.0000 mg | ORAL_TABLET | Freq: Once | ORAL | Status: AC
  Administered 2017-03-29: 4 mg via ORAL

## 2017-03-29 MED ORDER — PHENYLEPHRINE HCL 0.5 % NA SOLN
1.0000 [drp] | Freq: Once | NASAL | Status: AC
  Administered 2017-03-29: 1 [drp] via NASAL
  Filled 2017-03-29: qty 15

## 2017-03-29 NOTE — ED Triage Notes (Signed)
Pt arrived via ems from home. Pt arrived with complaints of nose bleed that started today and has never happened before, per pt recollection. EMS administered afrin in route and  Placed nose clamp. Pt goes to dialysis on Saturday, Tuesday, and Thursday. EMS reports manual blood pressure of 270/148. Pt's initial triage blood pressure 196/126 and second reading 224/117.

## 2017-03-29 NOTE — ED Provider Notes (Signed)
[facility] Emergency Department Provider Note  ____________________________________________  Time seen: Approximately 5:04 AM  I have reviewed the triage vital signs and the nursing notes.   HISTORY  Chief Complaint Epistaxis   HPI Jasmine Holloway is a 56 y.o. female with a history of hypertension who presents for evaluation of epistaxis. Patient reports that she was watching TV when she started bleeding from her right nostril. Patient reports several episodes of epistaxis when she was a kid but none for several years. She denies dizziness, chest pain or shortness of breath. She endorses compliance with her antihypertensive medications. She denies nose picking. She does use oxygen at night time and this happened right after she placed a nasal cannula in her nose. She is not on any blood thinners.  Past Medical History:  Diagnosis Date  . Asthma   . CHF (congestive heart failure) ([place])   . COPD (chronic obstructive pulmonary disease) ([place])   . Diabetes mellitus without complication ([place])   . Diastolic heart failure ([place])   . ESRD (end stage renal disease) on dialysis ([place])   . Hypertension   . Neuropathy   . Polycystic kidney disease   . Renal insufficiency     Patient Active Problem List   Diagnosis Date Noted  . Diabetes ([place]) 02/10/2017  . Esophageal stricture   . Hypoxia 11/10/2016  . Shortness of breath 10/26/2016  . COPD (chronic obstructive pulmonary disease) ([place]) 08/06/2016  . Hypertensive crisis 08/06/2016  . Atypical chest pain 06/08/2016  . Chronic pain syndrome 06/08/2016  . Anemia 06/08/2016  . CHF exacerbation ([place]) 04/28/2016  . Epigastric pain 03/28/2016  . Chronic diastolic heart failure ([place]) 02/04/2016  . Hoarseness of voice 02/04/2016  . Nicotine dependence, uncomplicated [id number]  . ESRF (end stage renal failure) ([place]) 12/22/2015  . Essential hypertension 12/22/2015  . Mixed hyperlipidemia 12/22/2015  . Knee pain, left  anterior 06/11/2015  . Arm pain, left 05/31/2015  . Pain of left arm 05/31/2015  . Closed bilateral ankle fractures, with routine healing, subsequent encounter 07/24/2014  . Closed bilateral ankle fractures, initial encounter 06/20/2014    Past Surgical History:  Procedure Laterality Date  . ARTERIOVENOUS GRAFT PLACEMENT Right    x3 (R forearm currently used for access)  . BREAST BIOPSY Left 12/03/2015   neg  . COLON SURGERY    . ESOPHAGOGASTRODUODENOSCOPY (EGD) WITH PROPOFOL N/A 02/04/2017   Procedure: ESOPHAGOGASTRODUODENOSCOPY (EGD) WITH PROPOFOL;  Surgeon: Jonathon Bellows, MD;  Location: [facility] ENDOSCOPY;  Service: Endoscopy;  Laterality: N/A;  . PERIPHERAL VASCULAR CATHETERIZATION Right 08/04/2016   Procedure: A/V Shuntogram;  Surgeon: Algernon Huxley, MD;  Location: [facility] CV LAB;  Service: Cardiovascular;  Laterality: Right;  . VEIN HARVEST      Prior to Admission medications   Medication Sig Start Date End Date Taking? Authorizing Provider  acetaminophen (TYLENOL) 325 MG tablet Take 2 tablets (650 mg total) by mouth every 6 (six) hours as needed for mild pain or headache. 03/18/16   Gouru, Illene Silver, MD  amLODipine (NORVASC) 5 MG tablet Take 5 mg by mouth daily. 08/31/14   [provider]  aspirin 81 MG chewable tablet Chew 1 tablet (81 mg total) by mouth daily. 01/17/16   Gladstone Lighter, MD  b complex-vitamin c-folic acid (NEPHRO-VITE) 0.8 MG TABS tablet Take 1 tablet by mouth at bedtime.    [provider]  calcium acetate (PHOSLO) 667 MG capsule Take 1 capsule (667 mg total) by mouth 3 (three) times daily with  meals. 02/02/17   Loletha Grayer, MD  cephALEXin (KEFLEX) 500 MG capsule Take 1 capsule (500 mg total) by mouth 3 (three) times daily. 03/29/17 04/05/17  Rudene Re, MD  cloNIDine (CATAPRES) 0.1 MG tablet Take 1 tablet (0.1 mg total) by mouth 2 (two) times daily. 01/17/16   Gladstone Lighter, MD  diphenhydrAMINE (BENADRYL) 25 mg capsule Take 2 capsules  (50 mg total) by mouth every 6 (six) hours as needed. 10/16/16   Carrie Mew, MD  furosemide (LASIX) 80 MG tablet Take 1 tablet (80 mg total) by mouth daily. 03/05/16 11/10/17  Alisa Graff, FNP  gabapentin (NEURONTIN) 300 MG capsule Take 300 mg by mouth 2 (two) times daily.     [provider]  hydrALAZINE (APRESOLINE) 50 MG tablet Take 1 tablet (50 mg total) by mouth 3 (three) times daily. Patient not taking: Reported on 03/18/2017 02/02/17   Loletha Grayer, MD  lidocaine (XYLOCAINE) 5 % ointment Apply topically.    [provider]  LORazepam (ATIVAN) 1 MG tablet Take 1 tablet (1 mg total) by mouth 2 (two) times daily as needed for anxiety. Patient not taking: Reported on 03/18/2017 07/21/16   Daymon Larsen, MD  losartan (COZAAR) 25 MG tablet Take 1 tablet (25 mg total) by mouth daily. 02/08/17   [name] A, FNP  magnesium oxide (MAG-OX) 400 MG tablet Take 400 mg by mouth daily.    [provider]  metoprolol succinate (TOPROL-XL) 50 MG 24 hr tablet Take 50 mg by mouth 2 (two) times daily.  01/22/17   [provider]  nitroGLYCERIN (NITROSTAT) 0.4 MG SL tablet Place 1 tablet (0.4 mg total) under the tongue every 5 (five) minutes as needed for chest pain. 01/14/17 04/14/17  Minna Merritts, MD  omeprazole (PRILOSEC) 40 MG capsule Take by mouth daily.  12/20/16   [provider]  ondansetron (ZOFRAN) 4 MG tablet Take 1 tablet (4 mg total) by mouth every 8 (eight) hours as needed for nausea or vomiting. 07/21/16   Daymon Larsen, MD  oxyCODONE-acetaminophen (PERCOCET) 10-325 MG tablet Take 1 tablet by mouth daily as needed for pain.    [provider]  prochlorperazine (COMPAZINE) 10 MG tablet Take 1 tablet (10 mg total) by mouth every 6 (six) hours as needed for nausea. Patient not taking: Reported on 03/18/2017 10/15/16   Paulette Blanch, MD  rosuvastatin (CRESTOR) 5 MG tablet Take 5 mg by mouth daily.    [provider]  SENSIPAR 30 MG  tablet Take 1 tablet (30 mg total) by mouth daily. 07/07/16   Fritzi Mandes, MD  VENTOLIN HFA 108 (90 Base) MCG/ACT inhaler Inhale 2 puffs into the lungs daily as needed.  05/21/16   [provider]    Allergies Morphine and related; Strawberry extract; Tramadol; Venofer [iron sucrose]; Vicodin [hydrocodone-acetaminophen]; Buprenorphine hcl; Codeine; and Morphine  Family History  Problem Relation Age of Onset  . Hypertension Brother   . Heart failure Brother   . Hypertension Mother   . Heart disease Mother     Social History Social History  Substance Use Topics  . Smoking status: Former Smoker    Packs/day: 0.25    Quit date: 08/11/2015  . Smokeless tobacco: Never Used  . Alcohol use No    Review of Systems  Constitutional: Negative for fever. Eyes: Negative for visual changes. ENT: Negative for sore throat. + epistaxis Neck: No neck pain  Cardiovascular: Negative for chest pain. Respiratory: Negative for shortness of breath.  Gastrointestinal: Negative for abdominal pain, vomiting or diarrhea. Genitourinary: Negative for dysuria. Musculoskeletal: Negative for back pain. Skin: Negative for rash. Neurological: Negative for headaches, weakness or numbness. Psych: No SI or HI  ____________________________________________   PHYSICAL EXAM:  VITAL SIGNS: ED Triage Vitals  Enc Vitals Group     BP 03/29/17 0100 (!) 196/126     Pulse Rate 03/29/17 0100 80     Resp 03/29/17 0100 (!) 22     Temp 03/29/17 0100 97.7 F (36.5 C)     Temp Source 03/29/17 0100 Axillary     SpO2 03/29/17 0100 93 %     Weight 03/29/17 0101 185 lb (83.9 kg)     Height 03/29/17 0101 5\' 3"  (1.6 m)     Head Circumference --      Peak Flow --      Pain Score --      Pain Loc --      Pain Edu? --      Excl. in [place]? --     Constitutional: Alert and oriented. Well appearing and in no apparent distress. HEENT:      Head: Normocephalic and atraumatic.         Eyes: Conjunctivae are normal.  Sclera is non-icteric.       Mouth/Throat: Mucous membranes are moist.       Nose: R nostril anterior epistaxis      Neck: Supple with no signs of meningismus. Cardiovascular: Regular rate and rhythm. No murmurs, gallops, or rubs. 2+ symmetrical distal pulses are present in all extremities. No JVD. Respiratory: Normal respiratory effort. Lungs are clear to auscultation bilaterally. No wheezes, crackles, or rhonchi.  Musculoskeletal: Nontender with normal range of motion in all extremities. No edema, cyanosis, or erythema of extremities. Neurologic: Normal speech and language. Face is symmetric. Moving all extremities. No gross focal neurologic deficits are appreciated. Skin: Skin is warm, dry and intact. No rash noted. Psychiatric: Mood and affect are normal. Speech and behavior are normal.  ____________________________________________   LABS (all labs ordered are listed, but only abnormal results are displayed)  Labs Reviewed - No data to display ____________________________________________  EKG  ED ECG REPORT I, Rudene Re, the attending physician, personally viewed and interpreted this ECG.  Normal sinus rhythm, rate of 79, first-degree AV block, normal QRS and QTc intervals, normal axis, LVH, no ST elevations or depressions. No significant changes when compared to prior. ____________________________________________  [id number]  none  ____________________________________________   PROCEDURES  Procedure(s) performed:yes Procedures   Epistaxis Management Date/Time: 03/29/2017 at 5:07 AM Performed by: Rudene Re Authorized by: Rudene Re Consent: Verbal consent obtained. Written consent not obtained. Risks and benefits: risks, benefits and alternatives were discussed Consent given by: patient Required items: required blood products, implants, devices, and special equipment available Patient sedated: no Repair method: Afrin and pressure x 2 with no  improvement, followed by 5mg  of TXA and pressure for 20 min with recurrence followed by Rhino Rocket soaked with TXA Post-procedure assessment: bleeding stopped after 15 minutes with gauze in place Treatment complexity: simple Patient tolerance: Patient tolerated the procedure well with no immediate complications   Critical Care performed:  None ____________________________________________   INITIAL IMPRESSION / ASSESSMENT AND PLAN / ED COURSE  56 y.o. female with a history of hypertension who presents for evaluation of epistaxis and elevated blood pressure. Patient received it 25 mg hydralazine with improvement of her blood pressure. Several attempts with Afrin and topical TXA failed. Rhino Rocket was placed with  resolution of the bleed. Patient will be discharged home with [facility] in place, follow up with ENT, and Keflex.     Pertinent labs & imaging results that were available during my care of the patient were reviewed by me and considered in my medical decision making (see chart for details).    ____________________________________________   FINAL CLINICAL IMPRESSION(S) / ED DIAGNOSES  Final diagnoses:  Epistaxis  Hypertension, unspecified type      NEW MEDICATIONS STARTED DURING THIS VISIT:  New Prescriptions   CEPHALEXIN (KEFLEX) 500 MG CAPSULE    Take 1 capsule (500 mg total) by mouth 3 (three) times daily.     Note:  This document was prepared using Dragon voice recognition software and may include unintentional dictation errors.    Rudene Re, MD 03/29/17 [phone]

## 2017-03-29 NOTE — Discharge Instructions (Signed)
Leave the packing in place until you're seen by ENT. Call their office in the morning for close follow-up. Take your blood pressure medications as prescribed. Take the antibiotics as prescribed until the packing is removed. Return to the emergency room if you have fever.

## 2017-03-29 NOTE — ED Notes (Signed)
Pt's nose is actively bleeding. MD made aware.

## 2017-04-05 ENCOUNTER — Ambulatory Visit: Admission: RE | Admit: 2017-04-05 | Discharge: 2017-04-05 | Attending: Surgery

## 2017-04-05 ENCOUNTER — Telehealth: Payer: Self-pay | Admitting: Family

## 2017-04-05 ENCOUNTER — Ambulatory Visit: Payer: Medicare Other | Admitting: Family

## 2017-04-05 NOTE — Telephone Encounter (Signed)
Patient did not show for her [facility] appointment on 04/05/17. Will attempt to reschedule.

## 2017-04-06 IMAGING — US US ABDOMEN COMPLETE
1 series · 14 of 25 positions shown · non-contrast
Comparison: None.

CLINICAL DATA: Abdominal pain for 1 week.

EXAM:
ABDOMEN ULTRASOUND COMPLETE

[Series 1: us abdomen complete · 0.22mm/px · 14 of 126 slices shown]
[im 1/126]
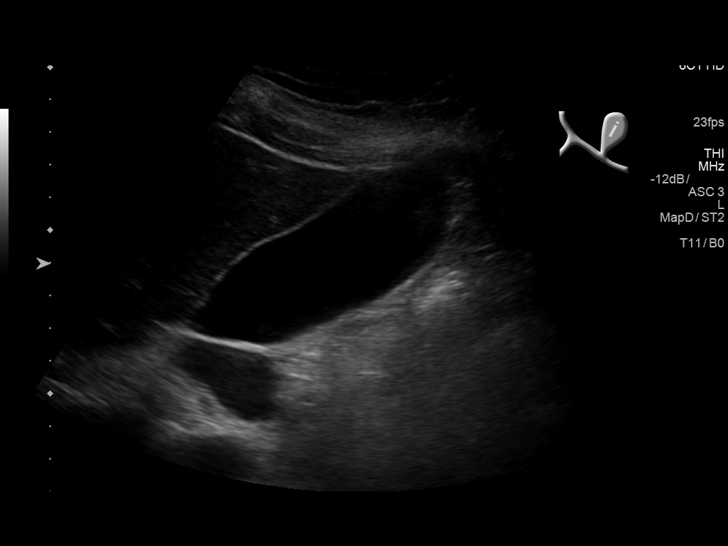
[im 11/126]
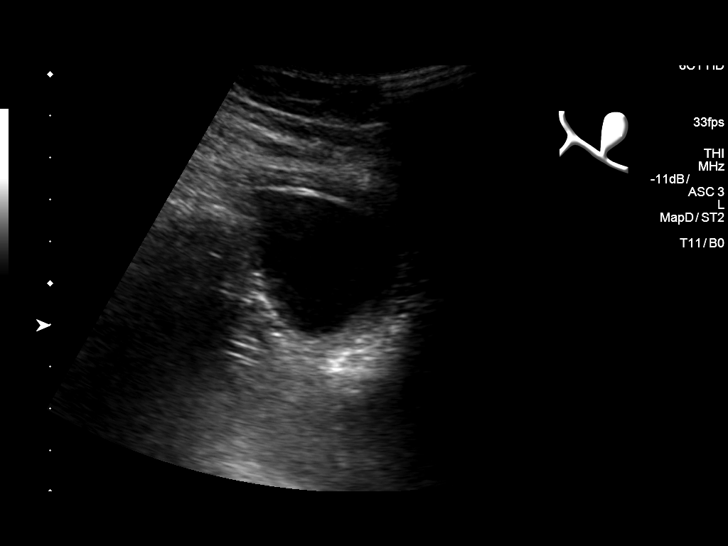
[im 21/126]
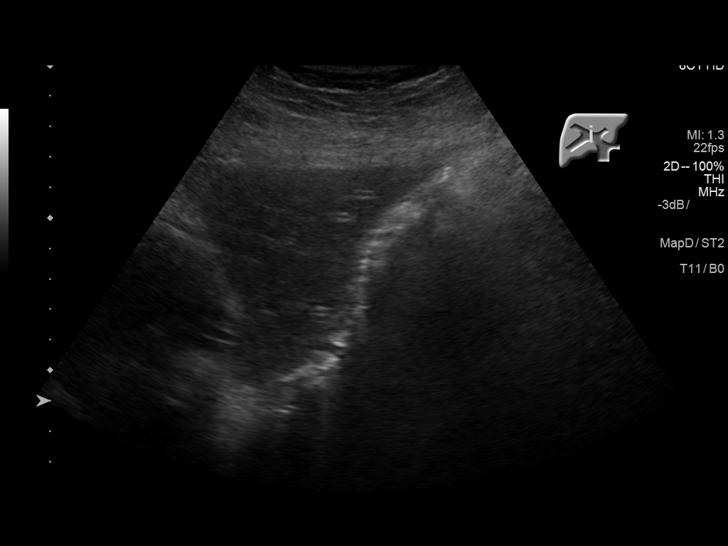
[im 32/126]
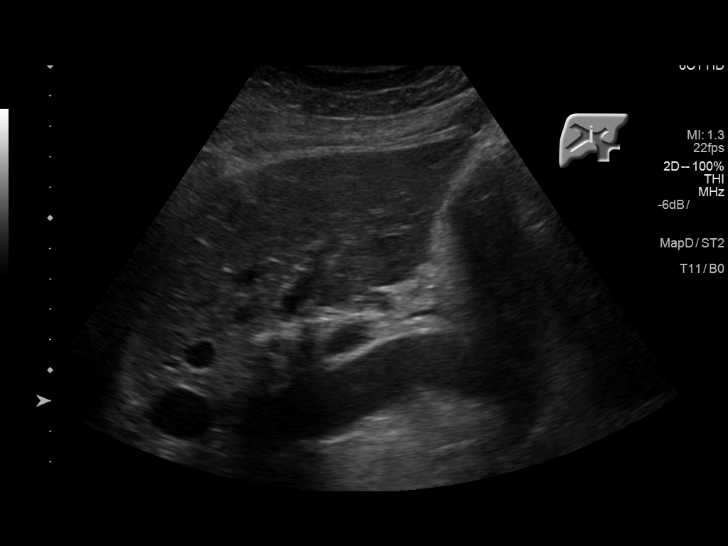
[im 42/126]
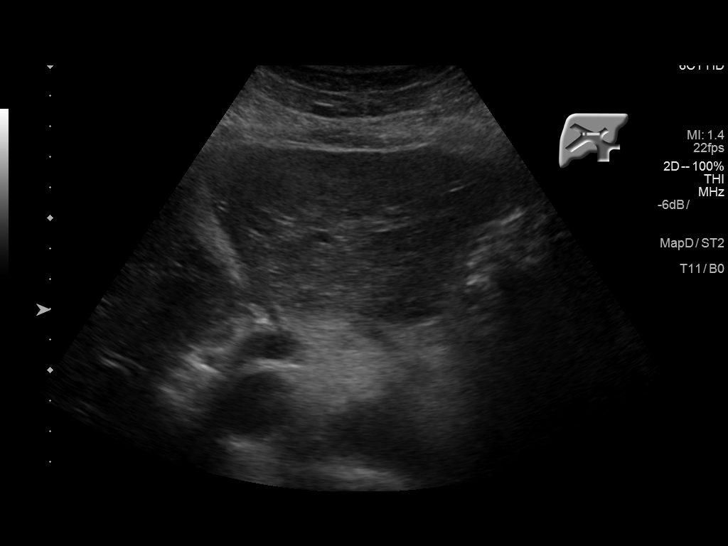
[im 47/126]
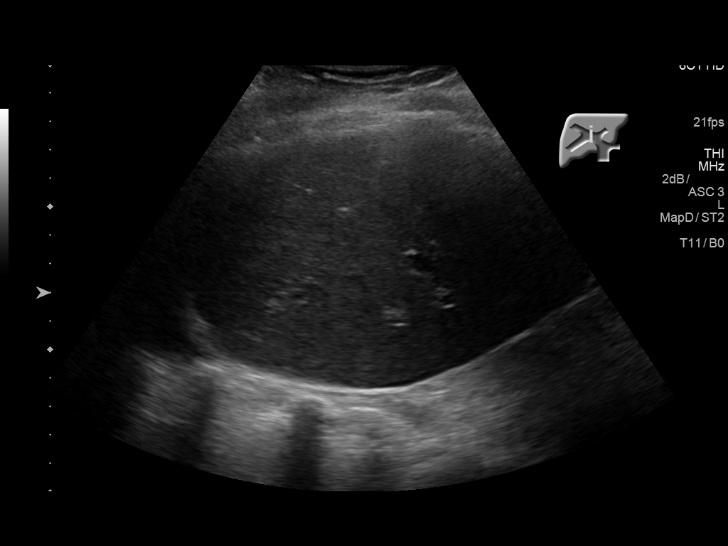
[im 58/126]
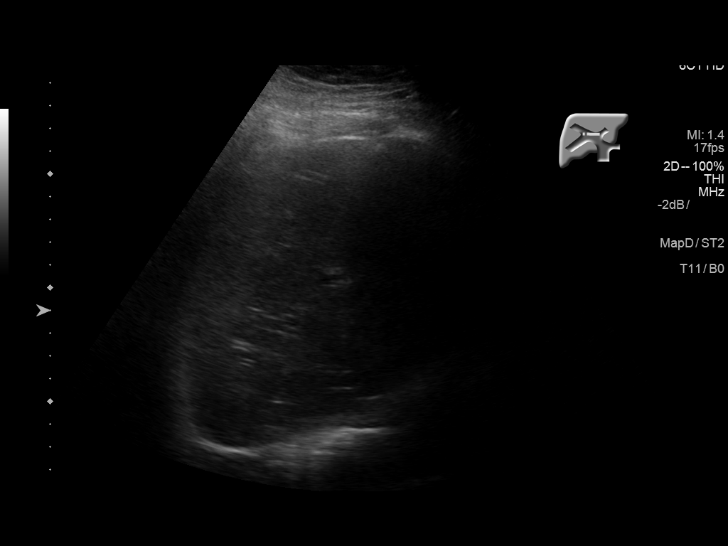
[im 68/126]
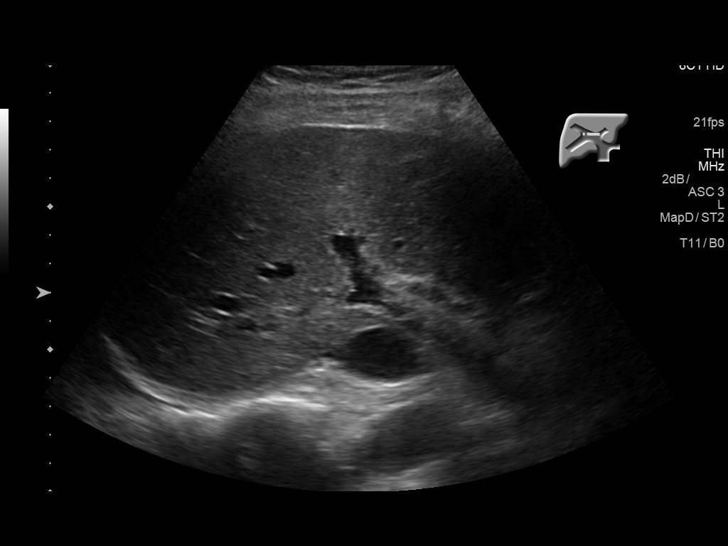
[im 79/126]
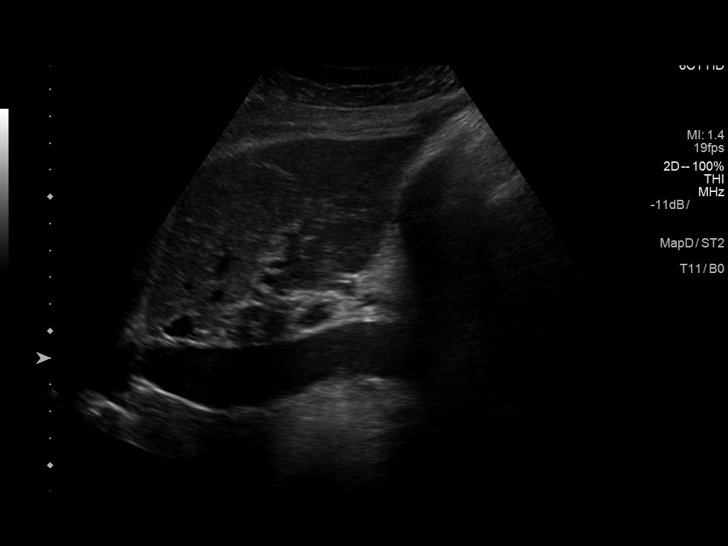
[im 84/126]
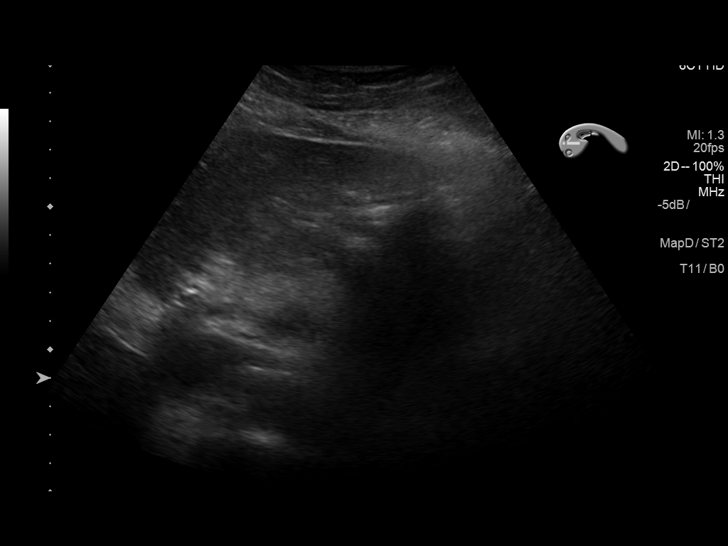
[im 94/126]
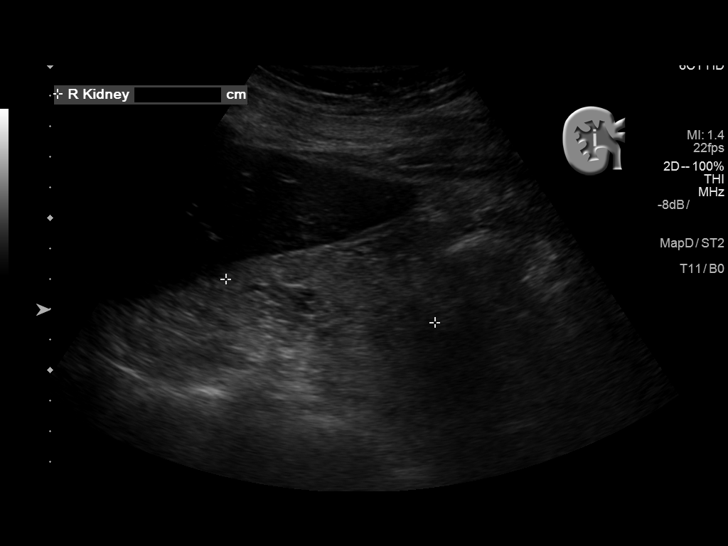
[im 105/126]
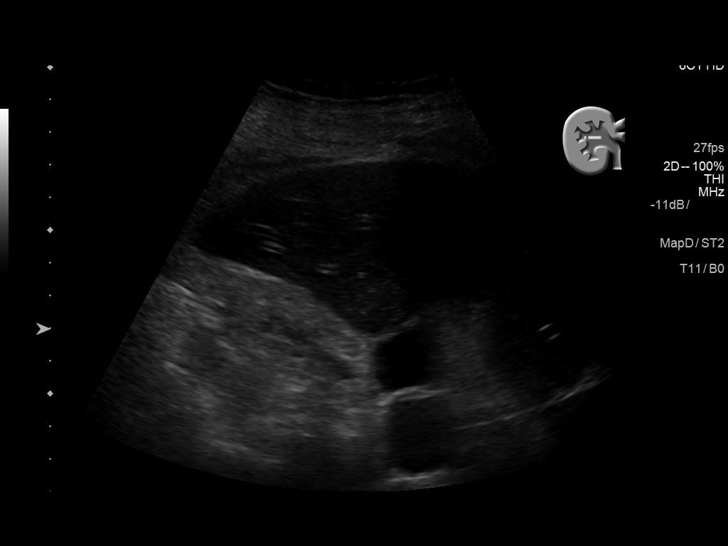
[im 115/126]
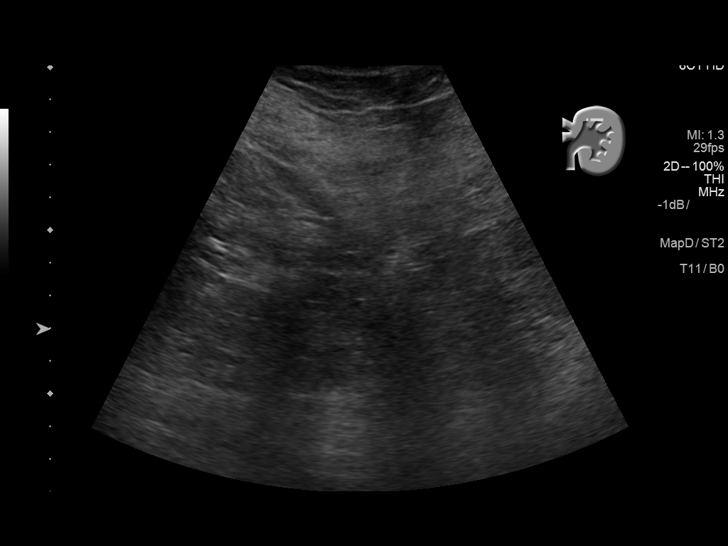
[im 126/126]
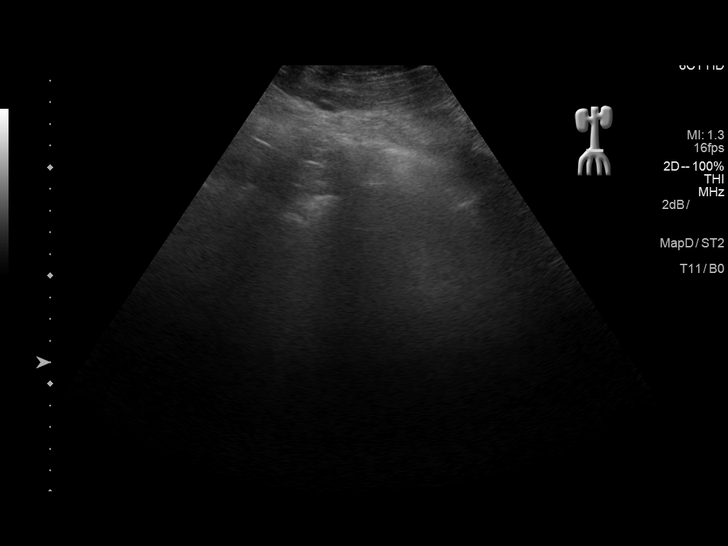

[14 of 25 positions shown; findings below may reference images not displayed]

FINDINGS: Gallbladder: No gallstones or wall thickening visualized. No
sonographic Murphy sign noted by sonographer.

Common bile duct: Diameter: 8.1 mm.

Liver: The liver has a coarsened echotexture. No focal liver
abnormalities. Mild intrahepatic bile duct dilatation.

IVC: No abnormality visualized.

Pancreas: Not visualized due to overlying bowel gas

Spleen: Size and appearance within normal limits.

Right Kidney: Length: 7 cm.. Diffuse cortical thinning. Increased
parenchymal echogenicity. No hydronephrosis or mass.

Left Kidney: Length: 7 cm. Increased parenchymal echogenicity.
Diffuse cortical thinning. No mass or hydronephrosis..

Abdominal aorta: No aneurysm visualized.

Other findings: None.
IMPRESSION: 1. Increase caliber of the common bile duct with mild intrahepatic
bile duct dilatation. If there is a clinical concern for
choledocholithiasis then MRCP may be helpful for further assessment.
2. Bilateral echogenic kidneys with diffuse cortical thinning
compatible with chronic medical did renal disease.
3. Coarsened liver echotexture.

## 2017-04-08 DIAGNOSIS — N186 End stage renal disease: Secondary | ICD-10-CM

## 2017-04-08 DIAGNOSIS — Z7682 Awaiting organ transplant status: Secondary | ICD-10-CM

## 2017-04-08 DIAGNOSIS — Z01818 Encounter for other preprocedural examination: Principal | ICD-10-CM

## 2017-04-09 ENCOUNTER — Ambulatory Visit: Admission: RE | Admit: 2017-04-09 | Discharge: 2017-04-09 | Disposition: A | Discharge status: Home / Self Care

## 2017-04-09 ENCOUNTER — Ambulatory Visit
Admission: RE | Admit: 2017-04-09 | Discharge: 2017-04-09 | Disposition: A | Discharge status: Home / Self Care | Payer: MEDICARE

## 2017-04-09 ENCOUNTER — Encounter: Payer: Self-pay | Admitting: Emergency Medicine

## 2017-04-09 ENCOUNTER — Emergency Department
Admission: EM | Admit: 2017-04-09 | Discharge: 2017-04-09 | Disposition: A | Discharge status: Home / Self Care | Payer: Medicare Other | Attending: Emergency Medicine | Admitting: Emergency Medicine

## 2017-04-09 DIAGNOSIS — J449 Chronic obstructive pulmonary disease, unspecified: Secondary | ICD-10-CM | POA: Diagnosis not present

## 2017-04-09 DIAGNOSIS — Z7982 Long term (current) use of aspirin: Secondary | ICD-10-CM | POA: Diagnosis not present

## 2017-04-09 DIAGNOSIS — Z79899 Other long term (current) drug therapy: Secondary | ICD-10-CM | POA: Insufficient documentation

## 2017-04-09 DIAGNOSIS — N186 End stage renal disease: Secondary | ICD-10-CM | POA: Diagnosis not present

## 2017-04-09 DIAGNOSIS — Z992 Dependence on renal dialysis: Secondary | ICD-10-CM | POA: Insufficient documentation

## 2017-04-09 DIAGNOSIS — I132 Hypertensive heart and chronic kidney disease with heart failure and with stage 5 chronic kidney disease, or end stage renal disease: Secondary | ICD-10-CM | POA: Insufficient documentation

## 2017-04-09 DIAGNOSIS — J45909 Unspecified asthma, uncomplicated: Secondary | ICD-10-CM | POA: Insufficient documentation

## 2017-04-09 DIAGNOSIS — E1122 Type 2 diabetes mellitus with diabetic chronic kidney disease: Secondary | ICD-10-CM | POA: Insufficient documentation

## 2017-04-09 DIAGNOSIS — I5032 Chronic diastolic (congestive) heart failure: Secondary | ICD-10-CM | POA: Diagnosis not present

## 2017-04-09 DIAGNOSIS — R040 Epistaxis: Secondary | ICD-10-CM | POA: Insufficient documentation

## 2017-04-09 DIAGNOSIS — Z87891 Personal history of nicotine dependence: Secondary | ICD-10-CM | POA: Insufficient documentation

## 2017-04-09 DIAGNOSIS — R52 Pain, unspecified: Principal | ICD-10-CM

## 2017-04-09 DIAGNOSIS — M5481 Occipital neuralgia: Principal | ICD-10-CM

## 2017-04-09 MED ORDER — HYDRALAZINE HCL 50 MG PO TABS: 50.0000 mg | ORAL_TABLET | ORAL | Status: DC

## 2017-04-09 MED ORDER — CLONIDINE HCL 0.1 MG PO TABS
0.1000 mg | ORAL_TABLET | Freq: Once | ORAL | Status: AC
  Administered 2017-04-09: 0.1 mg via ORAL
  Filled 2017-04-09: qty 1

## 2017-04-09 MED ORDER — OXYMETAZOLINE HCL 0.05 % NA SOLN
1.0000 | Freq: Once | NASAL | Status: AC
  Administered 2017-04-09: 1 via NASAL
  Filled 2017-04-09: qty 15

## 2017-04-09 MED ORDER — CLONIDINE HCL 0.1 MG PO TABS
0.2000 mg | ORAL_TABLET | Freq: Once | ORAL | Status: AC
  Administered 2017-04-09: 0.2 mg via ORAL
  Filled 2017-04-09: qty 2

## 2017-04-09 MED ORDER — TRANEXAMIC ACID 1000 MG/10ML IV SOLN
500.0000 mg | Freq: Once | INTRAVENOUS | Status: AC
  Administered 2017-04-09: 500 mg via TOPICAL
  Filled 2017-04-09: qty 10

## 2017-04-09 MED ORDER — HYDRALAZINE HCL 50 MG PO TABS
50.0000 mg | ORAL_TABLET | Freq: Once | ORAL | Status: AC
  Administered 2017-04-09: 50 mg via ORAL
  Filled 2017-04-09: qty 1

## 2017-04-09 NOTE — ED Provider Notes (Signed)
[facility] Emergency Department Provider Note  ____________________________________________  Time seen: Approximately 7:11 PM  I have reviewed the triage vital signs and the nursing notes.   HISTORY  Chief Complaint Epistaxis    HPI Jasmine Holloway is a 55 y.o. female complains of nosebleed that started  This afternoonat about 1 PM. Denies trauma. Denies fever or chills. Has had problems with this in the pastrelated to uncontrolled hypertension.  Denies cp/sob/back pain.  Does get HD T/TH/SA, last session yesterday, uncomplicated. Receives heparin during HD. No other bleeding sites.  No black/bloody stool. Bleeding is continuous since onset today. No aggravating/allev. Factors. Moderate intensity. No coughing up blood.      Past Medical History:  Diagnosis Date  . Asthma   . CHF (congestive heart failure) ([place])   . COPD (chronic obstructive pulmonary disease) ([place])   . Diabetes mellitus without complication ([place])   . Diastolic heart failure ([place])   . ESRD (end stage renal disease) on dialysis ([place])   . Hypertension   . Neuropathy   . Polycystic kidney disease   . Renal insufficiency      Patient Active Problem List   Diagnosis Date Noted  . Diabetes ([place]) 02/10/2017  . Esophageal stricture   . Hypoxia 11/10/2016  . Shortness of breath 10/26/2016  . COPD (chronic obstructive pulmonary disease) ([place]) 08/06/2016  . Hypertensive crisis 08/06/2016  . Atypical chest pain 06/08/2016  . Chronic pain syndrome 06/08/2016  . Anemia 06/08/2016  . CHF exacerbation ([place]) 04/28/2016  . Epigastric pain 03/28/2016  . Chronic diastolic heart failure ([place]) 02/04/2016  . Hoarseness of voice 02/04/2016  . Nicotine dependence, uncomplicated 16/05/9603  . ESRF (end stage renal failure) ([place]) 12/22/2015  . Essential hypertension 12/22/2015  . Mixed hyperlipidemia 12/22/2015  . Knee pain, left anterior 06/11/2015  . Arm pain, left 05/31/2015  . Pain of left arm  05/31/2015  . Closed bilateral ankle fractures, with routine healing, subsequent encounter 07/24/2014  . Closed bilateral ankle fractures, initial encounter 06/20/2014     Past Surgical History:  Procedure Laterality Date  . ARTERIOVENOUS GRAFT PLACEMENT Right    x3 (R forearm currently used for access)  . BREAST BIOPSY Left 12/03/2015   neg  . COLON SURGERY    . ESOPHAGOGASTRODUODENOSCOPY (EGD) WITH PROPOFOL N/A 02/04/2017   Procedure: ESOPHAGOGASTRODUODENOSCOPY (EGD) WITH PROPOFOL;  Surgeon: Jonathon Bellows, MD;  Location: [facility] ENDOSCOPY;  Service: Endoscopy;  Laterality: N/A;  . PERIPHERAL VASCULAR CATHETERIZATION Right 08/04/2016   Procedure: A/V Shuntogram;  Surgeon: Algernon Huxley, MD;  Location: [place] CV LAB;  Service: Cardiovascular;  Laterality: Right;  . VEIN HARVEST       Prior to Admission medications   Medication Sig Start Date End Date Taking? Authorizing Provider  acetaminophen (TYLENOL) 325 MG tablet Take 2 tablets (650 mg total) by mouth every 6 (six) hours as needed for mild pain or headache. 03/18/16   Gouru, Illene Silver, MD  amLODipine (NORVASC) 5 MG tablet Take 5 mg by mouth daily. 08/31/14   [provider]  aspirin 81 MG chewable tablet Chew 1 tablet (81 mg total) by mouth daily. 01/17/16   Gladstone Lighter, MD  b complex-vitamin c-folic acid (NEPHRO-VITE) 0.8 MG TABS tablet Take 1 tablet by mouth at bedtime.    [provider]  calcium acetate (PHOSLO) 667 MG capsule Take 1 capsule (667 mg total) by mouth 3 (three) times daily with meals. 02/02/17   Loletha Grayer, MD  cloNIDine (CATAPRES) 0.1 MG tablet Take  1 tablet (0.1 mg total) by mouth 2 (two) times daily. 01/17/16   Gladstone Lighter, MD  diphenhydrAMINE (BENADRYL) 25 mg capsule Take 2 capsules (50 mg total) by mouth every 6 (six) hours as needed. 10/16/16   Carrie Mew, MD  furosemide (LASIX) 80 MG tablet Take 1 tablet (80 mg total) by mouth daily. 03/05/16 11/10/17  Alisa Graff, FNP   gabapentin (NEURONTIN) 300 MG capsule Take 300 mg by mouth 2 (two) times daily.     [provider]  hydrALAZINE (APRESOLINE) 50 MG tablet Take 1 tablet (50 mg total) by mouth 3 (three) times daily. Patient not taking: Reported on 03/18/2017 02/02/17   Loletha Grayer, MD  lidocaine (XYLOCAINE) 5 % ointment Apply topically.    [provider]  LORazepam (ATIVAN) 1 MG tablet Take 1 tablet (1 mg total) by mouth 2 (two) times daily as needed for anxiety. Patient not taking: Reported on 03/18/2017 07/21/16   Daymon Larsen, MD  losartan (COZAAR) 25 MG tablet Take 1 tablet (25 mg total) by mouth daily. 02/08/17   [name] A, FNP  magnesium oxide (MAG-OX) 400 MG tablet Take 400 mg by mouth daily.    [provider]  metoprolol succinate (TOPROL-XL) 50 MG 24 hr tablet Take 50 mg by mouth 2 (two) times daily.  01/22/17   [provider]  nitroGLYCERIN (NITROSTAT) 0.4 MG SL tablet Place 1 tablet (0.4 mg total) under the tongue every 5 (five) minutes as needed for chest pain. 01/14/17 04/14/17  Minna Merritts, MD  omeprazole (PRILOSEC) 40 MG capsule Take by mouth daily.  12/20/16   [provider]  ondansetron (ZOFRAN) 4 MG tablet Take 1 tablet (4 mg total) by mouth every 8 (eight) hours as needed for nausea or vomiting. 07/21/16   Daymon Larsen, MD  oxyCODONE-acetaminophen (PERCOCET) 10-325 MG tablet Take 1 tablet by mouth daily as needed for pain.    [provider]  prochlorperazine (COMPAZINE) 10 MG tablet Take 1 tablet (10 mg total) by mouth every 6 (six) hours as needed for nausea. Patient not taking: Reported on 03/18/2017 10/15/16   Paulette Blanch, MD  rosuvastatin (CRESTOR) 5 MG tablet Take 5 mg by mouth daily.    [provider]  SENSIPAR 30 MG tablet Take 1 tablet (30 mg total) by mouth daily. 07/07/16   Fritzi Mandes, MD  VENTOLIN HFA 108 (90 Base) MCG/ACT inhaler Inhale 2 puffs into the lungs daily as needed.  05/21/16   [provider]     Allergies Morphine and related; Strawberry extract; Tramadol; Venofer [iron sucrose]; Vicodin [hydrocodone-acetaminophen]; Buprenorphine hcl; Codeine; and Morphine   Family History  Problem Relation Age of Onset  . Hypertension Brother   . Heart failure Brother   . Hypertension Mother   . Heart disease Mother     Social History Social History  Substance Use Topics  . Smoking status: Former Smoker    Packs/day: 0.25    Quit date: 08/11/2015  . Smokeless tobacco: Never Used  . Alcohol use No    Review of Systems  Constitutional:   No fever or chills.  ENT:   epistaxis as above Cardiovascular:   No chest pain or syncope. Respiratory:   No dyspnea or cough. Gastrointestinal:   Negative for abdominal pain, vomiting and diarrhea.  Musculoskeletal:   Negative for focal pain or swelling All other systems reviewed and are negative except as documented above in ROS and HPI.  ____________________________________________  PHYSICAL EXAM:  VITAL SIGNS: ED Triage Vitals  Enc Vitals Group     BP 04/09/17 1416 (!) 174/93     Pulse Rate 04/09/17 1416 70     Resp 04/09/17 1416 16     Temp 04/09/17 1416 97.9 F (36.6 C)     Temp Source 04/09/17 1416 Oral     SpO2 04/09/17 1416 93 %     Weight 04/09/17 1417 185 lb (83.9 kg)     Height 04/09/17 1417 5\' 3"  (1.6 m)     Head Circumference --      Peak Flow --      Pain Score 04/09/17 1605 0     Pain Loc --      Pain Edu? --      Excl. in [place]? --     Vital signs reviewed, nursing assessments reviewed.   Constitutional:   Alert and oriented. Well appearing and in no distress. Eyes:   No scleral icterus.  EOMI. No nystagmus. No conjunctival pallor. PERRL. ENT   Head:   Normocephalic and atraumatic.   Nose:   epistaxis from right nostril.    Mouth/Throat:   MMM, no pharyngeal erythema. No peritonsillar mass. no blood in the oropharynx.   Neck:   No meningismus. Full  ROM Hematological/Lymphatic/Immunilogical:   No cervical lymphadenopathy. Cardiovascular:   RRR. Symmetric bilateral radial and DP pulses.  No murmurs.  Respiratory:   Normal respiratory effort without tachypnea/retractions. Breath sounds are clear and equal bilaterally. No wheezes/rales/rhonchi. Gastrointestinal:   Soft and nontender. Non distended. There is no CVA tenderness.  No rebound, rigidity, or guarding. Genitourinary:   deferred Musculoskeletal:   Normal range of motion in all extremities. No joint effusions.  No lower extremity tenderness.  No edema. Neurologic:   Normal speech and language.  Motor grossly intact. No gross focal neurologic deficits are appreciated.  Skin:    Skin is warm, dry and intact. No rash noted.  No petechiae, purpura, or bullae.  ____________________________________________    LABS (pertinent positives/negatives) (all labs ordered are listed, but only abnormal results are displayed) Labs Reviewed - No data to display ____________________________________________   EKG    ____________________________________________    RADIOLOGY  No results found.  ____________________________________________   PROCEDURES .Epistaxis Management Date/Time: 04/09/2017 7:17 PM Performed by: Carrie Mew Authorized by: Carrie Mew   Consent:    Consent obtained:  Verbal   Consent given by:  Patient   Risks discussed:  Bleeding, infection and pain   Alternatives discussed:  No treatment Anesthesia (see MAR for exact dosages):    Anesthesia method:  None Procedure details:    Treatment site:  R anterior   Repair method: Afrin and tranexamic acid.   Treatment complexity:  Extensive   Treatment episode: initial   Post-procedure details:    Assessment:  Bleeding stopped   Patient tolerance of procedure:  Tolerated well, no immediate complications    ____________________________________________   INITIAL IMPRESSION / ASSESSMENT AND PLAN /  ED COURSE  Pertinent labs & imaging results that were available during my care of the patient were reviewed by me and considered in my medical decision making (see chart for details).    Clinical Course as of Apr 09 2016  Fri Apr 09, 2017  1545 Nose blown of clots. Clamp reapplied. Appears anterior. With HD/heparinization will use afrin and txa  [PS]  1729 60 minutes after TXA + afrin, pt remains hemostatic.   [PS]  1758 Recurrent nosebleed. Clearly anterior  on right naris.  Likely due to being overdue for hydralzine and bp 235/111.  Will give her hydralazine and her clonidine as well.  recommended anterior packing, patient refuses as it is very painful which she is aware from last time she had a nosebleed.Repeat txa/afrin applied.  [PS]  2014 Nosebleed stopped again. blood pressure control improving. Patient is suitable for discharge home, home care instructions for epistaxis, continue blood pressure management, follow-up with primary care doctor.  [PS]    Clinical Course User Index [PS] Carrie Mew, MD     ____________________________________________   FINAL CLINICAL IMPRESSION(S) / ED DIAGNOSES  Final diagnoses:  Right-sided epistaxis  Anterior epistaxis      New Prescriptions   No medications on file     Portions of this note were generated with dragon dictation software. Dictation errors may occur despite best attempts at proofreading.    Carrie Mew, MD 04/09/17 2017

## 2017-04-09 NOTE — ED Notes (Signed)
Pt left with ride before this RN had a chance to revitalize pt. Pt has already signed consent for DC and given DC paperwork and instructions.

## 2017-04-09 NOTE — ED Triage Notes (Signed)
Pt has had nosebleed for 40 min. Had last week and here and had to "have something put up my nose".  Blood draining posterior as well as anterior. No pain. Pt denies blood thinners but states "they put them in my dialysis machine".  Nose clamp applied.

## 2017-04-09 NOTE — ED Notes (Signed)
Pt with scant amount of bleeding persisting upon check. Pt's BP still up and MD Joni Fears made aware and gave verbal order for BP medication. Will continue to monitor nose and BP. Pt resting at this time in NAD.

## 2017-04-09 NOTE — ED Notes (Signed)
Pt is O2 dependant and niece is on way to pick up pt. Charge Nurse aware.

## 2017-04-09 NOTE — ED Notes (Signed)
Pt did have scant blood from right eye, same side as nosebleed. Discussed with doctor kinner and most likely from pressure placed with clamp.  No other orders at this time.

## 2017-04-10 ENCOUNTER — Encounter: Payer: Self-pay | Admitting: Emergency Medicine

## 2017-04-10 ENCOUNTER — Emergency Department
Admission: EM | Admit: 2017-04-10 | Discharge: 2017-04-10 | Disposition: A | Discharge status: Home / Self Care | Payer: Medicare Other | Attending: Emergency Medicine | Admitting: Emergency Medicine

## 2017-04-10 DIAGNOSIS — R040 Epistaxis: Secondary | ICD-10-CM | POA: Diagnosis present

## 2017-04-10 DIAGNOSIS — Z5321 Procedure and treatment not carried out due to patient leaving prior to being seen by health care provider: Secondary | ICD-10-CM | POA: Insufficient documentation

## 2017-04-10 NOTE — ED Triage Notes (Signed)
Pt arrived to ED via EMS from home. EMS reports she has a blood clot in the back of nose and is unable to sleep. Pt has been using nasal cannula in mouth instead of nose and pt sts "I got to get this blood clot out of the back of my nose cause I cant breath with this thing in here." Pt speaking loudly in triage room and able to complete sentences with ease. Pts airway clear and O2 on 2L by nasal canula, sat 97%.

## 2017-04-13 ENCOUNTER — Encounter: Payer: Self-pay | Admitting: Family

## 2017-04-13 ENCOUNTER — Ambulatory Visit: Payer: Medicare Other | Attending: Family | Admitting: Family

## 2017-04-13 VITALS — BP 173/86 | HR 72 | Resp 20 | Ht 63.0 in | Wt 188.2 lb

## 2017-04-13 DIAGNOSIS — Z992 Dependence on renal dialysis: Secondary | ICD-10-CM | POA: Insufficient documentation

## 2017-04-13 DIAGNOSIS — Q613 Polycystic kidney, unspecified: Secondary | ICD-10-CM | POA: Diagnosis not present

## 2017-04-13 DIAGNOSIS — R040 Epistaxis: Secondary | ICD-10-CM | POA: Diagnosis not present

## 2017-04-13 DIAGNOSIS — Z885 Allergy status to narcotic agent status: Secondary | ICD-10-CM | POA: Diagnosis not present

## 2017-04-13 DIAGNOSIS — I5032 Chronic diastolic (congestive) heart failure: Secondary | ICD-10-CM | POA: Insufficient documentation

## 2017-04-13 DIAGNOSIS — I132 Hypertensive heart and chronic kidney disease with heart failure and with stage 5 chronic kidney disease, or end stage renal disease: Secondary | ICD-10-CM | POA: Diagnosis present

## 2017-04-13 DIAGNOSIS — J441 Chronic obstructive pulmonary disease with (acute) exacerbation: Secondary | ICD-10-CM | POA: Diagnosis not present

## 2017-04-13 DIAGNOSIS — Z87891 Personal history of nicotine dependence: Secondary | ICD-10-CM | POA: Diagnosis not present

## 2017-04-13 DIAGNOSIS — Z7982 Long term (current) use of aspirin: Secondary | ICD-10-CM | POA: Diagnosis not present

## 2017-04-13 DIAGNOSIS — I10 Essential (primary) hypertension: Secondary | ICD-10-CM

## 2017-04-13 DIAGNOSIS — N186 End stage renal disease: Secondary | ICD-10-CM | POA: Insufficient documentation

## 2017-04-13 DIAGNOSIS — E1122 Type 2 diabetes mellitus with diabetic chronic kidney disease: Secondary | ICD-10-CM | POA: Diagnosis not present

## 2017-04-13 NOTE — Patient Instructions (Signed)
Continue weighing daily and call for an overnight weight gain of > 2 pounds or a weekly weight gain of >5 pounds. 
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
## 2017-04-24 ENCOUNTER — Other Ambulatory Visit
Admission: RE | Admit: 2017-04-24 | Discharge: 2017-04-24 | Disposition: A | Discharge status: Home / Self Care | Payer: Medicare Other | Source: Other Acute Inpatient Hospital | Attending: Nephrology | Admitting: Nephrology

## 2017-04-24 LAB — HEMOGLOBIN: Hemoglobin: 9.1 g/dL — ABNORMAL LOW (ref 12.0–16.0)

## 2017-05-11 ENCOUNTER — Ambulatory Visit: Payer: Medicare Other | Attending: Family | Admitting: Family

## 2017-05-11 ENCOUNTER — Encounter: Payer: Self-pay | Admitting: Family

## 2017-05-11 VITALS — BP 155/82 | HR 72 | Resp 20 | Ht 63.0 in | Wt 187.4 lb

## 2017-05-11 DIAGNOSIS — Z8249 Family history of ischemic heart disease and other diseases of the circulatory system: Secondary | ICD-10-CM | POA: Insufficient documentation

## 2017-05-11 DIAGNOSIS — Z7982 Long term (current) use of aspirin: Secondary | ICD-10-CM | POA: Insufficient documentation

## 2017-05-11 DIAGNOSIS — Q613 Polycystic kidney, unspecified: Secondary | ICD-10-CM | POA: Insufficient documentation

## 2017-05-11 DIAGNOSIS — N186 End stage renal disease: Secondary | ICD-10-CM | POA: Diagnosis not present

## 2017-05-11 DIAGNOSIS — I890 Lymphedema, not elsewhere classified: Secondary | ICD-10-CM | POA: Insufficient documentation

## 2017-05-11 DIAGNOSIS — J449 Chronic obstructive pulmonary disease, unspecified: Secondary | ICD-10-CM | POA: Diagnosis not present

## 2017-05-11 DIAGNOSIS — Z888 Allergy status to other drugs, medicaments and biological substances status: Secondary | ICD-10-CM | POA: Diagnosis not present

## 2017-05-11 DIAGNOSIS — Z79899 Other long term (current) drug therapy: Secondary | ICD-10-CM | POA: Diagnosis not present

## 2017-05-11 DIAGNOSIS — I132 Hypertensive heart and chronic kidney disease with heart failure and with stage 5 chronic kidney disease, or end stage renal disease: Secondary | ICD-10-CM | POA: Insufficient documentation

## 2017-05-11 DIAGNOSIS — I509 Heart failure, unspecified: Secondary | ICD-10-CM | POA: Diagnosis present

## 2017-05-11 DIAGNOSIS — I5032 Chronic diastolic (congestive) heart failure: Secondary | ICD-10-CM

## 2017-05-11 DIAGNOSIS — E1140 Type 2 diabetes mellitus with diabetic neuropathy, unspecified: Secondary | ICD-10-CM | POA: Insufficient documentation

## 2017-05-11 DIAGNOSIS — Z992 Dependence on renal dialysis: Secondary | ICD-10-CM | POA: Diagnosis not present

## 2017-05-11 DIAGNOSIS — Z87891 Personal history of nicotine dependence: Secondary | ICD-10-CM | POA: Diagnosis not present

## 2017-05-11 DIAGNOSIS — Z885 Allergy status to narcotic agent status: Secondary | ICD-10-CM | POA: Diagnosis not present

## 2017-05-11 DIAGNOSIS — E1122 Type 2 diabetes mellitus with diabetic chronic kidney disease: Secondary | ICD-10-CM | POA: Diagnosis not present

## 2017-05-11 DIAGNOSIS — I10 Essential (primary) hypertension: Secondary | ICD-10-CM

## 2017-05-11 NOTE — Patient Instructions (Signed)
Continue weighing daily and call for an overnight weight gain of > 2 pounds or a weekly weight gain of >5 pounds. 

## 2017-05-11 NOTE — Progress Notes (Addendum)
Subjective:    Patient ID: Jasmine Holloway, female    DOB: 29-Dec-1960, 56 y.o.   MRN: [id number]  HPI   Jasmine Holloway is a 56 y/o female with a history of asthma, DM, HTN, kidney disease (dialysis), COPD, previous tobacco use and chronic heart failure.   Reviewed echo report on 11/10/16 which showed an EF of 50-55% along with mild AR/MR. EF was 55-60% May 2017. Cardiac catheterization was done 03/17/16 and showed normal coronary arteries.   Was in the ED 04/10/17 but left without being seen. Was in the ED 04/09/17 and 03/29/17 with epistaxis. Both times she was was treated and released. Was in the ED 02/24/17 with shortness of breath. She was treated and release for her next day dialysis. Was in the ED 02/09/17 due to migraine and she was treated and released.  Admitted 02/01/17 with pulmonary edema. Initially needed bipap. BP was elevated initially. Elevated troponin thought to be due to demand ischemia. Continued with dialysis. Discharged the following day. Was in the ED 11/14/16 with chest pain. Treated and released. Admitted 11/10/16 with acute pulmonary edema and COPD exacerbation. Treated and discharged the following day.   She presents today for a follow-up visit with a chief complaint of chronic edema in lower legs with left >right. She describes this as having been present for several years with various levels of severity. She does feel like it's a little bit worse lately. Has associated fatigue, shortness of breath and difficulty sleeping along with this. Denies any chest pain, dizziness or weight gain.   Past Medical History:  Diagnosis Date  . Asthma   . CHF (congestive heart failure) ([place])   . COPD (chronic obstructive pulmonary disease) ([place])   . Diabetes mellitus without complication ([place])   . Diastolic heart failure ([place])   . ESRD (end stage renal disease) on dialysis ([place])   . Hypertension   . Neuropathy   . Polycystic kidney disease   . Renal insufficiency     Past Surgical History:   Procedure Laterality Date  . ARTERIOVENOUS GRAFT PLACEMENT Right    x3 (R forearm currently used for access)  . BREAST BIOPSY Left 12/03/2015   neg  . COLON SURGERY    . ESOPHAGOGASTRODUODENOSCOPY (EGD) WITH PROPOFOL N/A 02/04/2017   Procedure: ESOPHAGOGASTRODUODENOSCOPY (EGD) WITH PROPOFOL;  Surgeon: Jonathon Bellows, MD;  Location: [facility] ENDOSCOPY;  Service: Endoscopy;  Laterality: N/A;  . PERIPHERAL VASCULAR CATHETERIZATION Right 08/04/2016   Procedure: A/V Shuntogram;  Surgeon: Algernon Huxley, MD;  Location: [facility];  Service: Cardiovascular;  Laterality: Right;  . VEIN HARVEST      Family History  Problem Relation Age of Onset  . Hypertension Brother   . Heart failure Brother   . Hypertension Mother   . Heart disease Mother     Social History  Substance Use Topics  . Smoking status: Former Smoker    Packs/day: 0.25    Quit date: 08/11/2015  . Smokeless tobacco: Never Used  . Alcohol use No    Allergies  Allergen Reactions  . Morphine And Related Nausea And Vomiting  . Strawberry Extract   . Tramadol Nausea And Vomiting  . Venofer [Iron Sucrose]   . Vicodin [Hydrocodone-Acetaminophen] Nausea And Vomiting  . Buprenorphine Hcl Nausea And Vomiting  . Codeine Nausea And Vomiting  . Morphine Nausea And Vomiting    Halllucinations   Prior to Admission medications   Medication Sig Start Date End Date Taking? Authorizing Provider  acetaminophen (TYLENOL)  325 MG tablet Take 2 tablets (650 mg total) by mouth every 6 (six) hours as needed for mild pain or headache. 03/18/16  Yes Gouru, Aruna, MD  amitriptyline (ELAVIL) 10 MG tablet Take 10 mg by mouth at bedtime as needed for sleep.   Yes [provider]  amLODipine (NORVASC) 5 MG tablet Take 5 mg by mouth daily. Take 5mg  in the afternoon on dialysis days 08/31/14  Yes [provider]  aspirin 81 MG chewable tablet Chew 1 tablet (81 mg total) by mouth daily. 01/17/16  Yes Gladstone Lighter, MD  b  complex-vitamin c-folic acid (NEPHRO-VITE) 0.8 MG TABS tablet Take 1 tablet by mouth at bedtime.   Yes [provider]  calcium acetate (PHOSLO) 667 MG capsule Take 1 capsule (667 mg total) by mouth 3 (three) times daily with meals. 02/02/17  Yes Wieting, Richard, MD  cloNIDine (CATAPRES) 0.1 MG tablet Take 1 tablet (0.1 mg total) by mouth 2 (two) times daily. 01/17/16  Yes Gladstone Lighter, MD  diphenhydrAMINE (BENADRYL) 25 mg capsule Take 2 capsules (50 mg total) by mouth every 6 (six) hours as needed. 10/16/16  Yes Carrie Mew, MD  furosemide (LASIX) 80 MG tablet Take 1 tablet (80 mg total) by mouth daily. 03/05/16 11/10/17 Yes Hackney, Otila Kluver A, FNP  gabapentin (NEURONTIN) 300 MG capsule Take 300 mg by mouth daily.    Yes [provider]  hydrALAZINE (APRESOLINE) 50 MG tablet Take 1 tablet (50 mg total) by mouth 3 (three) times daily. 02/02/17  Yes Wieting, Richard, MD  lidocaine (XYLOCAINE) 5 % ointment Apply topically.   Yes [provider]  losartan (COZAAR) 25 MG tablet Take 1 tablet (25 mg total) by mouth daily. 02/08/17  Yes Hackney, Otila Kluver A, FNP  magnesium oxide (MAG-OX) 400 MG tablet Take 400 mg by mouth daily.   Yes [provider]  metoprolol succinate (TOPROL-XL) 50 MG 24 hr tablet Take 50 mg by mouth daily.  01/22/17  Yes [provider]  nitroGLYCERIN (NITROSTAT) 0.4 MG SL tablet Place 1 tablet (0.4 mg total) under the tongue every 5 (five) minutes as needed for chest pain. 01/14/17 05/11/17 Yes Gollan, Kathlene November, MD  omeprazole (PRILOSEC) 40 MG capsule Take by mouth daily.  12/20/16  Yes [provider]  ondansetron (ZOFRAN) 4 MG tablet Take 1 tablet (4 mg total) by mouth every 8 (eight) hours as needed for nausea or vomiting. 07/21/16  Yes Daymon Larsen, MD  oxyCODONE-acetaminophen (PERCOCET) 10-325 MG tablet Take 1 tablet by mouth daily as needed for pain.   Yes [provider]  prochlorperazine (COMPAZINE) 10 MG tablet Take 1  tablet (10 mg total) by mouth every 6 (six) hours as needed for nausea. 10/15/16  Yes Paulette Blanch, MD  SENSIPAR 30 MG tablet Take 1 tablet (30 mg total) by mouth daily. 07/07/16  Yes Fritzi Mandes, MD  VENTOLIN HFA 108 (90 Base) MCG/ACT inhaler Inhale 2 puffs into the lungs daily as needed.  05/21/16  Yes [provider]   Review of Systems  Constitutional: Positive for fatigue. Negative for appetite change.  HENT: Negative for congestion, nosebleeds, sinus pressure and sore throat.   Eyes: Positive for visual disturbance (blurry vision). Negative for pain.  Respiratory: Positive for shortness of breath. Negative for cough and chest tightness.   Cardiovascular: Positive for leg swelling. Negative for chest pain and palpitations.  Gastrointestinal: Negative for abdominal distention and abdominal pain.  Endocrine: Negative.   Genitourinary: Negative.   Musculoskeletal: Negative  for back pain and neck pain.  Skin: Negative.   Allergic/Immunologic: Negative.   Neurological: Positive for numbness (tingling down left arm at times due to previous graft) and headaches. Negative for dizziness and light-headedness.  Hematological: Negative for adenopathy. Does not bruise/bleed easily.  Psychiatric/Behavioral: Positive for sleep disturbance (sleeping on 2 pillows). Negative for dysphoric mood. The patient is not nervous/anxious.       Objective:   Physical Exam  Constitutional: She is oriented to person, place, and time. She appears well-developed and well-nourished.  HENT:  Head: Normocephalic and atraumatic.  Neck: Normal range of motion. Neck supple.  Cardiovascular: Normal rate and regular rhythm.   Pulmonary/Chest: Effort normal. She has no wheezes. She has no rales.  Abdominal: Soft. She exhibits no distension. There is no tenderness.  Musculoskeletal: She exhibits edema (2+ pitting edema in left lower leg; 1+ pitting in right lower leg.). She exhibits no tenderness.  Neurological:  She is alert and oriented to person, place, and time.  Skin: Skin is warm and dry.  Psychiatric: She has a normal mood and affect. Her behavior is normal. Thought content normal.  Nursing note and vitals reviewed.  Vitals:   05/11/17 1330  BP: (!) 155/82  Pulse: 72  Resp: 20  SpO2: 96%  Weight: 187 lb 6 oz (85 kg)  Height: 5\' 3"  (1.6 m)   Wt Readings from Last 3 Encounters:  05/11/17 187 lb 6 oz (85 kg)  04/13/17 188 lb 4 oz (85.4 kg)  04/10/17 185 lb (83.9 kg)    Lab Results  Component Value Date   CREATININE 11.30 (H) 02/24/2017   CREATININE 7.12 (H) 02/09/2017   CREATININE 10.44 (H) 02/02/2017      Assessment & Plan:  1: Chronic heart failure with preserved ejection fraction-   - NYHA class II - mildly fluid overloaded today but she hasn't taken her diuretic yet due to dialysis - weighing daily; Reminded to call for an overnight weight gain of >2 pounds or a weekly weight gain of >5 pounds; weight unchanged from the last time she was here - maintaining a 32 ounce fluid restriction per nephrology  - elevate legs when sitting for long periods of time to help decrease edema - wearing oxygent at 2L PRN - saw cardiologist Rockey Situ) 01/14/17 - not adding salt and is trying to eat low sodium foods. Reminded about the importance of following a 2000mg  sodium diet.  - does eat doughnuts, cookies and potato chips on occasion  2: HTN-  - BP looks good today - she hasn't taken any of her medications yet because she had dialysis and will be taking them as soon as she gets home  3.ESRD on dialysis-  - dialysis on T, TH, Sat - BMP from 02/24/17 reviewed and it shows sodium 143, potassium 5.5, creatinine 11.30 and GFR of 4  4: Lymphedema- - stage 2 - has worn compression socks but says that they "cut into" her leg and make her legs hurt - does elevate her legs numerous times during the day but her legs remain swollen - will make a referral for lymphapress compression boots - due to  dialysis 3 days/week, patient is unable to exercise consistently. When she does walk for exercise, her edema continues.  Patient did not bring her medications nor a list. Each medication was verbally reviewed with the patient and she was encouraged to bring the bottles to every visit to confirm accuracy of list.  Return in 3 months or sooner  for any questions/problems before then.

## 2017-05-26 ENCOUNTER — Ambulatory Visit: Admission: RE | Admit: 2017-05-26 | Discharge: 2017-05-26 | Disposition: A | Discharge status: Home / Self Care

## 2017-05-26 DIAGNOSIS — G894 Chronic pain syndrome: Principal | ICD-10-CM

## 2017-05-26 DIAGNOSIS — M792 Neuralgia and neuritis, unspecified: Secondary | ICD-10-CM

## 2017-05-26 MED ORDER — OXYCODONE-ACETAMINOPHEN 10 MG-325 MG TABLET
ORAL_TABLET | Freq: Every day | ORAL | 0 refills | 0.00000 days | Status: CP | PRN
Start: 2017-05-26 — End: 2017-08-25

## 2017-06-08 ENCOUNTER — Ambulatory Visit
Admission: RE | Admit: 2017-06-08 | Discharge: 2017-06-08 | Disposition: A | Discharge status: Home / Self Care | Payer: MEDICARE | Attending: Nephrology | Admitting: Nephrology

## 2017-06-11 DIAGNOSIS — N186 End stage renal disease: Secondary | ICD-10-CM

## 2017-06-11 DIAGNOSIS — Z01818 Encounter for other preprocedural examination: Secondary | ICD-10-CM

## 2017-06-11 DIAGNOSIS — Z7682 Awaiting organ transplant status: Principal | ICD-10-CM

## 2017-06-15 ENCOUNTER — Inpatient Hospital Stay
Admission: EM | Admit: 2017-06-15 | Discharge: 2017-06-17 | DRG: 291 | Disposition: A | Discharge status: Home / Self Care | Payer: Medicare Other | Attending: Internal Medicine | Admitting: Internal Medicine

## 2017-06-15 ENCOUNTER — Emergency Department: Payer: Medicare Other

## 2017-06-15 ENCOUNTER — Other Ambulatory Visit: Payer: Self-pay

## 2017-06-15 DIAGNOSIS — Z87891 Personal history of nicotine dependence: Secondary | ICD-10-CM | POA: Diagnosis not present

## 2017-06-15 DIAGNOSIS — Z992 Dependence on renal dialysis: Secondary | ICD-10-CM

## 2017-06-15 DIAGNOSIS — I132 Hypertensive heart and chronic kidney disease with heart failure and with stage 5 chronic kidney disease, or end stage renal disease: Principal | ICD-10-CM | POA: Diagnosis present

## 2017-06-15 DIAGNOSIS — E1142 Type 2 diabetes mellitus with diabetic polyneuropathy: Secondary | ICD-10-CM | POA: Diagnosis present

## 2017-06-15 DIAGNOSIS — R0902 Hypoxemia: Secondary | ICD-10-CM

## 2017-06-15 DIAGNOSIS — I509 Heart failure, unspecified: Secondary | ICD-10-CM

## 2017-06-15 DIAGNOSIS — Z7984 Long term (current) use of oral hypoglycemic drugs: Secondary | ICD-10-CM

## 2017-06-15 DIAGNOSIS — R059 Cough, unspecified: Secondary | ICD-10-CM

## 2017-06-15 DIAGNOSIS — E875 Hyperkalemia: Secondary | ICD-10-CM | POA: Diagnosis present

## 2017-06-15 DIAGNOSIS — N186 End stage renal disease: Secondary | ICD-10-CM | POA: Diagnosis present

## 2017-06-15 DIAGNOSIS — J441 Chronic obstructive pulmonary disease with (acute) exacerbation: Secondary | ICD-10-CM | POA: Diagnosis present

## 2017-06-15 DIAGNOSIS — I5033 Acute on chronic diastolic (congestive) heart failure: Secondary | ICD-10-CM | POA: Diagnosis present

## 2017-06-15 DIAGNOSIS — K219 Gastro-esophageal reflux disease without esophagitis: Secondary | ICD-10-CM | POA: Diagnosis present

## 2017-06-15 DIAGNOSIS — R7989 Other specified abnormal findings of blood chemistry: Secondary | ICD-10-CM

## 2017-06-15 DIAGNOSIS — N2581 Secondary hyperparathyroidism of renal origin: Secondary | ICD-10-CM | POA: Diagnosis present

## 2017-06-15 DIAGNOSIS — R778 Other specified abnormalities of plasma proteins: Secondary | ICD-10-CM

## 2017-06-15 DIAGNOSIS — R05 Cough: Secondary | ICD-10-CM

## 2017-06-15 DIAGNOSIS — Z7982 Long term (current) use of aspirin: Secondary | ICD-10-CM

## 2017-06-15 DIAGNOSIS — Z8249 Family history of ischemic heart disease and other diseases of the circulatory system: Secondary | ICD-10-CM | POA: Diagnosis not present

## 2017-06-15 DIAGNOSIS — D631 Anemia in chronic kidney disease: Secondary | ICD-10-CM | POA: Diagnosis present

## 2017-06-15 DIAGNOSIS — F419 Anxiety disorder, unspecified: Secondary | ICD-10-CM | POA: Diagnosis present

## 2017-06-15 DIAGNOSIS — J9621 Acute and chronic respiratory failure with hypoxia: Secondary | ICD-10-CM | POA: Diagnosis present

## 2017-06-15 DIAGNOSIS — R0602 Shortness of breath: Secondary | ICD-10-CM | POA: Diagnosis present

## 2017-06-15 DIAGNOSIS — E1122 Type 2 diabetes mellitus with diabetic chronic kidney disease: Secondary | ICD-10-CM | POA: Diagnosis present

## 2017-06-15 LAB — COMPREHENSIVE METABOLIC PANEL
ALT: 54 U/L (ref 14–54)
AST: 37 U/L (ref 15–41)
Albumin: 3.8 g/dL (ref 3.5–5.0)
Alkaline Phosphatase: 74 U/L (ref 38–126)
Anion gap: 11 (ref 5–15)
BILIRUBIN TOTAL: 0.8 mg/dL (ref 0.3–1.2)
BUN: 41 mg/dL — AB (ref 6–20)
CALCIUM: 8.4 mg/dL — AB (ref 8.9–10.3)
CO2: 29 mmol/L (ref 22–32)
CREATININE: 8.68 mg/dL — AB (ref 0.44–1.00)
Chloride: 97 mmol/L — ABNORMAL LOW (ref 101–111)
GFR, EST AFRICAN AMERICAN: 5 mL/min — AB (ref >60)
GFR, EST NON AFRICAN AMERICAN: 4 mL/min — AB (ref >60)
Glucose, Bld: 96 mg/dL (ref 65–99)
Potassium: 4.1 mmol/L (ref 3.5–5.1)
SODIUM: 137 mmol/L (ref 135–145)
Total Protein: 8.1 g/dL (ref 6.5–8.1)

## 2017-06-15 LAB — PROTIME-INR
INR: 1.12
PROTHROMBIN TIME: 14.3 s (ref 11.4–15.2)

## 2017-06-15 LAB — APTT: aPTT: 33 seconds (ref 24–36)

## 2017-06-15 LAB — CBC
HCT: 30.5 % — ABNORMAL LOW (ref 35.0–47.0)
Hemoglobin: 9.9 g/dL — ABNORMAL LOW (ref 12.0–16.0)
MCH: 29.1 pg (ref 26.0–34.0)
MCHC: 32.4 g/dL (ref 32.0–36.0)
MCV: 89.8 fL (ref 80.0–100.0)
PLATELETS: 270 10*3/uL (ref 150–440)
RBC: 3.39 MIL/uL — AB (ref 3.80–5.20)
RDW: 21.1 % — ABNORMAL HIGH (ref 11.5–14.5)
WBC: 6 10*3/uL (ref 3.6–11.0)

## 2017-06-15 LAB — TROPONIN I: Troponin I: 0.05 ng/mL (ref <0.03)

## 2017-06-15 LAB — BRAIN NATRIURETIC PEPTIDE: B Natriuretic Peptide: 1482 pg/mL — ABNORMAL HIGH (ref 0.0–100.0)

## 2017-06-15 LAB — MRSA PCR SCREENING: MRSA by PCR: NEGATIVE

## 2017-06-15 MED ORDER — FUROSEMIDE 10 MG/ML IJ SOLN
40.0000 mg | Freq: Two times a day (BID) | INTRAMUSCULAR | Status: DC
  Administered 2017-06-15 – 2017-06-17 (×4): 40 mg via INTRAVENOUS
  Filled 2017-06-15 (×3): qty 4

## 2017-06-15 MED ORDER — HYDRALAZINE HCL 20 MG/ML IJ SOLN: 10.0000 mg | INTRAMUSCULAR | Status: DC | PRN

## 2017-06-15 MED ORDER — IOPAMIDOL (ISOVUE-370) INJECTION 76%
75.0000 mL | Freq: Once | INTRAVENOUS | Status: AC | PRN
  Administered 2017-06-15: 75 mL via INTRAVENOUS

## 2017-06-15 MED ORDER — METHYLPREDNISOLONE SODIUM SUCC 125 MG IJ SOLR
INTRAMUSCULAR | Status: AC
  Filled 2017-06-15: qty 2

## 2017-06-15 MED ORDER — AMITRIPTYLINE HCL 10 MG PO TABS
10.0000 mg | ORAL_TABLET | Freq: Every evening | ORAL | Status: DC | PRN
  Filled 2017-06-15: qty 1

## 2017-06-15 MED ORDER — ONDANSETRON HCL 4 MG PO TABS: 4.0000 mg | ORAL_TABLET | Freq: Four times a day (QID) | ORAL | Status: DC | PRN

## 2017-06-15 MED ORDER — SODIUM CHLORIDE 0.9% FLUSH: 3.0000 mL | INTRAVENOUS | Status: DC | PRN

## 2017-06-15 MED ORDER — SODIUM CHLORIDE 0.9 % IV SOLN: 250.0000 mL | INTRAVENOUS | Status: DC | PRN

## 2017-06-15 MED ORDER — FUROSEMIDE 10 MG/ML IJ SOLN
INTRAMUSCULAR | Status: AC
  Administered 2017-06-15: 40 mg via INTRAVENOUS
  Filled 2017-06-15: qty 4

## 2017-06-15 MED ORDER — HYDRALAZINE HCL 50 MG PO TABS
ORAL_TABLET | ORAL | Status: AC
  Filled 2017-06-15: qty 2

## 2017-06-15 MED ORDER — METHYLPREDNISOLONE SODIUM SUCC 125 MG IJ SOLR
60.0000 mg | Freq: Four times a day (QID) | INTRAMUSCULAR | Status: DC
  Administered 2017-06-15 – 2017-06-17 (×6): 60 mg via INTRAVENOUS
  Filled 2017-06-15 (×6): qty 2

## 2017-06-15 MED ORDER — AMLODIPINE BESYLATE 5 MG PO TABS
5.0000 mg | ORAL_TABLET | Freq: Every day | ORAL | Status: DC
  Filled 2017-06-15: qty 1

## 2017-06-15 MED ORDER — OXYCODONE-ACETAMINOPHEN 5-325 MG PO TABS
1.0000 | ORAL_TABLET | Freq: Four times a day (QID) | ORAL | Status: DC | PRN
  Filled 2017-06-15: qty 1

## 2017-06-15 MED ORDER — LORAZEPAM 1 MG PO TABS
ORAL_TABLET | ORAL | Status: AC
  Filled 2017-06-15: qty 1

## 2017-06-15 MED ORDER — LORAZEPAM 0.5 MG PO TABS
0.5000 mg | ORAL_TABLET | Freq: Once | ORAL | Status: AC
  Administered 2017-06-15: 0.5 mg via ORAL
  Filled 2017-06-15: qty 1

## 2017-06-15 MED ORDER — ORAL CARE MOUTH RINSE
15.0000 mL | Freq: Two times a day (BID) | OROMUCOSAL | Status: DC
  Administered 2017-06-15 – 2017-06-16 (×2): 15 mL via OROMUCOSAL

## 2017-06-15 MED ORDER — CINACALCET HCL 30 MG PO TABS
30.0000 mg | ORAL_TABLET | Freq: Every day | ORAL | Status: DC
  Administered 2017-06-15 – 2017-06-16 (×2): 30 mg via ORAL
  Filled 2017-06-15 (×3): qty 1

## 2017-06-15 MED ORDER — PROCHLORPERAZINE MALEATE 10 MG PO TABS
10.0000 mg | ORAL_TABLET | Freq: Four times a day (QID) | ORAL | Status: DC | PRN
  Filled 2017-06-15: qty 1

## 2017-06-15 MED ORDER — HYDRALAZINE HCL 20 MG/ML IJ SOLN
10.0000 mg | Freq: Four times a day (QID) | INTRAMUSCULAR | Status: DC | PRN
  Administered 2017-06-15: 10 mg via INTRAVENOUS
  Filled 2017-06-15: qty 1

## 2017-06-15 MED ORDER — LABETALOL HCL 5 MG/ML IV SOLN
10.0000 mg | INTRAVENOUS | Status: DC | PRN
  Administered 2017-06-15: 10 mg via INTRAVENOUS
  Filled 2017-06-15: qty 4

## 2017-06-15 MED ORDER — NITROGLYCERIN 0.4 MG SL SUBL
0.4000 mg | SUBLINGUAL_TABLET | SUBLINGUAL | Status: DC | PRN
  Administered 2017-06-15 (×3): 0.4 mg via SUBLINGUAL
  Filled 2017-06-15: qty 1

## 2017-06-15 MED ORDER — LOSARTAN POTASSIUM 25 MG PO TABS
25.0000 mg | ORAL_TABLET | Freq: Every day | ORAL | Status: DC
  Filled 2017-06-15: qty 1

## 2017-06-15 MED ORDER — PANTOPRAZOLE SODIUM 40 MG PO TBEC: 40.0000 mg | DELAYED_RELEASE_TABLET | Freq: Every day | ORAL | Status: DC

## 2017-06-15 MED ORDER — ONDANSETRON HCL 4 MG/2ML IJ SOLN: 4.0000 mg | Freq: Four times a day (QID) | INTRAMUSCULAR | Status: DC | PRN

## 2017-06-15 MED ORDER — INSULIN ASPART 100 UNIT/ML ~~LOC~~ SOLN
0.0000 [IU] | Freq: Three times a day (TID) | SUBCUTANEOUS | Status: DC
  Administered 2017-06-16: 2 [IU] via SUBCUTANEOUS
  Administered 2017-06-16: 1 [IU] via SUBCUTANEOUS
  Administered 2017-06-17: 2 [IU] via SUBCUTANEOUS
  Filled 2017-06-15 (×3): qty 1

## 2017-06-15 MED ORDER — OXYCODONE HCL 5 MG PO TABS: 5.0000 mg | ORAL_TABLET | Freq: Four times a day (QID) | ORAL | Status: DC | PRN

## 2017-06-15 MED ORDER — HYDRALAZINE HCL 50 MG PO TABS
100.0000 mg | ORAL_TABLET | Freq: Four times a day (QID) | ORAL | Status: DC
  Administered 2017-06-15 – 2017-06-17 (×6): 100 mg via ORAL
  Filled 2017-06-15 (×6): qty 2

## 2017-06-15 MED ORDER — MAGNESIUM OXIDE 400 (241.3 MG) MG PO TABS
400.0000 mg | ORAL_TABLET | Freq: Every day | ORAL | Status: DC
  Administered 2017-06-15 – 2017-06-16 (×2): 400 mg via ORAL
  Filled 2017-06-15 (×2): qty 1

## 2017-06-15 MED ORDER — BUDESONIDE 0.25 MG/2ML IN SUSP
0.2500 mg | Freq: Two times a day (BID) | RESPIRATORY_TRACT | Status: DC
  Administered 2017-06-15 – 2017-06-17 (×4): 0.25 mg via RESPIRATORY_TRACT
  Filled 2017-06-15 (×4): qty 2

## 2017-06-15 MED ORDER — CALCIUM ACETATE (PHOS BINDER) 667 MG PO CAPS
667.0000 mg | ORAL_CAPSULE | Freq: Three times a day (TID) | ORAL | Status: DC
  Administered 2017-06-16 – 2017-06-17 (×3): 667 mg via ORAL
  Filled 2017-06-15 (×3): qty 1

## 2017-06-15 MED ORDER — RENA-VITE PO TABS
1.0000 | ORAL_TABLET | Freq: Every day | ORAL | Status: DC
  Administered 2017-06-15 – 2017-06-16 (×2): 1 via ORAL
  Filled 2017-06-15 (×3): qty 1

## 2017-06-15 MED ORDER — ASPIRIN 81 MG PO CHEW
324.0000 mg | CHEWABLE_TABLET | Freq: Once | ORAL | Status: AC
  Administered 2017-06-15: 324 mg via ORAL
  Filled 2017-06-15: qty 4

## 2017-06-15 MED ORDER — METOPROLOL SUCCINATE ER 50 MG PO TB24
50.0000 mg | ORAL_TABLET | Freq: Every day | ORAL | Status: DC
  Filled 2017-06-15: qty 1

## 2017-06-15 MED ORDER — PANTOPRAZOLE SODIUM 40 MG PO TBEC
40.0000 mg | DELAYED_RELEASE_TABLET | Freq: Every day | ORAL | Status: DC
  Administered 2017-06-15 – 2017-06-16 (×2): 40 mg via ORAL
  Filled 2017-06-15 (×2): qty 1

## 2017-06-15 MED ORDER — OXYCODONE-ACETAMINOPHEN 10-325 MG PO TABS: 1.0000 | ORAL_TABLET | Freq: Four times a day (QID) | ORAL | Status: DC | PRN

## 2017-06-15 MED ORDER — SODIUM CHLORIDE 0.9% FLUSH
3.0000 mL | Freq: Two times a day (BID) | INTRAVENOUS | Status: DC
  Administered 2017-06-15 – 2017-06-16 (×3): 3 mL via INTRAVENOUS

## 2017-06-15 MED ORDER — IPRATROPIUM-ALBUTEROL 0.5-2.5 (3) MG/3ML IN SOLN
3.0000 mL | Freq: Four times a day (QID) | RESPIRATORY_TRACT | Status: DC
  Administered 2017-06-15 – 2017-06-16 (×2): 3 mL via RESPIRATORY_TRACT
  Filled 2017-06-15 (×3): qty 3

## 2017-06-15 MED ORDER — HEPARIN SODIUM (PORCINE) 5000 UNIT/ML IJ SOLN
5000.0000 [IU] | Freq: Three times a day (TID) | INTRAMUSCULAR | Status: DC
  Administered 2017-06-15 – 2017-06-17 (×5): 5000 [IU] via SUBCUTANEOUS
  Filled 2017-06-15 (×5): qty 1

## 2017-06-15 MED ORDER — CLONIDINE HCL 0.1 MG PO TABS
0.3000 mg | ORAL_TABLET | Freq: Three times a day (TID) | ORAL | Status: DC
  Administered 2017-06-15 – 2017-06-16 (×3): 0.3 mg via ORAL
  Filled 2017-06-15 (×3): qty 3

## 2017-06-15 MED ORDER — GUAIFENESIN ER 600 MG PO TB12
600.0000 mg | ORAL_TABLET | Freq: Two times a day (BID) | ORAL | Status: DC
  Administered 2017-06-15 – 2017-06-16 (×3): 600 mg via ORAL
  Filled 2017-06-15 (×3): qty 1

## 2017-06-15 MED ORDER — LORAZEPAM 1 MG PO TABS
1.0000 mg | ORAL_TABLET | Freq: Four times a day (QID) | ORAL | Status: DC | PRN
  Administered 2017-06-15: 1 mg via ORAL
  Filled 2017-06-15: qty 1

## 2017-06-15 MED ORDER — IPRATROPIUM-ALBUTEROL 0.5-2.5 (3) MG/3ML IN SOLN: 3.0000 mL | RESPIRATORY_TRACT | Status: DC | PRN

## 2017-06-15 MED ORDER — ASPIRIN 81 MG PO CHEW
81.0000 mg | CHEWABLE_TABLET | Freq: Every day | ORAL | Status: DC
  Administered 2017-06-16: 81 mg via ORAL
  Filled 2017-06-15: qty 1

## 2017-06-15 MED ORDER — GABAPENTIN 300 MG PO CAPS
300.0000 mg | ORAL_CAPSULE | Freq: Every day | ORAL | Status: DC
  Administered 2017-06-15 – 2017-06-16 (×2): 300 mg via ORAL
  Filled 2017-06-15 (×2): qty 1

## 2017-06-15 MED ORDER — NITROGLYCERIN 2 % TD OINT
1.0000 [in_us] | TOPICAL_OINTMENT | Freq: Once | TRANSDERMAL | Status: AC
  Administered 2017-06-15: 1 [in_us] via TOPICAL
  Filled 2017-06-15: qty 1

## 2017-06-15 NOTE — ED Provider Notes (Signed)
[facility] Emergency Department Provider Note  ____________________________________________  Time seen: Approximately 4:21 PM  I have reviewed the triage vital signs and the nursing notes.   HISTORY  Chief Complaint Shortness of Breath    HPI Jasmine Holloway is a 56 y.o. female  a history of ESRD on HD, COPD on 3 L nasal cannula, CHF presenting with shortness of breath, chest pain, lower extremity swelling, and cough.  The patient reports that she regularly experiences severe cramping of the legs abdomen and hips, and was at dialysis when she had a typical episode and had to be disconnected from dialysis 30 minutes prior to completion "because they will not let you stay if he screams a lot."  She then went home and continued to have cramps, and then began to panic and get short of breath.  Additionally, she developed a central "squeezing" chest pain; she then developed a "sticking" pain with deep breaths..  She states that she regularly has chest pain, at least once weekly, which she treats with nitroglycerin, but she was unable to get the top of her pill bottle open today.  Her chest pain lasted approximately 5 minutes and resolve spontaneously, although at this point she reports some mild ongoing squeezing pain.  She had associated shortness of breath with her chest pain but denies any diaphoresis, nausea or vomiting, palpitations, lightheadedness or fainting.  She is recently had a cough, which she attributes to pulmonary edema and denies any associated congestion or rhinorrhea, sore throat or ear pain, fever or chills.   Past Medical History:  Diagnosis Date  . Asthma   . CHF (congestive heart failure) ([place])   . COPD (chronic obstructive pulmonary disease) ([place])   . Diabetes mellitus without complication ([place])   . Diastolic heart failure ([place])   . ESRD (end stage renal disease) on dialysis ([place])   . Hypertension   . Neuropathy   . Polycystic kidney disease   .  Renal insufficiency     Patient Active Problem List   Diagnosis Date Noted  . Lymphedema 05/11/2017  . Diabetes ([place]) 02/10/2017  . Esophageal stricture   . Hypoxia 11/10/2016  . Shortness of breath 10/26/2016  . COPD (chronic obstructive pulmonary disease) ([employer]) 08/06/2016  . Hypertensive crisis 08/06/2016  . Atypical chest pain 06/08/2016  . Chronic pain syndrome 06/08/2016  . Anemia 06/08/2016  . CHF exacerbation ([place]) 04/28/2016  . Epigastric pain 03/28/2016  . Chronic diastolic heart failure ([place]) 02/04/2016  . Hoarseness of voice 02/04/2016  . Nicotine dependence, uncomplicated 78/24/2353  . ESRF (end stage renal failure) ([place]) 12/22/2015  . Essential hypertension 12/22/2015  . Mixed hyperlipidemia 12/22/2015  . Knee pain, left anterior 06/11/2015  . Arm pain, left 05/31/2015  . Pain of left arm 05/31/2015  . Closed bilateral ankle fractures, with routine healing, subsequent encounter 07/24/2014  . Closed bilateral ankle fractures, initial encounter 06/20/2014    Past Surgical History:  Procedure Laterality Date  . ARTERIOVENOUS GRAFT PLACEMENT Right    x3 (R forearm currently used for access)  . BREAST BIOPSY Left 12/03/2015   neg  . COLON SURGERY    . VEIN HARVEST      Current Outpatient Rx  . Order #: [id number] Class: OTC  . Order #: [id number] Class: Historical Med  . Order #: [id number] Class: Historical Med  . Order #: [id number] Class: Normal  . Order #: [id number] Class: Historical Med  . Order #: [id number] Class: No Print  . Order #: [id number] Class: Normal  . Order #:  [id number] Class: Print  . Order #: [id number] Class: Normal  . Order #: [id number] Class: Historical Med  . Order #: [id number] Class: Print  . Order #: [id number] Class: Historical Med  . Order #: [id number] Class: Normal  . Order #: [id number] Class: Historical Med  . Order #: [id number] Class: Historical Med  . Order #: [id number] Class: Normal  . Order #: [id number] Class: Historical Med  . Order #:  [id number] Class: Print  . Order #: [id number] Class: Historical Med  . Order #: [id number] Class: Print  . Order #: [id number] Class: Normal  . Order #: [id number] Class: Historical Med    Allergies Morphine and related; Strawberry extract; Tramadol; Venofer [iron sucrose]; Vicodin [hydrocodone-acetaminophen]; Buprenorphine hcl; Codeine; and Morphine  Family History  Problem Relation Age of Onset  . Hypertension Brother   . Heart failure Brother   . Hypertension Mother   . Heart disease Mother     Social History Social History   Tobacco Use  . Smoking status: Former Smoker    Packs/day: 0.25    Last attempt to quit: 08/11/2015    Years since quitting: 1.8  . Smokeless tobacco: Never Used  Substance Use Topics  . Alcohol use: No    Alcohol/week: 0.0 oz  . Drug use: No    Review of Systems Constitutional: No fever/chills.  No lightheadedness or syncope. Eyes: No visual changes. ENT: No sore throat. No congestion or rhinorrhea. Cardiovascular: Denies chest pain. Denies palpitations. Respiratory: Denies shortness of breath.  No cough. Gastrointestinal: No abdominal pain.  No nausea, no vomiting.  No diarrhea.  No constipation. Genitourinary: Negative for dysuria. Musculoskeletal: Negative for back pain. Skin: Negative for rash. Neurological: Negative for headaches. No focal numbness, tingling or weakness.  Psychiatric:Positive panic attack.     ____________________________________________   PHYSICAL EXAM:  VITAL SIGNS: ED Triage Vitals  Enc Vitals Group     BP      Pulse      Resp      Temp      Temp src      SpO2      Weight      Height      Head Circumference      Peak Flow      Pain Score      Pain Loc      Pain Edu?      Excl. in [place]?     Constitutional: Alert and oriented. Well appearing and in no acute distress. Answers questions appropriately. Eyes: Conjunctivae are normal.  EOMI. No scleral icterus. Head: Atraumatic. Nose: No  congestion/rhinnorhea. Mouth/Throat: Mucous membranes are moist.  Neck: No stridor.  Supple.  + JVD. Cardiovascular: Normal rate, regular rhythm. No murmurs, rubs or gallops.  Respiratory: Mild tachypnea without any accessory muscle use or retractions.  Prolonged expiratory phase without wheezes rales or rhonchi. Gastrointestinal: Soft, nontender and nondistended.  No guarding or rebound.  No peritoneal signs. Musculoskeletal: Left greater than right pitting lower extremity edema to just below the knee bilaterally.. No ttp in the calves or palpable cords.  Negative Homan's sign. Neurologic:  A&Ox3.  Speech is clear.  Face and smile are symmetric.  EOMI.  Moves all extremities well. Skin:  Skin is warm, dry and intact. No rash noted. Psychiatric: Anxious affect with normal mood.  Normal speech and behavior are normal.  Normal judgement.  ____________________________________________   LABS (all labs ordered are listed, but only abnormal results are displayed)  Labs Reviewed  CBC - Abnormal; Notable for the following components:      Result Value  RBC 3.39 (*)    Hemoglobin 9.9 (*)    HCT 30.5 (*)    RDW 21.1 (*)    All other components within normal limits  COMPREHENSIVE METABOLIC PANEL - Abnormal; Notable for the following components:   Chloride 97 (*)    BUN 41 (*)    Creatinine, Ser 8.68 (*)    Calcium 8.4 (*)    GFR calc non Af Amer 4 (*)    GFR calc Af Amer 5 (*)    All other components within normal limits  BRAIN NATRIURETIC PEPTIDE - Abnormal; Notable for the following components:   B Natriuretic Peptide 1,482.0 (*)    All other components within normal limits  TROPONIN I - Abnormal; Notable for the following components:   Troponin I 0.05 (*)    All other components within normal limits  PROTIME-INR  APTT   ____________________________________________  EKG  ED ECG REPORT I, Eula Listen, the attending physician, personally viewed and interpreted this  ECG.   Date: 06/15/2017  EKG Time: 1604  Rate: 74  Rhythm: normal sinus rhythm  Axis: normal  Intervals:Prolonged Qtc  ST&T Change: No STEMI  ____________________________________________  RADIOLOGY  Dg Chest 2 View  Result Date: 06/15/2017 CLINICAL DATA:  Shortness of breath and lower extremity swelling earlier today. EXAM: CHEST  2 VIEW COMPARISON:  CT chest reported separately. FINDINGS: Cardiomegaly. Diffuse BILATERAL pulmonary opacity consistent with pulmonary edema. Vascular clips in the LEFT arm. Small RIGHT effusion, no pneumothorax. Bones unremarkable. IMPRESSION: Cardiomegaly with diffuse BILATERAL pulmonary opacities consistent with pulmonary edema. Electronically Signed   By: Staci Righter M.D.   On: 06/15/2017 17:38   Ct Angio Chest Pe W And/or Wo Contrast  Result Date: 06/15/2017 CLINICAL DATA:  Shortness of breath. Change in dialysis schedule for a vacation. Currently on 3 L nasal cannula with fat sat 94%. Left lower extremity swelling. Swelling started after walking around [place]. Dialysis for 7 years. EXAM: CT ANGIOGRAPHY CHEST WITH CONTRAST TECHNIQUE: Multidetector CT imaging of the chest was performed using the standard protocol during bolus administration of intravenous contrast. Multiplanar CT image reconstructions and MIPs were obtained to evaluate the vascular anatomy. CONTRAST:  75 cc Isovue 370 COMPARISON:  Chest x-ray 02/24/2017 FINDINGS: Cardiovascular: The heart is enlarged. There is no pericardial effusion. Minimal coronary artery calcification is present. No significant calcification of the thoracic aorta or aneurysmal dilatation. The pulmonary arteries are well opacified and there is no acute pulmonary embolus. There is a partially imaged left upper extremity thrombosed dialysis graft. Mediastinum/Nodes: The visualized portion of the thyroid gland has a normal appearance. Numerous enlarged mediastinal and hilar lymph nodes are present, showing progression  since the prior study. Prevascular lymph node is 2.2 x 1.7 cm. Right paratracheal lymph node is 2.1 cm. Subcarinal soft tissue density/ confluent lymphadenopathy is 3.2 x 2.0 cm. There is confluent adenopathy in the hilar regions, somewhat difficult to measure. Largest discrete node in the right hilar region is 1.4 cm. The esophagus is normal in appearance. No enlarged axillary lymph nodes. Lungs/Pleura: Small right pleural effusion. There are ground-glass opacities throughout the lungs, superimposed on upper lobe emphysematous changes. More confluent airspace filling opacities are identified throughout the lungs bilaterally and in there is mild dependent change in the posterior lower lobes. Findings are compatible with edema. Superimposed infectious process cannot be excluded radiographically. Upper Abdomen: Visualized portions of the liver and spleen are normal in appearance. Partially imaged cystic change in the left kidney compatible with known chronic  renal disease. Musculoskeletal: No chest wall abnormality. No acute or significant osseous findings. Review of the MIP images confirms the above findings. IMPRESSION: 1. Lung parenchymal changes compatible with pulmonary edema although superimposed infectious infiltrates could have a similar appearance. 2.  Emphysema (ICD10-J43.9) 3. Progression in mediastinal and hilar adenopathy, suspicious for inflammatory or infectious process versus lymphoproliferative or metastatic disease. Correlation with history and further workup are appropriate. 4. Cardiomegaly and coronary artery disease. 5. Partially imaged thrombosed left upper extremity dialysis graft. Electronically Signed   By: Nolon Nations M.D.   On: 06/15/2017 17:50    ____________________________________________   PROCEDURES  Procedure(s) performed: None  Procedures  Critical Care performed: No ____________________________________________   INITIAL IMPRESSION / ASSESSMENT AND PLAN / ED  COURSE  Pertinent labs & imaging results that were available during my care of the patient were reviewed by me and considered in my medical decision making (see chart for details).  56 y.o. F w/ ESRD on HD, COPD, and CHF presenting with shortness of breath, chest pain, and anxiety.  Overall, the patient is stable at this time and maintaining oxygen saturations in the mid 90s on her baseline 3 L.  It is possible that she has pulmonary edema from either her renal dysfunction or CHF exacerbation.  Her chest pain sounds like it is typical and unchanged in character or frequency, but we will also get a troponin to evaluate for ACS or MI.  A chest x-ray and laboratory studies are pending.  Plan aspirin for cardioprotection.  Plan nitroglycerin 2 treat her chest pain and decrease her blood pressure.  Plan reevaluation for final disposition.  ----------------------------------------- 5:41 PM on 06/15/2017 -----------------------------------------  ----------------------------------------- 5:41 PM on 06/15/2017 -----------------------------------------  The patient has a baseline unchanged anemia.  Her potassium is 4.1.  She does have a BNP of 1482 which is higher than her baseline with a troponin of 0.05.  It is possible that her troponin is elevated due to cardiac strain from acute CHF exacerbation.  ACS or MI is not excluded, but at this time, the patient is chest pain-free after sublingual nitroglycerin and heparinization is not indicated acutely.  The patient's lower extremity ultrasound and CT are pending at this time.  ----------------------------------------- 6:06 PM on 06/15/2017 -----------------------------------------  The patient's chest x-ray and CT are consistent with pulmonary edema; the reads cannot exclude an infectious process but this is much less likely given that the patient has not been having any fever, productive cough, and her white blood cell count is normal.  Pulmonary edema  is a much more likely diagnosis.  Given that she does not make urine, I am going to be unable to diurese her in the emergency department and she would need to be admitted for continued monitoring and potentially additional dialysis to remove intravascular fluid.  Throughout her emergency department stay, the patient has had several episodes where she becomes hypoxic into the high 80s on her home 3 L nasal cannula, but this does improve with positional changes or increasing her oxygen to 4 5 L.  The patient will be admitted for further evaluation and treatment at this time.  ____________________________________________  FINAL CLINICAL IMPRESSION(S) / ED DIAGNOSES  Final diagnoses:  Acute on chronic congestive heart failure, unspecified heart failure type (HCC)  Elevated troponin  Hypoxia  Cough         NEW MEDICATIONS STARTED DURING THIS VISIT:  This SmartLink is deprecated. Use AVSMEDLIST instead to display the medication list for a patient.  Eula Listen, MD 06/15/17 1807

## 2017-06-15 NOTE — ED Notes (Signed)
Patient transported to CT 

## 2017-06-15 NOTE — Progress Notes (Signed)
Patient arrived to 2A Room 234. Patient denies pain and all questions answered. Patient oriented to unit and Fall Safety Plan signed. Skin assessment completed with Vincente Liberty RN and skin intact. A&Ox4, VSS, and NSR on verified tele-box #[id number]. Nursing staff will continue to monitor for any changes in patient status. Earleen Reaper, RN

## 2017-06-15 NOTE — H&P (Signed)
[facility] NAME: Jasmine Holloway    MR#:  [id number]  DATE OF BIRTH:  08/14/1960  DATE OF ADMISSION:  06/15/2017  PRIMARY CARE PHYSICIAN: Theotis Burrow, MD   REQUESTING/REFERRING PHYSICIAN: Eula Listen  CHIEF COMPLAINT:   Chief Complaint  Patient presents with  . Shortness of Breath    HISTORY OF PRESENT ILLNESS: Jasmine Holloway  is a 56 y.o. female with a known history of COPD on 2 L of oxygen which she had read recently increased to 3 L, end-stage renal disease, CHF who is presenting with complaint of shortness of breath, chest pain and lower extremity swelling and cough ongoing for the past few days.  Patient reports that she could not complete her dialysis because of the symptoms today.  She had 30 minutes left.  She also complains of some anxiety.  She is noticed to have wheezing.  She underwent evaluation CT scan of the chest which showed findings consistent with CHF.     PAST MEDICAL HISTORY:   Past Medical History:  Diagnosis Date  . Asthma   . CHF (congestive heart failure) ([place])   . COPD (chronic obstructive pulmonary disease) ([place])   . Diabetes mellitus without complication ([place])   . Diastolic heart failure ([place])   . ESRD (end stage renal disease) on dialysis ([place])   . Hypertension   . Neuropathy   . Polycystic kidney disease   . Renal insufficiency     PAST SURGICAL HISTORY:  Past Surgical History:  Procedure Laterality Date  . ARTERIOVENOUS GRAFT PLACEMENT Right    x3 (R forearm currently used for access)  . BREAST BIOPSY Left 12/03/2015   neg  . COLON SURGERY    . VEIN HARVEST      SOCIAL HISTORY:  Social History   Tobacco Use  . Smoking status: Former Smoker    Packs/day: 0.25    Last attempt to quit: 08/11/2015    Years since quitting: 1.8  . Smokeless tobacco: Never Used  Substance Use Topics  . Alcohol use: No    Alcohol/week: 0.0 oz    FAMILY HISTORY:  Family History  Problem  Relation Age of Onset  . Hypertension Brother   . Heart failure Brother   . Hypertension Mother   . Heart disease Mother     DRUG ALLERGIES:  Allergies  Allergen Reactions  . Morphine And Related Nausea And Vomiting  . Strawberry Extract   . Tramadol Nausea And Vomiting  . Venofer [Iron Sucrose]   . Vicodin [Hydrocodone-Acetaminophen] Nausea And Vomiting  . Buprenorphine Hcl Nausea And Vomiting  . Codeine Nausea And Vomiting  . Morphine Nausea And Vomiting    Halllucinations    REVIEW OF SYSTEMS:   CONSTITUTIONAL: No fever, fatigue or weakness.  EYES: No blurred or double vision.  EARS, NOSE, AND THROAT: No tinnitus or ear pain.  RESPIRATORY: Positive cough, positive shortness of breath, positive wheezing or hemoptysis.  CARDIOVASCULAR: No chest pain, orthopnea, edema.  GASTROINTESTINAL: No nausea, vomiting, diarrhea or abdominal pain.  GENITOURINARY: No dysuria, hematuria.  ENDOCRINE: No polyuria, nocturia,  HEMATOLOGY: No anemia, easy bruising or bleeding SKIN: No rash or lesion. MUSCULOSKELETAL: No joint pain or arthritis.   NEUROLOGIC: No tingling, numbness, weakness.  PSYCHIATRY: No anxiety or depression.   MEDICATIONS AT HOME:  Prior to Admission medications   Medication Sig Start Date End Date Taking? Authorizing Provider  acetaminophen (TYLENOL) 325 MG tablet Take 2 tablets (650  mg total) by mouth every 6 (six) hours as needed for mild pain or headache. 03/18/16   Gouru, Illene Silver, MD  amitriptyline (ELAVIL) 10 MG tablet Take 10 mg by mouth at bedtime as needed for sleep.    [provider]  amLODipine (NORVASC) 5 MG tablet Take 5 mg by mouth daily. Take 5mg  in the afternoon on dialysis days 08/31/14   [provider]  aspirin 81 MG chewable tablet Chew 1 tablet (81 mg total) by mouth daily. 01/17/16   Gladstone Lighter, MD  b complex-vitamin c-folic acid (NEPHRO-VITE) 0.8 MG TABS tablet Take 1 tablet by mouth at bedtime.    [provider]   calcium acetate (PHOSLO) 667 MG capsule Take 1 capsule (667 mg total) by mouth 3 (three) times daily with meals. 02/02/17   Loletha Grayer, MD  cloNIDine (CATAPRES) 0.1 MG tablet Take 1 tablet (0.1 mg total) by mouth 2 (two) times daily. 01/17/16   Gladstone Lighter, MD  diphenhydrAMINE (BENADRYL) 25 mg capsule Take 2 capsules (50 mg total) by mouth every 6 (six) hours as needed. 10/16/16   Carrie Mew, MD  furosemide (LASIX) 80 MG tablet Take 1 tablet (80 mg total) by mouth daily. 03/05/16 11/10/17  Alisa Graff, FNP  gabapentin (NEURONTIN) 300 MG capsule Take 300 mg by mouth daily.     [provider]  hydrALAZINE (APRESOLINE) 50 MG tablet Take 1 tablet (50 mg total) by mouth 3 (three) times daily. 02/02/17   Loletha Grayer, MD  lidocaine (XYLOCAINE) 5 % ointment Apply topically.    [provider]  losartan (COZAAR) 25 MG tablet Take 1 tablet (25 mg total) by mouth daily. 02/08/17   [name] A, FNP  magnesium oxide (MAG-OX) 400 MG tablet Take 400 mg by mouth daily.    [provider]  metoprolol succinate (TOPROL-XL) 50 MG 24 hr tablet Take 50 mg by mouth daily.  01/22/17   [provider]  nitroGLYCERIN (NITROSTAT) 0.4 MG SL tablet Place 1 tablet (0.4 mg total) under the tongue every 5 (five) minutes as needed for chest pain. 01/14/17 05/11/17  Minna Merritts, MD  omeprazole (PRILOSEC) 40 MG capsule Take by mouth daily.  12/20/16   [provider]  ondansetron (ZOFRAN) 4 MG tablet Take 1 tablet (4 mg total) by mouth every 8 (eight) hours as needed for nausea or vomiting. 07/21/16   Daymon Larsen, MD  oxyCODONE-acetaminophen (PERCOCET) 10-325 MG tablet Take 1 tablet by mouth daily as needed for pain.    [provider]  prochlorperazine (COMPAZINE) 10 MG tablet Take 1 tablet (10 mg total) by mouth every 6 (six) hours as needed for nausea. 10/15/16   Paulette Blanch, MD  SENSIPAR 30 MG tablet Take 1 tablet (30 mg total) by mouth daily.  07/07/16   Fritzi Mandes, MD  VENTOLIN HFA 108 (90 Base) MCG/ACT inhaler Inhale 2 puffs into the lungs daily as needed.  05/21/16   [provider]      PHYSICAL EXAMINATION:   VITAL SIGNS: Blood pressure (!) 193/102, pulse 81, resp. rate (!) 26, SpO2 (!) 87 %.  GENERAL:  55 y.o.-year-old patient lying in the bed with no acute distress.  Appears anxious EYES: Pupils equal, round, reactive to light and accommodation. No scleral icterus. Extraocular muscles intact.  HEENT: Head atraumatic, normocephalic. Oropharynx and nasopharynx clear.  NECK:  Supple, no jugular venous distention. No thyroid enlargement, no tenderness.  LUNGS: Bilateral wheezing throughout both lungs.  CARDIOVASCULAR: S1, S2  normal. No murmurs, rubs, or gallops.  ABDOMEN: Soft, nontender, nondistended. Bowel sounds present. No organomegaly or mass.  EXTREMITIES: Bilateral pedal edema, cyanosis, or clubbing.  NEUROLOGIC: Cranial nerves II through XII are intact. Muscle strength 5/5 in all extremities. Sensation intact. Gait not checked.  PSYCHIATRIC: The patient is alert and oriented x 3.  SKIN: No obvious rash, lesion, or ulcer.   LABORATORY PANEL:   CBC Recent Labs  Lab 06/15/17 1630  WBC 6.0  HGB 9.9*  HCT 30.5*  PLT 270  MCV 89.8  MCH 29.1  MCHC 32.4  RDW 21.1*   ------------------------------------------------------------------------------------------------------------------  Chemistries  Recent Labs  Lab 06/15/17 1630  NA 137  K 4.1  CL 97*  CO2 29  GLUCOSE 96  BUN 41*  CREATININE 8.68*  CALCIUM 8.4*  AST 37  ALT 54  ALKPHOS 74  BILITOT 0.8   ------------------------------------------------------------------------------------------------------------------ CrCl cannot be calculated (Unknown ideal weight.). ------------------------------------------------------------------------------------------------------------------ No results for input(s): TSH, T4TOTAL, T3FREE, THYROIDAB in the  last 72 hours.  Invalid input(s): FREET3   Coagulation profile Recent Labs  Lab 06/15/17 1630  INR 1.12   ------------------------------------------------------------------------------------------------------------------- No results for input(s): DDIMER in the last 72 hours. -------------------------------------------------------------------------------------------------------------------  Cardiac Enzymes Recent Labs  Lab 06/15/17 1630  TROPONINI 0.05*   ------------------------------------------------------------------------------------------------------------------ Invalid input(s): POCBNP  ---------------------------------------------------------------------------------------------------------------  Urinalysis    Component Value Date/Time   COLORURINE YELLOW (A) 10/16/2016 1311   APPEARANCEUR CLOUDY (A) 10/16/2016 1311   LABSPEC 1.008 10/16/2016 1311   PHURINE 9.0 (H) 10/16/2016 1311   GLUCOSEU 150 (A) 10/16/2016 1311   HGBUR NEGATIVE 10/16/2016 1311   BILIRUBINUR NEGATIVE 10/16/2016 1311   KETONESUR NEGATIVE 10/16/2016 1311   PROTEINUR >=300 (A) 10/16/2016 1311   NITRITE NEGATIVE 10/16/2016 1311   LEUKOCYTESUR SMALL (A) 10/16/2016 1311     RADIOLOGY: Dg Chest 2 View  Result Date: 06/15/2017 CLINICAL DATA:  Shortness of breath and lower extremity swelling earlier today. EXAM: CHEST  2 VIEW COMPARISON:  CT chest reported separately. FINDINGS: Cardiomegaly. Diffuse BILATERAL pulmonary opacity consistent with pulmonary edema. Vascular clips in the LEFT arm. Small RIGHT effusion, no pneumothorax. Bones unremarkable. IMPRESSION: Cardiomegaly with diffuse BILATERAL pulmonary opacities consistent with pulmonary edema. Electronically Signed   By: Staci Righter M.D.   On: 06/15/2017 17:38   Ct Angio Chest Pe W And/or Wo Contrast  Result Date: 06/15/2017 CLINICAL DATA:  Shortness of breath. Change in dialysis schedule for a vacation. Currently on 3 L nasal cannula with fat  sat 94%. Left lower extremity swelling. Swelling started after walking around [place]. Dialysis for 7 years. EXAM: CT ANGIOGRAPHY CHEST WITH CONTRAST TECHNIQUE: Multidetector CT imaging of the chest was performed using the standard protocol during bolus administration of intravenous contrast. Multiplanar CT image reconstructions and MIPs were obtained to evaluate the vascular anatomy. CONTRAST:  75 cc Isovue 370 COMPARISON:  Chest x-ray 02/24/2017 FINDINGS: Cardiovascular: The heart is enlarged. There is no pericardial effusion. Minimal coronary artery calcification is present. No significant calcification of the thoracic aorta or aneurysmal dilatation. The pulmonary arteries are well opacified and there is no acute pulmonary embolus. There is a partially imaged left upper extremity thrombosed dialysis graft. Mediastinum/Nodes: The visualized portion of the thyroid gland has a normal appearance. Numerous enlarged mediastinal and hilar lymph nodes are present, showing progression since the prior study. Prevascular lymph node is 2.2 x 1.7 cm. Right paratracheal lymph node is 2.1 cm. Subcarinal soft tissue density/ confluent lymphadenopathy is 3.2 x 2.0 cm. There is confluent adenopathy in  the hilar regions, somewhat difficult to measure. Largest discrete node in the right hilar region is 1.4 cm. The esophagus is normal in appearance. No enlarged axillary lymph nodes. Lungs/Pleura: Small right pleural effusion. There are ground-glass opacities throughout the lungs, superimposed on upper lobe emphysematous changes. More confluent airspace filling opacities are identified throughout the lungs bilaterally and in there is mild dependent change in the posterior lower lobes. Findings are compatible with edema. Superimposed infectious process cannot be excluded radiographically. Upper Abdomen: Visualized portions of the liver and spleen are normal in appearance. Partially imaged cystic change in the left kidney  compatible with known chronic renal disease. Musculoskeletal: No chest wall abnormality. No acute or significant osseous findings. Review of the MIP images confirms the above findings. IMPRESSION: 1. Lung parenchymal changes compatible with pulmonary edema although superimposed infectious infiltrates could have a similar appearance. 2.  Emphysema (ICD10-J43.9) 3. Progression in mediastinal and hilar adenopathy, suspicious for inflammatory or infectious process versus lymphoproliferative or metastatic disease. Correlation with history and further workup are appropriate. 4. Cardiomegaly and coronary artery disease. 5. Partially imaged thrombosed left upper extremity dialysis graft. Electronically Signed   By: Nolon Nations M.D.   On: 06/15/2017 17:50   US Venous Img Lower Bilateral  Result Date: 06/15/2017 CLINICAL DATA:  Lower extremity swelling for 3 days EXAM: BILATERAL LOWER EXTREMITY VENOUS DOPPLER ULTRASOUND TECHNIQUE: Gray-scale sonography with graded compression, as well as color Doppler and duplex ultrasound were performed to evaluate the lower extremity deep venous systems from the level of the common femoral vein and including the common femoral, femoral, profunda femoral, popliteal and calf veins including the posterior tibial, peroneal and gastrocnemius veins when visible. The superficial great saphenous vein was also interrogated. Spectral Doppler was utilized to evaluate flow at rest and with distal augmentation maneuvers in the common femoral, femoral and popliteal veins. COMPARISON:  LEFT lower extremity venous ultrasound of 04/29/2016 FINDINGS: RIGHT LOWER EXTREMITY Common Femoral Vein: No evidence of thrombus. Normal compressibility, respiratory phasicity and response to augmentation. Saphenofemoral Junction: No evidence of thrombus. Normal compressibility and flow on color Doppler imaging. Profunda Femoral Vein: No evidence of thrombus. Normal compressibility and flow on color Doppler  imaging. Femoral Vein: No evidence of thrombus. Normal compressibility, respiratory phasicity and response to augmentation. Popliteal Vein: No evidence of thrombus. Normal compressibility, respiratory phasicity and response to augmentation. Calf Veins: No evidence of thrombus. Normal compressibility and flow on color Doppler imaging. Superficial Great Saphenous Vein: No evidence of thrombus. Normal compressibility. Venous Reflux:  None. Other Findings:  None. LEFT LOWER EXTREMITY Common Femoral Vein: No evidence of thrombus. Normal compressibility, respiratory phasicity and response to augmentation. Saphenofemoral Junction: No evidence of thrombus. Normal compressibility and flow on color Doppler imaging. Profunda Femoral Vein: No evidence of thrombus. Normal compressibility and flow on color Doppler imaging. Femoral Vein: No evidence of thrombus. Normal compressibility, respiratory phasicity and response to augmentation. Popliteal Vein: No evidence of thrombus. Normal compressibility, respiratory phasicity and response to augmentation. Calf Veins: No evidence of thrombus. Normal compressibility and flow on color Doppler imaging. Superficial Great Saphenous Vein: No evidence of thrombus. Normal compressibility. Venous Reflux:  None. Other Findings:  None. IMPRESSION: No evidence of deep venous thrombosis in either lower extremity. Electronically Signed   By: Lavonia Dana M.D.   On: 06/15/2017 18:07    EKG: Orders placed or performed during the hospital encounter of 03/29/17  . EKG 12-Lead  . EKG 12-Lead    IMPRESSION AND PLAN: Patient is a 56 year old  with complaint of shortness of breath  1.  Acute on chronic diastolic CHF I will treat patient with IV Lasix Nephrology consult for hemodialysis tomorrow  2.  Acute on chronic COPD exacerbation We will place her on nebulizer therapy IV Solu-Medrol Pulmicort nebs Oxygen therapy  3.  Accelerated hypertension I will increase her home clonidine and  hydralazine dose I will place her on IV hydralazine as needed  4.  End-stage renal disease Nephrology consult  5.  Diabetes type 2 place her on sliding scale insulin  6.  Miscellaneous heparin for DVT prophylaxis    All the records are reviewed and case discussed with ED provider. Management plans discussed with the patient, family and they are in agreement.  CODE STATUS: Code Status History    Date Active Date Inactive Code Status Order ID Comments User Context   02/01/2017 07:47 02/02/2017 17:40 Full Code [id number]  Loletha Grayer, MD ED   11/10/2016 08:21 11/11/2016 17:54 Full Code [id number]  Harvie Bridge, DO Inpatient   07/04/2016 05:09 07/07/2016 20:31 Full Code [id number]  Mikael Spray, NP ED   06/09/2016 04:58 06/10/2016 19:32 Full Code [id number]  Awilda Bill, NP ED   05/11/2016 10:24 05/12/2016 18:26 Full Code [id number]  Vaughan Basta, MD Inpatient   04/28/2016 20:30 04/30/2016 17:49 Full Code [id number]  Gladstone Lighter, MD Inpatient   03/28/2016 18:25 03/29/2016 17:26 Full Code [id number]  Vaughan Basta, MD ED   03/17/2016 06:06 03/18/2016 07:51 Full Code [id number]  Saundra Shelling, MD ED   01/10/2016 21:11 01/17/2016 21:36 Full Code [id number]  Mikael Spray, NP ED   12/21/2015 06:08 12/22/2015 16:09 Full Code [id number]  Saundra Shelling, MD Inpatient   02/24/2015 03:52 02/24/2015 16:15 Full Code [id number]  Harrie Foreman, MD Inpatient       TOTAL TIME TAKING CARE OF THIS PATIENT: 55 minutes.    Dustin Flock M.D on 06/15/2017 at 7:28 PM  Between 7am to 6pm - Pager - [phone]  After 6pm go to [email or web address] - password [email or web address] [facility]  Office  [phone]  CC: Primary care physician; Theotis Burrow, MD

## 2017-06-15 NOTE — ED Notes (Signed)
RN received call from [facility] 0.05. EDP Mariea Clonts made aware.

## 2017-06-15 NOTE — ED Triage Notes (Signed)
Pt presents today via [facility] for [facility]. Pt receives Dialysis on Tues,Thurs, Sat, But has has a change in her tx as she went on Vacation. Pt last tx was received today about half the time. Pt is currently on 3L [place] and stating 94%. Pt presents with LLE swelling not pitting.Pt states the swelling started after walking around [place]. Pt is A/O.

## 2017-06-16 LAB — GLUCOSE, CAPILLARY
GLUCOSE-CAPILLARY: 150 mg/dL — AB (ref 65–99)
GLUCOSE-CAPILLARY: 184 mg/dL — AB (ref 65–99)
GLUCOSE-CAPILLARY: 139 mg/dL — AB (ref 65–99)

## 2017-06-16 LAB — CBC
HEMATOCRIT: 29.6 % — AB (ref 35.0–47.0)
HEMOGLOBIN: 9.7 g/dL — AB (ref 12.0–16.0)
MCH: 28.9 pg (ref 26.0–34.0)
MCHC: 32.6 g/dL (ref 32.0–36.0)
MCV: 88.7 fL (ref 80.0–100.0)
Platelets: 260 10*3/uL (ref 150–440)
RBC: 3.34 MIL/uL — AB (ref 3.80–5.20)
RDW: 21.1 % — ABNORMAL HIGH (ref 11.5–14.5)
WBC: 5.3 10*3/uL (ref 3.6–11.0)

## 2017-06-16 LAB — BASIC METABOLIC PANEL
Anion gap: 11 (ref 5–15)
BUN: 55 mg/dL — ABNORMAL HIGH (ref 6–20)
CHLORIDE: 99 mmol/L — AB (ref 101–111)
CO2: 26 mmol/L (ref 22–32)
Calcium: 8 mg/dL — ABNORMAL LOW (ref 8.9–10.3)
Creatinine, Ser: 9.67 mg/dL — ABNORMAL HIGH (ref 0.44–1.00)
GFR calc non Af Amer: 4 mL/min — ABNORMAL LOW (ref >60)
GFR, EST AFRICAN AMERICAN: 5 mL/min — AB (ref >60)
Glucose, Bld: 139 mg/dL — ABNORMAL HIGH (ref 65–99)
POTASSIUM: 6 mmol/L — AB (ref 3.5–5.1)
SODIUM: 136 mmol/L (ref 135–145)

## 2017-06-16 MED ORDER — EPOETIN ALFA 10000 UNIT/ML IJ SOLN
10000.0000 [IU] | INTRAMUSCULAR | Status: DC
  Administered 2017-06-16: 10000 [IU] via INTRAVENOUS

## 2017-06-16 MED ORDER — ACETAMINOPHEN 325 MG PO TABS
650.0000 mg | ORAL_TABLET | Freq: Four times a day (QID) | ORAL | Status: DC | PRN
  Administered 2017-06-16: 650 mg via ORAL
  Filled 2017-06-16: qty 2

## 2017-06-16 MED ORDER — IPRATROPIUM-ALBUTEROL 0.5-2.5 (3) MG/3ML IN SOLN
3.0000 mL | RESPIRATORY_TRACT | Status: DC
  Administered 2017-06-16 – 2017-06-17 (×4): 3 mL via RESPIRATORY_TRACT
  Filled 2017-06-16 (×11): qty 3

## 2017-06-16 MED ORDER — CYCLOBENZAPRINE HCL 10 MG PO TABS
5.0000 mg | ORAL_TABLET | Freq: Every day | ORAL | Status: DC | PRN
  Administered 2017-06-16: 5 mg via ORAL
  Filled 2017-06-16: qty 1

## 2017-06-16 NOTE — Progress Notes (Signed)
Post HD assessment  

## 2017-06-16 NOTE — Progress Notes (Signed)
HD tx end  

## 2017-06-16 NOTE — Progress Notes (Signed)
Notified MD Marcille Blanco of AM Potassium level 6.0; no new orders at this time as patient may go for dialysis today and will pass along to oncoming shift. Patient currently sleeping comfortably. Nursing staff will continue to monitor for any changes in patient status. Earleen Reaper, RN

## 2017-06-16 NOTE — Progress Notes (Signed)
Pt called about still having some muscle spasms after receiving PRN medication.  I gave the patient some warm wash cloths to put on both hands and neck. She stated that helped a little. Will continue to monitor patient.

## 2017-06-16 NOTE — Progress Notes (Signed)
Paged MD about pt still complaining of muscle spasm and notifying there wasn't any order to redraw lab for potassium. Verbal orders received and put in. Will continue to monitor patient.

## 2017-06-16 NOTE — Care Management (Addendum)
Notified Elvera Bicker with [facility] of admission. Chronic oxygen through [facility]

## 2017-06-16 NOTE — Progress Notes (Signed)
[facility] NAME: Jasmine Holloway    MR#:  [id number]  DATE OF BIRTH:  06-Jun-1961  SUBJECTIVE:  CHIEF COMPLAINT:   Chief Complaint  Patient presents with  . Shortness of Breath   Patient seen in dialysis.  Continues to have shortness of breath, wheezing and cough.  On 4 L oxygen. Missed hemodialysis last week with a trip to [place].  REVIEW OF SYSTEMS:    Review of Systems  Constitutional: Positive for malaise/fatigue. Negative for chills and fever.  HENT: Negative for sore throat.   Eyes: Negative for blurred vision, double vision and pain.  Respiratory: Positive for cough, shortness of breath and wheezing. Negative for hemoptysis.   Cardiovascular: Positive for orthopnea. Negative for chest pain, palpitations and leg swelling.  Gastrointestinal: Negative for abdominal pain, constipation, diarrhea, heartburn, nausea and vomiting.  Genitourinary: Negative for dysuria and hematuria.  Musculoskeletal: Negative for back pain and joint pain.  Skin: Negative for rash.  Neurological: Positive for weakness. Negative for sensory change, speech change, focal weakness and headaches.  Endo/Heme/Allergies: Does not bruise/bleed easily.  Psychiatric/Behavioral: Negative for depression. The patient is not nervous/anxious.     DRUG ALLERGIES:   Allergies  Allergen Reactions  . Morphine And Related Nausea And Vomiting  . Strawberry Extract   . Tramadol Nausea And Vomiting  . Venofer [Iron Sucrose]   . Vicodin [Hydrocodone-Acetaminophen] Nausea And Vomiting  . Buprenorphine Hcl Nausea And Vomiting  . Codeine Nausea And Vomiting  . Morphine Nausea And Vomiting    Halllucinations    VITALS:  Blood pressure (!) 164/95, pulse 79, temperature 97.9 F (36.6 C), temperature source Oral, resp. rate (!) 24, height 5\' 3"  (1.6 m), weight 90.6 kg (199 lb 11.8 oz), SpO2 92 %.  PHYSICAL EXAMINATION:   Physical Exam  GENERAL:  56  y.o.-year-old patient lying in the bed with no acute distress.  EYES: Pupils equal, round, reactive to light and accommodation. No scleral icterus. Extraocular muscles intact.  HEENT: Head atraumatic, normocephalic. Oropharynx and nasopharynx clear.  NECK:  Supple, no jugular venous distention. No thyroid enlargement, no tenderness.  LUNGS: b/ wheezing and crackles CARDIOVASCULAR: S1, S2 normal. No murmurs, rubs, or gallops.  ABDOMEN: Soft, nontender, nondistended. Bowel sounds present. No organomegaly or mass.  EXTREMITIES: No cyanosis, clubbing or edema b/l.    NEUROLOGIC: Cranial nerves II through XII are intact. No focal Motor or sensory deficits b/l.   PSYCHIATRIC: The patient is alert and oriented x 3.  SKIN: No obvious rash, lesion, or ulcer.   LABORATORY PANEL:   CBC Recent Labs  Lab 06/16/17 0351  WBC 5.3  HGB 9.7*  HCT 29.6*  PLT 260   ------------------------------------------------------------------------------------------------------------------ Chemistries  Recent Labs  Lab 06/15/17 1630 06/16/17 0351  NA 137 136  K 4.1 6.0*  CL 97* 99*  CO2 29 26  GLUCOSE 96 139*  BUN 41* 55*  CREATININE 8.68* 9.67*  CALCIUM 8.4* 8.0*  AST 37  --   ALT 54  --   ALKPHOS 74  --   BILITOT 0.8  --    ------------------------------------------------------------------------------------------------------------------  Cardiac Enzymes Recent Labs  Lab 06/15/17 1630  TROPONINI 0.05*   ------------------------------------------------------------------------------------------------------------------  RADIOLOGY:  Dg Chest 2 View  Result Date: 06/15/2017 CLINICAL DATA:  Shortness of breath and lower extremity swelling earlier today. EXAM: CHEST  2 VIEW COMPARISON:  CT chest reported separately. FINDINGS: Cardiomegaly. Diffuse BILATERAL pulmonary opacity consistent with pulmonary edema. Vascular clips in the  LEFT arm. Small RIGHT effusion, no pneumothorax. Bones unremarkable.  IMPRESSION: Cardiomegaly with diffuse BILATERAL pulmonary opacities consistent with pulmonary edema. Electronically Signed   By: Staci Righter M.D.   On: 06/15/2017 17:38   Ct Angio Chest Pe W And/or Wo Contrast  Result Date: 06/15/2017 CLINICAL DATA:  Shortness of breath. Change in dialysis schedule for a vacation. Currently on 3 L nasal cannula with fat sat 94%. Left lower extremity swelling. Swelling started after walking around [place]. Dialysis for 7 years. EXAM: CT ANGIOGRAPHY CHEST WITH CONTRAST TECHNIQUE: Multidetector CT imaging of the chest was performed using the standard protocol during bolus administration of intravenous contrast. Multiplanar CT image reconstructions and MIPs were obtained to evaluate the vascular anatomy. CONTRAST:  75 cc Isovue 370 COMPARISON:  Chest x-ray 02/24/2017 FINDINGS: Cardiovascular: The heart is enlarged. There is no pericardial effusion. Minimal coronary artery calcification is present. No significant calcification of the thoracic aorta or aneurysmal dilatation. The pulmonary arteries are well opacified and there is no acute pulmonary embolus. There is a partially imaged left upper extremity thrombosed dialysis graft. Mediastinum/Nodes: The visualized portion of the thyroid gland has a normal appearance. Numerous enlarged mediastinal and hilar lymph nodes are present, showing progression since the prior study. Prevascular lymph node is 2.2 x 1.7 cm. Right paratracheal lymph node is 2.1 cm. Subcarinal soft tissue density/ confluent lymphadenopathy is 3.2 x 2.0 cm. There is confluent adenopathy in the hilar regions, somewhat difficult to measure. Largest discrete node in the right hilar region is 1.4 cm. The esophagus is normal in appearance. No enlarged axillary lymph nodes. Lungs/Pleura: Small right pleural effusion. There are ground-glass opacities throughout the lungs, superimposed on upper lobe emphysematous changes. More confluent airspace filling opacities  are identified throughout the lungs bilaterally and in there is mild dependent change in the posterior lower lobes. Findings are compatible with edema. Superimposed infectious process cannot be excluded radiographically. Upper Abdomen: Visualized portions of the liver and spleen are normal in appearance. Partially imaged cystic change in the left kidney compatible with known chronic renal disease. Musculoskeletal: No chest wall abnormality. No acute or significant osseous findings. Review of the MIP images confirms the above findings. IMPRESSION: 1. Lung parenchymal changes compatible with pulmonary edema although superimposed infectious infiltrates could have a similar appearance. 2.  Emphysema (ICD10-J43.9) 3. Progression in mediastinal and hilar adenopathy, suspicious for inflammatory or infectious process versus lymphoproliferative or metastatic disease. Correlation with history and further workup are appropriate. 4. Cardiomegaly and coronary artery disease. 5. Partially imaged thrombosed left upper extremity dialysis graft. Electronically Signed   By: Nolon Nations M.D.   On: 06/15/2017 17:50   US Venous Img Lower Bilateral  Result Date: 06/15/2017 CLINICAL DATA:  Lower extremity swelling for 3 days EXAM: BILATERAL LOWER EXTREMITY VENOUS DOPPLER ULTRASOUND TECHNIQUE: Gray-scale sonography with graded compression, as well as color Doppler and duplex ultrasound were performed to evaluate the lower extremity deep venous systems from the level of the common femoral vein and including the common femoral, femoral, profunda femoral, popliteal and calf veins including the posterior tibial, peroneal and gastrocnemius veins when visible. The superficial great saphenous vein was also interrogated. Spectral Doppler was utilized to evaluate flow at rest and with distal augmentation maneuvers in the common femoral, femoral and popliteal veins. COMPARISON:  LEFT lower extremity venous ultrasound of 04/29/2016 FINDINGS:  RIGHT LOWER EXTREMITY Common Femoral Vein: No evidence of thrombus. Normal compressibility, respiratory phasicity and response to augmentation. Saphenofemoral Junction: No evidence of thrombus. Normal compressibility  and flow on color Doppler imaging. Profunda Femoral Vein: No evidence of thrombus. Normal compressibility and flow on color Doppler imaging. Femoral Vein: No evidence of thrombus. Normal compressibility, respiratory phasicity and response to augmentation. Popliteal Vein: No evidence of thrombus. Normal compressibility, respiratory phasicity and response to augmentation. Calf Veins: No evidence of thrombus. Normal compressibility and flow on color Doppler imaging. Superficial Great Saphenous Vein: No evidence of thrombus. Normal compressibility. Venous Reflux:  None. Other Findings:  None. LEFT LOWER EXTREMITY Common Femoral Vein: No evidence of thrombus. Normal compressibility, respiratory phasicity and response to augmentation. Saphenofemoral Junction: No evidence of thrombus. Normal compressibility and flow on color Doppler imaging. Profunda Femoral Vein: No evidence of thrombus. Normal compressibility and flow on color Doppler imaging. Femoral Vein: No evidence of thrombus. Normal compressibility, respiratory phasicity and response to augmentation. Popliteal Vein: No evidence of thrombus. Normal compressibility, respiratory phasicity and response to augmentation. Calf Veins: No evidence of thrombus. Normal compressibility and flow on color Doppler imaging. Superficial Great Saphenous Vein: No evidence of thrombus. Normal compressibility. Venous Reflux:  None. Other Findings:  None. IMPRESSION: No evidence of deep venous thrombosis in either lower extremity. Electronically Signed   By: Lavonia Dana M.D.   On: 06/15/2017 18:07     ASSESSMENT AND PLAN:   Patient is a 56 year old with complaint of shortness of breath  *  Acute on chronic diastolic CHF  IV Lasix Patient getting hemodialysis  today.  * Acute on chronic COPD exacerbation  nebulizer therapy IV Solu-Medrol Pulmicort nebs Oxygen therapy  *Acute on chronic respiratory failure due to CHF and COPD.  *  Accelerated hypertension Increased home dose of clonidine and hydralazine.  *  End-stage renal disease Nephrology consulted.  Discussed with Dr. Juleen China  *  Diabetes type 2 place her on sliding scale insulin   * DVT prophylaxis - heparin  All the records are reviewed and case discussed with Care Management/Social Worker Management plans discussed with the patient, family and they are in agreement.  CODE STATUS: FULL CODE  DVT Prophylaxis: SCDs  TOTAL TIME TAKING CARE OF THIS PATIENT: 35 minutes.   POSSIBLE D/C IN 1-2 DAYS, DEPENDING ON CLINICAL CONDITION.  Hillary Bow R M.D on 06/16/2017 at 12:29 PM  Between 7am to 6pm - Pager - [phone]  After 6pm go to [email or web address] - password [email or web address] [facility]  Office  [phone]  CC: Primary care physician; Theotis Burrow, MD  Note: This dictation was prepared with Dragon dictation along with smaller phrase technology. Any transcriptional errors that result from this process are unintentional.

## 2017-06-16 NOTE — Progress Notes (Signed)
[facility]  ROUNDING NOTE   Subjective:   Jasmine Holloway admitted to [facility] on 06/15/2017 for Cough [R05] Hypoxia [R09.02] Elevated troponin [R74.8] Acute on chronic congestive heart failure, unspecified heart failure type [facility]) [I50.9]  Patient had partial dialysis treatment yesterday. Placed on dialysis due to volume overload. Tolerating treatment well. UF goal 3.5 liters.     HEMODIALYSIS FLOWSHEET:  Blood Flow Rate (mL/min): 400 mL/min Arterial Pressure (mmHg): -120 mmHg Venous Pressure (mmHg): 250 mmHg Transmembrane Pressure (mmHg): 70 mmHg Ultrafiltration Rate (mL/min): 720 mL/min Dialysate Flow Rate (mL/min): 600 ml/min Conductivity: Machine : 13.7 Conductivity: Machine : 13.7 Dialysis Fluid Bolus: Normal Saline Bolus Amount (mL): 250 mL    Objective:  Vital signs in last 24 hours:  Temp:  [97.6 F (36.4 C)-97.9 F (36.6 C)] 97.9 F (36.6 C) (11/07 0935) Pulse Rate:  [71-108] 81 (11/07 1243) Resp:  [16-32] 24 (11/07 1243) BP: (142-198)/(82-116) 162/91 (11/07 1243) SpO2:  [85 %-100 %] 98 % (11/07 1243) FiO2 (%):  [5 %] 5 % (11/06 1946) Weight:  [85.4 kg (188 lb 3.2 oz)-90.6 kg (199 lb 11.8 oz)] 90.6 kg (199 lb 11.8 oz) (11/07 0935)  Weight change:  Filed Weights   06/15/17 1949 06/16/17 0322 06/16/17 0935  Weight: 85.5 kg (188 lb 6.4 oz) 85.4 kg (188 lb 3.2 oz) 90.6 kg (199 lb 11.8 oz)    Intake/Output: I/O last 3 completed shifts: In: -  Out: 100 [Urine:100]   Intake/Output this shift:  Total I/O In: 240 [P.O.:240] Out: -   Physical Exam: General: NAD,   Head: Normocephalic, atraumatic. Moist oral mucosal membranes  Eyes: Anicteric, PERRL  Neck: Supple, trachea midline  Lungs:  Clear to auscultation  Heart: Regular rate and rhythm  Abdomen:  Soft, nontender,   Extremities: trace peripheral edema.  Neurologic: Nonfocal, moving all four extremities  Skin: No lesions  Access: Right forearm AVG    Basic Metabolic Panel: Recent  Labs  Lab 06/15/17 1630 06/16/17 0351  NA 137 136  K 4.1 6.0*  CL 97* 99*  CO2 29 26  GLUCOSE 96 139*  BUN 41* 55*  CREATININE 8.68* 9.67*  CALCIUM 8.4* 8.0*    Liver Function Tests: Recent Labs  Lab 06/15/17 1630  AST 37  ALT 54  ALKPHOS 74  BILITOT 0.8  PROT 8.1  ALBUMIN 3.8   No results for input(s): LIPASE, AMYLASE in the last 168 hours. No results for input(s): AMMONIA in the last 168 hours.  CBC: Recent Labs  Lab 06/15/17 1630 06/16/17 0351  WBC 6.0 5.3  HGB 9.9* 9.7*  HCT 30.5* 29.6*  MCV 89.8 88.7  PLT 270 260    Cardiac Enzymes: Recent Labs  Lab 06/15/17 1630  TROPONINI 0.05*    BNP: Invalid input(s): POCBNP  CBG: Recent Labs  Lab 06/16/17 0715  GLUCAP 150*    Microbiology: Results for orders placed or performed during the hospital encounter of 06/15/17  MRSA PCR Screening     Status: None   Collection Time: 06/15/17  8:08 PM  Result Value Ref Range Status   MRSA by PCR NEGATIVE NEGATIVE Final    Comment:        The GeneXpert MRSA Assay (FDA approved for NASAL specimens only), is one component of a comprehensive MRSA colonization surveillance program. It is not intended to diagnose MRSA infection nor to guide or monitor treatment for MRSA infections.     Coagulation Studies: Recent Labs    06/15/17 1630  LABPROT 14.3  INR 1.12    Urinalysis: No results for input(s): COLORURINE, LABSPEC, PHURINE, GLUCOSEU, HGBUR, BILIRUBINUR, KETONESUR, PROTEINUR, UROBILINOGEN, NITRITE, LEUKOCYTESUR in the last 72 hours.  Invalid input(s): APPERANCEUR    Imaging: Dg Chest 2 View  Result Date: 06/15/2017 CLINICAL DATA:  Shortness of breath and lower extremity swelling earlier today. EXAM: CHEST  2 VIEW COMPARISON:  CT chest reported separately. FINDINGS: Cardiomegaly. Diffuse BILATERAL pulmonary opacity consistent with pulmonary edema. Vascular clips in the LEFT arm. Small RIGHT effusion, no pneumothorax. Bones unremarkable.  IMPRESSION: Cardiomegaly with diffuse BILATERAL pulmonary opacities consistent with pulmonary edema. Electronically Signed   By: Staci Righter M.D.   On: 06/15/2017 17:38   Ct Angio Chest Pe W And/or Wo Contrast  Result Date: 06/15/2017 CLINICAL DATA:  Shortness of breath. Change in dialysis schedule for a vacation. Currently on 3 L nasal cannula with fat sat 94%. Left lower extremity swelling. Swelling started after walking around [place]. Dialysis for 7 years. EXAM: CT ANGIOGRAPHY CHEST WITH CONTRAST TECHNIQUE: Multidetector CT imaging of the chest was performed using the standard protocol during bolus administration of intravenous contrast. Multiplanar CT image reconstructions and MIPs were obtained to evaluate the vascular anatomy. CONTRAST:  75 cc Isovue 370 COMPARISON:  Chest x-ray 02/24/2017 FINDINGS: Cardiovascular: The heart is enlarged. There is no pericardial effusion. Minimal coronary artery calcification is present. No significant calcification of the thoracic aorta or aneurysmal dilatation. The pulmonary arteries are well opacified and there is no acute pulmonary embolus. There is a partially imaged left upper extremity thrombosed dialysis graft. Mediastinum/Nodes: The visualized portion of the thyroid gland has a normal appearance. Numerous enlarged mediastinal and hilar lymph nodes are present, showing progression since the prior study. Prevascular lymph node is 2.2 x 1.7 cm. Right paratracheal lymph node is 2.1 cm. Subcarinal soft tissue density/ confluent lymphadenopathy is 3.2 x 2.0 cm. There is confluent adenopathy in the hilar regions, somewhat difficult to measure. Largest discrete node in the right hilar region is 1.4 cm. The esophagus is normal in appearance. No enlarged axillary lymph nodes. Lungs/Pleura: Small right pleural effusion. There are ground-glass opacities throughout the lungs, superimposed on upper lobe emphysematous changes. More confluent airspace filling opacities  are identified throughout the lungs bilaterally and in there is mild dependent change in the posterior lower lobes. Findings are compatible with edema. Superimposed infectious process cannot be excluded radiographically. Upper Abdomen: Visualized portions of the liver and spleen are normal in appearance. Partially imaged cystic change in the left kidney compatible with known chronic renal disease. Musculoskeletal: No chest wall abnormality. No acute or significant osseous findings. Review of the MIP images confirms the above findings. IMPRESSION: 1. Lung parenchymal changes compatible with pulmonary edema although superimposed infectious infiltrates could have a similar appearance. 2.  Emphysema (ICD10-J43.9) 3. Progression in mediastinal and hilar adenopathy, suspicious for inflammatory or infectious process versus lymphoproliferative or metastatic disease. Correlation with history and further workup are appropriate. 4. Cardiomegaly and coronary artery disease. 5. Partially imaged thrombosed left upper extremity dialysis graft. Electronically Signed   By: Nolon Nations M.D.   On: 06/15/2017 17:50   US Venous Img Lower Bilateral  Result Date: 06/15/2017 CLINICAL DATA:  Lower extremity swelling for 3 days EXAM: BILATERAL LOWER EXTREMITY VENOUS DOPPLER ULTRASOUND TECHNIQUE: Gray-scale sonography with graded compression, as well as color Doppler and duplex ultrasound were performed to evaluate the lower extremity deep venous systems from the level of the common femoral vein and including the common femoral, femoral, profunda femoral, popliteal and  calf veins including the posterior tibial, peroneal and gastrocnemius veins when visible. The superficial great saphenous vein was also interrogated. Spectral Doppler was utilized to evaluate flow at rest and with distal augmentation maneuvers in the common femoral, femoral and popliteal veins. COMPARISON:  LEFT lower extremity venous ultrasound of 04/29/2016 FINDINGS:  RIGHT LOWER EXTREMITY Common Femoral Vein: No evidence of thrombus. Normal compressibility, respiratory phasicity and response to augmentation. Saphenofemoral Junction: No evidence of thrombus. Normal compressibility and flow on color Doppler imaging. Profunda Femoral Vein: No evidence of thrombus. Normal compressibility and flow on color Doppler imaging. Femoral Vein: No evidence of thrombus. Normal compressibility, respiratory phasicity and response to augmentation. Popliteal Vein: No evidence of thrombus. Normal compressibility, respiratory phasicity and response to augmentation. Calf Veins: No evidence of thrombus. Normal compressibility and flow on color Doppler imaging. Superficial Great Saphenous Vein: No evidence of thrombus. Normal compressibility. Venous Reflux:  None. Other Findings:  None. LEFT LOWER EXTREMITY Common Femoral Vein: No evidence of thrombus. Normal compressibility, respiratory phasicity and response to augmentation. Saphenofemoral Junction: No evidence of thrombus. Normal compressibility and flow on color Doppler imaging. Profunda Femoral Vein: No evidence of thrombus. Normal compressibility and flow on color Doppler imaging. Femoral Vein: No evidence of thrombus. Normal compressibility, respiratory phasicity and response to augmentation. Popliteal Vein: No evidence of thrombus. Normal compressibility, respiratory phasicity and response to augmentation. Calf Veins: No evidence of thrombus. Normal compressibility and flow on color Doppler imaging. Superficial Great Saphenous Vein: No evidence of thrombus. Normal compressibility. Venous Reflux:  None. Other Findings:  None. IMPRESSION: No evidence of deep venous thrombosis in either lower extremity. Electronically Signed   By: Lavonia Dana M.D.   On: 06/15/2017 18:07     Medications:   . sodium chloride     . amLODipine  5 mg Oral Daily  . aspirin  81 mg Oral Daily  . budesonide (PULMICORT) nebulizer solution  0.25 mg Nebulization  BID  . calcium acetate  667 mg Oral TID WC  . cinacalcet  30 mg Oral Daily  . cloNIDine  0.3 mg Oral TID  . epoetin (EPOGEN/PROCRIT) injection  10,000 Units Intravenous Q M,W,F-HD  . furosemide  40 mg Intravenous Q12H  . gabapentin  300 mg Oral Daily  . guaiFENesin  600 mg Oral BID  . heparin  5,000 Units Subcutaneous Q8H  . hydrALAZINE  100 mg Oral Q6H  . insulin aspart  0-9 Units Subcutaneous TID WC  . ipratropium-albuterol  3 mL Nebulization Q4H  . losartan  25 mg Oral Daily  . magnesium oxide  400 mg Oral Daily  . mouth rinse  15 mL Mouth Rinse BID  . methylPREDNISolone (SOLU-MEDROL) injection  60 mg Intravenous Q6H  . metoprolol succinate  50 mg Oral Daily  . multivitamin  1 tablet Oral QHS  . pantoprazole  40 mg Oral Daily  . sodium chloride flush  3 mL Intravenous Q12H   sodium chloride, acetaminophen, amitriptyline, hydrALAZINE, labetalol, LORazepam, nitroGLYCERIN, ondansetron **OR** ondansetron (ZOFRAN) IV, oxyCODONE-acetaminophen **AND** oxyCODONE, prochlorperazine, sodium chloride flush  Assessment/ Plan:  Ms. Mala Gibbard is a 56 y.o. 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 Right AVG. EDW 84kg.   1. End Stage Renal Disease: with hyperkalemia Seen and examined on hemodialysis. Tolerating treatment. Extra treatment today due to hyperkalemia and volume overload.  Next treatment for tomorrow and then resume TTS schedule.  2. Hypertension: with diastolic congestive heart failure  - Low salt diet, fluid restriction - amlodipine, clonidine, furosemide, hydralazine, losartan  3. Anemia of chronic kidney disease: Hemoglobin 9.7 - EPO with HD treatment  4. Secondary Hyperparathyroidism:  - Calcium acetate - cinacalcet.     LOS: [place], [place] 11/7/201812:57 PM

## 2017-06-16 NOTE — Progress Notes (Signed)
Pre HD assessment  

## 2017-06-16 NOTE — Progress Notes (Signed)
HD tx start 

## 2017-06-16 NOTE — Progress Notes (Signed)
Pt complaining of muscle spasms in both hands and neck. MD paged, verbal orders received. Will continue to monitor patient.

## 2017-06-17 LAB — RENAL FUNCTION PANEL
Albumin: 3.4 g/dL — ABNORMAL LOW (ref 3.5–5.0)
Anion gap: 17 — ABNORMAL HIGH (ref 5–15)
BUN: 60 mg/dL — AB (ref 6–20)
CALCIUM: 8.7 mg/dL — AB (ref 8.9–10.3)
CHLORIDE: 91 mmol/L — AB (ref 101–111)
CO2: 25 mmol/L (ref 22–32)
CREATININE: 8.69 mg/dL — AB (ref 0.44–1.00)
GFR calc Af Amer: 5 mL/min — ABNORMAL LOW (ref >60)
GFR, EST NON AFRICAN AMERICAN: 4 mL/min — AB (ref >60)
Glucose, Bld: 186 mg/dL — ABNORMAL HIGH (ref 65–99)
Phosphorus: 3.6 mg/dL (ref 2.5–4.6)
Potassium: 4.7 mmol/L (ref 3.5–5.1)
SODIUM: 133 mmol/L — AB (ref 135–145)

## 2017-06-17 LAB — CBC
HCT: 31.7 % — ABNORMAL LOW (ref 35.0–47.0)
Hemoglobin: 10.4 g/dL — ABNORMAL LOW (ref 12.0–16.0)
MCH: 29.3 pg (ref 26.0–34.0)
MCHC: 32.7 g/dL (ref 32.0–36.0)
MCV: 89.6 fL (ref 80.0–100.0)
PLATELETS: 261 10*3/uL (ref 150–440)
RBC: 3.54 MIL/uL — ABNORMAL LOW (ref 3.80–5.20)
RDW: 20.7 % — AB (ref 11.5–14.5)
WBC: 7.6 10*3/uL (ref 3.6–11.0)

## 2017-06-17 LAB — GLUCOSE, CAPILLARY: GLUCOSE-CAPILLARY: 167 mg/dL — AB (ref 65–99)

## 2017-06-17 LAB — HEPATITIS B SURFACE ANTIGEN: HEP B S AG: NEGATIVE

## 2017-06-17 LAB — HEPATITIS B SURFACE ANTIBODY, QUANTITATIVE

## 2017-06-17 MED ORDER — PREDNISONE 20 MG PO TABS: 40.0000 mg | ORAL_TABLET | Freq: Every day | ORAL | 0 refills | Status: AC

## 2017-06-17 MED ORDER — IPRATROPIUM-ALBUTEROL 0.5-2.5 (3) MG/3ML IN SOLN
3.0000 mL | Freq: Four times a day (QID) | RESPIRATORY_TRACT | Status: DC
  Filled 2017-06-17 (×4): qty 3

## 2017-06-17 MED ORDER — EPOETIN ALFA 10000 UNIT/ML IJ SOLN
10000.0000 [IU] | INTRAMUSCULAR | Status: DC
  Administered 2017-06-17: 10000 [IU] via INTRAVENOUS

## 2017-06-17 MED ORDER — IPRATROPIUM-ALBUTEROL 0.5-2.5 (3) MG/3ML IN SOLN
3.0000 mL | RESPIRATORY_TRACT | Status: DC | PRN
  Filled 2017-06-17: qty 3

## 2017-06-17 NOTE — Discharge Instructions (Signed)
Heart Failure Clinic appointment on June 24 2017 at 10:40am with [name], [place]. Please call [phone] to reschedule.   Renal diet with daily fluids < 1500 ml

## 2017-06-17 NOTE — Progress Notes (Signed)
HD initiated via R AVG without issue using 15g needles x2. Patient currently without complaitns. 99% on 2L [place]. No heparin treatment. UF goal 2.5L as ordered. 2K bath. Labs sent, pending.

## 2017-06-17 NOTE — Progress Notes (Signed)
[facility]  ROUNDING NOTE   Subjective:   Patient seen and examined on hemodialysis. Tolerating treatment well. Extra treatment yesterday due to volume overload.   UF 2.5 liters.     HEMODIALYSIS FLOWSHEET:  Blood Flow Rate (mL/min): 400 mL/min Arterial Pressure (mmHg): -110 mmHg Venous Pressure (mmHg): 270 mmHg Transmembrane Pressure (mmHg): 70 mmHg Ultrafiltration Rate (mL/min): 1000 mL/min Dialysate Flow Rate (mL/min): 600 ml/min Conductivity: Machine : 14 Conductivity: Machine : 14 Dialysis Fluid Bolus: Normal Saline Bolus Amount (mL): 100 mL Dialysate Change: 2K   Objective:  Vital signs in last 24 hours:  Temp:  [97.8 F (36.6 C)-98.6 F (37 C)] 97.8 F (36.6 C) (11/08 0920) Pulse Rate:  [64-93] 64 (11/08 1130) Resp:  [15-26] 17 (11/08 1130) BP: (126-166)/(70-103) 132/82 (11/08 1130) SpO2:  [92 %-100 %] 95 % (11/08 1130) Weight:  [84 kg (185 lb 1.6 oz)-88.4 kg (194 lb 14.2 oz)] 84.2 kg (185 lb 10 oz) (11/08 0920)  Weight change: 5.142 kg (11 lb 5.4 oz) Filed Weights   06/16/17 1317 06/17/17 0326 06/17/17 0920  Weight: 88.4 kg (194 lb 14.2 oz) 84 kg (185 lb 1.6 oz) 84.2 kg (185 lb 10 oz)    Intake/Output: I/O last 3 completed shifts: In: 74 [P.O.:480; I.V.:3] Out: 2753 [Urine:150; Other:2603]   Intake/Output this shift:  No intake/output data recorded.  Physical Exam: General: NAD,   Head: Normocephalic, atraumatic. Moist oral mucosal membranes  Eyes: Anicteric, PERRL  Neck: Supple, trachea midline  Lungs:  Clear to auscultation  Heart: Regular rate and rhythm  Abdomen:  Soft, nontender,   Extremities: No peripheral edema.  Neurologic: Nonfocal, moving all four extremities  Skin: No lesions  Access: Right forearm AVG    Basic Metabolic Panel: Recent Labs  Lab 06/15/17 1630 06/16/17 0351 06/17/17 0853  NA 137 136 133*  K 4.1 6.0* 4.7  CL 97* 99* 91*  CO2 29 26 25   GLUCOSE 96 139* 186*  BUN 41* 55* 60*  CREATININE 8.68*  9.67* 8.69*  CALCIUM 8.4* 8.0* 8.7*  PHOS  --   --  3.6    Liver Function Tests: Recent Labs  Lab 06/15/17 1630 06/17/17 0853  AST 37  --   ALT 54  --   ALKPHOS 74  --   BILITOT 0.8  --   PROT 8.1  --   ALBUMIN 3.8 3.4*   No results for input(s): LIPASE, AMYLASE in the last 168 hours. No results for input(s): AMMONIA in the last 168 hours.  CBC: Recent Labs  Lab 06/15/17 1630 06/16/17 0351 06/17/17 0853  WBC 6.0 5.3 7.6  HGB 9.9* 9.7* 10.4*  HCT 30.5* 29.6* 31.7*  MCV 89.8 88.7 89.6  PLT 270 260 261    Cardiac Enzymes: Recent Labs  Lab 06/15/17 1630  TROPONINI 0.05*    BNP: Invalid input(s): POCBNP  CBG: Recent Labs  Lab 06/16/17 0715 06/16/17 1551 06/16/17 2039 06/17/17 0736  GLUCAP 150* 184* 139* 167*    Microbiology: Results for orders placed or performed during the hospital encounter of 06/15/17  MRSA PCR Screening     Status: None   Collection Time: 06/15/17  8:08 PM  Result Value Ref Range Status   MRSA by PCR NEGATIVE NEGATIVE Final    Comment:        The GeneXpert MRSA Assay (FDA approved for NASAL specimens only), is one component of a comprehensive MRSA colonization surveillance program. It is not intended to diagnose MRSA infection nor to guide or  monitor treatment for MRSA infections.     Coagulation Studies: Recent Labs    06/15/17 1630  LABPROT 14.3  INR 1.12    Urinalysis: No results for input(s): COLORURINE, LABSPEC, PHURINE, GLUCOSEU, HGBUR, BILIRUBINUR, KETONESUR, PROTEINUR, UROBILINOGEN, NITRITE, LEUKOCYTESUR in the last 72 hours.  Invalid input(s): APPERANCEUR    Imaging: Dg Chest 2 View  Result Date: 06/15/2017 CLINICAL DATA:  Shortness of breath and lower extremity swelling earlier today. EXAM: CHEST  2 VIEW COMPARISON:  CT chest reported separately. FINDINGS: Cardiomegaly. Diffuse BILATERAL pulmonary opacity consistent with pulmonary edema. Vascular clips in the LEFT arm. Small RIGHT effusion, no  pneumothorax. Bones unremarkable. IMPRESSION: Cardiomegaly with diffuse BILATERAL pulmonary opacities consistent with pulmonary edema. Electronically Signed   By: Staci Righter M.D.   On: 06/15/2017 17:38   Ct Angio Chest Pe W And/or Wo Contrast  Result Date: 06/15/2017 CLINICAL DATA:  Shortness of breath. Change in dialysis schedule for a vacation. Currently on 3 L nasal cannula with fat sat 94%. Left lower extremity swelling. Swelling started after walking around [place]. Dialysis for 7 years. EXAM: CT ANGIOGRAPHY CHEST WITH CONTRAST TECHNIQUE: Multidetector CT imaging of the chest was performed using the standard protocol during bolus administration of intravenous contrast. Multiplanar CT image reconstructions and MIPs were obtained to evaluate the vascular anatomy. CONTRAST:  75 cc Isovue 370 COMPARISON:  Chest x-ray 02/24/2017 FINDINGS: Cardiovascular: The heart is enlarged. There is no pericardial effusion. Minimal coronary artery calcification is present. No significant calcification of the thoracic aorta or aneurysmal dilatation. The pulmonary arteries are well opacified and there is no acute pulmonary embolus. There is a partially imaged left upper extremity thrombosed dialysis graft. Mediastinum/Nodes: The visualized portion of the thyroid gland has a normal appearance. Numerous enlarged mediastinal and hilar lymph nodes are present, showing progression since the prior study. Prevascular lymph node is 2.2 x 1.7 cm. Right paratracheal lymph node is 2.1 cm. Subcarinal soft tissue density/ confluent lymphadenopathy is 3.2 x 2.0 cm. There is confluent adenopathy in the hilar regions, somewhat difficult to measure. Largest discrete node in the right hilar region is 1.4 cm. The esophagus is normal in appearance. No enlarged axillary lymph nodes. Lungs/Pleura: Small right pleural effusion. There are ground-glass opacities throughout the lungs, superimposed on upper lobe emphysematous changes. More  confluent airspace filling opacities are identified throughout the lungs bilaterally and in there is mild dependent change in the posterior lower lobes. Findings are compatible with edema. Superimposed infectious process cannot be excluded radiographically. Upper Abdomen: Visualized portions of the liver and spleen are normal in appearance. Partially imaged cystic change in the left kidney compatible with known chronic renal disease. Musculoskeletal: No chest wall abnormality. No acute or significant osseous findings. Review of the MIP images confirms the above findings. IMPRESSION: 1. Lung parenchymal changes compatible with pulmonary edema although superimposed infectious infiltrates could have a similar appearance. 2.  Emphysema (ICD10-J43.9) 3. Progression in mediastinal and hilar adenopathy, suspicious for inflammatory or infectious process versus lymphoproliferative or metastatic disease. Correlation with history and further workup are appropriate. 4. Cardiomegaly and coronary artery disease. 5. Partially imaged thrombosed left upper extremity dialysis graft. Electronically Signed   By: Nolon Nations M.D.   On: 06/15/2017 17:50   US Venous Img Lower Bilateral  Result Date: 06/15/2017 CLINICAL DATA:  Lower extremity swelling for 3 days EXAM: BILATERAL LOWER EXTREMITY VENOUS DOPPLER ULTRASOUND TECHNIQUE: Gray-scale sonography with graded compression, as well as color Doppler and duplex ultrasound were performed to evaluate the lower  extremity deep venous systems from the level of the common femoral vein and including the common femoral, femoral, profunda femoral, popliteal and calf veins including the posterior tibial, peroneal and gastrocnemius veins when visible. The superficial great saphenous vein was also interrogated. Spectral Doppler was utilized to evaluate flow at rest and with distal augmentation maneuvers in the common femoral, femoral and popliteal veins. COMPARISON:  LEFT lower extremity  venous ultrasound of 04/29/2016 FINDINGS: RIGHT LOWER EXTREMITY Common Femoral Vein: No evidence of thrombus. Normal compressibility, respiratory phasicity and response to augmentation. Saphenofemoral Junction: No evidence of thrombus. Normal compressibility and flow on color Doppler imaging. Profunda Femoral Vein: No evidence of thrombus. Normal compressibility and flow on color Doppler imaging. Femoral Vein: No evidence of thrombus. Normal compressibility, respiratory phasicity and response to augmentation. Popliteal Vein: No evidence of thrombus. Normal compressibility, respiratory phasicity and response to augmentation. Calf Veins: No evidence of thrombus. Normal compressibility and flow on color Doppler imaging. Superficial Great Saphenous Vein: No evidence of thrombus. Normal compressibility. Venous Reflux:  None. Other Findings:  None. LEFT LOWER EXTREMITY Common Femoral Vein: No evidence of thrombus. Normal compressibility, respiratory phasicity and response to augmentation. Saphenofemoral Junction: No evidence of thrombus. Normal compressibility and flow on color Doppler imaging. Profunda Femoral Vein: No evidence of thrombus. Normal compressibility and flow on color Doppler imaging. Femoral Vein: No evidence of thrombus. Normal compressibility, respiratory phasicity and response to augmentation. Popliteal Vein: No evidence of thrombus. Normal compressibility, respiratory phasicity and response to augmentation. Calf Veins: No evidence of thrombus. Normal compressibility and flow on color Doppler imaging. Superficial Great Saphenous Vein: No evidence of thrombus. Normal compressibility. Venous Reflux:  None. Other Findings:  None. IMPRESSION: No evidence of deep venous thrombosis in either lower extremity. Electronically Signed   By: Lavonia Dana M.D.   On: 06/15/2017 18:07     Medications:   . sodium chloride     . amLODipine  5 mg Oral Daily  . aspirin  81 mg Oral Daily  . budesonide (PULMICORT)  nebulizer solution  0.25 mg Nebulization BID  . calcium acetate  667 mg Oral TID WC  . cinacalcet  30 mg Oral Daily  . cloNIDine  0.3 mg Oral TID  . epoetin (EPOGEN/PROCRIT) injection  10,000 Units Intravenous Q T,Th,Sa-HD  . furosemide  40 mg Intravenous Q12H  . gabapentin  300 mg Oral Daily  . guaiFENesin  600 mg Oral BID  . heparin  5,000 Units Subcutaneous Q8H  . hydrALAZINE  100 mg Oral Q6H  . insulin aspart  0-9 Units Subcutaneous TID WC  . ipratropium-albuterol  3 mL Nebulization Q6H  . losartan  25 mg Oral Daily  . magnesium oxide  400 mg Oral Daily  . mouth rinse  15 mL Mouth Rinse BID  . methylPREDNISolone (SOLU-MEDROL) injection  60 mg Intravenous Q6H  . metoprolol succinate  50 mg Oral Daily  . multivitamin  1 tablet Oral QHS  . pantoprazole  40 mg Oral Daily  . sodium chloride flush  3 mL Intravenous Q12H   sodium chloride, acetaminophen, amitriptyline, cyclobenzaprine, hydrALAZINE, ipratropium-albuterol, labetalol, LORazepam, nitroGLYCERIN, ondansetron **OR** ondansetron (ZOFRAN) IV, oxyCODONE-acetaminophen **AND** oxyCODONE, prochlorperazine, sodium chloride flush  Assessment/ Plan:  [name] is a 56 y.o. black female with end-stage renal disease secondary to polycystic kidney disease on hemodialysis, hypertension, GERD, diastolic congestive heart failure, peripheral neuropathy, COPD/asthma  TTS [facility]. Right AVG. EDW 84kg.   1. End Stage Renal Disease: with hyperkalemia Seen and  examined on hemodialysis. Tolerating treatment. Extra treatment yesterday due to hyperkalemia and volume overload.  Continue TTS schedule.   2. Hypertension: with diastolic congestive heart failure  - Low salt diet, fluid restriction - amlodipine, clonidine, furosemide, hydralazine, losartan  3. Anemia of chronic kidney disease: Hemoglobin 10.4 - EPO with HD treatment  4. Secondary Hyperparathyroidism: phosphorus at goal.  - Calcium acetate - cinacalcet.      LOS: [place], Jasmine Holloway 11/8/201811:45 AM

## 2017-06-17 NOTE — Progress Notes (Signed)
Pre HD assessment. Lungs diminished bilaterally.

## 2017-06-17 NOTE — Progress Notes (Signed)
Decreased to 3l 

## 2017-06-17 NOTE — Progress Notes (Signed)
Post HD assessment unchanged. Patient tolerated well. UF 2.1L. Report called to primary RN

## 2017-06-17 NOTE — Progress Notes (Signed)
Pre hd 

## 2017-06-17 NOTE — Consult Note (Signed)
Went to counsel patient on heart failure. Patient and I realized quickly that I had counseled her about a month ago at the heart failure clinic. Patient said she still has her packet at home and doesn't have anymore questions. States that she discussed with her nephrologist better way to avoid getting fluid overloaded when she needs to travel.  Ramond Dial, Pharm.D, BCPS Clinical Pharmacist

## 2017-06-17 NOTE — Progress Notes (Signed)
A&O. Up to [facility] independently.  Still having muscle cramps in hands. Potassium to be redrawn this AM. Will continue to monitor.

## 2017-06-22 ENCOUNTER — Ambulatory Visit
Admission: RE | Admit: 2017-06-22 | Discharge: 2017-06-22 | Payer: MEDICARE | Attending: Nephrology | Admitting: Nephrology

## 2017-06-22 NOTE — Discharge Summary (Signed)
[facility] [place] NAME: Jasmine Holloway    MR#:  [id number]  DATE OF BIRTH:  [birth year]  DATE OF ADMISSION:  06/15/2017 ADMITTING PHYSICIAN: Dustin Flock, MD  DATE OF DISCHARGE: 06/17/2017  3:37 PM  PRIMARY CARE PHYSICIAN: Theotis Burrow, MD   ADMISSION DIAGNOSIS:  Cough [R05] Hypoxia [R09.02] Elevated troponin [R74.8] Acute on chronic congestive heart failure, unspecified heart failure type ([place]) [I50.9]  DISCHARGE DIAGNOSIS:  Active Problems:   Acute CHF (congestive heart failure) ([place])   SECONDARY DIAGNOSIS:   Past Medical History:  Diagnosis Date  . Asthma   . CHF (congestive heart failure) ([place])   . COPD (chronic obstructive pulmonary disease) ([place])   . Diabetes mellitus without complication ([place])   . Diastolic heart failure ([place])   . ESRD (end stage renal disease) on dialysis ([place])   . Hypertension   . Neuropathy   . Polycystic kidney disease   . Renal insufficiency      ADMITTING HISTORY  HISTORY OF PRESENT ILLNESS: Jasmine Holloway  is a 56 y.o. female with a known history of COPD on 2 L of oxygen which she had read recently increased to 3 L, end-stage renal disease, CHF who is presenting with complaint of shortness of breath, chest pain and lower extremity swelling and cough ongoing for the past few days.  Patient reports that she could not complete her dialysis because of the symptoms today.  She had 30 minutes left.  She also complains of some anxiety.  She is noticed to have wheezing.  She underwent evaluation CT scan of the chest which showed findings consistent with CHF.     HOSPITAL COURSE:   Patient is a 56 year old with complaint of shortness of breath  *Acute on chronic diastolic CHF Started on IV Lasix initially.  Later nephrology was consulted and patient had 2 sessions of dialysis during her hospital stay with significant improvement.  Patient was counseled to be compliant with her dialysis and also  fluid restriction.  By the day of discharge patient is back to normal with her breathing.  Continue hemodialysis schedule as outpatient.  *Acute on chronic COPD exacerbation Treated with IV steroids, nebulizers.  Improved well.  No wheezing on day of discharge.  Resolved.  *Acute on chronic respiratory failure due to CHF and COPD.  *Accelerated hypertension Increased home dose of clonidine and hydralazine.  * End-stage renal disease Nephrology consulted.  Discussed with Dr. Juleen China  *Diabetes type 2 place her on sliding scale insulin No change in home medications  Patient stable for discharge home.    CONSULTS OBTAINED:  Treatment Team:  Lavonia Dana, MD  DRUG ALLERGIES:   Allergies  Allergen Reactions  . Morphine And Related Nausea And Vomiting  . Strawberry Extract   . Tramadol Nausea And Vomiting  . Venofer [Iron Sucrose]   . Vicodin [Hydrocodone-Acetaminophen] Nausea And Vomiting  . Buprenorphine Hcl Nausea And Vomiting  . Codeine Nausea And Vomiting  . Morphine Nausea And Vomiting    Halllucinations    DISCHARGE MEDICATIONS:   Discharge Medication List as of 06/17/2017  1:41 PM    START taking these medications   Details  predniSONE (DELTASONE) 20 MG tablet Take 2 tablets (40 mg total) daily for 3 days by mouth., Starting Thu 06/17/2017, Until Sun 06/20/2017, Normal      CONTINUE these medications which have NOT CHANGED   Details  metoprolol succinate (TOPROL-XL) 50 MG 24 hr tablet Take 50 mg 2 (  two) times daily by mouth., Historical Med    rosuvastatin (CRESTOR) 5 MG tablet Take 5 mg daily by mouth., Historical Med    acetaminophen (TYLENOL) 325 MG tablet Take 2 tablets (650 mg total) by mouth every 6 (six) hours as needed for mild pain or headache., Starting Wed 03/18/2016, OTC    amitriptyline (ELAVIL) 10 MG tablet Take 10 mg by mouth at bedtime as needed for sleep., Historical Med    amLODipine (NORVASC) 5 MG tablet Take 5 mg by mouth  daily. Take 5mg  in the afternoon on dialysis days, Starting Fri 08/31/2014, Historical Med    aspirin 81 MG chewable tablet Chew 1 tablet (81 mg total) by mouth daily., Starting Fri 01/17/2016, Normal    b complex-vitamin c-folic acid (NEPHRO-VITE) 0.8 MG TABS tablet Take 1 tablet by mouth at bedtime., Historical Med    calcium acetate (PHOSLO) 667 MG capsule Take 1 capsule (667 mg total) by mouth 3 (three) times daily with meals., Starting Tue 02/02/2017, No Print    cloNIDine (CATAPRES) 0.1 MG tablet Take 1 tablet (0.1 mg total) by mouth 2 (two) times daily., Starting Fri 01/17/2016, Normal    diphenhydrAMINE (BENADRYL) 25 mg capsule Take 2 capsules (50 mg total) by mouth every 6 (six) hours as needed., Starting Fri 10/16/2016, Print    furosemide (LASIX) 80 MG tablet Take 1 tablet (80 mg total) by mouth daily., Starting Thu 03/05/2016, Until Wed 11/10/2017, Normal    gabapentin (NEURONTIN) 300 MG capsule Take 300 mg 2 (two) times daily by mouth. , Historical Med    hydrALAZINE (APRESOLINE) 50 MG tablet Take 1 tablet (50 mg total) by mouth 3 (three) times daily., Starting Tue 02/02/2017, Print    lidocaine (XYLOCAINE) 5 % ointment Apply topically., Historical Med    losartan (COZAAR) 25 MG tablet Take 1 tablet (25 mg total) by mouth daily., Starting Mon 02/08/2017, Normal    nitroGLYCERIN (NITROSTAT) 0.4 MG SL tablet Place 1 tablet (0.4 mg total) under the tongue every 5 (five) minutes as needed for chest pain., Starting Thu 01/14/2017, Until Tue 05/11/2017, Normal    omeprazole (PRILOSEC) 40 MG capsule Take by mouth daily. , Starting Sun 12/20/2016, Historical Med    ondansetron (ZOFRAN) 4 MG tablet Take 1 tablet (4 mg total) by mouth every 8 (eight) hours as needed for nausea or vomiting., Starting Tue 07/21/2016, Print    oxyCODONE-acetaminophen (PERCOCET) 10-325 MG tablet Take 1 tablet by mouth daily as needed for pain., Historical Med    prochlorperazine (COMPAZINE) 10 MG tablet Take 1 tablet (10  mg total) by mouth every 6 (six) hours as needed for nausea., Starting Thu 10/15/2016, Print    SENSIPAR 30 MG tablet Take 1 tablet (30 mg total) by mouth daily., Starting Tue 07/07/2016, Normal    VENTOLIN HFA 108 (90 Base) MCG/ACT inhaler Inhale 2 puffs into the lungs daily as needed. , Starting Thu 05/21/2016, Historical Med      STOP taking these medications     magnesium oxide (MAG-OX) 400 MG tablet         Today   VITAL SIGNS:  Blood pressure (!) 148/84, pulse 68, temperature 97.6 F (36.4 C), temperature source Oral, resp. rate (!) 22, height 5\' 3"  (1.6 m), weight 82.1 kg (180 lb 16 oz), SpO2 92 %.  I/O:  No intake or output data in the 24 hours ending 06/22/17 1503  PHYSICAL EXAMINATION:  Physical Exam  GENERAL:  56 y.o.-year-old patient lying in the bed with no acute  distress.  LUNGS: Normal breath sounds bilaterally, no wheezing, rales,rhonchi or crepitation. No use of accessory muscles of respiration.  CARDIOVASCULAR: S1, S2 normal. No murmurs, rubs, or gallops.  ABDOMEN: Soft, non-tender, non-distended. Bowel sounds present. No organomegaly or mass.  NEUROLOGIC: Moves all 4 extremities. PSYCHIATRIC: The patient is alert and oriented x 3.  SKIN: No obvious rash, lesion, or ulcer.   DATA REVIEW:   CBC Recent Labs  Lab 06/17/17 0853  WBC 7.6  HGB 10.4*  HCT 31.7*  PLT 261    Chemistries  Recent Labs  Lab 06/15/17 1630  06/17/17 0853  NA 137   < > 133*  K 4.1   < > 4.7  CL 97*   < > 91*  CO2 29   < > 25  GLUCOSE 96   < > 186*  BUN 41*   < > 60*  CREATININE 8.68*   < > 8.69*  CALCIUM 8.4*   < > 8.7*  AST 37  --   --   ALT 54  --   --   ALKPHOS 74  --   --   BILITOT 0.8  --   --    < > = values in this interval not displayed.    Cardiac Enzymes Recent Labs  Lab 06/15/17 1630  TROPONINI 0.05*    Microbiology Results  Results for orders placed or performed during the hospital encounter of 06/15/17  MRSA PCR Screening     Status: None    Collection Time: 06/15/17  8:08 PM  Result Value Ref Range Status   MRSA by PCR NEGATIVE NEGATIVE Final    Comment:        The GeneXpert MRSA Assay (FDA approved for NASAL specimens only), is one component of a comprehensive MRSA colonization surveillance program. It is not intended to diagnose MRSA infection nor to guide or monitor treatment for MRSA infections.     RADIOLOGY:  No results found.  Follow up with PCP in 1 week.  Management plans discussed with the patient, family and they are in agreement.  CODE STATUS:  Code Status History    Date Active Date Inactive Code Status Order ID Comments User Context   06/15/2017 19:46 06/17/2017 18:43 Full Code [id number]  Dustin Flock, MD Inpatient   02/01/2017 07:47 02/02/2017 17:40 Full Code [id number]  Loletha Grayer, MD ED   11/10/2016 08:21 11/11/2016 17:54 Full Code [id number]  Bazine, Ubaldo Glassing, DO Inpatient   07/04/2016 05:09 07/07/2016 20:31 Full Code [id number]  Mikael Spray, NP ED   06/09/2016 04:58 06/10/2016 19:32 Full Code [id number]  Awilda Bill, NP ED   05/11/2016 10:24 05/12/2016 18:26 Full Code [id number]  Vaughan Basta, MD Inpatient   04/28/2016 20:30 04/30/2016 17:49 Full Code [id number]  Gladstone Lighter, MD Inpatient   03/28/2016 18:25 03/29/2016 17:26 Full Code [id number]  Vaughan Basta, MD ED   03/17/2016 06:06 03/18/2016 07:51 Full Code [id number]  Saundra Shelling, MD ED   01/10/2016 21:11 01/17/2016 21:36 Full Code [id number]  Mikael Spray, NP ED   12/21/2015 06:08 12/22/2015 16:09 Full Code [id number]  Saundra Shelling, MD Inpatient   02/24/2015 03:52 02/24/2015 16:15 Full Code [id number]  Harrie Foreman, MD Inpatient      TOTAL TIME TAKING CARE OF THIS PATIENT ON DAY OF DISCHARGE: more than 30 minutes.   Hillary Bow R M.D on 06/22/2017 at 3:03 PM  Between 7am to 6pm - Pager - [phone]  After 6pm go to [email or web address] - [place]  [facility]  Office   [phone]  CC: Primary care physician; Theotis Burrow, MD  Note: This dictation was prepared with Dragon dictation along with smaller phrase technology. Any transcriptional errors that result from this process are unintentional.

## 2017-06-24 ENCOUNTER — Encounter: Payer: Self-pay | Admitting: Family

## 2017-06-24 ENCOUNTER — Other Ambulatory Visit: Payer: Self-pay

## 2017-06-24 ENCOUNTER — Ambulatory Visit: Payer: Medicare Other | Attending: Family | Admitting: Family

## 2017-06-24 VITALS — BP 172/93 | HR 64 | Resp 18 | Ht 63.0 in | Wt 189.0 lb

## 2017-06-24 DIAGNOSIS — J449 Chronic obstructive pulmonary disease, unspecified: Secondary | ICD-10-CM | POA: Insufficient documentation

## 2017-06-24 DIAGNOSIS — I132 Hypertensive heart and chronic kidney disease with heart failure and with stage 5 chronic kidney disease, or end stage renal disease: Secondary | ICD-10-CM | POA: Insufficient documentation

## 2017-06-24 DIAGNOSIS — Z885 Allergy status to narcotic agent status: Secondary | ICD-10-CM | POA: Insufficient documentation

## 2017-06-24 DIAGNOSIS — Z87891 Personal history of nicotine dependence: Secondary | ICD-10-CM | POA: Insufficient documentation

## 2017-06-24 DIAGNOSIS — E1140 Type 2 diabetes mellitus with diabetic neuropathy, unspecified: Secondary | ICD-10-CM | POA: Diagnosis not present

## 2017-06-24 DIAGNOSIS — R0602 Shortness of breath: Secondary | ICD-10-CM | POA: Diagnosis present

## 2017-06-24 DIAGNOSIS — Z79899 Other long term (current) drug therapy: Secondary | ICD-10-CM | POA: Insufficient documentation

## 2017-06-24 DIAGNOSIS — Z992 Dependence on renal dialysis: Secondary | ICD-10-CM | POA: Insufficient documentation

## 2017-06-24 DIAGNOSIS — I890 Lymphedema, not elsewhere classified: Secondary | ICD-10-CM | POA: Diagnosis not present

## 2017-06-24 DIAGNOSIS — I5032 Chronic diastolic (congestive) heart failure: Secondary | ICD-10-CM | POA: Diagnosis not present

## 2017-06-24 DIAGNOSIS — Q613 Polycystic kidney, unspecified: Secondary | ICD-10-CM | POA: Insufficient documentation

## 2017-06-24 DIAGNOSIS — N186 End stage renal disease: Secondary | ICD-10-CM | POA: Diagnosis not present

## 2017-06-24 DIAGNOSIS — I10 Essential (primary) hypertension: Secondary | ICD-10-CM

## 2017-06-24 DIAGNOSIS — E1122 Type 2 diabetes mellitus with diabetic chronic kidney disease: Secondary | ICD-10-CM | POA: Diagnosis not present

## 2017-06-24 DIAGNOSIS — Z7982 Long term (current) use of aspirin: Secondary | ICD-10-CM | POA: Diagnosis not present

## 2017-06-24 MED ORDER — LOSARTAN POTASSIUM 50 MG PO TABS: 50.0000 mg | ORAL_TABLET | Freq: Every day | ORAL | 3 refills | Status: DC

## 2017-06-24 NOTE — Progress Notes (Signed)
Subjective:    Patient ID: Jasmine Holloway, female    DOB: May 07, 1961, 56 y.o.   MRN: [id number]  HPI   Jasmine Holloway is a 56 y/o female with a history of asthma, DM, HTN, kidney disease (dialysis), COPD, previous tobacco use and chronic heart failure.   Reviewed echo report on 11/10/16 which showed an EF of 50-55% along with mild AR/MR. EF was 55-60% May 2017. Cardiac catheterization was done 03/17/16 and showed normal coronary arteries.   Admitted 06/15/17 due to HF exacerbation. Chest CT confirmed HF. Initially needed IV furosemide and then transitioned back to her oral diuretics. Also needed IV steroids and nebulizers initially. Nephrology was consulted and she was discharged home in 2 days. Was in the ED 04/10/17 but left without being seen. Was in the ED 04/09/17 and 03/29/17 with epistaxis. Both times she was was treated and released. Was in the ED 02/24/17 with shortness of breath. She was treated and release for her next day dialysis. Was in the ED 02/09/17 due to migraine and she was treated and released.  Admitted 02/01/17 with pulmonary edema. Initially needed bipap. BP was elevated initially. Elevated troponin thought to be due to demand ischemia. Continued with dialysis. Discharged the following day.   She presents today for a follow-up visit with a chief complaint of minimal shortness of breath upon moderate exertion. She says that this has been chronic in nature having been present for several years with varying levels of severity. She has associated edema, palpitations, headaches and difficulty sleeping along with this. She denies any fatigue, chest pain, weight gain or dizziness. BP has been running high at home.   Past Medical History:  Diagnosis Date  . Asthma   . CHF (congestive heart failure) ([place])   . COPD (chronic obstructive pulmonary disease) ([place])   . Diabetes mellitus without complication ([place])   . Diastolic heart failure ([place])   . ESRD (end stage renal disease) on dialysis ([place])   .  Hypertension   . Neuropathy   . Polycystic kidney disease   . Renal insufficiency     Past Surgical History:  Procedure Laterality Date  . ARTERIOVENOUS GRAFT PLACEMENT Right    x3 (R forearm currently used for access)  . BREAST BIOPSY Left 12/03/2015   neg  . COLON SURGERY    . ESOPHAGOGASTRODUODENOSCOPY (EGD) WITH PROPOFOL N/A 02/04/2017   Procedure: ESOPHAGOGASTRODUODENOSCOPY (EGD) WITH PROPOFOL;  Surgeon: Jonathon Bellows, MD;  Location: [facility];  Service: Endoscopy;  Laterality: N/A;  . PERIPHERAL VASCULAR CATHETERIZATION Right 08/04/2016   Procedure: A/V Shuntogram;  Surgeon: Algernon Huxley, MD;  Location: [facility];  Service: Cardiovascular;  Laterality: Right;  . VEIN HARVEST      Family History  Problem Relation Age of Onset  . Hypertension Brother   . Heart failure Brother   . Hypertension Mother   . Heart disease Mother     Social History   Tobacco Use  . Smoking status: Former Smoker    Packs/day: 0.25    Last attempt to quit: 08/11/2015    Years since quitting: 1.8  . Smokeless tobacco: Never Used  Substance Use Topics  . Alcohol use: No    Alcohol/week: 0.0 oz    Allergies  Allergen Reactions  . Morphine And Related Nausea And Vomiting  . Strawberry Extract   . Tramadol Nausea And Vomiting  . Venofer [Iron Sucrose]   . Vicodin [Hydrocodone-Acetaminophen] Nausea And Vomiting  . Buprenorphine Hcl Nausea And Vomiting  .  Codeine Nausea And Vomiting  . Morphine Nausea And Vomiting    Halllucinations   Prior to Admission medications   Medication Sig Start Date End Date Taking? Authorizing Provider  acetaminophen (TYLENOL) 325 MG tablet Take 2 tablets (650 mg total) by mouth every 6 (six) hours as needed for mild pain or headache. Patient taking differently: Take 325 mg every 6 (six) hours as needed by mouth for mild pain or headache.  03/18/16  Yes Gouru, Aruna, MD  amitriptyline (ELAVIL) 10 MG tablet Take 10 mg by mouth at bedtime as needed for  sleep.   Yes [provider]  amLODipine (NORVASC) 5 MG tablet Take 5 mg daily by mouth.  08/31/14  Yes [provider]  aspirin 81 MG chewable tablet Chew 1 tablet (81 mg total) by mouth daily. 01/17/16  Yes Gladstone Lighter, MD  b complex-vitamin c-folic acid (NEPHRO-VITE) 0.8 MG TABS tablet Take 1 tablet by mouth at bedtime.   Yes [provider]  calcium acetate (PHOSLO) 667 MG capsule Take 1 capsule (667 mg total) by mouth 3 (three) times daily with meals. Patient taking differently: Take 667-1,334 mg 3 (three) times daily with meals by mouth.  02/02/17  Yes Wieting, Richard, MD  cloNIDine (CATAPRES) 0.1 MG tablet Take 1 tablet (0.1 mg total) by mouth 2 (two) times daily. 01/17/16  Yes Gladstone Lighter, MD  diphenhydrAMINE (BENADRYL) 25 mg capsule Take 2 capsules (50 mg total) by mouth every 6 (six) hours as needed. 10/16/16  Yes Carrie Mew, MD  furosemide (LASIX) 80 MG tablet Take 1 tablet (80 mg total) by mouth daily. 03/05/16 11/10/17 Yes Caedmon Louque, Otila Kluver A, FNP  gabapentin (NEURONTIN) 300 MG capsule Take 300 mg 2 (two) times daily by mouth.    Yes [provider]  hydrALAZINE (APRESOLINE) 50 MG tablet Take 1 tablet (50 mg total) by mouth 3 (three) times daily. 02/02/17  Yes Wieting, Richard, MD  lidocaine (XYLOCAINE) 5 % ointment Apply topically.   Yes [provider]  losartan (COZAAR) 25 MG tablet Take 1 tablet (25 mg total) by mouth daily. 02/08/17  Yes [name] A, FNP  metoprolol succinate (TOPROL-XL) 50 MG 24 hr tablet Take 50 mg daily by mouth.    Yes [provider]  nitroGLYCERIN (NITROSTAT) 0.4 MG SL tablet Place 1 tablet (0.4 mg total) under the tongue every 5 (five) minutes as needed for chest pain. 01/14/17 05/11/17  Minna Merritts, MD  omeprazole (PRILOSEC) 40 MG capsule Take by mouth daily.  12/20/16   [provider]  ondansetron (ZOFRAN) 4 MG tablet Take 1 tablet (4 mg total) by mouth every 8 (eight) hours as needed  for nausea or vomiting. Patient not taking: Reported on 06/16/2017 07/21/16   Daymon Larsen, MD  oxyCODONE-acetaminophen (PERCOCET) 10-325 MG tablet Take 1 tablet by mouth daily as needed for pain.    [provider]  prochlorperazine (COMPAZINE) 10 MG tablet Take 1 tablet (10 mg total) by mouth every 6 (six) hours as needed for nausea. 10/15/16   Paulette Blanch, MD  rosuvastatin (CRESTOR) 5 MG tablet Take 5 mg daily by mouth.    [provider]  SENSIPAR 30 MG tablet Take 1 tablet (30 mg total) by mouth daily. 07/07/16   Fritzi Mandes, MD  VENTOLIN HFA 108 (90 Base) MCG/ACT inhaler Inhale 2 puffs into the lungs daily as needed.  05/21/16   [provider]    Review of Systems  Constitutional: Negative for appetite change and  fatigue.  HENT: Negative for congestion, nosebleeds, sinus pressure and sore throat.   Eyes: Positive for visual disturbance (blurry vision). Negative for pain.  Respiratory: Positive for shortness of breath. Negative for cough and chest tightness.   Cardiovascular: Positive for palpitations and leg swelling. Negative for chest pain.  Gastrointestinal: Negative for abdominal distention and abdominal pain.  Endocrine: Negative.   Genitourinary: Negative.   Musculoskeletal: Negative for back pain and neck pain.  Skin: Negative.   Allergic/Immunologic: Negative.   Neurological: Positive for numbness (tingling down left arm at times due to previous graft) and headaches. Negative for dizziness and light-headedness.  Hematological: Negative for adenopathy. Does not bruise/bleed easily.  Psychiatric/Behavioral: Positive for sleep disturbance (sleeping on 2 pillows). Negative for dysphoric mood. The patient is not nervous/anxious.       Objective:   Physical Exam  Constitutional: She is oriented to person, place, and time. She appears well-developed and well-nourished.  HENT:  Head: Normocephalic and atraumatic.  Neck: Normal range of motion. Neck  supple.  Cardiovascular: Normal rate and regular rhythm.  Pulmonary/Chest: Effort normal. She has no wheezes. She has no rales.  Abdominal: Soft. She exhibits no distension. There is no tenderness.  Musculoskeletal: She exhibits edema (2+ pitting edema in left lower leg; 1+ pitting in right lower leg.). She exhibits no tenderness.  Neurological: She is alert and oriented to person, place, and time.  Skin: Skin is warm and dry.  Psychiatric: She has a normal mood and affect. Her behavior is normal. Thought content normal.  Nursing note and vitals reviewed.  Vitals:   06/24/17 0958  BP: (!) 172/93  Pulse: 64  Resp: 18  SpO2: 97%  Weight: 189 lb (85.7 kg)  Height: 5\' 3"  (1.6 m)   Wt Readings from Last 3 Encounters:  06/24/17 189 lb (85.7 kg)  06/17/17 180 lb 16 oz (82.1 kg)  05/11/17 187 lb 6 oz (85 kg)    Lab Results  Component Value Date   CREATININE 8.69 (H) 06/17/2017   CREATININE 9.67 (H) 06/16/2017   CREATININE 8.68 (H) 06/15/2017      Assessment & Plan:  1: Chronic heart failure with preserved ejection fraction-   - NYHA class II - mildly fluid overloaded today  - weighing daily; Reminded to call for an overnight weight gain of >2 pounds or a weekly weight gain of >5 pounds; weight unchanged from the last time she was here - maintaining a 32 ounce fluid restriction per nephrology  - elevate legs when sitting for long periods of time to help decrease edema - wearing oxygent at 2L at bedtime and during dialysis - saw cardiologist Rockey Situ) 01/14/17 - not adding salt and is trying to eat low sodium foods. Reminded about the importance of following a 2000mg  sodium diet.  - does eat doughnuts, cookies and potato chips on occasion due to finances  2: HTN-  - BP elevated today and she says that it's been running high at home - she says that she's taken her medications today and, in fact, took all her hydralazine this morning at one time. Advised her to take her hydralazine as  1 tablet three times daily as ordered and not all at one time - will increase her losartan to 50mg  daily. She will finish her current bottle by taking 2 of the 25mg  tablets daily until they are gone and then begin the 50mg  tablet as once daily  3.ESRD on dialysis-  - dialysis on T, TH, Sat - BMP  from 06/17/17 reviewed and it shows sodium 133, potassium 4.7, creatinine 8.69 and GFR of 4  4: Lymphedema- - stage 2 - has worn compression socks but says that they "cut into" her leg and make her legs hurt - does elevate her legs numerous times during the day but her legs remain swollen - she has been measured for the lymphapress compression boots - due to dialysis 3 days/week, patient is unable to exercise consistently. When she does walk for exercise, her edema continues.  Patient did not bring her medications nor a list. Each medication was verbally reviewed with the patient and she was encouraged to bring the bottles to every visit to confirm accuracy of list.  Return in 1 month or sooner for any questions/problems before then.

## 2017-06-24 NOTE — Patient Instructions (Addendum)
Continue weighing daily and call for an overnight weight gain of > 2 pounds or a weekly weight gain of >5 pounds.  Increase losartan to 50mg  daily. Can finish your current dosage by taking 2 tablets of your 25mg  dosage daily until it is gone and then begin the 50mg  dose.

## 2017-06-28 DIAGNOSIS — Z7682 Awaiting organ transplant status: Principal | ICD-10-CM

## 2017-06-28 DIAGNOSIS — N186 End stage renal disease: Secondary | ICD-10-CM

## 2017-06-28 DIAGNOSIS — Z01818 Encounter for other preprocedural examination: Secondary | ICD-10-CM

## 2017-07-05 ENCOUNTER — Emergency Department: Payer: Medicare Other

## 2017-07-05 ENCOUNTER — Encounter: Payer: Self-pay | Admitting: Emergency Medicine

## 2017-07-05 ENCOUNTER — Emergency Department
Admission: EM | Admit: 2017-07-05 | Discharge: 2017-07-05 | Disposition: A | Discharge status: Home / Self Care | Payer: Medicare Other | Attending: Emergency Medicine | Admitting: Emergency Medicine

## 2017-07-05 DIAGNOSIS — Z7982 Long term (current) use of aspirin: Secondary | ICD-10-CM | POA: Diagnosis not present

## 2017-07-05 DIAGNOSIS — N390 Urinary tract infection, site not specified: Secondary | ICD-10-CM | POA: Diagnosis not present

## 2017-07-05 DIAGNOSIS — Z79899 Other long term (current) drug therapy: Secondary | ICD-10-CM | POA: Diagnosis not present

## 2017-07-05 DIAGNOSIS — J441 Chronic obstructive pulmonary disease with (acute) exacerbation: Secondary | ICD-10-CM | POA: Diagnosis not present

## 2017-07-05 DIAGNOSIS — I509 Heart failure, unspecified: Secondary | ICD-10-CM | POA: Diagnosis not present

## 2017-07-05 DIAGNOSIS — N186 End stage renal disease: Secondary | ICD-10-CM | POA: Diagnosis not present

## 2017-07-05 DIAGNOSIS — E1122 Type 2 diabetes mellitus with diabetic chronic kidney disease: Secondary | ICD-10-CM | POA: Insufficient documentation

## 2017-07-05 DIAGNOSIS — Z87891 Personal history of nicotine dependence: Secondary | ICD-10-CM | POA: Diagnosis not present

## 2017-07-05 DIAGNOSIS — I132 Hypertensive heart and chronic kidney disease with heart failure and with stage 5 chronic kidney disease, or end stage renal disease: Secondary | ICD-10-CM | POA: Diagnosis not present

## 2017-07-05 DIAGNOSIS — R0602 Shortness of breath: Secondary | ICD-10-CM | POA: Diagnosis present

## 2017-07-05 LAB — BASIC METABOLIC PANEL
ANION GAP: 14 (ref 5–15)
BUN: 70 mg/dL — ABNORMAL HIGH (ref 6–20)
CALCIUM: 7.7 mg/dL — AB (ref 8.9–10.3)
CO2: 24 mmol/L (ref 22–32)
Chloride: 103 mmol/L (ref 101–111)
Creatinine, Ser: 10.48 mg/dL — ABNORMAL HIGH (ref 0.44–1.00)
GFR, EST AFRICAN AMERICAN: 4 mL/min — AB (ref >60)
GFR, EST NON AFRICAN AMERICAN: 4 mL/min — AB (ref >60)
GLUCOSE: 90 mg/dL (ref 65–99)
Potassium: 4.6 mmol/L (ref 3.5–5.1)
SODIUM: 141 mmol/L (ref 135–145)

## 2017-07-05 LAB — CBC WITH DIFFERENTIAL/PLATELET
Basophils Absolute: 0.1 10*3/uL (ref 0–0.1)
Basophils Relative: 1 %
EOS PCT: 5 %
Eosinophils Absolute: 0.3 10*3/uL (ref 0–0.7)
HCT: 31.2 % — ABNORMAL LOW (ref 35.0–47.0)
HEMOGLOBIN: 10.2 g/dL — AB (ref 12.0–16.0)
LYMPHS ABS: 1.1 10*3/uL (ref 1.0–3.6)
LYMPHS PCT: 16 %
MCH: 29.6 pg (ref 26.0–34.0)
MCHC: 32.7 g/dL (ref 32.0–36.0)
MCV: 90.7 fL (ref 80.0–100.0)
Monocytes Absolute: 0.4 10*3/uL (ref 0.2–0.9)
Monocytes Relative: 6 %
NEUTROS PCT: 72 %
Neutro Abs: 4.9 10*3/uL (ref 1.4–6.5)
Platelets: 299 10*3/uL (ref 150–440)
RBC: 3.44 MIL/uL — AB (ref 3.80–5.20)
RDW: 21.4 % — ABNORMAL HIGH (ref 11.5–14.5)
WBC: 6.7 10*3/uL (ref 3.6–11.0)

## 2017-07-05 LAB — URINALYSIS, COMPLETE (UACMP) WITH MICROSCOPIC
BILIRUBIN URINE: NEGATIVE
Glucose, UA: 50 mg/dL — AB
HGB URINE DIPSTICK: NEGATIVE
Ketones, ur: NEGATIVE mg/dL
NITRITE: NEGATIVE
PROTEIN: 100 mg/dL — AB
Specific Gravity, Urine: 1.008 (ref 1.005–1.030)
pH: 9 — ABNORMAL HIGH (ref 5.0–8.0)

## 2017-07-05 LAB — BRAIN NATRIURETIC PEPTIDE: B Natriuretic Peptide: 1383 pg/mL — ABNORMAL HIGH (ref 0.0–100.0)

## 2017-07-05 LAB — TROPONIN I: Troponin I: 0.04 ng/mL (ref <0.03)

## 2017-07-05 MED ORDER — IPRATROPIUM-ALBUTEROL 0.5-2.5 (3) MG/3ML IN SOLN
3.0000 mL | Freq: Once | RESPIRATORY_TRACT | Status: AC
  Administered 2017-07-05: 3 mL via RESPIRATORY_TRACT
  Filled 2017-07-05: qty 3

## 2017-07-05 MED ORDER — ALBUTEROL SULFATE (2.5 MG/3ML) 0.083% IN NEBU
2.5000 mg | INHALATION_SOLUTION | Freq: Once | RESPIRATORY_TRACT | Status: AC
  Administered 2017-07-05: 2.5 mg via RESPIRATORY_TRACT
  Filled 2017-07-05: qty 3

## 2017-07-05 MED ORDER — CEFTRIAXONE SODIUM IN DEXTROSE 20 MG/ML IV SOLN
1.0000 g | Freq: Once | INTRAVENOUS | Status: AC
  Administered 2017-07-05: 1 g via INTRAVENOUS
  Filled 2017-07-05: qty 50

## 2017-07-05 MED ORDER — FUROSEMIDE 10 MG/ML IJ SOLN: 20.0000 mg | Freq: Once | INTRAMUSCULAR | Status: DC

## 2017-07-05 MED ORDER — CEPHALEXIN 250 MG PO CAPS: 250.0000 mg | ORAL_CAPSULE | Freq: Two times a day (BID) | ORAL | 0 refills | Status: AC

## 2017-07-05 MED ORDER — PREDNISONE 20 MG PO TABS
60.0000 mg | ORAL_TABLET | Freq: Every day | ORAL | 0 refills | Status: AC
Start: 2017-07-06 — End: 2017-07-11

## 2017-07-05 MED ORDER — METHYLPREDNISOLONE SODIUM SUCC 125 MG IJ SOLR
125.0000 mg | Freq: Once | INTRAMUSCULAR | Status: AC
  Administered 2017-07-05: 125 mg via INTRAVENOUS
  Filled 2017-07-05: qty 2

## 2017-07-05 MED ORDER — FUROSEMIDE 10 MG/ML IJ SOLN
80.0000 mg | Freq: Once | INTRAMUSCULAR | Status: AC
  Administered 2017-07-05: 80 mg via INTRAVENOUS
  Filled 2017-07-05: qty 8

## 2017-07-05 NOTE — ED Notes (Signed)
EMS is looking to see if the O2 tank is in the ambulance per Holley Raring, ED Network engineer.

## 2017-07-05 NOTE — ED Triage Notes (Signed)
Pt to ED via EMS from home with SOB, pt hc of copd, chf, wears 4L o2 chronically. Pt was running errands and ran out of o2 in tank for aprox 7min. Per EMS pt found to be SOB and breath sounds tight, PT was placed on c-pap. Pt in NAD at this time, RT a bedside, pt placed on bi-pap, VS stable. MD at bedside

## 2017-07-05 NOTE — ED Notes (Signed)
Per Dr. Cherylann Banas, Bipap was d/c'd and patient was placed on 4L O2 via [place] as per her home regime. Patient was in agreement with this.

## 2017-07-05 NOTE — ED Notes (Signed)
Family arrived with patient's home oxygen tank.

## 2017-07-05 NOTE — ED Notes (Signed)
Dr. Cherylann Banas aware of troponin of 0.04.

## 2017-07-05 NOTE — ED Notes (Signed)
Patient states she feels "a little more short of breath than usual," and her chest feels tight, but better than upon arrival.

## 2017-07-05 NOTE — ED Notes (Signed)
Patient was discharged by [place] RN, but was not able to leave due to not having an O2 tank with her. Patient states she left her O2 tank in the ambulance. Patient had her home nasal cannula with her and her purse, but patient did arrive on Cpap from the ambulance.

## 2017-07-05 NOTE — ED Provider Notes (Signed)
[facility] Emergency Department Provider Note ____________________________________________   First MD Initiated Contact with Patient 07/05/17 1133     (approximate)  I have reviewed the triage vital signs and the nursing notes.   HISTORY  Chief Complaint Shortness of Breath    HPI Jasmine Holloway is a 56 y.o. female with past medical history as noted below including CHF, COPD, diabetes, end-stage renal disease on dialysis, who presents with shortness of breath, acute onset over the last few hours, severe intensity, and associated with increased swelling to lower extremity's over the last several days.  Patient states that she had dialysis on Saturday and 3 L were taken off.  Otherwise was feeling well until this morning.    Past Medical History:  Diagnosis Date  . Asthma   . CHF (congestive heart failure) ([place])   . COPD (chronic obstructive pulmonary disease) ([place])   . Diabetes mellitus without complication ([place])   . Diastolic heart failure ([place])   . ESRD (end stage renal disease) on dialysis ([place])   . Hypertension   . Neuropathy   . Polycystic kidney disease   . Renal insufficiency     Patient Active Problem List   Diagnosis Date Noted  . Acute CHF (congestive heart failure) ([place]) 06/15/2017  . Lymphedema 05/11/2017  . Diabetes ([place]) 02/10/2017  . Esophageal stricture   . Hypoxia 11/10/2016  . Shortness of breath 10/26/2016  . COPD (chronic obstructive pulmonary disease) ([place]) 08/06/2016  . Hypertensive crisis 08/06/2016  . Atypical chest pain 06/08/2016  . Chronic pain syndrome 06/08/2016  . Anemia 06/08/2016  . CHF exacerbation ([place]) 04/28/2016  . Epigastric pain 03/28/2016  . Chronic diastolic heart failure ([place]) 02/04/2016  . Hoarseness of voice 02/04/2016  . Nicotine dependence, uncomplicated 27/78/2423  . ESRF (end stage renal failure) ([place]) 12/22/2015  . Essential hypertension 12/22/2015  . Mixed hyperlipidemia 12/22/2015  . Knee  pain, left anterior 06/11/2015  . Arm pain, left 05/31/2015  . Pain of left arm 05/31/2015  . Closed bilateral ankle fractures, with routine healing, subsequent encounter 07/24/2014  . Closed bilateral ankle fractures, initial encounter 06/20/2014    Past Surgical History:  Procedure Laterality Date  . ARTERIOVENOUS GRAFT PLACEMENT Right    x3 (R forearm currently used for access)  . BREAST BIOPSY Left 12/03/2015   neg  . COLON SURGERY    . ESOPHAGOGASTRODUODENOSCOPY (EGD) WITH PROPOFOL N/A 02/04/2017   Procedure: ESOPHAGOGASTRODUODENOSCOPY (EGD) WITH PROPOFOL;  Surgeon: Jonathon Bellows, MD;  Location: [facility] ENDOSCOPY;  Service: Endoscopy;  Laterality: N/A;  . PERIPHERAL VASCULAR CATHETERIZATION Right 08/04/2016   Procedure: A/V Shuntogram;  Surgeon: Algernon Huxley, MD;  Location: [place] CV LAB;  Service: Cardiovascular;  Laterality: Right;  . VEIN HARVEST      Prior to Admission medications   Medication Sig Start Date End Date Taking? Authorizing Provider  amitriptyline (ELAVIL) 10 MG tablet Take 10 mg by mouth at bedtime as needed for sleep.   Yes [provider]  amLODipine (NORVASC) 5 MG tablet Take 5 mg daily by mouth.  08/31/14  Yes [provider]  aspirin 81 MG chewable tablet Chew 1 tablet (81 mg total) by mouth daily. 01/17/16  Yes Gladstone Lighter, MD  b complex-vitamin c-folic acid (NEPHRO-VITE) 0.8 MG TABS tablet Take 1 tablet by mouth at bedtime.   Yes [provider]  calcium acetate (PHOSLO) 667 MG capsule Take 1 capsule (667 mg total) by mouth 3 (three) times daily with meals. Patient taking  differently: Take [phone] mg 3 (three) times daily with meals by mouth.  02/02/17  Yes Wieting, Richard, MD  cloNIDine (CATAPRES) 0.1 MG tablet Take 1 tablet (0.1 mg total) by mouth 2 (two) times daily. 01/17/16  Yes Gladstone Lighter, MD  furosemide (LASIX) 80 MG tablet Take 1 tablet (80 mg total) by mouth daily. 03/05/16 11/10/17 Yes Hackney, Otila Kluver A, FNP    gabapentin (NEURONTIN) 300 MG capsule Take 300 mg 2 (two) times daily by mouth.    Yes [provider]  hydrALAZINE (APRESOLINE) 50 MG tablet Take 1 tablet (50 mg total) by mouth 3 (three) times daily. 02/02/17  Yes Wieting, Richard, MD  lidocaine (XYLOCAINE) 5 % ointment Apply topically.   Yes [provider]  losartan (COZAAR) 50 MG tablet Take 1 tablet (50 mg total) daily by mouth. 06/24/17 09/22/17 Yes Hackney, Otila Kluver A, FNP  metoprolol succinate (TOPROL-XL) 50 MG 24 hr tablet Take 50 mg daily by mouth.    Yes [provider]  omeprazole (PRILOSEC) 40 MG capsule Take by mouth daily.  12/20/16  Yes [provider]  rosuvastatin (CRESTOR) 5 MG tablet Take 5 mg daily by mouth.   Yes [provider]  SENSIPAR 30 MG tablet Take 1 tablet (30 mg total) by mouth daily. 07/07/16  Yes Fritzi Mandes, MD  VENTOLIN HFA 108 (90 Base) MCG/ACT inhaler Inhale 2 puffs into the lungs daily as needed.  05/21/16  Yes [provider]  acetaminophen (TYLENOL) 325 MG tablet Take 2 tablets (650 mg total) by mouth every 6 (six) hours as needed for mild pain or headache. Patient taking differently: Take 325 mg every 6 (six) hours as needed by mouth for mild pain or headache.  03/18/16   Gouru, Illene Silver, MD  diphenhydrAMINE (BENADRYL) 25 mg capsule Take 2 capsules (50 mg total) by mouth every 6 (six) hours as needed. 10/16/16   Carrie Mew, MD  nitroGLYCERIN (NITROSTAT) 0.4 MG SL tablet Place 1 tablet (0.4 mg total) under the tongue every 5 (five) minutes as needed for chest pain. 01/14/17 05/11/17  Minna Merritts, MD  ondansetron (ZOFRAN) 4 MG tablet Take 1 tablet (4 mg total) by mouth every 8 (eight) hours as needed for nausea or vomiting. Patient not taking: Reported on 06/16/2017 07/21/16   Daymon Larsen, MD  oxyCODONE-acetaminophen (PERCOCET) 10-325 MG tablet Take 1 tablet by mouth daily as needed for pain.    [provider]  prochlorperazine (COMPAZINE) 10 MG  tablet Take 1 tablet (10 mg total) by mouth every 6 (six) hours as needed for nausea. Patient not taking: Reported on 07/05/2017 10/15/16   Paulette Blanch, MD    Allergies Morphine and related; Strawberry extract; Tramadol; Venofer [iron sucrose]; Vicodin [hydrocodone-acetaminophen]; Buprenorphine hcl; Codeine; and Morphine  Family History  Problem Relation Age of Onset  . Hypertension Brother   . Heart failure Brother   . Hypertension Mother   . Heart disease Mother     Social History Social History   Tobacco Use  . Smoking status: Former Smoker    Packs/day: 0.25    Last attempt to quit: 08/11/2015    Years since quitting: 1.9  . Smokeless tobacco: Never Used  Substance Use Topics  . Alcohol use: No    Alcohol/week: 0.0 oz  . Drug use: No    Review of Systems  Constitutional: No fever. Eyes: No redness. ENT: No neck pain. Cardiovascular: Denies chest pain. Respiratory: Positive for shortness of breath. Gastrointestinal: No nausea, no vomiting.  Genitourinary: Negative for dysuria.  Musculoskeletal: Negative for back pain. Skin: Negative for rash. Neurological: Negative for headache.    ____________________________________________   PHYSICAL EXAM:  VITAL SIGNS: ED Triage Vitals  Enc Vitals Group     BP 07/05/17 1132 (!) 169/93     Pulse Rate 07/05/17 1130 78     Resp 07/05/17 1132 16     Temp 07/05/17 1132 (!) 96.7 F (35.9 C)     Temp Source 07/05/17 1132 Axillary     SpO2 07/05/17 1130 100 %     Weight --      Height --      Head Circumference --      Peak Flow --      Pain Score 07/05/17 1131 8     Pain Loc --      Pain Edu? --      Excl. in [place]? --     Constitutional: Alert and oriented.  Slightly uncomfortable appearing but in no acute distress. Eyes: Conjunctivae are normal.  Head: Atraumatic. Nose: No congestion/rhinnorhea. Mouth/Throat: Mucous membranes are moist.   Neck: Normal range of motion.  Cardiovascular: Normal rate, regular  rhythm. Grossly normal heart sounds.  Good peripheral circulation. Respiratory: Slightly increased respiratory effort.  No retractions. Lungs with bilateral wheeze.  No significant rales. Gastrointestinal: No distention.  Genitourinary: No CVA tenderness. Musculoskeletal: 2+ bilateral lower extremity edema.  Extremities warm and well perfused.  Neurologic:  Normal speech and language. No gross focal neurologic deficits are appreciated.  Skin:  Skin is warm and dry. No rash noted. Psychiatric: Mood and affect are normal. Speech and behavior are normal.  ____________________________________________   LABS (all labs ordered are listed, but only abnormal results are displayed)  Labs Reviewed  BASIC METABOLIC PANEL - Abnormal; Notable for the following components:      Result Value   BUN 70 (*)    Creatinine, Ser 10.48 (*)    Calcium 7.7 (*)    GFR calc non Af Amer 4 (*)    GFR calc Af Amer 4 (*)    All other components within normal limits  CBC WITH DIFFERENTIAL/PLATELET - Abnormal; Notable for the following components:   RBC 3.44 (*)    Hemoglobin 10.2 (*)    HCT 31.2 (*)    RDW 21.4 (*)    All other components within normal limits  BRAIN NATRIURETIC PEPTIDE - Abnormal; Notable for the following components:   B Natriuretic Peptide 1,383.0 (*)    All other components within normal limits  TROPONIN I - Abnormal; Notable for the following components:   Troponin I 0.04 (*)    All other components within normal limits  URINALYSIS, COMPLETE (UACMP) WITH MICROSCOPIC - Abnormal; Notable for the following components:   Color, Urine YELLOW (*)    APPearance TURBID (*)    pH 9.0 (*)    Glucose, UA 50 (*)    Protein, ur 100 (*)    Leukocytes, UA MODERATE (*)    Bacteria, UA MANY (*)    Squamous Epithelial / LPF TOO NUMEROUS TO COUNT (*)    Non Squamous Epithelial 0-5 (*)    All other components within normal limits  BLOOD GAS, VENOUS - Abnormal; Notable for the following components:    pO2, Ven 50.0 (*)    Bicarbonate 30.9 (*)    Acid-Base Excess 3.6 (*)    All other components within normal limits   ____________________________________________  EKG  ED ECG REPORT I, Arta Silence, the  attending physician, personally viewed and interpreted this ECG.  Date: 07/05/2017 EKG Time: 1135 Rate: 79 Rhythm: normal sinus rhythm QRS Axis: normal Intervals: normal ST/T Wave abnormalities: LVH, otherwise nonspecific; no peaked T waves Narrative Interpretation: no evidence of acute ischemia; no significant change when compared to EKG of 03/29/2017  ____________________________________________  RADIOLOGY  CXR: Suggestive of interstitial pulmonary edema, no other acute findings.  ____________________________________________   PROCEDURES  Procedure(s) performed: No    Critical Care performed: Yes  CRITICAL CARE Performed by: Arta Silence   Total critical care time: 35 minutes  Critical care time was exclusive of separately billable procedures and treating other patients.  Critical care was necessary to treat or prevent imminent or life-threatening deterioration.  Critical care was time spent personally by me on the following activities: development of treatment plan with patient and/or surrogate as well as nursing, discussions with consultants, evaluation of patient's response to treatment, examination of patient, obtaining history from patient or surrogate, ordering and performing treatments and interventions, ordering and review of laboratory studies, ordering and review of radiographic studies, pulse oximetry and re-evaluation of patient's condition.  ____________________________________________   INITIAL IMPRESSION / ASSESSMENT AND PLAN / ED COURSE  Pertinent labs & imaging results that were available during my care of the patient were reviewed by me and considered in my medical decision making (see chart for details).  56 year old female with  history of CHF, COPD, diabetes, and ESRD on dialysis presents with acute onset of shortness of breath over the last few hours, found to be hypoxic and given CPAP by EMS.  Per EMS, patient markedly improved on CPAP in the field.  On arrival in the ED, patient slightly uncomfortable appearing but in no acute distress on the CPAP.  She is able to speak in full sentences and answer questions appropriately.  On my lung exam, there are wheezes bilaterally and no significant rales, although patient does have some peripheral edema.  Review of past medical records in epic reveals that patient was admitted earlier this month after shortness of breath and a truncated dialysis session, and was treated for acute on chronic CHF and COPD.  Plan: BiPAP, nebs, steroid, chest x-ray labs to evaluate electrolytes and need for emergent dialysis, diuretics if indicated based on workup, and plan for admission.    ----------------------------------------- 4:10 PM on 07/05/2017 -----------------------------------------  Patient had market improvement in her respiratory status, and after approximately an hour she was taken off the BiPAP and placed on her normal 4 L of O2 by nasal cannula with O2 sat remaining in the high 90s.  Patient is sitting comfortably and using her phone.    The workup reveals elevated BNP although this is actually improved from prior values found on other visits.  Her troponin is minimally indeterminate, which is consistent with her ESRD prior lab values.  She has no concerning electrolyte abnormalities, and her CBC is also unremarkable.  Her chest x-ray shows findings of interstitial pulmonary edema, however clinically she is not grossly fluid overloaded and this is also likely chronic for patient.  I am still pending urinalysis.  Overall I suspect the patient likely could go home if she is feeling comfortable, given her relatively unremarkable workup and her significant clinical improvement.  I  suspect that today's episode is most likely due to COPD, and patient is scheduled for dialysis tomorrow.  However if she has new or worsening shortness of breath or respiratory distress, hypoxia, or does not feel well to go home  I will admit.  ----------------------------------------- 4:49 PM on 07/05/2017 -----------------------------------------  On reassessment, patient states she feels comfortable and would definitely like to go home.  Her vital signs have remained stable; she is slightly hypertensive but her respiratory rate remains around 20, and she is not in respiratory distress.  Her O2 sat on her normal 4 L nasal cannula is in the high 90s.  She did desaturate a bit during exertion per the nurse.  Her UA reveals evidence of possible UTI.  Overall, as noted above I suspect that patient is mainly having COPD exacerbation and is not significantly fluid overloaded.  Given the prior hypoxia and respiratory distress, as well as the presence of UTI, I offered the patient admission.  Patient states that she would strongly prefer to go home, she is getting dialysis tomorrow, and feels much better.  The patient is reasonable and demonstrates appropriate understanding of her medical condition.  I explained that she could have worsening COPD, hypoxia, fluid overload prior to getting dialyzed, or other possible deterioration.  Patient expresses understanding of these risks.  She demonstrates full decision-making capacity.  I will discharge with decreased renal dose of Keflex for the UTI, and patient instructed to continue albuterol and steroid at home.  Return precautions given, and patient expresses understanding.  ____________________________________________   FINAL CLINICAL IMPRESSION(S) / ED DIAGNOSES  Final diagnoses:  COPD exacerbation ([place])  End stage renal disease ([place])  Urinary tract infection without hematuria, site unspecified      NEW MEDICATIONS STARTED DURING THIS VISIT:  This  SmartLink is deprecated. Use AVSMEDLIST instead to display the medication list for a patient.   Note:  This document was prepared using Dragon voice recognition software and may include unintentional dictation errors.     Arta Silence, MD 07/05/17 [phone]

## 2017-07-05 NOTE — Discharge Instructions (Signed)
You should use albuterol by inhaler or nebulizer every 4-6 hours for the next several days, and take the prednisone as prescribed starting tomorrow.  We have also prescribed an antibiotic for your urinary tract infection which she should start tomorrow.  Return to the emergency department immediately for new or worsening difficulty breathing, chest pain, lightheadedness, weakness, fevers, abdominal pain, or any other new or worsening symptoms that concern you.  You should go to your dialysis tomorrow as scheduled, and follow-up with your regular doctor within the next week.

## 2017-07-05 NOTE — ED Notes (Signed)
Two unsuccessful IV attempts by this Probation officer. Will defer to medic. Dr. Cherylann Banas aware.

## 2017-07-05 NOTE — ED Notes (Signed)
Patient became more shortness of breath when using the bedpan. Patient gave a scant amount of cloudy urine.

## 2017-07-09 LAB — BLOOD GAS, VENOUS
Acid-Base Excess: 3.6 mmol/L — ABNORMAL HIGH (ref 0.0–2.0)
Bicarbonate: 30.9 mmol/L — ABNORMAL HIGH (ref 20.0–28.0)
O2 SAT: 81.6 %
PATIENT TEMPERATURE: 37
PH VEN: 7.32 (ref 7.250–7.430)
pCO2, Ven: 60 mmHg (ref 44.0–60.0)
pO2, Ven: 50 mmHg — ABNORMAL HIGH (ref 32.0–45.0)

## 2017-07-21 ENCOUNTER — Emergency Department
Admission: EM | Admit: 2017-07-21 | Discharge: 2017-07-21 | Disposition: A | Discharge status: Home / Self Care | Payer: Medicare Other | Attending: Emergency Medicine | Admitting: Emergency Medicine

## 2017-07-21 ENCOUNTER — Other Ambulatory Visit: Payer: Self-pay

## 2017-07-21 ENCOUNTER — Emergency Department: Payer: Medicare Other

## 2017-07-21 ENCOUNTER — Encounter: Payer: Self-pay | Admitting: Emergency Medicine

## 2017-07-21 DIAGNOSIS — R079 Chest pain, unspecified: Secondary | ICD-10-CM | POA: Diagnosis present

## 2017-07-21 DIAGNOSIS — N186 End stage renal disease: Secondary | ICD-10-CM | POA: Diagnosis not present

## 2017-07-21 DIAGNOSIS — G8929 Other chronic pain: Secondary | ICD-10-CM | POA: Insufficient documentation

## 2017-07-21 DIAGNOSIS — I132 Hypertensive heart and chronic kidney disease with heart failure and with stage 5 chronic kidney disease, or end stage renal disease: Secondary | ICD-10-CM | POA: Diagnosis not present

## 2017-07-21 DIAGNOSIS — J45909 Unspecified asthma, uncomplicated: Secondary | ICD-10-CM | POA: Insufficient documentation

## 2017-07-21 DIAGNOSIS — J811 Chronic pulmonary edema: Secondary | ICD-10-CM | POA: Diagnosis not present

## 2017-07-21 DIAGNOSIS — J449 Chronic obstructive pulmonary disease, unspecified: Secondary | ICD-10-CM | POA: Diagnosis not present

## 2017-07-21 DIAGNOSIS — Z992 Dependence on renal dialysis: Secondary | ICD-10-CM | POA: Insufficient documentation

## 2017-07-21 DIAGNOSIS — I5032 Chronic diastolic (congestive) heart failure: Secondary | ICD-10-CM | POA: Diagnosis not present

## 2017-07-21 DIAGNOSIS — Z7982 Long term (current) use of aspirin: Secondary | ICD-10-CM | POA: Diagnosis not present

## 2017-07-21 DIAGNOSIS — Z87891 Personal history of nicotine dependence: Secondary | ICD-10-CM | POA: Diagnosis not present

## 2017-07-21 DIAGNOSIS — E1122 Type 2 diabetes mellitus with diabetic chronic kidney disease: Secondary | ICD-10-CM | POA: Diagnosis not present

## 2017-07-21 DIAGNOSIS — Z79899 Other long term (current) drug therapy: Secondary | ICD-10-CM | POA: Insufficient documentation

## 2017-07-21 DIAGNOSIS — R609 Edema, unspecified: Secondary | ICD-10-CM | POA: Insufficient documentation

## 2017-07-21 LAB — CBC WITH DIFFERENTIAL/PLATELET
BASOS PCT: 1 %
Basophils Absolute: 0.1 10*3/uL (ref 0–0.1)
EOS ABS: 0.2 10*3/uL (ref 0–0.7)
Eosinophils Relative: 3 %
HCT: 33.6 % — ABNORMAL LOW (ref 35.0–47.0)
Hemoglobin: 10.8 g/dL — ABNORMAL LOW (ref 12.0–16.0)
LYMPHS ABS: 0.9 10*3/uL — AB (ref 1.0–3.6)
Lymphocytes Relative: 14 %
MCH: 29.3 pg (ref 26.0–34.0)
MCHC: 32 g/dL (ref 32.0–36.0)
MCV: 91.5 fL (ref 80.0–100.0)
Monocytes Absolute: 0.7 10*3/uL (ref 0.2–0.9)
Monocytes Relative: 11 %
NEUTROS ABS: 4.9 10*3/uL (ref 1.4–6.5)
NEUTROS PCT: 71 %
PLATELETS: 211 10*3/uL (ref 150–440)
RBC: 3.68 MIL/uL — ABNORMAL LOW (ref 3.80–5.20)
RDW: 22.8 % — AB (ref 11.5–14.5)
WBC: 6.8 10*3/uL (ref 3.6–11.0)

## 2017-07-21 LAB — BASIC METABOLIC PANEL
Anion gap: 11 (ref 5–15)
BUN: 43 mg/dL — AB (ref 6–20)
CO2: 31 mmol/L (ref 22–32)
CREATININE: 8.07 mg/dL — AB (ref 0.44–1.00)
Calcium: 8.2 mg/dL — ABNORMAL LOW (ref 8.9–10.3)
Chloride: 99 mmol/L — ABNORMAL LOW (ref 101–111)
GFR calc Af Amer: 6 mL/min — ABNORMAL LOW (ref >60)
GFR, EST NON AFRICAN AMERICAN: 5 mL/min — AB (ref >60)
Glucose, Bld: 86 mg/dL (ref 65–99)
Potassium: 4.8 mmol/L (ref 3.5–5.1)
SODIUM: 141 mmol/L (ref 135–145)

## 2017-07-21 LAB — TROPONIN I
TROPONIN I: 0.06 ng/mL — AB (ref <0.03)
Troponin I: 0.05 ng/mL (ref <0.03)

## 2017-07-21 MED ORDER — ASPIRIN 81 MG PO CHEW
324.0000 mg | CHEWABLE_TABLET | Freq: Once | ORAL | Status: AC
  Administered 2017-07-21: 324 mg via ORAL
  Filled 2017-07-21: qty 4

## 2017-07-21 MED ORDER — NITROGLYCERIN 0.4 MG SL SUBL
0.4000 mg | SUBLINGUAL_TABLET | SUBLINGUAL | Status: DC | PRN
  Administered 2017-07-21: 0.4 mg via SUBLINGUAL
  Filled 2017-07-21: qty 1

## 2017-07-21 NOTE — ED Triage Notes (Signed)
Patient to ED via [facility] from [facility] dialysis. Patient complaining of generalized chest pain and SOB. Patient was unable to have treatment due to symptoms. Reports she has extra fluid so had treatment yesterday and was supposed to have a second one today. Patient also reports not taking morning medication. Alert and oriented x4 upon arrival.

## 2017-07-21 NOTE — ED Provider Notes (Signed)
[facility] Emergency Department Provider Note   ____________________________________________   First MD Initiated Contact with Patient 07/21/17 0720     (approximate)  I have reviewed the triage vital signs and the nursing notes.   HISTORY  Chief Complaint Chest Pain   HPI Jasmine Holloway is a 56 y.o. female with CHF, COPD on home O2 with end-stage renal disease on dialysis who is presenting to the emergency department chest pain as well as shortness of breath.  She says that she has been fluid overloaded and was dialyzed yesterday when she had 3 L taken off.  Says that she was returning today for more fluid removal.  However, prior to dialysis she began having sharp chest pain.  She says the pain is to the center of her chest but radiates across her chest.  Says the pain is sharp and a 6 out of 10 at this time.  She was not dialyzed this morning because she was sent to the emergency department for chest pain.  She says that she is also been feeling short of breath and woke up short of breath the middle of the night last night at 1 AM.  She also says that she is having increased bilateral lower extremity swelling.  Past Medical History:  Diagnosis Date  . Asthma   . CHF (congestive heart failure) ([place])   . COPD (chronic obstructive pulmonary disease) ([place])   . Diabetes mellitus without complication ([place])   . Diastolic heart failure ([place])   . ESRD (end stage renal disease) on dialysis ([place])   . Hypertension   . Neuropathy   . Polycystic kidney disease   . Renal insufficiency     Patient Active Problem List   Diagnosis Date Noted  . Acute CHF (congestive heart failure) ([place]) 06/15/2017  . Lymphedema 05/11/2017  . Diabetes ([place]) 02/10/2017  . Esophageal stricture   . Hypoxia 11/10/2016  . Shortness of breath 10/26/2016  . COPD (chronic obstructive pulmonary disease) ([place]) 08/06/2016  . Hypertensive crisis 08/06/2016  . Atypical chest pain 06/08/2016  .  Chronic pain syndrome 06/08/2016  . Anemia 06/08/2016  . CHF exacerbation ([place]) 04/28/2016  . Epigastric pain 03/28/2016  . Chronic diastolic heart failure ([place]) 02/04/2016  . Hoarseness of voice 02/04/2016  . Nicotine dependence, uncomplicated 48/18/5631  . ESRF (end stage renal failure) ([place]) 12/22/2015  . Essential hypertension 12/22/2015  . Mixed hyperlipidemia 12/22/2015  . Knee pain, left anterior 06/11/2015  . Arm pain, left 05/31/2015  . Pain of left arm 05/31/2015  . Closed bilateral ankle fractures, with routine healing, subsequent encounter 07/24/2014  . Closed bilateral ankle fractures, initial encounter 06/20/2014    Past Surgical History:  Procedure Laterality Date  . ARTERIOVENOUS GRAFT PLACEMENT Right    x3 (R forearm currently used for access)  . BREAST BIOPSY Left 12/03/2015   neg  . COLON SURGERY    . ESOPHAGOGASTRODUODENOSCOPY (EGD) WITH PROPOFOL N/A 02/04/2017   Procedure: ESOPHAGOGASTRODUODENOSCOPY (EGD) WITH PROPOFOL;  Surgeon: Jonathon Bellows, MD;  Location: [facility] ENDOSCOPY;  Service: Endoscopy;  Laterality: N/A;  . PERIPHERAL VASCULAR CATHETERIZATION Right 08/04/2016   Procedure: A/V Shuntogram;  Surgeon: Algernon Huxley, MD;  Location: [facility];  Service: Cardiovascular;  Laterality: Right;  . VEIN HARVEST      Prior to Admission medications   Medication Sig Start Date End Date Taking? Authorizing Provider  acetaminophen (TYLENOL) 325 MG tablet Take 2 tablets (650 mg total) by mouth every 6 (six) hours  as needed for mild pain or headache. Patient taking differently: Take 325 mg every 6 (six) hours as needed by mouth for mild pain or headache.  03/18/16   Gouru, Illene Silver, MD  amitriptyline (ELAVIL) 10 MG tablet Take 10 mg by mouth at bedtime as needed for sleep.    [provider]  amLODipine (NORVASC) 5 MG tablet Take 5 mg daily by mouth.  08/31/14   [provider]  aspirin 81 MG chewable tablet Chew 1 tablet (81 mg total) by mouth daily.  01/17/16   Gladstone Lighter, MD  b complex-vitamin c-folic acid (NEPHRO-VITE) 0.8 MG TABS tablet Take 1 tablet by mouth at bedtime.    [provider]  calcium acetate (PHOSLO) 667 MG capsule Take 1 capsule (667 mg total) by mouth 3 (three) times daily with meals. Patient taking differently: Take 667-1,334 mg 3 (three) times daily with meals by mouth.  02/02/17   Loletha Grayer, MD  cloNIDine (CATAPRES) 0.1 MG tablet Take 1 tablet (0.1 mg total) by mouth 2 (two) times daily. 01/17/16   Gladstone Lighter, MD  diphenhydrAMINE (BENADRYL) 25 mg capsule Take 2 capsules (50 mg total) by mouth every 6 (six) hours as needed. 10/16/16   Carrie Mew, MD  furosemide (LASIX) 80 MG tablet Take 1 tablet (80 mg total) by mouth daily. 03/05/16 11/10/17  Alisa Graff, FNP  gabapentin (NEURONTIN) 300 MG capsule Take 300 mg 2 (two) times daily by mouth.     [provider]  hydrALAZINE (APRESOLINE) 50 MG tablet Take 1 tablet (50 mg total) by mouth 3 (three) times daily. 02/02/17   Loletha Grayer, MD  lidocaine (XYLOCAINE) 5 % ointment Apply topically.    [provider]  losartan (COZAAR) 50 MG tablet Take 1 tablet (50 mg total) daily by mouth. 06/24/17 09/22/17  Alisa Graff, FNP  metoprolol succinate (TOPROL-XL) 50 MG 24 hr tablet Take 50 mg daily by mouth.     [provider]  nitroGLYCERIN (NITROSTAT) 0.4 MG SL tablet Place 1 tablet (0.4 mg total) under the tongue every 5 (five) minutes as needed for chest pain. 01/14/17 05/11/17  Minna Merritts, MD  omeprazole (PRILOSEC) 40 MG capsule Take by mouth daily.  12/20/16   [provider]  ondansetron (ZOFRAN) 4 MG tablet Take 1 tablet (4 mg total) by mouth every 8 (eight) hours as needed for nausea or vomiting. Patient not taking: Reported on 06/16/2017 07/21/16   Daymon Larsen, MD  oxyCODONE-acetaminophen (PERCOCET) 10-325 MG tablet Take 1 tablet by mouth daily as needed for pain.    [provider]    prochlorperazine (COMPAZINE) 10 MG tablet Take 1 tablet (10 mg total) by mouth every 6 (six) hours as needed for nausea. Patient not taking: Reported on 07/05/2017 10/15/16   Paulette Blanch, MD  rosuvastatin (CRESTOR) 5 MG tablet Take 5 mg daily by mouth.    [provider]  SENSIPAR 30 MG tablet Take 1 tablet (30 mg total) by mouth daily. 07/07/16   Fritzi Mandes, MD  VENTOLIN HFA 108 (90 Base) MCG/ACT inhaler Inhale 2 puffs into the lungs daily as needed.  05/21/16   [provider]    Allergies Morphine and related; Strawberry extract; Tramadol; Venofer [iron sucrose]; Vicodin [hydrocodone-acetaminophen]; Buprenorphine hcl; Codeine; and Morphine  Family History  Problem Relation Age of Onset  . Hypertension Brother   . Heart failure Brother   . Hypertension Mother   . Heart disease Mother     Social  History Social History   Tobacco Use  . Smoking status: Former Smoker    Packs/day: 0.25    Last attempt to quit: 08/11/2015    Years since quitting: 1.9  . Smokeless tobacco: Never Used  Substance Use Topics  . Alcohol use: No    Alcohol/week: 0.0 oz  . Drug use: No    Review of Systems  Constitutional: No fever/chills Eyes: No visual changes. ENT: No sore throat. Cardiovascular: As above Respiratory: As above  gastrointestinal: No abdominal pain.  No nausea, no vomiting.  No diarrhea.  No constipation. Genitourinary: Negative for dysuria. Musculoskeletal: Negative for back pain. Skin: Negative for rash. Neurological: Negative for headaches, focal weakness or numbness.   ____________________________________________   PHYSICAL EXAM:  VITAL SIGNS: ED Triage Vitals  Enc Vitals Group     BP 07/21/17 0726 (!) 191/100     Pulse Rate 07/21/17 0726 66     Resp 07/21/17 0726 18     Temp 07/21/17 0726 98.3 F (36.8 C)     Temp Source 07/21/17 0726 Oral     SpO2 07/21/17 0726 91 %     Weight 07/21/17 0726 183 lb (83 kg)     Height 07/21/17 0726 5\' 3"   (1.6 m)     Head Circumference --      Peak Flow --      Pain Score 07/21/17 0725 6     Pain Loc --      Pain Edu? --      Excl. in [place]? --     Constitutional: Alert and oriented. Well appearing and in no acute distress. Eyes: Conjunctivae are normal.  Head: Atraumatic. Nose: No congestion/rhinnorhea. Mouth/Throat: Mucous membranes are moist.  Neck: No stridor.   Cardiovascular: Normal rate, regular rhythm. Grossly normal heart sounds.  Good peripheral circulation with palpable thrill to the right forearm fistula.  Chest pain is reproducible to palpation. Respiratory: Normal respiratory effort.  No retractions. Lungs CTAB. Gastrointestinal: Soft and nontender. No distention.  Musculoskeletal: No lower extremity tenderness nor edema.  No joint effusions. Neurologic:  Normal speech and language. No gross focal neurologic deficits are appreciated. Skin:  Skin is warm, dry and intact. No rash noted. Psychiatric: Mood and affect are normal. Speech and behavior are normal.  ____________________________________________   LABS (all labs ordered are listed, but only abnormal results are displayed)  Labs Reviewed  CBC WITH DIFFERENTIAL/PLATELET - Abnormal; Notable for the following components:      Result Value   RBC 3.68 (*)    Hemoglobin 10.8 (*)    HCT 33.6 (*)    RDW 22.8 (*)    Lymphs Abs 0.9 (*)    All other components within normal limits  BASIC METABOLIC PANEL - Abnormal; Notable for the following components:   Chloride 99 (*)    BUN 43 (*)    Creatinine, Ser 8.07 (*)    Calcium 8.2 (*)    GFR calc non Af Amer 5 (*)    GFR calc Af Amer 6 (*)    All other components within normal limits  TROPONIN I - Abnormal; Notable for the following components:   Troponin I 0.06 (*)    All other components within normal limits  TROPONIN I   ____________________________________________  EKG  ED ECG REPORT I, Doran Stabler, the attending physician, personally viewed and  interpreted this ECG.   Date: 07/21/2017  EKG Time: 0722  Rate: 66  Rhythm: normal sinus rhythm  Axis: Normal  Intervals:none  ST&T Change: No ST segment elevation or depression.  T wave inversions in aVL.  Mild T wave peaking in V3 through 5.  LVH. No significant change from previous. ____________________________________________  RADIOLOGY  Improved CHF ____________________________________________   PROCEDURES  Procedure(s) performed:   Procedures  Critical Care performed:   ____________________________________________   INITIAL IMPRESSION / ASSESSMENT AND PLAN / ED COURSE  Pertinent labs & imaging results that were available during my care of the patient were reviewed by me and considered in my medical decision making (see chart for details).  Differential includes, but is not limited to, viral syndrome, bronchitis including COPD exacerbation, pneumonia, reactive airway disease including asthma, CHF including exacerbation with or without pulmonary/interstitial edema, pneumothorax, ACS, thoracic trauma, and pulmonary embolism.  As part of my medical decision making, I reviewed the following data within the electronic MEDICAL RECORD NUMBER Notes from prior ED visits  ----------------------------------------- 8:53 AM on 07/21/2017 -----------------------------------------  Patient chest pain-free after aspirin and nitro.  Says that she does get chest pain usually when she is overloaded.  Initial troponin consistent with previous levels.  We will repeat the troponin.  If the patient remains asymptomatic and the troponin remained stable the plan will be to discharge and have her dialyzed later today.  Likely what she needs most is to have the extra fluid taken off.   ----------------------------------------- 12:50 PM on 07/21/2017 -----------------------------------------  Patient with decreased troponin at 0.05.  Continues to be chest pain-free.  I discussed the case with  Dr. Zollie Scale who agrees that the patient can be sent over for dialysis later in the afternoon.  He says that she should be to the [facility] dialysis center by 4 PM.  We will be discharging the patient now and she will be sent in a cab to dialysis.  She is understanding of the plan and willing to comply.  She says that when she gets fluid overloaded she gets sometimes with a similar chest pain that she had today.  Likely cause of chest pain today as fluid overload.     ____________________________________________   FINAL CLINICAL IMPRESSION(S) / ED DIAGNOSES  Peripheral edema.  Chest pain.    NEW MEDICATIONS STARTED DURING THIS VISIT:  This SmartLink is deprecated. Use AVSMEDLIST instead to display the medication list for a patient.   Note:  This document was prepared using Dragon voice recognition software and may include unintentional dictation errors.     Orbie Pyo, MD 07/21/17 [phone]

## 2017-07-27 ENCOUNTER — Ambulatory Visit: Payer: Medicare Other | Admitting: Family

## 2017-07-27 NOTE — Progress Notes (Deleted)
Subjective:    Patient ID: Jasmine Holloway, female    DOB: 06/21/1961, 56 y.o.   MRN: [id number]  HPI   Ms Ferrebee is a 56 y/o female with a history of asthma, DM, HTN, kidney disease (dialysis), COPD, previous tobacco use and chronic heart failure.   Reviewed echo report on 11/10/16 which showed an EF of 50-55% along with mild AR/MR. EF was 55-60% May 2017. Cardiac catheterization was done 03/17/16 and showed normal coronary arteries.   Was in the ED 07/21/17 with edema where she was treated and released. Was in the ED 07/05/17 with COPD exacerbation. Was given bipap, nebulizers and steroids. Was released with oral antibiotics. Admitted 06/15/17 due to HF exacerbation. Chest CT confirmed HF. Initially needed IV furosemide and then transitioned back to her oral diuretics. Also needed IV steroids and nebulizers initially. Nephrology was consulted and she was discharged home in 2 days. Was in the ED 04/10/17 but left without being seen. Was in the ED 04/09/17 and 03/29/17 with epistaxis. Both times she was was treated and released. Was in the ED 02/24/17 with shortness of breath. She was treated and release for her next day dialysis. Was in the ED 02/09/17 due to migraine and she was treated and released.  Admitted 02/01/17 with pulmonary edema. Initially needed bipap. BP was elevated initially. Elevated troponin thought to be due to demand ischemia. Continued with dialysis. Discharged the following day.   She presents today for a follow-up visit with a chief complaint of   Past Medical History:  Diagnosis Date  . Asthma   . CHF (congestive heart failure) ([place])   . COPD (chronic obstructive pulmonary disease) ([place])   . Diabetes mellitus without complication ([place])   . Diastolic heart failure ([place])   . ESRD (end stage renal disease) on dialysis ([place])   . Hypertension   . Neuropathy   . Polycystic kidney disease   . Renal insufficiency     Past Surgical History:  Procedure Laterality Date  . ARTERIOVENOUS  GRAFT PLACEMENT Right    x3 (R forearm currently used for access)  . BREAST BIOPSY Left 12/03/2015   neg  . COLON SURGERY    . ESOPHAGOGASTRODUODENOSCOPY (EGD) WITH PROPOFOL N/A 02/04/2017   Procedure: ESOPHAGOGASTRODUODENOSCOPY (EGD) WITH PROPOFOL;  Surgeon: Jonathon Bellows, MD;  Location: [facility] ENDOSCOPY;  Service: Endoscopy;  Laterality: N/A;  . PERIPHERAL VASCULAR CATHETERIZATION Right 08/04/2016   Procedure: A/V Shuntogram;  Surgeon: Algernon Huxley, MD;  Location: [facility];  Service: Cardiovascular;  Laterality: Right;  . VEIN HARVEST      Family History  Problem Relation Age of Onset  . Hypertension Brother   . Heart failure Brother   . Hypertension Mother   . Heart disease Mother     Social History   Tobacco Use  . Smoking status: Former Smoker    Packs/day: 0.25    Last attempt to quit: 08/11/2015    Years since quitting: 1.9  . Smokeless tobacco: Never Used  Substance Use Topics  . Alcohol use: No    Alcohol/week: 0.0 oz    Allergies  Allergen Reactions  . Morphine And Related Nausea And Vomiting  . Strawberry Extract   . Tramadol Nausea And Vomiting  . Venofer [Iron Sucrose]   . Vicodin [Hydrocodone-Acetaminophen] Nausea And Vomiting  . Buprenorphine Hcl Nausea And Vomiting  . Codeine Nausea And Vomiting  . Morphine Nausea And Vomiting    Halllucinations     Review of Systems  Constitutional: Negative for appetite change and fatigue.  HENT: Negative for congestion, nosebleeds, sinus pressure and sore throat.   Eyes: Positive for visual disturbance (blurry vision). Negative for pain.  Respiratory: Positive for shortness of breath. Negative for cough and chest tightness.   Cardiovascular: Positive for palpitations and leg swelling. Negative for chest pain.  Gastrointestinal: Negative for abdominal distention and abdominal pain.  Endocrine: Negative.   Genitourinary: Negative.   Musculoskeletal: Negative for back pain and neck pain.  Skin: Negative.    Allergic/Immunologic: Negative.   Neurological: Positive for numbness (tingling down left arm at times due to previous graft) and headaches. Negative for dizziness and light-headedness.  Hematological: Negative for adenopathy. Does not bruise/bleed easily.  Psychiatric/Behavioral: Positive for sleep disturbance (sleeping on 2 pillows). Negative for dysphoric mood. The patient is not nervous/anxious.       Objective:   Physical Exam  Constitutional: She is oriented to person, place, and time. She appears well-developed and well-nourished.  HENT:  Head: Normocephalic and atraumatic.  Neck: Normal range of motion. Neck supple.  Cardiovascular: Normal rate and regular rhythm.  Pulmonary/Chest: Effort normal. She has no wheezes. She has no rales.  Abdominal: Soft. She exhibits no distension. There is no tenderness.  Musculoskeletal: She exhibits edema (2+ pitting edema in left lower leg; 1+ pitting in right lower leg.). She exhibits no tenderness.  Neurological: She is alert and oriented to person, place, and time.  Skin: Skin is warm and dry.  Psychiatric: She has a normal mood and affect. Her behavior is normal. Thought content normal.  Nursing note and vitals reviewed.    Lab Results  Component Value Date   CREATININE 8.07 (H) 07/21/2017   CREATININE 10.48 (H) 07/05/2017   CREATININE 8.69 (H) 06/17/2017      Assessment & Plan:  1: Chronic heart failure with preserved ejection fraction-   - NYHA class II - mildly fluid overloaded today  - weighing daily; Reminded to call for an overnight weight gain of >2 pounds or a weekly weight gain of >5 pounds; weight unchanged from the last time she was here - maintaining a 32 ounce fluid restriction per nephrology  - elevate legs when sitting for long periods of time to help decrease edema - wearing oxygent at 2L at bedtime and during dialysis - saw cardiologist Rockey Situ) 01/14/17 - not adding salt and is trying to eat low sodium foods.  Reminded about the importance of following a 2000mg  sodium diet.  - does eat doughnuts, cookies and potato chips on occasion due to finances  2: HTN-  - BP elevated today and she says that it's been running high at home - she says that she's taken her medications today and, in fact, took all her hydralazine this morning at one time. Advised her to take her hydralazine as 1 tablet three times daily as ordered and not all at one time - will increase her losartan to 50mg  daily. She will finish her current bottle by taking 2 of the 25mg  tablets daily until they are gone and then begin the 50mg  tablet as once daily  3.ESRD on dialysis-  - dialysis on T, TH, Sat - BMP from 07/21/17 reviewed and it shows sodium 141, potassium 4.8, creatinine 8.07 and GFR of 6  4: Lymphedema- - stage 2 - has worn compression socks but says that they "cut into" her leg and make her legs hurt - does elevate her legs numerous times during the day but her legs remain swollen -  she has been measured for the lymphapress compression boots - due to dialysis 3 days/week, patient is unable to exercise consistently. When she does walk for exercise, her edema continues.  Patient did not bring her medications nor a list. Each medication was verbally reviewed with the patient and she was encouraged to bring the bottles to every visit to confirm accuracy of list.

## 2017-08-05 ENCOUNTER — Emergency Department
Admission: EM | Admit: 2017-08-05 | Discharge: 2017-08-05 | Disposition: A | Discharge status: Home / Self Care | Payer: Medicare Other | Attending: Emergency Medicine | Admitting: Emergency Medicine

## 2017-08-05 ENCOUNTER — Other Ambulatory Visit: Payer: Self-pay

## 2017-08-05 DIAGNOSIS — I5030 Unspecified diastolic (congestive) heart failure: Secondary | ICD-10-CM | POA: Insufficient documentation

## 2017-08-05 DIAGNOSIS — Z992 Dependence on renal dialysis: Secondary | ICD-10-CM | POA: Diagnosis not present

## 2017-08-05 DIAGNOSIS — R519 Headache, unspecified: Secondary | ICD-10-CM

## 2017-08-05 DIAGNOSIS — J45909 Unspecified asthma, uncomplicated: Secondary | ICD-10-CM | POA: Diagnosis not present

## 2017-08-05 DIAGNOSIS — N186 End stage renal disease: Secondary | ICD-10-CM | POA: Diagnosis not present

## 2017-08-05 DIAGNOSIS — E119 Type 2 diabetes mellitus without complications: Secondary | ICD-10-CM | POA: Insufficient documentation

## 2017-08-05 DIAGNOSIS — I132 Hypertensive heart and chronic kidney disease with heart failure and with stage 5 chronic kidney disease, or end stage renal disease: Secondary | ICD-10-CM | POA: Diagnosis not present

## 2017-08-05 DIAGNOSIS — Z87891 Personal history of nicotine dependence: Secondary | ICD-10-CM | POA: Insufficient documentation

## 2017-08-05 DIAGNOSIS — R51 Headache: Secondary | ICD-10-CM | POA: Insufficient documentation

## 2017-08-05 LAB — COMPREHENSIVE METABOLIC PANEL
ALT: 14 U/L (ref 14–54)
ANION GAP: 12 (ref 5–15)
AST: 27 U/L (ref 15–41)
Albumin: 3.6 g/dL (ref 3.5–5.0)
Alkaline Phosphatase: 79 U/L (ref 38–126)
BUN: 46 mg/dL — ABNORMAL HIGH (ref 6–20)
CHLORIDE: 96 mmol/L — AB (ref 101–111)
CO2: 30 mmol/L (ref 22–32)
Calcium: 8.3 mg/dL — ABNORMAL LOW (ref 8.9–10.3)
Creatinine, Ser: 9.27 mg/dL — ABNORMAL HIGH (ref 0.44–1.00)
GFR calc non Af Amer: 4 mL/min — ABNORMAL LOW (ref >60)
GFR, EST AFRICAN AMERICAN: 5 mL/min — AB (ref >60)
Glucose, Bld: 90 mg/dL (ref 65–99)
Potassium: 4.5 mmol/L (ref 3.5–5.1)
SODIUM: 138 mmol/L (ref 135–145)
Total Bilirubin: 0.7 mg/dL (ref 0.3–1.2)
Total Protein: 8.5 g/dL — ABNORMAL HIGH (ref 6.5–8.1)

## 2017-08-05 LAB — CBC WITH DIFFERENTIAL/PLATELET
BASOS PCT: 1 %
Basophils Absolute: 0.1 10*3/uL (ref 0–0.1)
EOS ABS: 0.4 10*3/uL (ref 0–0.7)
EOS PCT: 6 %
HCT: 33 % — ABNORMAL LOW (ref 35.0–47.0)
Hemoglobin: 10.8 g/dL — ABNORMAL LOW (ref 12.0–16.0)
LYMPHS ABS: 1.2 10*3/uL (ref 1.0–3.6)
Lymphocytes Relative: 16 %
MCH: 28.8 pg (ref 26.0–34.0)
MCHC: 32.6 g/dL (ref 32.0–36.0)
MCV: 88.3 fL (ref 80.0–100.0)
MONOS PCT: 11 %
Monocytes Absolute: 0.8 10*3/uL (ref 0.2–0.9)
Neutro Abs: 4.9 10*3/uL (ref 1.4–6.5)
Neutrophils Relative %: 66 %
PLATELETS: 410 10*3/uL (ref 150–440)
RBC: 3.74 MIL/uL — AB (ref 3.80–5.20)
RDW: 22.2 % — ABNORMAL HIGH (ref 11.5–14.5)
WBC: 7.4 10*3/uL (ref 3.6–11.0)

## 2017-08-05 LAB — TROPONIN I: Troponin I: 0.05 ng/mL (ref <0.03)

## 2017-08-05 MED ORDER — LORAZEPAM 2 MG/ML IJ SOLN
0.5000 mg | Freq: Once | INTRAMUSCULAR | Status: AC
  Administered 2017-08-05: 0.5 mg via INTRAVENOUS
  Filled 2017-08-05: qty 1

## 2017-08-05 MED ORDER — METOCLOPRAMIDE HCL 5 MG/ML IJ SOLN
10.0000 mg | Freq: Once | INTRAMUSCULAR | Status: AC
  Administered 2017-08-05: 10 mg via INTRAVENOUS
  Filled 2017-08-05: qty 2

## 2017-08-05 MED ORDER — KETOROLAC TROMETHAMINE 30 MG/ML IJ SOLN
30.0000 mg | Freq: Once | INTRAMUSCULAR | Status: AC
  Administered 2017-08-05: 30 mg via INTRAVENOUS
  Filled 2017-08-05: qty 1

## 2017-08-05 MED ORDER — METOCLOPRAMIDE HCL 5 MG PO TABS: 5.0000 mg | ORAL_TABLET | Freq: Two times a day (BID) | ORAL | 1 refills | Status: DC | PRN

## 2017-08-05 NOTE — ED Notes (Signed)
Pt alert and oriented X4, active, cooperative, pt in NAD. RR even and unlabored, color WNL.  Pt informed to return if any life threatening symptoms occur.  Discharge and followup instructions reviewed.  

## 2017-08-05 NOTE — ED Triage Notes (Signed)
Pt to ER via [facility] from dialysis. Pt here for headache 9/10. Headache started yesterday. Pt at dialysis this AM, 10/10 headache-given 650mg  Tylenol approx 7AM. Pt had half of dialysis treatment this AM. Pt hx of migraines. Alert and oriented X 4. Oxygen at baseline, VSS.

## 2017-08-05 NOTE — ED Notes (Signed)
Pt calling her transportation service to see if they will pick her up from hospital.

## 2017-08-05 NOTE — ED Provider Notes (Signed)
[facility] Emergency Department Provider Note       Time seen: ----------------------------------------- 8:16 AM on 08/05/2017 -----------------------------------------   I have reviewed the triage vital signs and the nursing notes.  HISTORY   Chief Complaint Headache    HPI Jasmine Holloway is a 56 y.o. female with a history of CHF, COPD, diabetes and end-stage renal disease on dialysis who presents to the ED for severe headache.  Patient states this headache is not out of the ordinary for her.  It is both frontal and posterior.  Nothing makes it better, light seems to make it worse.  Patient had about half of her dialysis treatment today.  Pain was 10 out of 10 and she was given some Tylenol which may have slightly improve the headache.  Past Medical History:  Diagnosis Date  . Asthma   . CHF (congestive heart failure) ([place])   . COPD (chronic obstructive pulmonary disease) ([place])   . Diabetes mellitus without complication ([place])   . Diastolic heart failure ([place])   . ESRD (end stage renal disease) on dialysis ([place])   . Hypertension   . Neuropathy   . Polycystic kidney disease   . Renal insufficiency     Patient Active Problem List   Diagnosis Date Noted  . Acute CHF (congestive heart failure) ([place]) 06/15/2017  . Lymphedema 05/11/2017  . Diabetes ([place]) 02/10/2017  . Esophageal stricture   . Hypoxia 11/10/2016  . Shortness of breath 10/26/2016  . COPD (chronic obstructive pulmonary disease) ([place]) 08/06/2016  . Hypertensive crisis 08/06/2016  . Atypical chest pain 06/08/2016  . Chronic pain syndrome 06/08/2016  . Anemia 06/08/2016  . CHF exacerbation ([place]) 04/28/2016  . Epigastric pain 03/28/2016  . Chronic diastolic heart failure ([place]) 02/04/2016  . Hoarseness of voice 02/04/2016  . Nicotine dependence, uncomplicated 62/69/4854  . ESRF (end stage renal failure) ([place]) 12/22/2015  . Essential hypertension 12/22/2015  . Mixed hyperlipidemia  12/22/2015  . Knee pain, left anterior 06/11/2015  . Arm pain, left 05/31/2015  . Pain of left arm 05/31/2015  . Closed bilateral ankle fractures, with routine healing, subsequent encounter 07/24/2014  . Closed bilateral ankle fractures, initial encounter 06/20/2014    Past Surgical History:  Procedure Laterality Date  . ARTERIOVENOUS GRAFT PLACEMENT Right    x3 (R forearm currently used for access)  . BREAST BIOPSY Left 12/03/2015   neg  . COLON SURGERY    . ESOPHAGOGASTRODUODENOSCOPY (EGD) WITH PROPOFOL N/A 02/04/2017   Procedure: ESOPHAGOGASTRODUODENOSCOPY (EGD) WITH PROPOFOL;  Surgeon: Jonathon Bellows, MD;  Location: [facility] ENDOSCOPY;  Service: Endoscopy;  Laterality: N/A;  . PERIPHERAL VASCULAR CATHETERIZATION Right 08/04/2016   Procedure: A/V Shuntogram;  Surgeon: Algernon Huxley, MD;  Location: [facility] CV LAB;  Service: Cardiovascular;  Laterality: Right;  . VEIN HARVEST      Allergies Morphine and related; Strawberry extract; Tramadol; Venofer [iron sucrose]; Vicodin [hydrocodone-acetaminophen]; Buprenorphine hcl; Codeine; and Morphine  Social History Social History   Tobacco Use  . Smoking status: Former Smoker    Packs/day: 0.25    Last attempt to quit: 08/11/2015    Years since quitting: 1.9  . Smokeless tobacco: Never Used  Substance Use Topics  . Alcohol use: No    Alcohol/week: 0.0 oz  . Drug use: No    Review of Systems Constitutional: Negative for fever. Cardiovascular: Negative for chest pain. Respiratory: Negative for shortness of breath. Gastrointestinal: Negative for abdominal pain, vomiting and diarrhea. Musculoskeletal: Negative for back pain. Skin: Negative for  rash. Neurological: Positive for headache  All systems negative/normal/unremarkable except as stated in the HPI  ____________________________________________   PHYSICAL EXAM:  VITAL SIGNS: ED Triage Vitals  Enc Vitals Group     BP 08/05/17 0814 (!) 174/104     Pulse Rate 08/05/17 0814  63     Resp 08/05/17 0814 20     Temp 08/05/17 0814 97.9 F (36.6 C)     Temp Source 08/05/17 0814 Oral     SpO2 08/05/17 0814 96 %     Weight 08/05/17 0815 183 lb (83 kg)     Height 08/05/17 0815 5\' 3"  (1.6 m)     Head Circumference --      Peak Flow --      Pain Score 08/05/17 0814 9     Pain Loc --      Pain Edu? --      Excl. in [place]? --     Constitutional: Alert and oriented. Well appearing and in no distress. Eyes: Conjunctivae are normal. Normal extraocular movements. ENT   Head: Normocephalic and atraumatic.   Nose: No congestion/rhinnorhea.   Mouth/Throat: Mucous membranes are moist.   Neck: No stridor. Cardiovascular: Normal rate, regular rhythm. No murmurs, rubs, or gallops.  Right arm AV fistula with palpable thrill Respiratory: Normal respiratory effort without tachypnea nor retractions. Breath sounds are clear and equal bilaterally. No wheezes/rales/rhonchi. Gastrointestinal: Soft and nontender. Normal bowel sounds Musculoskeletal: Nontender with normal range of motion in extremities. No lower extremity tenderness nor edema. Neurologic:  Normal speech and language. No gross focal neurologic deficits are appreciated.  Skin:  Skin is warm, dry and intact. No rash noted. Psychiatric: Mood and affect are normal. Speech and behavior are normal.  ____________________________________________  ED COURSE:  As part of my medical decision making, I reviewed the following data within the [place] History obtained from family if available, nursing notes, old chart and ekg, as well as notes from prior ED visits. Patient presented for headache, we will assess with labs and imaging as indicated at this time. Clinical Course as of Aug 05 1034  Thu Aug 05, 2017  0905 Patient is resting comfortably, without complaint currently  [JW]    Clinical Course User Index [JW] Earleen Newport, MD   Procedures ____________________________________________    LABS (pertinent positives/negatives)  Labs Reviewed  CBC WITH DIFFERENTIAL/PLATELET - Abnormal; Notable for the following components:      Result Value   RBC 3.74 (*)    Hemoglobin 10.8 (*)    HCT 33.0 (*)    RDW 22.2 (*)    All other components within normal limits  COMPREHENSIVE METABOLIC PANEL - Abnormal; Notable for the following components:   Chloride 96 (*)    BUN 46 (*)    Creatinine, Ser 9.27 (*)    Calcium 8.3 (*)    Total Protein 8.5 (*)    GFR calc non Af Amer 4 (*)    GFR calc Af Amer 5 (*)    All other components within normal limits  TROPONIN I - Abnormal; Notable for the following components:   Troponin I 0.05 (*)    All other components within normal limits  ____________________________________________  DIFFERENTIAL DIAGNOSIS   Migraine, tension headache, dialysis related headache, electrolyte abnormality, dehydration, anxiety  FINAL ASSESSMENT AND PLAN  Headache   Plan: Patient had presented for headache. Patient's labs were within normal limits for her.  She did not require any imaging on today's visit.  Currently her headache is improved and she is stable for outpatient follow-up.   Earleen Newport, MD   Note: This note was generated in part or whole with voice recognition software. Voice recognition is usually quite accurate but there are transcription errors that can and very often do occur. I apologize for any typographical errors that were not detected and corrected.     Earleen Newport, MD 08/05/17 [phone]

## 2017-08-05 NOTE — ED Notes (Signed)
Pt sitting in chair at bedside; patient states that she feels anxious. Offered drink, snack, denies. Asked patient what I can do to make her feel better, states she does not know. Lights dimmed. EDP notified.

## 2017-08-05 NOTE — ED Notes (Signed)
Date and time results received: 08/05/17 9:20 AM  Test: troponin Critical Value: 0.05  Name of Provider Notified: Jimmye Norman

## 2017-08-05 NOTE — ED Notes (Signed)
Pt sister came to pick her up. Pt has portable oxygen tank with her. Pt left with all of belongings included suitcase, purse and bag with oxygen.

## 2017-08-16 NOTE — Progress Notes (Signed)
Subjective:    Patient ID: Jasmine Holloway, female    DOB: Jun 26, 1961, 57 y.o.   MRN: [id number]  HPI   Ms [name] is a 57 y/o female with a history of asthma, DM, HTN, kidney disease (dialysis), COPD, previous tobacco use and chronic heart failure.   Reviewed echo report on 11/10/16 which showed an EF of 50-55% along with mild AR/MR. EF was 55-60% May 2017. Cardiac catheterization was done 03/17/16 and showed normal coronary arteries.   Was in the ED 08/05/17 due to headache where she was treated and released. Was in the ED 07/21/17 with edema where she was treated and released. Was in the ED 07/05/17 with COPD exacerbation. Was given bipap, nebulizers and steroids. Was released with oral antibiotics. Admitted 06/15/17 due to HF exacerbation. Chest CT confirmed HF. Initially needed IV furosemide and then transitioned back to her oral diuretics. Also needed IV steroids and nebulizers initially. Nephrology was consulted and she was discharged home in 2 days. Was in the ED 04/10/17 but left without being seen. Was in the ED 04/09/17 and 03/29/17 with epistaxis. Both times she was was treated and released. Was in the ED 02/24/17 with shortness of breath. She was treated and release for her next day dialysis. Was in the ED 02/09/17 due to migraine and she was treated and released.  Admitted 02/01/17 with pulmonary edema. Initially needed bipap. BP was elevated initially. Elevated troponin thought to be due to demand ischemia. Continued with dialysis. Discharged the following day.   She presents today for a follow-up visit with a chief complaint of mild shortness of breath upon moderate exertion. She describes this as chronic in nature having been present for several years. She has associated blurry vision, headache, tingling down the left arm, occasional chest pain, palpitations and difficulty sleeping. She denies any fatigue, cough, edema, abdominal distention or dizziness. Says that she received an iron infusion at  dialysis and she gets a "terrible" headache every time she receives the iron infusion.   Past Medical History:  Diagnosis Date  . Asthma   . CHF (congestive heart failure) ([place])   . COPD (chronic obstructive pulmonary disease) ([place])   . Diabetes mellitus without complication ([place])   . Diastolic heart failure ([place])   . ESRD (end stage renal disease) on dialysis ([place])   . Hypertension   . Neuropathy   . Polycystic kidney disease   . Renal insufficiency     Past Surgical History:  Procedure Laterality Date  . ARTERIOVENOUS GRAFT PLACEMENT Right    x3 (R forearm currently used for access)  . BREAST BIOPSY Left 12/03/2015   neg  . COLON SURGERY    . ESOPHAGOGASTRODUODENOSCOPY (EGD) WITH PROPOFOL N/A 02/04/2017   Procedure: ESOPHAGOGASTRODUODENOSCOPY (EGD) WITH PROPOFOL;  Surgeon: Jonathon Bellows, MD;  Location: [facility] ENDOSCOPY;  Service: Endoscopy;  Laterality: N/A;  . PERIPHERAL VASCULAR CATHETERIZATION Right 08/04/2016   Procedure: A/V Shuntogram;  Surgeon: Algernon Huxley, MD;  Location: [facility] CV LAB;  Service: Cardiovascular;  Laterality: Right;  . VEIN HARVEST      Family History  Problem Relation Age of Onset  . Hypertension Brother   . Heart failure Brother   . Hypertension Mother   . Heart disease Mother     Social History   Tobacco Use  . Smoking status: Former Smoker    Packs/day: 0.25    Last attempt to quit: 08/11/2015    Years since quitting: 2.0  . Smokeless tobacco: Never Used  Substance Use Topics  . Alcohol use: No    Alcohol/week: 0.0 oz    Allergies  Allergen Reactions  . Morphine And Related Nausea And Vomiting  . Strawberry Extract   . Tramadol Nausea And Vomiting  . Venofer [Iron Sucrose]   . Vicodin [Hydrocodone-Acetaminophen] Nausea And Vomiting  . Buprenorphine Hcl Nausea And Vomiting  . Codeine Nausea And Vomiting  . Morphine Nausea And Vomiting    Halllucinations   Prior to Admission medications   Medication Sig Start Date End Date  Taking? Authorizing Provider  acetaminophen (TYLENOL) 325 MG tablet Take 2 tablets (650 mg total) by mouth every 6 (six) hours as needed for mild pain or headache. Patient taking differently: Take 325 mg every 6 (six) hours as needed by mouth for mild pain or headache.  03/18/16  Yes Gouru, Aruna, MD  amitriptyline (ELAVIL) 10 MG tablet Take 10 mg by mouth at bedtime as needed for sleep.   Yes [provider]  amLODipine (NORVASC) 5 MG tablet Take 5 mg daily by mouth.  08/31/14  Yes [provider]  aspirin 81 MG chewable tablet Chew 1 tablet (81 mg total) by mouth daily. 01/17/16  Yes Gladstone Lighter, MD  b complex-vitamin c-folic acid (NEPHRO-VITE) 0.8 MG TABS tablet Take 1 tablet by mouth at bedtime.   Yes [provider]  calcium acetate (PHOSLO) 667 MG capsule Take 1 capsule (667 mg total) by mouth 3 (three) times daily with meals. Patient taking differently: Take 1,334 mg by mouth 3 (three) times daily with meals.  02/02/17  Yes Wieting, Richard, MD  cloNIDine (CATAPRES) 0.1 MG tablet Take 1 tablet (0.1 mg total) by mouth 2 (two) times daily. 01/17/16  Yes Gladstone Lighter, MD  diphenhydrAMINE (BENADRYL) 25 mg capsule Take 2 capsules (50 mg total) by mouth every 6 (six) hours as needed. 10/16/16  Yes Carrie Mew, MD  furosemide (LASIX) 80 MG tablet Take 1 tablet (80 mg total) by mouth daily. 03/05/16 11/10/17 Yes Felissa Blouch, Otila Kluver A, FNP  gabapentin (NEURONTIN) 300 MG capsule Take 300 mg 2 (two) times daily by mouth.    Yes [provider]  hydrALAZINE (APRESOLINE) 50 MG tablet Take 1 tablet (50 mg total) by mouth 3 (three) times daily. 02/02/17  Yes Wieting, Richard, MD  lidocaine (XYLOCAINE) 5 % ointment Apply topically.   Yes [provider]  losartan (COZAAR) 50 MG tablet Take 1 tablet (50 mg total) daily by mouth. 06/24/17 09/22/17 Yes Laree Garron, Aura Fey, FNP  metoCLOPramide (REGLAN) 5 MG tablet Take 1 tablet (5 mg total) by mouth 2 (two) times daily as  needed (nausea, vomiting or headache). 08/05/17 08/05/18 Yes Earleen Newport, MD  metoprolol succinate (TOPROL-XL) 50 MG 24 hr tablet Take 50 mg daily by mouth.    Yes [provider]  nitroGLYCERIN (NITROSTAT) 0.4 MG SL tablet Place 1 tablet (0.4 mg total) under the tongue every 5 (five) minutes as needed for chest pain. 01/14/17 08/17/17 Yes Gollan, Kathlene November, MD  omeprazole (PRILOSEC) 20 MG capsule Take 1 capsule by mouth daily. 08/06/17  Yes [provider]  ondansetron (ZOFRAN) 4 MG tablet Take 1 tablet (4 mg total) by mouth every 8 (eight) hours as needed for nausea or vomiting. 07/21/16  Yes Daymon Larsen, MD  oxyCODONE-acetaminophen (PERCOCET) 10-325 MG tablet Take 1 tablet by mouth daily as needed for pain.   Yes [provider]  prochlorperazine (COMPAZINE) 10 MG tablet Take 1 tablet (10 mg total) by mouth every 6 (  six) hours as needed for nausea. 10/15/16  Yes Paulette Blanch, MD  SENSIPAR 30 MG tablet Take 1 tablet (30 mg total) by mouth daily. 07/07/16  Yes Fritzi Mandes, MD  VENTOLIN HFA 108 (90 Base) MCG/ACT inhaler Inhale 2 puffs into the lungs daily as needed.  05/21/16  Yes [provider]   Review of Systems  Constitutional: Negative for appetite change and fatigue.  HENT: Negative for congestion, nosebleeds, sinus pressure and sore throat.   Eyes: Positive for visual disturbance (blurry vision). Negative for pain.  Respiratory: Positive for shortness of breath. Negative for cough and chest tightness.   Cardiovascular: Positive for chest pain (last week & resolved after 2 NTG) and palpitations. Negative for leg swelling.  Gastrointestinal: Negative for abdominal distention and abdominal pain.  Endocrine: Negative.   Genitourinary: Negative.   Musculoskeletal: Negative for back pain and neck pain.  Skin: Negative.   Allergic/Immunologic: Negative.   Neurological: Positive for numbness (tingling down left arm at times due to previous graft) and  headaches. Negative for dizziness and light-headedness.  Hematological: Negative for adenopathy. Does not bruise/bleed easily.  Psychiatric/Behavioral: Positive for sleep disturbance (sleeping on 2 pillows). Negative for dysphoric mood. The patient is not nervous/anxious.    Vitals:   08/17/17 1102  BP: (!) 180/89  Pulse: 74  Resp: 18  SpO2: 95%  Weight: 185 lb 2 oz (84 kg)  Height: 5\' 3"  (1.6 m)   Wt Readings from Last 3 Encounters:  08/17/17 185 lb 2 oz (84 kg)  08/05/17 183 lb (83 kg)  07/21/17 183 lb (83 kg)   Lab Results  Component Value Date   CREATININE 9.27 (H) 08/05/2017   CREATININE 8.07 (H) 07/21/2017   CREATININE 10.48 (H) 07/05/2017      Objective:   Physical Exam  Constitutional: She is oriented to person, place, and time. She appears well-developed and well-nourished.  HENT:  Head: Normocephalic and atraumatic.  Neck: Normal range of motion. Neck supple.  Cardiovascular: Normal rate and regular rhythm.  Pulmonary/Chest: Effort normal. She has no wheezes. She has no rales.  Abdominal: Soft. She exhibits no distension. There is no tenderness.  Musculoskeletal: She exhibits no edema or tenderness.  Neurological: She is alert and oriented to person, place, and time.  Skin: Skin is warm and dry.  Psychiatric: She has a normal mood and affect. Her behavior is normal. Thought content normal.  Nursing note and vitals reviewed.     Assessment & Plan:  1: Chronic heart failure with preserved ejection fraction-   - NYHA class II - euvolemic today  - weighing daily; Reminded to call for an overnight weight gain of >2 pounds or a weekly weight gain of >5 pounds - maintaining a 32 ounce fluid restriction per nephrology  - wearing oxygent at 4L at bedtime and during dialysis - saw cardiologist Rockey Situ) 01/14/17 - not adding salt and is trying to eat low sodium foods. Reminded about the importance of following a 2000mg  sodium diet.  - does eat doughnuts, cookies and  potato chips on occasion due to finances  2: HTN-  - BP elevated  - says that it's probably elevated today because she currently has a headache due to iron infusion earlier today - says that she is going to go home and go to bed and her BP will come down  3.ESRD on dialysis-  - dialysis on T, TH, Sat - BMP from 08/05/17 reviewed and it shows sodium 138, potassium 4.5, creatinine 9.27  and GFR of 5  4: Lymphedema- - stage 2 - has worn compression socks but says that they "cut into" her legs and make her legs hurt - does elevate her legs numerous times during the day - has been measured for the lymphapress compression boots - due to dialysis 3 days/week, patient is unable to exercise consistently. When she does walk for exercise, her edema continues.  Patient did not bring her medications nor a list. Each medication was verbally reviewed with the patient and she was encouraged to bring the bottles to every visit to confirm accuracy of list.  Return in 3 months or sooner for any questions/problems before then.

## 2017-08-17 ENCOUNTER — Encounter: Admit: 2017-08-17 | Discharge: 2017-08-17 | Payer: 59 | Attending: Nephrology | Primary: Nephrology

## 2017-08-17 ENCOUNTER — Encounter: Payer: Self-pay | Admitting: Family

## 2017-08-17 ENCOUNTER — Other Ambulatory Visit: Payer: Self-pay

## 2017-08-17 ENCOUNTER — Ambulatory Visit: Payer: Medicare Other | Attending: Family | Admitting: Family

## 2017-08-17 VITALS — BP 180/89 | HR 74 | Resp 18 | Ht 63.0 in | Wt 185.1 lb

## 2017-08-17 DIAGNOSIS — Z992 Dependence on renal dialysis: Secondary | ICD-10-CM | POA: Diagnosis not present

## 2017-08-17 DIAGNOSIS — Z885 Allergy status to narcotic agent status: Secondary | ICD-10-CM | POA: Diagnosis not present

## 2017-08-17 DIAGNOSIS — Z7982 Long term (current) use of aspirin: Secondary | ICD-10-CM | POA: Diagnosis not present

## 2017-08-17 DIAGNOSIS — Z87891 Personal history of nicotine dependence: Secondary | ICD-10-CM | POA: Insufficient documentation

## 2017-08-17 DIAGNOSIS — N186 End stage renal disease: Secondary | ICD-10-CM | POA: Diagnosis not present

## 2017-08-17 DIAGNOSIS — I5032 Chronic diastolic (congestive) heart failure: Secondary | ICD-10-CM | POA: Insufficient documentation

## 2017-08-17 DIAGNOSIS — I890 Lymphedema, not elsewhere classified: Secondary | ICD-10-CM | POA: Insufficient documentation

## 2017-08-17 DIAGNOSIS — E1122 Type 2 diabetes mellitus with diabetic chronic kidney disease: Secondary | ICD-10-CM | POA: Diagnosis not present

## 2017-08-17 DIAGNOSIS — E1140 Type 2 diabetes mellitus with diabetic neuropathy, unspecified: Secondary | ICD-10-CM | POA: Insufficient documentation

## 2017-08-17 DIAGNOSIS — Q613 Polycystic kidney, unspecified: Secondary | ICD-10-CM | POA: Diagnosis not present

## 2017-08-17 DIAGNOSIS — I10 Essential (primary) hypertension: Secondary | ICD-10-CM

## 2017-08-17 DIAGNOSIS — J449 Chronic obstructive pulmonary disease, unspecified: Secondary | ICD-10-CM | POA: Diagnosis not present

## 2017-08-17 DIAGNOSIS — R0602 Shortness of breath: Secondary | ICD-10-CM | POA: Diagnosis present

## 2017-08-17 DIAGNOSIS — Z79899 Other long term (current) drug therapy: Secondary | ICD-10-CM | POA: Diagnosis not present

## 2017-08-17 DIAGNOSIS — I132 Hypertensive heart and chronic kidney disease with heart failure and with stage 5 chronic kidney disease, or end stage renal disease: Secondary | ICD-10-CM | POA: Diagnosis not present

## 2017-08-17 NOTE — Patient Instructions (Signed)
Continue weighing daily and call for an overnight weight gain of > 2 pounds or a weekly weight gain of >5 pounds. 

## 2017-08-20 DIAGNOSIS — Z01818 Encounter for other preprocedural examination: Secondary | ICD-10-CM

## 2017-08-20 DIAGNOSIS — Z7682 Awaiting organ transplant status: Principal | ICD-10-CM

## 2017-08-20 DIAGNOSIS — N186 End stage renal disease: Secondary | ICD-10-CM

## 2017-08-25 ENCOUNTER — Encounter: Admit: 2017-08-25 | Discharge: 2017-08-26 | Payer: 59

## 2017-08-25 DIAGNOSIS — G894 Chronic pain syndrome: Principal | ICD-10-CM

## 2017-08-25 DIAGNOSIS — M792 Neuralgia and neuritis, unspecified: Secondary | ICD-10-CM

## 2017-08-25 MED ORDER — OXYCODONE-ACETAMINOPHEN 10 MG-325 MG TABLET
ORAL_TABLET | Freq: Every day | ORAL | 0 refills | 0.00000 days | Status: CP | PRN
Start: 2017-08-25 — End: 2017-08-25

## 2017-08-25 MED ORDER — OXYCODONE-ACETAMINOPHEN 10 MG-325 MG TABLET: 1 | tablet | Freq: Every day | 0 refills | 0 days | Status: AC

## 2017-09-06 ENCOUNTER — Inpatient Hospital Stay
Admission: EM | Admit: 2017-09-06 | Discharge: 2017-09-07 | DRG: 291 | Disposition: A | Discharge status: Home Health | Payer: Medicare Other | Attending: Internal Medicine | Admitting: Internal Medicine

## 2017-09-06 ENCOUNTER — Emergency Department: Payer: Medicare Other

## 2017-09-06 ENCOUNTER — Encounter: Payer: Self-pay | Admitting: Emergency Medicine

## 2017-09-06 DIAGNOSIS — E1142 Type 2 diabetes mellitus with diabetic polyneuropathy: Secondary | ICD-10-CM | POA: Diagnosis present

## 2017-09-06 DIAGNOSIS — J40 Bronchitis, not specified as acute or chronic: Secondary | ICD-10-CM

## 2017-09-06 DIAGNOSIS — D631 Anemia in chronic kidney disease: Secondary | ICD-10-CM | POA: Diagnosis not present

## 2017-09-06 DIAGNOSIS — R7989 Other specified abnormal findings of blood chemistry: Secondary | ICD-10-CM

## 2017-09-06 DIAGNOSIS — R778 Other specified abnormalities of plasma proteins: Secondary | ICD-10-CM

## 2017-09-06 DIAGNOSIS — Z9981 Dependence on supplemental oxygen: Secondary | ICD-10-CM | POA: Diagnosis not present

## 2017-09-06 DIAGNOSIS — R748 Abnormal levels of other serum enzymes: Secondary | ICD-10-CM | POA: Diagnosis not present

## 2017-09-06 DIAGNOSIS — Z87891 Personal history of nicotine dependence: Secondary | ICD-10-CM | POA: Diagnosis not present

## 2017-09-06 DIAGNOSIS — I10 Essential (primary) hypertension: Secondary | ICD-10-CM | POA: Diagnosis present

## 2017-09-06 DIAGNOSIS — Q613 Polycystic kidney, unspecified: Secondary | ICD-10-CM

## 2017-09-06 DIAGNOSIS — E669 Obesity, unspecified: Secondary | ICD-10-CM | POA: Diagnosis not present

## 2017-09-06 DIAGNOSIS — Z6832 Body mass index (BMI) 32.0-32.9, adult: Secondary | ICD-10-CM | POA: Diagnosis not present

## 2017-09-06 DIAGNOSIS — E1122 Type 2 diabetes mellitus with diabetic chronic kidney disease: Secondary | ICD-10-CM | POA: Diagnosis not present

## 2017-09-06 DIAGNOSIS — I5033 Acute on chronic diastolic (congestive) heart failure: Secondary | ICD-10-CM

## 2017-09-06 DIAGNOSIS — I132 Hypertensive heart and chronic kidney disease with heart failure and with stage 5 chronic kidney disease, or end stage renal disease: Secondary | ICD-10-CM | POA: Diagnosis not present

## 2017-09-06 DIAGNOSIS — Z8249 Family history of ischemic heart disease and other diseases of the circulatory system: Secondary | ICD-10-CM | POA: Diagnosis not present

## 2017-09-06 DIAGNOSIS — J449 Chronic obstructive pulmonary disease, unspecified: Secondary | ICD-10-CM | POA: Diagnosis present

## 2017-09-06 DIAGNOSIS — Z992 Dependence on renal dialysis: Secondary | ICD-10-CM | POA: Diagnosis not present

## 2017-09-06 DIAGNOSIS — J9621 Acute and chronic respiratory failure with hypoxia: Secondary | ICD-10-CM | POA: Diagnosis not present

## 2017-09-06 DIAGNOSIS — E875 Hyperkalemia: Secondary | ICD-10-CM | POA: Diagnosis not present

## 2017-09-06 DIAGNOSIS — J441 Chronic obstructive pulmonary disease with (acute) exacerbation: Secondary | ICD-10-CM | POA: Diagnosis not present

## 2017-09-06 DIAGNOSIS — Z7982 Long term (current) use of aspirin: Secondary | ICD-10-CM

## 2017-09-06 DIAGNOSIS — N2581 Secondary hyperparathyroidism of renal origin: Secondary | ICD-10-CM | POA: Diagnosis not present

## 2017-09-06 DIAGNOSIS — Z9111 Patient's noncompliance with dietary regimen: Secondary | ICD-10-CM

## 2017-09-06 DIAGNOSIS — N186 End stage renal disease: Secondary | ICD-10-CM | POA: Diagnosis present

## 2017-09-06 DIAGNOSIS — K219 Gastro-esophageal reflux disease without esophagitis: Secondary | ICD-10-CM | POA: Diagnosis not present

## 2017-09-06 DIAGNOSIS — E119 Type 2 diabetes mellitus without complications: Secondary | ICD-10-CM

## 2017-09-06 LAB — BASIC METABOLIC PANEL
ANION GAP: 15 (ref 5–15)
BUN: 51 mg/dL — ABNORMAL HIGH (ref 6–20)
CO2: 24 mmol/L (ref 22–32)
Calcium: 9.5 mg/dL (ref 8.9–10.3)
Chloride: 100 mmol/L — ABNORMAL LOW (ref 101–111)
Creatinine, Ser: 11.55 mg/dL — ABNORMAL HIGH (ref 0.44–1.00)
GFR calc Af Amer: 4 mL/min — ABNORMAL LOW (ref >60)
GFR, EST NON AFRICAN AMERICAN: 3 mL/min — AB (ref >60)
GLUCOSE: 81 mg/dL (ref 65–99)
Potassium: 4.5 mmol/L (ref 3.5–5.1)
Sodium: 139 mmol/L (ref 135–145)

## 2017-09-06 LAB — CBC
HEMATOCRIT: 26.8 % — AB (ref 35.0–47.0)
Hemoglobin: 8.5 g/dL — ABNORMAL LOW (ref 12.0–16.0)
MCH: 27.7 pg (ref 26.0–34.0)
MCHC: 31.8 g/dL — AB (ref 32.0–36.0)
MCV: 86.9 fL (ref 80.0–100.0)
Platelets: 344 10*3/uL (ref 150–440)
RBC: 3.08 MIL/uL — AB (ref 3.80–5.20)
RDW: 21 % — ABNORMAL HIGH (ref 11.5–14.5)
WBC: 10.2 10*3/uL (ref 3.6–11.0)

## 2017-09-06 LAB — INFLUENZA PANEL BY PCR (TYPE A & B)
INFLBPCR: NEGATIVE
Influenza A By PCR: NEGATIVE

## 2017-09-06 LAB — TROPONIN I
Troponin I: 0.07 ng/mL (ref <0.03)
Troponin I: 0.08 ng/mL (ref <0.03)

## 2017-09-06 LAB — BRAIN NATRIURETIC PEPTIDE: B NATRIURETIC PEPTIDE 5: 1148 pg/mL — AB (ref 0.0–100.0)

## 2017-09-06 MED ORDER — FUROSEMIDE 10 MG/ML IJ SOLN: 40.0000 mg | Freq: Once | INTRAMUSCULAR | Status: DC

## 2017-09-06 MED ORDER — FUROSEMIDE 40 MG PO TABS
40.0000 mg | ORAL_TABLET | Freq: Once | ORAL | Status: AC
  Administered 2017-09-06: 40 mg via ORAL
  Filled 2017-09-06: qty 1

## 2017-09-06 MED ORDER — METHYLPREDNISOLONE SODIUM SUCC 125 MG IJ SOLR
125.0000 mg | Freq: Once | INTRAMUSCULAR | Status: AC
  Administered 2017-09-06: 125 mg via INTRAVENOUS
  Filled 2017-09-06: qty 2

## 2017-09-06 MED ORDER — OXYCODONE-ACETAMINOPHEN 5-325 MG PO TABS
1.0000 | ORAL_TABLET | Freq: Once | ORAL | Status: AC
  Administered 2017-09-06: 1 via ORAL
  Filled 2017-09-06: qty 1

## 2017-09-06 MED ORDER — ALBUTEROL SULFATE (2.5 MG/3ML) 0.083% IN NEBU
5.0000 mg | INHALATION_SOLUTION | Freq: Once | RESPIRATORY_TRACT | Status: AC
  Administered 2017-09-06: 5 mg via RESPIRATORY_TRACT
  Filled 2017-09-06: qty 6

## 2017-09-06 MED ORDER — PREDNISONE 20 MG PO TABS
ORAL_TABLET | ORAL | Status: AC
  Administered 2017-09-06: 20 mg
  Filled 2017-09-06: qty 3

## 2017-09-06 NOTE — ED Notes (Signed)
Charge contacted to call lab for phlebotomist to draw blood on patient after multiple unsuccessful attempts

## 2017-09-06 NOTE — ED Provider Notes (Signed)
[facility] Emergency Department Provider Note  ____________________________________________   I have reviewed the triage vital signs and the nursing notes. Where available I have reviewed prior notes and, if possible and indicated, outside hospital notes.    HISTORY  Chief Complaint Shortness of Breath    HPI Jasmine Holloway is a 57 y.o. female story of CHF COPD diabetes, end-stage renal, COPD, does not make much urine, has history of Thursday and Saturday dialysis, is up-to-date on her dialysis, presents today complaining of runny nose, cough, pain with cough, low-grade fever at home.  She has been feeling sick for the last 2-3 days.  Patient does have a chronically elevated troponin.  She is compliant with her medications.  She states that she is on 2-4 L at baseline has not had to significantly increase her oxygen but has had to go up to 4 L.  Is denying chest pain although sometimes it is uncomfortable to cough.  Is not usually productive although sometimes it is.  Positive sick contact very large community burden of similar.   Past Medical History:  Diagnosis Date  . Asthma   . CHF (congestive heart failure) ([place])   . COPD (chronic obstructive pulmonary disease) ([place])   . Diabetes mellitus without complication ([place])   . Diastolic heart failure ([place])   . ESRD (end stage renal disease) on dialysis ([place])   . Hypertension   . Neuropathy   . Polycystic kidney disease   . Renal insufficiency     Patient Active Problem List   Diagnosis Date Noted  . Acute CHF (congestive heart failure) ([place]) 06/15/2017  . Lymphedema 05/11/2017  . Diabetes ([place]) 02/10/2017  . Esophageal stricture   . Hypoxia 11/10/2016  . Shortness of breath 10/26/2016  . COPD (chronic obstructive pulmonary disease) ([place]) 08/06/2016  . Hypertensive crisis 08/06/2016  . Atypical chest pain 06/08/2016  . Chronic pain syndrome 06/08/2016  . Anemia 06/08/2016  . Epigastric pain 03/28/2016   . Chronic diastolic heart failure ([place]) 02/04/2016  . Hoarseness of voice 02/04/2016  . Nicotine dependence, uncomplicated 31/54/0086  . ESRF (end stage renal failure) ([place]) 12/22/2015  . Essential hypertension 12/22/2015  . Mixed hyperlipidemia 12/22/2015  . Knee pain, left anterior 06/11/2015  . Arm pain, left 05/31/2015  . Pain of left arm 05/31/2015  . Closed bilateral ankle fractures, with routine healing, subsequent encounter 07/24/2014  . Closed bilateral ankle fractures, initial encounter 06/20/2014    Past Surgical History:  Procedure Laterality Date  . ARTERIOVENOUS GRAFT PLACEMENT Right    x3 (R forearm currently used for access)  . BREAST BIOPSY Left 12/03/2015   neg  . COLON SURGERY    . ESOPHAGOGASTRODUODENOSCOPY (EGD) WITH PROPOFOL N/A 02/04/2017   Procedure: ESOPHAGOGASTRODUODENOSCOPY (EGD) WITH PROPOFOL;  Surgeon: Jonathon Bellows, MD;  Location[facility] ENDOSCOPY;  Service: Endoscopy;  Laterality: N/A;  . PERIPHERAL VASCULAR CATHETERIZATION Right 08/04/2016   Procedure: A/V Shuntogram;  Surgeon: Algernon Huxley, MD;  Location: [facility] CV LAB;  Service: Cardiovascular;  Laterality: Right;  . VEIN HARVEST      Prior to Admission medications   Medication Sig Start Date End Date Taking? Authorizing Provider  acetaminophen (TYLENOL) 325 MG tablet Take 2 tablets (650 mg total) by mouth every 6 (six) hours as needed for mild pain or headache. Patient taking differently: Take 325 mg every 6 (six) hours as needed by mouth for mild pain or headache.  03/18/16   Nicholes Mango, MD  amitriptyline (ELAVIL) 10 MG tablet  Take 10 mg by mouth at bedtime as needed for sleep.    [provider]  amLODipine (NORVASC) 5 MG tablet Take 5 mg daily by mouth.  08/31/14   [provider]  aspirin 81 MG chewable tablet Chew 1 tablet (81 mg total) by mouth daily. 01/17/16   Gladstone Lighter, MD  b complex-vitamin c-folic acid (NEPHRO-VITE) 0.8 MG TABS tablet Take 1 tablet by mouth at  bedtime.    [provider]  calcium acetate (PHOSLO) 667 MG capsule Take 1 capsule (667 mg total) by mouth 3 (three) times daily with meals. Patient taking differently: Take 1,334 mg by mouth 3 (three) times daily with meals.  02/02/17   Loletha Grayer, MD  cloNIDine (CATAPRES) 0.1 MG tablet Take 1 tablet (0.1 mg total) by mouth 2 (two) times daily. 01/17/16   [place] Lighter, MD  diphenhydrAMINE (BENADRYL) 25 mg capsule Take 2 capsules (50 mg total) by mouth every 6 (six) hours as needed. 10/16/16   Carrie Mew, MD  furosemide (LASIX) 80 MG tablet Take 1 tablet (80 mg total) by mouth daily. 03/05/16 11/10/17  Alisa Graff, FNP  gabapentin (NEURONTIN) 300 MG capsule Take 300 mg 2 (two) times daily by mouth.     [provider]  hydrALAZINE (APRESOLINE) 50 MG tablet Take 1 tablet (50 mg total) by mouth 3 (three) times daily. 02/02/17   Loletha Grayer, MD  lidocaine (XYLOCAINE) 5 % ointment Apply topically.    [provider]  losartan (COZAAR) 50 MG tablet Take 1 tablet (50 mg total) daily by mouth. 06/24/17 09/22/17  Alisa Graff, FNP  metoCLOPramide (REGLAN) 5 MG tablet Take 1 tablet (5 mg total) by mouth 2 (two) times daily as needed (nausea, vomiting or headache). 08/05/17 08/05/18  Earleen Newport, MD  metoprolol succinate (TOPROL-XL) 50 MG 24 hr tablet Take 50 mg daily by mouth.     [provider]  nitroGLYCERIN (NITROSTAT) 0.4 MG SL tablet Place 1 tablet (0.4 mg total) under the tongue every 5 (five) minutes as needed for chest pain. 01/14/17 08/17/17  Minna Merritts, MD  omeprazole (PRILOSEC) 20 MG capsule Take 1 capsule by mouth daily. 08/06/17   [provider]  ondansetron (ZOFRAN) 4 MG tablet Take 1 tablet (4 mg total) by mouth every 8 (eight) hours as needed for nausea or vomiting. 07/21/16   Daymon Larsen, MD  oxyCODONE-acetaminophen (PERCOCET) 10-325 MG tablet Take 1 tablet by mouth daily as needed for pain.    [provider]  prochlorperazine (COMPAZINE) 10 MG tablet Take 1 tablet (10 mg total) by mouth every 6 (six) hours as needed for nausea. 10/15/16   Paulette Blanch, MD  SENSIPAR 30 MG tablet Take 1 tablet (30 mg total) by mouth daily. 07/07/16   Fritzi Mandes, MD  VENTOLIN HFA 108 (90 Base) MCG/ACT inhaler Inhale 2 puffs into the lungs daily as needed.  05/21/16   [provider]    Allergies Morphine and related; Strawberry extract; Tramadol; Venofer [iron sucrose]; Vicodin [hydrocodone-acetaminophen]; Buprenorphine hcl; Codeine; and Morphine  Family History  Problem Relation Age of Onset  . Hypertension Brother   . Heart failure Brother   . Hypertension Mother   . Heart disease Mother     Social History Social History   Tobacco Use  . Smoking status: Former Smoker    Packs/day: 0.25    Last attempt to quit: 08/11/2015    Years since quitting: 2.0  . Smokeless tobacco: Never  Used  Substance Use Topics  . Alcohol use: No    Alcohol/week: 0.0 oz  . Drug use: No    Review of Systems Constitutional: No fever/chills Eyes: No visual changes. ENT: No sore throat. No stiff neck no neck pain Cardiovascular: Denies chest pain or occasionally while coughing it is uncomfortable. Respiratory: + shortness of breath. Gastrointestinal:   no vomiting.  No diarrhea.  No constipation. Genitourinary: Negative for dysuria. Musculoskeletal: Negative lower extremity swelling Skin: Negative for rash. Neurological: Negative for severe headaches, focal weakness or numbness.   ____________________________________________   PHYSICAL EXAM:  VITAL SIGNS: ED Triage Vitals  Enc Vitals Group     BP 09/06/17 1639 (!) 171/96     Pulse Rate 09/06/17 1639 98     Resp 09/06/17 1639 (!) 22     Temp --      Temp src --      SpO2 09/06/17 1639 100 %     Weight 09/06/17 1641 180 lb (81.6 kg)     Height 09/06/17 1641 5\' 3"  (1.6 m)     Head Circumference --      Peak Flow --      Pain Score  09/06/17 1641 7     Pain Loc --      Pain Edu? --      Excl. in [place]? --     Constitutional: Alert and oriented. Well appearing and in no acute distress. Eyes: Conjunctivae are normal Head: Atraumatic HEENT: +congestion/rhinnorhea. Mucous membranes are moist.  Oropharynx non-erythematous Neck:   Nontender with no meningismus, no masses, no stridor Cardiovascular: Normal rate, regular rhythm. Grossly normal heart sounds.  Good peripheral circulation. Respiratory: Normal respiratory effort.  No retractions.  Occasional wheeze appreciated.. Abdominal: Soft and nontender. No distention. No guarding no rebound Back:  There is no focal tenderness or step off.  there is no midline tenderness there are no lesions noted. there is no CVA tenderness Musculoskeletal: No lower extremity tenderness, no upper extremity tenderness. No joint effusions, no DVT signs strong distal pulses no edema Neurologic:  Normal speech and language. No gross focal neurologic deficits are appreciated.  Skin:  Skin is warm, dry and intact. No rash noted. Psychiatric: Mood and affect are normal. Speech and behavior are normal.  ____________________________________________   LABS (all labs ordered are listed, but only abnormal results are displayed)  Labs Reviewed  BASIC METABOLIC PANEL - Abnormal; Notable for the following components:      Result Value   Chloride 100 (*)    BUN 51 (*)    Creatinine, Ser 11.55 (*)    GFR calc non Af Amer 3 (*)    GFR calc Af Amer 4 (*)    All other components within normal limits  CBC - Abnormal; Notable for the following components:   RBC 3.08 (*)    Hemoglobin 8.5 (*)    HCT 26.8 (*)    MCHC 31.8 (*)    RDW 21.0 (*)    All other components within normal limits  TROPONIN I - Abnormal; Notable for the following components:   Troponin I 0.07 (*)    All other components within normal limits  BRAIN NATRIURETIC PEPTIDE - Abnormal; Notable for the following components:   B  Natriuretic Peptide 1,148.0 (*)    All other components within normal limits  INFLUENZA PANEL BY PCR (TYPE A & B)  TROPONIN I    Pertinent labs  results that were available during my care of the  patient were reviewed by me and considered in my medical decision making (see chart for details). ____________________________________________  EKG  I personally interpreted any EKGs ordered by me or triage Sinus rhythm rate 99 bpm, no acute ST elevation or depression, LAD noted, nonspecific ST changes ____________________________________________  RADIOLOGY  Pertinent labs & imaging results that were available during my care of the patient were reviewed by me and considered in my medical decision making (see chart for details). If possible, patient and/or family made aware of any abnormal findings.  Dg Chest 2 View  Result Date: 09/06/2017 CLINICAL DATA:  Worsening cough and shortness of breath since being at an outdoor event on Saturday. History of congestive heart failure, COPD, hypertension. EXAM: CHEST  2 VIEW COMPARISON:  07/21/2017 FINDINGS: The heart is enlarged. There are increased opacities throughout the lungs bilaterally, with reticulonodular appearance. No discrete consolidations. No pleural effusions. Surgical clips in the left axilla. IMPRESSION: Cardiomegaly and increased pulmonary opacities. Findings are most likely related to pulmonary edema. However, close follow-up is recommended given the appearance of nodular opacities to ensure resolution. Electronically Signed   By: Nolon Nations M.D.   On: 09/06/2017 17:17   ____________________________________________    PROCEDURES  Procedure(s) performed: None  Procedures  Critical Care performed: None  ____________________________________________   INITIAL IMPRESSION / ASSESSMENT AND PLAN / ED COURSE  Pertinent labs & imaging results that were available during my care of the patient were reviewed by me and considered in my  medical decision making (see chart for details).  Patient here with cough wheeze rhinorrhea, we will give her albuterol, prednisone for COPD, chest x-ray as well as possibly mild pulmonary edema but no evidence of infiltrate, patient is scheduled for dialysis tomorrow morning, blood pressure is elevated but that is again not unusual the day before dialysis, troponin is mildly elevated but again patient is chronically elevated troponin we will recheck it to make sure it is not trending up EKG does not show any acute ischemic changes.  Really she is here for cough and is.  Is my hope that we can give her prednisone, albuterol, recheck troponin and reassess.  She does occasionally make urine, we may give her Lasix as well to see if we can diurese her a little bit.  She is nontoxic in appearance, and her vital signs are reassuring is my hope that we can get her safely home tonight which is her preference    ____________________________________________   FINAL CLINICAL IMPRESSION(S) / ED DIAGNOSES  Final diagnoses:  None      This chart was dictated using voice recognition software.  Despite best efforts to proofread,  errors can occur which can change meaning.      Schuyler Amor, MD 09/06/17 2232

## 2017-09-06 NOTE — ED Notes (Addendum)
Pt to room 18 via w/c with no distress noted; pt stood to bed without difficulty; placed in hosp gown & on card monitor for further evaluation; O2 in place at 2l/min via [place] per home use; report to care nurse given

## 2017-09-06 NOTE — ED Notes (Addendum)
Pt bumped up to 3L due to O2 staying at 88-89%. MD aware

## 2017-09-06 NOTE — ED Notes (Signed)
Lab called to a high troponin of 0.08 Dr. Burlene Arnt was notified.

## 2017-09-06 NOTE — ED Triage Notes (Signed)
Pt in via [facility] from home with complaints of worsening cough and shortness of breath since being at an outdoor event Saturday.  Pt on 2-4L nasal cannula at baseline, on 4L upon arrival.  Pt is on dialysis, reports Tue, Thur Sat treatments, denies missing any treatments.

## 2017-09-06 NOTE — ED Notes (Signed)
Charge nurse notified of pt's labs 

## 2017-09-06 NOTE — ED Notes (Signed)
Pt is eating at the Room.

## 2017-09-06 NOTE — ED Notes (Signed)
Pt given meal tray.

## 2017-09-07 ENCOUNTER — Other Ambulatory Visit: Payer: Self-pay

## 2017-09-07 ENCOUNTER — Encounter: Payer: Self-pay | Admitting: Emergency Medicine

## 2017-09-07 DIAGNOSIS — R748 Abnormal levels of other serum enzymes: Secondary | ICD-10-CM | POA: Diagnosis not present

## 2017-09-07 DIAGNOSIS — I5033 Acute on chronic diastolic (congestive) heart failure: Secondary | ICD-10-CM | POA: Diagnosis not present

## 2017-09-07 DIAGNOSIS — I10 Essential (primary) hypertension: Secondary | ICD-10-CM | POA: Diagnosis not present

## 2017-09-07 DIAGNOSIS — Z992 Dependence on renal dialysis: Secondary | ICD-10-CM | POA: Diagnosis not present

## 2017-09-07 DIAGNOSIS — Z7982 Long term (current) use of aspirin: Secondary | ICD-10-CM | POA: Diagnosis not present

## 2017-09-07 DIAGNOSIS — I132 Hypertensive heart and chronic kidney disease with heart failure and with stage 5 chronic kidney disease, or end stage renal disease: Secondary | ICD-10-CM | POA: Diagnosis not present

## 2017-09-07 DIAGNOSIS — D631 Anemia in chronic kidney disease: Secondary | ICD-10-CM

## 2017-09-07 DIAGNOSIS — Q613 Polycystic kidney, unspecified: Secondary | ICD-10-CM | POA: Diagnosis not present

## 2017-09-07 DIAGNOSIS — N186 End stage renal disease: Secondary | ICD-10-CM

## 2017-09-07 DIAGNOSIS — I248 Other forms of acute ischemic heart disease: Secondary | ICD-10-CM

## 2017-09-07 DIAGNOSIS — N2581 Secondary hyperparathyroidism of renal origin: Secondary | ICD-10-CM | POA: Diagnosis not present

## 2017-09-07 DIAGNOSIS — Z87891 Personal history of nicotine dependence: Secondary | ICD-10-CM | POA: Diagnosis not present

## 2017-09-07 DIAGNOSIS — E669 Obesity, unspecified: Secondary | ICD-10-CM | POA: Diagnosis not present

## 2017-09-07 DIAGNOSIS — K219 Gastro-esophageal reflux disease without esophagitis: Secondary | ICD-10-CM | POA: Diagnosis not present

## 2017-09-07 DIAGNOSIS — J441 Chronic obstructive pulmonary disease with (acute) exacerbation: Secondary | ICD-10-CM | POA: Diagnosis not present

## 2017-09-07 DIAGNOSIS — Z6832 Body mass index (BMI) 32.0-32.9, adult: Secondary | ICD-10-CM | POA: Diagnosis not present

## 2017-09-07 DIAGNOSIS — E1142 Type 2 diabetes mellitus with diabetic polyneuropathy: Secondary | ICD-10-CM | POA: Diagnosis not present

## 2017-09-07 DIAGNOSIS — E875 Hyperkalemia: Secondary | ICD-10-CM | POA: Diagnosis not present

## 2017-09-07 DIAGNOSIS — E1122 Type 2 diabetes mellitus with diabetic chronic kidney disease: Secondary | ICD-10-CM | POA: Diagnosis not present

## 2017-09-07 DIAGNOSIS — J9621 Acute and chronic respiratory failure with hypoxia: Secondary | ICD-10-CM | POA: Diagnosis not present

## 2017-09-07 DIAGNOSIS — Z8249 Family history of ischemic heart disease and other diseases of the circulatory system: Secondary | ICD-10-CM | POA: Diagnosis not present

## 2017-09-07 DIAGNOSIS — Z9111 Patient's noncompliance with dietary regimen: Secondary | ICD-10-CM | POA: Diagnosis not present

## 2017-09-07 DIAGNOSIS — Z9981 Dependence on supplemental oxygen: Secondary | ICD-10-CM | POA: Diagnosis not present

## 2017-09-07 DIAGNOSIS — J40 Bronchitis, not specified as acute or chronic: Secondary | ICD-10-CM | POA: Diagnosis present

## 2017-09-07 LAB — BASIC METABOLIC PANEL
Anion gap: 13 (ref 5–15)
BUN: 64 mg/dL — AB (ref 6–20)
CALCIUM: 9 mg/dL (ref 8.9–10.3)
CO2: 25 mmol/L (ref 22–32)
Chloride: 99 mmol/L — ABNORMAL LOW (ref 101–111)
Creatinine, Ser: 12.82 mg/dL — ABNORMAL HIGH (ref 0.44–1.00)
GFR calc Af Amer: 3 mL/min — ABNORMAL LOW (ref >60)
GFR, EST NON AFRICAN AMERICAN: 3 mL/min — AB (ref >60)
GLUCOSE: 240 mg/dL — AB (ref 65–99)
Potassium: 5.3 mmol/L — ABNORMAL HIGH (ref 3.5–5.1)
Sodium: 137 mmol/L (ref 135–145)

## 2017-09-07 LAB — GLUCOSE, CAPILLARY
GLUCOSE-CAPILLARY: 188 mg/dL — AB (ref 65–99)
Glucose-Capillary: 123 mg/dL — ABNORMAL HIGH (ref 65–99)

## 2017-09-07 LAB — CBC
HEMATOCRIT: 25.7 % — AB (ref 35.0–47.0)
HEMOGLOBIN: 8 g/dL — AB (ref 12.0–16.0)
MCH: 27 pg (ref 26.0–34.0)
MCHC: 31.1 g/dL — ABNORMAL LOW (ref 32.0–36.0)
MCV: 86.8 fL (ref 80.0–100.0)
Platelets: 377 10*3/uL (ref 150–440)
RBC: 2.96 MIL/uL — ABNORMAL LOW (ref 3.80–5.20)
RDW: 21.5 % — AB (ref 11.5–14.5)
WBC: 10.5 10*3/uL (ref 3.6–11.0)

## 2017-09-07 LAB — TROPONIN I
TROPONIN I: 0.05 ng/mL — AB (ref <0.03)
TROPONIN I: 0.05 ng/mL — AB (ref <0.03)

## 2017-09-07 LAB — MRSA PCR SCREENING: MRSA BY PCR: NEGATIVE

## 2017-09-07 MED ORDER — LOSARTAN POTASSIUM 50 MG PO TABS: 50.0000 mg | ORAL_TABLET | Freq: Every day | ORAL | Status: DC

## 2017-09-07 MED ORDER — HYDRALAZINE HCL 100 MG PO TABS: 100.0000 mg | ORAL_TABLET | Freq: Three times a day (TID) | ORAL | 0 refills | Status: DC

## 2017-09-07 MED ORDER — ORAL CARE MOUTH RINSE: 15.0000 mL | Freq: Two times a day (BID) | OROMUCOSAL | Status: DC

## 2017-09-07 MED ORDER — INSULIN ASPART 100 UNIT/ML ~~LOC~~ SOLN: 0.0000 [IU] | Freq: Every day | SUBCUTANEOUS | Status: DC

## 2017-09-07 MED ORDER — PANTOPRAZOLE SODIUM 40 MG PO TBEC
40.0000 mg | DELAYED_RELEASE_TABLET | Freq: Every day | ORAL | Status: DC
  Administered 2017-09-07: 40 mg via ORAL
  Filled 2017-09-07: qty 1

## 2017-09-07 MED ORDER — ONDANSETRON HCL 4 MG PO TABS: 4.0000 mg | ORAL_TABLET | Freq: Four times a day (QID) | ORAL | Status: DC | PRN

## 2017-09-07 MED ORDER — FUROSEMIDE 40 MG PO TABS
80.0000 mg | ORAL_TABLET | Freq: Every day | ORAL | Status: DC
  Administered 2017-09-07: 80 mg via ORAL
  Filled 2017-09-07: qty 2

## 2017-09-07 MED ORDER — OXYCODONE-ACETAMINOPHEN 10-325 MG PO TABS: 1.0000 | ORAL_TABLET | Freq: Four times a day (QID) | ORAL | Status: DC | PRN

## 2017-09-07 MED ORDER — ONDANSETRON HCL 4 MG/2ML IJ SOLN: 4.0000 mg | Freq: Four times a day (QID) | INTRAMUSCULAR | Status: DC | PRN

## 2017-09-07 MED ORDER — METOPROLOL SUCCINATE ER 50 MG PO TB24
50.0000 mg | ORAL_TABLET | Freq: Every day | ORAL | Status: DC
  Administered 2017-09-07: 50 mg via ORAL
  Filled 2017-09-07: qty 1

## 2017-09-07 MED ORDER — LOSARTAN POTASSIUM 100 MG PO TABS: 100.0000 mg | ORAL_TABLET | Freq: Every day | ORAL | 0 refills | Status: DC

## 2017-09-07 MED ORDER — GABAPENTIN 300 MG PO CAPS
300.0000 mg | ORAL_CAPSULE | Freq: Two times a day (BID) | ORAL | Status: DC
  Administered 2017-09-07: 300 mg via ORAL
  Filled 2017-09-07: qty 1

## 2017-09-07 MED ORDER — ACETAMINOPHEN 325 MG PO TABS: 650.0000 mg | ORAL_TABLET | Freq: Four times a day (QID) | ORAL | Status: DC | PRN

## 2017-09-07 MED ORDER — OXYCODONE HCL 5 MG PO TABS: 5.0000 mg | ORAL_TABLET | Freq: Four times a day (QID) | ORAL | Status: DC | PRN

## 2017-09-07 MED ORDER — HYDRALAZINE HCL 50 MG PO TABS: 100.0000 mg | ORAL_TABLET | Freq: Three times a day (TID) | ORAL | Status: DC

## 2017-09-07 MED ORDER — HEPARIN SODIUM (PORCINE) 5000 UNIT/ML IJ SOLN
5000.0000 [IU] | Freq: Three times a day (TID) | INTRAMUSCULAR | Status: DC
  Administered 2017-09-07 (×2): 5000 [IU] via SUBCUTANEOUS
  Filled 2017-09-07 (×2): qty 1

## 2017-09-07 MED ORDER — INSULIN ASPART 100 UNIT/ML ~~LOC~~ SOLN
0.0000 [IU] | Freq: Three times a day (TID) | SUBCUTANEOUS | Status: DC
  Administered 2017-09-07: 1 [IU] via SUBCUTANEOUS
  Administered 2017-09-07: 2 [IU] via SUBCUTANEOUS
  Filled 2017-09-07 (×2): qty 1

## 2017-09-07 MED ORDER — AMLODIPINE BESYLATE 5 MG PO TABS
5.0000 mg | ORAL_TABLET | Freq: Every day | ORAL | Status: DC
  Administered 2017-09-07: 5 mg via ORAL
  Filled 2017-09-07: qty 1

## 2017-09-07 MED ORDER — EPOETIN ALFA 10000 UNIT/ML IJ SOLN
10000.0000 [IU] | INTRAMUSCULAR | Status: DC
  Administered 2017-09-07: 10000 [IU] via INTRAVENOUS

## 2017-09-07 MED ORDER — CINACALCET HCL 30 MG PO TABS
30.0000 mg | ORAL_TABLET | Freq: Every day | ORAL | Status: DC
  Filled 2017-09-07: qty 1

## 2017-09-07 MED ORDER — LABETALOL HCL 5 MG/ML IV SOLN
10.0000 mg | INTRAVENOUS | Status: DC | PRN
  Administered 2017-09-07: 10 mg via INTRAVENOUS
  Filled 2017-09-07: qty 4

## 2017-09-07 MED ORDER — ACETAMINOPHEN 650 MG RE SUPP: 650.0000 mg | Freq: Four times a day (QID) | RECTAL | Status: DC | PRN

## 2017-09-07 MED ORDER — CLONIDINE HCL 0.1 MG PO TABS
0.1000 mg | ORAL_TABLET | Freq: Two times a day (BID) | ORAL | Status: DC
  Administered 2017-09-07: 0.1 mg via ORAL
  Filled 2017-09-07: qty 1

## 2017-09-07 MED ORDER — HYDRALAZINE HCL 50 MG PO TABS: 50.0000 mg | ORAL_TABLET | Freq: Three times a day (TID) | ORAL | Status: DC

## 2017-09-07 MED ORDER — OXYCODONE-ACETAMINOPHEN 5-325 MG PO TABS: 1.0000 | ORAL_TABLET | Freq: Four times a day (QID) | ORAL | Status: DC | PRN

## 2017-09-07 MED ORDER — LOSARTAN POTASSIUM 50 MG PO TABS
100.0000 mg | ORAL_TABLET | Freq: Every day | ORAL | Status: DC
  Administered 2017-09-07: 100 mg via ORAL
  Filled 2017-09-07: qty 2

## 2017-09-07 MED ORDER — ASPIRIN 81 MG PO CHEW
81.0000 mg | CHEWABLE_TABLET | Freq: Every day | ORAL | Status: DC
  Administered 2017-09-07: 81 mg via ORAL
  Filled 2017-09-07: qty 1

## 2017-09-07 NOTE — Progress Notes (Signed)
   09/07/17 1040  Clinical Encounter Type  Visited With Patient not available  Visit Type Initial;Other (Comment) (advanced directive order requistion)  Referral From Nurse   Chaplain responded to advanced directive order requisite; patient not in room, bed gone.  Chaplain followed up with staff; patient in a procedure.  A chaplain will continue to attempt contact.

## 2017-09-07 NOTE — Progress Notes (Signed)
[facility]  ROUNDING NOTE   Subjective:   [name] admitted to [facility] on 09/06/2017 for Bronchitis [J40] COPD exacerbation ([place]) [J44.1] Troponin I above reference range [R74.8]  Patient seen and examined on hemodialysis. UF goal of 3-4 liters.     HEMODIALYSIS FLOWSHEET:  Blood Flow Rate (mL/min): 400 mL/min Arterial Pressure (mmHg): -210 mmHg Venous Pressure (mmHg): 270 mmHg Transmembrane Pressure (mmHg): 70 mmHg Ultrafiltration Rate (mL/min): 1150 mL/min Dialysate Flow Rate (mL/min): 800 ml/min Conductivity: Machine : 13.9 Conductivity: Machine : 13.9 Dialysis Fluid Bolus: Normal Saline Bolus Amount (mL): 250 mL Dialysate Change: 2K   Objective:  Vital signs in last 24 hours:  Temp:  [97.4 F (36.3 C)-98.3 F (36.8 C)] 98.3 F (36.8 C) (01/29 1439) Pulse Rate:  [66-100] 87 (01/29 1439) Resp:  [15-26] 26 (01/29 1336) BP: (152-199)/(90-117) 171/96 (01/29 1439) SpO2:  [83 %-100 %] 99 % (01/29 1439) Weight:  [81.6 kg (180 lb)-83.1 kg (183 lb 3.2 oz)] 83.1 kg (183 lb 3.2 oz) (01/29 0926)  Weight change:  Filed Weights   09/06/17 1641 09/07/17 0237 09/07/17 0926  Weight: 81.6 kg (180 lb) 82.6 kg (182 lb) 83.1 kg (183 lb 3.2 oz)    Intake/Output: No intake/output data recorded.   Intake/Output this shift:  Total I/O In: -  Out: 3012 [Other:3012]  Physical Exam: General: NAD, sitting up   Head: Normocephalic, atraumatic. Moist oral mucosal membranes  Eyes: Anicteric, PERRL  Neck: Supple, trachea midline  Lungs:  Clear to auscultation  Heart: Regular rate and rhythm  Abdomen:  Soft, nontender,   Extremities:  trace peripheral edema.  Neurologic: Nonfocal, moving all four extremities  Skin: No lesions  Access: Right forearm AVG    Basic Metabolic Panel: Recent Labs  Lab 09/06/17 1638 09/07/17 0419  NA 139 137  K 4.5 5.3*  CL 100* 99*  CO2 24 25  GLUCOSE 81 240*  BUN 51* 64*  CREATININE 11.55* 12.82*  CALCIUM 9.5 9.0     Liver Function Tests: No results for input(s): AST, ALT, ALKPHOS, BILITOT, PROT, ALBUMIN in the last 168 hours. No results for input(s): LIPASE, AMYLASE in the last 168 hours. No results for input(s): AMMONIA in the last 168 hours.  CBC: Recent Labs  Lab 09/06/17 1638 09/07/17 0419  WBC 10.2 10.5  HGB 8.5* 8.0*  HCT 26.8* 25.7*  MCV 86.9 86.8  PLT 344 377    Cardiac Enzymes: Recent Labs  Lab 09/06/17 1638 09/06/17 2206 09/07/17 0419 09/07/17 0935  TROPONINI 0.07* 0.08* 0.05* 0.05*    BNP: Invalid input(s): POCBNP  CBG: Recent Labs  Lab 09/07/17 0737 09/07/17 1447  GLUCAP 188* 123*    Microbiology: Results for orders placed or performed during the hospital encounter of 09/06/17  MRSA PCR Screening     Status: None   Collection Time: 09/07/17  2:40 AM  Result Value Ref Range Status   MRSA by PCR NEGATIVE NEGATIVE Final    Comment:        The GeneXpert MRSA Assay (FDA approved for NASAL specimens only), is one component of a comprehensive MRSA colonization surveillance program. It is not intended to diagnose MRSA infection nor to guide or monitor treatment for MRSA infections. Performed at [facility], [address] [place], [place] [address]     Coagulation Studies: No results for input(s): LABPROT, INR in the last 72 hours.  Urinalysis: No results for input(s): COLORURINE, LABSPEC, PHURINE, GLUCOSEU, HGBUR, BILIRUBINUR, KETONESUR, PROTEINUR, UROBILINOGEN, NITRITE, LEUKOCYTESUR in the  last 72 hours.  Invalid input(s): APPERANCEUR    Imaging: Dg Chest 2 View  Result Date: 09/06/2017 CLINICAL DATA:  Worsening cough and shortness of breath since being at an outdoor event on Saturday. History of congestive heart failure, COPD, hypertension. EXAM: CHEST  2 VIEW COMPARISON:  07/21/2017 FINDINGS: The heart is enlarged. There are increased opacities throughout the lungs bilaterally, with reticulonodular appearance. No discrete  consolidations. No pleural effusions. Surgical clips in the left axilla. IMPRESSION: Cardiomegaly and increased pulmonary opacities. Findings are most likely related to pulmonary edema. However, close follow-up is recommended given the appearance of nodular opacities to ensure resolution. Electronically Signed   By: Nolon Nations M.D.   On: 09/06/2017 17:17     Medications:    . amLODipine  5 mg Oral Daily  . aspirin  81 mg Oral Daily  . cinacalcet  30 mg Oral Q breakfast  . cloNIDine  0.1 mg Oral BID  . epoetin (EPOGEN/PROCRIT) injection  10,000 Units Intravenous Q T,Th,Sa-HD  . furosemide  80 mg Oral Daily  . gabapentin  300 mg Oral BID  . heparin  5,000 Units Subcutaneous Q8H  . hydrALAZINE  100 mg Oral TID  . insulin aspart  0-5 Units Subcutaneous QHS  . insulin aspart  0-9 Units Subcutaneous TID WC  . losartan  100 mg Oral Daily  . mouth rinse  15 mL Mouth Rinse BID  . metoprolol succinate  50 mg Oral Daily  . pantoprazole  40 mg Oral Daily   acetaminophen **OR** acetaminophen, labetalol, ondansetron **OR** ondansetron (ZOFRAN) IV, oxyCODONE-acetaminophen **AND** oxyCODONE  Assessment/ Plan:  [name] is a 57 y.o. black female with end-stage renal disease secondary to polycystic kidney disease on hemodialysis, hypertension, GERD, diastolic congestive heart failure, peripheral neuropathy, COPD/asthma  TTS [facility]. Right AVG. EDW 84kg.   1. End Stage Renal Disease: with hyperkalemia Seen and examined on hemodialysis. Tolerating treatment. UF goal of 4 liters.  Continue TTS schedule.   2. Hypertension: with diastolic congestive heart failure  - Low salt diet, fluid restriction - amlodipine, clonidine, furosemide, hydralazine, losartan  3. Anemia of chronic kidney disease: Hemoglobin 8 - EPO with HD treatment  4. Secondary Hyperparathyroidism: phosphorus at goal.  - Calcium acetate - cinacalcet   LOS: 0 Kaitrin Seybold 1/29/20193:06 PM

## 2017-09-07 NOTE — ED Notes (Signed)
Patient transported to 252

## 2017-09-07 NOTE — Progress Notes (Signed)
Pre hd 

## 2017-09-07 NOTE — Discharge Instructions (Signed)
Heart Failure Clinic appointment on September 17 2017 at 9:20am with [name], [place]. Please call [phone] to reschedule.

## 2017-09-07 NOTE — Progress Notes (Signed)
HD initiated via R loop avg (reversed) with 15g needles x2. No heparin treatment as ordered. uf goal 2L. Patient currently without complaints.

## 2017-09-07 NOTE — H&P (Signed)
[facility] [place] NAME: Jasmine Holloway    MR#:  [id number]  DATE OF BIRTH:  04/07/1961  DATE OF ADMISSION:  09/06/2017  PRIMARY CARE PHYSICIAN: Alene Mires Elyse Jarvis, MD   REQUESTING/REFERRING PHYSICIAN: Burlene Arnt, MD  CHIEF COMPLAINT:   Chief Complaint  Patient presents with  . Shortness of Breath    HISTORY OF PRESENT ILLNESS:  Jasmine Holloway  is a 57 y.o. female who presents with several days of progressive shortness of breath.  Patient states that her blood pressure has been elevated for the past couple of days, and significantly more so today.  She came in when her shortness of breath became much worse tonight.  Here she is found to have pulmonary edema on imaging and very elevated blood pressure.  Hospitalist were called for admission  PAST MEDICAL HISTORY:   Past Medical History:  Diagnosis Date  . Asthma   . CHF (congestive heart failure) ([place])   . COPD (chronic obstructive pulmonary disease) ([place])   . Diabetes mellitus without complication ([place])   . Diastolic heart failure ([place])   . ESRD (end stage renal disease) on dialysis ([place])   . Hypertension   . Neuropathy   . Polycystic kidney disease   . Renal insufficiency     PAST SURGICAL HISTORY:   Past Surgical History:  Procedure Laterality Date  . ARTERIOVENOUS GRAFT PLACEMENT Right    x3 (R forearm currently used for access)  . BREAST BIOPSY Left 12/03/2015   neg  . COLON SURGERY    . ESOPHAGOGASTRODUODENOSCOPY (EGD) WITH PROPOFOL N/A 02/04/2017   Procedure: ESOPHAGOGASTRODUODENOSCOPY (EGD) WITH PROPOFOL;  Surgeon: Jonathon Bellows, MD;  Location: [facility] ENDOSCOPY;  Service: Endoscopy;  Laterality: N/A;  . PERIPHERAL VASCULAR CATHETERIZATION Right 08/04/2016   Procedure: A/V Shuntogram;  Surgeon: Algernon Huxley, MD;  Location: [facility] LAB;  Service: Cardiovascular;  Laterality: Right;  . VEIN HARVEST      SOCIAL HISTORY:   Social History   Tobacco Use  .  Smoking status: Former Smoker    Packs/day: 0.25    Last attempt to quit: 08/11/2015    Years since quitting: 2.0  . Smokeless tobacco: Never Used  Substance Use Topics  . Alcohol use: No    Alcohol/week: 0.0 oz    FAMILY HISTORY:   Family History  Problem Relation Age of Onset  . Hypertension Brother   . Heart failure Brother   . Hypertension Mother   . Heart disease Mother     DRUG ALLERGIES:   Allergies  Allergen Reactions  . Morphine And Related Nausea And Vomiting  . Strawberry Extract   . Tramadol Nausea And Vomiting  . Venofer [Iron Sucrose]   . Vicodin [Hydrocodone-Acetaminophen] Nausea And Vomiting  . Buprenorphine Hcl Nausea And Vomiting  . Codeine Nausea And Vomiting  . Morphine Nausea And Vomiting    Halllucinations    MEDICATIONS AT HOME:   Prior to Admission medications   Medication Sig Start Date End Date Taking? Authorizing Provider  amitriptyline (ELAVIL) 10 MG tablet Take 10 mg by mouth at bedtime as needed for sleep.   Yes [provider]  amLODipine (NORVASC) 5 MG tablet Take 5 mg daily by mouth.  08/31/14  Yes [provider]  aspirin 81 MG chewable tablet Chew 1 tablet (81 mg total) by mouth daily. 01/17/16  Yes Gladstone Lighter, MD  b complex-vitamin c-folic acid (NEPHRO-VITE) 0.8 MG TABS tablet Take 1 tablet by mouth  at bedtime.   Yes [provider]  calcium acetate (PHOSLO) 667 MG capsule Take 1 capsule (667 mg total) by mouth 3 (three) times daily with meals. Patient taking differently: Take 1,334 mg by mouth 3 (three) times daily with meals.  02/02/17  Yes Wieting, Richard, MD  cloNIDine (CATAPRES) 0.1 MG tablet Take 1 tablet (0.1 mg total) by mouth 2 (two) times daily. 01/17/16  Yes Gladstone Lighter, MD  diphenhydrAMINE (BENADRYL) 25 mg capsule Take 2 capsules (50 mg total) by mouth every 6 (six) hours as needed. 10/16/16  Yes Carrie Mew, MD  furosemide (LASIX) 80 MG tablet Take 1 tablet (80 mg total) by mouth  daily. 03/05/16 11/10/17 Yes Hackney, Otila Kluver A, FNP  gabapentin (NEURONTIN) 300 MG capsule Take 300 mg 2 (two) times daily by mouth.    Yes [provider]  hydrALAZINE (APRESOLINE) 50 MG tablet Take 1 tablet (50 mg total) by mouth 3 (three) times daily. 02/02/17  Yes Wieting, Richard, MD  lidocaine (XYLOCAINE) 5 % ointment Apply topically.   Yes [provider]  losartan (COZAAR) 50 MG tablet Take 1 tablet (50 mg total) daily by mouth. 06/24/17 09/22/17 Yes Hackney, Aura Fey, FNP  metoCLOPramide (REGLAN) 5 MG tablet Take 1 tablet (5 mg total) by mouth 2 (two) times daily as needed (nausea, vomiting or headache). 08/05/17 08/05/18 Yes Earleen Newport, MD  metoprolol succinate (TOPROL-XL) 50 MG 24 hr tablet Take 50 mg daily by mouth.    Yes [provider]  omeprazole (PRILOSEC) 20 MG capsule Take 1 capsule by mouth daily. 08/06/17  Yes [provider]  ondansetron (ZOFRAN) 4 MG tablet Take 1 tablet (4 mg total) by mouth every 8 (eight) hours as needed for nausea or vomiting. 07/21/16  Yes Daymon Larsen, MD  oxyCODONE-acetaminophen (PERCOCET) 10-325 MG tablet Take 1 tablet by mouth daily as needed for pain.   Yes [provider]  prochlorperazine (COMPAZINE) 10 MG tablet Take 1 tablet (10 mg total) by mouth every 6 (six) hours as needed for nausea. 10/15/16  Yes Paulette Blanch, MD  SENSIPAR 30 MG tablet Take 1 tablet (30 mg total) by mouth daily. 07/07/16  Yes Fritzi Mandes, MD  VENTOLIN HFA 108 (90 Base) MCG/ACT inhaler Inhale 2 puffs into the lungs daily as needed.  05/21/16  Yes [provider]  acetaminophen (TYLENOL) 325 MG tablet Take 2 tablets (650 mg total) by mouth every 6 (six) hours as needed for mild pain or headache. Patient taking differently: Take 325 mg every 6 (six) hours as needed by mouth for mild pain or headache.  03/18/16   Gouru, Illene Silver, MD  nitroGLYCERIN (NITROSTAT) 0.4 MG SL tablet Place 1 tablet (0.4 mg total) under the tongue every  5 (five) minutes as needed for chest pain. 01/14/17 08/17/17  Minna Merritts, MD    REVIEW OF SYSTEMS:  Review of Systems  Constitutional: Negative for chills, fever, malaise/fatigue and weight loss.  HENT: Negative for ear pain, hearing loss and tinnitus.   Eyes: Negative for blurred vision, double vision, pain and redness.  Respiratory: Positive for shortness of breath. Negative for cough and hemoptysis.   Cardiovascular: Negative for chest pain, palpitations, orthopnea and leg swelling.  Gastrointestinal: Negative for abdominal pain, constipation, diarrhea, nausea and vomiting.  Genitourinary: Negative for dysuria, frequency and hematuria.  Musculoskeletal: Negative for back pain, joint pain and neck pain.  Skin:       No acne, rash, or lesions  Neurological: Negative for dizziness,  tremors, focal weakness and weakness.  Endo/Heme/Allergies: Negative for polydipsia. Does not bruise/bleed easily.  Psychiatric/Behavioral: Negative for depression. The patient is not nervous/anxious and does not have insomnia.      VITAL SIGNS:   Vitals:   09/06/17 2200 09/06/17 2236 09/06/17 2300 09/06/17 2330  BP: (!) 195/117  (!) 199/112 (!) 183/103  Pulse: 95 93 100 96  Resp: (!) 23 (!) 25 18 (!) 23  SpO2: 92% 92% (!) 83% 91%  Weight:      Height:       Wt Readings from Last 3 Encounters:  09/06/17 81.6 kg (180 lb)  08/17/17 84 kg (185 lb 2 oz)  08/05/17 83 kg (183 lb)    PHYSICAL EXAMINATION:  Physical Exam  Vitals reviewed. Constitutional: She is oriented to person, place, and time. She appears well-developed and well-nourished. No distress.  HENT:  Head: Normocephalic and atraumatic.  Mouth/Throat: Oropharynx is clear and moist.  Eyes: Conjunctivae and EOM are normal. Pupils are equal, round, and reactive to light. No scleral icterus.  Neck: Normal range of motion. Neck supple. No JVD present. No thyromegaly present.  Cardiovascular: Normal rate, regular rhythm and intact distal  pulses. Exam reveals no gallop and no friction rub.  No murmur heard. Respiratory: Effort normal. No respiratory distress. She has no wheezes. She has rales.  GI: Soft. Bowel sounds are normal. She exhibits no distension. There is no tenderness.  Musculoskeletal: Normal range of motion. She exhibits no edema.  No arthritis, no gout  Lymphadenopathy:    She has no cervical adenopathy.  Neurological: She is alert and oriented to person, place, and time. No cranial nerve deficit.  No dysarthria, no aphasia  Skin: Skin is warm and dry. No rash noted. No erythema.  Psychiatric: She has a normal mood and affect. Her behavior is normal. Judgment and thought content normal.    LABORATORY PANEL:   CBC Recent Labs  Lab 09/06/17 1638  WBC 10.2  HGB 8.5*  HCT 26.8*  PLT 344   ------------------------------------------------------------------------------------------------------------------  Chemistries  Recent Labs  Lab 09/06/17 1638  NA 139  K 4.5  CL 100*  CO2 24  GLUCOSE 81  BUN 51*  CREATININE 11.55*  CALCIUM 9.5   ------------------------------------------------------------------------------------------------------------------  Cardiac Enzymes Recent Labs  Lab 09/06/17 2206  TROPONINI 0.08*   ------------------------------------------------------------------------------------------------------------------  RADIOLOGY:  Dg Chest 2 View  Result Date: 09/06/2017 CLINICAL DATA:  Worsening cough and shortness of breath since being at an outdoor event on Saturday. History of congestive heart failure, COPD, hypertension. EXAM: CHEST  2 VIEW COMPARISON:  07/21/2017 FINDINGS: The heart is enlarged. There are increased opacities throughout the lungs bilaterally, with reticulonodular appearance. No discrete consolidations. No pleural effusions. Surgical clips in the left axilla. IMPRESSION: Cardiomegaly and increased pulmonary opacities. Findings are most likely related to pulmonary  edema. However, close follow-up is recommended given the appearance of nodular opacities to ensure resolution. Electronically Signed   By: Nolon Nations M.D.   On: 09/06/2017 17:17    EKG:   Orders placed or performed during the hospital encounter of 09/06/17  . EKG 12-Lead  . EKG 12-Lead  . ED EKG within 10 minutes  . ED EKG within 10 minutes    IMPRESSION AND PLAN:  Principal Problem:   Acute on chronic diastolic CHF (congestive heart failure) (HCC) -IV Lasix given, patient will likely need dialysis for complete fluid control, IV antihypertensives given, cardiology consult placed Active Problems:   Accelerated hypertension -continue home meds, as  well as additional PRN antihypertensives for blood pressure control, goal less than 160/100   ESRD on dialysis [facility]) -nephrology consult for dialysis   COPD (chronic obstructive pulmonary disease) ([place]) -home dose inhalers   Diabetes ([place]) -sliding scale insulin with corresponding glucose checks  All the records are reviewed and case discussed with ED provider. Management plans discussed with the patient and/or family.  DVT PROPHYLAXIS: SubQ heparin  GI PROPHYLAXIS: None  ADMISSION STATUS: Inpatient  CODE STATUS: Full Code Status History    Date Active Date Inactive Code Status Order ID Comments User Context   06/15/2017 19:46 06/17/2017 18:43 Full Code [id number]  Dustin Flock, MD Inpatient   02/01/2017 07:47 02/02/2017 17:40 Full Code [id number]  Loletha Grayer, MD ED   11/10/2016 08:21 11/11/2016 17:54 Full Code [id number]  NorthlakeUbaldo Glassing, DO Inpatient   07/04/2016 05:09 07/07/2016 20:31 Full Code [id number]  Mikael Spray, NP ED   06/09/2016 04:58 06/10/2016 19:32 Full Code [id number]  Awilda Bill, NP ED   05/11/2016 10:24 05/12/2016 18:26 Full Code [id number]  Vaughan Basta, MD Inpatient   04/28/2016 20:30 04/30/2016 17:49 Full Code [id number]  Gladstone Lighter, MD Inpatient   03/28/2016 18:25 03/29/2016 17:26 Full  Code [id number]  Vaughan Basta, MD ED   03/17/2016 06:06 03/18/2016 07:51 Full Code [id number]  Saundra Shelling, MD ED   01/10/2016 21:11 01/17/2016 21:36 Full Code [id number]  Mikael Spray, NP ED   12/21/2015 06:08 12/22/2015 16:09 Full Code [id number]  Saundra Shelling, MD Inpatient   02/24/2015 03:52 02/24/2015 16:15 Full Code [id number]  Harrie Foreman, MD Inpatient      TOTAL TIME TAKING CARE OF THIS PATIENT: 45 minutes.   Kalle Bernath [place] 09/07/2017, 12:33 AM  [facility]  Office  [phone]  CC: Primary care physician; Theotis Burrow, MD  Note:  This document was prepared using Dragon voice recognition software and may include unintentional dictation errors.

## 2017-09-07 NOTE — Progress Notes (Signed)
TX completed.

## 2017-09-07 NOTE — Care Management (Addendum)
Notified Jasmine Holloway with [facility] pf admission. Chronic home 02 with [facility].  Informed attending no home health needs as the "home health" she currently has are medicaid pcs services through [employer].  Uses [facility] for transportation

## 2017-09-07 NOTE — Discharge Summary (Signed)
[facility] NAME: Jasmine Holloway    MR#:  [id number]  DATE OF BIRTH:  06-09-1961  DATE OF ADMISSION:  09/06/2017 ADMITTING PHYSICIAN: Lance Coon, MD  DATE OF DISCHARGE: 09/07/2017  PRIMARY CARE PHYSICIAN: Theotis Burrow, MD    ADMISSION DIAGNOSIS:  Bronchitis [J40] COPD exacerbation ([place]) [J44.1] Troponin I above reference range [R74.8]  DISCHARGE DIAGNOSIS:  Principal Problem:   Acute on chronic diastolic CHF (congestive heart failure) (HCC) Active Problems:   ESRD on dialysis ([place])   COPD (chronic obstructive pulmonary disease) ([place])   Diabetes (HCC)   Accelerated hypertension   SECONDARY DIAGNOSIS:   Past Medical History:  Diagnosis Date  . Asthma   . CHF (congestive heart failure) ([place])   . COPD (chronic obstructive pulmonary disease) ([place])   . Diabetes mellitus without complication ([place])   . Diastolic heart failure ([place])   . ESRD (end stage renal disease) on dialysis ([place])   . Hypertension   . Neuropathy   . Polycystic kidney disease   . Renal insufficiency     HOSPITAL COURSE:  57 year old female with a history of chronic hypoxic respiratory failure due to COPD, chronic diastolic heart failure with preserved ejection fraction, end-stage renal disease on hemodialysis and polycystic kidney disease who presented to the emergency room due to shortness of breath.   1. Acute on chronic diastolic heart failure in the setting of accelerated essential hypertension and fluid overload.to underlying end-stage renal disease. Patient underwent hemodialysis and her symptoms have improved. She will have a referral to CHF outpatient clinic She was evaluated by cardiology while in the hospital.  2. Elevated troponin: This is due to poor renal clearance. Patient was ruled out for ACS.  3. End-stage renal disease on hemodialysis: Patient will resume dialysis Tuesday, Thursday and Saturday. She did have dialysis  today.  4. Accelerated essential hypertension: Cardiology is recommending to increase losartan to 100 mg daily and hydralazine to 100 mg by mouth 3 times a day  5. Chronic hypoxic respiratory failure due to COPD without signs of exacerbation  DISCHARGE CONDITIONS AND DIET:  Stable for discharge on renal diet  CONSULTS OBTAINED:  Treatment Team:  Nelva Bush, MD Minna Merritts, MD Lavonia Dana, MD  DRUG ALLERGIES:   Allergies  Allergen Reactions  . Morphine And Related Nausea And Vomiting  . Strawberry Extract   . Tramadol Nausea And Vomiting  . Venofer [Iron Sucrose]   . Vicodin [Hydrocodone-Acetaminophen] Nausea And Vomiting  . Buprenorphine Hcl Nausea And Vomiting  . Codeine Nausea And Vomiting  . Morphine Nausea And Vomiting    Halllucinations    DISCHARGE MEDICATIONS:   Allergies as of 09/07/2017      Reactions   Morphine And Related Nausea And Vomiting   Strawberry Extract    Tramadol Nausea And Vomiting   Venofer [iron Sucrose]    Vicodin [hydrocodone-acetaminophen] Nausea And Vomiting   Buprenorphine Hcl Nausea And Vomiting   Codeine Nausea And Vomiting   Morphine Nausea And Vomiting   Halllucinations      Medication List    TAKE these medications   acetaminophen 325 MG tablet Commonly known as:  TYLENOL Take 2 tablets (650 mg total) by mouth every 6 (six) hours as needed for mild pain or headache. What changed:  how much to take   amitriptyline 10 MG tablet Commonly known as:  ELAVIL Take 10 mg by mouth at bedtime as needed for sleep.   amLODipine 5  MG tablet Commonly known as:  NORVASC Take 5 mg daily by mouth.   aspirin 81 MG chewable tablet Chew 1 tablet (81 mg total) by mouth daily.   b complex-vitamin c-folic acid 0.8 MG Tabs tablet Take 1 tablet by mouth at bedtime.   calcium acetate 667 MG capsule Commonly known as:  PHOSLO Take 1 capsule (667 mg total) by mouth 3 (three) times daily with meals. What changed:  how much to  take   cloNIDine 0.1 MG tablet Commonly known as:  CATAPRES Take 1 tablet (0.1 mg total) by mouth 2 (two) times daily.   diphenhydrAMINE 25 mg capsule Commonly known as:  BENADRYL Take 2 capsules (50 mg total) by mouth every 6 (six) hours as needed.   furosemide 80 MG tablet Commonly known as:  LASIX Take 1 tablet (80 mg total) by mouth daily.   gabapentin 300 MG capsule Commonly known as:  NEURONTIN Take 300 mg 2 (two) times daily by mouth.   hydrALAZINE 100 MG tablet Commonly known as:  APRESOLINE Take 1 tablet (100 mg total) by mouth 3 (three) times daily. What changed:    medication strength  how much to take   lidocaine 5 % ointment Commonly known as:  XYLOCAINE Apply topically.   losartan 100 MG tablet Commonly known as:  COZAAR Take 1 tablet (100 mg total) by mouth daily. What changed:    medication strength  how much to take   metoCLOPramide 5 MG tablet Commonly known as:  REGLAN Take 1 tablet (5 mg total) by mouth 2 (two) times daily as needed (nausea, vomiting or headache).   metoprolol succinate 50 MG 24 hr tablet Commonly known as:  TOPROL-XL Take 50 mg daily by mouth.   nitroGLYCERIN 0.4 MG SL tablet Commonly known as:  NITROSTAT Place 1 tablet (0.4 mg total) under the tongue every 5 (five) minutes as needed for chest pain.   omeprazole 20 MG capsule Commonly known as:  PRILOSEC Take 1 capsule by mouth daily.   ondansetron 4 MG tablet Commonly known as:  ZOFRAN Take 1 tablet (4 mg total) by mouth every 8 (eight) hours as needed for nausea or vomiting.   oxyCODONE-acetaminophen 10-325 MG tablet Commonly known as:  PERCOCET Take 1 tablet by mouth daily as needed for pain.   prochlorperazine 10 MG tablet Commonly known as:  COMPAZINE Take 1 tablet (10 mg total) by mouth every 6 (six) hours as needed for nausea.   SENSIPAR 30 MG tablet Generic drug:  cinacalcet Take 1 tablet (30 mg total) by mouth daily.   VENTOLIN HFA 108 (90 Base)  MCG/ACT inhaler Generic drug:  albuterol Inhale 2 puffs into the lungs daily as needed.         Today   CHIEF COMPLAINT:  I feel 100% better after dialysis   VITAL SIGNS:  Blood pressure (!) 171/96, pulse 87, temperature 98.3 F (36.8 C), temperature source Oral, resp. rate (!) 26, height 5\' 3"  (1.6 m), weight 83.1 kg (183 lb 3.2 oz), SpO2 99 %.   REVIEW OF SYSTEMS:  Review of Systems  Constitutional: Negative.  Negative for chills, fever and malaise/fatigue.  HENT: Negative.  Negative for ear discharge, ear pain, hearing loss, nosebleeds and sore throat.   Eyes: Negative.  Negative for blurred vision and pain.  Respiratory: Negative.  Negative for cough, hemoptysis, shortness of breath and wheezing.   Cardiovascular: Negative.  Negative for chest pain, palpitations and leg swelling.  Gastrointestinal: Negative.  Negative for abdominal  pain, blood in stool, diarrhea, nausea and vomiting.  Genitourinary: Negative.  Negative for dysuria.  Musculoskeletal: Negative.  Negative for back pain.  Skin: Negative.   Neurological: Negative for dizziness, tremors, speech change, focal weakness, seizures and headaches.  Endo/Heme/Allergies: Negative.  Does not bruise/bleed easily.  Psychiatric/Behavioral: Negative.  Negative for depression, hallucinations and suicidal ideas.     PHYSICAL EXAMINATION:  GENERAL:  57 y.o.-year-old patient lying in the bed with no acute distress.  NECK:  Supple, no jugular venous distention. No thyroid enlargement, no tenderness.  LUNGS: Normal breath sounds bilaterally, no wheezing, rales,rhonchi  No use of accessory muscles of respiration.  CARDIOVASCULAR: S1, S2 normal. No murmurs, rubs, or gallops.  ABDOMEN: Soft, non-tender, non-distended. Bowel sounds present. No organomegaly or mass.  EXTREMITIES: No pedal edema, cyanosis, or clubbing.  PSYCHIATRIC: The patient is alert and oriented x 3.  SKIN: No obvious rash, lesion, or ulcer.   DATA REVIEW:    CBC Recent Labs  Lab 09/07/17 0419  WBC 10.5  HGB 8.0*  HCT 25.7*  PLT 377    Chemistries  Recent Labs  Lab 09/07/17 0419  NA 137  K 5.3*  CL 99*  CO2 25  GLUCOSE 240*  BUN 64*  CREATININE 12.82*  CALCIUM 9.0    Cardiac Enzymes Recent Labs  Lab 09/06/17 2206 09/07/17 0419 09/07/17 0935  TROPONINI 0.08* 0.05* 0.05*    Microbiology Results  @MICRORSLT48 @  RADIOLOGY:  Dg Chest 2 View  Result Date: 09/06/2017 CLINICAL DATA:  Worsening cough and shortness of breath since being at an outdoor event on Saturday. History of congestive heart failure, COPD, hypertension. EXAM: CHEST  2 VIEW COMPARISON:  07/21/2017 FINDINGS: The heart is enlarged. There are increased opacities throughout the lungs bilaterally, with reticulonodular appearance. No discrete consolidations. No pleural effusions. Surgical clips in the left axilla. IMPRESSION: Cardiomegaly and increased pulmonary opacities. Findings are most likely related to pulmonary edema. However, close follow-up is recommended given the appearance of nodular opacities to ensure resolution. Electronically Signed   By: Nolon Nations M.D.   On: 09/06/2017 17:17      Allergies as of 09/07/2017      Reactions   Morphine And Related Nausea And Vomiting   Strawberry Extract    Tramadol Nausea And Vomiting   Venofer [iron Sucrose]    Vicodin [hydrocodone-acetaminophen] Nausea And Vomiting   Buprenorphine Hcl Nausea And Vomiting   Codeine Nausea And Vomiting   Morphine Nausea And Vomiting   Halllucinations      Medication List    TAKE these medications   acetaminophen 325 MG tablet Commonly known as:  TYLENOL Take 2 tablets (650 mg total) by mouth every 6 (six) hours as needed for mild pain or headache. What changed:  how much to take   amitriptyline 10 MG tablet Commonly known as:  ELAVIL Take 10 mg by mouth at bedtime as needed for sleep.   amLODipine 5 MG tablet Commonly known as:  NORVASC Take 5 mg daily by  mouth.   aspirin 81 MG chewable tablet Chew 1 tablet (81 mg total) by mouth daily.   b complex-vitamin c-folic acid 0.8 MG Tabs tablet Take 1 tablet by mouth at bedtime.   calcium acetate 667 MG capsule Commonly known as:  PHOSLO Take 1 capsule (667 mg total) by mouth 3 (three) times daily with meals. What changed:  how much to take   cloNIDine 0.1 MG tablet Commonly known as:  CATAPRES Take 1 tablet (0.1 mg total)  by mouth 2 (two) times daily.   diphenhydrAMINE 25 mg capsule Commonly known as:  BENADRYL Take 2 capsules (50 mg total) by mouth every 6 (six) hours as needed.   furosemide 80 MG tablet Commonly known as:  LASIX Take 1 tablet (80 mg total) by mouth daily.   gabapentin 300 MG capsule Commonly known as:  NEURONTIN Take 300 mg 2 (two) times daily by mouth.   hydrALAZINE 100 MG tablet Commonly known as:  APRESOLINE Take 1 tablet (100 mg total) by mouth 3 (three) times daily. What changed:    medication strength  how much to take   lidocaine 5 % ointment Commonly known as:  XYLOCAINE Apply topically.   losartan 100 MG tablet Commonly known as:  COZAAR Take 1 tablet (100 mg total) by mouth daily. What changed:    medication strength  how much to take   metoCLOPramide 5 MG tablet Commonly known as:  REGLAN Take 1 tablet (5 mg total) by mouth 2 (two) times daily as needed (nausea, vomiting or headache).   metoprolol succinate 50 MG 24 hr tablet Commonly known as:  TOPROL-XL Take 50 mg daily by mouth.   nitroGLYCERIN 0.4 MG SL tablet Commonly known as:  NITROSTAT Place 1 tablet (0.4 mg total) under the tongue every 5 (five) minutes as needed for chest pain.   omeprazole 20 MG capsule Commonly known as:  PRILOSEC Take 1 capsule by mouth daily.   ondansetron 4 MG tablet Commonly known as:  ZOFRAN Take 1 tablet (4 mg total) by mouth every 8 (eight) hours as needed for nausea or vomiting.   oxyCODONE-acetaminophen 10-325 MG tablet Commonly known  as:  PERCOCET Take 1 tablet by mouth daily as needed for pain.   prochlorperazine 10 MG tablet Commonly known as:  COMPAZINE Take 1 tablet (10 mg total) by mouth every 6 (six) hours as needed for nausea.   SENSIPAR 30 MG tablet Generic drug:  cinacalcet Take 1 tablet (30 mg total) by mouth daily.   VENTOLIN HFA 108 (90 Base) MCG/ACT inhaler Generic drug:  albuterol Inhale 2 puffs into the lungs daily as needed.         Management plans discussed with the patient and she is in agreement. Stable for discharge home with [facility]  Patient should follow up with pcp  CODE STATUS:     Code Status Orders  (From admission, onward)        Start     Ordered   09/07/17 0231  Full code  Continuous     09/07/17 0230    Code Status History    Date Active Date Inactive Code Status Order ID Comments User Context   06/15/2017 19:46 06/17/2017 18:43 Full Code [id number]  Dustin Flock, MD Inpatient   02/01/2017 07:47 02/02/2017 17:40 Full Code [id number]  Loletha Grayer, MD ED   11/10/2016 08:21 11/11/2016 17:54 Full Code [id number]  [place]baldo Glass[place], DO Inpatient   07/04/2016 05:09 07/07/2016 20:31 Full Code [id number]  Mikael Spray, NP ED   06/09/2016 04:58 06/10/2016 19:32 Full Code [id number]  Awilda Bill, NP ED   05/11/2016 10:24 05/12/2016 18:26 Full Code [id number]  Vaughan Basta, MD Inpatient   04/28/2016 20:30 04/30/2016 17:49 Full Code [id number]  Gladstone Lighter, MD Inpatient   03/28/2016 18:25 03/29/2016 17:26 Full Code [id number]  Vaughan Basta, MD ED   03/17/2016 06:06 03/18/2016 07:51 Full Code [id number]  Saundra Shelling, MD ED   01/10/2016 21:11 01/17/2016 21:36 Full Code [id number]  Dorene Sorrow  S, NP ED   12/21/2015 06:08 12/22/2015 16:09 Full Code [id number]  Saundra Shelling, MD Inpatient   02/24/2015 03:52 02/24/2015 16:15 Full Code [id number]  Harrie Foreman, MD Inpatient      TOTAL TIME TAKING CARE OF THIS PATIENT: 38 minutes.    Note: This dictation was  prepared with Dragon dictation along with smaller phrase technology. Any transcriptional errors that result from this process are unintentional.  Juanda Luba M.D on 09/07/2017 at 2:49 PM  Between 7am to 6pm - Pager - [phone] After 6pm go to [email or web address] - password [email or web address] [facility]  Office  [phone]  CC: Primary care physician; Theotis Burrow, MD

## 2017-09-07 NOTE — Consult Note (Signed)
Cardiology Consultation:   Patient ID: Jasmine Holloway; [id number]; [birth year]   Admit date: 09/06/2017 Date of Consult: 09/07/2017  Primary Care Provider: Theotis Burrow, MD Primary Cardiologist: Rockey Situ   Patient Profile:   Jasmine Holloway is a 57 y.o. female with a hx of nonobstructive CAD by left heart cath 03/2016 by outside group, chronic respiratory failure with hypoxia on 2-4 L at baseline via nasal cannula 2/2 COPD 2/2 tobacco abuse, chronic diastolic CHF, polycystic kidney disease, ESRD on HD (TTS), anemia of chronic disease, dietary noncompliance, poorly controlled hypertension, DM2, and asthma who is being seen today for the evaluation of acute on chronic diastolic CHF at the request of Dr. Jannifer Franklin.  History of Present Illness:   Ms. Couse has had frequent ED/hospital admissions for volume overload and COPD exacerbations requiring dialysis and treatment with BiPAP, nebulizers, and steroids along with antibiotics.  Blood pressure has been very poorly controlled running in the 180s to greater than 762 systolic.  Weights have been increasing since spring 2018 at which time she was weighing in the 170s, more recently and to the mid 180s.  She reports compliance with her dialysis sessions.  She denies any recent missed sessions or sessions having to be cut short secondary to soft blood pressure or cramping.  She has in the past had complications with cramping from dialysis.  Last seen by Dr. Rockey Situ in 01/2017 noted to have blood pressure greater than 831 systolic.  Given clonidine in the office.  Continued on current medications.  She has since been followed by the [facility] CHF clinic and felt to be euvolemic without any medication changes.  Most recent echo from 11/2016 showed EF of 51-76%, grade 2 diastolic dysfunction, mild AR/MR, PASP normal.  She reports continued noncompliance with heart healthy diet.  She reports, "I live for salt."  Reports daily consumption of pretzels, cheese  noodles, and potato chips.  Notes when she eats excess salt she develops chest heaviness and headache.  She was diagnosed with chronic respiratory failure with hypoxia in the spring 2018 and is on 2 L oxygen via nasal cannula at rest and frequently has to increase this to 4 L via nasal cannula with ambulation.  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  She also does not wear her oxygen when she goes out and about.  3 days prior to her her admission she was at a local skating rink in [place], [place] and developed shortness of breath while not wearing her oxygen while she was ambulating to the concession stand.  She was not actually skating, she was there watching her son at a birthday party.  Because of her shortness of breath, she left and returned home.  She continued to be short of breath, calling her boyfriend who stayed with her that night.  Because her shortness of breath persisted into Monday (patient wanted to wait until she got "money on my card to pay my water bill") prior to coming to the hospital.  Upon her arrival to [facility] she was noted to have poorly controlled blood pressure ranging from the 160V-371G systolic.  Reported weight of 182-184 pounds.  Hemoglobin noted to be 8.5 downtrending to 8.0 this morning with a baseline of approximately 10.8.  Troponin minimally elevated with a peak of 0.08 and flat trending.  BNP elevated at 1148 which is consistent with  her prior readings from November, 2018.  Chest x-ray showed cardiomegaly with increased pulmonary opacities.  She was continued on home medications upon admission and nephrology/cardiology consults were placed.   Past Medical History:  Diagnosis Date  . Asthma   . CHF (congestive heart failure) ([place])   . COPD (chronic obstructive pulmonary disease) ([place])   . Diabetes mellitus without complication ([place])   . Diastolic heart failure  ([place])   . ESRD (end stage renal disease) on dialysis ([place])   . Hypertension   . Neuropathy   . Polycystic kidney disease   . Renal insufficiency     Past Surgical History:  Procedure Laterality Date  . ARTERIOVENOUS GRAFT PLACEMENT Right    x3 (R forearm currently used for access)  . BREAST BIOPSY Left 12/03/2015   neg  . COLON SURGERY    . ESOPHAGOGASTRODUODENOSCOPY (EGD) WITH PROPOFOL N/A 02/04/2017   Procedure: ESOPHAGOGASTRODUODENOSCOPY (EGD) WITH PROPOFOL;  Surgeon: Jonathon Bellows, MD;  Location: [facility] ENDOSCOPY;  Service: Endoscopy;  Laterality: N/A;  . PERIPHERAL VASCULAR CATHETERIZATION Right 08/04/2016   Procedure: A/V Shuntogram;  Surgeon: Algernon Huxley, MD;  Location: [place] CV LAB;  Service: Cardiovascular;  Laterality: Right;  . VEIN HARVEST       Home Meds: Prior to Admission medications   Medication Sig Start Date End Date Taking? Authorizing Provider  amitriptyline (ELAVIL) 10 MG tablet Take 10 mg by mouth at bedtime as needed for sleep.   Yes [provider]  amLODipine (NORVASC) 5 MG tablet Take 5 mg daily by mouth.  08/31/14  Yes [provider]  aspirin 81 MG chewable tablet Chew 1 tablet (81 mg total) by mouth daily. 01/17/16  Yes Gladstone Lighter, MD  b complex-vitamin c-folic acid (NEPHRO-VITE) 0.8 MG TABS tablet Take 1 tablet by mouth at bedtime.   Yes [provider]  calcium acetate (PHOSLO) 667 MG capsule Take 1 capsule (667 mg total) by mouth 3 (three) times daily with meals. Patient taking differently: Take 1,334 mg by mouth 3 (three) times daily with meals.  02/02/17  Yes Wieting, Richard, MD  cloNIDine (CATAPRES) 0.1 MG tablet Take 1 tablet (0.1 mg total) by mouth 2 (two) times daily. 01/17/16  Yes Gladstone Lighter, MD  diphenhydrAMINE (BENADRYL) 25 mg capsule Take 2 capsules (50 mg total) by mouth every 6 (six) hours as needed. 10/16/16  Yes Carrie Mew, MD  furosemide (LASIX) 80 MG tablet Take 1 tablet (80 mg total) by  mouth daily. 03/05/16 11/10/17 Yes Hackney, Otila Kluver A, FNP  gabapentin (NEURONTIN) 300 MG capsule Take 300 mg 2 (two) times daily by mouth.    Yes [provider]  hydrALAZINE (APRESOLINE) 50 MG tablet Take 1 tablet (50 mg total) by mouth 3 (three) times daily. 02/02/17  Yes Wieting, Richard, MD  lidocaine (XYLOCAINE) 5 % ointment Apply topically.   Yes [provider]  losartan (COZAAR) 50 MG tablet Take 1 tablet (50 mg total) daily by mouth. 06/24/17 09/22/17 Yes Hackney, Aura Fey, FNP  metoCLOPramide (REGLAN) 5 MG tablet Take 1 tablet (5 mg total) by mouth 2 (two) times daily as needed (nausea, vomiting or headache). 08/05/17 08/05/18 Yes Earleen Newport, MD  metoprolol succinate (TOPROL-XL) 50 MG 24 hr tablet Take 50 mg daily by mouth.    Yes [provider]  omeprazole (PRILOSEC) 20 MG capsule Take 1 capsule by mouth daily. 08/06/17  Yes [provider]  ondansetron (ZOFRAN) 4 MG tablet Take 1 tablet (4 mg total) by mouth  every 8 (eight) hours as needed for nausea or vomiting. 07/21/16  Yes Daymon Larsen, MD  oxyCODONE-acetaminophen (PERCOCET) 10-325 MG tablet Take 1 tablet by mouth daily as needed for pain.   Yes [provider]  prochlorperazine (COMPAZINE) 10 MG tablet Take 1 tablet (10 mg total) by mouth every 6 (six) hours as needed for nausea. 10/15/16  Yes Paulette Blanch, MD  SENSIPAR 30 MG tablet Take 1 tablet (30 mg total) by mouth daily. 07/07/16  Yes Fritzi Mandes, MD  VENTOLIN HFA 108 (90 Base) MCG/ACT inhaler Inhale 2 puffs into the lungs daily as needed.  05/21/16  Yes [provider]  acetaminophen (TYLENOL) 325 MG tablet Take 2 tablets (650 mg total) by mouth every 6 (six) hours as needed for mild pain or headache. Patient taking differently: Take 325 mg every 6 (six) hours as needed by mouth for mild pain or headache.  03/18/16   Gouru, Illene Silver, MD  nitroGLYCERIN (NITROSTAT) 0.4 MG SL tablet Place 1 tablet (0.4 mg total) under the tongue  every 5 (five) minutes as needed for chest pain. 01/14/17 08/17/17  Minna Merritts, MD    Inpatient Medications: Scheduled Meds: . amLODipine  5 mg Oral Daily  . aspirin  81 mg Oral Daily  . cinacalcet  30 mg Oral Q breakfast  . cloNIDine  0.1 mg Oral BID  . epoetin (EPOGEN/PROCRIT) injection  10,000 Units Intravenous Q T,Th,Sa-HD  . furosemide  80 mg Oral Daily  . gabapentin  300 mg Oral BID  . heparin  5,000 Units Subcutaneous Q8H  . hydrALAZINE  100 mg Oral TID  . insulin aspart  0-5 Units Subcutaneous QHS  . insulin aspart  0-9 Units Subcutaneous TID WC  . losartan  100 mg Oral Daily  . mouth rinse  15 mL Mouth Rinse BID  . metoprolol succinate  50 mg Oral Daily  . pantoprazole  40 mg Oral Daily   Continuous Infusions:  PRN Meds: acetaminophen **OR** acetaminophen, labetalol, ondansetron **OR** ondansetron (ZOFRAN) IV, oxyCODONE-acetaminophen **AND** oxyCODONE  Allergies:   Allergies  Allergen Reactions  . Morphine And Related Nausea And Vomiting  . Strawberry Extract   . Tramadol Nausea And Vomiting  . Venofer [Iron Sucrose]   . Vicodin [Hydrocodone-Acetaminophen] Nausea And Vomiting  . Buprenorphine Hcl Nausea And Vomiting  . Codeine Nausea And Vomiting  . Morphine Nausea And Vomiting    Halllucinations    Social History:   Social History   Socioeconomic History  . Marital status: Single    Spouse name: Not on file  . Number of children: Not on file  . Years of education: Not on file  . Highest education level: Not on file  Social Needs  . Financial resource strain: Not on file  . Food insecurity - worry: Not on file  . Food insecurity - inability: Not on file  . Transportation needs - medical: Not on file  . Transportation needs - non-medical: Not on file  Occupational History  . Occupation: disabled  Tobacco Use  . Smoking status: Former Smoker    Packs/day: 0.25    Last attempt to quit: 08/11/2015    Years since quitting: 2.0  . Smokeless tobacco:  Never Used  Substance and Sexual Activity  . Alcohol use: No    Alcohol/week: 0.0 oz  . Drug use: No  . Sexual activity: Not on file  Other Topics Concern  . Not on file  Social History Narrative   Lives alone  Family History:   Family History  Problem Relation Age of Onset  . Hypertension Brother   . Heart failure Brother   . Hypertension Mother   . Heart disease Mother     ROS:  Review of Systems  Constitutional: Positive for malaise/fatigue. Negative for chills, diaphoresis, fever and weight loss.  HENT: Negative for congestion.   Eyes: Negative for discharge and redness.  Respiratory: Positive for shortness of breath. Negative for cough, hemoptysis, sputum production and wheezing.   Cardiovascular: Positive for orthopnea and leg swelling. Negative for chest pain, palpitations, claudication and PND.  Gastrointestinal: Negative for abdominal pain, blood in stool, heartburn, melena, nausea and vomiting.  Genitourinary: Negative for hematuria.  Musculoskeletal: Negative for falls and myalgias.  Skin: Negative for rash.  Neurological: Positive for weakness. Negative for dizziness, tingling, tremors, sensory change, speech change, focal weakness and loss of consciousness.  Endo/Heme/Allergies: Does not bruise/bleed easily.  Psychiatric/Behavioral: Negative for substance abuse. The patient is not nervous/anxious.   All other systems reviewed and are negative.     Physical Exam/Data:   Vitals:   09/07/17 1030 09/07/17 1045 09/07/17 1100 09/07/17 1115  BP: (!) 182/94 (!) 182/93 (!) 183/91 (!) 180/101  Pulse: 69 70 68 74  Resp: 20 19 20 17   Temp:      TempSrc:      SpO2: 96% 96% 93% 94%  Weight:      Height:       No intake or output data in the 24 hours ending 09/07/17 1120 Filed Weights   09/06/17 1641 09/07/17 0237 09/07/17 0926  Weight: 180 lb (81.6 kg) 182 lb (82.6 kg) 183 lb 3.2 oz (83.1 kg)   Body mass index is 32.45 kg/m.   Physical Exam: General: Well  developed, well nourished, in no acute distress. Head: Normocephalic, atraumatic, sclera non-icteric, no xanthomas, nares without discharge. Neck: Negative for carotid bruits. JVD mildly elevated. Lungs: Diminished breath sounds bilaterally. Breathing is unlabored. Heart: RRR with S1 S2. II/VI systolic murmur at the apex, no rubs, or gallops appreciated. Abdomen: Soft, non-tender, non-distended with normoactive bowel sounds. No hepatomegaly. No rebound/guarding. No obvious abdominal masses. Msk:  Strength and tone appear normal for age. Extremities: No clubbing or cyanosis. Trace pre-tibial edema. Distal pedal pulses are 2+ and equal bilaterally. Neuro: Alert and oriented X 3. No facial asymmetry. No focal deficit. Moves all extremities spontaneously. Psych:  Responds to questions appropriately with a normal affect.   EKG:  The EKG was personally reviewed and demonstrates: NSR, 99 bpm, right axis deviation, LVH, lateral TWI Telemetry:  Telemetry was personally reviewed and demonstrates: NSR  Weights: Filed Weights   09/06/17 1641 09/07/17 0237 09/07/17 0926  Weight: 180 lb (81.6 kg) 182 lb (82.6 kg) 183 lb 3.2 oz (83.1 kg)    Relevant CV Studies: TTE 11/2016: Study Conclusions  - Left ventricle: The cavity size was normal. There was moderate   concentric hypertrophy. Systolic function was normal. The   estimated ejection fraction was in the range of 50% to 55%. Wall   motion was normal; there were no regional wall motion   abnormalities. Features are consistent with a pseudonormal left   ventricular filling pattern, with concomitant abnormal relaxation   and increased filling pressure (grade 2 diastolic dysfunction). - Aortic valve: There was mild regurgitation. - Mitral valve: There was mild regurgitation. - Left atrium: The atrium was mildly dilated. - Right ventricle: Systolic function was normal. - Pulmonary arteries: Systolic pressure was within the normal  range.  Laboratory Data:  Chemistry Recent Labs  Lab 09/06/17 1638 09/07/17 0419  NA 139 137  K 4.5 5.3*  CL 100* 99*  CO2 24 25  GLUCOSE 81 240*  BUN 51* 64*  CREATININE 11.55* 12.82*  CALCIUM 9.5 9.0  GFRNONAA 3* 3*  GFRAA 4* 3*  ANIONGAP 15 13    No results for input(s): PROT, ALBUMIN, AST, ALT, ALKPHOS, BILITOT in the last 168 hours. Hematology Recent Labs  Lab 09/06/17 1638 09/07/17 0419  WBC 10.2 10.5  RBC 3.08* 2.96*  HGB 8.5* 8.0*  HCT 26.8* 25.7*  MCV 86.9 86.8  MCH 27.7 27.0  MCHC 31.8* 31.1*  RDW 21.0* 21.5*  PLT 344 377   Cardiac Enzymes Recent Labs  Lab 09/06/17 1638 09/06/17 2206 09/07/17 0419  TROPONINI 0.07* 0.08* 0.05*   No results for input(s): TROPIPOC in the last 168 hours.  BNP Recent Labs  Lab 09/06/17 1638  BNP 1,148.0*    DDimer No results for input(s): DDIMER in the last 168 hours.  Radiology/Studies:  Dg Chest 2 View  Result Date: 09/06/2017 CLINICAL DATA:  Worsening cough and shortness of breath since being at an outdoor event on Saturday. History of congestive heart failure, COPD, hypertension. EXAM: CHEST  2 VIEW COMPARISON:  07/21/2017 FINDINGS: The heart is enlarged. There are increased opacities throughout the lungs bilaterally, with reticulonodular appearance. No discrete consolidations. No pleural effusions. Surgical clips in the left axilla. IMPRESSION: Cardiomegaly and increased pulmonary opacities. Findings are most likely related to pulmonary edema. However, close follow-up is recommended given the appearance of nodular opacities to ensure resolution. Electronically Signed   By: Nolon Nations M.D.   On: 09/06/2017 17:17    Assessment and Plan:   1. Acute on chronic diastolic CHF: -Likely in the setting of dietary noncompliance and poorly controlled HTN -Patient reports, " I live for salt" -Volume managed by HD -Back in 11/2016 weight was running in the 170s pounds -Suspect she has been chronically holding on to  ~ 10 pounds of excess fluid -Increase HD as able -Continue PO Lasix  2. Accelerated HTN: -Increase losartan and hydralazine -Continue Toprol and Lasix  3. ESRD: -Per nephrology   4. Acute on chronic anemia of chronic disease: -Suspect this too is playing a role in her symptoms -Consider transfusion if she is not getting Epogen  -Defer to IM  5. Hyperkalemia: -HD today  6. Elevated troponin: -Never with chest pain -Minimally elevated with a peak of 0.08 -Likely supply demand ischemia from accelerated HTN and volume overload in the settingof poor clearance given her ESRD  7. Acute on chronic respiratory failure with hypoxia: -Continues to require 4 L via [place], baseline 2 L at rest and 4 L with ambulation -Has not followed up with pulmonology since being started on O2 in the spring of 2018 -Recommend she establish with pulmonology, defer this to IM  8. COPD: -Possibly playing a role in the above -Per IM   For questions or updates, please contact [place] Please consult [email or web address] for contact info under Cardiology/STEMI.   Signed, Christell Faith, PA-C [place] Pager: [phone] 09/07/2017, 11:20 AM

## 2017-09-14 ENCOUNTER — Encounter: Admit: 2017-09-14 | Discharge: 2017-09-14 | Payer: 59 | Attending: Nephrology | Primary: Nephrology

## 2017-09-15 NOTE — Progress Notes (Signed)
Subjective:    Patient ID: Jasmine Holloway, female    DOB: [birth year], 57 y.o.   MRN: [id number]  HPI   Ms Beever is a 57 y/o female with a history of asthma, DM, HTN, kidney disease (dialysis), COPD, previous tobacco use and chronic heart failure.   Reviewed echo report on 11/10/16 which showed an EF of 50-55% along with mild AR/MR. EF was 55-60% May 2017. Cardiac catheterization was done 03/17/16 and showed normal coronary arteries.   Admitted 09/06/17 due to HF exacerbation due to accelerated HTN. Cardiology consult was obtained. Elevated troponin thought to be due to poor renal clearance. Dialysis was done and patient was discharged the next day. Was in the ED 08/05/17 due to headache where she was treated and released. Was in the ED 07/21/17 with edema where she was treated and released. Was in the ED 07/05/17 with COPD exacerbation. Was given bipap, nebulizers and steroids. Was released with oral antibiotics. Admitted 06/15/17 due to HF exacerbation. Chest CT confirmed HF. Initially needed IV furosemide and then transitioned back to her oral diuretics. Also needed IV steroids and nebulizers initially. Nephrology was consulted and she was discharged home in 2 days. Was in the ED 04/10/17 but left without being seen. Was in the ED 04/09/17 and 03/29/17 with epistaxis. Both times she was was treated and released.   She presents today for a follow-up visit with a chief complaint of minimal fatigue upon moderate exertion. She describes this as chronic in nature having been present for several years. She does feel like it's a little worse since she's had some medication adjustments. She has associated shortness of breath, headaches, dizziness with position changes and difficulty sleeping. She denies any chest pain, cough, edema, palpitations, abdominal distention or weight gain.   Past Medical History:  Diagnosis Date  . Asthma   . CHF (congestive heart failure) ([place])   . COPD (chronic obstructive pulmonary  disease) ([place])   . Diabetes mellitus without complication ([place])   . Diastolic heart failure ([place])   . ESRD (end stage renal disease) on dialysis ([place])   . Hypertension   . Neuropathy   . Polycystic kidney disease   . Renal insufficiency     Past Surgical History:  Procedure Laterality Date  . ARTERIOVENOUS GRAFT PLACEMENT Right    x3 (R forearm currently used for access)  . BREAST BIOPSY Left 12/03/2015   neg  . COLON SURGERY    . ESOPHAGOGASTRODUODENOSCOPY (EGD) WITH PROPOFOL N/A 02/04/2017   Procedure: ESOPHAGOGASTRODUODENOSCOPY (EGD) WITH PROPOFOL;  Surgeon: Jonathon Bellows, MD;  [facility] ENDOSCOPY;  Service: Endoscopy;  Laterality: N/A;  . PERIPHERAL VASCULAR CATHETERIZATION Right 08/04/2016   Procedure: A/V Shuntogram;  Surgeon: Algernon Huxley, MD;  Location: [place] CV LAB;  Service: Cardiovascular;  Laterality: Right;  . VEIN HARVEST      Family History  Problem Relation Age of Onset  . Hypertension Brother   . Heart failure Brother   . Hypertension Mother   . Heart disease Mother     Social History   Tobacco Use  . Smoking status: Former Smoker    Packs/day: 0.25    Last attempt to quit: 08/11/2015    Years since quitting: 2.0  . Smokeless tobacco: Never Used  Substance Use Topics  . Alcohol use: No    Alcohol/week: 0.0 oz    Allergies  Allergen Reactions  . Morphine And Related Nausea And Vomiting  . Strawberry Extract   . Tramadol Nausea And  Vomiting  . Venofer [Iron Sucrose]   . Vicodin [Hydrocodone-Acetaminophen] Nausea And Vomiting  . Buprenorphine Hcl Nausea And Vomiting  . Codeine Nausea And Vomiting  . Morphine Nausea And Vomiting    Halllucinations   Prior to Admission medications   Medication Sig Start Date End Date Taking? Authorizing Provider  acetaminophen (TYLENOL) 325 MG tablet Take 2 tablets (650 mg total) by mouth every 6 (six) hours as needed for mild pain or headache. Patient taking differently: Take 325 mg every 6 (six) hours  as needed by mouth for mild pain or headache.  03/18/16  Yes Gouru, Aruna, MD  amitriptyline (ELAVIL) 10 MG tablet Take 10 mg by mouth at bedtime as needed for sleep.   Yes [provider]  amLODipine (NORVASC) 5 MG tablet Take 5 mg daily by mouth.  08/31/14  Yes [provider]  aspirin 81 MG chewable tablet Chew 1 tablet (81 mg total) by mouth daily. 01/17/16  Yes Gladstone Lighter, MD  b complex-vitamin c-folic acid (NEPHRO-VITE) 0.8 MG TABS tablet Take 1 tablet by mouth at bedtime.   Yes [provider]  calcium acetate (PHOSLO) 667 MG capsule Take 1 capsule (667 mg total) by mouth 3 (three) times daily with meals. Patient taking differently: Take 1,334 mg by mouth 3 (three) times daily with meals.  02/02/17  Yes Wieting, Richard, MD  cloNIDine (CATAPRES) 0.1 MG tablet Take 1 tablet (0.1 mg total) by mouth 2 (two) times daily. 01/17/16  Yes Gladstone Lighter, MD  diphenhydrAMINE (BENADRYL) 25 mg capsule Take 2 capsules (50 mg total) by mouth every 6 (six) hours as needed. 10/16/16  Yes Carrie Mew, MD  epoetin alfa (EPOGEN,PROCRIT) 76160 UNIT/ML injection 10,000 Units 3 (three) times a week.   Yes [provider]  furosemide (LASIX) 80 MG tablet Take 1 tablet (80 mg total) by mouth daily. 03/05/16 11/10/17 Yes Hackney, Otila Kluver A, FNP  gabapentin (NEURONTIN) 300 MG capsule Take 300 mg 2 (two) times daily by mouth.    Yes [provider]  hydrALAZINE (APRESOLINE) 100 MG tablet Take 1 tablet (100 mg total) by mouth 3 (three) times daily. 09/07/17  Yes Mody, Sital, MD  lidocaine (XYLOCAINE) 5 % ointment Apply topically.   Yes [provider]  losartan (COZAAR) 100 MG tablet Take 1 tablet (100 mg total) by mouth daily. 09/07/17  Yes Mody, Ulice Bold, MD  metoCLOPramide (REGLAN) 5 MG tablet Take 1 tablet (5 mg total) by mouth 2 (two) times daily as needed (nausea, vomiting or headache). 08/05/17 08/05/18 Yes Earleen Newport, MD  metoprolol succinate  (TOPROL-XL) 50 MG 24 hr tablet Take 50 mg daily by mouth.    Yes [provider]  nitroGLYCERIN (NITROSTAT) 0.4 MG SL tablet Place 1 tablet (0.4 mg total) under the tongue every 5 (five) minutes as needed for chest pain. 01/14/17 09/17/18 Yes Gollan, Kathlene November, MD  omeprazole (PRILOSEC) 20 MG capsule Take 1 capsule by mouth daily. 08/06/17  Yes [provider]  ondansetron (ZOFRAN) 4 MG tablet Take 1 tablet (4 mg total) by mouth every 8 (eight) hours as needed for nausea or vomiting. 07/21/16  Yes Daymon Larsen, MD  oxyCODONE-acetaminophen (PERCOCET) 10-325 MG tablet Take 1 tablet by mouth daily as needed for pain.   Yes [provider]  prochlorperazine (COMPAZINE) 10 MG tablet Take 1 tablet (10 mg total) by mouth every 6 (six) hours as needed for nausea. 10/15/16  Yes Paulette Blanch, MD  SENSIPAR 30 MG tablet Take 1  tablet (30 mg total) by mouth daily. 07/07/16  Yes Fritzi Mandes, MD  VENTOLIN HFA 108 (90 Base) MCG/ACT inhaler Inhale 2 puffs into the lungs daily as needed.  05/21/16  Yes [provider]    Review of Systems  Constitutional: Positive for fatigue. Negative for appetite change.  HENT: Negative for congestion, nosebleeds, sinus pressure and sore throat.   Eyes: Positive for visual disturbance (blurry vision). Negative for pain.  Respiratory: Positive for shortness of breath. Negative for cough and chest tightness.   Cardiovascular: Negative for chest pain, palpitations and leg swelling.  Gastrointestinal: Negative for abdominal distention and abdominal pain.  Endocrine: Negative.   Genitourinary: Negative.   Musculoskeletal: Negative for back pain and neck pain.  Skin: Negative.   Allergic/Immunologic: Negative.   Neurological: Positive for dizziness, numbness (tingling down left arm at times due to previous graft) and headaches. Negative for light-headedness.  Hematological: Negative for adenopathy. Does not bruise/bleed easily.   Psychiatric/Behavioral: Positive for sleep disturbance (sleeping on 2 pillows). Negative for dysphoric mood. The patient is not nervous/anxious.    Vitals:   09/17/17 0856  BP: 139/86  Pulse: 70  Resp: 18  SpO2: 94%  Weight: 177 lb 8 oz (80.5 kg)  Height: 5\' 3"  (1.6 m)   Wt Readings from Last 3 Encounters:  09/17/17 177 lb 8 oz (80.5 kg)  09/07/17 183 lb 3.2 oz (83.1 kg)  08/17/17 185 lb 2 oz (84 kg)   Lab Results  Component Value Date   CREATININE 12.82 (H) 09/07/2017   CREATININE 11.55 (H) 09/06/2017   CREATININE 9.27 (H) 08/05/2017    Objective:   Physical Exam  Constitutional: She is oriented to person, place, and time. She appears well-developed and well-nourished.  HENT:  Head: Normocephalic and atraumatic.  Neck: Normal range of motion. Neck supple.  Cardiovascular: Normal rate and regular rhythm.  Pulmonary/Chest: Effort normal. She has no wheezes. She has no rales.  Abdominal: Soft. She exhibits no distension. There is no tenderness.  Musculoskeletal: She exhibits no edema or tenderness.  Neurological: She is alert and oriented to person, place, and time.  Skin: Skin is warm and dry.  Psychiatric: She has a normal mood and affect. Her behavior is normal. Thought content normal.  Nursing note and vitals reviewed.     Assessment & Plan:  1: Chronic heart failure with preserved ejection fraction-   - NYHA class II - euvolemic today  - weighing daily; Reminded to call for an overnight weight gain of >2 pounds or a weekly weight gain of >5 pounds - weight down 8 pounds since 08/17/17 - says that she's eating until she feels full and then stops - maintaining a 32 ounce fluid restriction per nephrology  - wearing oxygent at 2L at bedtime and during dialysis - saw cardiologist Rockey Situ) 01/14/17 - not adding salt and is trying to eat low sodium foods. Reminded about the importance of following a 2000mg  sodium diet. She says that she's being diligent about reading food  labels now  - says that she's no longer eating cookies, chips, doughnuts etc - BNP on 09/06/17 was 1148.0 - PharmD reconciled medications with the patient  2: HTN-  - BP looks great today - has had her hydralazine and losartan increased - BMP on 09/07/17 reviewed and showed sodium 137, potassium 5.3 and GFR 3  3.ESRD on dialysis-  - dialysis on T, TH, Sat - on the transplant list and has information to sign up for a living donor -  has a behavioral health appointment at [facility]; reports having good support system at home for when she does get her transplant  4: Lymphedema- - stage 2 - has worn compression socks but says that they "cut into" her legs and made her legs hurt - does elevate her legs numerous times during the day - has been measured for the lymphapress compression boots - due to dialysis 3 days/week, patient is unable to exercise consistently. When she does walk for exercise, her edema continues.  Patient did not bring her medications nor a list. Each medication was verbally reviewed with the patient and she was encouraged to bring the bottles to every visit to confirm accuracy of list.  Return in 2 months or sooner for any questions/problems before then.

## 2017-09-16 ENCOUNTER — Other Ambulatory Visit
Admission: RE | Admit: 2017-09-16 | Discharge: 2017-09-16 | Disposition: A | Discharge status: Home / Self Care | Payer: Medicare Other | Source: Ambulatory Visit | Attending: Nephrology | Admitting: Nephrology

## 2017-09-16 DIAGNOSIS — N186 End stage renal disease: Secondary | ICD-10-CM | POA: Diagnosis present

## 2017-09-16 LAB — HEMATOCRIT: HCT: 25.6 % — ABNORMAL LOW (ref 35.0–47.0)

## 2017-09-16 LAB — HEMOGLOBIN: HEMOGLOBIN: 8.2 g/dL — AB (ref 12.0–16.0)

## 2017-09-17 ENCOUNTER — Ambulatory Visit: Payer: Medicare Other | Attending: Family | Admitting: Family

## 2017-09-17 ENCOUNTER — Encounter: Payer: Self-pay | Admitting: Family

## 2017-09-17 VITALS — BP 139/86 | HR 70 | Resp 18 | Ht 63.0 in | Wt 177.5 lb

## 2017-09-17 DIAGNOSIS — Z7682 Awaiting organ transplant status: Secondary | ICD-10-CM | POA: Insufficient documentation

## 2017-09-17 DIAGNOSIS — Z888 Allergy status to other drugs, medicaments and biological substances status: Secondary | ICD-10-CM | POA: Diagnosis not present

## 2017-09-17 DIAGNOSIS — Z885 Allergy status to narcotic agent status: Secondary | ICD-10-CM | POA: Insufficient documentation

## 2017-09-17 DIAGNOSIS — I5032 Chronic diastolic (congestive) heart failure: Secondary | ICD-10-CM | POA: Insufficient documentation

## 2017-09-17 DIAGNOSIS — Z79899 Other long term (current) drug therapy: Secondary | ICD-10-CM | POA: Diagnosis not present

## 2017-09-17 DIAGNOSIS — Z8249 Family history of ischemic heart disease and other diseases of the circulatory system: Secondary | ICD-10-CM | POA: Diagnosis not present

## 2017-09-17 DIAGNOSIS — E1122 Type 2 diabetes mellitus with diabetic chronic kidney disease: Secondary | ICD-10-CM | POA: Insufficient documentation

## 2017-09-17 DIAGNOSIS — Q613 Polycystic kidney, unspecified: Secondary | ICD-10-CM | POA: Insufficient documentation

## 2017-09-17 DIAGNOSIS — I509 Heart failure, unspecified: Secondary | ICD-10-CM | POA: Diagnosis present

## 2017-09-17 DIAGNOSIS — Z992 Dependence on renal dialysis: Secondary | ICD-10-CM | POA: Diagnosis not present

## 2017-09-17 DIAGNOSIS — E1140 Type 2 diabetes mellitus with diabetic neuropathy, unspecified: Secondary | ICD-10-CM | POA: Diagnosis not present

## 2017-09-17 DIAGNOSIS — Z7982 Long term (current) use of aspirin: Secondary | ICD-10-CM | POA: Insufficient documentation

## 2017-09-17 DIAGNOSIS — I890 Lymphedema, not elsewhere classified: Secondary | ICD-10-CM | POA: Diagnosis not present

## 2017-09-17 DIAGNOSIS — I132 Hypertensive heart and chronic kidney disease with heart failure and with stage 5 chronic kidney disease, or end stage renal disease: Secondary | ICD-10-CM | POA: Insufficient documentation

## 2017-09-17 DIAGNOSIS — J449 Chronic obstructive pulmonary disease, unspecified: Secondary | ICD-10-CM | POA: Diagnosis not present

## 2017-09-17 DIAGNOSIS — I10 Essential (primary) hypertension: Secondary | ICD-10-CM

## 2017-09-17 DIAGNOSIS — Z87891 Personal history of nicotine dependence: Secondary | ICD-10-CM | POA: Diagnosis not present

## 2017-09-17 DIAGNOSIS — N186 End stage renal disease: Secondary | ICD-10-CM | POA: Insufficient documentation

## 2017-09-17 DIAGNOSIS — Z01818 Encounter for other preprocedural examination: Secondary | ICD-10-CM

## 2017-09-17 NOTE — Patient Instructions (Signed)
Continue weighing daily and call for an overnight weight gain of > 2 pounds or a weekly weight gain of >5 pounds. 

## 2017-09-21 ENCOUNTER — Inpatient Hospital Stay
Admission: EM | Admit: 2017-09-21 | Discharge: 2017-09-22 | DRG: 291 | Disposition: A | Discharge status: Home / Self Care | Payer: Medicare Other | Attending: Internal Medicine | Admitting: Internal Medicine

## 2017-09-21 ENCOUNTER — Emergency Department: Payer: Medicare Other

## 2017-09-21 ENCOUNTER — Other Ambulatory Visit: Payer: Self-pay

## 2017-09-21 ENCOUNTER — Inpatient Hospital Stay (HOSPITAL_COMMUNITY)
Admit: 2017-09-21 | Discharge: 2017-09-21 | Disposition: A | Discharge status: Home / Self Care | Payer: Medicare Other | Attending: Internal Medicine | Admitting: Internal Medicine

## 2017-09-21 ENCOUNTER — Encounter: Payer: Self-pay | Admitting: *Deleted

## 2017-09-21 DIAGNOSIS — I509 Heart failure, unspecified: Secondary | ICD-10-CM

## 2017-09-21 DIAGNOSIS — I5033 Acute on chronic diastolic (congestive) heart failure: Secondary | ICD-10-CM | POA: Diagnosis present

## 2017-09-21 DIAGNOSIS — J441 Chronic obstructive pulmonary disease with (acute) exacerbation: Secondary | ICD-10-CM | POA: Diagnosis present

## 2017-09-21 DIAGNOSIS — Z885 Allergy status to narcotic agent status: Secondary | ICD-10-CM

## 2017-09-21 DIAGNOSIS — E1122 Type 2 diabetes mellitus with diabetic chronic kidney disease: Secondary | ICD-10-CM | POA: Diagnosis present

## 2017-09-21 DIAGNOSIS — Q613 Polycystic kidney, unspecified: Secondary | ICD-10-CM | POA: Diagnosis not present

## 2017-09-21 DIAGNOSIS — I132 Hypertensive heart and chronic kidney disease with heart failure and with stage 5 chronic kidney disease, or end stage renal disease: Principal | ICD-10-CM | POA: Diagnosis present

## 2017-09-21 DIAGNOSIS — R0600 Dyspnea, unspecified: Secondary | ICD-10-CM | POA: Diagnosis not present

## 2017-09-21 DIAGNOSIS — N186 End stage renal disease: Secondary | ICD-10-CM | POA: Diagnosis present

## 2017-09-21 DIAGNOSIS — I953 Hypotension of hemodialysis: Secondary | ICD-10-CM | POA: Diagnosis present

## 2017-09-21 DIAGNOSIS — Z7982 Long term (current) use of aspirin: Secondary | ICD-10-CM | POA: Diagnosis not present

## 2017-09-21 DIAGNOSIS — Z87891 Personal history of nicotine dependence: Secondary | ICD-10-CM

## 2017-09-21 DIAGNOSIS — E1142 Type 2 diabetes mellitus with diabetic polyneuropathy: Secondary | ICD-10-CM | POA: Diagnosis present

## 2017-09-21 DIAGNOSIS — K219 Gastro-esophageal reflux disease without esophagitis: Secondary | ICD-10-CM | POA: Diagnosis present

## 2017-09-21 DIAGNOSIS — Z992 Dependence on renal dialysis: Secondary | ICD-10-CM | POA: Diagnosis not present

## 2017-09-21 DIAGNOSIS — I5021 Acute systolic (congestive) heart failure: Secondary | ICD-10-CM | POA: Diagnosis not present

## 2017-09-21 DIAGNOSIS — Z888 Allergy status to other drugs, medicaments and biological substances status: Secondary | ICD-10-CM

## 2017-09-21 DIAGNOSIS — N2581 Secondary hyperparathyroidism of renal origin: Secondary | ICD-10-CM | POA: Diagnosis present

## 2017-09-21 DIAGNOSIS — Z9119 Patient's noncompliance with other medical treatment and regimen: Secondary | ICD-10-CM | POA: Diagnosis not present

## 2017-09-21 DIAGNOSIS — J9621 Acute and chronic respiratory failure with hypoxia: Secondary | ICD-10-CM | POA: Diagnosis present

## 2017-09-21 DIAGNOSIS — Z91018 Allergy to other foods: Secondary | ICD-10-CM

## 2017-09-21 DIAGNOSIS — J9601 Acute respiratory failure with hypoxia: Secondary | ICD-10-CM

## 2017-09-21 DIAGNOSIS — Z79899 Other long term (current) drug therapy: Secondary | ICD-10-CM

## 2017-09-21 DIAGNOSIS — D631 Anemia in chronic kidney disease: Secondary | ICD-10-CM | POA: Diagnosis present

## 2017-09-21 DIAGNOSIS — R748 Abnormal levels of other serum enzymes: Secondary | ICD-10-CM | POA: Diagnosis present

## 2017-09-21 DIAGNOSIS — E875 Hyperkalemia: Secondary | ICD-10-CM | POA: Diagnosis present

## 2017-09-21 DIAGNOSIS — J810 Acute pulmonary edema: Secondary | ICD-10-CM

## 2017-09-21 LAB — BASIC METABOLIC PANEL
Anion gap: 14 (ref 5–15)
BUN: 56 mg/dL — ABNORMAL HIGH (ref 6–20)
CO2: 25 mmol/L (ref 22–32)
CREATININE: 11.18 mg/dL — AB (ref 0.44–1.00)
Calcium: 8.7 mg/dL — ABNORMAL LOW (ref 8.9–10.3)
Chloride: 100 mmol/L — ABNORMAL LOW (ref 101–111)
GFR calc non Af Amer: 3 mL/min — ABNORMAL LOW (ref >60)
GFR, EST AFRICAN AMERICAN: 4 mL/min — AB (ref >60)
Glucose, Bld: 116 mg/dL — ABNORMAL HIGH (ref 65–99)
Potassium: 4.9 mmol/L (ref 3.5–5.1)
Sodium: 139 mmol/L (ref 135–145)

## 2017-09-21 LAB — GLUCOSE, CAPILLARY
GLUCOSE-CAPILLARY: 77 mg/dL (ref 65–99)
Glucose-Capillary: 82 mg/dL (ref 65–99)
Glucose-Capillary: 118 mg/dL — ABNORMAL HIGH (ref 65–99)
Glucose-Capillary: 111 mg/dL — ABNORMAL HIGH (ref 65–99)

## 2017-09-21 LAB — CBC WITH DIFFERENTIAL/PLATELET
Basophils Absolute: 0.1 10*3/uL (ref 0–0.1)
Basophils Relative: 1 %
Eosinophils Absolute: 0.5 10*3/uL (ref 0–0.7)
Eosinophils Relative: 4 %
HEMATOCRIT: 24.6 % — AB (ref 35.0–47.0)
HEMOGLOBIN: 7.6 g/dL — AB (ref 12.0–16.0)
LYMPHS ABS: 1 10*3/uL (ref 1.0–3.6)
Lymphocytes Relative: 9 %
MCH: 27 pg (ref 26.0–34.0)
MCHC: 31.1 g/dL — AB (ref 32.0–36.0)
MCV: 86.7 fL (ref 80.0–100.0)
MONOS PCT: 4 %
Monocytes Absolute: 0.5 10*3/uL (ref 0.2–0.9)
NEUTROS ABS: 9 10*3/uL — AB (ref 1.4–6.5)
NEUTROS PCT: 82 %
Platelets: 304 10*3/uL (ref 150–440)
RBC: 2.83 MIL/uL — ABNORMAL LOW (ref 3.80–5.20)
RDW: 22.9 % — ABNORMAL HIGH (ref 11.5–14.5)
WBC: 11.1 10*3/uL — ABNORMAL HIGH (ref 3.6–11.0)

## 2017-09-21 LAB — ECHOCARDIOGRAM COMPLETE
HEIGHTINCHES: 63 in
WEIGHTICAEL: 2857.16 [oz_av]

## 2017-09-21 LAB — BRAIN NATRIURETIC PEPTIDE: B Natriuretic Peptide: 1122 pg/mL — ABNORMAL HIGH (ref 0.0–100.0)

## 2017-09-21 LAB — HEMOGLOBIN A1C
Hgb A1c MFr Bld: 5 % (ref 4.8–5.6)
Mean Plasma Glucose: 96.8 mg/dL

## 2017-09-21 LAB — PHOSPHORUS: PHOSPHORUS: 2.9 mg/dL (ref 2.5–4.6)

## 2017-09-21 LAB — TROPONIN I: TROPONIN I: 0.03 ng/mL — AB (ref <0.03)

## 2017-09-21 MED ORDER — OXYCODONE-ACETAMINOPHEN 10-325 MG PO TABS: 1.0000 | ORAL_TABLET | Freq: Every day | ORAL | Status: DC | PRN

## 2017-09-21 MED ORDER — IPRATROPIUM-ALBUTEROL 0.5-2.5 (3) MG/3ML IN SOLN
3.0000 mL | Freq: Four times a day (QID) | RESPIRATORY_TRACT | Status: DC
  Administered 2017-09-21 – 2017-09-22 (×5): 3 mL via RESPIRATORY_TRACT
  Filled 2017-09-21 (×5): qty 3

## 2017-09-21 MED ORDER — ASPIRIN 81 MG PO CHEW
81.0000 mg | CHEWABLE_TABLET | Freq: Every day | ORAL | Status: DC
  Administered 2017-09-21 – 2017-09-22 (×2): 81 mg via ORAL
  Filled 2017-09-21 (×2): qty 1

## 2017-09-21 MED ORDER — LOSARTAN POTASSIUM 50 MG PO TABS
100.0000 mg | ORAL_TABLET | Freq: Every day | ORAL | Status: DC
  Administered 2017-09-21 – 2017-09-22 (×2): 100 mg via ORAL
  Filled 2017-09-21 (×2): qty 2

## 2017-09-21 MED ORDER — INSULIN ASPART 100 UNIT/ML ~~LOC~~ SOLN: 0.0000 [IU] | SUBCUTANEOUS | Status: DC

## 2017-09-21 MED ORDER — METOCLOPRAMIDE HCL 5 MG PO TABS
5.0000 mg | ORAL_TABLET | Freq: Two times a day (BID) | ORAL | Status: DC | PRN
  Filled 2017-09-21: qty 1

## 2017-09-21 MED ORDER — SODIUM CHLORIDE 0.9 % IV SOLN: 100.0000 mL | INTRAVENOUS | Status: DC | PRN

## 2017-09-21 MED ORDER — HYDRALAZINE HCL 20 MG/ML IJ SOLN: 10.0000 mg | Freq: Four times a day (QID) | INTRAMUSCULAR | Status: DC | PRN

## 2017-09-21 MED ORDER — LIDOCAINE HCL (PF) 1 % IJ SOLN
5.0000 mL | INTRAMUSCULAR | Status: DC | PRN
  Filled 2017-09-21: qty 5

## 2017-09-21 MED ORDER — OXYCODONE-ACETAMINOPHEN 5-325 MG PO TABS
1.0000 | ORAL_TABLET | Freq: Every day | ORAL | Status: DC | PRN
  Administered 2017-09-22: 1 via ORAL
  Filled 2017-09-21: qty 1

## 2017-09-21 MED ORDER — AMITRIPTYLINE HCL 10 MG PO TABS
10.0000 mg | ORAL_TABLET | Freq: Every evening | ORAL | Status: DC | PRN
  Filled 2017-09-21: qty 1

## 2017-09-21 MED ORDER — FUROSEMIDE 20 MG PO TABS
80.0000 mg | ORAL_TABLET | Freq: Every day | ORAL | Status: DC
  Administered 2017-09-22: 80 mg via ORAL
  Filled 2017-09-21: qty 4

## 2017-09-21 MED ORDER — PENTAFLUOROPROP-TETRAFLUOROETH EX AERO: 1.0000 "application " | INHALATION_SPRAY | CUTANEOUS | Status: DC | PRN

## 2017-09-21 MED ORDER — CINACALCET HCL 30 MG PO TABS
30.0000 mg | ORAL_TABLET | Freq: Every day | ORAL | Status: DC
  Administered 2017-09-21 – 2017-09-22 (×2): 30 mg via ORAL
  Filled 2017-09-21 (×2): qty 1

## 2017-09-21 MED ORDER — HEPARIN SODIUM (PORCINE) 1000 UNIT/ML DIALYSIS
1000.0000 [IU] | INTRAMUSCULAR | Status: DC | PRN
  Filled 2017-09-21: qty 1

## 2017-09-21 MED ORDER — ACETAMINOPHEN 325 MG PO TABS
650.0000 mg | ORAL_TABLET | Freq: Four times a day (QID) | ORAL | Status: DC | PRN
  Administered 2017-09-21: 650 mg via ORAL
  Filled 2017-09-21: qty 2

## 2017-09-21 MED ORDER — PANTOPRAZOLE SODIUM 40 MG PO TBEC
40.0000 mg | DELAYED_RELEASE_TABLET | Freq: Every day | ORAL | Status: DC
  Administered 2017-09-21 – 2017-09-22 (×2): 40 mg via ORAL
  Filled 2017-09-21 (×2): qty 1

## 2017-09-21 MED ORDER — HYDRALAZINE HCL 50 MG PO TABS
100.0000 mg | ORAL_TABLET | Freq: Three times a day (TID) | ORAL | Status: DC
  Administered 2017-09-21 – 2017-09-22 (×3): 100 mg via ORAL
  Filled 2017-09-21 (×3): qty 2

## 2017-09-21 MED ORDER — ONDANSETRON HCL 4 MG PO TABS: 4.0000 mg | ORAL_TABLET | Freq: Four times a day (QID) | ORAL | Status: DC | PRN

## 2017-09-21 MED ORDER — AMLODIPINE BESYLATE 5 MG PO TABS
5.0000 mg | ORAL_TABLET | Freq: Every day | ORAL | Status: DC
  Administered 2017-09-22: 5 mg via ORAL
  Filled 2017-09-21: qty 1

## 2017-09-21 MED ORDER — FUROSEMIDE 10 MG/ML IJ SOLN
80.0000 mg | Freq: Once | INTRAMUSCULAR | Status: AC
  Administered 2017-09-21: 80 mg via INTRAVENOUS

## 2017-09-21 MED ORDER — ALBUTEROL SULFATE (2.5 MG/3ML) 0.083% IN NEBU: 2.5000 mg | INHALATION_SOLUTION | RESPIRATORY_TRACT | Status: DC | PRN

## 2017-09-21 MED ORDER — POLYETHYLENE GLYCOL 3350 17 G PO PACK: 17.0000 g | PACK | Freq: Every day | ORAL | Status: DC | PRN

## 2017-09-21 MED ORDER — ALTEPLASE 2 MG IJ SOLR
2.0000 mg | Freq: Once | INTRAMUSCULAR | Status: DC | PRN
  Filled 2017-09-21: qty 2

## 2017-09-21 MED ORDER — NITROGLYCERIN 0.4 MG SL SUBL: 0.4000 mg | SUBLINGUAL_TABLET | SUBLINGUAL | Status: DC | PRN

## 2017-09-21 MED ORDER — METOPROLOL SUCCINATE ER 50 MG PO TB24
50.0000 mg | ORAL_TABLET | Freq: Every day | ORAL | Status: DC
  Administered 2017-09-22: 50 mg via ORAL
  Filled 2017-09-21: qty 1

## 2017-09-21 MED ORDER — GABAPENTIN 300 MG PO CAPS
300.0000 mg | ORAL_CAPSULE | Freq: Two times a day (BID) | ORAL | Status: DC
  Administered 2017-09-21 – 2017-09-22 (×3): 300 mg via ORAL
  Filled 2017-09-21 (×4): qty 1

## 2017-09-21 MED ORDER — ONDANSETRON HCL 4 MG/2ML IJ SOLN: 4.0000 mg | Freq: Four times a day (QID) | INTRAMUSCULAR | Status: DC | PRN

## 2017-09-21 MED ORDER — FUROSEMIDE 10 MG/ML IJ SOLN
INTRAMUSCULAR | Status: AC
  Administered 2017-09-21: 80 mg via INTRAVENOUS
  Filled 2017-09-21: qty 10

## 2017-09-21 MED ORDER — HEPARIN SODIUM (PORCINE) 5000 UNIT/ML IJ SOLN
5000.0000 [IU] | Freq: Three times a day (TID) | INTRAMUSCULAR | Status: DC
  Administered 2017-09-21 – 2017-09-22 (×3): 5000 [IU] via SUBCUTANEOUS
  Filled 2017-09-21 (×3): qty 1

## 2017-09-21 MED ORDER — CALCIUM ACETATE (PHOS BINDER) 667 MG PO CAPS
667.0000 mg | ORAL_CAPSULE | Freq: Three times a day (TID) | ORAL | Status: DC
  Administered 2017-09-21 – 2017-09-22 (×4): 667 mg via ORAL
  Filled 2017-09-21 (×5): qty 1

## 2017-09-21 MED ORDER — ACETAMINOPHEN 650 MG RE SUPP: 650.0000 mg | Freq: Four times a day (QID) | RECTAL | Status: DC | PRN

## 2017-09-21 MED ORDER — LIDOCAINE-PRILOCAINE 2.5-2.5 % EX CREA: 1.0000 "application " | TOPICAL_CREAM | CUTANEOUS | Status: DC | PRN

## 2017-09-21 MED ORDER — OXYCODONE HCL 5 MG PO TABS
5.0000 mg | ORAL_TABLET | Freq: Every day | ORAL | Status: DC | PRN
  Administered 2017-09-22: 5 mg via ORAL
  Filled 2017-09-21: qty 1

## 2017-09-21 MED ORDER — CLONIDINE HCL 0.1 MG PO TABS
0.1000 mg | ORAL_TABLET | Freq: Two times a day (BID) | ORAL | Status: DC
  Administered 2017-09-21: 0.1 mg via ORAL
  Filled 2017-09-21: qty 1

## 2017-09-21 NOTE — Progress Notes (Signed)
Pt was transported to CCU from the ED while on the bipap. 

## 2017-09-21 NOTE — Progress Notes (Signed)
Patient on dialysis, floor RN will put in IV consult when its done.

## 2017-09-21 NOTE — Progress Notes (Signed)
Pt tolerating off bipap very well, placed on 2lpm [place] sats 98%, respiratory rate 18/min, will continue to monitor

## 2017-09-21 NOTE — Progress Notes (Signed)
Post hd 

## 2017-09-21 NOTE — Progress Notes (Signed)
*  PRELIMINARY RESULTS* Echocardiogram 2D Echocardiogram has been performed.  Jasmine Holloway 09/21/2017, 2:33 PM

## 2017-09-21 NOTE — H&P (Signed)
Jasmine Holloway at [place] NAME: Jasmine Holloway    MR#:  [id number]  DATE OF BIRTH:  09-Sep-1960  DATE OF ADMISSION:  09/21/2017  PRIMARY CARE PHYSICIAN: Theotis Burrow, MD   REQUESTING/REFERRING PHYSICIAN: Dr. Owens Shark  CHIEF COMPLAINT:   Chief Complaint  Patient presents with  . Shortness of Breath  . Respiratory Distress    HISTORY OF PRESENT ILLNESS:  Jasmine Holloway  is a 57 y.o. female with a known history of hypertension, diabetes, diastolic CHF, end-stage renal disease on Tuesday, Thursday, Saturday schedule presents to the emergency room from her dialysis center due to acutely worsening shortness of breath, cough and wheezing.  Patient due to increased work of breathing and desaturations placed on BiPAP.  Chest x-ray shows severe pulmonary edema.  Patient mentions that during her last dialysis session less fluid was removed due to hypotension.  She has tried to restrict fluid but continues to gain weight.  According to the patient she is 3 pounds over her normal weight.  Presently she is in respiratory distress and is being admitted to stepdown unit for respiratory failure and emergent hemodialysis.Marland Kitchen  PAST MEDICAL HISTORY:   Past Medical History:  Diagnosis Date  . Asthma   . CHF (congestive heart failure) ([place])   . COPD (chronic obstructive pulmonary disease) ([place])   . Diabetes mellitus without complication ([place])   . Diastolic heart failure ([place])   . ESRD (end stage renal disease) on dialysis ([place])   . Hypertension   . Neuropathy   . Polycystic kidney disease   . Renal insufficiency     PAST SURGICAL HISTORY:   Past Surgical History:  Procedure Laterality Date  . ARTERIOVENOUS GRAFT PLACEMENT Right    x3 (R forearm currently used for access)  . BREAST BIOPSY Left 12/03/2015   neg  . COLON SURGERY    . ESOPHAGOGASTRODUODENOSCOPY (EGD) WITH PROPOFOL N/A 02/04/2017   Procedure: ESOPHAGOGASTRODUODENOSCOPY (EGD) WITH PROPOFOL;   Surgeon: Jonathon Bellows, MD;  Location[facility] ENDOSCOPY;  Service: Endoscopy;  Laterality: N/A;  . PERIPHERAL VASCULAR CATHETERIZATION Right 08/04/2016   Procedure: A/V Shuntogram;  Surgeon: Algernon Huxley, MD;  Location: [place] CV LAB;  Service: Cardiovascular;  Laterality: Right;  . VEIN HARVEST      SOCIAL HISTORY:   Social History   Tobacco Use  . Smoking status: Former Smoker    Packs/day: 0.25    Last attempt to quit: 08/11/2015    Years since quitting: 2.1  . Smokeless tobacco: Never Used  Substance Use Topics  . Alcohol use: No    Alcohol/week: 0.0 oz    FAMILY HISTORY:   Family History  Problem Relation Age of Onset  . Hypertension Brother   . Heart failure Brother   . Hypertension Mother   . Heart disease Mother     DRUG ALLERGIES:   Allergies  Allergen Reactions  . Morphine And Related Nausea And Vomiting  . Strawberry Extract   . Tramadol Nausea And Vomiting  . Venofer [Iron Sucrose]   . Buprenorphine Hcl Nausea And Vomiting  . Morphine Nausea And Vomiting    Halllucinations    REVIEW OF SYSTEMS:   Review of Systems  Constitutional: Positive for malaise/fatigue. Negative for chills and fever.  HENT: Negative for sore throat.   Eyes: Negative for blurred vision, double vision and pain.  Respiratory: Positive for cough, shortness of breath and wheezing. Negative for hemoptysis.   Cardiovascular: Positive for leg swelling. Negative for  chest pain, palpitations and orthopnea.  Gastrointestinal: Negative for abdominal pain, constipation, diarrhea, heartburn, nausea and vomiting.  Genitourinary: Negative for dysuria and hematuria.  Musculoskeletal: Negative for back pain and joint pain.  Skin: Negative for rash.  Neurological: Positive for weakness. Negative for sensory change, speech change, focal weakness and headaches.  Endo/Heme/Allergies: Does not bruise/bleed easily.  Psychiatric/Behavioral: Negative for depression. The patient is not  nervous/anxious.     MEDICATIONS AT HOME:   Prior to Admission medications   Medication Sig Start Date End Date Taking? Authorizing Provider  acetaminophen (TYLENOL) 325 MG tablet Take 2 tablets (650 mg total) by mouth every 6 (six) hours as needed for mild pain or headache. Patient taking differently: Take 325 mg every 6 (six) hours as needed by mouth for mild pain or headache.  03/18/16  Yes Gouru, Aruna, MD  amitriptyline (ELAVIL) 10 MG tablet Take 10 mg by mouth at bedtime as needed for sleep.   Yes [provider]  amLODipine (NORVASC) 5 MG tablet Take 5 mg daily by mouth.  08/31/14  Yes [provider]  aspirin 81 MG chewable tablet Chew 1 tablet (81 mg total) by mouth daily. 01/17/16  Yes Gladstone Lighter, MD  b complex-vitamin c-folic acid (NEPHRO-VITE) 0.8 MG TABS tablet Take 1 tablet by mouth daily.    Yes [provider]  calcium acetate (PHOSLO) 667 MG capsule Take 1 capsule (667 mg total) by mouth 3 (three) times daily with meals. Patient taking differently: Take 1,334 mg by mouth 3 (three) times daily with meals.  02/02/17  Yes Wieting, Richard, MD  cloNIDine (CATAPRES) 0.1 MG tablet Take 1 tablet (0.1 mg total) by mouth 2 (two) times daily. 01/17/16  Yes Gladstone Lighter, MD  diphenhydrAMINE (BENADRYL) 25 mg capsule Take 2 capsules (50 mg total) by mouth every 6 (six) hours as needed. 10/16/16  Yes Carrie Mew, MD  epoetin alfa (EPOGEN,PROCRIT) 35597 UNIT/ML injection 10,000 Units 3 (three) times a week.   Yes [provider]  furosemide (LASIX) 80 MG tablet Take 1 tablet (80 mg total) by mouth daily. 03/05/16 11/10/17 Yes Hackney, Otila Kluver A, FNP  gabapentin (NEURONTIN) 300 MG capsule Take 300 mg 2 (two) times daily by mouth.    Yes [provider]  hydrALAZINE (APRESOLINE) 100 MG tablet Take 1 tablet (100 mg total) by mouth 3 (three) times daily. 09/07/17  Yes Mody, Sital, MD  lidocaine (XYLOCAINE) 5 % ointment Apply topically.   Yes  [provider]  losartan (COZAAR) 100 MG tablet Take 1 tablet (100 mg total) by mouth daily. 09/07/17  Yes Mody, Ulice Bold, MD  metoCLOPramide (REGLAN) 5 MG tablet Take 1 tablet (5 mg total) by mouth 2 (two) times daily as needed (nausea, vomiting or headache). 08/05/17 08/05/18 Yes Earleen Newport, MD  metoprolol succinate (TOPROL-XL) 50 MG 24 hr tablet Take 50 mg daily by mouth.    Yes [provider]  nitroGLYCERIN (NITROSTAT) 0.4 MG SL tablet Place 1 tablet (0.4 mg total) under the tongue every 5 (five) minutes as needed for chest pain. 01/14/17 09/17/18 Yes Gollan, Kathlene November, MD  omeprazole (PRILOSEC) 20 MG capsule Take 1 capsule by mouth daily. 08/06/17  Yes [provider]  ondansetron (ZOFRAN) 4 MG tablet Take 1 tablet (4 mg total) by mouth every 8 (eight) hours as needed for nausea or vomiting. 07/21/16  Yes Daymon Larsen, MD  oxyCODONE-acetaminophen (PERCOCET) 10-325 MG tablet Take 1 tablet by mouth daily as needed for pain.   Yes  [provider]  prochlorperazine (COMPAZINE) 10 MG tablet Take 1 tablet (10 mg total) by mouth every 6 (six) hours as needed for nausea. 10/15/16  Yes Paulette Blanch, MD  SENSIPAR 30 MG tablet Take 1 tablet (30 mg total) by mouth daily. 07/07/16  Yes Fritzi Mandes, MD  VENTOLIN HFA 108 (90 Base) MCG/ACT inhaler Inhale 2 puffs into the lungs daily as needed.  05/21/16  Yes [provider]     VITAL SIGNS:  Blood pressure (!) 155/101, pulse 65, temperature 97.9 F (36.6 C), temperature source Axillary, resp. rate 14, height 5\' 3"  (1.6 m), weight 80.3 kg (177 lb), SpO2 100 %.  PHYSICAL EXAMINATION:  Physical Exam  GENERAL:  57 y.o.-year-old patient lying in the bed , in respiratory distress. EYES: Pupils equal, round, reactive to light and accommodation. No scleral icterus. Extraocular muscles intact.  HEENT: Head atraumatic, normocephalic. Oropharynx and nasopharynx clear. No oropharyngeal erythema, moist oral mucosa   NECK:  Supple, no jugular venous distention. No thyroid enlargement, no tenderness.  LUNGS: Bilateral coarse breath sounds with wheezing and decreased air entry CARDIOVASCULAR: S1, S2 normal. No murmurs, rubs, or gallops.  ABDOMEN: Soft, nontender, nondistended. Bowel sounds present. No organomegaly or mass.  EXTREMITIES: No  cyanosis, or clubbing. + 2 pedal & radial pulses b/l.  Lower extremity edema NEUROLOGIC: Cranial nerves II through XII are intact. No focal Motor or sensory deficits appreciated b/l PSYCHIATRIC: The patient is alert and oriented x 3. Good affect.  SKIN: No obvious rash, lesion, or ulcer.   LABORATORY PANEL:   CBC Recent Labs  Lab 09/21/17 0708  WBC 11.1*  HGB 7.6*  HCT 24.6*  PLT 304   ------------------------------------------------------------------------------------------------------------------  Chemistries  Recent Labs  Lab 09/21/17 0708  NA 139  K 4.9  CL 100*  CO2 25  GLUCOSE 116*  BUN 56*  CREATININE 11.18*  CALCIUM 8.7*   ------------------------------------------------------------------------------------------------------------------  Cardiac Enzymes Recent Labs  Lab 09/21/17 0708  TROPONINI 0.03*   ------------------------------------------------------------------------------------------------------------------  RADIOLOGY:  Dg Chest Port 1 View  Result Date: 09/21/2017 CLINICAL DATA:  Difficulty breathing again during dialysis. History of CHF, asthma-COPD, former smoker. EXAM: PORTABLE CHEST 1 VIEW COMPARISON:  PA and lateral chest x-ray of September 06, 2017 FINDINGS: The lungs are well-expanded. The interstitial markings are increased diffusely. The pulmonary vascularity is engorged and the cardiac silhouette is enlarged. A small amount of pleural fluid is suspected in the costophrenic gutters. IMPRESSION: CHF with moderate pulmonary interstitial edema. No alveolar pneumonia. Electronically Signed   By: David  Martinique M.D.   On:  09/21/2017 07:43     IMPRESSION AND PLAN:   *Acute on chronic diastolic congestive heart failure in setting of end-stage renal disease with acute on chronic hypoxic respiratory failure  as per patient decreased amount of fluid removed with dialysis due to hypotension during her last session. Discussed with Dr. Holley Raring.  Patient will need urgent hemodialysis.  Critically ill.  On BiPAP. Admit to stepdown unit.  Discussed with Dr. Mortimer Fries of ICU.  *Hypertension.  Continue home medications.  Will give these after hemodialysis.  Added IV hydralazine.  *Diabetes mellitus.  Sliding scale insulin with every 4 hours Accu-Chek while patient is n.p.o.  *Acute COPD exacerbation.  Patient has significant amount of wheezing.  At this time will start steroids with breathing treatments.  Can stop steroids if wheezing resolves with removal of fluid.  *DVT prophylaxis with heparin   All the records are reviewed and case discussed with ED provider. Management  plans discussed with the patient, family and they are in agreement.  CODE STATUS: FULL CODE  TOTAL CC TIME TAKING CARE OF THIS PATIENT: 40 minutes.   Neita Carp M.D on 09/21/2017 at 8:24 AM  Between 7am to 6pm - Pager - [phone]  After 6pm go to [email or web address] - password [email or web address] [facility]  Office  [phone]  CC: Primary care physician; Theotis Burrow, MD  Note: This dictation was prepared with Dragon dictation along with smaller phrase technology. Any transcriptional errors that result from this process are unintentional.

## 2017-09-21 NOTE — Progress Notes (Signed)
HD initiated via R AVG without issue. No heparin. Patient remains on bipap. Currently sleeping. 3L goal as ordered. Continue to monitor.

## 2017-09-21 NOTE — Progress Notes (Signed)
Pre hd 

## 2017-09-21 NOTE — Progress Notes (Signed)
[facility]  ROUNDING NOTE   Subjective:  Patient presented with significant shortness of breath. She did undergo urgent dialysis earlier today. ultrafiltration achieved was 2.5 kg.   Objective:  Vital signs in last 24 hours:  Temp:  [97.9 F (36.6 C)-98.2 F (36.8 C)] 98.2 F (36.8 C) (02/12 1500) Pulse Rate:  [56-77] 75 (02/12 1700) Resp:  [14-27] 26 (02/12 1700) BP: (127-176)/(82-101) 127/83 (02/12 1700) SpO2:  [92 %-100 %] 93 % (02/12 1700) Weight:  [80.3 kg (177 lb)-83.4 kg (183 lb 13.8 oz)] 81 kg (178 lb 9.2 oz) (02/12 1326)  Weight change:  Filed Weights   09/21/17 0945 09/21/17 0950 09/21/17 1326  Weight: 83.4 kg (183 lb 13.8 oz) 83.4 kg (183 lb 13.8 oz) 81 kg (178 lb 9.2 oz)    Intake/Output: No intake/output data recorded.   Intake/Output this shift:  Total I/O In: -  Out: 2579 [Other:2579]  Physical Exam: General: Mild respiratory distress  Head: Normocephalic, atraumatic. Moist oral mucosal membranes  Eyes: Anicteric  Neck: Supple, trachea midline  Lungs:  Bilateral rales, normal effort  Heart: S1S2 no rubs  Abdomen:  Soft, nontender, bowel sounds present  Extremities: 2+ peripheral edema.  Neurologic: Awake, alert, following commands  Skin: No lesions  Access: RUE AVG    Basic Metabolic Panel: Recent Labs  Lab 09/21/17 0708 09/21/17 1009  NA 139  --   K 4.9  --   CL 100*  --   CO2 25  --   GLUCOSE 116*  --   BUN 56*  --   CREATININE 11.18*  --   CALCIUM 8.7*  --   PHOS  --  2.9    Liver Function Tests: No results for input(s): AST, ALT, ALKPHOS, BILITOT, PROT, ALBUMIN in the last 168 hours. No results for input(s): LIPASE, AMYLASE in the last 168 hours. No results for input(s): AMMONIA in the last 168 hours.  CBC: Recent Labs  Lab 09/16/17 0705 09/21/17 0708  WBC  --  11.1*  NEUTROABS  --  9.0*  HGB 8.2* 7.6*  HCT 25.6* 24.6*  MCV  --  86.7  PLT  --  304    Cardiac Enzymes: Recent Labs  Lab 09/21/17 0708   TROPONINI 0.03*    BNP: Invalid input(s): POCBNP  CBG: Recent Labs  Lab 09/21/17 0941 09/21/17 1151 09/21/17 1711  GLUCAP 82 71 118*    Microbiology: Results for orders placed or performed during the hospital encounter of 09/06/17  MRSA PCR Screening     Status: None   Collection Time: 09/07/17  2:40 AM  Result Value Ref Range Status   MRSA by PCR NEGATIVE NEGATIVE Final    Comment:        The GeneXpert MRSA Assay (FDA approved for NASAL specimens only), is one component of a comprehensive MRSA colonization surveillance program. It is not intended to diagnose MRSA infection nor to guide or monitor treatment for MRSA infections. Performed at [facility], [address] [place], [place] [address]     Coagulation Studies: No results for input(s): LABPROT, INR in the last 72 hours.  Urinalysis: No results for input(s): COLORURINE, LABSPEC, PHURINE, GLUCOSEU, HGBUR, BILIRUBINUR, KETONESUR, PROTEINUR, UROBILINOGEN, NITRITE, LEUKOCYTESUR in the last 72 hours.  Invalid input(s): APPERANCEUR    Imaging: Dg Chest Port 1 View  Result Date: 09/21/2017 CLINICAL DATA:  Difficulty breathing again during dialysis. History of CHF, asthma-COPD, former smoker. EXAM: PORTABLE CHEST 1 VIEW COMPARISON:  PA and lateral chest x-ray  of September 06, 2017 FINDINGS: The lungs are well-expanded. The interstitial markings are increased diffusely. The pulmonary vascularity is engorged and the cardiac silhouette is enlarged. A small amount of pleural fluid is suspected in the costophrenic gutters. IMPRESSION: CHF with moderate pulmonary interstitial edema. No alveolar pneumonia. Electronically Signed   By: David  Martinique M.D.   On: 09/21/2017 07:43     Medications:   . sodium chloride    . sodium chloride     . [START ON 09/22/2017] amLODipine  5 mg Oral Daily  . aspirin  81 mg Oral Daily  . calcium acetate  667 mg Oral TID WC  . cinacalcet  30 mg Oral Daily  . cloNIDine  0.1  mg Oral BID  . [START ON 09/22/2017] furosemide  80 mg Oral Daily  . gabapentin  300 mg Oral BID  . heparin  5,000 Units Subcutaneous Q8H  . hydrALAZINE  100 mg Oral TID  . insulin aspart  0-15 Units Subcutaneous Q4H  . ipratropium-albuterol  3 mL Nebulization Q6H  . losartan  100 mg Oral Daily  . [START ON 09/22/2017] metoprolol succinate  50 mg Oral Daily  . pantoprazole  40 mg Oral Daily   sodium chloride, sodium chloride, acetaminophen **OR** acetaminophen, albuterol, alteplase, amitriptyline, heparin, hydrALAZINE, lidocaine (PF), lidocaine-prilocaine, metoCLOPramide, nitroGLYCERIN, ondansetron **OR** ondansetron (ZOFRAN) IV, oxyCODONE-acetaminophen **AND** oxyCODONE, pentafluoroprop-tetrafluoroeth, polyethylene glycol  Assessment/ Plan:  57 y.o. female with end-stage renal disease secondary to polycystic kidney disease on hemodialysis, hypertension, GERD, diastolic congestive heart failure, peripheral neuropathy, COPD/asthma  TTS [facility] [address] Right AVG.  1.  ESRD on HD TTS.  Patient underwent urgent dialysis earlier today.  We will reassess the patient for additional dialysis tomorrow depending upon her volume status.  2.  Acute respiratory failure.  Patient was requiring BiPAP earlier in the day.  Continue to monitor her respiratory status.  3.  nemia chronic kidney disease.  Hemoglobin low at 7.6.  Continue to monitor.  Resume Epogen with next dialysis treatment.  4.  Secondary hyperparathyroidism.  Maintain the patient on calcium acetate as well as Sensipar.  Phosphorous currently 2.9.     LOS: 0 Sissi Padia 2/12/20195:20 PM

## 2017-09-21 NOTE — Progress Notes (Signed)
Pre hd. Patient arrived to ICU on bipap. AVG already accessed with 15g needles from outpatient center. Currently sats 100% on Bipap. Stable for HD

## 2017-09-21 NOTE — ED Triage Notes (Signed)
Per EMS called to dialysis center for pt with c/o difficulty breathing. On arrival pt extremely short of breath and unable to speak in complete sentences. Pt states she has not missed any treatments but has been feeling progressively worse since the weekend

## 2017-09-21 NOTE — Progress Notes (Signed)
HD completed. Unable to meet goal due to severe cramping x1. Total UF 2.5L. Patient currently on 3L [place] in no distress. Complains of mild headache. Report given to primary RN.

## 2017-09-21 NOTE — Consult Note (Signed)
PULMONARY / CRITICAL CARE MEDICINE   Name: Jasmine Holloway MRN: [id number] DOB: 25-May-1961    ADMISSION DATE:  09/21/2017 CONSULTATION DATE:  09/21/17  REFERRING MD:  Dr. Darvin Neighbours  CHIEF COMPLAINT:  Shortness of breath  HISTORY OF PRESENT ILLNESS:   62F with PMH as below significant for HFpEF, COPD, ESRD on HD presents with acutely worsening shortness of breath over the past couple of days. She states that at her last HD appointment, only 1 kg of fluid was removed, down from a more typical 2-3 kg. This was because of hypotension. Pt also endorses non-productive cough, orthopnea, PND, exertional dyspnea and LE edema. She denies fever, headache, and n/v/d.   In the ED, CXR with pulmonary edema, and BNP elevated at 1122. Pt was started on BiPap for respiratory failure and admitted to the ICU.  PAST MEDICAL HISTORY :  She  has a past medical history of Asthma, CHF (congestive heart failure) ([place]), COPD (chronic obstructive pulmonary disease) ([place]), Diabetes mellitus without complication ([place]), Diastolic heart failure ([place]), ESRD (end stage renal disease) on dialysis ([place]), Hypertension, Neuropathy, Polycystic kidney disease, and Renal insufficiency.  PAST SURGICAL HISTORY: She  has a past surgical history that includes Arteriovenous graft placement (Right); Vein harvest; Colon surgery; Cardiac catheterization (Right, 08/04/2016); Breast biopsy (Left, 12/03/2015); and Esophagogastroduodenoscopy (egd) with propofol (N/A, 02/04/2017).  Allergies  Allergen Reactions  . Morphine And Related Nausea And Vomiting  . Strawberry Extract   . Tramadol Nausea And Vomiting  . Venofer [Iron Sucrose]   . Buprenorphine Hcl Nausea And Vomiting  . Morphine Nausea And Vomiting    Halllucinations    No current facility-administered medications on file prior to encounter.    Current Outpatient Medications on File Prior to Encounter  Medication Sig  . acetaminophen (TYLENOL) 325 MG tablet Take 2 tablets (650 mg  total) by mouth every 6 (six) hours as needed for mild pain or headache. (Patient taking differently: Take 325 mg every 6 (six) hours as needed by mouth for mild pain or headache. )  . amitriptyline (ELAVIL) 10 MG tablet Take 10 mg by mouth at bedtime as needed for sleep.  Marland Kitchen amLODipine (NORVASC) 5 MG tablet Take 5 mg daily by mouth.   Marland Kitchen aspirin 81 MG chewable tablet Chew 1 tablet (81 mg total) by mouth daily.  Marland Kitchen b complex-vitamin c-folic acid (NEPHRO-VITE) 0.8 MG TABS tablet Take 1 tablet by mouth daily.   . calcium acetate (PHOSLO) 667 MG capsule Take 1 capsule (667 mg total) by mouth 3 (three) times daily with meals. (Patient taking differently: Take 1,334 mg by mouth 3 (three) times daily with meals. )  . cloNIDine (CATAPRES) 0.1 MG tablet Take 1 tablet (0.1 mg total) by mouth 2 (two) times daily.  . diphenhydrAMINE (BENADRYL) 25 mg capsule Take 2 capsules (50 mg total) by mouth every 6 (six) hours as needed.  Marland Kitchen epoetin alfa (EPOGEN,PROCRIT) 75643 UNIT/ML injection 10,000 Units 3 (three) times a week.  . furosemide (LASIX) 80 MG tablet Take 1 tablet (80 mg total) by mouth daily.  Marland Kitchen gabapentin (NEURONTIN) 300 MG capsule Take 300 mg 2 (two) times daily by mouth.   . hydrALAZINE (APRESOLINE) 100 MG tablet Take 1 tablet (100 mg total) by mouth 3 (three) times daily.  Marland Kitchen lidocaine (XYLOCAINE) 5 % ointment Apply topically.  Marland Kitchen losartan (COZAAR) 100 MG tablet Take 1 tablet (100 mg total) by mouth daily.  . metoCLOPramide (REGLAN) 5 MG tablet Take 1 tablet (5 mg total) by mouth 2 (  two) times daily as needed (nausea, vomiting or headache).  . metoprolol succinate (TOPROL-XL) 50 MG 24 hr tablet Take 50 mg daily by mouth.   . nitroGLYCERIN (NITROSTAT) 0.4 MG SL tablet Place 1 tablet (0.4 mg total) under the tongue every 5 (five) minutes as needed for chest pain.  Marland Kitchen omeprazole (PRILOSEC) 20 MG capsule Take 1 capsule by mouth daily.  . ondansetron (ZOFRAN) 4 MG tablet Take 1 tablet (4 mg total) by mouth every  8 (eight) hours as needed for nausea or vomiting.  Marland Kitchen oxyCODONE-acetaminophen (PERCOCET) 10-325 MG tablet Take 1 tablet by mouth daily as needed for pain.  Marland Kitchen prochlorperazine (COMPAZINE) 10 MG tablet Take 1 tablet (10 mg total) by mouth every 6 (six) hours as needed for nausea.  . SENSIPAR 30 MG tablet Take 1 tablet (30 mg total) by mouth daily.  . VENTOLIN HFA 108 (90 Base) MCG/ACT inhaler Inhale 2 puffs into the lungs daily as needed.     FAMILY HISTORY:  Her indicated that her mother is deceased. She indicated that her father is deceased. She indicated that her brother is alive.   SOCIAL HISTORY: She  reports that she quit smoking about 2 years ago. She smoked 0.25 packs per day. she has never used smokeless tobacco. She reports that she does not drink alcohol or use drugs.  REVIEW OF SYSTEMS:   10-point ROS negative except as per HPI  SUBJECTIVE:  Feeling better on bipap  VITAL SIGNS: BP (!) 167/97   Pulse (!) 57   Temp 98 F (36.7 C) (Oral)   Resp 16   Ht 5\' 3"  (1.6 m)   Wt 83.4 kg (183 lb 13.8 oz)   SpO2 100%   BMI 32.57 kg/m   HEMODYNAMICS:    VENTILATOR SETTINGS:    INTAKE / OUTPUT: No intake/output data recorded.  PHYSICAL EXAMINATION: General:  Non-distressed Neuro:  Grossly non-focal HEENT:  NCAT  Cardiovascular:  S1S2, RRR, 2/6 SEM Lungs:  Diminished with scattered wheezes bilaterally. Mildly increased WOB Abdomen:  Obese, soft, non tender, no organomegaly Musculoskeletal:  No gross deformity Skin:  Warm dry acyanotic  LABS:  BMET Recent Labs  Lab 09/21/17 0708  NA 139  K 4.9  CL 100*  CO2 25  BUN 56*  CREATININE 11.18*  GLUCOSE 116*    Electrolytes Recent Labs  Lab 09/21/17 0708 09/21/17 1009  CALCIUM 8.7*  --   PHOS  --  2.9    CBC Recent Labs  Lab 09/16/17 0705 09/21/17 0708  WBC  --  11.1*  HGB 8.2* 7.6*  HCT 25.6* 24.6*  PLT  --  304    Coag's No results for input(s): APTT, INR in the last 168 hours.  Sepsis  Markers No results for input(s): LATICACIDVEN, PROCALCITON, O2SATVEN in the last 168 hours.  ABG No results for input(s): PHART, PCO2ART, PO2ART in the last 168 hours.  Liver Enzymes No results for input(s): AST, ALT, ALKPHOS, BILITOT, ALBUMIN in the last 168 hours.  Cardiac Enzymes Recent Labs  Lab 09/21/17 0708  TROPONINI 0.03*    Glucose Recent Labs  Lab 09/21/17 0941  GLUCAP 82    Imaging Dg Chest Port 1 View  Result Date: 09/21/2017 CLINICAL DATA:  Difficulty breathing again during dialysis. History of CHF, asthma-COPD, former smoker. EXAM: PORTABLE CHEST 1 VIEW COMPARISON:  PA and lateral chest x-ray of September 06, 2017 FINDINGS: The lungs are well-expanded. The interstitial markings are increased diffusely. The pulmonary vascularity is engorged and the cardiac  silhouette is enlarged. A small amount of pleural fluid is suspected in the costophrenic gutters. IMPRESSION: CHF with moderate pulmonary interstitial edema. No alveolar pneumonia. Electronically Signed   By: David  Martinique M.D.   On: 09/21/2017 07:43    STUDIES:  2/12 CXR with pulmonary vascular congestion and interstitial edema.   CULTURES:   ANTIBIOTICS:   SIGNIFICANT EVENTS: 2/12 Admitted to ICU on NIV  LINES/TUBES: EJ  DISCUSSION: 56F with acutely decompensated HFpEF 2/2 volume overload. On HD and Lasix. Also treating for COPD exacerbation with wheezing  ASSESSMENT / PLAN:  Acute hypoxic respiratory failure  Acutely decompensated HFpEF End-stage renal disease H/O COPD  Wheezing, 2/2 interstitial edema vs. AECOPD Mild hyperkalemia, resolved with HD Elevated troponin in the setting of ESRD Anemia of chronic kidney disease NIV as needed, wean as able Supplemental O2 as required, spO2 goal 88-92% Continue HD per nephrology Continue Lasix Bronchodilators Monitor BMP [place] Heparin for DVT prophylaxis    Maretta Bees Patricia Pesa, M.D.  [facility]  Medical Director  [place] Director [facility] Cardio-Pulmonary Department

## 2017-09-21 NOTE — ED Provider Notes (Signed)
[facility] Emergency Department Provider Note    First MD Initiated Contact with Patient 09/21/17 [phone]     (approximate)  I have reviewed the triage vital signs and the nursing notes.  Level 5 caveat: History limited secondary to respiratory distress HISTORY  Chief Complaint Shortness of Breath and Respiratory Distress    HPI Jasmine Holloway is a 57 y.o. female with below list of chronic medical conditions including COPD and congestive heart failure as well at chronic kidney disease receiving dialysis Monday Wednesday Friday presents to the emergency department via EMS with respiratory distress from dialysis center.  Patient states that her last dialysis treatment was interrupted and as such less volume was removed.  Patient states this morning she smears progressive dyspnea before dialysis which continued while there prompting them to send her to the emergency department.  Patient denied any chest pain no fever.  Patient does admit to cough   Past Medical History:  Diagnosis Date  . Asthma   . CHF (congestive heart failure) ([place])   . COPD (chronic obstructive pulmonary disease) ([place])   . Diabetes mellitus without complication ([place])   . Diastolic heart failure ([place])   . ESRD (end stage renal disease) on dialysis ([place])   . Hypertension   . Neuropathy   . Polycystic kidney disease   . Renal insufficiency     Patient Active Problem List   Diagnosis Date Noted  . Accelerated hypertension 09/07/2017  . Lymphedema 05/11/2017  . Diabetes ([place]) 02/10/2017  . Esophageal stricture   . COPD (chronic obstructive pulmonary disease) ([place]) 08/06/2016  . Hypertensive crisis 08/06/2016  . Chronic pain syndrome 06/08/2016  . Anemia 06/08/2016  . Epigastric pain 03/28/2016  . Chronic diastolic heart failure ([place]) 02/04/2016  . Hoarseness of voice 02/04/2016  . Acute on chronic diastolic CHF (congestive heart failure) ([place]) 12/22/2015  . ESRD on dialysis ([place])  12/22/2015  . Essential hypertension 12/22/2015  . Mixed hyperlipidemia 12/22/2015  . Arm pain, left 05/31/2015  . Closed bilateral ankle fractures, with routine healing, subsequent encounter 07/24/2014  . Closed bilateral ankle fractures, initial encounter 06/20/2014    Past Surgical History:  Procedure Laterality Date  . ARTERIOVENOUS GRAFT PLACEMENT Right    x3 (R forearm currently used for access)  . BREAST BIOPSY Left 12/03/2015   neg  . COLON SURGERY    . ESOPHAGOGASTRODUODENOSCOPY (EGD) WITH PROPOFOL N/A 02/04/2017   Procedure: ESOPHAGOGASTRODUODENOSCOPY (EGD) WITH PROPOFOL;  Surgeon: Jonathon Bellows, MD;  Location: [facility] ENDOSCOPY;  Service: Endoscopy;  Laterality: N/A;  . PERIPHERAL VASCULAR CATHETERIZATION Right 08/04/2016   Procedure: A/V Shuntogram;  Surgeon: Algernon Huxley, MD;  Location: [facility] CV LAB;  Service: Cardiovascular;  Laterality: Right;  . VEIN HARVEST      Prior to Admission medications   Medication Sig Start Date End Date Taking? Authorizing Provider  acetaminophen (TYLENOL) 325 MG tablet Take 2 tablets (650 mg total) by mouth every 6 (six) hours as needed for mild pain or headache. Patient taking differently: Take 325 mg every 6 (six) hours as needed by mouth for mild pain or headache.  03/18/16   Gouru, Illene Silver, MD  amitriptyline (ELAVIL) 10 MG tablet Take 10 mg by mouth at bedtime as needed for sleep.    [provider]  amLODipine (NORVASC) 5 MG tablet Take 5 mg daily by mouth.  08/31/14   [provider]  aspirin 81 MG chewable tablet Chew 1 tablet (81 mg total) by mouth daily. 01/17/16  Gladstone Lighter, MD  b complex-vitamin c-folic acid (NEPHRO-VITE) 0.8 MG TABS tablet Take 1 tablet by mouth at bedtime.    [provider]  calcium acetate (PHOSLO) 667 MG capsule Take 1 capsule (667 mg total) by mouth 3 (three) times daily with meals. Patient taking differently: Take 1,334 mg by mouth 3 (three) times daily with meals.  02/02/17    Loletha Grayer, MD  cloNIDine (CATAPRES) 0.1 MG tablet Take 1 tablet (0.1 mg total) by mouth 2 (two) times daily. 01/17/16   Gladstone Lighter, MD  diphenhydrAMINE (BENADRYL) 25 mg capsule Take 2 capsules (50 mg total) by mouth every 6 (six) hours as needed. 10/16/16   Carrie Mew, MD  epoetin alfa (EPOGEN,PROCRIT) 96222 UNIT/ML injection 10,000 Units 3 (three) times a week.    [provider]  furosemide (LASIX) 80 MG tablet Take 1 tablet (80 mg total) by mouth daily. 03/05/16 11/10/17  Alisa Graff, FNP  gabapentin (NEURONTIN) 300 MG capsule Take 300 mg 2 (two) times daily by mouth.     [provider]  hydrALAZINE (APRESOLINE) 100 MG tablet Take 1 tablet (100 mg total) by mouth 3 (three) times daily. 09/07/17   Bettey Costa, MD  lidocaine (XYLOCAINE) 5 % ointment Apply topically.    [provider]  losartan (COZAAR) 100 MG tablet Take 1 tablet (100 mg total) by mouth daily. 09/07/17   Bettey Costa, MD  metoCLOPramide (REGLAN) 5 MG tablet Take 1 tablet (5 mg total) by mouth 2 (two) times daily as needed (nausea, vomiting or headache). 08/05/17 08/05/18  Earleen Newport, MD  metoprolol succinate (TOPROL-XL) 50 MG 24 hr tablet Take 50 mg daily by mouth.     [provider]  nitroGLYCERIN (NITROSTAT) 0.4 MG SL tablet Place 1 tablet (0.4 mg total) under the tongue every 5 (five) minutes as needed for chest pain. 01/14/17 09/17/18  Minna Merritts, MD  omeprazole (PRILOSEC) 20 MG capsule Take 1 capsule by mouth daily. 08/06/17   [provider]  ondansetron (ZOFRAN) 4 MG tablet Take 1 tablet (4 mg total) by mouth every 8 (eight) hours as needed for nausea or vomiting. 07/21/16   Daymon Larsen, MD  oxyCODONE-acetaminophen (PERCOCET) 10-325 MG tablet Take 1 tablet by mouth daily as needed for pain.    [provider]  prochlorperazine (COMPAZINE) 10 MG tablet Take 1 tablet (10 mg total) by mouth every 6 (six) hours as needed for nausea. 10/15/16    Paulette Blanch, MD  SENSIPAR 30 MG tablet Take 1 tablet (30 mg total) by mouth daily. 07/07/16   Fritzi Mandes, MD  VENTOLIN HFA 108 (90 Base) MCG/ACT inhaler Inhale 2 puffs into the lungs daily as needed.  05/21/16   [provider]    Allergies Morphine and related; Strawberry extract; Tramadol; Venofer [iron sucrose]; Vicodin [hydrocodone-acetaminophen]; Buprenorphine hcl; Codeine; and Morphine  Family History  Problem Relation Age of Onset  . Hypertension Brother   . Heart failure Brother   . Hypertension Mother   . Heart disease Mother     Social History Social History   Tobacco Use  . Smoking status: Former Smoker    Packs/day: 0.25    Last attempt to quit: 08/11/2015    Years since quitting: 2.1  . Smokeless tobacco: Never Used  Substance Use Topics  . Alcohol use: No    Alcohol/week: 0.0 oz  . Drug use: No    Review of Systems Constitutional: No fever/chills Eyes: No visual changes. ENT:  No sore throat. Cardiovascular: Denies chest pain. Respiratory: Positive for dyspnea Gastrointestinal: No abdominal pain.  No nausea, no vomiting.  No diarrhea.  No constipation. Genitourinary: Negative for dysuria. Musculoskeletal: Negative for neck pain.  Negative for back pain. Integumentary: Negative for rash. Neurological: Negative for headaches, focal weakness or numbness.   ____________________________________________   PHYSICAL EXAM:  VITAL SIGNS: ED Triage Vitals  Enc Vitals Group     BP 09/21/17 0707 (!) 128/93     Pulse Rate 09/21/17 0707 70     Resp 09/21/17 0707 20     Temp 09/21/17 0707 97.9 F (36.6 C)     Temp Source 09/21/17 0707 Axillary     SpO2 09/21/17 0707 100 %     Weight 09/21/17 0710 80.3 kg (177 lb)     Height 09/21/17 0710 1.6 m (5\' 3" )     Head Circumference --      Peak Flow --      Pain Score 09/21/17 0710 0     Pain Loc --      Pain Edu? --      Excl. in [place]? --     Constitutional: Alert and oriented.  Apparent respiratory  distress  eyes: Conjunctivae are normal.  Head: Atraumatic. Mouth/Throat: Mucous membranes are moist.  Oropharynx non-erythematous. Neck: No stridor.   Cardiovascular: Tachycardia, regular rhythm. Good peripheral circulation. Grossly normal heart sounds. Respiratory: Tachypnea, positive accessory respiratory muscle use, diffuse Rales and rhonchi.  Patient speaking in 2 word phrases.   Gastrointestinal: Soft and nontender. No distention.  Musculoskeletal: No lower extremity tenderness nor edema. No gross deformities of extremities. Neurologic:  Normal speech and language. No gross focal neurologic deficits are appreciated.  Skin:  Skin is warm, dry and intact. No rash noted. Psychiatric: Mood and affect are normal. Speech and behavior are normal.  ____________________________________________   LABS (all labs ordered are listed, but only abnormal results are displayed)  Labs Reviewed  CBC WITH DIFFERENTIAL/PLATELET - Abnormal; Notable for the following components:      Result Value   WBC 11.1 (*)    RBC 2.83 (*)    Hemoglobin 7.6 (*)    HCT 24.6 (*)    MCHC 31.1 (*)    RDW 22.9 (*)    Neutro Abs 9.0 (*)    All other components within normal limits  BASIC METABOLIC PANEL  BRAIN NATRIURETIC PEPTIDE  TROPONIN I   ____________________________________________  EKG  ED ECG REPORT I, [place] N BROWN, the attending physician, personally viewed and interpreted this ECG.   Date: 09/21/2017  EKG Time: 7:05 AM  Rate: 71  Rhythm: Normal sinus rhythm  Axis: Normal  Intervals: Normal  ST&T Change: None  ____________________________________________  RADIOLOGY I, [place] N BROWN, personally viewed and evaluated these images (plain radiographs) as part of my medical decision making, as well as reviewing the written report by the radiologist.  ED MD interpretation: Chest x-ray consistent with volume overload revealing pulmonary edema cephalization of pulmonary markings  cardiomegaly.  Official radiology report(s): No results found.  ____________________________________________   PROCEDURES  Critical Care performed: CRITICAL CARE Performed by: Gregor Hams   Total critical care time: 45 minutes  Critical care time was exclusive of separately billable procedures and treating other patients.  Critical care was necessary to treat or prevent imminent or life-threatening deterioration.  Critical care was time spent personally by me on the following activities: development of treatment plan with patient and/or surrogate as well as nursing, discussions with  consultants, evaluation of patient's response to treatment, examination of patient, obtaining history from patient or surrogate, ordering and performing treatments and interventions, ordering and review of laboratory studies, ordering and review of radiographic studies, pulse oximetry and re-evaluation of patient's condition.  Procedures   ____________________________________________   INITIAL IMPRESSION / ASSESSMENT AND PLAN / ED COURSE  As part of my medical decision making, I reviewed the following data within the electronic MEDICAL RECORD 40 year old female presented with history of physical exam concerning for pulmonary edema.  BiPAP applied to the patient shortly after arrival to the emergency department.  Patient given 80 mg of Lasix IV.  Following both of these interventions patient's respiratory status markedly tachypneic no longer utilizing accessory respiratory muscle use.  Patient discussed with Dr. Holley Raring nephrologist on call with plan for urgent dialysis.  Patient discussed with Dr. Anselm Jungling hospitalist on-call for admission for further evaluation and management.    ____________________________________________  FINAL CLINICAL IMPRESSION(S) / ED DIAGNOSES  Final diagnoses:  Acute respiratory failure with hypoxia ([place])  Acute pulmonary edema ([place])     MEDICATIONS GIVEN DURING  THIS VISIT:  Medications  furosemide (LASIX) injection 80 mg (80 mg Intravenous Given 09/21/17 0701)     ED Discharge Orders    None       Note:  This document was prepared using Dragon voice recognition software and may include unintentional dictation errors.    Gregor Hams, MD 09/21/17 [phone]

## 2017-09-22 ENCOUNTER — Other Ambulatory Visit: Payer: Self-pay

## 2017-09-22 LAB — BASIC METABOLIC PANEL
ANION GAP: 13 (ref 5–15)
BUN: 37 mg/dL — ABNORMAL HIGH (ref 6–20)
CALCIUM: 8.6 mg/dL — AB (ref 8.9–10.3)
CO2: 26 mmol/L (ref 22–32)
CREATININE: 7.7 mg/dL — AB (ref 0.44–1.00)
Chloride: 97 mmol/L — ABNORMAL LOW (ref 101–111)
GFR, EST AFRICAN AMERICAN: 6 mL/min — AB (ref >60)
GFR, EST NON AFRICAN AMERICAN: 5 mL/min — AB (ref >60)
GLUCOSE: 84 mg/dL (ref 65–99)
Potassium: 5.2 mmol/L — ABNORMAL HIGH (ref 3.5–5.1)
Sodium: 136 mmol/L (ref 135–145)

## 2017-09-22 LAB — PHOSPHORUS: Phosphorus: 4.1 mg/dL (ref 2.5–4.6)

## 2017-09-22 LAB — GLUCOSE, CAPILLARY
GLUCOSE-CAPILLARY: 102 mg/dL — AB (ref 65–99)
GLUCOSE-CAPILLARY: 102 mg/dL — AB (ref 65–99)
GLUCOSE-CAPILLARY: 97 mg/dL (ref 65–99)
Glucose-Capillary: 83 mg/dL (ref 65–99)

## 2017-09-22 MED ORDER — INSULIN ASPART 100 UNIT/ML ~~LOC~~ SOLN: 0.0000 [IU] | Freq: Every day | SUBCUTANEOUS | Status: DC

## 2017-09-22 MED ORDER — INSULIN ASPART 100 UNIT/ML ~~LOC~~ SOLN: 0.0000 [IU] | Freq: Three times a day (TID) | SUBCUTANEOUS | Status: DC

## 2017-09-22 NOTE — Progress Notes (Signed)
Patient discharged s/p dialysis per MD order. Discharge instructions reviewed. Niece and brother in law to transport home. O2 at bedside for discharge. IVs removed without complications.

## 2017-09-22 NOTE — Progress Notes (Signed)
HD Tx started  

## 2017-09-22 NOTE — Progress Notes (Signed)
Pre HD assessment  

## 2017-09-22 NOTE — Progress Notes (Signed)
PULMONARY / CRITICAL CARE MEDICINE   Name: Jasmine Holloway MRN: [id number] DOB: 14-Feb-1961    ADMISSION DATE:  09/21/2017 CONSULTATION DATE:  09/21/17  REFERRING MD:  Dr. Darvin Neighbours  CHIEF COMPLAINT:  Shortness of breath  SIGNIFICANT EVENTS: 2/12 Admitted to ICU on NIV 2/13 NIV weaned yesterday to HFNC. HFNC removed yesterday evening, pt on regular [place] now  HISTORY OF PRESENT ILLNESS:   16F with PMH as below significant for HFpEF, COPD, ESRD on HD presents with acutely worsening shortness of breath over the past couple of days. She states that at her last HD appointment, only 1 kg of fluid was removed, down from a more typical 2-3 kg. This was because of hypotension. Pt also endorses non-productive cough, orthopnea, PND, exertional dyspnea and LE edema. She denies fever, headache, and n/v/d.   In the ED, CXR with pulmonary edema, and BNP elevated at 1122. Pt was started on BiPap for respiratory failure and admitted to the ICU.  PAST MEDICAL HISTORY :  She  has a past medical history of Asthma, CHF (congestive heart failure) ([place]), COPD (chronic obstructive pulmonary disease) ([place]), Diabetes mellitus without complication ([place]), Diastolic heart failure ([place]), ESRD (end stage renal disease) on dialysis ([place]), Hypertension, Neuropathy, Polycystic kidney disease, and Renal insufficiency.  PAST SURGICAL HISTORY: She  has a past surgical history that includes Arteriovenous graft placement (Right); Vein harvest; Colon surgery; Cardiac catheterization (Right, 08/04/2016); Breast biopsy (Left, 12/03/2015); and Esophagogastroduodenoscopy (egd) with propofol (N/A, 02/04/2017).  Allergies  Allergen Reactions  . Morphine And Related Nausea And Vomiting  . Strawberry Extract   . Tramadol Nausea And Vomiting  . Venofer [Iron Sucrose]   . Buprenorphine Hcl Nausea And Vomiting  . Morphine Nausea And Vomiting    Halllucinations    No current facility-administered medications on file prior to encounter.     Current Outpatient Medications on File Prior to Encounter  Medication Sig  . acetaminophen (TYLENOL) 325 MG tablet Take 2 tablets (650 mg total) by mouth every 6 (six) hours as needed for mild pain or headache. (Patient taking differently: Take 325 mg every 6 (six) hours as needed by mouth for mild pain or headache. )  . amitriptyline (ELAVIL) 10 MG tablet Take 10 mg by mouth at bedtime as needed for sleep.  Marland Kitchen amLODipine (NORVASC) 5 MG tablet Take 5 mg daily by mouth.   Marland Kitchen aspirin 81 MG chewable tablet Chew 1 tablet (81 mg total) by mouth daily.  Marland Kitchen b complex-vitamin c-folic acid (NEPHRO-VITE) 0.8 MG TABS tablet Take 1 tablet by mouth daily.   . calcium acetate (PHOSLO) 667 MG capsule Take 1 capsule (667 mg total) by mouth 3 (three) times daily with meals. (Patient taking differently: Take 1,334 mg by mouth 3 (three) times daily with meals. )  . cloNIDine (CATAPRES) 0.1 MG tablet Take 1 tablet (0.1 mg total) by mouth 2 (two) times daily.  . diphenhydrAMINE (BENADRYL) 25 mg capsule Take 2 capsules (50 mg total) by mouth every 6 (six) hours as needed.  Marland Kitchen epoetin alfa (EPOGEN,PROCRIT) 09326 UNIT/ML injection 10,000 Units 3 (three) times a week.  . furosemide (LASIX) 80 MG tablet Take 1 tablet (80 mg total) by mouth daily.  Marland Kitchen gabapentin (NEURONTIN) 300 MG capsule Take 300 mg 2 (two) times daily by mouth.   . hydrALAZINE (APRESOLINE) 100 MG tablet Take 1 tablet (100 mg total) by mouth 3 (three) times daily.  Marland Kitchen lidocaine (XYLOCAINE) 5 % ointment Apply topically.  Marland Kitchen losartan (COZAAR) 100 MG tablet Take  1 tablet (100 mg total) by mouth daily.  . metoCLOPramide (REGLAN) 5 MG tablet Take 1 tablet (5 mg total) by mouth 2 (two) times daily as needed (nausea, vomiting or headache).  . metoprolol succinate (TOPROL-XL) 50 MG 24 hr tablet Take 50 mg daily by mouth.   . nitroGLYCERIN (NITROSTAT) 0.4 MG SL tablet Place 1 tablet (0.4 mg total) under the tongue every 5 (five) minutes as needed for chest pain.  Marland Kitchen  omeprazole (PRILOSEC) 20 MG capsule Take 1 capsule by mouth daily.  . ondansetron (ZOFRAN) 4 MG tablet Take 1 tablet (4 mg total) by mouth every 8 (eight) hours as needed for nausea or vomiting.  Marland Kitchen oxyCODONE-acetaminophen (PERCOCET) 10-325 MG tablet Take 1 tablet by mouth daily as needed for pain.  Marland Kitchen prochlorperazine (COMPAZINE) 10 MG tablet Take 1 tablet (10 mg total) by mouth every 6 (six) hours as needed for nausea.  . SENSIPAR 30 MG tablet Take 1 tablet (30 mg total) by mouth daily.  . VENTOLIN HFA 108 (90 Base) MCG/ACT inhaler Inhale 2 puffs into the lungs daily as needed.     FAMILY HISTORY:  Her indicated that her mother is deceased. She indicated that her father is deceased. She indicated that her brother is alive.   SOCIAL HISTORY: She  reports that she quit smoking about 2 years ago. She smoked 0.25 packs per day. she has never used smokeless tobacco. She reports that she does not drink alcohol or use drugs.  REVIEW OF SYSTEMS:   10-point ROS negative except as per HPI  SUBJECTIVE:  Feeling better on bipap  VITAL SIGNS: BP (!) 154/91   Pulse 67   Temp 98.1 F (36.7 C) (Oral)   Resp 17   Ht 5\' 3"  (1.6 m)   Wt 82.5 kg (181 lb 14.1 oz)   SpO2 95%   BMI 32.22 kg/m   HEMODYNAMICS:    VENTILATOR SETTINGS: FiO2 (%):  [33 %] 33 %  INTAKE / OUTPUT: I/O last 3 completed shifts: In: 0  Out: 2579 [Other:2579]  PHYSICAL EXAMINATION: General:  Non-distressed Neuro:  Grossly non-focal HEENT:  NCAT  Cardiovascular:  S1S2, RRR, 2/6 SEM Lungs:  Few scattered wheezes bilaterally. Good air movement. Resp effort normal Abdomen:  Obese, soft, non tender, no organomegaly Musculoskeletal:  No gross deformity Skin:  Warm dry acyanotic  LABS:  BMET Recent Labs  Lab 09/21/17 0708  NA 139  K 4.9  CL 100*  CO2 25  BUN 56*  CREATININE 11.18*  GLUCOSE 116*    Electrolytes Recent Labs  Lab 09/21/17 0708 09/21/17 1009  CALCIUM 8.7*  --   PHOS  --  2.9     CBC Recent Labs  Lab 09/16/17 0705 09/21/17 0708  WBC  --  11.1*  HGB 8.2* 7.6*  HCT 25.6* 24.6*  PLT  --  304    Coag's No results for input(s): APTT, INR in the last 168 hours.  Sepsis Markers No results for input(s): LATICACIDVEN, PROCALCITON, O2SATVEN in the last 168 hours.  ABG No results for input(s): PHART, PCO2ART, PO2ART in the last 168 hours.  Liver Enzymes No results for input(s): AST, ALT, ALKPHOS, BILITOT, ALBUMIN in the last 168 hours.  Cardiac Enzymes Recent Labs  Lab 09/21/17 0708  TROPONINI 0.03*    Glucose Recent Labs  Lab 09/21/17 1151 09/21/17 1711 09/21/17 1958 09/22/17 0029 09/22/17 0403 09/22/17 0801  GLUCAP 77 118* 111* 102* 102* 83    Imaging No results found.  STUDIES:  2/12 CXR with pulmonary vascular congestion and interstitial edema.   CULTURES:   ANTIBIOTICS:  LINES/TUBES: EJ  DISCUSSION: 64F with acutely decompensated HFpEF 2/2 volume overload. On HD and Lasix. Also treating for COPD exacerbation with wheezing  ASSESSMENT / PLAN:  Acute hypoxic respiratory failure  Acutely decompensated HFpEF, EF 60-65% End-stage renal disease H/O COPD  Wheezing, 2/2 interstitial edema vs. AECOPD Mild hyperkalemia, resolved with HD Elevated troponin in the setting of ESRD Anemia of chronic kidney disease Supplemental O2 as required, spO2 goal 88-92% Continue HD per nephrology Continue Lasix Bronchodilators Monitor BMP [place] Heparin for DVT prophylaxis Stable for floor transfer   Audery Amel, PA-S

## 2017-09-22 NOTE — Discharge Instructions (Signed)
Renal diet  Activity as tolerated  Continue O2 as before

## 2017-09-22 NOTE — Progress Notes (Signed)
Pre HD Tx  

## 2017-09-22 NOTE — Progress Notes (Signed)
Post HD Tx  

## 2017-09-22 NOTE — Progress Notes (Signed)
Pt had clotted system, unable to return blood. New set-up in place. [name] resumed

## 2017-09-22 NOTE — Progress Notes (Signed)
Post HD assessment  

## 2017-09-22 NOTE — Progress Notes (Signed)
Report given to Jasmine Holloway who will be taking over patient's care. Dr. Mortimer Fries gave order for patient to go to dialysis without an nurse.

## 2017-09-22 NOTE — Progress Notes (Signed)
[facility]  ROUNDING NOTE   Subjective:  Patient presented with significant shortness of breath. Prior to admission she admits to consuming a large amount of fluid.   She is now weaned off of BiPAP.   Objective:  Vital signs in last 24 hours:  Temp:  [98 F (36.7 C)-98.6 F (37 C)] 98.1 F (36.7 C) (02/13 0754) Pulse Rate:  [56-77] 67 (02/13 0800) Resp:  [16-31] 17 (02/13 0800) BP: (115-176)/(76-99) 154/91 (02/13 0700) SpO2:  [90 %-100 %] 95 % (02/13 0811) FiO2 (%):  [33 %] 33 % (02/12 2040) Weight:  [81 kg (178 lb 9.2 oz)-83.4 kg (183 lb 13.8 oz)] 82.5 kg (181 lb 14.1 oz) (02/13 0407)  Weight change:  Filed Weights   09/21/17 0950 09/21/17 1326 09/22/17 0407  Weight: 83.4 kg (183 lb 13.8 oz) 81 kg (178 lb 9.2 oz) 82.5 kg (181 lb 14.1 oz)    Intake/Output: I/O last 3 completed shifts: In: 0  Out: 2579 [Other:2579]   Intake/Output this shift:  No intake/output data recorded.  Physical Exam: General: No acute distress  Head: Normocephalic, atraumatic. Moist oral mucosal membranes  Eyes: Anicteric  Neck: Supple, trachea midline  Lungs:  Bilateral rales, normal effort  Heart: S1S2 no rubs  Abdomen:  Soft, nontender, bowel sounds present  Extremities: 1+ peripheral edema.  Neurologic: Awake, alert, following commands  Skin: No lesions  Access: RUE AVG    Basic Metabolic Panel: Recent Labs  Lab 09/21/17 0708 09/21/17 1009 09/22/17 0819  NA 139  --  136  K 4.9  --  5.2*  CL 100*  --  97*  CO2 25  --  26  GLUCOSE 116*  --  84  BUN 56*  --  37*  CREATININE 11.18*  --  7.70*  CALCIUM 8.7*  --  8.6*  PHOS  --  2.9  --     Liver Function Tests: No results for input(s): AST, ALT, ALKPHOS, BILITOT, PROT, ALBUMIN in the last 168 hours. No results for input(s): LIPASE, AMYLASE in the last 168 hours. No results for input(s): AMMONIA in the last 168 hours.  CBC: Recent Labs  Lab 09/16/17 0705 09/21/17 0708  WBC  --  11.1*  NEUTROABS  --  9.0*   HGB 8.2* 7.6*  HCT 25.6* 24.6*  MCV  --  86.7  PLT  --  304    Cardiac Enzymes: Recent Labs  Lab 09/21/17 0708  TROPONINI 0.03*    BNP: Invalid input(s): POCBNP  CBG: Recent Labs  Lab 09/21/17 1711 09/21/17 1958 09/22/17 0029 09/22/17 0403 09/22/17 0801  GLUCAP 118* 111* 102* 102* 64    Microbiology: Results for orders placed or performed during the hospital encounter of 09/06/17  MRSA PCR Screening     Status: None   Collection Time: 09/07/17  2:40 AM  Result Value Ref Range Status   MRSA by PCR NEGATIVE NEGATIVE Final    Comment:        The GeneXpert MRSA Assay (FDA approved for NASAL specimens only), is one component of a comprehensive MRSA colonization surveillance program. It is not intended to diagnose MRSA infection nor to guide or monitor treatment for MRSA infections. Performed at [facility], [address] [place], [place] [address]     Coagulation Studies: No results for input(s): LABPROT, INR in the last 72 hours.  Urinalysis: No results for input(s): COLORURINE, LABSPEC, PHURINE, GLUCOSEU, HGBUR, BILIRUBINUR, KETONESUR, PROTEINUR, UROBILINOGEN, NITRITE, LEUKOCYTESUR in the last 72 hours.  Invalid input(s): APPERANCEUR    Imaging: Dg Chest Port 1 View  Result Date: 09/21/2017 CLINICAL DATA:  Difficulty breathing again during dialysis. History of CHF, asthma-COPD, former smoker. EXAM: PORTABLE CHEST 1 VIEW COMPARISON:  PA and lateral chest x-ray of September 06, 2017 FINDINGS: The lungs are well-expanded. The interstitial markings are increased diffusely. The pulmonary vascularity is engorged and the cardiac silhouette is enlarged. A small amount of pleural fluid is suspected in the costophrenic gutters. IMPRESSION: CHF with moderate pulmonary interstitial edema. No alveolar pneumonia. Electronically Signed   By: David  Martinique M.D.   On: 09/21/2017 07:43     Medications:   . sodium chloride    . sodium chloride     .  amLODipine  5 mg Oral Daily  . aspirin  81 mg Oral Daily  . calcium acetate  667 mg Oral TID WC  . cinacalcet  30 mg Oral Daily  . cloNIDine  0.1 mg Oral BID  . furosemide  80 mg Oral Daily  . gabapentin  300 mg Oral BID  . heparin  5,000 Units Subcutaneous Q8H  . hydrALAZINE  100 mg Oral TID  . insulin aspart  0-15 Units Subcutaneous TID WC  . insulin aspart  0-5 Units Subcutaneous QHS  . ipratropium-albuterol  3 mL Nebulization Q6H  . losartan  100 mg Oral Daily  . metoprolol succinate  50 mg Oral Daily  . pantoprazole  40 mg Oral Daily   sodium chloride, sodium chloride, acetaminophen **OR** acetaminophen, albuterol, alteplase, amitriptyline, heparin, hydrALAZINE, lidocaine (PF), lidocaine-prilocaine, metoCLOPramide, nitroGLYCERIN, ondansetron **OR** ondansetron (ZOFRAN) IV, oxyCODONE-acetaminophen **AND** oxyCODONE, pentafluoroprop-tetrafluoroeth, polyethylene glycol  Assessment/ Plan:  57 y.o. female with end-stage renal disease secondary to polycystic kidney disease on hemodialysis, hypertension, GERD, diastolic congestive heart failure, peripheral neuropathy, COPD/asthma  TTS [facility] [address] Right AVG.  1.  ESRD on HD TTS.  Perform an extra dialysis treatment today 09/22/17 for fluid overload.  2.  Acute respiratory failure.  Patient previously required BiPAP.  Significant improvement in respiratory status.  Patient is now on nasal cannula.  Continue to monitor respiratory status.  3.  Anemia chronic kidney disease.  Hemoglobin low at 7.6 (2/12).  Continue to monitor.  Restart Epogen with next dialysis treatment tomorrow.  4.  Secondary hyperparathyroidism.  Continue calcium acetate and Sensipar.  Phosphorous 2.9 (2/12). Recheck serum phosphorus tomorrow.     LOS: 1 Jasmine Holloway 2/13/20199:38 AM

## 2017-09-22 NOTE — Progress Notes (Signed)
HD Tx ended, early, MD notified. Uf goal met.

## 2017-09-24 NOTE — Discharge Summary (Signed)
[place] [facility] [place] NAME: Jasmine Holloway    MR#:  [id number]  DATE OF BIRTH:  05/06/1961  DATE OF ADMISSION:  09/21/2017 ADMITTING PHYSICIAN: Hillary Bow, MD  DATE OF DISCHARGE: 09/22/2017  3:50 PM  PRIMARY CARE PHYSICIAN: Theotis Burrow, MD   ADMISSION DIAGNOSIS:  Acute pulmonary edema ([place]) [J81.0] Acute respiratory failure with hypoxia ([place]) [J96.01]  DISCHARGE DIAGNOSIS:  Active Problems:   CHF (congestive heart failure) ([place])   SECONDARY DIAGNOSIS:   Past Medical History:  Diagnosis Date  . Asthma   . CHF (congestive heart failure) ([place])   . COPD (chronic obstructive pulmonary disease) ([place])   . Diabetes mellitus without complication ([place])   . Diastolic heart failure ([place])   . ESRD (end stage renal disease) on dialysis ([place])   . Hypertension   . Neuropathy   . Polycystic kidney disease   . Renal insufficiency      ADMITTING HISTORY  HISTORY OF PRESENT ILLNESS:  Jasmine Holloway  is a 57 y.o. female who presents with several days of progressive shortness of breath.  Patient states that her blood pressure has been elevated for the past couple of days, and significantly more so today.  She came in when her shortness of breath became much worse tonight.  Here she is found to have pulmonary edema on imaging and very elevated blood pressure.  Hospitalist were called for admission  HOSPITAL COURSE:   *Acute on chronic diastolic congestive heart failure in setting of end-stage renal disease with acute on chronic hypoxic respiratory failure  This is due to noncompliance with fluids by the patient.  Patient had 2 sessions of hemodialysis over her hospital stay with removal of fluid.  Significant improvement and patient is down to 2 L oxygen by day of discharge.  Counseled by me and also Dr. Zollie Scale to restrict fluid.  Unfortunately patient has had recurrent admissions with similar issues.  High risk for readmission as  before.  *Hypertension.  Well controlled with home medications.  *Diabetes mellitus.   Home medications continued and sliding scale insulin place during hospital stay.  * COPD Initially started on IV steroids due to concern for COPD exacerbation.  But with hemodialysis the wheezing resolved.  Likely due to pulmonary edema.  Patient stable for discharge home with home oxygen and continue hemodialysis as outpatient.  Renal diet with fluid restriction.   CONSULTS OBTAINED:  Treatment Team:  Anthonette Legato, MD  DRUG ALLERGIES:   Allergies  Allergen Reactions  . Morphine And Related Nausea And Vomiting  . Strawberry Extract   . Tramadol Nausea And Vomiting  . Venofer [Iron Sucrose]   . Buprenorphine Hcl Nausea And Vomiting  . Morphine Nausea And Vomiting    Halllucinations    DISCHARGE MEDICATIONS:   Allergies as of 09/22/2017      Reactions   Morphine And Related Nausea And Vomiting   Strawberry Extract    Tramadol Nausea And Vomiting   Venofer [iron Sucrose]    Buprenorphine Hcl Nausea And Vomiting   Morphine Nausea And Vomiting   Halllucinations      Medication List    TAKE these medications   acetaminophen 325 MG tablet Commonly known as:  TYLENOL Take 2 tablets (650 mg total) by mouth every 6 (six) hours as needed for mild pain or headache. What changed:  how much to take   amitriptyline 10 MG tablet Commonly known as:  ELAVIL Take 10 mg by mouth at  bedtime as needed for sleep.   amLODipine 5 MG tablet Commonly known as:  NORVASC Take 5 mg daily by mouth.   aspirin 81 MG chewable tablet Chew 1 tablet (81 mg total) by mouth daily.   b complex-vitamin c-folic acid 0.8 MG Tabs tablet Take 1 tablet by mouth daily.   calcium acetate 667 MG capsule Commonly known as:  PHOSLO Take 1 capsule (667 mg total) by mouth 3 (three) times daily with meals. What changed:  how much to take   cloNIDine 0.1 MG tablet Commonly known as:  CATAPRES Take 1 tablet  (0.1 mg total) by mouth 2 (two) times daily.   diphenhydrAMINE 25 mg capsule Commonly known as:  BENADRYL Take 2 capsules (50 mg total) by mouth every 6 (six) hours as needed.   epoetin alfa 10000 UNIT/ML injection Commonly known as:  EPOGEN,PROCRIT 10,000 Units 3 (three) times a week.   furosemide 80 MG tablet Commonly known as:  LASIX Take 1 tablet (80 mg total) by mouth daily.   gabapentin 300 MG capsule Commonly known as:  NEURONTIN Take 300 mg 2 (two) times daily by mouth.   hydrALAZINE 100 MG tablet Commonly known as:  APRESOLINE Take 1 tablet (100 mg total) by mouth 3 (three) times daily.   lidocaine 5 % ointment Commonly known as:  XYLOCAINE Apply topically.   losartan 100 MG tablet Commonly known as:  COZAAR Take 1 tablet (100 mg total) by mouth daily.   metoCLOPramide 5 MG tablet Commonly known as:  REGLAN Take 1 tablet (5 mg total) by mouth 2 (two) times daily as needed (nausea, vomiting or headache).   metoprolol succinate 50 MG 24 hr tablet Commonly known as:  TOPROL-XL Take 50 mg daily by mouth.   nitroGLYCERIN 0.4 MG SL tablet Commonly known as:  NITROSTAT Place 1 tablet (0.4 mg total) under the tongue every 5 (five) minutes as needed for chest pain.   omeprazole 20 MG capsule Commonly known as:  PRILOSEC Take 1 capsule by mouth daily.   ondansetron 4 MG tablet Commonly known as:  ZOFRAN Take 1 tablet (4 mg total) by mouth every 8 (eight) hours as needed for nausea or vomiting.   oxyCODONE-acetaminophen 10-325 MG tablet Commonly known as:  PERCOCET Take 1 tablet by mouth daily as needed for pain.   prochlorperazine 10 MG tablet Commonly known as:  COMPAZINE Take 1 tablet (10 mg total) by mouth every 6 (six) hours as needed for nausea.   SENSIPAR 30 MG tablet Generic drug:  cinacalcet Take 1 tablet (30 mg total) by mouth daily.   VENTOLIN HFA 108 (90 Base) MCG/ACT inhaler Generic drug:  albuterol Inhale 2 puffs into the lungs daily as  needed.       Today   VITAL SIGNS:  Blood pressure (!) 150/87, pulse 78, temperature 98.1 F (36.7 C), temperature source Oral, resp. rate 17, height 5\' 3"  (1.6 m), weight 81.6 kg (179 lb 14.3 oz), SpO2 95 %.  I/O:  No intake or output data in the 24 hours ending 09/24/17 1637  PHYSICAL EXAMINATION:  Physical Exam  GENERAL:  57 y.o.-year-old patient lying in the bed with no acute distress.  LUNGS: Normal breath sounds bilaterally, no wheezing, rales,rhonchi or crepitation. No use of accessory muscles of respiration.  CARDIOVASCULAR: S1, S2 normal. No murmurs, rubs, or gallops.  ABDOMEN: Soft, non-tender, non-distended. Bowel sounds present. No organomegaly or mass.  NEUROLOGIC: Moves all 4 extremities. PSYCHIATRIC: The patient is alert and oriented x 3.  SKIN: No obvious rash, lesion, or ulcer.   DATA REVIEW:   CBC Recent Labs  Lab 09/21/17 0708  WBC 11.1*  HGB 7.6*  HCT 24.6*  PLT 304    Chemistries  Recent Labs  Lab 09/22/17 0819  NA 136  K 5.2*  CL 97*  CO2 26  GLUCOSE 84  BUN 37*  CREATININE 7.70*  CALCIUM 8.6*    Cardiac Enzymes Recent Labs  Lab 09/21/17 0708  TROPONINI 0.03*    Microbiology Results  Results for orders placed or performed during the hospital encounter of 09/06/17  MRSA PCR Screening     Status: None   Collection Time: 09/07/17  2:40 AM  Result Value Ref Range Status   MRSA by PCR NEGATIVE NEGATIVE Final    Comment:        The GeneXpert MRSA Assay (FDA approved for NASAL specimens only), is one component of a comprehensive MRSA colonization surveillance program. It is not intended to diagnose MRSA infection nor to guide or monitor treatment for MRSA infections. Performed at [facility], [address], [place], [place] [address]     RADIOLOGY:  No results found.  Follow up with PCP in 1 week.  Management plans discussed with the patient, family and they are in agreement.  CODE STATUS:  Code Status  History    Date Active Date Inactive Code Status Order ID Comments User Context   09/21/2017 08:22 09/22/2017 18:55 Full Code [id number]  Hillary Bow, MD ED   09/07/2017 02:30 09/07/2017 19:08 Full Code [id number]  Lance Coon, MD Inpatient   06/15/2017 19:46 06/17/2017 18:43 Full Code [id number]  Dustin Flock, MD Inpatient   02/01/2017 07:47 02/02/2017 17:40 Full Code [id number]  Loletha Grayer, MD ED   11/10/2016 08:21 11/11/2016 17:54 Full Code [id number]  [place], Ubaldo Glassing, DO Inpatient   07/04/2016 05:09 07/07/2016 20:31 Full Code [id number]  Mikael Spray, NP ED   06/09/2016 04:58 06/10/2016 19:32 Full Code [id number]  Awilda Bill, NP ED   05/11/2016 10:24 05/12/2016 18:26 Full Code [id number]  Vaughan Basta, MD Inpatient   04/28/2016 20:30 04/30/2016 17:49 Full Code [id number]  Gladstone Lighter, MD Inpatient   03/28/2016 18:25 03/29/2016 17:26 Full Code [id number]  Vaughan Basta, MD ED   03/17/2016 06:06 03/18/2016 07:51 Full Code [id number]  Saundra Shelling, MD ED   01/10/2016 21:11 01/17/2016 21:36 Full Code [id number]  Mikael Spray, NP ED   12/21/2015 06:08 12/22/2015 16:09 Full Code [id number]  Saundra Shelling, MD Inpatient   02/24/2015 03:52 02/24/2015 16:15 Full Code [id number]  Harrie Foreman, MD Inpatient      TOTAL TIME TAKING CARE OF THIS PATIENT ON DAY OF DISCHARGE: more than 30 minutes.   Neita Carp M.D on 09/24/2017 at 4:37 PM  Between 7am to 6pm - Pager - [phone]  After 6pm go to [email or web address] - password [email or web address] [facility]  Office  [phone]  CC: Primary care physician; Theotis Burrow, MD  Note: This dictation was prepared with Dragon dictation along with smaller phrase technology. Any transcriptional errors that result from this process are unintentional.

## 2017-09-30 ENCOUNTER — Ambulatory Visit: Admission: RE | Admit: 2017-09-30 | Payer: Medicare Other | Source: Ambulatory Visit

## 2017-10-01 ENCOUNTER — Ambulatory Visit
Admission: RE | Admit: 2017-10-01 | Discharge: 2017-10-01 | Disposition: A | Discharge status: Home / Self Care | Payer: Medicare Other | Source: Ambulatory Visit | Attending: Nephrology | Admitting: Nephrology

## 2017-10-01 ENCOUNTER — Ambulatory Visit: Payer: Medicare Other | Admitting: Family

## 2017-10-01 DIAGNOSIS — N186 End stage renal disease: Secondary | ICD-10-CM | POA: Diagnosis not present

## 2017-10-01 DIAGNOSIS — D649 Anemia, unspecified: Secondary | ICD-10-CM | POA: Insufficient documentation

## 2017-10-01 LAB — HEMOGLOBIN: HEMOGLOBIN: 7.7 g/dL — AB (ref 12.0–16.0)

## 2017-10-01 LAB — PREPARE RBC (CROSSMATCH)

## 2017-10-02 LAB — TYPE AND SCREEN
ABO/RH(D): O POS
ANTIBODY SCREEN: NEGATIVE
UNIT DIVISION: 0

## 2017-10-02 LAB — BPAM RBC
Blood Product Expiration Date: 201903212359
ISSUE DATE / TIME: 201902220931
UNIT TYPE AND RH: 5100

## 2017-10-04 ENCOUNTER — Encounter
Admit: 2017-10-04 | Discharge: 2017-10-04 | Payer: 59 | Attending: Student in an Organized Health Care Education/Training Program | Primary: Student in an Organized Health Care Education/Training Program

## 2017-10-04 DIAGNOSIS — Z7682 Awaiting organ transplant status: Principal | ICD-10-CM

## 2017-10-16 ENCOUNTER — Other Ambulatory Visit: Payer: Self-pay

## 2017-10-16 ENCOUNTER — Emergency Department: Payer: Medicare Other

## 2017-10-16 ENCOUNTER — Emergency Department
Admission: EM | Admit: 2017-10-16 | Discharge: 2017-10-16 | Disposition: A | Discharge status: Home / Self Care | Payer: Medicare Other | Attending: Emergency Medicine | Admitting: Emergency Medicine

## 2017-10-16 ENCOUNTER — Encounter: Payer: Self-pay | Admitting: Emergency Medicine

## 2017-10-16 DIAGNOSIS — I5032 Chronic diastolic (congestive) heart failure: Secondary | ICD-10-CM | POA: Diagnosis not present

## 2017-10-16 DIAGNOSIS — Z992 Dependence on renal dialysis: Secondary | ICD-10-CM | POA: Insufficient documentation

## 2017-10-16 DIAGNOSIS — E1140 Type 2 diabetes mellitus with diabetic neuropathy, unspecified: Secondary | ICD-10-CM | POA: Diagnosis not present

## 2017-10-16 DIAGNOSIS — Z87891 Personal history of nicotine dependence: Secondary | ICD-10-CM | POA: Insufficient documentation

## 2017-10-16 DIAGNOSIS — R0789 Other chest pain: Secondary | ICD-10-CM

## 2017-10-16 DIAGNOSIS — B20 Human immunodeficiency virus [HIV] disease: Secondary | ICD-10-CM | POA: Insufficient documentation

## 2017-10-16 DIAGNOSIS — I132 Hypertensive heart and chronic kidney disease with heart failure and with stage 5 chronic kidney disease, or end stage renal disease: Secondary | ICD-10-CM | POA: Diagnosis not present

## 2017-10-16 DIAGNOSIS — E782 Mixed hyperlipidemia: Secondary | ICD-10-CM | POA: Insufficient documentation

## 2017-10-16 DIAGNOSIS — Z79899 Other long term (current) drug therapy: Secondary | ICD-10-CM | POA: Insufficient documentation

## 2017-10-16 DIAGNOSIS — J449 Chronic obstructive pulmonary disease, unspecified: Secondary | ICD-10-CM | POA: Insufficient documentation

## 2017-10-16 DIAGNOSIS — N186 End stage renal disease: Secondary | ICD-10-CM | POA: Insufficient documentation

## 2017-10-16 DIAGNOSIS — R079 Chest pain, unspecified: Secondary | ICD-10-CM | POA: Diagnosis present

## 2017-10-16 HISTORY — DX: Human immunodeficiency virus (HIV) disease: B20

## 2017-10-16 LAB — BASIC METABOLIC PANEL
Anion gap: 10 (ref 5–15)
BUN: 21 mg/dL — ABNORMAL HIGH (ref 6–20)
CALCIUM: 8.8 mg/dL — AB (ref 8.9–10.3)
CHLORIDE: 102 mmol/L (ref 101–111)
CO2: 28 mmol/L (ref 22–32)
Creatinine, Ser: 4.81 mg/dL — ABNORMAL HIGH (ref 0.44–1.00)
GFR calc non Af Amer: 9 mL/min — ABNORMAL LOW (ref >60)
GFR, EST AFRICAN AMERICAN: 11 mL/min — AB (ref >60)
Glucose, Bld: 86 mg/dL (ref 65–99)
Potassium: 3.7 mmol/L (ref 3.5–5.1)
Sodium: 140 mmol/L (ref 135–145)

## 2017-10-16 LAB — CBC
HCT: 30.5 % — ABNORMAL LOW (ref 35.0–47.0)
HEMOGLOBIN: 9.6 g/dL — AB (ref 12.0–16.0)
MCH: 28.3 pg (ref 26.0–34.0)
MCHC: 31.3 g/dL — AB (ref 32.0–36.0)
MCV: 90.5 fL (ref 80.0–100.0)
Platelets: 340 10*3/uL (ref 150–440)
RBC: 3.38 MIL/uL — ABNORMAL LOW (ref 3.80–5.20)
RDW: 24.9 % — AB (ref 11.5–14.5)
WBC: 7.1 10*3/uL (ref 3.6–11.0)

## 2017-10-16 LAB — TROPONIN I
TROPONIN I: 0.03 ng/mL — AB (ref <0.03)
Troponin I: 0.03 ng/mL (ref <0.03)

## 2017-10-16 MED ORDER — MORPHINE SULFATE (PF) 4 MG/ML IV SOLN
4.0000 mg | Freq: Once | INTRAVENOUS | Status: AC
  Administered 2017-10-16: 4 mg via INTRAVENOUS
  Filled 2017-10-16: qty 1

## 2017-10-16 MED ORDER — ONDANSETRON HCL 4 MG/2ML IJ SOLN
4.0000 mg | Freq: Once | INTRAMUSCULAR | Status: AC
  Administered 2017-10-16: 4 mg via INTRAVENOUS
  Filled 2017-10-16: qty 2

## 2017-10-16 NOTE — Discharge Instructions (Signed)
please use Tylenol as needed for pain. Please follow-up with your regular doctor. Please return for worse pain fever vomiting or feeling sicker.

## 2017-10-16 NOTE — ED Notes (Signed)
Pt left via ems without obtaining signature - discharge instructions had been reviewed with pt prior to ems arrival

## 2017-10-16 NOTE — ED Notes (Signed)
Transported at this time to dialysis

## 2017-10-16 NOTE — ED Notes (Signed)
Date and time results received: 10/16/17 1105  Test: Troponin Critical Value: 0.03  Name of Provider Notified: Dr. Cinda Quest  Orders Received? Or Actions Taken?: Repeat troponin ordered.  Will continue to monitor.

## 2017-10-16 NOTE — ED Triage Notes (Signed)
Pt to ED via [facility] from [facility] for substernal chest pain. Pt reports that she does get chest pain during dialysis. Per EMS EKG WNL. Pt currently on 4 liters of O2, pt chronically on 2-4 Liters. Pt is rating pain 2/10. Pt in NAD at this time.

## 2017-10-16 NOTE — ED Notes (Signed)
Called dialysis to come and de-access pt - they stated that they would be busy for the next 6 hours and to have someone bring the pt to them with 2 stickers and they would de-access her

## 2017-10-16 NOTE — ED Notes (Signed)
Ems will need to be called to transport pt home - Network engineer notified

## 2017-10-16 NOTE — ED Notes (Signed)
Pt has no ride home - she is attempting to call a neighbor - charge is aware and it is possible that we may need to call ems to come and transport her

## 2017-10-16 NOTE — ED Notes (Signed)
Pt ready for discharge - she will be taken to dialysis and de-accessed and then returned to ED room 5 for final discharge

## 2017-10-16 NOTE — ED Provider Notes (Signed)
[facility] Emergency Department Provider Note   ____________________________________________   First MD Initiated Contact with Patient 10/16/17 1002     (approximate)  I have reviewed the triage vital signs and the nursing notes.   HISTORY  Chief Complaint Chest Pain    HPI Cherrish Vitali is a 57 y.o. female who reports she developed chest pain while undergoing dialysis. She reports she gets chest pain like this "once in a blue moon" when she gets dialysis. It sharp and stabbing in the center of her chest reproduced by palpation and deep breathing.   Past Medical History:  Diagnosis Date  . Asthma   . CHF (congestive heart failure) ([place])   . COPD (chronic obstructive pulmonary disease) ([place])   . Diabetes mellitus without complication ([place])   . Diastolic heart failure ([place])   . ESRD (end stage renal disease) on dialysis ([place])   . HIV (human immunodeficiency virus infection) ([place])   . Hypertension   . Neuropathy   . Polycystic kidney disease   . Renal insufficiency     Patient Active Problem List   Diagnosis Date Noted  . CHF (congestive heart failure) ([place]) 09/21/2017  . Accelerated hypertension 09/07/2017  . Lymphedema 05/11/2017  . Diabetes ([place]) 02/10/2017  . Esophageal stricture   . COPD (chronic obstructive pulmonary disease) ([place]) 08/06/2016  . Hypertensive crisis 08/06/2016  . Chronic pain syndrome 06/08/2016  . Anemia 06/08/2016  . Epigastric pain 03/28/2016  . Chronic diastolic heart failure ([place]) 02/04/2016  . Hoarseness of voice 02/04/2016  . Acute on chronic diastolic CHF (congestive heart failure) ([place]) 12/22/2015  . ESRD on dialysis ([place]) 12/22/2015  . Essential hypertension 12/22/2015  . Mixed hyperlipidemia 12/22/2015  . Arm pain, left 05/31/2015  . Closed bilateral ankle fractures, with routine healing, subsequent encounter 07/24/2014  . Closed bilateral ankle fractures, initial encounter 06/20/2014    Past Surgical  History:  Procedure Laterality Date  . ARTERIOVENOUS GRAFT PLACEMENT Right    x3 (R forearm currently used for access)  . BREAST BIOPSY Left 12/03/2015   neg  . COLON SURGERY    . ESOPHAGOGASTRODUODENOSCOPY (EGD) WITH PROPOFOL N/A 02/04/2017   Procedure: ESOPHAGOGASTRODUODENOSCOPY (EGD) WITH PROPOFOL;  Surgeon: Jonathon Bellows, MD;  Location: [facility] ENDOSCOPY;  Service: Endoscopy;  Laterality: N/A;  . PERIPHERAL VASCULAR CATHETERIZATION Right 08/04/2016   Procedure: A/V Shuntogram;  Surgeon: Algernon Huxley, MD;  Location: [facility];  Service: Cardiovascular;  Laterality: Right;  . VEIN HARVEST      Prior to Admission medications   Medication Sig Start Date End Date Taking? Authorizing Provider  acetaminophen (TYLENOL) 325 MG tablet Take 2 tablets (650 mg total) by mouth every 6 (six) hours as needed for mild pain or headache. 03/18/16  Yes Gouru, Aruna, MD  amitriptyline (ELAVIL) 10 MG tablet Take 10 mg by mouth at bedtime as needed for sleep.   Yes [provider]  amLODipine (NORVASC) 5 MG tablet Take 5 mg daily by mouth.  08/31/14  Yes [provider]  aspirin 81 MG chewable tablet Chew 1 tablet (81 mg total) by mouth daily. 01/17/16  Yes Gladstone Lighter, MD  b complex-vitamin c-folic acid (NEPHRO-VITE) 0.8 MG TABS tablet Take 1 tablet by mouth daily.    Yes [provider]  calcium acetate (PHOSLO) 667 MG capsule Take 1 capsule (667 mg total) by mouth 3 (three) times daily with meals. Patient taking differently: Take 1,334 mg by mouth 3 (three) times daily with meals.  02/02/17  Yes Wieting, Richard, MD  cloNIDine (CATAPRES) 0.1 MG tablet Take 1 tablet (0.1 mg total) by mouth 2 (two) times daily. 01/17/16  Yes Gladstone Lighter, MD  epoetin alfa (EPOGEN,PROCRIT) 36144 UNIT/ML injection 10,000 Units 3 (three) times a week.   Yes [provider]  furosemide (LASIX) 80 MG tablet Take 1 tablet (80 mg total) by mouth daily. 03/05/16 11/10/17 Yes Hackney, Otila Kluver A,  FNP  gabapentin (NEURONTIN) 300 MG capsule Take 300 mg 2 (two) times daily by mouth.    Yes [provider]  hydrALAZINE (APRESOLINE) 100 MG tablet Take 1 tablet (100 mg total) by mouth 3 (three) times daily. 09/07/17  Yes Mody, Sital, MD  lidocaine (XYLOCAINE) 5 % ointment Apply 1 application topically daily as needed.    Yes [provider]  losartan (COZAAR) 100 MG tablet Take 1 tablet (100 mg total) by mouth daily. 09/07/17  Yes Mody, Ulice Bold, MD  metoprolol succinate (TOPROL-XL) 50 MG 24 hr tablet Take 50 mg daily by mouth.    Yes [provider]  nitroGLYCERIN (NITROSTAT) 0.4 MG SL tablet Place 1 tablet (0.4 mg total) under the tongue every 5 (five) minutes as needed for chest pain. 01/14/17 09/17/18 Yes Gollan, Kathlene November, MD  omeprazole (PRILOSEC) 20 MG capsule Take 1 capsule by mouth daily. 08/06/17  Yes [provider]  oxyCODONE-acetaminophen (PERCOCET) 10-325 MG tablet Take 1 tablet by mouth daily as needed for pain.   Yes [provider]  protein supplement shake (PREMIER PROTEIN) LIQD Take 2 oz by mouth 3 (three) times a week. Tuesday, Thursday, Saturday   Yes [provider]  VENTOLIN HFA 108 (90 Base) MCG/ACT inhaler Inhale 2 puffs into the lungs daily as needed.  05/21/16  Yes [provider]  diphenhydrAMINE (BENADRYL) 25 mg capsule Take 2 capsules (50 mg total) by mouth every 6 (six) hours as needed. Patient not taking: Reported on 10/01/2017 10/16/16   Carrie Mew, MD  metoCLOPramide (REGLAN) 5 MG tablet Take 1 tablet (5 mg total) by mouth 2 (two) times daily as needed (nausea, vomiting or headache). Patient not taking: Reported on 10/01/2017 08/05/17 08/05/18  Earleen Newport, MD  ondansetron (ZOFRAN) 4 MG tablet Take 1 tablet (4 mg total) by mouth every 8 (eight) hours as needed for nausea or vomiting. Patient not taking: Reported on 10/01/2017 07/21/16   Daymon Larsen, MD  prochlorperazine (COMPAZINE) 10 MG tablet  Take 1 tablet (10 mg total) by mouth every 6 (six) hours as needed for nausea. Patient not taking: Reported on 10/01/2017 10/15/16   Paulette Blanch, MD  SENSIPAR 30 MG tablet Take 1 tablet (30 mg total) by mouth daily. Patient not taking: Reported on 10/16/2017 07/07/16   Fritzi Mandes, MD    Allergies Morphine and related; Strawberry extract; Tramadol; Venofer [iron sucrose]; Buprenorphine hcl; and Morphine  Family History  Problem Relation Age of Onset  . Hypertension Brother   . Heart failure Brother   . Hypertension Mother   . Heart disease Mother     Social History Social History   Tobacco Use  . Smoking status: Former Smoker    Packs/day: 0.25    Last attempt to quit: 08/11/2015    Years since quitting: 2.1  . Smokeless tobacco: Never Used  Substance Use Topics  . Alcohol use: No    Alcohol/week: 0.0 oz  . Drug use: No    Review of Systems  Constitutional: No fever/chills Eyes: No visual changes. ENT: No sore  throat. Cardiovascular: see history of present illness Respiratory: Denies shortness of breath. Gastrointestinal: No abdominal pain.  No nausea, no vomiting.  No diarrhea.  No constipation. Genitourinary: Negative for dysuria. Musculoskeletal: Negative for back pain. Skin: Negative for rash. Neurological: Negative for headaches, focal weakness   ____________________________________________   PHYSICAL EXAM:  VITAL SIGNS: ED Triage Vitals  Enc Vitals Group     BP 10/16/17 1002 (!) 182/95     Pulse Rate 10/16/17 0958 64     Resp 10/16/17 0958 16     Temp 10/16/17 1002 97.6 F (36.4 C)     Temp src --      SpO2 10/16/17 0958 98 %     Weight 10/16/17 0959 179 lb (81.2 kg)     Height --      Head Circumference --      Peak Flow --      Pain Score 10/16/17 0959 5     Pain Loc --      Pain Edu? --      Excl. in [place]? --     Constitutional: Alert and oriented. Well appearing and in no acute distress. Eyes: Conjunctivae are normal.  Head: Atraumatic. Nose:  No congestion/rhinnorhea. Mouth/Throat: Mucous membranes are moist.  Oropharynx non-erythematous. Neck: No stridor.   Cardiovascular: Normal rate, regular rhythm. Grossly normal heart sounds.  Good peripheral circulation.palpation in the center of the chest reproduces her pain. Patient reports reproduces the pain exactly Respiratory: Normal respiratory effort.  No retractions. Lungs CTAB. Gastrointestinal: Soft and nontender. No distention. No abdominal bruits. No CVA tenderness. Musculoskeletal: No lower extremity tenderness nor edema.  No joint effusions. Neurologic:  Normal speech and language. No gross focal neurologic deficits are appreciated.  Skin:  Skin is warm, dry and intact. No rash noted. Psychiatric: Mood and affect are normal. Speech and behavior are normal.  ____________________________________________   LABS (all labs ordered are listed, but only abnormal results are displayed)  Labs Reviewed  BASIC METABOLIC PANEL - Abnormal; Notable for the following components:      Result Value   BUN 21 (*)    Creatinine, Ser 4.81 (*)    Calcium 8.8 (*)    GFR calc non Af Amer 9 (*)    GFR calc Af Amer 11 (*)    All other components within normal limits  CBC - Abnormal; Notable for the following components:   RBC 3.38 (*)    Hemoglobin 9.6 (*)    HCT 30.5 (*)    MCHC 31.3 (*)    RDW 24.9 (*)    All other components within normal limits  TROPONIN I - Abnormal; Notable for the following components:   Troponin I 0.03 (*)    All other components within normal limits  TROPONIN I - Abnormal; Notable for the following components:   Troponin I 0.03 (*)    All other components within normal limits   ____________________________________________  EKG  EKG read and interpreted by me shows a very irregular baseline otherwise normal sinus rhythm rate of 62 normal axis no apparent acute ST-T wave changes and will repeat the EKG and see if the baseline flattens out any. EKG #2 shows  normal sinus rhythm rate of 62 normal axis nonspecific ST-T wave changes and the irregularity in the baseline has resolved.____________________________________________  RADIOLOGY  ED MD interpretation:   Official radiology report(s): Dg Chest 2 View  Result Date: 10/16/2017 CLINICAL DATA:  Chest pain during dialysis. Former smoker. Known COPD. EXAM: CHEST -  2 VIEW COMPARISON:  Chest x-rays dated 09/21/2017, 09/06/2017 and 08/20/2016 FINDINGS: Cardiomegaly is stable. Overall cardiomediastinal silhouette is stable. Interstitial thickening is slightly less prominent on today's study. No pleural effusion or pneumothorax seen no acute or suspicious osseous finding. IMPRESSION: 1. Interstitial thickening is slightly less prominent on today's study indicating improved fluid status. 2. The interstitial prominence is chronic suggesting some degree of underlying chronic interstitial lung disease. 3. Stable cardiomegaly. Electronically Signed   By: Franki Cabot M.D.   On: 10/16/2017 11:04    ____________________________________________   PROCEDURES  Procedure(s) performed:   Procedures  Critical Care performed:   ____________________________________________   INITIAL IMPRESSION / ASSESSMENT AND PLAN / ED COURSE patient's troponins are negative. Patient's pain is now resolved. Patient's pain was consistent with chest wall tenderness. I will let her go follow-up with her regular doctor.      Clinical Course as of Oct 16 1425  Sat Oct 16, 2017  1258 Troponin I: (!!) 0.03 [PM]    Clinical Course User Index [PM] Nena Polio, MD     ____________________________________________   FINAL CLINICAL IMPRESSION(S) / ED DIAGNOSES  Final diagnoses:  Chest wall pain     ED Discharge Orders    None       Note:  This document was prepared using Dragon voice recognition software and may include unintentional dictation errors.    Nena Polio, MD 10/16/17 [phone]

## 2017-10-19 ENCOUNTER — Encounter: Admit: 2017-10-19 | Discharge: 2017-10-19 | Payer: 59 | Attending: Nephrology | Primary: Nephrology

## 2017-10-22 DIAGNOSIS — Z01818 Encounter for other preprocedural examination: Secondary | ICD-10-CM

## 2017-10-22 DIAGNOSIS — Z7682 Awaiting organ transplant status: Principal | ICD-10-CM

## 2017-10-22 DIAGNOSIS — N186 End stage renal disease: Secondary | ICD-10-CM

## 2017-11-08 ENCOUNTER — Inpatient Hospital Stay
Admission: EM | Admit: 2017-11-08 | Discharge: 2017-11-10 | DRG: 640 | Disposition: A | Discharge status: Home / Self Care | Payer: Medicare Other | Attending: Internal Medicine | Admitting: Internal Medicine

## 2017-11-08 ENCOUNTER — Other Ambulatory Visit: Payer: Self-pay

## 2017-11-08 ENCOUNTER — Emergency Department: Payer: Medicare Other

## 2017-11-08 ENCOUNTER — Encounter: Payer: Self-pay | Admitting: Emergency Medicine

## 2017-11-08 DIAGNOSIS — Z9102 Food additives allergy status: Secondary | ICD-10-CM

## 2017-11-08 DIAGNOSIS — Z79891 Long term (current) use of opiate analgesic: Secondary | ICD-10-CM

## 2017-11-08 DIAGNOSIS — Z7982 Long term (current) use of aspirin: Secondary | ICD-10-CM | POA: Diagnosis not present

## 2017-11-08 DIAGNOSIS — Z21 Asymptomatic human immunodeficiency virus [HIV] infection status: Secondary | ICD-10-CM | POA: Diagnosis present

## 2017-11-08 DIAGNOSIS — J449 Chronic obstructive pulmonary disease, unspecified: Secondary | ICD-10-CM | POA: Diagnosis present

## 2017-11-08 DIAGNOSIS — Z87891 Personal history of nicotine dependence: Secondary | ICD-10-CM | POA: Diagnosis not present

## 2017-11-08 DIAGNOSIS — E1122 Type 2 diabetes mellitus with diabetic chronic kidney disease: Secondary | ICD-10-CM | POA: Diagnosis present

## 2017-11-08 DIAGNOSIS — Z888 Allergy status to other drugs, medicaments and biological substances status: Secondary | ICD-10-CM

## 2017-11-08 DIAGNOSIS — I132 Hypertensive heart and chronic kidney disease with heart failure and with stage 5 chronic kidney disease, or end stage renal disease: Secondary | ICD-10-CM | POA: Diagnosis present

## 2017-11-08 DIAGNOSIS — E875 Hyperkalemia: Secondary | ICD-10-CM

## 2017-11-08 DIAGNOSIS — Z9115 Patient's noncompliance with renal dialysis: Secondary | ICD-10-CM

## 2017-11-08 DIAGNOSIS — J810 Acute pulmonary edema: Secondary | ICD-10-CM

## 2017-11-08 DIAGNOSIS — N186 End stage renal disease: Secondary | ICD-10-CM | POA: Diagnosis present

## 2017-11-08 DIAGNOSIS — E1140 Type 2 diabetes mellitus with diabetic neuropathy, unspecified: Secondary | ICD-10-CM | POA: Diagnosis present

## 2017-11-08 DIAGNOSIS — D631 Anemia in chronic kidney disease: Secondary | ICD-10-CM | POA: Diagnosis present

## 2017-11-08 DIAGNOSIS — Q613 Polycystic kidney, unspecified: Secondary | ICD-10-CM | POA: Diagnosis not present

## 2017-11-08 DIAGNOSIS — K219 Gastro-esophageal reflux disease without esophagitis: Secondary | ICD-10-CM | POA: Diagnosis present

## 2017-11-08 DIAGNOSIS — Z992 Dependence on renal dialysis: Secondary | ICD-10-CM | POA: Diagnosis not present

## 2017-11-08 DIAGNOSIS — I5032 Chronic diastolic (congestive) heart failure: Secondary | ICD-10-CM | POA: Diagnosis present

## 2017-11-08 DIAGNOSIS — E8779 Other fluid overload: Secondary | ICD-10-CM

## 2017-11-08 DIAGNOSIS — R0602 Shortness of breath: Secondary | ICD-10-CM | POA: Diagnosis not present

## 2017-11-08 DIAGNOSIS — Z79899 Other long term (current) drug therapy: Secondary | ICD-10-CM

## 2017-11-08 DIAGNOSIS — N2581 Secondary hyperparathyroidism of renal origin: Secondary | ICD-10-CM | POA: Diagnosis present

## 2017-11-08 DIAGNOSIS — J9621 Acute and chronic respiratory failure with hypoxia: Secondary | ICD-10-CM | POA: Diagnosis present

## 2017-11-08 DIAGNOSIS — J9600 Acute respiratory failure, unspecified whether with hypoxia or hypercapnia: Secondary | ICD-10-CM | POA: Diagnosis present

## 2017-11-08 DIAGNOSIS — Z8249 Family history of ischemic heart disease and other diseases of the circulatory system: Secondary | ICD-10-CM | POA: Diagnosis not present

## 2017-11-08 DIAGNOSIS — Z885 Allergy status to narcotic agent status: Secondary | ICD-10-CM | POA: Diagnosis not present

## 2017-11-08 LAB — BLOOD GAS, VENOUS
ACID-BASE EXCESS: 2.2 mmol/L — AB (ref 0.0–2.0)
Bicarbonate: 28.2 mmol/L — ABNORMAL HIGH (ref 20.0–28.0)
DELIVERY SYSTEMS: POSITIVE
FIO2: 30
O2 Saturation: 83.3 %
PH VEN: 7.36 (ref 7.250–7.430)
Patient temperature: 37
pCO2, Ven: 50 mmHg (ref 44.0–60.0)
pO2, Ven: 50 mmHg — ABNORMAL HIGH (ref 32.0–45.0)

## 2017-11-08 LAB — BASIC METABOLIC PANEL
Anion gap: 17 — ABNORMAL HIGH (ref 5–15)
BUN: 78 mg/dL — ABNORMAL HIGH (ref 6–20)
CALCIUM: 8.1 mg/dL — AB (ref 8.9–10.3)
CHLORIDE: 100 mmol/L — AB (ref 101–111)
CO2: 24 mmol/L (ref 22–32)
Creatinine, Ser: 12.94 mg/dL — ABNORMAL HIGH (ref 0.44–1.00)
GFR calc Af Amer: 3 mL/min — ABNORMAL LOW (ref >60)
GFR calc non Af Amer: 3 mL/min — ABNORMAL LOW (ref >60)
GLUCOSE: 103 mg/dL — AB (ref 65–99)
Potassium: 5.9 mmol/L — ABNORMAL HIGH (ref 3.5–5.1)
Sodium: 141 mmol/L (ref 135–145)

## 2017-11-08 LAB — BRAIN NATRIURETIC PEPTIDE: B Natriuretic Peptide: 599 pg/mL — ABNORMAL HIGH (ref 0.0–100.0)

## 2017-11-08 LAB — CBC
HEMATOCRIT: 31 % — AB (ref 35.0–47.0)
Hemoglobin: 9.8 g/dL — ABNORMAL LOW (ref 12.0–16.0)
MCH: 28.8 pg (ref 26.0–34.0)
MCHC: 31.6 g/dL — ABNORMAL LOW (ref 32.0–36.0)
MCV: 91 fL (ref 80.0–100.0)
PLATELETS: 376 10*3/uL (ref 150–440)
RBC: 3.41 MIL/uL — ABNORMAL LOW (ref 3.80–5.20)
RDW: 23.1 % — AB (ref 11.5–14.5)
WBC: 7.8 10*3/uL (ref 3.6–11.0)

## 2017-11-08 LAB — PROTIME-INR
INR: 1.06
PROTHROMBIN TIME: 13.7 s (ref 11.4–15.2)

## 2017-11-08 LAB — TROPONIN I: Troponin I: <0.03 ng/mL (ref <0.03)

## 2017-11-08 MED ORDER — SODIUM CHLORIDE 0.9 % IV SOLN
1.0000 g | Freq: Once | INTRAVENOUS | Status: AC
  Administered 2017-11-08: 1 g via INTRAVENOUS
  Filled 2017-11-08: qty 10

## 2017-11-08 MED ORDER — SODIUM BICARBONATE 8.4 % IV SOLN
25.0000 meq | Freq: Once | INTRAVENOUS | Status: AC
  Administered 2017-11-08: 25 meq via INTRAVENOUS
  Filled 2017-11-08: qty 50

## 2017-11-08 NOTE — ED Triage Notes (Signed)
Pt bib [facility] from home d/t [facility] since yesterday. Dialysis pt, due tomorrow. Pt states only half last treatment d/t machine clotting. Hx COPD. Alert and oriented. 100% cpap

## 2017-11-08 NOTE — ED Provider Notes (Signed)
[facility] Emergency Department Provider Note ____________________________________________   First MD Initiated Contact with Patient 11/08/17 2114     (approximate)  I have reviewed the triage vital signs and the nursing notes.   HISTORY  Chief Complaint Respiratory Distress   HPI Jasmine Holloway is a 57 y.o. female presents for evaluation of shortness of breath  Patient reports she is a dialysis patient, she went to dialysis on Saturday but she reports they were having trouble with the machine and were unable to complete her dialysis run.  This evening she began experiencing increased shortness of breath, she is noticed swelling in her legs and also in her abdomen.  When EMS arrived on scene she was hypoxic with saturations in the mid 80s, they placed her on CPAP and transported for further evaluation.  Patient reports increasing shortness of breath, worse with attempt to lay down.  She reports it feels like she has fluid all over, including her abdomen and lower legs.  No pain.  Reports her breathing is much better after being placed on CPAP.  She has a graft in her right arm, receives dialysis regularly.    Denies any fevers or chills.  Denies productive cough.  She does make a small amount of urine at times, but reports is just a trace.  Past Medical History:  Diagnosis Date  . Asthma   . CHF (congestive heart failure) ([place])   . COPD (chronic obstructive pulmonary disease) ([place])   . Diabetes mellitus without complication ([place])   . Diastolic heart failure ([place])   . ESRD (end stage renal disease) on dialysis ([place])   . HIV (human immunodeficiency virus infection) ([place])   . Hypertension   . Neuropathy   . Polycystic kidney disease   . Renal insufficiency     Patient Active Problem List   Diagnosis Date Noted  . CHF (congestive heart failure) ([place]) 09/21/2017  . Accelerated hypertension 09/07/2017  . Lymphedema 05/11/2017  . Diabetes ([place])  02/10/2017  . Esophageal stricture   . COPD (chronic obstructive pulmonary disease) ([place]) 08/06/2016  . Hypertensive crisis 08/06/2016  . Chronic pain syndrome 06/08/2016  . Anemia 06/08/2016  . Epigastric pain 03/28/2016  . Chronic diastolic heart failure ([place]) 02/04/2016  . Hoarseness of voice 02/04/2016  . Acute on chronic diastolic CHF (congestive heart failure) ([place]) 12/22/2015  . ESRD on dialysis ([place]) 12/22/2015  . Essential hypertension 12/22/2015  . Mixed hyperlipidemia 12/22/2015  . Arm pain, left 05/31/2015  . Closed bilateral ankle fractures, with routine healing, subsequent encounter 07/24/2014  . Closed bilateral ankle fractures, initial encounter 06/20/2014    Past Surgical History:  Procedure Laterality Date  . ABDOMINAL SURGERY    . ARTERIOVENOUS GRAFT PLACEMENT Right    x3 (R forearm currently used for access)  . BREAST BIOPSY Left 12/03/2015   neg  . COLON SURGERY    . ESOPHAGOGASTRODUODENOSCOPY (EGD) WITH PROPOFOL N/A 02/04/2017   Procedure: ESOPHAGOGASTRODUODENOSCOPY (EGD) WITH PROPOFOL;  Surgeon: Jonathon Bellows, MD;  Location: [facility] ENDOSCOPY;  Service: Endoscopy;  Laterality: N/A;  . PERIPHERAL VASCULAR CATHETERIZATION Right 08/04/2016   Procedure: A/V Shuntogram;  Surgeon: Algernon Huxley, MD;  Location: [facility] CV LAB;  Service: Cardiovascular;  Laterality: Right;  . VEIN HARVEST      Prior to Admission medications   Medication Sig Start Date End Date Taking? Authorizing Provider  acetaminophen (TYLENOL) 325 MG tablet Take 2 tablets (650 mg total) by mouth every 6 (six) hours as needed for  mild pain or headache. 03/18/16   Gouru, Illene Silver, MD  amitriptyline (ELAVIL) 10 MG tablet Take 10 mg by mouth at bedtime as needed for sleep.    [provider]  amLODipine (NORVASC) 5 MG tablet Take 5 mg daily by mouth.  08/31/14   [provider]  aspirin 81 MG chewable tablet Chew 1 tablet (81 mg total) by mouth daily. 01/17/16   Gladstone Lighter, MD  b  complex-vitamin c-folic acid (NEPHRO-VITE) 0.8 MG TABS tablet Take 1 tablet by mouth daily.     [provider]  calcium acetate (PHOSLO) 667 MG capsule Take 1 capsule (667 mg total) by mouth 3 (three) times daily with meals. Patient taking differently: Take 1,334 mg by mouth 3 (three) times daily with meals.  02/02/17   Loletha Grayer, MD  cloNIDine (CATAPRES) 0.1 MG tablet Take 1 tablet (0.1 mg total) by mouth 2 (two) times daily. 01/17/16   Gladstone Lighter, MD  diphenhydrAMINE (BENADRYL) 25 mg capsule Take 2 capsules (50 mg total) by mouth every 6 (six) hours as needed. Patient not taking: Reported on 10/01/2017 10/16/16   Carrie Mew, MD  epoetin alfa (EPOGEN,PROCRIT) 94174 UNIT/ML injection 10,000 Units 3 (three) times a week.    [provider]  furosemide (LASIX) 80 MG tablet Take 1 tablet (80 mg total) by mouth daily. 03/05/16 11/10/17  Alisa Graff, FNP  gabapentin (NEURONTIN) 300 MG capsule Take 300 mg 2 (two) times daily by mouth.     [provider]  hydrALAZINE (APRESOLINE) 100 MG tablet Take 1 tablet (100 mg total) by mouth 3 (three) times daily. 09/07/17   Bettey Costa, MD  lidocaine (XYLOCAINE) 5 % ointment Apply 1 application topically daily as needed.     [provider]  losartan (COZAAR) 100 MG tablet Take 1 tablet (100 mg total) by mouth daily. 09/07/17   Bettey Costa, MD  metoCLOPramide (REGLAN) 5 MG tablet Take 1 tablet (5 mg total) by mouth 2 (two) times daily as needed (nausea, vomiting or headache). Patient not taking: Reported on 10/01/2017 08/05/17 08/05/18  Earleen Newport, MD  metoprolol succinate (TOPROL-XL) 50 MG 24 hr tablet Take 50 mg daily by mouth.     [provider]  nitroGLYCERIN (NITROSTAT) 0.4 MG SL tablet Place 1 tablet (0.4 mg total) under the tongue every 5 (five) minutes as needed for chest pain. 01/14/17 09/17/18  Minna Merritts, MD  omeprazole (PRILOSEC) 20 MG capsule Take 1 capsule by mouth daily.  08/06/17   [provider]  ondansetron (ZOFRAN) 4 MG tablet Take 1 tablet (4 mg total) by mouth every 8 (eight) hours as needed for nausea or vomiting. Patient not taking: Reported on 10/01/2017 07/21/16   Daymon Larsen, MD  oxyCODONE-acetaminophen (PERCOCET) 10-325 MG tablet Take 1 tablet by mouth daily as needed for pain.    [provider]  prochlorperazine (COMPAZINE) 10 MG tablet Take 1 tablet (10 mg total) by mouth every 6 (six) hours as needed for nausea. Patient not taking: Reported on 10/01/2017 10/15/16   Paulette Blanch, MD  protein supplement shake (PREMIER PROTEIN) LIQD Take 2 oz by mouth 3 (three) times a week. Tuesday, Thursday, Saturday    [provider]  SENSIPAR 30 MG tablet Take 1 tablet (30 mg total) by mouth daily. Patient not taking: Reported on 10/16/2017 07/07/16   Fritzi Mandes, MD  VENTOLIN HFA 108 [phone] Base) MCG/ACT inhaler Inhale 2 puffs into the lungs daily as needed.  05/21/16  [provider]    Allergies Morphine and related; Strawberry extract; Tramadol; Venofer [iron sucrose]; Buprenorphine hcl; and Morphine  Family History  Problem Relation Age of Onset  . Hypertension Brother   . Heart failure Brother   . Hypertension Mother   . Heart disease Mother     Social History Social History   Tobacco Use  . Smoking status: Former Smoker    Packs/day: 0.25    Last attempt to quit: 08/11/2015    Years since quitting: 2.2  . Smokeless tobacco: Never Used  Substance Use Topics  . Alcohol use: No    Alcohol/week: 0.0 oz  . Drug use: No    Review of Systems Constitutional: No fever/chills Eyes: No visual changes. ENT: No sore throat. Cardiovascular: Denies chest pain. Respiratory: See HPI  gastrointestinal: No abdominal pain.  No nausea, no vomiting.  Feels swollen like fluid genitourinary: Negative for dysuria. Musculoskeletal: Negative for back pain.  Lower leg swelling in both legs Skin: Negative for  rash. Neurological: Negative for headaches, focal weakness or numbness.    ____________________________________________   PHYSICAL EXAM:  VITAL SIGNS: ED Triage Vitals  Enc Vitals Group     BP 11/08/17 2115 (!) 148/83     Pulse Rate 11/08/17 2115 66     Resp 11/08/17 2115 17     Temp 11/08/17 2115 98.2 F (36.8 C)     Temp Source 11/08/17 2115 Axillary     SpO2 11/08/17 2108 100 %     Weight 11/08/17 2128 180 lb (81.6 kg)     Height 11/08/17 2128 5\' 3"  (1.6 m)     Head Circumference --      Peak Flow --      Pain Score 11/08/17 2127 9     Pain Loc --      Pain Edu? --      Excl. in [place]? --     Constitutional: Alert and oriented. Well appearing mild increased work of breathing tolerating CPAP very well. Eyes: Conjunctivae are normal. Head: Atraumatic. Nose: No congestion/rhinnorhea. Mouth/Throat: Mucous membranes are moist. Neck: No stridor.   Cardiovascular: Normal rate, regular rhythm. Grossly normal heart sounds.  Good peripheral circulation.  Patient has some mild crackles in the lower bases bilateral. Respiratory: Mild accessory muscle use, speaks in short phrases, 1-3 words.  She is tolerating CPAP well, not hypoxic at present on CPAP.  Mild increased work of breathing Gastrointestinal: Soft and nontender. No distention. Musculoskeletal: No lower extremity tenderness but has 2+ bilateral lower extremity pitting edema Neurologic:  Normal speech and language. No gross focal neurologic deficits are appreciated.  Skin:  Skin is warm, dry and intact. No rash noted. Psychiatric: Mood and affect are normal. Speech and behavior are normal.  ____________________________________________   LABS (all labs ordered are listed, but only abnormal results are displayed)  Labs Reviewed  CBC - Abnormal; Notable for the following components:      Result Value   RBC 3.41 (*)    Hemoglobin 9.8 (*)    HCT 31.0 (*)    MCHC 31.6 (*)    RDW 23.1 (*)    All other components within  normal limits  BRAIN NATRIURETIC PEPTIDE - Abnormal; Notable for the following components:   B Natriuretic Peptide 599.0 (*)    All other components within normal limits  BASIC METABOLIC PANEL - Abnormal; Notable for the following components:   Potassium 5.9 (*)    Chloride 100 (*)    Glucose, Bld  103 (*)    BUN 78 (*)    Creatinine, Ser 12.94 (*)    Calcium 8.1 (*)    GFR calc non Af Amer 3 (*)    GFR calc Af Amer 3 (*)    Anion gap 17 (*)    All other components within normal limits  BLOOD GAS, VENOUS - Abnormal; Notable for the following components:   pO2, Ven 50.0 (*)    Bicarbonate 28.2 (*)    Acid-Base Excess 2.2 (*)    All other components within normal limits  TROPONIN I  PROTIME-INR   ____________________________________________  EKG  Reviewed enterotomy at 2115 Heart rate 69 QRS 100 QTC 465 Normal sinus rhythm, probable left ventricular hypertrophy.  No evidence of acute ischemia denoted ____________________________________________  RADIOLOGY    Chest x-ray reviewed, cardiomegaly with mild edema. ____________________________________________   PROCEDURES  Procedure(s) performed: None  Procedures  Critical Care performed: Yes, see critical care note(s)  CRITICAL CARE Performed by: Delman Kitten   Total critical care time: 35 minutes  Critical care time was exclusive of separately billable procedures and treating other patients.  Critical care was necessary to treat or prevent imminent or life-threatening deterioration.  Critical care was time spent personally by me on the following activities: development of treatment plan with patient and/or surrogate as well as nursing, discussions with consultants, evaluation of patient's response to treatment, examination of patient, obtaining history from patient or surrogate, ordering and performing treatments and interventions, ordering and review of laboratory studies, ordering and review of radiographic  studies, pulse oximetry and re-evaluation of patient's condition.  Respiratory distress, hypoxia and what appears to be likely volume overload being managed with BiPAP at present. ____________________________________________   INITIAL IMPRESSION / ASSESSMENT AND PLAN / ED COURSE  Pertinent labs & imaging results that were available during my care of the patient were reviewed by me and considered in my medical decision making (see chart for details).    Patient resents for shortness of breath, reports increased swelling in her legs and was unable to complete full dialysis session due to which she reports is a malfunction with the machine as he was running on Saturday.  Right arm demonstrates good thrill and there is no obvious evidence of any complication about her dialysis graft.  She certainly demonstrates edema in the lower extremities, crackles in the lungs, and denies infectious symptoms.  Her presentation chest x-ray and initial presentation with significant hypertension blood pressure of nearly 190 suggestive of pulmonary edema.  Clinical Course as of Nov 08 2301  Mon Nov 08, 2017  2201 Nephrology paged for stat dialysis consideration.    [MQ]  2249 3rd page out, called answering service and notified house supervisor (re: contacting Dr. Abigail Butts). Also called Dr. Holley Raring (not on call) who is trying to get a hold of the patient.    [MQ]    Clinical Course User Index [MQ] Delman Kitten, MD   ----------------------------------------- 10:08 PM on 11/08/2017 -----------------------------------------  Patient reevaluated.  She is tolerating BiPAP very well.  Resting comfortably her blood pressure has improved to 148/83, respiratory rate is improved, her oxygen saturation is normal.  She reports feeling much better, tolerating well.  Await callback from nephrology.  Was considering blood pressure lowering and nitrates, but at present she is tolerating BiPAP extremely well, her blood pressure  is improved on its own, do not wish to risk lowering her blood pressure too quickly given her initial presentation and blood pressure of almost 190  and EMS care.  ----------------------------------------- 11:03 PM on 11/08/2017 -----------------------------------------  Case discussed with Dr. Abigail Butts, is calling the dialysis nurse to set up dialysis.  Recommends no additional medication changes, advises continue the patient on respiratory support/BiPAP and he will work to get the dialysis team and and advises admission to the hospital in the interim. ____________________________________________   FINAL CLINICAL IMPRESSION(S) / ED DIAGNOSES  Final diagnoses:  Hyperkalemia  Pulmonary edema, acute (HCC)  Cardiac volume overload      NEW MEDICATIONS STARTED DURING THIS VISIT:  New Prescriptions   No medications on file     Note:  This document was prepared using Dragon voice recognition software and may include unintentional dictation errors.     Delman Kitten, MD 11/08/17 2303

## 2017-11-08 NOTE — H&P (Signed)
[facility] [place] NAME: Jasmine Holloway    MR#:  [id number]  DATE OF BIRTH:  January 23, 1961  DATE OF ADMISSION:  11/08/2017  PRIMARY CARE PHYSICIAN: Alene Mires Elyse Jarvis, MD   REQUESTING/REFERRING PHYSICIAN:   CHIEF COMPLAINT:   Chief Complaint  Patient presents with  . Respiratory Distress    HISTORY OF PRESENT ILLNESS: Jasmine Holloway  is a 57 y.o. female with a known history of CHF, COPD, diabetes, end-stage renal disease and other comorbidities. Patient was brought to emergency room for acute onset of shortness of breath and lower extremities edema started in the past 24 hours, gradually getting worse.  Her shortness of breath is worse when laying down flat.  No chest pain or fever.  Per patient, her last hemodialysis session was incomplete, due to technical difficulties at the dialysis center.  Her oxygen saturation was 82% on room air, when paramedics arrived at her house.  She currently is more comfortable on BiPAP, in the emergency room. Blood test done emergency room are notable for creatinine level at 12.94.  WBC is 7.8.  Troponin level is within normal limits. Chest x-ray.  Reviewed by myself.  Shows perihilar edema.  EKG is noted without any acute ST-T changes. Patient is admitted for emergency hemodialysis.   PAST MEDICAL HISTORY:   Past Medical History:  Diagnosis Date  . Asthma   . CHF (congestive heart failure) ([place])   . COPD (chronic obstructive pulmonary disease) ([place])   . Diabetes mellitus without complication ([place])   . Diastolic heart failure ([place])   . ESRD (end stage renal disease) on dialysis ([place])   . HIV (human immunodeficiency virus infection) ([place])   . Hypertension   . Neuropathy   . Polycystic kidney disease   . Renal insufficiency     PAST SURGICAL HISTORY:  Past Surgical History:  Procedure Laterality Date  . ABDOMINAL SURGERY    . ARTERIOVENOUS GRAFT PLACEMENT Right    x3 (R forearm currently used  for access)  . BREAST BIOPSY Left 12/03/2015   neg  . COLON SURGERY    . ESOPHAGOGASTRODUODENOSCOPY (EGD) WITH PROPOFOL N/A 02/04/2017   Procedure: ESOPHAGOGASTRODUODENOSCOPY (EGD) WITH PROPOFOL;  Surgeon: Jonathon Bellows, MD;  Location: [facility] ENDOSCOPY;  Service: Endoscopy;  Laterality: N/A;  . PERIPHERAL VASCULAR CATHETERIZATION Right 08/04/2016   Procedure: A/V Shuntogram;  Surgeon: Algernon Huxley, MD;  Location: [facility] CV LAB;  Service: Cardiovascular;  Laterality: Right;  . VEIN HARVEST      SOCIAL HISTORY:  Social History   Tobacco Use  . Smoking status: Former Smoker    Packs/day: 0.25    Last attempt to quit: 08/11/2015    Years since quitting: 2.2  . Smokeless tobacco: Never Used  Substance Use Topics  . Alcohol use: No    Alcohol/week: 0.0 oz    FAMILY HISTORY:  Family History  Problem Relation Age of Onset  . Hypertension Brother   . Heart failure Brother   . Hypertension Mother   . Heart disease Mother     DRUG ALLERGIES:  Allergies  Allergen Reactions  . Morphine And Related Nausea And Vomiting  . Strawberry Extract   . Tramadol Nausea And Vomiting  . Venofer [Iron Sucrose]   . Buprenorphine Hcl Nausea And Vomiting  . Morphine Nausea And Vomiting    Halllucinations    REVIEW OF SYSTEMS:   CONSTITUTIONAL: No fever, fatigue or weakness.  EYES: No blurred or double vision.  EARS, NOSE, AND THROAT: No tinnitus or ear pain.  RESPIRATORY: Positive for shortness of breath.  No cough, wheezing or hemoptysis.  CARDIOVASCULAR: No chest pain, orthopnea, edema.  GASTROINTESTINAL: No nausea, vomiting, diarrhea or abdominal pain.  GENITOURINARY: No dysuria, hematuria.  ENDOCRINE: No polyuria, nocturia,  HEMATOLOGY: No anemia, easy bruising or bleeding SKIN: No rash or lesion. MUSCULOSKELETAL: No joint pain or arthritis.   NEUROLOGIC: No tingling, numbness, weakness.  PSYCHIATRY: No anxiety or depression.   MEDICATIONS AT HOME:  Prior to Admission medications    Medication Sig Start Date End Date Taking? Authorizing Provider  acetaminophen (TYLENOL) 325 MG tablet Take 2 tablets (650 mg total) by mouth every 6 (six) hours as needed for mild pain or headache. 03/18/16  Yes Gouru, Aruna, MD  amitriptyline (ELAVIL) 10 MG tablet Take 10 mg by mouth at bedtime as needed for sleep.   Yes [provider]  amLODipine (NORVASC) 5 MG tablet Take 5 mg daily by mouth.  08/31/14  Yes [provider]  aspirin 81 MG chewable tablet Chew 1 tablet (81 mg total) by mouth daily. 01/17/16  Yes Gladstone Lighter, MD  b complex-vitamin c-folic acid (NEPHRO-VITE) 0.8 MG TABS tablet Take 1 tablet by mouth daily.    Yes [provider]  calcium acetate (PHOSLO) 667 MG capsule Take 1 capsule (667 mg total) by mouth 3 (three) times daily with meals. Patient taking differently: Take 1,334 mg by mouth 3 (three) times daily with meals.  02/02/17  Yes Wieting, Richard, MD  cloNIDine (CATAPRES) 0.1 MG tablet Take 1 tablet (0.1 mg total) by mouth 2 (two) times daily. 01/17/16  Yes Gladstone Lighter, MD  epoetin alfa (EPOGEN,PROCRIT) 78242 UNIT/ML injection 10,000 Units 3 (three) times a week.   Yes [provider]  furosemide (LASIX) 80 MG tablet Take 1 tablet (80 mg total) by mouth daily. 03/05/16 11/10/17 Yes Hackney, Otila Kluver A, FNP  gabapentin (NEURONTIN) 300 MG capsule Take 300 mg 2 (two) times daily by mouth.    Yes [provider]  hydrALAZINE (APRESOLINE) 100 MG tablet Take 1 tablet (100 mg total) by mouth 3 (three) times daily. 09/07/17  Yes Mody, Sital, MD  lidocaine (XYLOCAINE) 5 % ointment Apply 1 application topically daily as needed.    Yes [provider]  losartan (COZAAR) 100 MG tablet Take 1 tablet (100 mg total) by mouth daily. 09/07/17  Yes Mody, Ulice Bold, MD  metoprolol succinate (TOPROL-XL) 50 MG 24 hr tablet Take 50 mg daily by mouth.    Yes [provider]  nitroGLYCERIN (NITROSTAT) 0.4 MG SL tablet Place 1 tablet (0.4 mg  total) under the tongue every 5 (five) minutes as needed for chest pain. 01/14/17 09/17/18 Yes Gollan, Kathlene November, MD  omeprazole (PRILOSEC) 20 MG capsule Take 1 capsule by mouth daily. 08/06/17  Yes [provider]  ondansetron (ZOFRAN) 4 MG tablet Take 1 tablet (4 mg total) by mouth every 8 (eight) hours as needed for nausea or vomiting. 07/21/16  Yes Daymon Larsen, MD  oxyCODONE-acetaminophen (PERCOCET) 10-325 MG tablet Take 1 tablet by mouth daily as needed for pain.   Yes [provider]  VENTOLIN HFA 108 (90 Base) MCG/ACT inhaler Inhale 2 puffs into the lungs daily as needed.  05/21/16  Yes [provider]  diphenhydrAMINE (BENADRYL) 25 mg capsule Take 2 capsules (50 mg total) by mouth every 6 (six) hours as needed. Patient not taking: Reported on 10/01/2017 10/16/16   Carrie Mew, MD  metoCLOPramide [facility]) 5  MG tablet Take 1 tablet (5 mg total) by mouth 2 (two) times daily as needed (nausea, vomiting or headache). Patient not taking: Reported on 10/01/2017 08/05/17 08/05/18  Earleen Newport, MD  prochlorperazine (COMPAZINE) 10 MG tablet Take 1 tablet (10 mg total) by mouth every 6 (six) hours as needed for nausea. Patient not taking: Reported on 10/01/2017 10/15/16   Paulette Blanch, MD  protein supplement shake (PREMIER PROTEIN) LIQD Take 2 oz by mouth 3 (three) times a week. Tuesday, Thursday, Saturday    [provider]  SENSIPAR 30 MG tablet Take 1 tablet (30 mg total) by mouth daily. Patient not taking: Reported on 10/16/2017 07/07/16   Fritzi Mandes, MD      PHYSICAL EXAMINATION:   VITAL SIGNS: Blood pressure (!) 148/79, pulse (!) 54, temperature 98.2 F (36.8 C), temperature source Axillary, resp. rate 16, height 5\' 3"  (1.6 m), weight 81.6 kg (180 lb), SpO2 98 %.  GENERAL:  57 y.o.-year-old patient lying in the bed with no acute distress.  EYES: Pupils equal, round, reactive to light and accommodation. No scleral icterus.  HEENT: Head atraumatic,  normocephalic. Oropharynx and nasopharynx clear.  NECK:  Supple, no jugular venous distention. No thyroid enlargement, no tenderness.  LUNGS: Reduced breath sounds bilaterally, no wheezing. No use of accessory muscles of respiration.  CARDIOVASCULAR: S1, S2 normal. No S3/S4.  ABDOMEN: Soft, nontender, nondistended. Bowel sounds present. No organomegaly or mass.  EXTREMITIES: Bilateral 2+ pitting pedal edema, up to the knees, noted.  NEUROLOGIC: No focal weakness.  Gait not checked, as patient is on BiPAP.  PSYCHIATRIC: The patient is alert and oriented x 3.  SKIN: No obvious rash, lesion, or ulcer.   LABORATORY PANEL:   CBC Recent Labs  Lab 11/08/17 2113  WBC 7.8  HGB 9.8*  HCT 31.0*  PLT 376  MCV 91.0  MCH 28.8  MCHC 31.6*  RDW 23.1*   ------------------------------------------------------------------------------------------------------------------  Chemistries  Recent Labs  Lab 11/08/17 2113  NA 141  K 5.9*  CL 100*  CO2 24  GLUCOSE 103*  BUN 78*  CREATININE 12.94*  CALCIUM 8.1*   ------------------------------------------------------------------------------------------------------------------ estimated creatinine clearance is 4.8 mL/min (A) (by C-G formula based on SCr of 12.94 mg/dL (H)). ------------------------------------------------------------------------------------------------------------------ No results for input(s): TSH, T4TOTAL, T3FREE, THYROIDAB in the last 72 hours.  Invalid input(s): FREET3   Coagulation profile Recent Labs  Lab 11/08/17 2113  INR 1.06   ------------------------------------------------------------------------------------------------------------------- No results for input(s): DDIMER in the last 72 hours. -------------------------------------------------------------------------------------------------------------------  Cardiac Enzymes Recent Labs  Lab 11/08/17 2113  TROPONINI <0.03    ------------------------------------------------------------------------------------------------------------------ Invalid input(s): POCBNP  ---------------------------------------------------------------------------------------------------------------  Urinalysis    Component Value Date/Time   COLORURINE YELLOW (A) 07/05/2017 1539   APPEARANCEUR TURBID (A) 07/05/2017 1539   LABSPEC 1.008 07/05/2017 1539   PHURINE 9.0 (H) 07/05/2017 1539   GLUCOSEU 50 (A) 07/05/2017 1539   HGBUR NEGATIVE 07/05/2017 1539   BILIRUBINUR NEGATIVE 07/05/2017 1539   KETONESUR NEGATIVE 07/05/2017 1539   PROTEINUR 100 (A) 07/05/2017 1539   NITRITE NEGATIVE 07/05/2017 1539   LEUKOCYTESUR MODERATE (A) 07/05/2017 1539     RADIOLOGY: Dg Chest Portable 1 View  Result Date: 11/08/2017 CLINICAL DATA:  Shortness of breath EXAM: PORTABLE CHEST 1 VIEW COMPARISON:  10/16/2017 FINDINGS: Cardiomegaly with mild perihilar edema. No definite pleural effusions. No pneumothorax. Surgical clips in the left chest wall/axilla. IMPRESSION: Cardiomegaly with mild perihilar edema. Electronically Signed   By: Julian Hy M.D.   On: 11/08/2017 21:51    EKG:  Orders placed or performed during the hospital encounter of 11/08/17  . EKG 12-Lead  . EKG 12-Lead  . ED EKG  . ED EKG    IMPRESSION AND PLAN:   1.  Acute hypoxemic respiratory failure, secondary to acute pulmonary edema.  Continue BiPAP, while waiting for emergency hemodialysis. 2.  Acute pulmonary edema, secondary to fluid overload, from missing hemodialysis session.  Nephrology is consulted and patient would undergo emergency hemodialysis tonight.  Continue BiPAP support for now. 3. ESRD, on HD.  Continue management per nephrology. 4.  Hypertension, stable, will restart home medications.  All the records are reviewed and case discussed with ED provider. Management plans discussed with the patient, family and they are in agreement.  CODE STATUS: Code  Status History    Date Active Date Inactive Code Status Order ID Comments User Context   09/21/2017 0822 09/22/2017 1855 Full Code [id number]  Hillary Bow, MD ED   09/07/2017 0230 09/07/2017 1908 Full Code [id number]  Lance Coon, MD Inpatient   06/15/2017 1946 06/17/2017 1843 Full Code [id number]  Dustin Flock, MD Inpatient   02/01/2017 0747 02/02/2017 1740 Full Code [id number]  Loletha Grayer, MD ED   11/10/2016 0821 11/11/2016 1754 Full Code [id number]  Hugelmeyer, Alexis, DO Inpatient   07/04/2016 0509 07/07/2016 2031 Full Code [id number]  Mikael Spray, NP ED   06/09/2016 0458 06/10/2016 1932 Full Code [id number]  Awilda Bill, NP ED   05/11/2016 1024 05/12/2016 1826 Full Code [id number]  Vaughan Basta, MD Inpatient   04/28/2016 2030 04/30/2016 1749 Full Code [id number]  Gladstone Lighter, MD Inpatient   03/28/2016 1825 03/29/2016 1726 Full Code [id number]  Vaughan Basta, MD ED   03/17/2016 0606 03/18/2016 0751 Full Code [id number]  Saundra Shelling, MD ED   01/10/2016 2111 01/17/2016 2136 Full Code [id number]  Mikael Spray, NP ED   12/21/2015 0608 12/22/2015 1609 Full Code [id number]  Saundra Shelling, MD Inpatient   02/24/2015 0352 02/24/2015 1615 Full Code [id number]  Harrie Foreman, MD Inpatient       TOTAL TIME TAKING CARE OF THIS PATIENT: 45 minutes.    Amelia Jo M.D on 11/08/2017 at 11:54 PM  Between 7am to 6pm - Pager - [phone]  After 6pm go to [email or web address] - password [email or web address] [facility]  Office  [phone]  CC: Primary care physician; Theotis Burrow, MD

## 2017-11-09 ENCOUNTER — Other Ambulatory Visit: Payer: Self-pay

## 2017-11-09 DIAGNOSIS — R0602 Shortness of breath: Secondary | ICD-10-CM

## 2017-11-09 LAB — BASIC METABOLIC PANEL
Anion gap: 17 — ABNORMAL HIGH (ref 5–15)
BUN: 85 mg/dL — AB (ref 6–20)
CHLORIDE: 102 mmol/L (ref 101–111)
CO2: 25 mmol/L (ref 22–32)
Calcium: 8.2 mg/dL — ABNORMAL LOW (ref 8.9–10.3)
Creatinine, Ser: 14.43 mg/dL — ABNORMAL HIGH (ref 0.44–1.00)
GFR calc non Af Amer: 2 mL/min — ABNORMAL LOW (ref >60)
GFR, EST AFRICAN AMERICAN: 3 mL/min — AB (ref >60)
Glucose, Bld: 104 mg/dL — ABNORMAL HIGH (ref 65–99)
Potassium: 5.9 mmol/L — ABNORMAL HIGH (ref 3.5–5.1)
SODIUM: 144 mmol/L (ref 135–145)

## 2017-11-09 LAB — CBC
HCT: 28.9 % — ABNORMAL LOW (ref 35.0–47.0)
Hemoglobin: 9.2 g/dL — ABNORMAL LOW (ref 12.0–16.0)
MCH: 29.1 pg (ref 26.0–34.0)
MCHC: 31.9 g/dL — ABNORMAL LOW (ref 32.0–36.0)
MCV: 91.4 fL (ref 80.0–100.0)
Platelets: 330 10*3/uL (ref 150–440)
RBC: 3.16 MIL/uL — AB (ref 3.80–5.20)
RDW: 22.9 % — ABNORMAL HIGH (ref 11.5–14.5)
WBC: 6.3 10*3/uL (ref 3.6–11.0)

## 2017-11-09 LAB — MRSA PCR SCREENING: MRSA by PCR: NEGATIVE

## 2017-11-09 LAB — GLUCOSE, CAPILLARY
Glucose-Capillary: 91 mg/dL (ref 65–99)
Glucose-Capillary: 82 mg/dL (ref 65–99)

## 2017-11-09 LAB — POTASSIUM: POTASSIUM: 4.2 mmol/L (ref 3.5–5.1)

## 2017-11-09 MED ORDER — TRAZODONE HCL 50 MG PO TABS: 25.0000 mg | ORAL_TABLET | Freq: Every evening | ORAL | Status: DC | PRN

## 2017-11-09 MED ORDER — NITROGLYCERIN 0.4 MG SL SUBL: 0.4000 mg | SUBLINGUAL_TABLET | SUBLINGUAL | Status: DC | PRN

## 2017-11-09 MED ORDER — ONDANSETRON HCL 4 MG PO TABS: 4.0000 mg | ORAL_TABLET | Freq: Four times a day (QID) | ORAL | Status: DC | PRN

## 2017-11-09 MED ORDER — AMLODIPINE BESYLATE 5 MG PO TABS
5.0000 mg | ORAL_TABLET | Freq: Every day | ORAL | Status: DC
  Administered 2017-11-09 – 2017-11-10 (×2): 5 mg via ORAL
  Filled 2017-11-09 (×2): qty 1

## 2017-11-09 MED ORDER — CALCIUM ACETATE (PHOS BINDER) 667 MG PO CAPS
1334.0000 mg | ORAL_CAPSULE | Freq: Three times a day (TID) | ORAL | Status: DC
  Administered 2017-11-09 – 2017-11-10 (×4): 1334 mg via ORAL
  Filled 2017-11-09 (×6): qty 2

## 2017-11-09 MED ORDER — FUROSEMIDE 40 MG PO TABS
80.0000 mg | ORAL_TABLET | Freq: Every day | ORAL | Status: DC
  Administered 2017-11-09 – 2017-11-10 (×2): 80 mg via ORAL
  Filled 2017-11-09: qty 4
  Filled 2017-11-09: qty 2

## 2017-11-09 MED ORDER — RENA-VITE PO TABS
1.0000 | ORAL_TABLET | Freq: Every day | ORAL | Status: DC
  Administered 2017-11-09 (×2): 1 via ORAL
  Filled 2017-11-09 (×2): qty 1

## 2017-11-09 MED ORDER — PREMIER PROTEIN SHAKE
2.0000 [oz_av] | ORAL | Status: DC
  Administered 2017-11-10: 2 [oz_av] via ORAL

## 2017-11-09 MED ORDER — ACETAMINOPHEN 325 MG PO TABS: 650.0000 mg | ORAL_TABLET | Freq: Four times a day (QID) | ORAL | Status: DC | PRN

## 2017-11-09 MED ORDER — METOPROLOL SUCCINATE ER 50 MG PO TB24
50.0000 mg | ORAL_TABLET | Freq: Every day | ORAL | Status: DC
  Administered 2017-11-09 – 2017-11-10 (×2): 50 mg via ORAL
  Filled 2017-11-09 (×2): qty 1

## 2017-11-09 MED ORDER — AMITRIPTYLINE HCL 10 MG PO TABS
10.0000 mg | ORAL_TABLET | Freq: Every evening | ORAL | Status: DC | PRN
  Filled 2017-11-09: qty 1

## 2017-11-09 MED ORDER — GABAPENTIN 300 MG PO CAPS
300.0000 mg | ORAL_CAPSULE | Freq: Two times a day (BID) | ORAL | Status: DC
  Administered 2017-11-09 – 2017-11-10 (×4): 300 mg via ORAL
  Filled 2017-11-09 (×4): qty 1

## 2017-11-09 MED ORDER — HEPARIN SODIUM (PORCINE) 5000 UNIT/ML IJ SOLN
5000.0000 [IU] | Freq: Three times a day (TID) | INTRAMUSCULAR | Status: DC
  Administered 2017-11-09 – 2017-11-10 (×4): 5000 [IU] via SUBCUTANEOUS
  Filled 2017-11-09 (×4): qty 1

## 2017-11-09 MED ORDER — CLONIDINE HCL 0.1 MG PO TABS
0.1000 mg | ORAL_TABLET | Freq: Two times a day (BID) | ORAL | Status: DC
  Administered 2017-11-09 – 2017-11-10 (×4): 0.1 mg via ORAL
  Filled 2017-11-09 (×3): qty 1

## 2017-11-09 MED ORDER — ASPIRIN 81 MG PO CHEW
81.0000 mg | CHEWABLE_TABLET | Freq: Every day | ORAL | Status: DC
  Administered 2017-11-09 – 2017-11-10 (×2): 81 mg via ORAL
  Filled 2017-11-09 (×2): qty 1

## 2017-11-09 MED ORDER — ONDANSETRON HCL 4 MG/2ML IJ SOLN: 4.0000 mg | Freq: Four times a day (QID) | INTRAMUSCULAR | Status: DC | PRN

## 2017-11-09 MED ORDER — HYDRALAZINE HCL 50 MG PO TABS
100.0000 mg | ORAL_TABLET | Freq: Three times a day (TID) | ORAL | Status: DC
  Administered 2017-11-09 – 2017-11-10 (×3): 100 mg via ORAL
  Filled 2017-11-09 (×3): qty 2

## 2017-11-09 MED ORDER — ORAL CARE MOUTH RINSE: 15.0000 mL | Freq: Two times a day (BID) | OROMUCOSAL | Status: DC

## 2017-11-09 MED ORDER — LOSARTAN POTASSIUM 50 MG PO TABS
100.0000 mg | ORAL_TABLET | Freq: Every day | ORAL | Status: DC
  Administered 2017-11-09 – 2017-11-10 (×2): 100 mg via ORAL
  Filled 2017-11-09 (×2): qty 2

## 2017-11-09 MED ORDER — PANTOPRAZOLE SODIUM 40 MG PO TBEC
40.0000 mg | DELAYED_RELEASE_TABLET | Freq: Every day | ORAL | Status: DC
  Administered 2017-11-09 – 2017-11-10 (×2): 40 mg via ORAL
  Filled 2017-11-09 (×2): qty 1

## 2017-11-09 MED ORDER — DOCUSATE SODIUM 100 MG PO CAPS
100.0000 mg | ORAL_CAPSULE | Freq: Two times a day (BID) | ORAL | Status: DC
  Administered 2017-11-09 – 2017-11-10 (×3): 100 mg via ORAL
  Filled 2017-11-09 (×4): qty 1

## 2017-11-09 MED ORDER — BISACODYL 5 MG PO TBEC: 5.0000 mg | DELAYED_RELEASE_TABLET | Freq: Every day | ORAL | Status: DC | PRN

## 2017-11-09 MED ORDER — ACETAMINOPHEN 650 MG RE SUPP: 650.0000 mg | Freq: Four times a day (QID) | RECTAL | Status: DC | PRN

## 2017-11-09 MED ORDER — ALBUTEROL SULFATE (2.5 MG/3ML) 0.083% IN NEBU: 3.0000 mL | INHALATION_SOLUTION | Freq: Every day | RESPIRATORY_TRACT | Status: DC | PRN

## 2017-11-09 NOTE — Progress Notes (Signed)
Neuro: Pt A&O X4. Pt with delayed speech at baseline. Neuro exam WNL. Will continue to monitor.    Respiratory:  Pt on 2L 02 via [place] with O2 sats WNL.  Cardiovascular: Pt remains in sinus rhythm/SB with no ectopy at this time. BP continues to be WNL. Pt afebrile at this time.   GI/GU: Pt with low urine output at this time, which is pts baseline. Pt reports only making small amounts of urine. Last BM today. Pt with good PO intake.    Skin: Skin intact with no s/s of skin breakdown at this time. Pt repositioning independently.   Pain: Pt with no reports of pain.    Events: NO acute events throughout shift. Pts plan of care to continue with current regimen, Family updated and no further questions at this time.

## 2017-11-09 NOTE — Progress Notes (Addendum)
Patient had hemodialysis.  She arrived to the ICU unit awake oriented in no distress tolerating nasal cannula 2 L/min and hemodynamically stable.  At this point the patient is stable to be transferred to the floor. Follow with serum Potassium level.

## 2017-11-09 NOTE — Care Management (Signed)
HD patient. I have notified Estill Bamberg liaison with [employer]. I am also checking with HD SW regarding medication concerns. Patient has Medicaid.

## 2017-11-09 NOTE — Progress Notes (Signed)
HD initiated via L AVG without issue. No heparin treatment. Goal set to 4L as ordered. 2k bath. Patient currently without complaints. Lungs diminished bilaterally, moderate generalized edema.

## 2017-11-09 NOTE — Clinical Social Work Note (Signed)
CSW received consult for inability to afford medications. Please consult RN CM to address this concern. Please re-consult CSW should CSW needs arise. Shela Leff MSW,LCSW [phone]

## 2017-11-09 NOTE — Progress Notes (Signed)
HD completed without issue. Total UF 3.6L. Patient tolerated well. Total UF 3.4L due to some cramping during last 30 minutes. Post vitals stable. All blood returned. Report called to primary RN

## 2017-11-09 NOTE — Consult Note (Signed)
Name: Jasmine Holloway MRN: [id number] DOB: 01-17-1961    ADMISSION DATE:  11/08/2017 CONSULTATION DATE: 11/09/2017  REFERRING MD : Dr. Duane Boston   CHIEF COMPLAINT: Shortness of Breath   BRIEF PATIENT DESCRIPTION:  57 yo female with ESRD on HD admitted with acute on chronic hypoxic respiratory failure secondary to acute CHF exacerbation inability to complete full HD session on 11/06/17  SIGNIFICANT EVENTS  04/2-Pt admitted to stepdown unit   STUDIES:  None   HISTORY OF PRESENT ILLNESS:   This is a 57 yo female with a PMH of ESRD on HD (T-Th-Sat), Neuropathy, HIV, Chronic Diastolic CHF, Diabetes Mellitus, COPD, and Asthma.  She presented to [facility] ER on 04/1 via EMS with worsening shortness of breath and edema of the bilateral lower extremities and abdomen onset of symptoms 2 days prior to presentation.  Upon EMS arrival at pts home she was hypoxic with O2 sats in the mid 80's, therefore she was placed on CPAP.  In the ER pt transitioned to Bipap.  According to the pt her most recent HD session was 11/06/17, however she only received half of the treatment due to machine clotting.  CXR revealed mild interstitial perihilar edema.  Lab results revealed K+ 5.9, creatinine 12.94, BUN 78, BNP 599, and hgb 9.8.  She was subsequently admitted to the stepdown unit by hospitalist team for further workup and treatment.   PAST MEDICAL HISTORY :   has a past medical history of Asthma, CHF (congestive heart failure) ([place]), COPD (chronic obstructive pulmonary disease) ([place]), Diabetes mellitus without complication ([place]), Diastolic heart failure ([place]), ESRD (end stage renal disease) on dialysis ([place]), HIV (human immunodeficiency virus infection) ([place]), Hypertension, Neuropathy, Polycystic kidney disease, and Renal insufficiency.  has a past surgical history that includes Arteriovenous graft placement (Right); Vein harvest; Colon surgery; Cardiac catheterization (Right, 08/04/2016); Breast biopsy (Left, 12/03/2015);  Esophagogastroduodenoscopy (egd) with propofol (N/A, 02/04/2017); and Abdominal surgery. Prior to Admission medications   Medication Sig Start Date End Date Taking? Authorizing Provider  acetaminophen (TYLENOL) 325 MG tablet Take 2 tablets (650 mg total) by mouth every 6 (six) hours as needed for mild pain or headache. 03/18/16  Yes Gouru, Aruna, MD  amitriptyline (ELAVIL) 10 MG tablet Take 10 mg by mouth at bedtime as needed for sleep.   Yes [provider]  amLODipine (NORVASC) 5 MG tablet Take 5 mg daily by mouth.  08/31/14  Yes [provider]  aspirin 81 MG chewable tablet Chew 1 tablet (81 mg total) by mouth daily. 01/17/16  Yes Gladstone Lighter, MD  b complex-vitamin c-folic acid (NEPHRO-VITE) 0.8 MG TABS tablet Take 1 tablet by mouth daily.    Yes [provider]  calcium acetate (PHOSLO) 667 MG capsule Take 1 capsule (667 mg total) by mouth 3 (three) times daily with meals. Patient taking differently: Take 1,334 mg by mouth 3 (three) times daily with meals.  02/02/17  Yes Wieting, Richard, MD  cloNIDine (CATAPRES) 0.1 MG tablet Take 1 tablet (0.1 mg total) by mouth 2 (two) times daily. 01/17/16  Yes Gladstone Lighter, MD  epoetin alfa (EPOGEN,PROCRIT) 41324 UNIT/ML injection 10,000 Units 3 (three) times a week.   Yes [provider]  furosemide (LASIX) 80 MG tablet Take 1 tablet (80 mg total) by mouth daily. 03/05/16 11/10/17 Yes Hackney, Otila Kluver A, FNP  gabapentin (NEURONTIN) 300 MG capsule Take 300 mg 2 (two) times daily by mouth.    Yes [provider]  hydrALAZINE (APRESOLINE) 100 MG tablet Take 1 tablet (100  mg total) by mouth 3 (three) times daily. 09/07/17  Yes Mody, Sital, MD  lidocaine (XYLOCAINE) 5 % ointment Apply 1 application topically daily as needed.    Yes [provider]  losartan (COZAAR) 100 MG tablet Take 1 tablet (100 mg total) by mouth daily. 09/07/17  Yes Mody, Ulice Bold, MD  metoprolol succinate (TOPROL-XL) 50 MG 24 hr tablet Take  50 mg daily by mouth.    Yes [provider]  nitroGLYCERIN (NITROSTAT) 0.4 MG SL tablet Place 1 tablet (0.4 mg total) under the tongue every 5 (five) minutes as needed for chest pain. 01/14/17 09/17/18 Yes Gollan, Kathlene November, MD  omeprazole (PRILOSEC) 20 MG capsule Take 1 capsule by mouth daily. 08/06/17  Yes [provider]  ondansetron (ZOFRAN) 4 MG tablet Take 1 tablet (4 mg total) by mouth every 8 (eight) hours as needed for nausea or vomiting. 07/21/16  Yes Daymon Larsen, MD  oxyCODONE-acetaminophen (PERCOCET) 10-325 MG tablet Take 1 tablet by mouth daily as needed for pain.   Yes [provider]  VENTOLIN HFA 108 (90 Base) MCG/ACT inhaler Inhale 2 puffs into the lungs daily as needed.  05/21/16  Yes [provider]  diphenhydrAMINE (BENADRYL) 25 mg capsule Take 2 capsules (50 mg total) by mouth every 6 (six) hours as needed. Patient not taking: Reported on 10/01/2017 10/16/16   Carrie Mew, MD  metoCLOPramide (REGLAN) 5 MG tablet Take 1 tablet (5 mg total) by mouth 2 (two) times daily as needed (nausea, vomiting or headache). Patient not taking: Reported on 10/01/2017 08/05/17 08/05/18  Earleen Newport, MD  prochlorperazine (COMPAZINE) 10 MG tablet Take 1 tablet (10 mg total) by mouth every 6 (six) hours as needed for nausea. Patient not taking: Reported on 10/01/2017 10/15/16   Paulette Blanch, MD  protein supplement shake (PREMIER PROTEIN) LIQD Take 2 oz by mouth 3 (three) times a week. Tuesday, Thursday, Saturday    [provider]  SENSIPAR 30 MG tablet Take 1 tablet (30 mg total) by mouth daily. Patient not taking: Reported on 10/16/2017 07/07/16   Fritzi Mandes, MD   Allergies  Allergen Reactions  . Morphine And Related Nausea And Vomiting  . Strawberry Extract   . Tramadol Nausea And Vomiting  . Venofer [Iron Sucrose]   . Buprenorphine Hcl Nausea And Vomiting  . Morphine Nausea And Vomiting    Halllucinations    FAMILY HISTORY:  family  history includes Heart disease in her mother; Heart failure in her brother; Hypertension in her brother and mother. SOCIAL HISTORY:  reports that she quit smoking about 2 years ago. She smoked 0.25 packs per day. She has never used smokeless tobacco. She reports that she does not drink alcohol or use drugs.  REVIEW OF SYSTEMS: Positives in BOLD  Constitutional: Negative for fever, chills, weight loss, malaise/fatigue and diaphoresis.  HENT: Negative for hearing loss, ear pain, nosebleeds, congestion, sore throat, neck pain, tinnitus and ear discharge.   Eyes: Negative for blurred vision, double vision, photophobia, pain, discharge and redness.  Respiratory: cough, hemoptysis, sputum production, shortness of breath, wheezing and stridor.   Cardiovascular: chest pain, palpitations, orthopnea, claudication, leg swelling and PND.  Gastrointestinal: Negative for heartburn, nausea, vomiting, abdominal pain, diarrhea, constipation, blood in stool and melena.  Genitourinary: Negative for dysuria, urgency, frequency, hematuria and flank pain.  Musculoskeletal: Negative for myalgias, back pain, joint pain and falls.  Skin: Negative for itching and rash.  Neurological: Negative for dizziness, tingling, tremors, sensory change, speech change,  focal weakness, seizures, loss of consciousness, weakness and headaches.  Endo/Heme/Allergies: Negative for environmental allergies and polydipsia. Does not bruise/bleed easily.  SUBJECTIVE:  Pt states breathing has improved significantly she is currently on nasal canula @ 2L  VITAL SIGNS: Temp:  [98.2 F (36.8 C)] 98.2 F (36.8 C) (04/01 2115) Pulse Rate:  [54-66] 55 (04/02 0000) Resp:  [14-28] 28 (04/02 0000) BP: (135-162)/(72-83) 140/74 (04/02 0000) SpO2:  [98 %-100 %] 100 % (04/02 0000) Weight:  [81.6 kg (180 lb)] 81.6 kg (180 lb) (04/01 2128)  PHYSICAL EXAMINATION: General: well developed, well nourished female, NAD  Neuro: alert and oriented, follows  commands  HEENT: supple, mild JVD Cardiovascular: nsr, rrr, no R/G Lungs: diffuse crackles throughout, even, non labored Abdomen: +BS x4, obese, soft, tender, non distended  Musculoskeletal: trace bilateral lower extremity edema Skin: intact no rashes or lesions, RUE fistula +bruit and thrill  Recent Labs  Lab 11/08/17 2113  NA 141  K 5.9*  CL 100*  CO2 24  BUN 78*  CREATININE 12.94*  GLUCOSE 103*   Recent Labs  Lab 11/08/17 2113  HGB 9.8*  HCT 31.0*  WBC 7.8  PLT 376   Dg Chest Portable 1 View  Result Date: 11/08/2017 CLINICAL DATA:  Shortness of breath EXAM: PORTABLE CHEST 1 VIEW COMPARISON:  10/16/2017 FINDINGS: Cardiomegaly with mild perihilar edema. No definite pleural effusions. No pneumothorax. Surgical clips in the left chest wall/axilla. IMPRESSION: Cardiomegaly with mild perihilar edema. Electronically Signed   By: Julian Hy M.D.   On: 11/08/2017 21:51    ASSESSMENT / PLAN: Acute on chronic hypoxic respiratory failure secondary to acute CHF exacerbation  ESRD on Hemodialysis (T-Th-Sat) Hyperkalemia  Anemia without obvious acute blood loss  Hx: HIV, Asthma, HTN, and COPD  P: Prn Bipap for dyspnea and/or hypoxia  Prn bronchodilator therapy  Continuous telemetry monitoring  Continue outpatient cardiac medications  Nephrology consulted appreciate input-HD per recommendations  Trend BMP Replace electrolytes as indicated  Monitor UOP  VTE px: subq heparin Trend CBC Monitor for s/sx of bleeding  Transfuse for hgb <7  Marda Stalker, [place] Pager [phone] (please enter 7 digits) PCCM Consult Pager [phone] (please enter 7 digits)

## 2017-11-09 NOTE — Progress Notes (Signed)
[facility]  ROUNDING NOTE   Subjective:   [name] admitted to [facility] on 11/08/2017 for Hyperkalemia [E87.5] Pulmonary edema, acute ([place]) [J81.0] Cardiac volume overload [E87.79]  Hemodialysis this morning. Tolerating treatment well. UF of goal 3468mL.     HEMODIALYSIS FLOWSHEET:  Blood Flow Rate (mL/min): 400 mL/min Arterial Pressure (mmHg): -220 mmHg Venous Pressure (mmHg): 290 mmHg Transmembrane Pressure (mmHg): 80 mmHg Ultrafiltration Rate (mL/min): 0 mL/min Dialysate Flow Rate (mL/min): 800 ml/min Conductivity: Machine : 13.9 Conductivity: Machine : 13.9 Dialysis Fluid Bolus: Normal Saline Bolus Amount (mL): 100 mL Dialysate Change: 2K    Objective:  Vital signs in last 24 hours:  Temp:  [97.2 F (36.2 C)-98.2 F (36.8 C)] 97.9 F (36.6 C) (04/02 1253) Pulse Rate:  [52-67] 56 (04/02 1253) Resp:  [12-28] 14 (04/02 1253) BP: (135-201)/(64-103) 196/94 (04/02 1253) SpO2:  [94 %-100 %] 100 % (04/02 1253) Weight:  [80.2 kg (176 lb 12.9 oz)-84.3 kg (185 lb 13.6 oz)] 80.2 kg (176 lb 12.9 oz) (04/02 1253)  Weight change:  Filed Weights   11/09/17 0125 11/09/17 0907 11/09/17 1253  Weight: 84.3 kg (185 lb 13.6 oz) 84.3 kg (185 lb 13.6 oz) 80.2 kg (176 lb 12.9 oz)    Intake/Output: No intake/output data recorded.   Intake/Output this shift:  Total I/O In: 150 [P.O.:150] Out: 3450 [Other:3450]  Physical Exam: General: NAD, laying in bed  Head: Normocephalic, atraumatic. Moist oral mucosal membranes  Eyes: Anicteric, PERRL  Neck: Supple, trachea midline  Lungs:  Clear to auscultation  Heart: Regular rate and rhythm  Abdomen:  Soft, nontender,   Extremities: no peripheral edema.  Neurologic: Nonfocal, moving all four extremities  Skin: No lesions  Access: AVF    Basic Metabolic Panel: Recent Labs  Lab 11/08/17 11-05-2111 11/09/17 0626  NA 141 144  K 5.9* 5.9*  CL 100* 102  CO2 24 25  GLUCOSE 103* 104*  BUN 78* 85*  CREATININE 12.94*  14.43*  CALCIUM 8.1* 8.2*    Liver Function Tests: No results for input(s): AST, ALT, ALKPHOS, BILITOT, PROT, ALBUMIN in the last 168 hours. No results for input(s): LIPASE, AMYLASE in the last 168 hours. No results for input(s): AMMONIA in the last 168 hours.  CBC: Recent Labs  Lab 11/08/17 Nov 05, 2111 11/09/17 0626  WBC 7.8 6.3  HGB 9.8* 9.2*  HCT 31.0* 28.9*  MCV 91.0 91.4  PLT 376 330    Cardiac Enzymes: Recent Labs  Lab 11/08/17 2113  TROPONINI <0.03    BNP: Invalid input(s): POCBNP  CBG: Recent Labs  Lab 11/09/17 0116 11/09/17 0804  GLUCAP 91 59    Microbiology: Results for orders placed or performed during the hospital encounter of 11/08/17  MRSA PCR Screening     Status: None   Collection Time: 11/09/17  1:40 AM  Result Value Ref Range Status   MRSA by PCR NEGATIVE NEGATIVE Final    Comment:        The GeneXpert MRSA Assay (FDA approved for NASAL specimens only), is one component of a comprehensive MRSA colonization surveillance program. It is not intended to diagnose MRSA infection nor to guide or monitor treatment for MRSA infections. Performed at [facility], [address] [place], [place] [address]     Coagulation Studies: Recent Labs    11/08/17 2111-11-05  LABPROT 13.7  INR 1.06    Urinalysis: No results for input(s): COLORURINE, LABSPEC, PHURINE, GLUCOSEU, HGBUR, BILIRUBINUR, KETONESUR, PROTEINUR, UROBILINOGEN, NITRITE, LEUKOCYTESUR in the last 72 hours.  Invalid input(s): APPERANCEUR    Imaging: Dg Chest Portable 1 View  Result Date: 11/08/2017 CLINICAL DATA:  Shortness of breath EXAM: PORTABLE CHEST 1 VIEW COMPARISON:  10/16/2017 FINDINGS: Cardiomegaly with mild perihilar edema. No definite pleural effusions. No pneumothorax. Surgical clips in the left chest wall/axilla. IMPRESSION: Cardiomegaly with mild perihilar edema. Electronically Signed   By: Julian Hy M.D.   On: 11/08/2017 21:51     Medications:    .  amLODipine  5 mg Oral Daily  . aspirin  81 mg Oral Daily  . calcium acetate  1,334 mg Oral TID WC  . cloNIDine  0.1 mg Oral BID  . docusate sodium  100 mg Oral BID  . furosemide  80 mg Oral Daily  . gabapentin  300 mg Oral BID  . heparin  5,000 Units Subcutaneous Q8H  . hydrALAZINE  100 mg Oral TID  . losartan  100 mg Oral Daily  . mouth rinse  15 mL Mouth Rinse BID  . metoprolol succinate  50 mg Oral Daily  . multivitamin  1 tablet Oral QHS  . pantoprazole  40 mg Oral Daily  . [START ON 11/10/2017] protein supplement shake  2 oz Oral Once per day on Mon Wed Fri   acetaminophen **OR** acetaminophen, albuterol, amitriptyline, bisacodyl, nitroGLYCERIN, ondansetron **OR** ondansetron (ZOFRAN) IV, traZODone  Assessment/ Plan:  [name] is a 57 y.o. black female with end-stage renal disease secondary to polycystic kidney disease on hemodialysis, hypertension, GERD, diastolic congestive heart failure, peripheral neuropathy, COPD/asthma  TTS [place]. Right AVG.  1.  ESRD on HD TTS.  with hyperkalemia Seen and examined on hemodialysis.   2.  Acute respiratory failure. Placed on BiPAP overnight Currently breathing room air  3.  Anemia chronic kidney disease.  Hemoglobin 9.2  - EPO as outpatient.   4.  Secondary hyperparathyroidism - calcium acetate with meals.   5. Hypertension: well controlled during hemodialysis treatment.    LOS: 1 Jasmine Holloway 4/2/20191:44 PM

## 2017-11-09 NOTE — Progress Notes (Signed)
Pre hd 

## 2017-11-09 NOTE — Progress Notes (Signed)
Patient ID: Jasmine Holloway, female   DOB: 1961/04/17, 57 y.o.   MRN: [id number]  [employer] PROGRESS NOTE  Reginae Wolfrey CHE:[id number] DOB: May 15, 1961 DOA: 11/08/2017 PCP: Theotis Burrow, MD  HPI/Subjective: Patient feeling better.  Offers no complaints.  Breathing much better.  Patient states that she had a partial hemodialysis as outpatient the last 2 sessions.  Patient has had problem with fluid overload.  Objective: Vitals:   11/09/17 1400 11/09/17 1500  BP: (!) 174/88 (!) 160/83  Pulse: (!) 57 61  Resp: 20 17  Temp:    SpO2: 100% 100%    Filed Weights   11/09/17 0125 11/09/17 0907 11/09/17 1253  Weight: 84.3 kg (185 lb 13.6 oz) 84.3 kg (185 lb 13.6 oz) 80.2 kg (176 lb 12.9 oz)    ROS: Review of Systems  Constitutional: Negative for chills and fever.  Eyes: Negative for blurred vision.  Respiratory: Positive for cough and shortness of breath.   Cardiovascular: Negative for chest pain.  Gastrointestinal: Negative for abdominal pain, constipation, diarrhea, nausea and vomiting.  Genitourinary: Negative for dysuria.  Musculoskeletal: Negative for joint pain.  Neurological: Negative for dizziness and headaches.   Exam: Physical Exam  Constitutional: She is oriented to person, place, and time.  HENT:  Nose: No mucosal edema.  Mouth/Throat: No oropharyngeal exudate or posterior oropharyngeal edema.  Eyes: Pupils are equal, round, and reactive to light. Conjunctivae, EOM and lids are normal.  Neck: No JVD present. Carotid bruit is not present. No edema present. No thyroid mass and no thyromegaly present.  Cardiovascular: S1 normal and S2 normal. Exam reveals no gallop.  No murmur heard. Pulses:      Dorsalis pedis pulses are 2+ on the right side, and 2+ on the left side.  Respiratory: No respiratory distress. She has decreased breath sounds in the right middle field, the right lower field, the left middle field and the left lower field. She has no wheezes. She has no  rhonchi. She has rales in the right lower field and the left lower field.  GI: Soft. Bowel sounds are normal. There is no tenderness.  Musculoskeletal:       Right ankle: She exhibits no swelling.       Left ankle: She exhibits no swelling.  Lymphadenopathy:    She has no cervical adenopathy.  Neurological: She is alert and oriented to person, place, and time. No cranial nerve deficit.  Skin: Skin is warm. No rash noted. Nails show no clubbing.  Psychiatric: She has a normal mood and affect.      Data Reviewed: Basic Metabolic Panel: Recent Labs  Lab 11/08/17 2113 11/09/17 0626  NA 141 144  K 5.9* 5.9*  CL 100* 102  CO2 24 25  GLUCOSE 103* 104*  BUN 78* 85*  CREATININE 12.94* 14.43*  CALCIUM 8.1* 8.2*   CBC: Recent Labs  Lab 11/08/17 2113 11/09/17 0626  WBC 7.8 6.3  HGB 9.8* 9.2*  HCT 31.0* 28.9*  MCV 91.0 91.4  PLT 376 330   Cardiac Enzymes: Recent Labs  Lab 11/08/17 2113  TROPONINI <0.03   BNP (last 3 results) Recent Labs    09/06/17 1638 09/21/17 0708 11/08/17 2113  BNP 1,148.0* 1,122.0* 599.0*      CBG: Recent Labs  Lab 11/09/17 0116 11/09/17 0804  GLUCAP 91 82    Recent Results (from the past 240 hour(s))  MRSA PCR Screening     Status: None   Collection Time: 11/09/17  1:40 AM  Result  Value Ref Range Status   MRSA by PCR NEGATIVE NEGATIVE Final    Comment:        The GeneXpert MRSA Assay (FDA approved for NASAL specimens only), is one component of a comprehensive MRSA colonization surveillance program. It is not intended to diagnose MRSA infection nor to guide or monitor treatment for MRSA infections. Performed at [facility], [address] [place], [place] [address]      Studies: Dg Chest Portable 1 View  Result Date: 11/08/2017 CLINICAL DATA:  Shortness of breath EXAM: PORTABLE CHEST 1 VIEW COMPARISON:  10/16/2017 FINDINGS: Cardiomegaly with mild perihilar edema. No definite pleural effusions. No pneumothorax.  Surgical clips in the left chest wall/axilla. IMPRESSION: Cardiomegaly with mild perihilar edema. Electronically Signed   By: Julian Hy M.D.   On: 11/08/2017 21:51    Scheduled Meds: . amLODipine  5 mg Oral Daily  . aspirin  81 mg Oral Daily  . calcium acetate  1,334 mg Oral TID WC  . cloNIDine  0.1 mg Oral BID  . docusate sodium  100 mg Oral BID  . furosemide  80 mg Oral Daily  . gabapentin  300 mg Oral BID  . heparin  5,000 Units Subcutaneous Q8H  . hydrALAZINE  100 mg Oral TID  . losartan  100 mg Oral Daily  . mouth rinse  15 mL Mouth Rinse BID  . metoprolol succinate  50 mg Oral Daily  . multivitamin  1 tablet Oral QHS  . pantoprazole  40 mg Oral Daily  . [START ON 11/10/2017] protein supplement shake  2 oz Oral Once per day on Mon Wed Fri   Assessment/Plan:  1. Acute on chronic hypoxic respiratory failure.  Patient initially required BiPAP and now on nasal cannula.  The patient chronically wears nasal cannula at home. 2. Fluid overload and chronic diastolic congestive heart failure.  Dialysis to remove fluid.  Patient does take Lasix daily.  Patient on metoprolol and losartan. 3. End-stage renal disease.  Patient status post hemodialysis today. 4. Essential hypertension continue usual medications 5. Neuropathy on gabapentin 6. GERD on Protonix  Code Status:     Code Status Orders  (From admission, onward)        Start     Ordered   11/09/17 0122  Full code  Continuous     11/09/17 0122    Code Status History    Date Active Date Inactive Code Status Order ID Comments User Context   09/21/2017 0822 09/22/2017 1855 Full Code [id number]  Hillary Bow, MD ED   09/07/2017 0230 09/07/2017 1908 Full Code [id number]  Lance Coon, MD Inpatient   06/15/2017 1946 06/17/2017 1843 Full Code [id number]  Dustin Flock, MD Inpatient   02/01/2017 0747 02/02/2017 1740 Full Code [id number]  Loletha Grayer, MD ED   11/10/2016 0821 11/11/2016 1754 Full Code [id number]  Hugelmeyer, Alexis,  DO Inpatient   07/04/2016 0509 07/07/2016 2031 Full Code [id number]  Mikael Spray, NP ED   06/09/2016 0458 06/10/2016 1932 Full Code [id number]  Awilda Bill, NP ED   05/11/2016 1024 05/12/2016 1826 Full Code [id number]  Vaughan Basta, MD Inpatient   04/28/2016 2030 04/30/2016 1749 Full Code [id number]  Gladstone Lighter, MD Inpatient   03/28/2016 1825 03/29/2016 1726 Full Code [id number]  Vaughan Basta, MD ED   03/17/2016 0606 03/18/2016 0751 Full Code [id number]  Saundra Shelling, MD ED   01/10/2016 2111 01/17/2016 2136 Full Code [id number]  Mikael Spray, NP ED   12/21/2015 [phone]  12/22/2015 1609 Full Code [id number]  Saundra Shelling, MD Inpatient   02/24/2015 0352 02/24/2015 1615 Full Code [id number]  Harrie Foreman, MD Inpatient     Family Communication: As per critical care specialist Disposition Plan: We will need to breathe a little bit better prior to disposition  Consultants:  Nephrology  Time spent: 26 minutes  [place]

## 2017-11-10 LAB — GLUCOSE, CAPILLARY: Glucose-Capillary: 96 mg/dL (ref 65–99)

## 2017-11-10 MED ORDER — CALCIUM ACETATE (PHOS BINDER) 667 MG PO CAPS: 1334.0000 mg | ORAL_CAPSULE | Freq: Three times a day (TID) | ORAL | Status: DC

## 2017-11-10 MED ORDER — EPOETIN ALFA 10000 UNIT/ML IJ SOLN: 10000.0000 [IU] | INTRAMUSCULAR | Status: DC

## 2017-11-10 NOTE — Discharge Summary (Signed)
[facility] [place] NAME: Jasmine Holloway    MR#:  [id number]  DATE OF BIRTH:  Oct 27, 1960  DATE OF ADMISSION:  11/08/2017 ADMITTING PHYSICIAN: Amelia Jo, MD  DATE OF DISCHARGE: 11/10/2017  1:00 PM  PRIMARY CARE PHYSICIAN: Theotis Burrow, MD    ADMISSION DIAGNOSIS:  Hyperkalemia [E87.5] Pulmonary edema, acute ([place]) [J81.0] Cardiac volume overload [E87.79]  DISCHARGE DIAGNOSIS:  Acute on chronic respiratory failure Acute fluid overload and diastolic congestive heart failure  SECONDARY DIAGNOSIS:   Hypertension Neuropathy End-stage renal disease Type 2 diabetes COPD  HOSPITAL COURSE:   1.  Acute on chronic hypoxic respiratory failure.  The patient initially required BiPAP on presentation.  The patient is now breathing comfortably on her baseline nasal cannula. 2.  Fluid overload and chronic diastolic congestive heart failure.  She had one dialysis session that remove fluid.  The patient does  take Lasix on a daily basis.  Continue metoprolol and losartan.  Case discussed with nephrology to have the patient on 15 minutes of extra hemodialysis as outpatient to maintain fluid balance. 3.  End-stage renal disease on hemodialysis.  Patient states that her last 2 sessions outside the hospital have been shortened for some reason.  Case discussed with Dr. Rolly Salter nephrology and he will make sure that she gets extra 15 minutes of dialysis as outpatient. 4.  Essential hypertension on her usual medications 5.  Neuropathy on gabapentin 6.  GERD on PPI 7.  Anemia of chronic disease   DISCHARGE CONDITIONS:   Satisfactory  CONSULTS OBTAINED:  Treatment Team:  Lavonia Dana, MD  DRUG ALLERGIES:   Allergies  Allergen Reactions  . Morphine And Related Nausea And Vomiting  . Strawberry Extract   . Tramadol Nausea And Vomiting  . Venofer [Iron Sucrose]   . Buprenorphine Hcl Nausea And Vomiting  . Morphine Nausea And Vomiting     Halllucinations    DISCHARGE MEDICATIONS:   Allergies as of 11/10/2017      Reactions   Morphine And Related Nausea And Vomiting   Strawberry Extract    Tramadol Nausea And Vomiting   Venofer [iron Sucrose]    Buprenorphine Hcl Nausea And Vomiting   Morphine Nausea And Vomiting   Halllucinations      Medication List    STOP taking these medications   diphenhydrAMINE 25 mg capsule Commonly known as:  BENADRYL   metoCLOPramide 5 MG tablet Commonly known as:  REGLAN   prochlorperazine 10 MG tablet Commonly known as:  COMPAZINE     TAKE these medications   acetaminophen 325 MG tablet Commonly known as:  TYLENOL Take 2 tablets (650 mg total) by mouth every 6 (six) hours as needed for mild pain or headache.   amitriptyline 10 MG tablet Commonly known as:  ELAVIL Take 10 mg by mouth at bedtime as needed for sleep.   amLODipine 5 MG tablet Commonly known as:  NORVASC Take 5 mg daily by mouth.   aspirin 81 MG chewable tablet Chew 1 tablet (81 mg total) by mouth daily.   b complex-vitamin c-folic acid 0.8 MG Tabs tablet Take 1 tablet by mouth daily.   calcium acetate 667 MG capsule Commonly known as:  PHOSLO Take 2 capsules (1,334 mg total) by mouth 3 (three) times daily with meals.   cloNIDine 0.1 MG tablet Commonly known as:  CATAPRES Take 1 tablet (0.1 mg total) by mouth 2 (two) times daily.   epoetin alfa 10000 UNIT/ML injection Commonly known  as:  EPOGEN,PROCRIT 10,000 Units 3 (three) times a week.   furosemide 80 MG tablet Commonly known as:  LASIX Take 1 tablet (80 mg total) by mouth daily.   gabapentin 300 MG capsule Commonly known as:  NEURONTIN Take 300 mg 2 (two) times daily by mouth.   hydrALAZINE 100 MG tablet Commonly known as:  APRESOLINE Take 1 tablet (100 mg total) by mouth 3 (three) times daily.   lidocaine 5 % ointment Commonly known as:  XYLOCAINE Apply 1 application topically daily as needed.   losartan 100 MG tablet Commonly known  as:  COZAAR Take 1 tablet (100 mg total) by mouth daily.   metoprolol succinate 50 MG 24 hr tablet Commonly known as:  TOPROL-XL Take 50 mg daily by mouth.   nitroGLYCERIN 0.4 MG SL tablet Commonly known as:  NITROSTAT Place 1 tablet (0.4 mg total) under the tongue every 5 (five) minutes as needed for chest pain.   omeprazole 20 MG capsule Commonly known as:  PRILOSEC Take 1 capsule by mouth daily.   ondansetron 4 MG tablet Commonly known as:  ZOFRAN Take 1 tablet (4 mg total) by mouth every 8 (eight) hours as needed for nausea or vomiting.   oxyCODONE-acetaminophen 10-325 MG tablet Commonly known as:  PERCOCET Take 1 tablet by mouth daily as needed for pain.   protein supplement shake Liqd Commonly known as:  PREMIER PROTEIN Take 2 oz by mouth 3 (three) times a week. Tuesday, Thursday, Saturday   SENSIPAR 30 MG tablet Generic drug:  cinacalcet Take 1 tablet (30 mg total) by mouth daily.   VENTOLIN HFA 108 (90 Base) MCG/ACT inhaler Generic drug:  albuterol Inhale 2 puffs into the lungs daily as needed.        DISCHARGE INSTRUCTIONS:   Follow-up PMD 6 days Follow-up with dialysis as scheduled  If you experience worsening of your admission symptoms, develop shortness of breath, life threatening emergency, suicidal or homicidal thoughts you must seek medical attention immediately by calling 911 or calling your MD immediately  if symptoms less severe.  You Must read complete instructions/literature along with all the possible adverse reactions/side effects for all the Medicines you take and that have been prescribed to you. Take any new Medicines after you have completely understood and accept all the possible adverse reactions/side effects.   Please note  You were cared for by a hospitalist during your hospital stay. If you have any questions about your discharge medications or the care you received while you were in the hospital after you are discharged, you can call  the unit and asked to speak with the hospitalist on call if the hospitalist that took care of you is not available. Once you are discharged, your primary care physician will handle any further medical issues. Please note that NO REFILLS for any discharge medications will be authorized once you are discharged, as it is imperative that you return to your primary care physician (or establish a relationship with a primary care physician if you do not have one) for your aftercare needs so that they can reassess your need for medications and monitor your lab values.    Today   CHIEF COMPLAINT:   Chief Complaint  Patient presents with  . Respiratory Distress    HISTORY OF PRESENT ILLNESS:  Grasiela Jonsson  is a 57 y.o. female  presented with respiratory distress    VITAL SIGNS:  Blood pressure (!) 156/88, pulse (!) 58, temperature 97.7 F (36.5 C), temperature source  Oral, resp. rate 18, height 5\' 3"  (1.6 m), weight 78.5 kg (173 lb 1 oz), SpO2 100 %.   PHYSICAL EXAMINATION:  GENERAL:  57 y.o.-year-old patient lying in the bed with no acute distress.  EYES: Pupils equal, round, reactive to light and accommodation. No scleral icterus. Extraocular muscles intact.  HEENT: Head atraumatic, normocephalic. Oropharynx and nasopharynx clear.  NECK:  Supple, no jugular venous distention. No thyroid enlargement, no tenderness.  LUNGS: Normal breath sounds bilaterally, no wheezing, rales,rhonchi or crepitation. No use of accessory muscles of respiration.  CARDIOVASCULAR: S1, S2 normal. No murmurs, rubs, or gallops.  ABDOMEN: Soft, non-tender, non-distended. Bowel sounds present. No organomegaly or mass.  EXTREMITIES: No pedal edema, cyanosis, or clubbing.  NEUROLOGIC: Cranial nerves II through XII are intact. Muscle strength 5/5 in all extremities. Sensation intact. Gait not checked.  PSYCHIATRIC: The patient is alert and oriented x 3.  SKIN: No obvious rash, lesion, or ulcer.   DATA REVIEW:    CBC Recent Labs  Lab 11/09/17 0626  WBC 6.3  HGB 9.2*  HCT 28.9*  PLT 330    Chemistries  Recent Labs  Lab 11/09/17 0626 11/09/17 1658  NA 144  --   K 5.9* 4.2  CL 102  --   CO2 25  --   GLUCOSE 104*  --   BUN 85*  --   CREATININE 14.43*  --   CALCIUM 8.2*  --     Cardiac Enzymes Recent Labs  Lab 11/08/17 2113  TROPONINI <0.03    Microbiology Results  Results for orders placed or performed during the hospital encounter of 11/08/17  MRSA PCR Screening     Status: None   Collection Time: 11/09/17  1:40 AM  Result Value Ref Range Status   MRSA by PCR NEGATIVE NEGATIVE Final    Comment:        The GeneXpert MRSA Assay (FDA approved for NASAL specimens only), is one component of a comprehensive MRSA colonization surveillance program. It is not intended to diagnose MRSA infection nor to guide or monitor treatment for MRSA infections. Performed at [facility], [address] [place], [place] [address]     RADIOLOGY:  Dg Chest Portable 1 View  Result Date: 11/08/2017 CLINICAL DATA:  Shortness of breath EXAM: PORTABLE CHEST 1 VIEW COMPARISON:  10/16/2017 FINDINGS: Cardiomegaly with mild perihilar edema. No definite pleural effusions. No pneumothorax. Surgical clips in the left chest wall/axilla. IMPRESSION: Cardiomegaly with mild perihilar edema. Electronically Signed   By: Julian Hy M.D.   On: 11/08/2017 21:51      Management plans discussed with the patient, and she is in agreement.  CODE STATUS:  Code Status History    Date Active Date Inactive Code Status Order ID Comments User Context   11/09/2017 0122 11/10/2017 1600 Full Code [id number]  Amelia Jo, MD Inpatient   09/21/2017 0822 09/22/2017 1855 Full Code [id number]  Hillary Bow, MD ED   09/07/2017 0230 09/07/2017 1908 Full Code [id number]  Lance Coon, MD Inpatient   06/15/2017 1946 06/17/2017 1843 Full Code [id number]  Dustin Flock, MD Inpatient   02/01/2017 0747 02/02/2017 1740  Full Code [id number]  Loletha Grayer, MD ED   11/10/2016 0821 11/11/2016 1754 Full Code [id number]  [place], [place], DO Inpatient   07/04/2016 0509 07/07/2016 2031 Full Code [id number]  Mikael Spray, NP ED   06/09/2016 0458 06/10/2016 1932 Full Code [id number]  Awilda Bill, NP ED   05/11/2016 1024 05/12/2016 1826 Full Code [id number]  Vaughan Basta, MD Inpatient   04/28/2016 2030 04/30/2016 1749 Full Code [id number]  Gladstone Lighter, MD Inpatient   03/28/2016 1825 03/29/2016 1726 Full Code [id number]  Vaughan Basta, MD ED   03/17/2016 0606 03/18/2016 0751 Full Code [id number]  Saundra Shelling, MD ED   01/10/2016 2111 01/17/2016 2136 Full Code [id number]  Mikael Spray, NP ED   12/21/2015 0608 12/22/2015 1609 Full Code [id number]  Saundra Shelling, MD Inpatient   02/24/2015 0352 02/24/2015 1615 Full Code [id number]  Harrie Foreman, MD Inpatient      TOTAL TIME TAKING CARE OF THIS PATIENT: 35 minutes.    Loletha Grayer M.D on 11/10/2017 at 4:41 PM  Between 7am to 6pm - Pager - [phone]  After 6pm go to [email or web address] - password [email or web address]  [facility] Office  [phone]  CC: Primary care physician; Theotis Burrow, MD

## 2017-11-10 NOTE — Progress Notes (Signed)
[facility]  ROUNDING NOTE   Subjective:   Hemodialysis yesterday. Tolerated treatment well. UF of 3.4 liters  Currently on 2L [place] O2 with no difficulty breathing.   Objective:  Vital signs in last 24 hours:  Temp:  [97.7 F (36.5 C)-98.1 F (36.7 C)] 97.7 F (36.5 C) (04/03 0441) Pulse Rate:  [55-63] 58 (04/03 1041) Resp:  [13-25] 18 (04/03 0441) BP: (138-168)/(81-88) 156/88 (04/03 1041) SpO2:  [99 %-100 %] 100 % (04/03 1041) Weight:  [78.5 kg (173 lb 1 oz)] 78.5 kg (173 lb 1 oz) (04/03 0500)  Weight change: 2.652 kg (5 lb 13.6 oz) Filed Weights   11/09/17 0907 11/09/17 1253 11/10/17 0500  Weight: 84.3 kg (185 lb 13.6 oz) 80.2 kg (176 lb 12.9 oz) 78.5 kg (173 lb 1 oz)    Intake/Output: I/O last 3 completed shifts: In: 150 [P.O.:150] Out: 3450 [Other:3450]   Intake/Output this shift:  No intake/output data recorded.  Physical Exam: General: NAD, laying in bed  Head: Normocephalic, atraumatic. Moist oral mucosal membranes  Eyes: Anicteric, PERRL  Neck: Supple, trachea midline  Lungs:  Clear to auscultation  Heart: Regular rate and rhythm  Abdomen:  Soft, nontender,   Extremities: no peripheral edema.  Neurologic: Nonfocal, moving all four extremities  Skin: No lesions  Access: Right AVG    Basic Metabolic Panel: Recent Labs  Lab 11/08/17 06-Nov-2111 11/09/17 0626 11/09/17 1658  NA 141 144  --   K 5.9* 5.9* 4.2  CL 100* 102  --   CO2 24 25  --   GLUCOSE 103* 104*  --   BUN 78* 85*  --   CREATININE 12.94* 14.43*  --   CALCIUM 8.1* 8.2*  --     Liver Function Tests: No results for input(s): AST, ALT, ALKPHOS, BILITOT, PROT, ALBUMIN in the last 168 hours. No results for input(s): LIPASE, AMYLASE in the last 168 hours. No results for input(s): AMMONIA in the last 168 hours.  CBC: Recent Labs  Lab 11/08/17 11/06/2111 11/09/17 0626  WBC 7.8 6.3  HGB 9.8* 9.2*  HCT 31.0* 28.9*  MCV 91.0 91.4  PLT 376 330    Cardiac Enzymes: Recent Labs  Lab  11/08/17 2113  TROPONINI <0.03    BNP: Invalid input(s): POCBNP  CBG: Recent Labs  Lab 11/09/17 0116 11/09/17 0804 11/10/17 0735  GLUCAP 91 62 96    Microbiology: Results for orders placed or performed during the hospital encounter of 11/08/17  MRSA PCR Screening     Status: None   Collection Time: 11/09/17  1:40 AM  Result Value Ref Range Status   MRSA by PCR NEGATIVE NEGATIVE Final    Comment:        The GeneXpert MRSA Assay (FDA approved for NASAL specimens only), is one component of a comprehensive MRSA colonization surveillance program. It is not intended to diagnose MRSA infection nor to guide or monitor treatment for MRSA infections. Performed at [facility], [address] [place], [place] [address]     Coagulation Studies: Recent Labs    11/08/17 November 06, 2111  LABPROT 13.7  INR 1.06    Urinalysis: No results for input(s): COLORURINE, LABSPEC, PHURINE, GLUCOSEU, HGBUR, BILIRUBINUR, KETONESUR, PROTEINUR, UROBILINOGEN, NITRITE, LEUKOCYTESUR in the last 72 hours.  Invalid input(s): APPERANCEUR    Imaging: Dg Chest Portable 1 View  Result Date: 11/08/2017 CLINICAL DATA:  Shortness of breath EXAM: PORTABLE CHEST 1 VIEW COMPARISON:  10/16/2017 FINDINGS: Cardiomegaly with mild perihilar edema. No definite pleural effusions. No  pneumothorax. Surgical clips in the left chest wall/axilla. IMPRESSION: Cardiomegaly with mild perihilar edema. Electronically Signed   By: Jasmine Holloway M.D.   On: 11/08/2017 21:51     Medications:    . amLODipine  5 mg Oral Daily  . aspirin  81 mg Oral Daily  . calcium acetate  1,334 mg Oral TID WC  . cloNIDine  0.1 mg Oral BID  . docusate sodium  100 mg Oral BID  . [START ON 11/11/2017] epoetin (EPOGEN/PROCRIT) injection  10,000 Units Intravenous Q T,Th,Sa-HD  . furosemide  80 mg Oral Daily  . gabapentin  300 mg Oral BID  . heparin  5,000 Units Subcutaneous Q8H  . hydrALAZINE  100 mg Oral TID  . losartan  100 mg  Oral Daily  . mouth rinse  15 mL Mouth Rinse BID  . metoprolol succinate  50 mg Oral Daily  . multivitamin  1 tablet Oral QHS  . pantoprazole  40 mg Oral Daily  . protein supplement shake  2 oz Oral Once per day on Mon Wed Fri   acetaminophen **OR** acetaminophen, albuterol, amitriptyline, bisacodyl, nitroGLYCERIN, ondansetron **OR** ondansetron (ZOFRAN) IV, traZODone  Assessment/ Plan:  [name] is a 57 y.o. black female with end-stage renal disease secondary to polycystic kidney disease on hemodialysis, hypertension, GERD, diastolic congestive heart failure, peripheral neuropathy, COPD/asthma  TTS [place]. Right AVG EDW 79kg  1.  ESRD on HD TTS.  with hyperkalemia: - 1 K bath - Increase treatment time to 210 minutes  2.  Acute respiratory failure. Secondary to fluid overload and pulmonary edema - Discussed fluid restriction and prolonging hemodialysis treatments.   3.  Anemia chronic kidney disease.    - EPO as outpatient.   4.  Secondary hyperparathyroidism - calcium acetate with meals.   5. Hypertension: well controlled    LOS: 2 Jasmine Holloway 4/3/20192:44 PM

## 2017-11-10 NOTE — Progress Notes (Signed)
Discharge instructions reviewed with the patient.  Patient sent out with her own oxygen to her waiting car

## 2017-11-15 ENCOUNTER — Encounter: Admit: 2017-11-15 | Discharge: 2017-11-16 | Payer: 59

## 2017-11-15 DIAGNOSIS — M792 Neuralgia and neuritis, unspecified: Secondary | ICD-10-CM

## 2017-11-15 DIAGNOSIS — G894 Chronic pain syndrome: Principal | ICD-10-CM

## 2017-11-15 MED ORDER — OXYCODONE-ACETAMINOPHEN 10 MG-325 MG TABLET
ORAL_TABLET | Freq: Every day | ORAL | 0 refills | 0 days | Status: SS | PRN
Start: 2017-11-15 — End: 2018-01-11

## 2017-11-15 NOTE — Progress Notes (Signed)
Subjective:    Patient ID: Jasmine Holloway, female    DOB: Feb 03, 1961, 57 y.o.   MRN: [id number]  HPI   Jasmine Holloway is a 57 y/o female with a history of asthma, DM, HTN, kidney disease (dialysis), COPD, previous tobacco use and chronic heart failure.   Echo report from 09/21/17 reviewed and showed an EF of 60-65%. Reviewed echo report on 11/10/16 which showed an EF of 50-55% along with mild AR/MR. EF was 55-60% May 2017. Cardiac catheterization was done 03/17/16 and showed normal coronary arteries.   Admitted 11/08/17 due to acute on chronic hypoxic respiratory failure due to fluid overload. Initially needed bipap and then transitioned to her baseline nasal cannula. Case discussed with nephrology who recommended extended dialysis time. Discharged after 2 days. Was in the ED 10/16/17 due to chest wall pain where she was evaluated and released. Admitted 09/21/17 due to acute on chronic heart failure due to fluid intake noncompliance. Extra dialysis sessions done and she was counseled regarding fluid intake. Discharged the next day.  Admitted 09/06/17 due to HF exacerbation due to accelerated HTN. Cardiology consult was obtained. Elevated troponin thought to be due to poor renal clearance. Dialysis was done and patient was discharged the next day. Was in the ED 08/05/17 due to headache where she was treated and released. Was in the ED 07/21/17 with edema where she was treated and released. Was in the ED 07/05/17 with COPD exacerbation. Was given bipap, nebulizers and steroids. Was released with oral antibiotics. Admitted 06/15/17 due to HF exacerbation. Chest CT confirmed HF. Initially needed IV furosemide and then transitioned back to her oral diuretics. Also needed IV steroids and nebulizers initially. Nephrology was consulted and she was discharged home in 2 days.   She presents today for a follow-up visit with a chief complaint of mild shortness of breath upon moderate exertion. She describes this as chronic in nature  having been present for several years. She has associated fatigue, palpitations, dizziness, headaches, anxiety and chronic difficulty sleeping along with this. She denies any abdominal distention, edema, chest pain, cough or weight gain. Does wear her oxygen at 2L at bedtime, dialysis and as needed during the day.    Past Medical History:  Diagnosis Date  . Asthma   . CHF (congestive heart failure) ([place])   . COPD (chronic obstructive pulmonary disease) ([place])   . Diabetes mellitus without complication ([place])   . Diastolic heart failure ([place])   . ESRD (end stage renal disease) on dialysis ([place])   . HIV (human immunodeficiency virus infection) ([place])   . Hypertension   . Neuropathy   . Polycystic kidney disease   . Renal insufficiency     Past Surgical History:  Procedure Laterality Date  . ABDOMINAL SURGERY    . ARTERIOVENOUS GRAFT PLACEMENT Right    x3 (R forearm currently used for access)  . BREAST BIOPSY Left 12/03/2015   neg  . COLON SURGERY    . ESOPHAGOGASTRODUODENOSCOPY (EGD) WITH PROPOFOL N/A 02/04/2017   Procedure: ESOPHAGOGASTRODUODENOSCOPY (EGD) WITH PROPOFOL;  Surgeon: Jonathon Bellows, MD;  Location[facility] ENDOSCOPY;  Service: Endoscopy;  Laterality: N/A;  . PERIPHERAL VASCULAR CATHETERIZATION Right 08/04/2016   Procedure: A/V Shuntogram;  Surgeon: Algernon Huxley, MD;  Location: [place] CV LAB;  Service: Cardiovascular;  Laterality: Right;  . VEIN HARVEST      Family History  Problem Relation Age of Onset  . Hypertension Brother   . Heart failure Brother   . Hypertension Mother   .  Heart disease Mother     Social History   Tobacco Use  . Smoking status: Former Smoker    Packs/day: 0.25    Last attempt to quit: 08/11/2015    Years since quitting: 2.2  . Smokeless tobacco: Never Used  Substance Use Topics  . Alcohol use: No    Alcohol/week: 0.0 oz    Allergies  Allergen Reactions  . Morphine And Related Nausea And Vomiting  . Strawberry Extract   . Tramadol  Nausea And Vomiting  . Venofer [Iron Sucrose]   . Buprenorphine Hcl Nausea And Vomiting  . Morphine Nausea And Vomiting    Halllucinations   Prior to Admission medications   Medication Sig Start Date End Date Taking? Authorizing Provider  acetaminophen (TYLENOL) 325 MG tablet Take 2 tablets (650 mg total) by mouth every 6 (six) hours as needed for mild pain or headache. 03/18/16  Yes Gouru, Aruna, MD  amitriptyline (ELAVIL) 10 MG tablet Take 10 mg by mouth at bedtime as needed for sleep.   Yes [provider]  amLODipine (NORVASC) 5 MG tablet Take 5 mg daily by mouth.  08/31/14  Yes [provider]  aspirin 81 MG chewable tablet Chew 1 tablet (81 mg total) by mouth daily. 01/17/16  Yes Gladstone Lighter, MD  b complex-vitamin c-folic acid (NEPHRO-VITE) 0.8 MG TABS tablet Take 1 tablet by mouth daily.    Yes [provider]  calcium acetate (PHOSLO) 667 MG capsule Take 2 capsules (1,334 mg total) by mouth 3 (three) times daily with meals. 11/10/17  Yes Wieting, Richard, MD  cloNIDine (CATAPRES) 0.1 MG tablet Take 1 tablet (0.1 mg total) by mouth 2 (two) times daily. 01/17/16  Yes Gladstone Lighter, MD  epoetin alfa (EPOGEN,PROCRIT) 34196 UNIT/ML injection 10,000 Units 3 (three) times a week.   Yes [provider]  furosemide (LASIX) 80 MG tablet Take 1 tablet (80 mg total) by mouth daily. 03/05/16 11/17/17 Yes Hackney, Otila Kluver A, FNP  gabapentin (NEURONTIN) 300 MG capsule Take 300 mg 2 (two) times daily by mouth.    Yes [provider]  hydrALAZINE (APRESOLINE) 100 MG tablet Take 1 tablet (100 mg total) by mouth 3 (three) times daily. 09/07/17  Yes Mody, Sital, MD  lidocaine (XYLOCAINE) 5 % ointment Apply 1 application topically daily as needed.    Yes [provider]  losartan (COZAAR) 100 MG tablet Take 1 tablet (100 mg total) by mouth daily. 09/07/17  Yes Mody, Ulice Bold, MD  metoprolol succinate (TOPROL-XL) 50 MG 24 hr tablet Take 50 mg daily by mouth.     Yes [provider]  nitroGLYCERIN (NITROSTAT) 0.4 MG SL tablet Place 1 tablet (0.4 mg total) under the tongue every 5 (five) minutes as needed for chest pain. 01/14/17 09/17/18 Yes Gollan, Kathlene November, MD  omeprazole (PRILOSEC) 20 MG capsule Take 1 capsule by mouth daily. 08/06/17  Yes [provider]  ondansetron (ZOFRAN) 4 MG tablet Take 1 tablet (4 mg total) by mouth every 8 (eight) hours as needed for nausea or vomiting. 07/21/16  Yes Daymon Larsen, MD  oxyCODONE-acetaminophen (PERCOCET) 10-325 MG tablet Take 1 tablet by mouth daily as needed for pain.   Yes [provider]  protein supplement shake (PREMIER PROTEIN) LIQD Take 2 oz by mouth 3 (three) times a week. Tuesday, Thursday, Saturday   Yes [provider]  SENSIPAR 30 MG tablet Take 1 tablet (30 mg total) by mouth daily. 07/07/16  Yes Fritzi Mandes, MD  VENTOLIN HFA 108 (  90 Base) MCG/ACT inhaler Inhale 2 puffs into the lungs daily as needed.  05/21/16  Yes [provider]    Review of Systems  Constitutional: Positive for fatigue. Negative for appetite change.  HENT: Negative for congestion, nosebleeds, sinus pressure and sore throat.   Eyes: Positive for visual disturbance (blurry vision). Negative for pain.  Respiratory: Positive for shortness of breath. Negative for cough and chest tightness.   Cardiovascular: Positive for palpitations (when laying on the left side). Negative for chest pain and leg swelling.  Gastrointestinal: Negative for abdominal distention and abdominal pain.  Endocrine: Negative.   Genitourinary: Negative.   Musculoskeletal: Negative for back pain and neck pain.  Skin: Negative.   Allergic/Immunologic: Negative.   Neurological: Positive for dizziness, numbness (tingling down left arm at times due to previous graft) and headaches. Negative for light-headedness.  Hematological: Negative for adenopathy. Does not bruise/bleed easily.  Psychiatric/Behavioral: Positive  for sleep disturbance (sleeping on 2 pillows). Negative for dysphoric mood. The patient is nervous/anxious (at times).    Vitals:   11/17/17 0912  BP: (!) 144/88  Pulse: 63  Resp: 18  SpO2: 94%  Weight: 179 lb 4 oz (81.3 kg)  Height: 5\' 3"  (1.6 m)   Wt Readings from Last 3 Encounters:  11/17/17 179 lb 4 oz (81.3 kg)  11/10/17 173 lb 1 oz (78.5 kg)  10/16/17 179 lb (81.2 kg)   Lab Results  Component Value Date   CREATININE 14.43 (H) 11/09/2017   CREATININE 12.94 (H) 11/08/2017   CREATININE 4.81 (H) 10/16/2017    Objective:   Physical Exam  Constitutional: She is oriented to person, place, and time. She appears well-developed and well-nourished.  HENT:  Head: Normocephalic and atraumatic.  Neck: Normal range of motion. Neck supple.  Cardiovascular: Normal rate and regular rhythm.  Pulmonary/Chest: Effort normal. She has no wheezes. She has no rales.  Abdominal: Soft. She exhibits no distension. There is no tenderness.  Musculoskeletal: She exhibits no edema or tenderness.  Neurological: She is alert and oriented to person, place, and time.  Skin: Skin is warm and dry.  Psychiatric: She has a normal mood and affect. Her behavior is normal. Thought content normal.  Nursing note and vitals reviewed.     Assessment & Plan:  1: Chronic heart failure with preserved ejection fraction-   - NYHA class II - euvolemic today  - weighing daily; Reminded to call for an overnight weight gain of >2 pounds or a weekly weight gain of >5 pounds - weight up 2 pounds since she was last here - maintaining a 32 ounce fluid restriction per nephrology  - wearing oxygent at 2L at bedtime,during dialysis & as needed during the day - saw cardiologist Jasmine Holloway) 01/14/17 & follow-up appointment scheduled with him on 11/29/17 - not adding salt and is trying to eat low sodium foods. Reminded about the importance of following a 2000mg  sodium diet. She says that she's being diligent about reading food labels   - says that she's no longer eating cookies, chips, doughnuts etc - BNP on 11/08/17 was 599.0 - PharmD reconciled medications with the patient  2: HTN-  - BP looks good today - BMP on 11/09/17 reviewed and showed sodium 144, potassium 5.9 and GFR 3  3.ESRD on dialysis-  - dialysis on T, TH, Sat - on the transplant list and has information to sign up for a living donor - reports having good support system at home for when she does get her transplant  4: Lymphedema- - stage 2 - has worn compression socks but says that they "cut into" her legs and made her legs hurt - does elevate her legs numerous times during the day - wearing compression boots every other day and wears them for extended hours on non-dialysis days - due to dialysis 3 days/week, patient is unable to exercise consistently. When she does walk for exercise, her edema continues.  Patient did not bring her medications nor a list. Each medication was verbally reviewed with the patient and she was encouraged to bring the bottles to every visit to confirm accuracy of list.  Return in 1 month or sooner for any questions/problems before then.

## 2017-11-16 ENCOUNTER — Encounter: Admit: 2017-11-16 | Discharge: 2017-11-16 | Payer: 59 | Attending: Nephrology | Primary: Nephrology

## 2017-11-17 ENCOUNTER — Ambulatory Visit: Payer: Medicare Other | Attending: Family | Admitting: Family

## 2017-11-17 ENCOUNTER — Encounter: Payer: Self-pay | Admitting: Family

## 2017-11-17 VITALS — BP 144/88 | HR 63 | Resp 18 | Ht 63.0 in | Wt 179.2 lb

## 2017-11-17 DIAGNOSIS — Z87891 Personal history of nicotine dependence: Secondary | ICD-10-CM | POA: Diagnosis not present

## 2017-11-17 DIAGNOSIS — R0609 Other forms of dyspnea: Secondary | ICD-10-CM | POA: Diagnosis not present

## 2017-11-17 DIAGNOSIS — Z7682 Awaiting organ transplant status: Secondary | ICD-10-CM | POA: Diagnosis not present

## 2017-11-17 DIAGNOSIS — R51 Headache: Secondary | ICD-10-CM | POA: Diagnosis not present

## 2017-11-17 DIAGNOSIS — I132 Hypertensive heart and chronic kidney disease with heart failure and with stage 5 chronic kidney disease, or end stage renal disease: Secondary | ICD-10-CM | POA: Insufficient documentation

## 2017-11-17 DIAGNOSIS — I10 Essential (primary) hypertension: Secondary | ICD-10-CM

## 2017-11-17 DIAGNOSIS — R0602 Shortness of breath: Secondary | ICD-10-CM | POA: Diagnosis present

## 2017-11-17 DIAGNOSIS — E1122 Type 2 diabetes mellitus with diabetic chronic kidney disease: Secondary | ICD-10-CM | POA: Insufficient documentation

## 2017-11-17 DIAGNOSIS — Q613 Polycystic kidney, unspecified: Secondary | ICD-10-CM | POA: Insufficient documentation

## 2017-11-17 DIAGNOSIS — R002 Palpitations: Secondary | ICD-10-CM | POA: Diagnosis not present

## 2017-11-17 DIAGNOSIS — I5032 Chronic diastolic (congestive) heart failure: Secondary | ICD-10-CM | POA: Insufficient documentation

## 2017-11-17 DIAGNOSIS — E1140 Type 2 diabetes mellitus with diabetic neuropathy, unspecified: Secondary | ICD-10-CM | POA: Diagnosis not present

## 2017-11-17 DIAGNOSIS — Z992 Dependence on renal dialysis: Secondary | ICD-10-CM | POA: Insufficient documentation

## 2017-11-17 DIAGNOSIS — Z885 Allergy status to narcotic agent status: Secondary | ICD-10-CM | POA: Diagnosis not present

## 2017-11-17 DIAGNOSIS — Z7982 Long term (current) use of aspirin: Secondary | ICD-10-CM | POA: Insufficient documentation

## 2017-11-17 DIAGNOSIS — B20 Human immunodeficiency virus [HIV] disease: Secondary | ICD-10-CM | POA: Diagnosis not present

## 2017-11-17 DIAGNOSIS — F419 Anxiety disorder, unspecified: Secondary | ICD-10-CM | POA: Diagnosis not present

## 2017-11-17 DIAGNOSIS — N186 End stage renal disease: Secondary | ICD-10-CM

## 2017-11-17 DIAGNOSIS — Z79899 Other long term (current) drug therapy: Secondary | ICD-10-CM | POA: Diagnosis not present

## 2017-11-17 DIAGNOSIS — Z888 Allergy status to other drugs, medicaments and biological substances status: Secondary | ICD-10-CM | POA: Diagnosis not present

## 2017-11-17 DIAGNOSIS — I890 Lymphedema, not elsewhere classified: Secondary | ICD-10-CM | POA: Diagnosis not present

## 2017-11-17 DIAGNOSIS — R42 Dizziness and giddiness: Secondary | ICD-10-CM | POA: Diagnosis not present

## 2017-11-17 NOTE — Patient Instructions (Signed)
Continue weighing daily and call for an overnight weight gain of > 2 pounds or a weekly weight gain of >5 pounds. 

## 2017-11-19 DIAGNOSIS — Z7682 Awaiting organ transplant status: Principal | ICD-10-CM

## 2017-11-19 DIAGNOSIS — N186 End stage renal disease: Secondary | ICD-10-CM

## 2017-11-19 DIAGNOSIS — Z01818 Encounter for other preprocedural examination: Secondary | ICD-10-CM

## 2017-11-26 NOTE — Progress Notes (Deleted)
Cardiology Office Note  Date:  11/26/2017   ID:  Jasmine Holloway, DOB 1960-12-08, MRN [id number]  PCP:  Theotis Burrow, MD   No chief complaint on file.   HPI:  Jasmine Holloway  is a 58 y.o. female with a known history of  Asthma,  COPD,  DM,  ESRD on HD on T,T,S, on transplant list HTN ,  Catheterization by outside group 03/17/2016 showing no coronary disease  Polycystic kidney disease,  Hospital admissions for fluid overload. On hemodialysis Tuesday, Thursday, Saturday  chronic left hand pain, ruptured AV access left arm,  Previous Complications for chemical stress test Currently has HD fistula right forearm,  She has chronic cramping , followed by the pain clinic Reports she is On transplant list for kidney  who presents  for follow-up of her chest pain  Emergency room visit 11/14/2016 She reported having  Chest pain during dialysis Hospital records reviewed with the patient in detail BNP improved down to 220, was greater than 1700 last year Receiving dialysis, woke up to chest pain, happen before Workup in the emergency room was unrevealing, cardiac enzymes negative  Did not take her medications this morning, transportation arrived to early She has already completed dialysis this morning and is going home Still has some leg swelling, shortness of breath Dialysis being terminated prematurely secondary to cramping in her legs  She does take Lasix at times for swelling   hospital admission June 2017 for shortness of breath, started on BiPAP  she has chronic Anemia, HBG 9.9 (stable)  EKG personally reviewed by myself on todays visit NSR with rate 74 bpm with prolonged QTc  Other past medical history reviewed Echo 01/11/2016 moderate LVH, ejection fraction not detailed in the report though presumed to be normal , fractional shortening suggesting normal EF  Left AV fistula not working, chronic pain ("blew out in [place]"), scar tissue,  Trouble moving fingers left  hand  Seen by pain clinic for pain in her left pectoral, left arm,  reports she only has onepercocet/oxycodone provided per day . She does not feel this is enough . No other options were provided per the patient     PMH:   has a past medical history of Asthma, CHF (congestive heart failure) ([place]), COPD (chronic obstructive pulmonary disease) ([place]), Diabetes mellitus without complication ([place]), Diastolic heart failure ([place]), ESRD (end stage renal disease) on dialysis ([place]), HIV (human immunodeficiency virus infection) ([place]), Hypertension, Neuropathy, Polycystic kidney disease, and Renal insufficiency.  PSH:    Past Surgical History:  Procedure Laterality Date  . ABDOMINAL SURGERY    . ARTERIOVENOUS GRAFT PLACEMENT Right    x3 (R forearm currently used for access)  . BREAST BIOPSY Left 12/03/2015   neg  . COLON SURGERY    . ESOPHAGOGASTRODUODENOSCOPY (EGD) WITH PROPOFOL N/A 02/04/2017   Procedure: ESOPHAGOGASTRODUODENOSCOPY (EGD) WITH PROPOFOL;  Surgeon: Jonathon Bellows, MD;  Location: [facility] ENDOSCOPY;  Service: Endoscopy;  Laterality: N/A;  . PERIPHERAL VASCULAR CATHETERIZATION Right 08/04/2016   Procedure: A/V Shuntogram;  Surgeon: Algernon Huxley, MD;  Location: [place] CV LAB;  Service: Cardiovascular;  Laterality: Right;  . VEIN HARVEST      Current Outpatient Medications  Medication Sig Dispense Refill  . acetaminophen (TYLENOL) 325 MG tablet Take 2 tablets (650 mg total) by mouth every 6 (six) hours as needed for mild pain or headache.    Marland Kitchen amitriptyline (ELAVIL) 10 MG tablet Take 10 mg by mouth at bedtime as needed for sleep.    Marland Kitchen  amLODipine (NORVASC) 5 MG tablet Take 5 mg daily by mouth.     Marland Kitchen aspirin 81 MG chewable tablet Chew 1 tablet (81 mg total) by mouth daily. 30 tablet 2  . b complex-vitamin c-folic acid (NEPHRO-VITE) 0.8 MG TABS tablet Take 1 tablet by mouth daily.     . calcium acetate (PHOSLO) 667 MG capsule Take 2 capsules (1,334 mg total) by mouth 3 (three) times daily  with meals.    . cloNIDine (CATAPRES) 0.1 MG tablet Take 1 tablet (0.1 mg total) by mouth 2 (two) times daily. 60 tablet 2  . epoetin alfa (EPOGEN,PROCRIT) 55732 UNIT/ML injection 10,000 Units 3 (three) times a week.    . furosemide (LASIX) 80 MG tablet Take 1 tablet (80 mg total) by mouth daily. 90 tablet 3  . gabapentin (NEURONTIN) 300 MG capsule Take 300 mg 2 (two) times daily by mouth.     . hydrALAZINE (APRESOLINE) 100 MG tablet Take 1 tablet (100 mg total) by mouth 3 (three) times daily. 90 tablet 0  . lidocaine (XYLOCAINE) 5 % ointment Apply 1 application topically daily as needed.     Marland Kitchen losartan (COZAAR) 100 MG tablet Take 1 tablet (100 mg total) by mouth daily. 30 tablet 0  . metoprolol succinate (TOPROL-XL) 50 MG 24 hr tablet Take 50 mg daily by mouth.     . nitroGLYCERIN (NITROSTAT) 0.4 MG SL tablet Place 1 tablet (0.4 mg total) under the tongue every 5 (five) minutes as needed for chest pain. 25 tablet 3  . omeprazole (PRILOSEC) 20 MG capsule Take 1 capsule by mouth daily.    . ondansetron (ZOFRAN) 4 MG tablet Take 1 tablet (4 mg total) by mouth every 8 (eight) hours as needed for nausea or vomiting. 21 tablet 0  . oxyCODONE-acetaminophen (PERCOCET) 10-325 MG tablet Take 1 tablet by mouth daily as needed for pain.    . protein supplement shake (PREMIER PROTEIN) LIQD Take 2 oz by mouth 3 (three) times a week. Tuesday, Thursday, Saturday    . SENSIPAR 30 MG tablet Take 1 tablet (30 mg total) by mouth daily. 60 tablet 1  . VENTOLIN HFA 108 (90 Base) MCG/ACT inhaler Inhale 2 puffs into the lungs daily as needed.      No current facility-administered medications for this visit.      Allergies:   Morphine and related; Strawberry extract; Tramadol; Venofer [iron sucrose]; Buprenorphine hcl; and Morphine   Social History:  The patient  reports that she quit smoking about 2 years ago. She smoked 0.25 packs per day. She has never used smokeless tobacco. She reports that she does not drink  alcohol or use drugs.   Family History:   family history includes Heart disease in her mother; Heart failure in her brother; Hypertension in her brother and mother.    Review of Systems: Review of Systems  Constitutional: Negative.   Respiratory: Positive for shortness of breath.   Cardiovascular: Positive for chest pain and leg swelling.  Gastrointestinal: Negative.   Musculoskeletal: Positive for joint pain.       Arm pain, muscle cramps  Neurological: Negative.   Psychiatric/Behavioral: Negative.   All other systems reviewed and are negative.    PHYSICAL EXAM: VS:  There were no vitals taken for this visit. , BMI There is no height or weight on file to calculate BMI. GEN: Well nourished, well developed, in no acute distress , talkative, obese  HEENT: normal  Neck: no JVD, carotid bruits, or masses Cardiac: RRR;  no murmurs, rubs, or gallops, trace nonpitting lower extremity edema  Respiratory:  clear to auscultation bilaterally, normal work of breathing GI: soft, nontender, nondistended, + BS MS: no deformity or atrophy  Skin: warm and dry, no rash Neuro:  Strength and sensation are intact Psych: euthymic mood, full affect    Recent Labs: 08/05/2017: ALT 14 11/08/2017: B Natriuretic Peptide 599.0 11/09/2017: BUN 85; Creatinine, Ser 14.43; Hemoglobin 9.2; Platelets 330; Potassium 4.2; Sodium 144    Lipid Panel Lab Results  Component Value Date   CHOL 161 11/10/2016   HDL 36 (L) 11/10/2016   LDLCALC 92 11/10/2016   TRIG 165 (H) 11/10/2016      Wt Readings from Last 3 Encounters:  11/17/17 179 lb 4 oz (81.3 kg)  11/10/17 173 lb 1 oz (78.5 kg)  10/16/17 179 lb (81.2 kg)       ASSESSMENT AND PLAN:  Chronic diastolic heart failure (HCC) - Plan: EKG 12-Lead Fluid status managed by hemodialysis. 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 She sees the pain clinic, does not feel she has enough pain medication Recently seen in the emergency room for atypical chest pain Previous cardiac catheterization showing no significant coronary disease  ESRD on dialysis ([place]) 3 days per week, also makes urine, on Lasix  Chronic pain syndrome Managed with pain medication, she has chronic cramping during dialysis  Anemia in stage 4 chronic kidney disease (HCC)  anemia, likely contributing to shortness of breath, leg swelling, chronic CHF symptoms   Total encounter time more than 25 minutes  Greater than 50% was spent in counseling and coordination of care with the patient   Disposition:   F/U  6 months   No orders of the defined types were placed in this encounter.    Signed, Esmond Plants, M.D., Ph.D. 11/26/2017  [place], [place]

## 2017-11-29 ENCOUNTER — Ambulatory Visit: Payer: Medicare Other | Admitting: Cardiovascular Disease

## 2017-12-07 NOTE — Progress Notes (Signed)
Cardiology Office Note  Date:  12/08/2017   ID:  Jasmine Holloway, DOB 06/08/1961, MRN [id number]  PCP:  Theotis Burrow, MD   Chief Complaint  Patient presents with  . Other    ED follow up for chest pain. Meds reviewed verbally with patient.     HPI:  Jasmine Holloway  is a 57 y.o. female with a known history of  Asthma,  COPD,  DM,  ESRD on HD on T,T,S, on transplant list HTN ,  Catheterization by outside group 03/17/2016 showing no coronary disease  Polycystic kidney disease,  Hospital admissions for fluid overload. On hemodialysis Tuesday, Thursday, Saturday  chronic left hand pain, ruptured AV access left arm,  Previous Complications for chemical stress test Currently has HD fistula right forearm,  She has chronic cramping , followed by the pain clinic Reports she is On transplant list for kidney  who presents  for follow-up of her chest pain  Frequent visits to the emergency room for various issues Predominantly for fluid retention leading to chest tightness  Other complaint is reports having rare episodes of chest fluttering Last episode was 1 month ago "really fast" Concerned what it could be  Given several hospitalizations her hemodialysis time has been increased HD 4 hrs three x a week urinates thick foul-smelling continues to take Lasix urine every 2 days small amount   In the hospital 11/10/2017 Acute on chronic respiratory failure Acute fluid overload and diastolic congestive heart failure --required BiPAP  "HD tech made a mistake" HD was short for some reason HD now running longer  ER 10/16/2017 Chest pain Hospital records reviewed with the patient in [facility] 09/2017 pulm edema Hospital records reviewed with the patient in detail noncompliance with fluids by the patient. Had HD x 2  hosp 08/2017 Hospital records reviewed with the patient in detail underwent hemodialysis and her symptoms have improved.   EKG personally reviewed by myself on  todays visit Shows NSR rate 67 bpm, T wave ABN anterolateral leads   Other past medical history reviewed Emergency room visit 11/14/2016 She reported having  Chest pain during dialysis Hospital records reviewed with the patient in detail BNP improved down to 220, was greater than 1700 last year Receiving dialysis, woke up to chest pain, happen before Workup in the emergency room was unrevealing, cardiac enzymes negative   hospital admission June 2017 for shortness of breath, started on BiPAP  she has chronic Anemia, HBG 9.9 (stable)  Echo 01/11/2016 moderate LVH, ejection fraction not detailed in the report though presumed to be normal , fractional shortening suggesting normal EF  Left AV fistula not working, chronic pain ("blew out in [place]"), scar tissue,  Trouble moving fingers left hand  Seen by pain clinic for pain in her left pectoral, left arm,  reports she only has onepercocet/oxycodone provided per day . She does not feel this is enough . No other options were provided per the patient     PMH:   has a past medical history of Asthma, CHF (congestive heart failure) ([employer]), COPD (chronic obstructive pulmonary disease) ([place]), Diabetes mellitus without complication ([place]), Diastolic heart failure ([place]), ESRD (end stage renal disease) on dialysis ([place]), HIV (human immunodeficiency virus infection) ([place]), Hypertension, Neuropathy, Polycystic kidney disease, and Renal insufficiency.  PSH:    Past Surgical History:  Procedure Laterality Date  . ABDOMINAL SURGERY    . ARTERIOVENOUS GRAFT PLACEMENT Right    x3 (R forearm currently used for access)  . BREAST BIOPSY  Left 12/03/2015   neg  . COLON SURGERY    . ESOPHAGOGASTRODUODENOSCOPY (EGD) WITH PROPOFOL N/A 02/04/2017   Procedure: ESOPHAGOGASTRODUODENOSCOPY (EGD) WITH PROPOFOL;  Surgeon: Jonathon Bellows, MD;  [facility] END[facility];  Service: Endoscopy;  Laterality: N/A;  . PERIPHERAL VASCULAR CATHETERIZATION Right 08/04/2016    Procedure: A/V Shuntogram;  Surgeon: Algernon Huxley, MD;  Location: [place] CV LAB;  Service: Cardiovascular;  Laterality: Right;  . VEIN HARVEST      Current Outpatient Medications  Medication Sig Dispense Refill  . acetaminophen (TYLENOL) 325 MG tablet Take 2 tablets (650 mg total) by mouth every 6 (six) hours as needed for mild pain or headache.    Marland Kitchen amitriptyline (ELAVIL) 10 MG tablet Take 10 mg by mouth at bedtime as needed for sleep.    Marland Kitchen amLODipine (NORVASC) 5 MG tablet Take 5 mg daily by mouth.     Marland Kitchen aspirin 81 MG chewable tablet Chew 1 tablet (81 mg total) by mouth daily. 30 tablet 2  . b complex-vitamin c-folic acid (NEPHRO-VITE) 0.8 MG TABS tablet Take 1 tablet by mouth daily.     . calcium acetate (PHOSLO) 667 MG capsule Take 2 capsules (1,334 mg total) by mouth 3 (three) times daily with meals.    . cloNIDine (CATAPRES) 0.1 MG tablet Take 1 tablet (0.1 mg total) by mouth 2 (two) times daily. 60 tablet 2  . epoetin alfa (EPOGEN,PROCRIT) 85631 UNIT/ML injection 10,000 Units 3 (three) times a week.    . gabapentin (NEURONTIN) 300 MG capsule Take 300 mg 2 (two) times daily by mouth.     . hydrALAZINE (APRESOLINE) 100 MG tablet Take 1 tablet (100 mg total) by mouth 3 (three) times daily. 90 tablet 0  . lidocaine (XYLOCAINE) 5 % ointment Apply 1 application topically daily as needed.     Marland Kitchen losartan (COZAAR) 100 MG tablet Take 1 tablet (100 mg total) by mouth daily. 30 tablet 0  . metoprolol succinate (TOPROL-XL) 50 MG 24 hr tablet Take 50 mg daily by mouth.     . nitroGLYCERIN (NITROSTAT) 0.4 MG SL tablet Place 1 tablet (0.4 mg total) under the tongue every 5 (five) minutes as needed for chest pain. 25 tablet 3  . omeprazole (PRILOSEC) 20 MG capsule Take 1 capsule by mouth daily.    . ondansetron (ZOFRAN) 4 MG tablet Take 1 tablet (4 mg total) by mouth every 8 (eight) hours as needed for nausea or vomiting. 21 tablet 0  . oxyCODONE-acetaminophen (PERCOCET) 10-325 MG tablet Take 1  tablet by mouth daily as needed for pain.    . protein supplement shake (PREMIER PROTEIN) LIQD Take 2 oz by mouth 3 (three) times a week. Tuesday, Thursday, Saturday    . SENSIPAR 30 MG tablet Take 1 tablet (30 mg total) by mouth daily. 60 tablet 1  . VENTOLIN HFA 108 (90 Base) MCG/ACT inhaler Inhale 2 puffs into the lungs daily as needed.     . furosemide (LASIX) 80 MG tablet Take 1 tablet (80 mg total) by mouth daily. 90 tablet 3   No current facility-administered medications for this visit.      Allergies:   Morphine and related; Strawberry extract; Tramadol; Venofer [iron sucrose]; Buprenorphine hcl; and Morphine   Social History:  The patient  reports that she quit smoking about 2 years ago. She smoked 0.25 packs per day. She has never used smokeless tobacco. She reports that she does not drink alcohol or use drugs.   Family History:  family history includes Heart disease in her mother; Heart failure in her brother; Hypertension in her brother and mother.    Review of Systems: Review of Systems  Constitutional: Negative.   Respiratory: Positive for shortness of breath.   Cardiovascular: Positive for palpitations.  Gastrointestinal: Negative.   Musculoskeletal:       Pain in her left axilla  Neurological: Negative.   Psychiatric/Behavioral: Negative.   All other systems reviewed and are negative.    PHYSICAL EXAM: VS:  BP 118/70 (BP Location: Left Arm, Patient Position: Sitting, Cuff Size: Normal)   Pulse 67   Ht 5\' 3"  (1.6 m)   Wt 179 lb 4 oz (81.3 kg)   BMI 31.75 kg/m  , BMI Body mass index is 31.75 kg/m. Constitutional:  oriented to person, place, and time. No distress.  HENT:  Head: Normocephalic and atraumatic.  Eyes:  no discharge. No scleral icterus.  Neck: Normal range of motion. Neck supple. No JVD present.  Cardiovascular: Normal rate, regular rhythm, normal heart sounds and intact distal pulses. Exam reveals no gallop and no friction rub. No edema No  murmur heard. Pulmonary/Chest: Effort normal and breath sounds normal. No stridor. No respiratory distress.  no wheezes.  no rales.  no tenderness.  Abdominal: Soft.  no distension.  no tenderness.  Musculoskeletal: Normal range of motion.  no  tenderness or deformity.  Neurological:  normal muscle tone. Coordination normal. No atrophy Skin: Skin is warm and dry. No rash noted. not diaphoretic.  Psychiatric:  normal mood and affect. behavior is normal. Thought content normal.    Recent Labs: 08/05/2017: ALT 14 11/08/2017: B Natriuretic Peptide 599.0 11/09/2017: BUN 85; Creatinine, Ser 14.43; Hemoglobin 9.2; Platelets 330; Potassium 4.2; Sodium 144    Lipid Panel Lab Results  Component Value Date   CHOL 161 11/10/2016   HDL 36 (L) 11/10/2016   LDLCALC 92 11/10/2016   TRIG 165 (H) 11/10/2016      Wt Readings from Last 3 Encounters:  12/08/17 179 lb 4 oz (81.3 kg)  11/17/17 179 lb 4 oz (81.3 kg)  11/10/17 173 lb 1 oz (78.5 kg)       ASSESSMENT AND PLAN:  Chronic diastolic heart failure (HCC) - Plan: EKG 12-Lead Fluid status managed by hemodialysis. Hemodialysis time recently increased up to 4 hours given recent frequent hospitalizations/ When she has fluid overload often has chest tightness Does have high fluid intake Discussed with her need to limit her fluid intake Does not make any urine with Lasix.  Recommended she discuss with nephrology, could probably stop the Lasix. she has minimal urination production every 2 days  Benign essential HTN - Plan: EKG 12-Lead Blood pressure is well controlled on today's visit. No changes made to the medications.  Atypical chest pain Left pectoral pain, chronic pain from failed AV fistula. She sees the pain clinic Suspect scar tissue from previous surgeries  ESRD on dialysis ([place]) 3 days per week, also makes urine, on Lasix On the transplant list  Chronic pain syndrome Managed with pain medication, she has chronic cramping during  dialysis  stable  Anemia in stage 4 chronic kidney disease (HCC)  anemia, likely contributing to shortness of breath, leg swelling, chronic CHF symptoms  Palpitations  somewhat atypical in nature but she is concerned about arrhythmia Event monitor has been placed for 2 weeks She is high risk of arrhythmia given fluctuations in her electrolytes  Extensive hospital admission review discussed with her in detail  Total encounter time more  than 45 minutes  Greater than 50% was spent in counseling and coordination of care with the patient   Disposition:   F/U  12 months   Orders Placed This Encounter  Procedures  . LONG TERM MONITOR (3-14 DAYS)  . EKG 12-Lead     Signed, Esmond Plants, M.D., Ph.D. 12/08/2017  [place], [place]

## 2017-12-08 ENCOUNTER — Ambulatory Visit (INDEPENDENT_AMBULATORY_CARE_PROVIDER_SITE_OTHER): Payer: Medicare Other

## 2017-12-08 ENCOUNTER — Encounter: Payer: Self-pay | Admitting: Cardiovascular Disease

## 2017-12-08 ENCOUNTER — Ambulatory Visit (INDEPENDENT_AMBULATORY_CARE_PROVIDER_SITE_OTHER): Payer: Medicare Other | Admitting: Cardiovascular Disease

## 2017-12-08 VITALS — BP 118/70 | HR 67 | Ht 63.0 in | Wt 179.2 lb

## 2017-12-08 DIAGNOSIS — J432 Centrilobular emphysema: Secondary | ICD-10-CM | POA: Diagnosis not present

## 2017-12-08 DIAGNOSIS — R002 Palpitations: Secondary | ICD-10-CM

## 2017-12-08 DIAGNOSIS — I10 Essential (primary) hypertension: Secondary | ICD-10-CM | POA: Diagnosis not present

## 2017-12-08 DIAGNOSIS — G894 Chronic pain syndrome: Secondary | ICD-10-CM | POA: Diagnosis not present

## 2017-12-08 DIAGNOSIS — I5032 Chronic diastolic (congestive) heart failure: Secondary | ICD-10-CM | POA: Diagnosis not present

## 2017-12-08 DIAGNOSIS — I890 Lymphedema, not elsewhere classified: Secondary | ICD-10-CM

## 2017-12-08 DIAGNOSIS — E1122 Type 2 diabetes mellitus with diabetic chronic kidney disease: Secondary | ICD-10-CM

## 2017-12-08 DIAGNOSIS — Z992 Dependence on renal dialysis: Secondary | ICD-10-CM

## 2017-12-08 DIAGNOSIS — N186 End stage renal disease: Secondary | ICD-10-CM | POA: Diagnosis not present

## 2017-12-08 DIAGNOSIS — E782 Mixed hyperlipidemia: Secondary | ICD-10-CM | POA: Diagnosis not present

## 2017-12-08 NOTE — Patient Instructions (Addendum)
Medication Instructions:   No medication changes made  Labwork:  No new labs needed  Testing/Procedures:  zio monitor for tachycardia, fluttering   Follow-Up: It was a pleasure seeing you in the office today. Please call us if you have new issues that need to be addressed before your next appt.  [phone]  Your physician wants you to follow-up in: 12 months.  You will receive a reminder letter in the mail two months in advance. If you don't receive a letter, please call our office to schedule the follow-up appointment.  If you need a refill on your cardiac medications before your next appointment, please call your pharmacy.  For educational health videos Log in to : [email or web address] Or : [email or web address], password : [email or web address]

## 2017-12-19 NOTE — Progress Notes (Signed)
Subjective:    Patient ID: Jasmine Holloway, female    DOB: Nov 08, 1960, 57 y.o.   MRN: [id number]  HPI   Jasmine Holloway is a 56 y/o female with a history of asthma, DM, HTN, kidney disease (dialysis), COPD, previous tobacco use and chronic heart failure.   Echo report from 09/21/17 reviewed and showed an EF of 60-65%. Reviewed echo report on 11/10/16 which showed an EF of 50-55% along with mild AR/MR. EF was 55-60% May 2017. Cardiac catheterization was done 03/17/16 and showed normal coronary arteries.   Admitted 11/08/17 due to acute on chronic hypoxic respiratory failure due to fluid overload. Initially needed bipap and then transitioned to her baseline nasal cannula. Case discussed with nephrology who recommended extended dialysis time. Discharged after 2 days. Was in the ED 10/16/17 due to chest wall pain where she was evaluated and released. Admitted 09/21/17 due to acute on chronic heart failure due to fluid intake noncompliance. Extra dialysis sessions done and she was counseled regarding fluid intake. Discharged the next day.  Admitted 09/06/17 due to HF exacerbation due to accelerated HTN. Cardiology consult was obtained. Elevated troponin thought to be due to poor renal clearance. Dialysis was done and patient was discharged the next day. Was in the ED 08/05/17 due to headache where she was treated and released. Was in the ED 07/21/17 with edema where she was treated and released. Was in the ED 07/05/17 with COPD exacerbation. Was given bipap, nebulizers and steroids. Was released with oral antibiotics. Admitted 06/15/17 due to HF exacerbation. Chest CT confirmed HF. Initially needed IV furosemide and then transitioned back to her oral diuretics. Also needed IV steroids and nebulizers initially. Nephrology was consulted and she was discharged home in 2 days.   She presents today for a follow-up visit with a chief complaint of minimal fatigue upon moderate exertion. She describes this as chronic in nature having  been present for several years. She has associated blurry vision, shortness of breath, dizziness, left arm numbness, intermittent chest pain, palpitations and difficulty sleeping along with this. She denies any abdominal distention, pedal edema, cough or weight gain.   Past Medical History:  Diagnosis Date  . Asthma   . CHF (congestive heart failure) ([place])   . COPD (chronic obstructive pulmonary disease) ([place])   . Diabetes mellitus without complication ([place])   . Diastolic heart failure ([place])   . ESRD (end stage renal disease) on dialysis ([place])   . HIV (human immunodeficiency virus infection) ([place])   . Hypertension   . Neuropathy   . Polycystic kidney disease   . Renal insufficiency     Past Surgical History:  Procedure Laterality Date  . ABDOMINAL SURGERY    . ARTERIOVENOUS GRAFT PLACEMENT Right    x3 (R forearm currently used for access)  . BREAST BIOPSY Left 12/03/2015   neg  . COLON SURGERY    . ESOPHAGOGASTRODUODENOSCOPY (EGD) WITH PROPOFOL N/A 02/04/2017   Procedure: ESOPHAGOGASTRODUODENOSCOPY (EGD) WITH PROPOFOL;  Surgeon: Jonathon Bellows, MD;  Location[facility] ENDOSCOPY;  Service: Endoscopy;  Laterality: N/A;  . PERIPHERAL VASCULAR CATHETERIZATION Right 08/04/2016   Procedure: A/V Shuntogram;  Surgeon: Algernon Huxley, MD;  Location: [place] CV LAB;  Service: Cardiovascular;  Laterality: Right;  . VEIN HARVEST      Family History  Problem Relation Age of Onset  . Hypertension Brother   . Heart failure Brother   . Hypertension Mother   . Heart disease Mother     Social History  Tobacco Use  . Smoking status: Former Smoker    Packs/day: 0.25    Last attempt to quit: 08/11/2015    Years since quitting: 2.3  . Smokeless tobacco: Never Used  Substance Use Topics  . Alcohol use: No    Alcohol/week: 0.0 oz    Allergies  Allergen Reactions  . Morphine And Related Nausea And Vomiting  . Strawberry Extract   . Tramadol Nausea And Vomiting  . Venofer [Iron Sucrose]    . Buprenorphine Hcl Nausea And Vomiting  . Morphine Nausea And Vomiting    Halllucinations   Prior to Admission medications   Medication Sig Start Date End Date Taking? Authorizing Provider  acetaminophen (TYLENOL) 325 MG tablet Take 2 tablets (650 mg total) by mouth every 6 (six) hours as needed for mild pain or headache. 03/18/16  Yes Gouru, Aruna, MD  amitriptyline (ELAVIL) 10 MG tablet Take 10 mg by mouth at bedtime as needed for sleep.   Yes [provider]  amLODipine (NORVASC) 5 MG tablet Take 5 mg daily by mouth.  08/31/14  Yes [provider]  aspirin 81 MG chewable tablet Chew 1 tablet (81 mg total) by mouth daily. 01/17/16  Yes Gladstone Lighter, MD  b complex-vitamin c-folic acid (NEPHRO-VITE) 0.8 MG TABS tablet Take 1 tablet by mouth daily.    Yes [provider]  budesonide-formoterol (SYMBICORT) 160-4.5 MCG/ACT inhaler Inhale 2 puffs into the lungs 2 (two) times daily.   Yes [provider]  calcium acetate (PHOSLO) 667 MG capsule Take 2 capsules (1,334 mg total) by mouth 3 (three) times daily with meals. Patient taking differently: Take 2,001 mg by mouth 3 (three) times daily with meals.  11/10/17  Yes Wieting, Richard, MD  cloNIDine (CATAPRES) 0.1 MG tablet Take 0.1 mg by mouth daily.   Yes [provider]  epoetin alfa (EPOGEN,PROCRIT) 16109 UNIT/ML injection 10,000 Units 3 (three) times a week.   Yes [provider]  furosemide (LASIX) 80 MG tablet Take 80 mg by mouth daily.   Yes [provider]  gabapentin (NEURONTIN) 300 MG capsule Take 300 mg 2 (two) times daily by mouth.    Yes [provider]  hydrALAZINE (APRESOLINE) 100 MG tablet Take 1 tablet (100 mg total) by mouth 3 (three) times daily. Patient taking differently: Take 100 mg by mouth 3 (three) times daily. Takes 1 tablet if needed (SBP>180) 09/07/17  Yes Mody, Sital, MD  lidocaine (XYLOCAINE) 5 % ointment Apply 1 application topically daily as  needed.    Yes [provider]  losartan (COZAAR) 100 MG tablet Take 1 tablet (100 mg total) by mouth daily. 09/07/17  Yes Mody, Ulice Bold, MD  metoprolol succinate (TOPROL-XL) 50 MG 24 hr tablet Take 50 mg daily by mouth.    Yes [provider]  nitroGLYCERIN (NITROSTAT) 0.4 MG SL tablet Place 1 tablet (0.4 mg total) under the tongue every 5 (five) minutes as needed for chest pain. 01/14/17 09/17/18 Yes Gollan, Kathlene November, MD  omeprazole (PRILOSEC) 20 MG capsule Take 1 capsule by mouth daily. 08/06/17  Yes [provider]  ondansetron (ZOFRAN) 4 MG tablet Take 1 tablet (4 mg total) by mouth every 8 (eight) hours as needed for nausea or vomiting. 07/21/16  Yes Daymon Larsen, MD  oxyCODONE-acetaminophen (PERCOCET) 10-325 MG tablet Take 1 tablet by mouth daily as needed for pain.   Yes [provider]  protein supplement shake (PREMIER PROTEIN) LIQD Take 2 oz by mouth 3 (three) times a  week. Tuesday, Thursday, Saturday   Yes [provider]  SENSIPAR 30 MG tablet Take 1 tablet (30 mg total) by mouth daily. 07/07/16  Yes Fritzi Mandes, MD  VENTOLIN HFA 108 (90 Base) MCG/ACT inhaler Inhale 2 puffs into the lungs daily as needed.  05/21/16  Yes [provider]    Review of Systems  Constitutional: Positive for fatigue. Negative for appetite change.  HENT: Negative for congestion, nosebleeds, sinus pressure and sore throat.   Eyes: Positive for visual disturbance (blurry vision). Negative for pain.  Respiratory: Positive for shortness of breath. Negative for cough and chest tightness.   Cardiovascular: Positive for chest pain (sometimes at dialysis) and palpitations (when laying on the left side). Negative for leg swelling.  Gastrointestinal: Negative for abdominal distention and abdominal pain.  Endocrine: Negative.   Genitourinary: Negative.   Musculoskeletal: Negative for back pain and neck pain.  Skin: Negative.   Allergic/Immunologic: Negative.    Neurological: Positive for dizziness and numbness (tingling down left arm at times due to previous graft). Negative for light-headedness and headaches.  Hematological: Negative for adenopathy. Does not bruise/bleed easily.  Psychiatric/Behavioral: Positive for sleep disturbance (sleeping on 2 pillows). Negative for dysphoric mood. The patient is nervous/anxious (at times).    Vitals:   12/22/17 0946  BP: 122/75  Pulse: 66  Resp: 18  SpO2: 99%  Weight: 179 lb 4 oz (81.3 kg)  Height: 5\' 3"  (1.6 m)   Wt Readings from Last 3 Encounters:  12/22/17 179 lb 4 oz (81.3 kg)  12/08/17 179 lb 4 oz (81.3 kg)  11/17/17 179 lb 4 oz (81.3 kg)   Lab Results  Component Value Date   CREATININE 14.43 (H) 11/09/2017   CREATININE 12.94 (H) 11/08/2017   CREATININE 4.81 (H) 10/16/2017    Objective:   Physical Exam  Constitutional: She is oriented to person, place, and time. She appears well-developed and well-nourished.  HENT:  Head: Normocephalic and atraumatic.  Neck: Normal range of motion. Neck supple.  Cardiovascular: Normal rate and regular rhythm.  Pulmonary/Chest: Effort normal. She has no wheezes. She has no rales.  Abdominal: Soft. She exhibits no distension. There is no tenderness.  Musculoskeletal: She exhibits no edema or tenderness.  Neurological: She is alert and oriented to person, place, and time.  Skin: Skin is warm and dry.  Psychiatric: She has a normal mood and affect. Her behavior is normal. Thought content normal.  Nursing note and vitals reviewed.     Assessment & Plan:  1: Chronic heart failure with preserved ejection fraction-   - NYHA class II - euvolemic today  - weighing daily; Reminded to call for an overnight weight gain of >2 pounds or a weekly weight gain of >5 pounds - weight stable since she was last here - maintaining a 32 ounce fluid restriction per nephrology  - wearing oxygent at 2L at bedtime,during dialysis & as needed during the day - saw  cardiologist Rockey Situ) 12/08/17 - not adding salt and is trying to eat low sodium foods. Reminded about the importance of following a 2000mg  sodium diet. She says that she's being diligent about reading food labels  - BNP on 11/08/17 was 599.0 - PharmD reconciled medications with the patient  2: HTN-  - BP looks good today - BMP on 11/09/17 reviewed and showed sodium 144, potassium 5.9 and GFR 3  3.ESRD on dialysis-  - dialysis on T, TH, Sat - on the transplant list and is hoping to find a living  donor - reports having good support system at home for when she does get her transplant  4: Lymphedema- - stage 2 - has worn compression socks but says that they "cut into" her legs and made her legs hurt - does elevate her legs numerous times during the day - wearing compression boots every other day and wears them for extended hours on non-dialysis days - no edema at this time  Patient did not bring her medications nor a list. Each medication was verbally reviewed with the patient and she was encouraged to bring the bottles to every visit to confirm accuracy of list.  Return in 3 months or sooner for any questions/problems before then.

## 2017-12-20 ENCOUNTER — Other Ambulatory Visit: Payer: Self-pay | Admitting: Family Medicine

## 2017-12-20 DIAGNOSIS — Z1231 Encounter for screening mammogram for malignant neoplasm of breast: Secondary | ICD-10-CM

## 2017-12-21 ENCOUNTER — Encounter: Admit: 2017-12-21 | Discharge: 2017-12-21 | Payer: 59 | Attending: Nephrology | Primary: Nephrology

## 2017-12-22 ENCOUNTER — Encounter: Payer: Self-pay | Admitting: Family

## 2017-12-22 ENCOUNTER — Ambulatory Visit: Payer: Medicare Other | Attending: Family | Admitting: Family

## 2017-12-22 VITALS — BP 122/75 | HR 66 | Resp 18 | Ht 63.0 in | Wt 179.2 lb

## 2017-12-22 DIAGNOSIS — Z8249 Family history of ischemic heart disease and other diseases of the circulatory system: Secondary | ICD-10-CM | POA: Insufficient documentation

## 2017-12-22 DIAGNOSIS — Z7951 Long term (current) use of inhaled steroids: Secondary | ICD-10-CM | POA: Diagnosis not present

## 2017-12-22 DIAGNOSIS — E1122 Type 2 diabetes mellitus with diabetic chronic kidney disease: Secondary | ICD-10-CM | POA: Diagnosis not present

## 2017-12-22 DIAGNOSIS — Q613 Polycystic kidney, unspecified: Secondary | ICD-10-CM | POA: Diagnosis not present

## 2017-12-22 DIAGNOSIS — Z21 Asymptomatic human immunodeficiency virus [HIV] infection status: Secondary | ICD-10-CM | POA: Insufficient documentation

## 2017-12-22 DIAGNOSIS — Z87891 Personal history of nicotine dependence: Secondary | ICD-10-CM | POA: Insufficient documentation

## 2017-12-22 DIAGNOSIS — Z7982 Long term (current) use of aspirin: Secondary | ICD-10-CM | POA: Diagnosis not present

## 2017-12-22 DIAGNOSIS — I5032 Chronic diastolic (congestive) heart failure: Secondary | ICD-10-CM | POA: Insufficient documentation

## 2017-12-22 DIAGNOSIS — I509 Heart failure, unspecified: Secondary | ICD-10-CM | POA: Diagnosis present

## 2017-12-22 DIAGNOSIS — Z992 Dependence on renal dialysis: Secondary | ICD-10-CM | POA: Insufficient documentation

## 2017-12-22 DIAGNOSIS — Z7682 Awaiting organ transplant status: Secondary | ICD-10-CM | POA: Diagnosis not present

## 2017-12-22 DIAGNOSIS — I132 Hypertensive heart and chronic kidney disease with heart failure and with stage 5 chronic kidney disease, or end stage renal disease: Secondary | ICD-10-CM | POA: Insufficient documentation

## 2017-12-22 DIAGNOSIS — Z888 Allergy status to other drugs, medicaments and biological substances status: Secondary | ICD-10-CM | POA: Insufficient documentation

## 2017-12-22 DIAGNOSIS — J449 Chronic obstructive pulmonary disease, unspecified: Secondary | ICD-10-CM | POA: Insufficient documentation

## 2017-12-22 DIAGNOSIS — Z885 Allergy status to narcotic agent status: Secondary | ICD-10-CM | POA: Insufficient documentation

## 2017-12-22 DIAGNOSIS — Z79899 Other long term (current) drug therapy: Secondary | ICD-10-CM | POA: Insufficient documentation

## 2017-12-22 DIAGNOSIS — I10 Essential (primary) hypertension: Secondary | ICD-10-CM

## 2017-12-22 DIAGNOSIS — N186 End stage renal disease: Secondary | ICD-10-CM

## 2017-12-22 DIAGNOSIS — E1140 Type 2 diabetes mellitus with diabetic neuropathy, unspecified: Secondary | ICD-10-CM | POA: Insufficient documentation

## 2017-12-22 DIAGNOSIS — I890 Lymphedema, not elsewhere classified: Secondary | ICD-10-CM | POA: Insufficient documentation

## 2017-12-22 NOTE — Patient Instructions (Signed)
Continue weighing daily and call for an overnight weight gain of > 2 pounds or a weekly weight gain of >5 pounds. 

## 2017-12-24 DIAGNOSIS — Z7682 Awaiting organ transplant status: Principal | ICD-10-CM

## 2017-12-24 DIAGNOSIS — N186 End stage renal disease: Secondary | ICD-10-CM

## 2017-12-24 DIAGNOSIS — Z01818 Encounter for other preprocedural examination: Secondary | ICD-10-CM

## 2018-01-06 ENCOUNTER — Encounter: Admit: 2018-01-06 | Discharge: 2018-01-06 | Payer: 59 | Attending: Nephrology | Primary: Nephrology

## 2018-01-06 DIAGNOSIS — Z01818 Encounter for other preprocedural examination: Principal | ICD-10-CM

## 2018-01-07 ENCOUNTER — Encounter: Admit: 2018-01-07 | Discharge: 2018-01-07 | Payer: 59

## 2018-01-07 ENCOUNTER — Ambulatory Visit
Admission: RE | Admit: 2018-01-07 | Discharge: 2018-01-07 | Disposition: A | Discharge status: Home / Self Care | Payer: Medicare Other | Source: Ambulatory Visit | Attending: Family Medicine | Admitting: Family Medicine

## 2018-01-07 ENCOUNTER — Telehealth: Payer: Self-pay | Admitting: Cardiovascular Disease

## 2018-01-07 DIAGNOSIS — Z1231 Encounter for screening mammogram for malignant neoplasm of breast: Secondary | ICD-10-CM | POA: Insufficient documentation

## 2018-01-07 DIAGNOSIS — N186 End stage renal disease: Principal | ICD-10-CM

## 2018-01-07 NOTE — Telephone Encounter (Signed)
See results note. Reviewed results and recommendations with patient.

## 2018-01-07 NOTE — Telephone Encounter (Signed)
Pt returning out call for monitor results  Please call back

## 2018-01-07 NOTE — Telephone Encounter (Signed)
Pt is returning your call

## 2018-01-07 NOTE — Telephone Encounter (Signed)
Left voicemail message to call back  

## 2018-01-08 ENCOUNTER — Encounter
Admit: 2018-01-08 | Discharge: 2018-01-12 | Disposition: A | Discharge status: Home Health | Payer: 59 | Attending: Student in an Organized Health Care Education/Training Program

## 2018-01-08 ENCOUNTER — Ambulatory Visit: Admit: 2018-01-08 | Discharge: 2018-01-12 | Disposition: A | Discharge status: Home Health | Payer: 59

## 2018-01-08 DIAGNOSIS — Z940 Kidney transplant status: Secondary | ICD-10-CM

## 2018-01-08 DIAGNOSIS — N186 End stage renal disease: Principal | ICD-10-CM

## 2018-01-08 DIAGNOSIS — Z7682 Awaiting organ transplant status: Principal | ICD-10-CM

## 2018-01-08 HISTORY — DX: Kidney transplant status: Z94.0

## 2018-01-08 HISTORY — PX: KIDNEY TRANSPLANT: SHX239

## 2018-01-10 ENCOUNTER — Ambulatory Visit: Payer: Medicare Other | Admitting: Podiatry

## 2018-01-10 MED ORDER — MYCOPHENOLATE SODIUM 180 MG TABLET,DELAYED RELEASE
ORAL_TABLET | 10 refills | 0.00000 days | Status: CP
Start: 2018-01-10 — End: 2018-01-10

## 2018-01-10 MED ORDER — MYFORTIC 180 MG TABLET,DELAYED RELEASE
ORAL_TABLET | Freq: Two times a day (BID) | ORAL | 11 refills | 0 days | Status: CP
Start: 2018-01-10 — End: 2018-01-10

## 2018-01-10 MED ORDER — PROGRAF 1 MG CAPSULE
ORAL_CAPSULE | Freq: Two times a day (BID) | ORAL | 11 refills | 0 days | Status: CP
Start: 2018-01-10 — End: 2018-01-11

## 2018-01-10 MED ORDER — MYCOPHENOLATE SODIUM 180 MG TABLET,DELAYED RELEASE: tablet | 10 refills | 0 days | Status: AC

## 2018-01-10 MED ORDER — VALGANCICLOVIR 450 MG TABLET
ORAL_TABLET | Freq: Every day | ORAL | 2 refills | 0.00000 days | Status: CP
Start: 2018-01-10 — End: 2018-01-12

## 2018-01-10 MED FILL — MYFORTIC/180MG/TAB: MYFORTIC/180MG/TAB | 30 days supply | Qty: 180 | Fill #0

## 2018-01-11 MED ORDER — CIPROFLOXACIN 500 MG TABLET
ORAL_TABLET | ORAL | 0 refills | 0 days
Start: 2018-01-11 — End: 2018-01-11

## 2018-01-11 MED ORDER — ACETAMINOPHEN 500 MG TABLET: each | 0 refills | 0 days

## 2018-01-11 MED ORDER — TRAMADOL 50 MG TABLET
Freq: Three times a day (TID) | ORAL | 0 refills | 0.00000 days | Status: CP | PRN
Start: 2018-01-11 — End: 2018-01-11

## 2018-01-11 MED ORDER — ACETAMINOPHEN 500 MG TABLET
ORAL_TABLET | Freq: Three times a day (TID) | ORAL | 0 refills | 0.00000 days | Status: CP | PRN
Start: 2018-01-11 — End: 2018-01-20

## 2018-01-11 MED ORDER — MAGNESIUM OXIDE-MAGNESIUM AMINO ACID CHELATE 133 MG TABLET: 1 | tablet | Freq: Two times a day (BID) | 11 refills | 0 days | Status: AC

## 2018-01-11 MED ORDER — POLYETHYLENE GLYCOL 3350 17 GRAM ORAL POWDER PACKET
Freq: Every day | ORAL | 0 refills | 0.00000 days | Status: CP | PRN
Start: 2018-01-11 — End: 2018-06-21

## 2018-01-11 MED ORDER — DOCUSATE SODIUM 100 MG CAPSULE
ORAL_CAPSULE | Freq: Two times a day (BID) | ORAL | 0 refills | 0.00000 days | Status: CP | PRN
Start: 2018-01-11 — End: 2018-01-11
  Filled 2018-03-24: qty 60, 30d supply, fill #0

## 2018-01-11 MED ORDER — POLYETHYLENE GLYCOL 3350 17 GRAM ORAL POWDER PACKET: each | 0 refills | 0 days

## 2018-01-11 MED ORDER — AMLODIPINE 10 MG TABLET: tablet | 10 refills | 0 days | Status: AC

## 2018-01-11 MED ORDER — MAGNESIUM OXIDE-MAGNESIUM AMINO ACID CHELATE 133 MG TABLET
ORAL_TABLET | Freq: Two times a day (BID) | ORAL | 11 refills | 0.00000 days | Status: CP
Start: 2018-01-11 — End: 2018-02-03

## 2018-01-11 MED ORDER — DOCUSATE SODIUM 100 MG CAPSULE: 100 mg | capsule | 0 refills | 0 days

## 2018-01-11 MED ORDER — TRAMADOL 50 MG TABLET: 50 mg | each | 0 refills | 0 days

## 2018-01-11 MED ORDER — PROGRAF 1 MG CAPSULE: capsule | 11 refills | 0 days

## 2018-01-11 MED ORDER — PROGRAF 1 MG CAPSULE
ORAL_CAPSULE | Freq: Two times a day (BID) | ORAL | 11 refills | 0.00000 days | Status: CP
Start: 2018-01-11 — End: 2018-01-12

## 2018-01-11 MED ORDER — METOPROLOL TARTRATE 25 MG TABLET
ORAL_TABLET | Freq: Two times a day (BID) | ORAL | 11 refills | 0.00000 days | Status: CP
Start: 2018-01-11 — End: 2019-01-11
  Filled 2018-03-24: qty 180, 30d supply, fill #0

## 2018-01-11 MED ORDER — SULFAMETHOXAZOLE 400 MG-TRIMETHOPRIM 80 MG TABLET
ORAL_TABLET | ORAL | 4 refills | 0 days
Start: 2018-01-11 — End: 2018-01-11

## 2018-01-11 MED ORDER — TRAMADOL 50 MG TABLET: 50 mg | tablet | Freq: Three times a day (TID) | 0 refills | 0 days | Status: AC

## 2018-01-11 MED ORDER — OXYCODONE-ACETAMINOPHEN 10 MG-325 MG TABLET
ORAL_TABLET | 0 refills | 0 days
Start: 2018-01-11 — End: 2018-02-03

## 2018-01-11 MED ORDER — AMLODIPINE 10 MG TABLET
ORAL_TABLET | Freq: Every day | ORAL | 11 refills | 0.00000 days | Status: CP
Start: 2018-01-11 — End: 2018-01-11
  Filled 2018-03-24: qty 30, 30d supply, fill #0

## 2018-01-11 MED ORDER — HYDRALAZINE 25 MG TABLET
ORAL_TABLET | Freq: Three times a day (TID) | ORAL | 0 refills | 0.00000 days | Status: CP
Start: 2018-01-11 — End: 2018-01-11

## 2018-01-11 MED ORDER — HYDRALAZINE 25 MG TABLET: 50 mg | tablet | Freq: Three times a day (TID) | 0 refills | 0 days | Status: AC

## 2018-01-11 MED FILL — SULFAMETHOXAZOLE/TRIMETHO/400-80MG/TABS: SULFAMETHOXAZOLE/TRIMETHO/400-80MG/TABS | 28 days supply | Qty: 12 | Fill #0

## 2018-01-11 MED FILL — CIPROFLOXACIN/500MG/TABS: CIPROFLOXACIN/500MG/TABS | 4 days supply | Qty: 8 | Fill #0

## 2018-01-11 MED FILL — MG PLUS PROTEIN 133 MG/133 MG/TABS: MG PLUS PROTEIN 133 MG/133 MG/TABS | 50 days supply | Qty: 100 | Fill #0

## 2018-01-11 MED FILL — AMLODIPINE BESYLATE/10MG/TABS: AMLODIPINE BESYLATE/10MG/TABS | 30 days supply | Qty: 30 | Fill #0

## 2018-01-11 MED FILL — POLYETHYLENE GLYCOL 3350/3350 NF/PACK: POLYETHYLENE GLYCOL 3350/3350 NF/PACK | 30 days supply | Qty: 30 | Fill #0

## 2018-01-11 MED FILL — DOCUSATE/100MG/CAPS: DOCUSATE/100MG/CAPS | 30 days supply | Qty: 60 | Fill #0

## 2018-01-11 MED FILL — METOPROLOL TARTRATE/25MG/TABS: METOPROLOL TARTRATE/25MG/TABS | 30 days supply | Qty: 180 | Fill #0

## 2018-01-11 MED FILL — HYDRALAZINE/25MG/TAB: HYDRALAZINE/25MG/TAB | 30 days supply | Qty: 180 | Fill #0

## 2018-01-11 MED FILL — ACETAMINOPHEN/500MG/TABS: ACETAMINOPHEN/500MG/TABS | 16 days supply | Qty: 100 | Fill #0

## 2018-01-12 DIAGNOSIS — Z79899 Other long term (current) drug therapy: Secondary | ICD-10-CM

## 2018-01-12 DIAGNOSIS — M170 Bilateral primary osteoarthritis of knee: Secondary | ICD-10-CM

## 2018-01-12 DIAGNOSIS — M160 Bilateral primary osteoarthritis of hip: Secondary | ICD-10-CM

## 2018-01-12 DIAGNOSIS — B9689 Other specified bacterial agents as the cause of diseases classified elsewhere: Secondary | ICD-10-CM

## 2018-01-12 DIAGNOSIS — R7881 Bacteremia: Secondary | ICD-10-CM

## 2018-01-12 DIAGNOSIS — B9561 Methicillin susceptible Staphylococcus aureus infection as the cause of diseases classified elsewhere: Secondary | ICD-10-CM

## 2018-01-12 DIAGNOSIS — G894 Chronic pain syndrome: Secondary | ICD-10-CM

## 2018-01-12 DIAGNOSIS — N186 End stage renal disease: Secondary | ICD-10-CM

## 2018-01-12 DIAGNOSIS — T8613 Kidney transplant infection: Principal | ICD-10-CM

## 2018-01-12 DIAGNOSIS — I5032 Chronic diastolic (congestive) heart failure: Secondary | ICD-10-CM

## 2018-01-12 DIAGNOSIS — Z5181 Encounter for therapeutic drug level monitoring: Secondary | ICD-10-CM

## 2018-01-12 DIAGNOSIS — I132 Hypertensive heart and chronic kidney disease with heart failure and with stage 5 chronic kidney disease, or end stage renal disease: Secondary | ICD-10-CM

## 2018-01-12 DIAGNOSIS — J449 Chronic obstructive pulmonary disease, unspecified: Secondary | ICD-10-CM

## 2018-01-12 DIAGNOSIS — Z87891 Personal history of nicotine dependence: Secondary | ICD-10-CM

## 2018-01-12 DIAGNOSIS — M792 Neuralgia and neuritis, unspecified: Secondary | ICD-10-CM

## 2018-01-12 MED ORDER — SULFAMETHOXAZOLE 400 MG-TRIMETHOPRIM 80 MG TABLET
ORAL_TABLET | ORAL | 5 refills | 0 days | Status: CP
Start: 2018-01-12 — End: 2018-01-28

## 2018-01-12 MED ORDER — VALGANCICLOVIR 450 MG TABLET: tablet | 5 refills | 0 days

## 2018-01-12 MED ORDER — VALGANCICLOVIR 450 MG TABLET
ORAL_TABLET | ORAL | 2 refills | 0.00000 days | Status: CP
Start: 2018-01-12 — End: 2018-01-28

## 2018-01-12 MED ORDER — PROGRAF 1 MG CAPSULE
ORAL_CAPSULE | Freq: Two times a day (BID) | ORAL | 11 refills | 0.00000 days | Status: CP
Start: 2018-01-12 — End: 2018-01-20

## 2018-01-12 MED ORDER — CALCIUM CARBONATE 200 MG CALCIUM (500 MG) CHEWABLE TABLET
Freq: Three times a day (TID) | ORAL | 0 refills | 0 days | PRN
Start: 2018-01-12 — End: 2018-01-20

## 2018-01-12 MED ORDER — PROGRAF 1 MG CAPSULE: capsule | 11 refills | 0 days

## 2018-01-12 MED ORDER — CIPROFLOXACIN 500 MG TABLET
ORAL_TABLET | Freq: Two times a day (BID) | ORAL | 0 refills | 0 days | Status: CP
Start: 2018-01-12 — End: 2018-01-20

## 2018-01-12 MED FILL — PROGRAF/1MG/CAP: PROGRAF/1MG/CAP | 30 days supply | Qty: 480 | Fill #0

## 2018-01-12 MED FILL — VALGANCICLOVIR/450MG/TABS: VALGANCICLOVIR/450MG/TABS | 30 days supply | Qty: 15 | Fill #0

## 2018-01-12 MED FILL — TRAMADOL HCL/50MG/TABS: TRAMADOL HCL/50MG/TABS | 10 days supply | Qty: 30 | Fill #0

## 2018-01-12 NOTE — Unmapped (Signed)
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 Vital signs and laboratory values were stable and within normal limits. Surgical wounds were inspected and found to be consistent with the performed procedure. Discharge plan was discussed, instructions were given, and all questions answered.    Subjective  No acute events overnight. Pain Controlled. Has been walking around a lot. + flatus.    Objective   BP 168/93  - Pulse 74  - Temp 36.6 ??C (97.9 ??F) (Oral)  - Resp 20  - Ht 160 cm (5' 2.99)  - Wt 81.6 kg (179 lb 14.3 oz)  - SpO2 99%  - BMI 31.88 kg/m??       Intake/Output Summary (Last 24 hours) at 01/12/2018 0830  Last data filed at 01/12/2018 0800  Gross per 24 hour   Intake 718 ml   Output 3252 ml   Net -2534 ml     Physical Exam  General: Resting comfortably in bed, NAD  CV: Good peripheral pulses, no BLE edema  Resp: NWOB on RA  GI: Soft abdomen, appropriate tenderness, nondistended, no rebound or guarding. Surgical incision in RLQ with staples- clean, dry and intact. Subcutaneous drain in place with SS output. Perinephric drain removed, site covered with clean gauze.  MSK: warm, well perfused  Neuro: Sensation and motor intact.     Admission HPI:  Danielle Parker is a 57 y.o. female with a past medical history of ESRD who presents as a candidate for kidney transplant. She denies any changes in health since her last clinic appointment.  ??  Dialysis schedule: TTS   Dialysis access: Right upper extremity fistula   Baseline urine output: every other day, takes lasix   Previous femoral catheter: yes     Hospital Course:  Danielle Parker is a 57 y.o. female with a past medical history of ESRD (HD TTS) who underwent renal transplantation on 01/08/2018. She tolerated the procedure well and was extubated in the OR. She was brought to the PACU for routine post-operative care and to a stepdown nursing floor thereafter. Postoperative course was complicated by hypertension, however resolved once all home antihypertensives were resumed and adjusted per Nephrology. She received aggressive pulmonary toliet postop and was down to 2L [place] (her baseline) by POD 2. A foley was placed peri-operatively and removed on POD 4. She passed trial of void without issue. Perinephric drain was removed on POD 4. Subcutaneous drain remained in place as output was >30 cc/24 hours.    #Donor bacteremia  On 01/09/18, donor cultures resulted positive for MSSA and enterobacter in lungs. She was initiated on a 7 day course of PO ciprofloxacin, renally dosed, to end on 01/16/18. Patient was stable throughout her hospital stay, showed no signs of infection or bacteremia.     #Immunosuppresion  After receiving her donor kidney, patient underwent Campath and cisatracurium induction therapy. She was then started on methylprednisolone taper which ended on 01/11/18, Cellcept 750mg  BID, and Prograf titrated up to 8mg  BID by day of discharge.    On day of discharge patient had pain controlled on oral medications, was voiding spontaneously and ambulating well without assistance. For these reasons, she was deemed appropriate for discharge on POD 4. She will be seen in clinic for postop follow-up.    Condition at Discharge: Improved  Discharge Medications:      Your Medication List      STOP taking these medications    b complex-vitamin c-folic acid 0.8 mg Tab  Generic drug:  B complex-vitamin C-folic acid     calcium acetate 667 mg capsule  Commonly known as:  PHOSLO     furosemide 80 MG tablet  Commonly known as:  LASIX     lidocaine 5 % ointment  Commonly known as:  XYLOCAINE     losartan 25 MG tablet  Commonly known as:  COZAAR     magnesium oxide 400 mg (241.3 mg magnesium) tablet  Commonly known as:  MAG-OX     metoprolol succinate 50 MG 24 hr tablet  Commonly known as:  TOPROL-XL     multivitamin capsule     SENSIPAR 30 MG tablet  Generic drug:  cinacalcet        START taking these medications    acetaminophen 500 MG tablet  Commonly known as:  TYLENOL  Take 1-2 tablets (500-1,000 mg total) by mouth Three (3) times a day as needed.     ciprofloxacin HCl 500 MG tablet  Commonly known as:  CIPRO  Take 1 tablet (500 mg total) by mouth every twelve (12) hours. for 4 days     docusate sodium 100 MG capsule  Commonly known as:  COLACE  Take 1 capsule (100 mg total) by mouth two (2) times a day as needed for constipation.     magnesium oxide-Mg AA chelate 133 mg Tab  Commonly known as:  magnesium (amino acid chelate)  Take 1 tablet by mouth Two (2) times a day. HOLD until directed to start by your coordinator.     metoprolol tartrate 25 MG tablet  Commonly known as:  LOPRESSOR  Take 3 tablets (75 mg total) by mouth Two (2) times a day.     MYFORTIC 180 MG EC tablet  Generic drug:  mycophenolate  Take 3 tablets (540 mg total) by mouth Two (2) times a day.     polyethylene glycol 17 gram packet  Commonly known as:  MIRALAX  Take 17 g by mouth daily as needed.     PROGRAF 1 MG capsule  Generic drug:  tacrolimus  Take 7 capsules (7 mg total) by mouth two (2) times a day.     sulfamethoxazole-trimethoprim 400-80 mg per tablet  Commonly known as:  BACTRIM,SEPTRA  Take 1 tablet (80 mg of trimethoprim total) by mouth 3 (three) times a week.     traMADol 50 mg tablet  Commonly known as:  ULTRAM  Take 1 tablet (50 mg total) by mouth every eight (8) hours as needed for other (pain). for up to 5 days     valGANciclovir 450 mg tablet  Commonly known as:  VALCYTE  Take 1 tablet (450 mg total) by mouth daily.        CHANGE how you take these medications    amLODIPine 10 MG tablet  Commonly known as:  NORVASC  Take 1 tablet (10 mg total) by mouth daily. Use only as directed.  What changed:    ?? medication strength  ?? how much to take  ?? when to take this     hydrALAZINE 25 MG tablet  Commonly known as:  APRESOLINE  Take 2 tablets (50 mg total) by mouth Three (3) times a day.  What changed:  medication strength     oxyCODONE-acetaminophen 10-325 mg per tablet  Commonly known as:  PERCOCET  HOLD. Do not take while on Tramadol.  What changed:    ?? how much to take  ?? how to take this  ?? when to take this  ?? reasons to take this  ?? additional instructions        CONTINUE taking these medications    albuterol 90 mcg/actuation inhaler  Commonly known as:  PROVENTIL HFA;VENTOLIN HFA  Inhale 2 puffs daily as needed for shortness of breath.     aspirin 81 MG tablet  Commonly known as:  ECOTRIN  Take 81 mg by mouth daily.     cloNIDine HCl 0.1 MG tablet  Commonly known as:  CATAPRES  Take 0.1 mg by mouth Two (2) times a day.     gabapentin 300 MG capsule  Commonly known as:  NEURONTIN  Take 300 mg by mouth nightly.     leg brace Misc  Commonly known as:  KNEE SUPPORT BRACE  1 brace for left knee     omeprazole 40 MG capsule  Commonly known as:  PriLOSEC  Take 40 mg by mouth daily.        ASK your doctor about these medications    rosuvastatin 5 MG tablet  Commonly known as:  CRESTOR  Take 5 mg by mouth daily.            Pending Test Results:    Order Current Status    Surgical pathology exam In process    Tacrolimus Level, Trough In process        Hospital Radiology:  US Renal Transplant W Doppler    Result Date: 01/08/2018  EXAM: US RENAL TRANSPLANT W DOPPLER DATE: 01/08/2018 11:16 AM ACCESSION: [id number] UN DICTATED: 01/08/2018 11:34 AM INTERPRETATION LOCATION: [facility]     CLINICAL INDICATION: 57 years old Female with check vascular patency-     COMPARISON: Renal ultrasound 08/14/2015.     TECHNIQUE:  Ultrasound views of the renal transplant were obtained using gray scale and color and spectral Doppler imaging. Views of the urinary bladder were obtained using gray scale imaging.     FINDINGS:     TRANSPLANTED KIDNEY: The renal transplant was located in the right lower quadrant. Normal size and echogenicity.  No solid masses. No perinephric collections identified. No hydronephrosis. There are punctate echogenic foci in the inferior pole which may represent vessels versus tiny calcifications.     VESSELS: - Perfusion: Using power Doppler, normal perfusion was seen throughout the renal parenchyma. - Resistive indices in the renal transplant are within normal limits. - Main renal artery/iliac artery: Patent - Main renal vein/iliac vein: Patent     BLADDER: Foley catheter noted.         Right lower quadrant transplant kidney with patent vasculature. Resistive indices within normal limits.     Please see below for data measurements:     RLQ kidney: length 10.6 cm; width 6.5 cm; height 4.9 cm     Arcuate artery superior resistive indices: 0.65  Arcuate artery mid resistive indices: 0.75 Arcuate artery inferior resistive indices: 0.72     Segmental artery superior resistive indices: 0.77 Segmental artery mid resistive indices: 0.78 Segmental artery inferior resistive indices: 0.78     Main renal artery resistive indices: 0.77-0.84 Main renal vein: Patent     Iliac artery: Patent Iliac vein: Patent     Bladder volume: 12 ml             Xr Chest Pa And Lateral    Result Date: 01/08/2018  EXAM: XR CHEST 2 VIEWS DATE: 01/07/2018 10:20 PM ACCESSION: [id number] UN DICTATED: 01/08/2018 10:10 AM INTERPRETATION LOCATION: [facility]     CLINICAL INDICATION: 57 years old Female with pre-op-      COMPARISON:  02/17/2017       TECHNIQUE:   PA and lateral views of the chest      FINDINGS: Mild congestion and edema have resolved.  The transverse heart size is mildly enlarged but stable.  Multiple surgical clips overlie the left upper chest wall.  There is no new consolidation or pneumothorax.         Resolved edema and congestion.  No new airspace disease.      Most Recent Labs:  Lab Results   Component Value Date    WBC 7.7 01/12/2018    HGB 10.8 (L) 01/12/2018    HCT 36.2 01/12/2018    PLT 103 (L) 01/12/2018       Lab Results   Component Value Date    NA 139 01/12/2018    K 4.3 01/12/2018    CL 110 (H) 01/12/2018    CO2 24.0 01/12/2018    BUN 36 (H) 01/12/2018    CREATININE 1.65 (H) 01/12/2018    CALCIUM 8.7 01/12/2018    MG 2.0 01/12/2018    PHOS 2.5 (L) 01/12/2018       Lab Results   Component Value Date    ALKPHOS 86 01/07/2018    BILITOT 0.5 01/07/2018    BILIDIR 0.60 (H) 08/14/2015    PROT 8.2 01/07/2018    ALBUMIN 4.2 01/07/2018    ALT <8 (L) 01/07/2018    AST 19 01/07/2018    GGT 18 01/07/2018       Lab Results   Component Value Date    PT 12.2 01/07/2018    INR 1.07 01/07/2018    APTT 26.0 (L) 01/07/2018       Lab Results   Component Value Date    AMYLASE 75 01/07/2018    LIPASE 115 10/26/2016         Micro:  Microbiology Results (last day)     Procedure Component Value Date/Time Date/Time    Urine Culture [[id number]]     Lab Status:  No result Specimen:  Urine from Clean Catch           Final Pathology:  Final Diagnosis   Date Value Ref Range Status   12/18/2015   Final    A: Sigmoid, biopsy   - Polypoid fragment of benign colonic mucosa with focal mild reactive epithelial hyperplasia (1 fragment) (see comment)  - Unremarkable colonic mucosa (2 fragments)           Discharge Instructions:        Follow Up instructions and Outpatient Referrals     Ambulatory referral to Surgical Oncology      Patient with mammogram 5/31 with right breast calcifications, s/p kidney transplant 01/08/2018         Ambulatory referral  to Home Health      Disciplines requested:  Nursing    Nursing requested: Teaching/skilled observation and assessment  Other: (please enter in comments)       What teaching is needed (new diagnosis? new medications?):  new renal transplant, new medications, JP drain    Physician to follow patient's care:  Referring Provider    Requested start of care date:  Routine (within 48 hours)    HH RN: medication management; teaching/education; vitals (BP, temp, weight) 3x week    Please draw following labs on MWF, before 9AM: Na, K, Cl, CO2, BUN, Cr, Gluc, CA, Albumin, PO4, CBC/diff, Mg, and tacrolimus (Prograf) trough     All communication with MD and lab results can be sent to attention of: Sinda Du  [phone]; [phone]        I certify that Danielle Parker is confined to his/her home and needs intermittent skilled nursing care, physical therapy and/or speech therapy or continues to need occupational therapy. The patient is under my care, and I have authorized services on this plan of care and will periodically review the plan. The patient had a face-to-face encounter with an allowed provider type on (date) 01/11/2018 and the encounter was related to the primary reason for home health care.         Discharge instructions      Activity: Do not lift > 10-15 lbs for 1st 6 weeks, then gradually increase to 25 lbs over the following 6 weeks. Resume heavy lifting only after being cleared to do so at follow-up appointment in Transplant Surgery clinic.    Diet: Regular    Other Instructions: Continue to monitor and record JP drain output daily.    Your Post-Transplant Coordinator is Sinda Du. Contact your transplant coordinator or the Transplant Surgery Office [phone]) during business hours or page the transplant coordinator on call [phone]) after business hours for:    - fever >100.5 degrees F by mouth, any fever with shaking chills, or other signs or symptoms of infection   - uncontrolled nausea, vomiting, or diarrhea; inability to have a bowel movement for > 3 days. - any problem that prevents taking medications as scheduled.   - pain uncontrolled with prescribed medication or new pain or tenderness at the surgical site   - sudden weight gain or increase in blood pressure (greater than 140/85)   - shortness of breath, chest pain / discomfort   - new or increasing jaundice   - urinary symptoms including pain / difficulty / burning or tea-colored urine   - any other new or concerning symptoms   - questions regarding your medications or continuing care      Patient may shower, but should not immerse wounds in bath or pool for 2-3 weeks. Wash the surgical site with mild soap and water, but do not scrub vigorously.    You may dress wounds with dry gauze and paper tape to avoid soilage.    Do not drive or operate heavy machinery prior to MD clearance, or at any time while taking narcotics.    Inspect surgical sites at least twice daily, contact Transplant Coordinator for spreading redness, purulent discharge, or increasing bleeding or drainage, or for separation of wounds.     Maintain a written record of daily vital signs, per Handbook instructions.     Maintain a written record of medications taken and review against the discharge medications sheet (orange paper). Periodically review your Transplant Handbook for important information  regarding postoperative care and required precautions.      Labs and Other Follow-ups after Discharge:   Kidney  Labs 3x week: Ca, Mag, Phos, Na, K, CI, CO2, BUN, Creatinine, Glucose, CBC, and Tacrolimus trough  Labs every 3 months: AST, ALT, GGT, AP & T. Bilirubin    Transplant coordinator:  Sinda Du [phone] fax [phone]               Future Appointments:  Appointments which have been scheduled for you    Feb 09, 2018  2:45 PM EDT  (Arrive by 2:15 PM)  [facility] LOCAL with Caryl Ada, MD  [facility] UROLOGY PROCEDURES [facility]) [address]  [place] [address]  [phone]       Additional instructions:      The below services have been ordered for you by your medical team to help your health and safety at home.    Home Health Agency: [facility]  Phone:  [phone]  Start of Care:  Agency will call  Services: [facility] RN  Special Instructions:  n/a      Please contact your Post-Transplant Coordinator if you have any problems/concerns:  Sinda Du  [phone]; [phone]

## 2018-01-13 ENCOUNTER — Encounter: Admit: 2018-01-13 | Discharge: 2018-02-22 | Payer: 59

## 2018-01-13 ENCOUNTER — Inpatient Hospital Stay: Admit: 2018-01-13 | Discharge: 2018-02-22 | Payer: 59

## 2018-01-13 ENCOUNTER — Encounter: Admit: 2018-01-13 | Discharge: 2018-02-22 | Payer: PRIVATE HEALTH INSURANCE

## 2018-01-13 DIAGNOSIS — B9561 Methicillin susceptible Staphylococcus aureus infection as the cause of diseases classified elsewhere: Secondary | ICD-10-CM

## 2018-01-13 DIAGNOSIS — I132 Hypertensive heart and chronic kidney disease with heart failure and with stage 5 chronic kidney disease, or end stage renal disease: Secondary | ICD-10-CM

## 2018-01-13 DIAGNOSIS — G894 Chronic pain syndrome: Secondary | ICD-10-CM

## 2018-01-13 DIAGNOSIS — Z5181 Encounter for therapeutic drug level monitoring: Secondary | ICD-10-CM

## 2018-01-13 DIAGNOSIS — M160 Bilateral primary osteoarthritis of hip: Secondary | ICD-10-CM

## 2018-01-13 DIAGNOSIS — J449 Chronic obstructive pulmonary disease, unspecified: Secondary | ICD-10-CM

## 2018-01-13 DIAGNOSIS — Z87891 Personal history of nicotine dependence: Secondary | ICD-10-CM

## 2018-01-13 DIAGNOSIS — B9689 Other specified bacterial agents as the cause of diseases classified elsewhere: Secondary | ICD-10-CM

## 2018-01-13 DIAGNOSIS — M170 Bilateral primary osteoarthritis of knee: Secondary | ICD-10-CM

## 2018-01-13 DIAGNOSIS — Z79899 Other long term (current) drug therapy: Secondary | ICD-10-CM

## 2018-01-13 DIAGNOSIS — N186 End stage renal disease: Secondary | ICD-10-CM

## 2018-01-13 DIAGNOSIS — R7881 Bacteremia: Secondary | ICD-10-CM

## 2018-01-13 DIAGNOSIS — T8613 Kidney transplant infection: Principal | ICD-10-CM

## 2018-01-13 DIAGNOSIS — I5032 Chronic diastolic (congestive) heart failure: Secondary | ICD-10-CM

## 2018-01-13 DIAGNOSIS — M792 Neuralgia and neuritis, unspecified: Secondary | ICD-10-CM

## 2018-01-14 DIAGNOSIS — Z79899 Other long term (current) drug therapy: Secondary | ICD-10-CM

## 2018-01-14 DIAGNOSIS — B9689 Other specified bacterial agents as the cause of diseases classified elsewhere: Secondary | ICD-10-CM

## 2018-01-14 DIAGNOSIS — M160 Bilateral primary osteoarthritis of hip: Secondary | ICD-10-CM

## 2018-01-14 DIAGNOSIS — R7881 Bacteremia: Secondary | ICD-10-CM

## 2018-01-14 DIAGNOSIS — N186 End stage renal disease: Secondary | ICD-10-CM

## 2018-01-14 DIAGNOSIS — I5032 Chronic diastolic (congestive) heart failure: Secondary | ICD-10-CM

## 2018-01-14 DIAGNOSIS — M170 Bilateral primary osteoarthritis of knee: Secondary | ICD-10-CM

## 2018-01-14 DIAGNOSIS — J449 Chronic obstructive pulmonary disease, unspecified: Secondary | ICD-10-CM

## 2018-01-14 DIAGNOSIS — Z5181 Encounter for therapeutic drug level monitoring: Secondary | ICD-10-CM

## 2018-01-14 DIAGNOSIS — G894 Chronic pain syndrome: Secondary | ICD-10-CM

## 2018-01-14 DIAGNOSIS — B9561 Methicillin susceptible Staphylococcus aureus infection as the cause of diseases classified elsewhere: Secondary | ICD-10-CM

## 2018-01-14 DIAGNOSIS — T8613 Kidney transplant infection: Principal | ICD-10-CM

## 2018-01-14 DIAGNOSIS — I132 Hypertensive heart and chronic kidney disease with heart failure and with stage 5 chronic kidney disease, or end stage renal disease: Secondary | ICD-10-CM

## 2018-01-14 DIAGNOSIS — M792 Neuralgia and neuritis, unspecified: Secondary | ICD-10-CM

## 2018-01-14 DIAGNOSIS — Z87891 Personal history of nicotine dependence: Secondary | ICD-10-CM

## 2018-01-15 ENCOUNTER — Encounter: Admit: 2018-01-15 | Discharge: 2018-01-15 | Payer: 59

## 2018-01-15 DIAGNOSIS — Z87891 Personal history of nicotine dependence: Secondary | ICD-10-CM

## 2018-01-15 DIAGNOSIS — M170 Bilateral primary osteoarthritis of knee: Secondary | ICD-10-CM

## 2018-01-15 DIAGNOSIS — B9561 Methicillin susceptible Staphylococcus aureus infection as the cause of diseases classified elsewhere: Secondary | ICD-10-CM

## 2018-01-15 DIAGNOSIS — Z940 Kidney transplant status: Principal | ICD-10-CM

## 2018-01-15 DIAGNOSIS — M792 Neuralgia and neuritis, unspecified: Secondary | ICD-10-CM

## 2018-01-15 DIAGNOSIS — Z5181 Encounter for therapeutic drug level monitoring: Secondary | ICD-10-CM

## 2018-01-15 DIAGNOSIS — J449 Chronic obstructive pulmonary disease, unspecified: Secondary | ICD-10-CM

## 2018-01-15 DIAGNOSIS — B9689 Other specified bacterial agents as the cause of diseases classified elsewhere: Secondary | ICD-10-CM

## 2018-01-15 DIAGNOSIS — Z79899 Other long term (current) drug therapy: Secondary | ICD-10-CM

## 2018-01-15 DIAGNOSIS — I5032 Chronic diastolic (congestive) heart failure: Secondary | ICD-10-CM

## 2018-01-15 DIAGNOSIS — M160 Bilateral primary osteoarthritis of hip: Secondary | ICD-10-CM

## 2018-01-15 DIAGNOSIS — G894 Chronic pain syndrome: Secondary | ICD-10-CM

## 2018-01-15 DIAGNOSIS — N186 End stage renal disease: Secondary | ICD-10-CM

## 2018-01-15 DIAGNOSIS — I132 Hypertensive heart and chronic kidney disease with heart failure and with stage 5 chronic kidney disease, or end stage renal disease: Secondary | ICD-10-CM

## 2018-01-15 DIAGNOSIS — T8613 Kidney transplant infection: Principal | ICD-10-CM

## 2018-01-15 DIAGNOSIS — R7881 Bacteremia: Secondary | ICD-10-CM

## 2018-01-17 ENCOUNTER — Emergency Department
Admission: EM | Admit: 2018-01-17 | Discharge: 2018-01-17 | Disposition: A | Discharge status: Home / Self Care | Payer: Medicare Other | Attending: Emergency Medicine | Admitting: Emergency Medicine

## 2018-01-17 ENCOUNTER — Emergency Department (HOSPITAL_COMMUNITY): Payer: Medicare Other

## 2018-01-17 ENCOUNTER — Emergency Department: Payer: Medicare Other

## 2018-01-17 ENCOUNTER — Other Ambulatory Visit: Payer: Self-pay

## 2018-01-17 DIAGNOSIS — R52 Pain, unspecified: Secondary | ICD-10-CM

## 2018-01-17 DIAGNOSIS — Z79899 Other long term (current) drug therapy: Secondary | ICD-10-CM | POA: Insufficient documentation

## 2018-01-17 DIAGNOSIS — I110 Hypertensive heart disease with heart failure: Secondary | ICD-10-CM | POA: Diagnosis not present

## 2018-01-17 DIAGNOSIS — E119 Type 2 diabetes mellitus without complications: Secondary | ICD-10-CM | POA: Diagnosis not present

## 2018-01-17 DIAGNOSIS — Z940 Kidney transplant status: Secondary | ICD-10-CM | POA: Diagnosis not present

## 2018-01-17 DIAGNOSIS — I5032 Chronic diastolic (congestive) heart failure: Secondary | ICD-10-CM | POA: Diagnosis not present

## 2018-01-17 DIAGNOSIS — Z87891 Personal history of nicotine dependence: Secondary | ICD-10-CM | POA: Insufficient documentation

## 2018-01-17 DIAGNOSIS — R1030 Lower abdominal pain, unspecified: Secondary | ICD-10-CM | POA: Diagnosis present

## 2018-01-17 DIAGNOSIS — G8918 Other acute postprocedural pain: Secondary | ICD-10-CM | POA: Insufficient documentation

## 2018-01-17 DIAGNOSIS — Z7982 Long term (current) use of aspirin: Secondary | ICD-10-CM | POA: Insufficient documentation

## 2018-01-17 DIAGNOSIS — G894 Chronic pain syndrome: Secondary | ICD-10-CM

## 2018-01-17 DIAGNOSIS — M792 Neuralgia and neuritis, unspecified: Secondary | ICD-10-CM

## 2018-01-17 DIAGNOSIS — R7881 Bacteremia: Secondary | ICD-10-CM

## 2018-01-17 DIAGNOSIS — M170 Bilateral primary osteoarthritis of knee: Secondary | ICD-10-CM

## 2018-01-17 DIAGNOSIS — I132 Hypertensive heart and chronic kidney disease with heart failure and with stage 5 chronic kidney disease, or end stage renal disease: Secondary | ICD-10-CM

## 2018-01-17 DIAGNOSIS — T8613 Kidney transplant infection: Principal | ICD-10-CM

## 2018-01-17 DIAGNOSIS — J449 Chronic obstructive pulmonary disease, unspecified: Secondary | ICD-10-CM

## 2018-01-17 DIAGNOSIS — B9689 Other specified bacterial agents as the cause of diseases classified elsewhere: Secondary | ICD-10-CM

## 2018-01-17 DIAGNOSIS — B9561 Methicillin susceptible Staphylococcus aureus infection as the cause of diseases classified elsewhere: Secondary | ICD-10-CM

## 2018-01-17 DIAGNOSIS — N186 End stage renal disease: Secondary | ICD-10-CM

## 2018-01-17 DIAGNOSIS — M160 Bilateral primary osteoarthritis of hip: Secondary | ICD-10-CM

## 2018-01-17 DIAGNOSIS — Z5181 Encounter for therapeutic drug level monitoring: Secondary | ICD-10-CM

## 2018-01-17 LAB — URINALYSIS, COMPLETE (UACMP) WITH MICROSCOPIC
BILIRUBIN URINE: NEGATIVE
Glucose, UA: NEGATIVE mg/dL
Hgb urine dipstick: NEGATIVE
KETONES UR: 5 mg/dL — AB
Nitrite: NEGATIVE
PH: 5 (ref 5.0–8.0)
Protein, ur: 30 mg/dL — AB
SPECIFIC GRAVITY, URINE: 1.017 (ref 1.005–1.030)

## 2018-01-17 LAB — COMPREHENSIVE METABOLIC PANEL
ALBUMIN: 3.8 g/dL (ref 3.5–5.0)
ALT: 28 U/L (ref 14–54)
ANION GAP: 8 (ref 5–15)
AST: 23 U/L (ref 15–41)
Alkaline Phosphatase: 80 U/L (ref 38–126)
BUN: 40 mg/dL — AB (ref 6–20)
CHLORIDE: 104 mmol/L (ref 101–111)
CO2: 20 mmol/L — AB (ref 22–32)
Calcium: 10.1 mg/dL (ref 8.9–10.3)
Creatinine, Ser: 1.79 mg/dL — ABNORMAL HIGH (ref 0.44–1.00)
GFR calc Af Amer: 35 mL/min — ABNORMAL LOW (ref >60)
GFR calc non Af Amer: 30 mL/min — ABNORMAL LOW (ref >60)
Glucose, Bld: 135 mg/dL — ABNORMAL HIGH (ref 65–99)
POTASSIUM: 5.1 mmol/L (ref 3.5–5.1)
SODIUM: 132 mmol/L — AB (ref 135–145)
Total Bilirubin: 0.5 mg/dL (ref 0.3–1.2)
Total Protein: 8.3 g/dL — ABNORMAL HIGH (ref 6.5–8.1)

## 2018-01-17 LAB — CBC WITH DIFFERENTIAL/PLATELET
Basophils Absolute: 0 10*3/uL (ref 0–0.1)
Basophils Relative: 0 %
EOS PCT: 0 %
Eosinophils Absolute: 0 10*3/uL (ref 0–0.7)
HEMATOCRIT: 38.9 % (ref 35.0–47.0)
Hemoglobin: 12.5 g/dL (ref 12.0–16.0)
LYMPHS ABS: 0.1 10*3/uL — AB (ref 1.0–3.6)
LYMPHS PCT: 1 %
MCH: 29 pg (ref 26.0–34.0)
MCHC: 32.2 g/dL (ref 32.0–36.0)
MCV: 90.1 fL (ref 80.0–100.0)
MONO ABS: 0.3 10*3/uL (ref 0.2–0.9)
Monocytes Relative: 4 %
NEUTROS ABS: 7.7 10*3/uL — AB (ref 1.4–6.5)
Neutrophils Relative %: 95 %
PLATELETS: 211 10*3/uL (ref 150–440)
RBC: 4.32 MIL/uL (ref 3.80–5.20)
RDW: 19.6 % — AB (ref 11.5–14.5)
WBC: 8.1 10*3/uL (ref 3.6–11.0)

## 2018-01-17 LAB — LACTIC ACID, PLASMA: LACTIC ACID, VENOUS: 0.8 mmol/L (ref 0.5–1.9)

## 2018-01-17 MED ORDER — CALCIUM CARBONATE ANTACID 500 MG PO CHEW
1.0000 | CHEWABLE_TABLET | Freq: Once | ORAL | Status: AC
  Administered 2018-01-17: 200 mg via ORAL
  Filled 2018-01-17: qty 1

## 2018-01-17 MED ORDER — OXYCODONE-ACETAMINOPHEN 5-325 MG PO TABS: 1.0000 | ORAL_TABLET | ORAL | 0 refills | Status: DC | PRN

## 2018-01-17 MED ORDER — OXYCODONE-ACETAMINOPHEN 5-325 MG PO TABS
1.0000 | ORAL_TABLET | Freq: Once | ORAL | Status: AC
  Administered 2018-01-17: 1 via ORAL
  Filled 2018-01-17: qty 1

## 2018-01-17 MED ORDER — DOCUSATE SODIUM 100 MG PO CAPS: 100.0000 mg | ORAL_CAPSULE | Freq: Every day | ORAL | 2 refills | Status: AC | PRN

## 2018-01-17 MED ORDER — SODIUM CHLORIDE 0.9 % IV BOLUS
500.0000 mL | Freq: Once | INTRAVENOUS | Status: AC
  Administered 2018-01-17: 500 mL via INTRAVENOUS

## 2018-01-17 MED ORDER — GI COCKTAIL ~~LOC~~
30.0000 mL | Freq: Once | ORAL | Status: AC
Start: 2018-01-17 — End: 2018-01-17
  Administered 2018-01-17: 30 mL via ORAL
  Filled 2018-01-17: qty 30

## 2018-01-17 MED ORDER — CEPHALEXIN 500 MG PO CAPS: 500.0000 mg | ORAL_CAPSULE | Freq: Three times a day (TID) | ORAL | 0 refills | Status: AC

## 2018-01-17 MED ORDER — CEFTRIAXONE SODIUM 1 G IJ SOLR
1.0000 g | Freq: Once | INTRAMUSCULAR | Status: AC
  Administered 2018-01-17: 1 g via INTRAVENOUS
  Filled 2018-01-17: qty 10

## 2018-01-17 NOTE — Unmapped (Unsigned)
PROGRAF, VALGANCICLOVIR, MYFORTIC FILLED AT [facility] ON 01/12/18. PLEASE ON BOARD TO [facility]. MDB

## 2018-01-17 NOTE — ED Provider Notes (Signed)
Patient received in sign-out from Dr. Burlene Arnt.  Workup and evaluation pending renal ultrasound.  Renal ultrasound is reassuring.  Patient well-appearing and in no acute distress.  Patient given her oral pain medication with improvement.  Patient stable and appropriate follow-up with transplant center.Merlyn Lot, MD 01/17/18 [phone]

## 2018-01-17 NOTE — ED Provider Notes (Addendum)
[facility] Emergency Department Provider Note  ____________________________________________   I have reviewed the triage vital signs and the nursing notes. Where available I have reviewed prior notes and, if possible and indicated, outside hospital notes.    HISTORY  Chief Complaint Abdominal Pain    HPI Geetika Laborde is a 57 y.o. female who had a kidney transfer on 10 days ago at an outside facility, was sent home after that, she normally she states takes Percocet for pain but because of the acute surgery, she took more than normal and ran out yesterday.  Her chronic pain from all of her nerve issues in her post surgery pain have therefore been acting up.  She has tramadol at home but she does not like taking it because it "does not work is good".  She did have a bowel movement today it was nonbloody , it was a large bowel movement the first good when she is had since she went home and she states it was uncomfortable to pass.  There was no melena, she states that she needs some Percocet for her pain.  She does have a JP drain that is draining, she has been able to make urine with no difficulty.  Patient states she is a very poor IV access.  Her pain has been present since the surgery but it became somewhat worse while she was having a large bowel movement. Patient requests Percocet again at the end of the history the pain is "all over" but worse in her lower abdomen while she was actually having a bowel movement.  She states it was gassy and it was hard to pass the gas because it was uncomfortable  Past Medical History:  Diagnosis Date  . Asthma   . CHF (congestive heart failure) ([place])   . COPD (chronic obstructive pulmonary disease) ([place])   . Diabetes mellitus without complication ([place])   . Diastolic heart failure ([place])   . ESRD (end stage renal disease) on dialysis ([place])   . HIV (human immunodeficiency virus infection) ([place])   . Hypertension   . Neuropathy   .  Polycystic kidney disease   . Renal insufficiency     Patient Active Problem List   Diagnosis Date Noted  . Palpitations 12/08/2017  . Accelerated hypertension 09/07/2017  . Lymphedema 05/11/2017  . Diabetes ([place]) 02/10/2017  . Esophageal stricture   . COPD (chronic obstructive pulmonary disease) ([place]) 08/06/2016  . Hypertensive crisis 08/06/2016  . Chronic pain syndrome 06/08/2016  . Anemia 06/08/2016  . Epigastric pain 03/28/2016  . Chronic diastolic heart failure ([place]) 02/04/2016  . Hoarseness of voice 02/04/2016  . Acute on chronic diastolic CHF (congestive heart failure) ([place]) 12/22/2015  . ESRD on dialysis ([place]) 12/22/2015  . Essential hypertension 12/22/2015  . Mixed hyperlipidemia 12/22/2015  . Arm pain, left 05/31/2015  . Closed bilateral ankle fractures, with routine healing, subsequent encounter 07/24/2014  . Closed bilateral ankle fractures, initial encounter 06/20/2014    Past Surgical History:  Procedure Laterality Date  . ABDOMINAL HYSTERECTOMY    . ABDOMINAL SURGERY    . ARTERIOVENOUS GRAFT PLACEMENT Right    x3 (R forearm currently used for access)  . BREAST BIOPSY Left 12/03/2015   neg  . COLON SURGERY    . ESOPHAGOGASTRODUODENOSCOPY (EGD) WITH PROPOFOL N/A 02/04/2017   Procedure: ESOPHAGOGASTRODUODENOSCOPY (EGD) WITH PROPOFOL;  Surgeon: Jonathon Bellows, MD;  Location: [facility] ENDOSCOPY;  Service: Endoscopy;  Laterality: N/A;  . PERIPHERAL VASCULAR CATHETERIZATION Right 08/04/2016   Procedure:  A/V Shuntogram;  Surgeon: Algernon Huxley, MD;  Location: [facility] CV LAB;  Service: Cardiovascular;  Laterality: Right;  . VEIN HARVEST      Prior to Admission medications   Medication Sig Start Date End Date Taking? Authorizing Provider  acetaminophen (TYLENOL) 325 MG tablet Take 2 tablets (650 mg total) by mouth every 6 (six) hours as needed for mild pain or headache. 03/18/16   Gouru, Illene Silver, MD  amitriptyline (ELAVIL) 10 MG tablet Take 10 mg by mouth at bedtime as  needed for sleep.    [provider]  amLODipine (NORVASC) 5 MG tablet Take 5 mg daily by mouth.  08/31/14   [provider]  aspirin 81 MG chewable tablet Chew 1 tablet (81 mg total) by mouth daily. 01/17/16   Gladstone Lighter, MD  b complex-vitamin c-folic acid (NEPHRO-VITE) 0.8 MG TABS tablet Take 1 tablet by mouth daily.     [provider]  budesonide-formoterol (SYMBICORT) 160-4.5 MCG/ACT inhaler Inhale 2 puffs into the lungs 2 (two) times daily.    [provider]  calcium acetate (PHOSLO) 667 MG capsule Take 2 capsules (1,334 mg total) by mouth 3 (three) times daily with meals. Patient taking differently: Take 2,001 mg by mouth 3 (three) times daily with meals.  11/10/17   Loletha Grayer, MD  cloNIDine (CATAPRES) 0.1 MG tablet Take 0.1 mg by mouth daily.    [provider]  epoetin alfa (EPOGEN,PROCRIT) 16109 UNIT/ML injection 10,000 Units 3 (three) times a week.    [provider]  furosemide (LASIX) 80 MG tablet Take 80 mg by mouth daily.    [provider]  gabapentin (NEURONTIN) 300 MG capsule Take 300 mg 2 (two) times daily by mouth.     [provider]  hydrALAZINE (APRESOLINE) 100 MG tablet Take 1 tablet (100 mg total) by mouth 3 (three) times daily. Patient taking differently: Take 100 mg by mouth 3 (three) times daily. Takes 1 tablet if needed (SBP>180) 09/07/17   Bettey Costa, MD  lidocaine (XYLOCAINE) 5 % ointment Apply 1 application topically daily as needed.     [provider]  losartan (COZAAR) 100 MG tablet Take 1 tablet (100 mg total) by mouth daily. 09/07/17   Bettey Costa, MD  metoprolol succinate (TOPROL-XL) 50 MG 24 hr tablet Take 50 mg daily by mouth.     [provider]  nitroGLYCERIN (NITROSTAT) 0.4 MG SL tablet Place 1 tablet (0.4 mg total) under the tongue every 5 (five) minutes as needed for chest pain. 01/14/17 09/17/18  Minna Merritts, MD  omeprazole (PRILOSEC) 20 MG capsule Take  1 capsule by mouth daily. 08/06/17   [provider]  ondansetron (ZOFRAN) 4 MG tablet Take 1 tablet (4 mg total) by mouth every 8 (eight) hours as needed for nausea or vomiting. 07/21/16   Daymon Larsen, MD  oxyCODONE-acetaminophen (PERCOCET) 10-325 MG tablet Take 1 tablet by mouth daily as needed for pain.    [provider]  protein supplement shake (PREMIER PROTEIN) LIQD Take 2 oz by mouth 3 (three) times a week. Tuesday, Thursday, Saturday    [provider]  SENSIPAR 30 MG tablet Take 1 tablet (30 mg total) by mouth daily. 07/07/16   Fritzi Mandes, MD  VENTOLIN HFA 108 (90 Base) MCG/ACT inhaler Inhale 2 puffs into the lungs daily as needed.  05/21/16   [provider]    Allergies Morphine and related; Strawberry extract; Tramadol; Venofer [iron sucrose]; Buprenorphine hcl; and Morphine  Family History  Problem Relation Age of Onset  . Hypertension Brother   . Heart failure Brother   . Hypertension Mother   . Heart disease Mother     Social History Social History   Tobacco Use  . Smoking status: Former Smoker    Packs/day: 0.25    Last attempt to quit: 08/11/2015    Years since quitting: 2.4  . Smokeless tobacco: Never Used  Substance Use Topics  . Alcohol use: No    Alcohol/week: 0.0 oz  . Drug use: No    Review of Systems Constitutional: No fever/chills Eyes: No visual changes. ENT: No sore throat. No stiff neck no neck pain Cardiovascular: Denies chest pain. Respiratory: Denies shortness of breath. Gastrointestinal:   no vomiting.  No diarrhea.  No constipation. Genitourinary: Negative for dysuria. Musculoskeletal: Negative lower extremity swelling Skin: Negative for rash. Neurological: Negative for severe headaches, focal weakness or numbness.   ____________________________________________   PHYSICAL EXAM:  VITAL SIGNS: ED Triage Vitals  Enc Vitals Group     BP 01/17/18 1131 107/66     Pulse Rate 01/17/18 1102 65      Resp 01/17/18 1102 16     Temp 01/17/18 1102 98.1 F (36.7 C)     Temp Source 01/17/18 1102 Oral     SpO2 01/17/18 1102 98 %     Weight --      Height --      Head Circumference --      Peak Flow --      Pain Score 01/17/18 1057 10     Pain Loc --      Pain Edu? --      Excl. in [place]? --     Constitutional: Alert and oriented. Well appearing and in no acute distress. Eyes: Conjunctivae are normal Head: Atraumatic HEENT: No congestion/rhinnorhea. Mucous membranes are moist.  Oropharynx non-erythematous Neck:   Nontender with no meningismus, no masses, no stridor Cardiovascular: Normal rate, regular rhythm. Grossly normal heart sounds.  Good peripheral circulation. Respiratory: Normal respiratory effort.  No retractions. Lungs CTAB. Abdominal: Soft and nontender.  Incision site on the right lower abdomen is clean dry and intact, the Jackson-Pratt drain is draining serous fluid with no evidence of infection noted on the skin around it, nonsurgical abdomen Back:  There is no focal tenderness or step off.  there is no midline tenderness there are no lesions noted. there is no CVA tenderness Musculoskeletal: No lower extremity tenderness, no upper extremity tenderness. No joint effusions, no DVT signs strong distal pulses no edema Neurologic:  Normal speech and language. No gross focal neurologic deficits are appreciated.  Skin:  Skin is warm, dry and intact. No rash noted. Psychiatric: Mood and affect are normal. Speech and behavior are normal.  ____________________________________________   LABS (all labs ordered are listed, but only abnormal results are displayed)  Labs Reviewed  COMPREHENSIVE METABOLIC PANEL  CBC WITH DIFFERENTIAL/PLATELET  URINALYSIS, COMPLETE (UACMP) WITH MICROSCOPIC  LACTIC ACID, PLASMA  LACTIC ACID, PLASMA    Pertinent labs  results that were available during my care of the patient were reviewed by me and considered in my medical decision making (see chart  for details). ____________________________________________  EKG  I personally interpreted any EKGs ordered by me or triage  ____________________________________________  RADIOLOGY  Pertinent labs & imaging results that were available during my care of the patient were reviewed by me and considered in my medical decision making (see chart for details). If possible, patient  and/or family made aware of any abnormal findings.  No results found. ____________________________________________    PROCEDURES  Procedure(s) performed:  EJ performed by me  Indication, needs IV access Patient consents verbally, Risk benefits alternatives explained and consented to Timeout performed sterile prep,  no complications 2 passes  left EJ Good blood return with Angiocath.  No complications,  patient tolerated well Critical Care performed: None  ____________________________________________   INITIAL IMPRESSION / ASSESSMENT AND PLAN / ED COURSE  Pertinent labs & imaging results that were available during my care of the patient were reviewed by me and considered in my medical decision making (see chart for details).  Patient had a uncomfortable bowel movement in the context of having run out of her chronic Percocet we gave her Percocet here and she is pain-free she states.  Her abdomen is benign although she did very recently have surgery, she had no IV access.  Vital signs are reassuring, patient with chronic pain issues that are certainly made worse by the fact that she ran out of her Percocet in the postoperative period.  Her abdomen is benign to me there is some minimal incisional units which I would expect.  Given we do not have records on her I will perform a noncontrasted CT of the abdomen pelvis to make sure there is no evidence of surgical complication given her complaint of pain which is difficult to tease out given chronic pain and a history which is somewhat confusing.  She remains  asymptomatic at this time by her own opinion after a Percocet here which is her primary.  Patient during her H&P.  If CT and blood work are reassuring and urinalysis etc., I hope that we can safely get her home with pain medication and close outpatient follow-up with her transplant coordinator.  ----------------------------------------- 3:38 PM on 01/17/2018 -----------------------------------------  Urine is somewhat equivocal, I did discuss with Dr. Doyne Keel, of transplant surgery at [facility] very much appreciate the consult.  He and I discussed the patient's physical findings, imaging, blood work, history and complaint etc.  He feels that if the patient's ultrasound of her transplant shows patent vessels she should be good to go home.  Initially he thought it might not be a bad idea to wait for cultures given that the patient is asymptomatic with her urine but after consultation we decided to give her Rocephin as a precaution and send her home with , Keflex.  Patient remains in no acute distress here resting comfortably in the bed.  She states her pain was relieved by Percocet although she always has a great deal of pain so it is hard to tell if it was completely relieved. Signed out to dr. Quentin Cornwall at the end of my shift.    ____________________________________________   FINAL CLINICAL IMPRESSION(S) / ED DIAGNOSES  Final diagnoses:  None      This chart was dictated using voice recognition software.  Despite best efforts to proofread,  errors can occur which can change meaning.      Schuyler Amor, MD 01/17/18 1411    Schuyler Amor, MD 01/17/18 1539    Schuyler Amor, MD 01/17/18 1545    Schuyler Amor, MD 02/01/18 [phone]

## 2018-01-17 NOTE — ED Triage Notes (Signed)
Pt arrived via [facility] from home with c/o abdominal pain. EMS states pt was trying to have a bowel movement and pain started being a "10/10." Pt has recently had a kidney transplant at [facility] and has been taking percocet but ran out yesterday.

## 2018-01-17 NOTE — Discharge Instructions (Signed)

## 2018-01-18 ENCOUNTER — Emergency Department
Admission: EM | Admit: 2018-01-18 | Discharge: 2018-01-18 | Disposition: A | Discharge status: Home / Self Care | Payer: Medicare Other | Attending: Emergency Medicine | Admitting: Emergency Medicine

## 2018-01-18 ENCOUNTER — Encounter: Payer: Self-pay | Admitting: Emergency Medicine

## 2018-01-18 DIAGNOSIS — R1084 Generalized abdominal pain: Secondary | ICD-10-CM | POA: Diagnosis not present

## 2018-01-18 DIAGNOSIS — I132 Hypertensive heart and chronic kidney disease with heart failure and with stage 5 chronic kidney disease, or end stage renal disease: Secondary | ICD-10-CM | POA: Diagnosis not present

## 2018-01-18 DIAGNOSIS — B20 Human immunodeficiency virus [HIV] disease: Secondary | ICD-10-CM | POA: Insufficient documentation

## 2018-01-18 DIAGNOSIS — Z79899 Other long term (current) drug therapy: Secondary | ICD-10-CM | POA: Diagnosis not present

## 2018-01-18 DIAGNOSIS — E782 Mixed hyperlipidemia: Secondary | ICD-10-CM | POA: Insufficient documentation

## 2018-01-18 DIAGNOSIS — Z7982 Long term (current) use of aspirin: Secondary | ICD-10-CM | POA: Diagnosis not present

## 2018-01-18 DIAGNOSIS — Z87891 Personal history of nicotine dependence: Secondary | ICD-10-CM | POA: Diagnosis not present

## 2018-01-18 DIAGNOSIS — E119 Type 2 diabetes mellitus without complications: Secondary | ICD-10-CM | POA: Insufficient documentation

## 2018-01-18 DIAGNOSIS — I5032 Chronic diastolic (congestive) heart failure: Secondary | ICD-10-CM | POA: Insufficient documentation

## 2018-01-18 DIAGNOSIS — J449 Chronic obstructive pulmonary disease, unspecified: Secondary | ICD-10-CM | POA: Insufficient documentation

## 2018-01-18 DIAGNOSIS — N186 End stage renal disease: Secondary | ICD-10-CM | POA: Insufficient documentation

## 2018-01-18 DIAGNOSIS — Z992 Dependence on renal dialysis: Secondary | ICD-10-CM | POA: Insufficient documentation

## 2018-01-18 LAB — URINALYSIS, COMPLETE (UACMP) WITH MICROSCOPIC
BACTERIA UA: NONE SEEN
Bilirubin Urine: NEGATIVE
GLUCOSE, UA: NEGATIVE mg/dL
Hgb urine dipstick: NEGATIVE
Ketones, ur: 5 mg/dL — AB
Nitrite: NEGATIVE
Protein, ur: 30 mg/dL — AB
Specific Gravity, Urine: 1.013 (ref 1.005–1.030)
pH: 7 (ref 5.0–8.0)

## 2018-01-18 LAB — CBC
HCT: 41.7 % (ref 35.0–47.0)
Hemoglobin: 13.5 g/dL (ref 12.0–16.0)
MCH: 29.4 pg (ref 26.0–34.0)
MCHC: 32.3 g/dL (ref 32.0–36.0)
MCV: 91.1 fL (ref 80.0–100.0)
PLATELETS: 273 10*3/uL (ref 150–440)
RBC: 4.58 MIL/uL (ref 3.80–5.20)
RDW: 19.8 % — ABNORMAL HIGH (ref 11.5–14.5)
WBC: 11.1 10*3/uL — ABNORMAL HIGH (ref 3.6–11.0)

## 2018-01-18 LAB — COMPREHENSIVE METABOLIC PANEL
ALT: 34 U/L (ref 14–54)
AST: 33 U/L (ref 15–41)
Albumin: 4.5 g/dL (ref 3.5–5.0)
Alkaline Phosphatase: 100 U/L (ref 38–126)
Anion gap: 12 (ref 5–15)
BUN: 33 mg/dL — AB (ref 6–20)
CHLORIDE: 102 mmol/L (ref 101–111)
CO2: 23 mmol/L (ref 22–32)
CREATININE: 1.63 mg/dL — AB (ref 0.44–1.00)
Calcium: 11.4 mg/dL — ABNORMAL HIGH (ref 8.9–10.3)
GFR calc Af Amer: 39 mL/min — ABNORMAL LOW (ref >60)
GFR calc non Af Amer: 33 mL/min — ABNORMAL LOW (ref >60)
Glucose, Bld: 109 mg/dL — ABNORMAL HIGH (ref 65–99)
Potassium: 5.3 mmol/L — ABNORMAL HIGH (ref 3.5–5.1)
SODIUM: 137 mmol/L (ref 135–145)
Total Bilirubin: 0.9 mg/dL (ref 0.3–1.2)
Total Protein: 9.8 g/dL — ABNORMAL HIGH (ref 6.5–8.1)

## 2018-01-18 LAB — LIPASE, BLOOD: LIPASE: 21 U/L (ref 11–51)

## 2018-01-18 MED ORDER — OMEPRAZOLE 40 MG CAPSULE,DELAYED RELEASE: 40 mg | capsule | Freq: Every day | 3 refills | 0 days | Status: AC

## 2018-01-18 MED ORDER — OMEPRAZOLE 40 MG CAPSULE,DELAYED RELEASE
ORAL_CAPSULE | Freq: Every day | ORAL | 0 refills | 0.00000 days | Status: CP
Start: 2018-01-18 — End: 2018-01-18

## 2018-01-18 MED ORDER — METHYLNALTREXONE BROMIDE 12 MG/0.6ML ~~LOC~~ SOLN
6.0000 mg | Freq: Once | SUBCUTANEOUS | Status: AC
  Administered 2018-01-18: 6 mg via SUBCUTANEOUS
  Filled 2018-01-18: qty 0.6

## 2018-01-18 MED ORDER — OXYCODONE-ACETAMINOPHEN 5-325 MG PO TABS
1.0000 | ORAL_TABLET | ORAL | Status: DC | PRN
  Administered 2018-01-18: 1 via ORAL
  Filled 2018-01-18: qty 1

## 2018-01-18 MED ORDER — HALOPERIDOL LACTATE 5 MG/ML IJ SOLN
5.0000 mg | Freq: Once | INTRAMUSCULAR | Status: AC
  Administered 2018-01-18: 5 mg via INTRAVENOUS
  Filled 2018-01-18: qty 1

## 2018-01-18 NOTE — Unmapped (Signed)
6/11: Patient states that she has had horrible acid reflux and was told at discharge to purchase prilosec OTC. She cannot afford this, so requested rx for prilosec or similar to be sent to [name] for putting on charge account. She states already left a message for txp coordinator this am but hasn't heard back. I will tenatively schedule a rx for omeprazole for tomorrow delivery, request rx from coordinator, and put note for order entry to contact patient if med is NOT received in time-EF    [facility]   Patient Onboarding/Medication Counseling    Danielle Parker is a 57 y.o. female with recent kidney transplant who I am counseling today on initiation of therapy. Patient states was counseled as inpatient and at clinic, declined education at this time.    Medication: prograf 1mg , myfortic 180mg , valganciclovir 450mg     Verified patient's date of birth / HIPAA.      Education Provided: ??    Dose/Administration discussed: patient declined education.     Storage requirements: patient declined education.    Side effects / precautions discussed: patient declined education..  Patient will receive a drug information handout with shipment.    Handling precautions / disposal reviewed: patient declined education.    Drug Interactions: other medications reviewed and up to date in Epic. patient declined education.    Comorbidities/Allergies: reviewed and up to date in Epic.    Verified therapy is appropriate and should continue      Delivery Information    Medication Assistance provided: none    Anticipated copay of $0 on prograf and myfortic, $1.25 on valganciclovir reviewed with patient. Verified delivery address in [facility] and reviewed medication storage requirement.    Scheduled delivery date: patient declined delivery today, just filled at [facility] last week at discharge. Will call back in 1.5 weeks per patient request. She is aware to call us prior to then if needs anything.    Explained that we ship using UPS or courier and this shipment will require a signature.      Explained the services we provide at [facility] and that each month we would call to set up refills.  Stressed importance of returning phone calls so that we could ensure they receive their medications in time each month.  Informed patient that we should be setting up refills 7-10 days prior to when they will run out of medication.  Informed patient that welcome packet will be sent.      Patient verbalized understanding of the above information as well as how to contact the pharmacy at [phone] option 4 with any questions/concerns.  The pharmacy is open Monday through Friday 8:30am-4:30pm.  A pharmacist is available 24/7 via pager to answer any clinical questions they may have.        Patient Specific Needs      ? Patient has no physical, cognitive, or cultural barriers.    ? Patient prefers to have medications discussed with  Patient     ? Patient is able to read and understand education materials at a high school level or above.    ? Patient's primary language is  English           Danielle Parker  [facility] Specialty Pharmacist

## 2018-01-18 NOTE — ED Triage Notes (Signed)
Patient presents to the ED via EMS from home for abdominal pain.  Patient is post kidney transplant x 2 weeks.  Patient was seen in the ED for the same complaint yesterday.  Patient was given prescriptions for pain medication and colace yesterday.  Patient has not been able to get medications filled because her neighbor who normally gives her a ride has not been available.  Patient's transplant was at [facility] and patient states her transplant doctor told patient to come to [facility] but again patient states she did not have a ride.  Patient states her pain began after taking one of her medications today.

## 2018-01-18 NOTE — ED Notes (Signed)
Writer assisted pt to bed side toilet.

## 2018-01-18 NOTE — Discharge Instructions (Signed)
It was a pleasure to take care of you today, and thank you for coming to our emergency department.  If you have any questions or concerns before leaving please ask the nurse to grab me and I'm more than happy to go through your aftercare instructions again.  If you were prescribed any opioid pain medication today such as Norco, Vicodin, Percocet, morphine, hydrocodone, or oxycodone please make sure you do not drive when you are taking this medication as it can alter your ability to drive safely.  If you have any concerns once you are home that you are not improving or are in fact getting worse before you can make it to your follow-up appointment, please do not hesitate to call 911 and come back for further evaluation.  Darel Hong, MD  Results for orders placed or performed during the hospital encounter of 01/18/18  Lipase, blood  Result Value Ref Range   Lipase 21 11 - 51 U/L  Comprehensive metabolic panel  Result Value Ref Range   Sodium 137 135 - 145 mmol/L   Potassium 5.3 (H) 3.5 - 5.1 mmol/L   Chloride 102 101 - 111 mmol/L   CO2 23 22 - 32 mmol/L   Glucose, Bld 109 (H) 65 - 99 mg/dL   BUN 33 (H) 6 - 20 mg/dL   Creatinine, Ser 1.63 (H) 0.44 - 1.00 mg/dL   Calcium 11.4 (H) 8.9 - 10.3 mg/dL   Total Protein 9.8 (H) 6.5 - 8.1 g/dL   Albumin 4.5 3.5 - 5.0 g/dL   AST 33 15 - 41 U/L   ALT 34 14 - 54 U/L   Alkaline Phosphatase 100 38 - 126 U/L   Total Bilirubin 0.9 0.3 - 1.2 mg/dL   GFR calc non Af Amer 33 (L) >60 mL/min   GFR calc Af Amer 39 (L) >60 mL/min   Anion gap 12 5 - 15  CBC  Result Value Ref Range   WBC 11.1 (H) 3.6 - 11.0 K/uL   RBC 4.58 3.80 - 5.20 MIL/uL   Hemoglobin 13.5 12.0 - 16.0 g/dL   HCT 41.7 35.0 - 47.0 %   MCV 91.1 80.0 - 100.0 fL   MCH 29.4 26.0 - 34.0 pg   MCHC 32.3 32.0 - 36.0 g/dL   RDW 19.8 (H) 11.5 - 14.5 %   Platelets 273 150 - 440 K/uL  Urinalysis, Complete w Microscopic  Result Value Ref Range   Color, Urine YELLOW (A) YELLOW   APPearance  CLEAR (A) CLEAR   Specific Gravity, Urine 1.013 1.005 - 1.030   pH 7.0 5.0 - 8.0   Glucose, UA NEGATIVE NEGATIVE mg/dL   Hgb urine dipstick NEGATIVE NEGATIVE   Bilirubin Urine NEGATIVE NEGATIVE   Ketones, ur 5 (A) NEGATIVE mg/dL   Protein, ur 30 (A) NEGATIVE mg/dL   Nitrite NEGATIVE NEGATIVE   Leukocytes, UA TRACE (A) NEGATIVE   RBC / HPF 0-5 0 - 5 RBC/hpf   WBC, UA 0-5 0 - 5 WBC/hpf   Bacteria, UA NONE SEEN NONE SEEN   Squamous Epithelial / LPF 0-5 0 - 5   US Renal Transplant W/doppler  Result Date: 01/17/2018 CLINICAL DATA:  57 year old with abdominal pain after renal transplant. Renal transplant on 01/08/2018. EXAM: ULTRASOUND OF RENAL TRANSPLANT WITH RENAL DOPPLER ULTRASOUND TECHNIQUE: Ultrasound examination of the renal transplant was performed with gray-scale, color and duplex doppler evaluation. COMPARISON:  CT 01/17/2018 FINDINGS: Transplant kidney location: RLQ Transplant Kidney: Length: 10.0 cm. Normal in size and parenchymal  echogenicity. No hydronephrosis. No significant fluid around the transplant kidney. Small echogenic focus in the lower pole measures roughly 0.6 cm. There is a small calcification in this area on the recent CT and this could represent a small nonobstructive kidney stone. Color flow in the main renal artery:  Yes Color flow in the main renal vein:  Yes Duplex Doppler Evaluation: Main Renal Artery Resistive Index: 0.81 Venous waveform in main renal vein:  Present Intrarenal resistive index in upper pole:  0.75 (normal 0.6-0.8; equivocal 0.8-0.9; abnormal >= 0.9) Intrarenal resistive index in lower pole: 0.74 (normal 0.6-0.8; equivocal 0.8-0.9; abnormal >= 0.9) Bladder: Normal for degree of bladder distention. Ureter stent is present. Other findings:  Native kidneys are atrophic. IMPRESSION: No acute abnormality. Negative for hydronephrosis. Ureter stent is present. Small calcification in the lower pole is suggestive for a nonobstructive renal calculus, better seen on  recent CT. Electronically Signed   By: Markus Daft M.D.   On: 01/17/2018 17:33   Mm 3d Screen Breast Bilateral  Result Date: 01/07/2018 CLINICAL DATA:  Screening. EXAM: DIGITAL SCREENING BILATERAL MAMMOGRAM WITH TOMO AND CAD COMPARISON:  Previous exam(s). ACR Breast Density Category b: There are scattered areas of fibroglandular density. FINDINGS: In the right breast, calcifications warrant further evaluation with magnified views. In the left breast, no findings suspicious for malignancy. Images were processed with CAD. IMPRESSION: Further evaluation is suggested for calcifications in the right breast. RECOMMENDATION: Diagnostic mammogram of the right breast. (Code:[id number]) The patient will be contacted regarding the findings, and additional imaging will be scheduled. BI-RADS CATEGORY  0: Incomplete. Need additional imaging evaluation and/or prior mammograms for comparison. Electronically Signed   By: Evangeline Dakin M.D.   On: 01/07/2018 12:36   Ct Renal Stone Study  Result Date: 01/17/2018 CLINICAL DATA:  Abdominal pain EXAM: CT ABDOMEN AND PELVIS WITHOUT CONTRAST TECHNIQUE: Multidetector CT imaging of the abdomen and pelvis was performed following the standard protocol without IV contrast. COMPARISON:  03/28/2016 FINDINGS: Lower chest: No acute abnormality. Hepatobiliary: No focal liver abnormality is seen. No gallstones, gallbladder wall thickening, or biliary dilatation. Pancreas: Unremarkable. No pancreatic ductal dilatation or surrounding inflammatory changes. Spleen: Normal in size without focal abnormality. Adrenals/Urinary Tract: Adrenal glands are within normal limits. The native kidneys are shrunken with bilateral cystic change consistent with the patient's known history of end-stage renal disease. Changes consistent with recent pelvic renal transplant on the right are noted. A ureteral stent is noted extending into the bladder. Largest of these measures 3 mm. Surgical drain is noted in place.  No perirenal fluid collection is noted. The bladder is well distended. Stomach/Bowel: Scattered diverticular change of the colon is noted. No diverticulitis is seen. The appendix is not well visualized although no inflammatory changes to suggest appendicitis are noted. The small bowel and stomach are within normal limits. Vascular/Lymphatic: Aortic atherosclerosis. No enlarged abdominal or pelvic lymph nodes. Reproductive: Uterus and bilateral adnexa are unremarkable. Other: No abdominal wall hernia or abnormality. No abdominopelvic ascites. Musculoskeletal: No acute or significant osseous findings. IMPRESSION: Status post renal transplant in the right hemipelvis. No perinephric fluid collection is identified. A few small renal calculi are noted. No obstructive changes are seen. Surgical drain is noted in place. Changes consistent with end-stage renal disease. Diverticulosis without diverticulitis. Electronically Signed   By: Inez Catalina M.D.   On: 01/17/2018 14:47

## 2018-01-18 NOTE — ED Notes (Addendum)
Pt reports lower abdominal pain that radiates to her back. Was here yesterday.

## 2018-01-18 NOTE — ED Notes (Signed)
Attempted to get blood with butterfly collection set x 2 to left hand/wrist area.  No success.  First Nurse notified.

## 2018-01-18 NOTE — ED Provider Notes (Signed)
[facility] Emergency Department Provider Note  ____________________________________________   First MD Initiated Contact with Patient 01/18/18 1810     (approximate)  I have reviewed the triage vital signs and the nursing notes.   HISTORY  Chief Complaint Abdominal Pain   HPI Jasmine Holloway is a 57 y.o. female comes to the emergency department via EMS for roughly 2 weeks of abdominal pain.  She is 2 weeks status post kidney transplant at [facility] and has had persistent symptoms ever since.  She was seen in our emergency department yesterday where she had a CT scan noncontrast, blood work, and a renal ultrasound which were all unremarkable.  The physician yesterday spoke with the patient's transplant physician and she was felt to be stable for outpatient management.  She had persistent pain today but was unable to get a ride down to [facility] so she came back to the emergency department.  Pain is diffuse moderate severity nothing seems to make it better or worse some nausea no vomiting.  She has had some constipation.  She has been using Percocet around-the-clock with minimal relief.  Past Medical History:  Diagnosis Date  . Asthma   . CHF (congestive heart failure) ([place])   . COPD (chronic obstructive pulmonary disease) ([place])   . Diabetes mellitus without complication ([place])   . Diastolic heart failure ([place])   . ESRD (end stage renal disease) on dialysis ([place])   . HIV (human immunodeficiency virus infection) ([place])   . Hypertension   . Neuropathy   . Polycystic kidney disease   . Renal insufficiency     Patient Active Problem List   Diagnosis Date Noted  . Palpitations 12/08/2017  . Accelerated hypertension 09/07/2017  . Lymphedema 05/11/2017  . Diabetes ([place]) 02/10/2017  . Esophageal stricture   . COPD (chronic obstructive pulmonary disease) ([place]) 08/06/2016  . Hypertensive crisis 08/06/2016  . Chronic pain syndrome 06/08/2016  . Anemia 06/08/2016  . Epigastric  pain 03/28/2016  . Chronic diastolic heart failure ([place]) 02/04/2016  . Hoarseness of voice 02/04/2016  . Acute on chronic diastolic CHF (congestive heart failure) ([place]) 12/22/2015  . ESRD on dialysis ([place]) 12/22/2015  . Essential hypertension 12/22/2015  . Mixed hyperlipidemia 12/22/2015  . Arm pain, left 05/31/2015  . Closed bilateral ankle fractures, with routine healing, subsequent encounter 07/24/2014  . Closed bilateral ankle fractures, initial encounter 06/20/2014    Past Surgical History:  Procedure Laterality Date  . ABDOMINAL HYSTERECTOMY    . ABDOMINAL SURGERY    . ARTERIOVENOUS GRAFT PLACEMENT Right    x3 (R forearm currently used for access)  . BREAST BIOPSY Left 12/03/2015   neg  . COLON SURGERY    . ESOPHAGOGASTRODUODENOSCOPY (EGD) WITH PROPOFOL N/A 02/04/2017   Procedure: ESOPHAGOGASTRODUODENOSCOPY (EGD) WITH PROPOFOL;  Surgeon: Jonathon Bellows, MD;  Location[facility] ENDOSCOPY;  Service: Endoscopy;  Laterality: N/A;  . PERIPHERAL VASCULAR CATHETERIZATION Right 08/04/2016   Procedure: A/V Shuntogram;  Surgeon: Algernon Huxley, MD;  Location: [facility] CV LAB;  Service: Cardiovascular;  Laterality: Right;  . VEIN HARVEST      Prior to Admission medications   Medication Sig Start Date End Date Taking? Authorizing Provider  acetaminophen (TYLENOL) 325 MG tablet Take 2 tablets (650 mg total) by mouth every 6 (six) hours as needed for mild pain or headache. 03/18/16   Gouru, Illene Silver, MD  amitriptyline (ELAVIL) 10 MG tablet Take 10 mg by mouth at bedtime as needed for sleep.    [provider]  amLODipine (NORVASC) 5 MG tablet Take 5 mg daily by mouth.  08/31/14   [provider]  aspirin 81 MG chewable tablet Chew 1 tablet (81 mg total) by mouth daily. 01/17/16   Gladstone Lighter, MD  b complex-vitamin c-folic acid (NEPHRO-VITE) 0.8 MG TABS tablet Take 1 tablet by mouth daily.     [provider]  budesonide-formoterol (SYMBICORT) 160-4.5 MCG/ACT inhaler  Inhale 2 puffs into the lungs 2 (two) times daily.    [provider]  calcium acetate (PHOSLO) 667 MG capsule Take 2 capsules (1,334 mg total) by mouth 3 (three) times daily with meals. Patient taking differently: Take 2,001 mg by mouth 3 (three) times daily with meals.  11/10/17   Loletha Grayer, MD  cephALEXin (KEFLEX) 500 MG capsule Take 1 capsule (500 mg total) by mouth 3 (three) times daily for 7 days. 01/17/18 01/24/18  Schuyler Amor, MD  cloNIDine (CATAPRES) 0.1 MG tablet Take 0.1 mg by mouth daily.    [provider]  docusate sodium (COLACE) 100 MG capsule Take 1 capsule (100 mg total) by mouth daily as needed. 01/17/18 01/17/19  Schuyler Amor, MD  epoetin alfa (EPOGEN,PROCRIT) 16109 UNIT/ML injection 10,000 Units 3 (three) times a week.    [provider]  furosemide (LASIX) 80 MG tablet Take 80 mg by mouth daily.    [provider]  gabapentin (NEURONTIN) 300 MG capsule Take 300 mg 2 (two) times daily by mouth.     [provider]  hydrALAZINE (APRESOLINE) 100 MG tablet Take 1 tablet (100 mg total) by mouth 3 (three) times daily. Patient taking differently: Take 100 mg by mouth 3 (three) times daily. Takes 1 tablet if needed (SBP>180) 09/07/17   Bettey Costa, MD  lidocaine (XYLOCAINE) 5 % ointment Apply 1 application topically daily as needed.     [provider]  losartan (COZAAR) 100 MG tablet Take 1 tablet (100 mg total) by mouth daily. 09/07/17   Bettey Costa, MD  metoprolol succinate (TOPROL-XL) 50 MG 24 hr tablet Take 50 mg daily by mouth.     [provider]  nitroGLYCERIN (NITROSTAT) 0.4 MG SL tablet Place 1 tablet (0.4 mg total) under the tongue every 5 (five) minutes as needed for chest pain. 01/14/17 09/17/18  Minna Merritts, MD  omeprazole (PRILOSEC) 20 MG capsule Take 1 capsule by mouth daily. 08/06/17   [provider]  ondansetron (ZOFRAN) 4 MG tablet Take 1 tablet (4 mg total) by mouth every 8 (eight)  hours as needed for nausea or vomiting. 07/21/16   Daymon Larsen, MD  oxyCODONE-acetaminophen (PERCOCET) 10-325 MG tablet Take 1 tablet by mouth daily as needed for pain.    [provider]  oxyCODONE-acetaminophen (PERCOCET) 5-325 MG tablet Take 1 tablet by mouth every 4 (four) hours as needed for severe pain. 01/17/18 01/17/19  Schuyler Amor, MD  protein supplement shake (PREMIER PROTEIN) LIQD Take 2 oz by mouth 3 (three) times a week. Tuesday, Thursday, Saturday    [provider]  SENSIPAR 30 MG tablet Take 1 tablet (30 mg total) by mouth daily. 07/07/16   Fritzi Mandes, MD  VENTOLIN HFA 108 (90 Base) MCG/ACT inhaler Inhale 2 puffs into the lungs daily as needed.  05/21/16   [provider]    Allergies Morphine and related; Strawberry extract; Tramadol; Venofer [iron sucrose]; Buprenorphine hcl; and Morphine  Family History  Problem Relation Age of Onset  . Hypertension Brother   . Heart failure Brother   .  Hypertension Mother   . Heart disease Mother     Social History Social History   Tobacco Use  . Smoking status: Former Smoker    Packs/day: 0.25    Last attempt to quit: 08/11/2015    Years since quitting: 2.4  . Smokeless tobacco: Never Used  Substance Use Topics  . Alcohol use: No    Alcohol/week: 0.0 oz  . Drug use: No    Review of Systems Constitutional: No fever/chills Eyes: No visual changes. ENT: No sore throat. Cardiovascular: Denies chest pain. Respiratory: Denies shortness of breath. Gastrointestinal: Positive for abdominal pain.  Positive for nausea, no vomiting.  No diarrhea.  Positive for constipation. Genitourinary: Negative for dysuria. Musculoskeletal: Negative for back pain. Skin: Negative for rash. Neurological: Negative for headaches, focal weakness or numbness.   ____________________________________________   PHYSICAL EXAM:  VITAL SIGNS: ED Triage Vitals  Enc Vitals Group     BP 01/18/18 1508 123/85      Pulse Rate 01/18/18 1508 76     Resp 01/18/18 1508 18     Temp 01/18/18 1508 98 F (36.7 C)     Temp Source 01/18/18 1508 Oral     SpO2 01/18/18 1508 100 %     Weight 01/18/18 1510 171 lb (77.6 kg)     Height 01/18/18 1510 5\' 3"  (1.6 m)     Head Circumference --      Peak Flow --      Pain Score 01/18/18 1508 9     Pain Loc --      Pain Edu? --      Excl. in [place]? --     Constitutional: Alert and oriented x4 appears developmentally delayed and chronically ill-appearing but in no acute distress Eyes: PERRL EOMI. Head: Atraumatic. Nose: No congestion/rhinnorhea. Mouth/Throat: No trismus Neck: No stridor.   Cardiovascular: Normal rate, regular rhythm. Grossly normal heart sounds.  Good peripheral circulation. Respiratory: Normal respiratory effort.  No retractions. Lungs CTAB and moving good air Gastrointestinal: Soft nondistended nontender no rebound or guarding no peritonitis surgical sites appear healthy Musculoskeletal: No lower extremity edema   Neurologic:   No gross focal neurologic deficits are appreciated. Skin:  Skin is warm, dry and intact. No rash noted. Psychiatric: Mood and affect are normal. Speech and behavior are normal.    ____________________________________________   DIFFERENTIAL includes but not limited to  Transplant rejection, renal failure, postoperative infection ____________________________________________   LABS (all labs ordered are listed, but only abnormal results are displayed)  Labs Reviewed  COMPREHENSIVE METABOLIC PANEL - Abnormal; Notable for the following components:      Result Value   Potassium 5.3 (*)    Glucose, Bld 109 (*)    BUN 33 (*)    Creatinine, Ser 1.63 (*)    Calcium 11.4 (*)    Total Protein 9.8 (*)    GFR calc non Af Amer 33 (*)    GFR calc Af Amer 39 (*)    All other components within normal limits  CBC - Abnormal; Notable for the following components:   WBC 11.1 (*)    RDW 19.8 (*)    All other components within  normal limits  URINALYSIS, COMPLETE (UACMP) WITH MICROSCOPIC - Abnormal; Notable for the following components:   Color, Urine YELLOW (*)    APPearance CLEAR (*)    Ketones, ur 5 (*)    Protein, ur 30 (*)    Leukocytes, UA TRACE (*)    All other components within normal  limits  LIPASE, BLOOD  MYCOPHENOLIC ACID (CELLCEPT)  TACROLIMUS LEVEL    Lab work reviewed by me with no acute disease __________________________________________  EKG   ____________________________________________  RADIOLOGY   ____________________________________________   PROCEDURES  Procedure(s) performed: no  Procedures  Critical Care performed: no  Observation: no ____________________________________________   INITIAL IMPRESSION / ASSESSMENT AND PLAN / ED COURSE  Pertinent labs & imaging results that were available during my care of the patient were reviewed by me and considered in my medical decision making (see chart for details).       ----------------------------------------- 8:50 PM on 01/18/2018 -----------------------------------------  Patient's pain is down to a 0 out of 10 and she would like to go home.  As she already has opioids for home I will not prescribe her any further.  She has follow-up care well-established.  She is discharged home in improved condition verbalizes understanding and agreement with the plan. ____________________________________________   FINAL CLINICAL IMPRESSION(S) / ED DIAGNOSES  Final diagnoses:  Generalized abdominal pain      NEW MEDICATIONS STARTED DURING THIS VISIT:  Discharge Medication List as of 01/18/2018  8:51 PM       Note:  This document was prepared using Dragon voice recognition software and may include unintentional dictation errors.     Darel Hong, MD 01/21/18 1137

## 2018-01-19 ENCOUNTER — Emergency Department: Admit: 2018-01-19 | Discharge: 2018-01-20 | Disposition: A | Discharge status: Home / Self Care | Payer: 59

## 2018-01-19 ENCOUNTER — Encounter: Admit: 2018-01-19 | Discharge: 2018-01-20 | Disposition: A | Discharge status: Home / Self Care | Payer: 59

## 2018-01-19 ENCOUNTER — Other Ambulatory Visit: Payer: Self-pay | Admitting: Family Medicine

## 2018-01-19 DIAGNOSIS — R928 Other abnormal and inconclusive findings on diagnostic imaging of breast: Secondary | ICD-10-CM

## 2018-01-19 DIAGNOSIS — R921 Mammographic calcification found on diagnostic imaging of breast: Secondary | ICD-10-CM

## 2018-01-19 DIAGNOSIS — B9689 Other specified bacterial agents as the cause of diseases classified elsewhere: Secondary | ICD-10-CM

## 2018-01-19 DIAGNOSIS — J449 Chronic obstructive pulmonary disease, unspecified: Secondary | ICD-10-CM

## 2018-01-19 DIAGNOSIS — B9561 Methicillin susceptible Staphylococcus aureus infection as the cause of diseases classified elsewhere: Secondary | ICD-10-CM

## 2018-01-19 DIAGNOSIS — N186 End stage renal disease: Secondary | ICD-10-CM

## 2018-01-19 DIAGNOSIS — R7881 Bacteremia: Secondary | ICD-10-CM

## 2018-01-19 DIAGNOSIS — M792 Neuralgia and neuritis, unspecified: Secondary | ICD-10-CM

## 2018-01-19 DIAGNOSIS — M170 Bilateral primary osteoarthritis of knee: Secondary | ICD-10-CM

## 2018-01-19 DIAGNOSIS — T8613 Kidney transplant infection: Principal | ICD-10-CM

## 2018-01-19 DIAGNOSIS — Z940 Kidney transplant status: Secondary | ICD-10-CM

## 2018-01-19 DIAGNOSIS — Z5181 Encounter for therapeutic drug level monitoring: Secondary | ICD-10-CM

## 2018-01-19 DIAGNOSIS — M160 Bilateral primary osteoarthritis of hip: Secondary | ICD-10-CM

## 2018-01-19 DIAGNOSIS — I5032 Chronic diastolic (congestive) heart failure: Secondary | ICD-10-CM

## 2018-01-19 DIAGNOSIS — Z79899 Other long term (current) drug therapy: Secondary | ICD-10-CM

## 2018-01-19 DIAGNOSIS — I132 Hypertensive heart and chronic kidney disease with heart failure and with stage 5 chronic kidney disease, or end stage renal disease: Secondary | ICD-10-CM

## 2018-01-19 DIAGNOSIS — R109 Unspecified abdominal pain: Principal | ICD-10-CM

## 2018-01-19 DIAGNOSIS — Z87891 Personal history of nicotine dependence: Secondary | ICD-10-CM

## 2018-01-19 DIAGNOSIS — G894 Chronic pain syndrome: Secondary | ICD-10-CM

## 2018-01-19 LAB — CBC W/ AUTO DIFF
BASOPHILS ABSOLUTE COUNT: 0 10*9/L (ref 0.0–0.1)
BASOPHILS ABSOLUTE COUNT: 0.1 10*9/L (ref 0.0–0.1)
BASOPHILS RELATIVE PERCENT: 0.4 %
EOSINOPHILS ABSOLUTE COUNT: 0 10*9/L (ref 0.0–0.4)
EOSINOPHILS ABSOLUTE COUNT: 0.1 10*9/L (ref 0.0–0.4)
EOSINOPHILS RELATIVE PERCENT: 0.4 %
EOSINOPHILS RELATIVE PERCENT: 0.5 %
HEMATOCRIT: 43 % (ref 36.0–46.0)
HEMATOCRIT: 43.5 % (ref 36.0–46.0)
HEMOGLOBIN: 13.3 g/dL (ref 12.0–16.0)
HEMOGLOBIN: 13.2 g/dL (ref 12.0–16.0)
LARGE UNSTAINED CELLS: 1 % (ref 0–4)
LARGE UNSTAINED CELLS: 1 % (ref 0–4)
LYMPHOCYTES ABSOLUTE COUNT: 0.2 10*9/L — ABNORMAL LOW (ref 1.5–5.0)
LYMPHOCYTES RELATIVE PERCENT: 2.2 %
LYMPHOCYTES RELATIVE PERCENT: 1.7 %
MEAN CORPUSCULAR HEMOGLOBIN CONC: 31 g/dL (ref 31.0–37.0)
MEAN CORPUSCULAR HEMOGLOBIN CONC: 30.2 g/dL — ABNORMAL LOW (ref 31.0–37.0)
MEAN CORPUSCULAR HEMOGLOBIN: 29.2 pg (ref 26.0–34.0)
MEAN CORPUSCULAR HEMOGLOBIN: 29 pg (ref 26.0–34.0)
MEAN CORPUSCULAR VOLUME: 94.4 fL (ref 80.0–100.0)
MEAN PLATELET VOLUME: 9.9 fL (ref 7.0–10.0)
MEAN PLATELET VOLUME: 8.9 fL (ref 7.0–10.0)
MONOCYTES ABSOLUTE COUNT: 0.3 10*9/L (ref 0.2–0.8)
MONOCYTES ABSOLUTE COUNT: 0.4 10*9/L (ref 0.2–0.8)
MONOCYTES RELATIVE PERCENT: 3.3 %
MONOCYTES RELATIVE PERCENT: 3.7 %
NEUTROPHILS ABSOLUTE COUNT: 8.9 10*9/L — ABNORMAL HIGH (ref 2.0–7.5)
NEUTROPHILS ABSOLUTE COUNT: 8.9 10*9/L — ABNORMAL HIGH (ref 2.0–7.5)
NEUTROPHILS RELATIVE PERCENT: 92.9 %
PLATELET COUNT: 348 10*9/L (ref 150–440)
PLATELET COUNT: 320 10*9/L (ref 150–440)
RED BLOOD CELL COUNT: 4.56 10*12/L (ref 4.00–5.20)
RED BLOOD CELL COUNT: 4.54 10*12/L (ref 4.00–5.20)
RED CELL DISTRIBUTION WIDTH: 19.4 % — ABNORMAL HIGH (ref 12.0–15.0)
RED CELL DISTRIBUTION WIDTH: 18.7 % — ABNORMAL HIGH (ref 12.0–15.0)

## 2018-01-19 LAB — TACROLIMUS, TROUGH: Lab: 20.1

## 2018-01-19 LAB — FSAB CLASS 1 ANTIBODY SPECIFICITY: CPRA%: 0

## 2018-01-19 LAB — COMPREHENSIVE METABOLIC PANEL
ALT (SGPT): 36 U/L (ref 15–48)
ANION GAP: 13 mmol/L (ref 9–15)
AST (SGOT): 42 U/L — ABNORMAL HIGH (ref 14–38)
BILIRUBIN TOTAL: 0.6 mg/dL (ref 0.0–1.2)
BLOOD UREA NITROGEN: 40 mg/dL — ABNORMAL HIGH (ref 7–21)
BUN / CREAT RATIO: 24
CALCIUM: 10.9 mg/dL — ABNORMAL HIGH (ref 8.5–10.2)
CHLORIDE: 104 mmol/L (ref 98–107)
CO2: 24 mmol/L (ref 22.0–30.0)
CREATININE: 1.68 mg/dL — ABNORMAL HIGH (ref 0.60–1.00)
EGFR MDRD AF AMER: 38 mL/min/{1.73_m2} — ABNORMAL LOW (ref >=60)
EGFR MDRD NON AF AMER: 31 mL/min/{1.73_m2} — ABNORMAL LOW (ref >=60)
GLUCOSE RANDOM: 114 mg/dL (ref 65–179)
POTASSIUM: 5.5 mmol/L — ABNORMAL HIGH (ref 3.5–5.0)
PROTEIN TOTAL: 8.5 g/dL — ABNORMAL HIGH (ref 6.5–8.3)
SODIUM: 141 mmol/L (ref 135–145)

## 2018-01-19 LAB — URINALYSIS WITH CULTURE REFLEX
BILIRUBIN UA: NEGATIVE
BLOOD UA: NEGATIVE
GLUCOSE UA: NEGATIVE
KETONES UA: 20 — AB
PH UA: 5.5 (ref 5.0–9.0)
PROTEIN UA: 30 — AB
RBC UA: 5 /HPF — ABNORMAL HIGH (ref <=4)
SPECIFIC GRAVITY UA: 1.02 (ref 1.003–1.030)
SQUAMOUS EPITHELIAL: 2 /HPF (ref 0–5)
WBC UA: 12 /HPF — ABNORMAL HIGH (ref 0–5)

## 2018-01-19 LAB — BASIC METABOLIC PANEL
ANION GAP: 14 mmol/L (ref 9–15)
BUN / CREAT RATIO: 24
CALCIUM: 11.1 mg/dL — ABNORMAL HIGH (ref 8.5–10.2)
CREATININE: 1.61 mg/dL — ABNORMAL HIGH (ref 0.60–1.00)
EGFR MDRD AF AMER: 40 mL/min/{1.73_m2} — ABNORMAL LOW (ref >=60)
EGFR MDRD NON AF AMER: 33 mL/min/{1.73_m2} — ABNORMAL LOW (ref >=60)
GLUCOSE RANDOM: 93 mg/dL (ref 65–179)
POTASSIUM: 6.4 mmol/L (ref 3.5–5.0)
SODIUM: 140 mmol/L (ref 135–145)

## 2018-01-19 LAB — MAGNESIUM: Magnesium:MCnc:Pt:Ser/Plas:Qn:: 1.7

## 2018-01-19 LAB — TROPONIN I
TROPONIN I: 0.065 ng/mL (ref <0.034)
Troponin I.cardiac:MCnc:Pt:Ser/Plas:Qn:: 0.065
Troponin I.cardiac:MCnc:Pt:Ser/Plas:Qn:: 0.07

## 2018-01-19 LAB — CREATININE: Creatinine:MCnc:Pt:Ser/Plas:Qn:: 1.61 — ABNORMAL HIGH

## 2018-01-19 LAB — BASOPHILS ABSOLUTE COUNT: Lab: 0

## 2018-01-19 LAB — LACTATE BLOOD VENOUS: Lactate:SCnc:Pt:BldV:Qn:: 1.7

## 2018-01-19 LAB — WBC ADJUSTED: Lab: 9.6

## 2018-01-19 LAB — LIPASE: Triacylglycerol lipase:CCnc:Pt:Ser/Plas:Qn:: 37 — ABNORMAL LOW

## 2018-01-19 LAB — ALBUMIN: Albumin:MCnc:Pt:Ser/Plas:Qn:: 4.5

## 2018-01-19 LAB — AST (SGOT): Aspartate aminotransferase:CCnc:Pt:Ser/Plas:Qn:: 42 — ABNORMAL HIGH

## 2018-01-19 LAB — PHOSPHORUS: Phosphate:MCnc:Pt:Ser/Plas:Qn:: 3.4

## 2018-01-19 LAB — UROBILINOGEN UA: Lab: 0.2

## 2018-01-19 LAB — HLA CL1 ANTIBODY COMM: Lab: 0

## 2018-01-19 LAB — URINE CULTURE: Culture: <10000 — AB

## 2018-01-19 NOTE — Unmapped (Signed)
Report given, care transferred to [facility], RN.

## 2018-01-19 NOTE — Unmapped (Signed)
Patient transported to X-ray  Transported by Radiology  How tranported Stretcher  Cardiac Monitor yes

## 2018-01-19 NOTE — Unmapped (Addendum)
Enema performed. Assisted pt to bedside commode. Pt began having BM. MD notified.

## 2018-01-19 NOTE — Unmapped (Signed)
Pt BIB EMS, abd pain, N/V. Recent kidney transplant. Seen at OSH for same.

## 2018-01-19 NOTE — Unmapped (Signed)
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 Recovered well. Completed her course of ciprofloxacin for donor UTI. Presenting with lower abdominal gas pain and nausea and burning in her chest. Work up does not show any evidence of infection, except for a small of leukocyte esterase in her urine. WBC is normal and renal US and clinical exam is reassuring. Creatinine is also at baseline from when she left the hospital. KUB shows a moderate stool burden. Presentation consistent with constipation and worsening of GERD symptoms, given that she has been off of PPI as well. Upon evaluation, she reports much improvement in her symptoms since the GI cocktail and would like to try eating again.     PLAN:   -EKG and troponin given nausea and chest burning. Has been mildly elevated in the past, so please repeat and trend if positive.   -Recommend suppository and PO challenge after GI cocktail. Please give 40 mg PPI IV as well.   -Plan for follow up appointment tomorrow and pharmacy visit. Will discuss GERD management at that time and likely pull her subcutaneous drain. Will also follow up her urine culture.   -Okay for discharge after suppository as long as she remains clinically unchanged and has a bowel movement.   -We told Ms. Corro to hold her cellcept tonight to see if her symptoms improve as well.     If you have any questions, concerns or changes in the patient's clinical status, please feel free to contact [facility] consult pager [phone]. Patient seen and discussed with Dr. Raelene Bott.     Cerise Lieber T. Loura Pardon, MD  [facility]  General Surgery, PGY3    History of Present Illness:   Chief Complaint:  Vomiting    Lindell Tussey is a 57 y.o. female who is seen in consultation for nausea/vomiting at the request of Merrie Roof, MD on the Emergency Medicine service.     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  Postoperative course was complicated by hypertension, however resolved once all home antihypertensives were resumed and adjusted per Nephrology. She received aggressive pulmonary toliet postop and was down to 2L Rowena (her baseline) by POD 2.. Perinephric drain was removed on POD 4. Subcutaneous drain remained in place at discharge as output was >30 cc/24 hours.    On 01/09/18, donor cultures resulted positive for MSSA and enterobacter in lungs. She was initiated on a 7 day course of PO ciprofloxacin, renally dosed, to end on 01/16/18. Patient was stable throughout her hospital stay, showed no signs of infection or bacteremia.    She presents today with nausea and lower abdominal pain for the past three days. She had a CT scan at OSH that did not show any evidence of a perinephric fluid collection. Labs today are unremarkable. Creatinine is 1.6. WBC is normal. She clinically appears well with stable vitals. Denies diarrhea. Last bowel movement was on Sunday (4 days ago). Reports that insurance doesn't pay for her reflux medicine so she hasn't been taking it and reports this is making her feel worse. She denies fevers or incisional drainage. JP drain has been serous and putting out <30 cc/day. Reports she is making a lot of urine.  Past Medical History:   Past Medical History:   Diagnosis Date   ??? Abnormal mammogram    ??? Asthma    ??? Chronic kidney disease    ??? COPD (chronic obstructive pulmonary disease) (CMS-HCC)    ??? GERD (gastroesophageal reflux disease)    ??? Hypertension    ??? Migraine    ??? Nephrotic syndrome        Past Surgical History:  Past Surgical History:   Procedure Laterality Date   ??? ABDOMINAL SURGERY     ??? APPENDECTOMY     ??? COLON SURGERY      bowel resection   ??? COLONOSCOPY     ??? HERNIA REPAIR     ??? HYSTERECTOMY      partial hysterectomy, cyst on ovary   ??? PR COLSC FLX W/RMVL OF TUMOR POLYP LESION SNARE TQ N/A 12/18/2015    Procedure: COLONOSCOPY FLEX; W/REMOV TUMOR/LES BY SNARE;  Surgeon: Zetta Bills, MD;  [facility];  Service: Gastroenterology   ??? PR TRANSPLANT,PREP RENAL GRAFT/ARTERIAL N/A 01/08/2018    Procedure: [facility] RECONSTRUCTION CADAVER/LIVING DONOR RENAL ALLOGRAFT PRIOR TO TRANSPLANT; ARTERIAL ANASTOMOSIS EAC;  Surgeon: Leona Carry, MD;  Location: MAIN OR [facility];  Service: Transplant   ??? PR TRANSPLANTATION OF KIDNEY N/A 01/08/2018    Procedure: RENAL ALLOTRANSPLANTATION, IMPLANTATION OF GRAFT; WITHOUT RECIPIENT NEPHRECTOMY;  Surgeon: Leona Carry, MD;  Location: MAIN OR [facility];  Service: Transplant   ??? venous shunt         Medications:  No current facility-administered medications on file prior to encounter.      Current Outpatient Medications on File Prior to Encounter   Medication Sig Dispense Refill   ??? acetaminophen (TYLENOL) 500 MG tablet Take 1-2 tablets (500-1,000 mg total) by mouth Three (3) times a day as needed. (Patient taking differently: Take 1 tablet by mouth Three (3) times a day as needed for fever or pain. Take 1 tab (500mg ) 3xday as needed for pain. May increase to 2 tabs (1000mg ) as needed for unrelieved pain.  Indications: Pain) 100 tablet 0   ??? albuterol (PROVENTIL HFA;VENTOLIN HFA) 90 mcg/actuation inhaler Inhale 2 puffs daily as needed for shortness of breath.      ??? amLODIPine (NORVASC) 10 MG tablet Take 1 tablet (10 mg total) by mouth daily. Use only as directed. 30 tablet 11   ??? aspirin (ECOTRIN) 81 MG tablet Take 81 mg by mouth daily.     ??? calcium carbonate (TUMS) 200 mg calcium (500 mg) chewable tablet Chew 2 tablets (400 mg of elem calcium total) Three (3) times a day as needed.  0   ??? [EXPIRED] ciprofloxacin HCl (CIPRO) 500 MG tablet Take 1 tablet (500 mg total) by mouth every twelve (12) hours. for 4 days 8 tablet 0   ??? cloNIDine HCl (CATAPRES) 0.1 MG tablet Take 0.1 mg by mouth Two (2) times a day.      ??? docusate sodium (COLACE) 100 MG capsule Take 1 capsule (100 mg total) by mouth two (2) times a day as needed for constipation. 60 capsule 0   ??? gabapentin (NEURONTIN) 300 MG capsule Take 300 mg by mouth nightly.     ??? hydrALAZINE (APRESOLINE) 25 MG tablet Take 2 tablets (50 mg total) by mouth Three (3) times a day. 180 tablet 0   ??? leg brace (KNEE BRACE) Misc 1 brace for left knee 1 each 0   ??? magnesium oxide-Mg AA chelate (MAGNESIUM, AMINO ACID CHELATE,) 133 mg  Tab Take 1 tablet by mouth Two (2) times a day. HOLD until directed to start by your coordinator. 60 tablet 11   ??? metoprolol tartrate (LOPRESSOR) 25 MG tablet Take 3 tablets (75 mg total) by mouth Two (2) times a day. 180 tablet 11   ??? MYFORTIC 180 mg EC tablet Take 3 tablets (540 mg total) by mouth Two (2) times a day. 180 tablet 11   ??? omeprazole (PRILOSEC) 40 MG capsule Take 1 capsule (40 mg total) by mouth daily. 90 capsule 3   ??? oxyCODONE-acetaminophen (PERCOCET) 10-325 mg per tablet HOLD. Do not take while on Tramadol. 30 tablet 0   ??? polyethylene glycol (MIRALAX) 17 gram packet Take 17 g by mouth daily as needed. 30 each 0   ??? PROGRAF 1 mg capsule Take 8 capsules (8 mg total) by mouth two (2) times a day. 480 capsule 11   ??? rosuvastatin (CRESTOR) 5 MG tablet Take 5 mg by mouth daily.     ??? sulfamethoxazole-trimethoprim (BACTRIM,SEPTRA) 400-80 mg per tablet Take 1 tablet (80 mg of trimethoprim total) by mouth 3 (three) times a week. 12 tablet 5   ??? [EXPIRED] traMADol (ULTRAM) 50 mg tablet Take 1 tablet (50 mg total) by mouth every eight (8) hours as needed for other (pain). for up to 5 days 30 tablet 0   ??? valGANciclovir (VALCYTE) 450 mg tablet Take 1 tablet (450 mg total) by mouth every other day. 30 tablet 2       Allergies:  Allergies   Allergen Reactions   ??? Buprenorphine Hcl Nausea And Vomiting   ??? Codeine Nausea And Vomiting   ??? Hydrocodone-Acetaminophen Nausea And Vomiting   ??? Morphine Nausea And Vomiting     Halllucinations       Family History:  Family History   Problem Relation Age of Onset   ??? No Known Problems Mother    ??? No Known Problems Father    ??? No Known Problems Sister    ??? No Known Problems Daughter    ??? No Known Problems Maternal Grandmother    ??? No Known Problems Maternal Grandfather    ??? No Known Problems Paternal Grandmother    ??? No Known Problems Paternal Grandfather    ??? BRCA 1/2 Neg Hx    ??? Breast cancer Neg Hx    ??? Cancer Neg Hx    ??? Colon cancer Neg Hx    ??? Endometrial cancer Neg Hx    ??? Ovarian cancer Neg Hx        Social History:   Social History     Tobacco Use   ??? Smoking status: Former Smoker     Packs/day: 2.00     Years: 36.00     Pack years: 72.00     Types: Cigarettes     Start date: 10/24/1975     Last attempt to quit: 10/24/2011     Years since quitting: 6.2   ??? Smokeless tobacco: Never Used   Substance Use Topics   ??? Alcohol use: No     Alcohol/week: 0.0 oz     Comment: quit alcohol use at age 24   ??? Drug use: No       Review of Systems  10 systems were reviewed and are negative except as noted specifically in the HPI.    Objective  Vitals:   Temp:  [35.6 ??C-36.7 ??C] 36.7 ??C  Heart Rate:  [78-92] 87  SpO2 Pulse:  [85-86] 86  Resp:  [14-20] 20  BP: (118-135)/(80-108) 135/81  MAP (mmHg):  [96] 96  SpO2:  [97 %-99 %] 98 %      Intake/Output last 24 hours:  No intake or output data in the 24 hours ending 01/19/18 1505      Physical Exam:   CONSTITUTIONAL: No acute distress. Well-appearing consistent with stated age.   EYES: Anicteric. Extraocular movements are grossly intact.   EAR, NOSE, MOUTH AND THROAT: Mucous membranes moist. Normal dentition. Neck supple. Trachea midline. No JVD.  HEART/CARDIOVASCULAR: Normal sinus rhythm.  CHEST/PULMONARY: Clear to auscultation bilaterally. Breathing comfortably on room air.   ABDOMEN/GASTROINTESTINAL: Soft, Minimally tender across lower abdomen. RLQ incision is c/d/i with staples. JP drain in place with serous output.   MUSCULOSKELETAL: Moves all 4 extremities spontaneously. Full range of motion.   SKIN: Warm. Well perfused without cyanosis or edema.   NEUROLOGIC: Alert and oriented x3. Sensory and motor grossly intact.      Pertinent Diagnostic Tests:  All lab results last 24 hours:    Recent Results (from the past 24 hour(s))   Basic Metabolic Panel    Collection Time: 01/19/18  8:35 AM   Result Value Ref Range    Sodium 140 135 - 145 mmol/L    Potassium 6.4 (HH) 3.5 - 5.0 mmol/L    Chloride 106 98 - 107 mmol/L    CO2 20.0 (L) 22.0 - 30.0 mmol/L    BUN 39 (H) 7 - 21 mg/dL    Creatinine 1.61 (H) 0.60 - 1.00 mg/dL    BUN/Creatinine Ratio 24     EGFR MDRD Non Af Amer 33 (L) >=60 mL/min/1.10m2    EGFR MDRD Af Amer 40 (L) >=60 mL/min/1.77m2    Anion Gap 14 9 - 15 mmol/L    Glucose 93 65 - 179 mg/dL    Calcium 09.6 (H) 8.5 - 10.2 mg/dL   Albumin    Collection Time: 01/19/18  8:35 AM   Result Value Ref Range    Albumin 4.5 3.5 - 5.0 g/dL   Magnesium Level    Collection Time: 01/19/18  8:35 AM   Result Value Ref Range    Magnesium 1.7 1.6 - 2.2 mg/dL   Phosphorus Level    Collection Time: 01/19/18  8:35 AM   Result Value Ref Range    Phosphorus 3.4 2.9 - 4.7 mg/dL   CBC w/ Differential    Collection Time: 01/19/18  8:35 AM   Result Value Ref Range    WBC 9.6 4.5 - 11.0 10*9/L    RBC 4.56 4.00 - 5.20 10*12/L    HGB 13.3 12.0 - 16.0 g/dL    HCT 04.5 40.9 - 81.1 %    MCV 94.4 80.0 - 100.0 fL    MCH 29.2 26.0 - 34.0 pg    MCHC 31.0 31.0 - 37.0 g/dL    RDW 91.4 (H) 78.2 - 15.0 %    MPV 9.9 7.0 - 10.0 fL    Platelet 348 150 - 440 10*9/L    Neutrophils % 92.7 %    Lymphocytes % 2.2 %    Monocytes % 3.3 %    Eosinophils % 0.4 %    Basophils % 0.4 %    Neutrophil Left Shift 1+ (A) Not Present    Absolute Neutrophils 8.9 (H) 2.0 - 7.5 10*9/L    Absolute Lymphocytes 0.2 (L) 1.5 - 5.0 10*9/L    Absolute Monocytes 0.3 0.2 - 0.8 10*9/L    Absolute Eosinophils 0.0  0.0 - 0.4 10*9/L Absolute Basophils 0.0 0.0 - 0.1 10*9/L    Large Unstained Cells 1 0 - 4 %    Microcytosis Slight (A) Not Present    Macrocytosis Moderate (A) Not Present    Anisocytosis Moderate (A) Not Present    Hypochromasia Marked (A) Not Present   Comprehensive metabolic panel    Collection Time: 01/19/18 11:10 AM   Result Value Ref Range    Sodium 141 135 - 145 mmol/L    Potassium 5.5 (H) 3.5 - 5.0 mmol/L    Chloride 104 98 - 107 mmol/L    CO2 24.0 22.0 - 30.0 mmol/L    BUN 40 (H) 7 - 21 mg/dL    Creatinine 1.61 (H) 0.60 - 1.00 mg/dL    BUN/Creatinine Ratio 24     EGFR MDRD Non Af Amer 31 (L) >=60 mL/min/1.84m2    EGFR MDRD Af Amer 38 (L) >=60 mL/min/1.70m2    Anion Gap 13 9 - 15 mmol/L    Glucose 114 65 - 179 mg/dL    Calcium 09.6 (H) 8.5 - 10.2 mg/dL    Albumin 4.6 3.5 - 5.0 g/dL    Total Protein 8.5 (H) 6.5 - 8.3 g/dL    Total Bilirubin 0.6 0.0 - 1.2 mg/dL    AST 42 (H) 14 - 38 U/L    ALT 36 15 - 48 U/L    Alkaline Phosphatase 116 38 - 126 U/L   Lipase    Collection Time: 01/19/18 11:10 AM   Result Value Ref Range    Lipase 37 (L) 44 - 232 U/L   CBC w/ Differential    Collection Time: 01/19/18 11:10 AM   Result Value Ref Range    WBC 9.6 4.5 - 11.0 10*9/L    RBC 4.54 4.00 - 5.20 10*12/L    HGB 13.2 12.0 - 16.0 g/dL    HCT 04.5 40.9 - 81.1 %    MCV 95.9 80.0 - 100.0 fL    MCH 29.0 26.0 - 34.0 pg    MCHC 30.2 (L) 31.0 - 37.0 g/dL    RDW 91.4 (H) 78.2 - 15.0 %    MPV 8.9 7.0 - 10.0 fL    Platelet 320 150 - 440 10*9/L    Neutrophils % 92.9 %    Lymphocytes % 1.7 %    Monocytes % 3.7 %    Eosinophils % 0.5 %    Basophils % 0.5 %    Absolute Neutrophils 8.9 (H) 2.0 - 7.5 10*9/L    Absolute Lymphocytes 0.2 (L) 1.5 - 5.0 10*9/L    Absolute Monocytes 0.4 0.2 - 0.8 10*9/L    Absolute Eosinophils 0.1 0.0 - 0.4 10*9/L    Absolute Basophils 0.1 0.0 - 0.1 10*9/L    Large Unstained Cells 1 0 - 4 %    Microcytosis Slight (A) Not Present    Macrocytosis Moderate (A) Not Present    Anisocytosis Moderate (A) Not Present    Hypochromasia Marked (A) Not Present   Lactic Acid, Venous, Whole Blood    Collection Time: 01/19/18 11:10 AM   Result Value Ref Range    Lactate, Venous 1.7 0.5 - 1.8 mmol/L   Urinalysis with Culture Reflex    Collection Time: 01/19/18 11:30 AM   Result Value Ref Range    Color, UA Yellow     Clarity, UA Hazy     Specific Gravity, UA 1.020 1.003 - 1.030    pH, UA 5.5 5.0 - 9.0  Leukocyte Esterase, UA Small (A) Negative    Nitrite, UA Negative Negative    Protein, UA 30 mg/dL (A) Negative    Glucose, UA Negative Negative    Ketones, UA 20 mg/dL (A) Negative    Urobilinogen, UA 0.2 mg/dL 0.2 mg/dL, 1.0 mg/dL    Bilirubin, UA Negative Negative    Blood, UA Negative Negative    RBC, UA 5 (H) <=4 /HPF    WBC, UA 12 (H) 0 - 5 /HPF    Squam Epithel, UA 2 0 - 5 /HPF    Bacteria, UA Rare (A) None Seen /HPF    Mucus, UA Rare (A) None Seen /HPF       Imaging:  US Renal Transplant W Doppler    Result Date: 01/19/2018  EXAM: US RENAL TRANSPLANT W DOPPLER DATE: 01/19/2018 12:39 PM ACCESSION: [id number] UN DICTATED: 01/19/2018 12:40 PM INTERPRETATION LOCATION: [facility] CLINICAL INDICATION: 57 years old Female with s/p kidney transplant now with abdominal pain COMPARISON: Renal transplant ultrasound 01/08/2018. TECHNIQUE:  Ultrasound views of the renal transplant were obtained using gray scale and color and spectral Doppler imaging. Views of the urinary bladder were obtained using gray scale imaging. FINDINGS: TRANSPLANTED KIDNEY: The renal transplant was located in the right lower quadrant. Normal size and echogenicity.  No solid masses or calculi. No perinephric collections identified. No hydronephrosis. VESSELS: - Perfusion: Using power Doppler, normal perfusion was seen throughout the renal parenchyma. - Resistive indices in the renal transplant are stable compared with prior examination. - Main renal artery/iliac artery: Patent - Main renal vein/iliac vein: Patent BLADDER: Unremarkable.     --Right lower quadrant transplant kidney with patent vasculature. Resistive indices are overall stable to slightly decreased from prior and within normal limits. Please see below for data measurements: RLQ kidney: length 9.77 cm; width 4.84 cm; height 5.68 cm Arcuate artery superior resistive indices: 0.74 (0.65) Arcuate artery mid resistive indices: 0.71 (0.75) Arcuate artery inferior resistive indices: 0.70 (0.72) Segmental artery superior resistive indices: 0.71 (0.77) Segmental artery mid resistive indices: 0.69 (0.70) Segmental artery inferior resistive indices: 0.74 (0.78) Main renal artery resistive indices: 0.77 - 0.84 (0.77-0.84) Main renal vein: Patent Iliac artery: Patent Iliac vein: Patent Bladder volume: 3.86 ml     Xr Abdomen 3 Views Includes 1 View Chest    Result Date: 01/19/2018  EXAM: XR ABDOMEN 3 VIEWS INCLUDES 1 VIEW CHEST DATE: 01/19/2018 12:03 PM ACCESSION: [id number] UN DICTATED: 01/19/2018 1:26 PM INTERPRETATION LOCATION: [facility] CLINICAL INDICATION: 57 year old female with suprapubic pain. No recent BM but recent surgery.  COMPARISON: 01/07/2018 chest radiographs TECHNIQUE: PA view of the chest as well as upright and supine views of the abdomen. FINDINGS: Surgical clips overlie the left axilla. The cardiomediastinal silhouette is within normal limits. The lungs are clear. Surgical clips overlie the pelvis. There is a surgical drain overlying the pelvis. Additionally, there is a catheter that originates the right pelvis and extends over the bladder, which likely represents a transplant kidney ureteral stent. There are surgical clips in the right pelvis. Air is noted in multiple non-dilated loops of bowel. There is moderate colonic stool burden. No acute osseous abnormality.     - Non-obstructive bowel gas pattern with moderate colonic stool burden.

## 2018-01-19 NOTE — Unmapped (Signed)
I received patient care from Dr. Noralee Stain at shift change. I reviewed the patient records and evaluated the patient on my arrival.    Vitals:    01/19/18 1047   BP: 120/108   Pulse: 78   Resp: 15   Temp: 36.7 ??C (98.1 ??F)   SpO2: 97%       ED I-PASS Handoff  ?? Illness Severit: stable  ?? Patient Summary: Danielle Parker is a 57 y.o. female with recent renal allotransplantation (01/08/18, now on Cellcept 750 mg BIDand prograf 8 mg BID) 2/2 ESRD c/b donor bacteremia with positive MSSA cultures and enterobacter in lungs (7d course PO cipro to end on 6/9) presenting to the ED via EMS for persistent abdominal pain, nausea and vomiting since d/c. Patient has also recently been seen at [facility] x2 (6/10 6/11) with reassuring CT Abdomen and Renal US at that time.   ?? Action List: SRF requested repeat XR abdomen 3 view, and renal US w/ doppler, as we are unable to see the OSH images on Care Everywhere. Await SRF recs  ?? Situation Awareness (Contingency Planning): Anticipate discharge. If imaging is reassuring after SRF consult, then discharge w/ f/u to [facility] service.   ?? Synthesis by Receiver    Assessment and Plan: 1) Abdominal Pain, 2) Nonintractable N/V, 3) s/p Renal Transplant    2:01 PM Called radiology in attempt to result the study, they state they are attempting to find the tech you did the scan before they can result the study.    2:24 PM Paged SRF.     2:43 PM SRF saw the patient and feel comfortable with discharge the patient after she had a BM. Will given enema and PO challenge afterwards. If still having pain, will reassess plan with transplant team. She has a f/u appointment tomorrow with SRF already scheduled.     3:43 PM SRF evaluated the patient and added on a troponin and EKG due to her nausea.  Her troponin did come back elevated at 0.070.  At baseline the patient does have elevated troponins and she is a renal transplant patient.  She is not having any chest pain.  Her EKG shows normal sinus rhythm, normal axis, atrial enlargement, mild ST depression in anterolateral leads which is not new as compared to her most previous EKG.  Her T wave inversions have since resolved.  No evidence of STEMI.    4:02 PM Per RN report, patient with BM, but now with worse abdominal pain and nausea. Paged SRF for plan/recs.     4:10 PM Spoke to transplant surgery.  They will come down and reevaluate the patient to discuss admission versus discharge home.  We did discuss her elevated troponin and the need for repeat in 2 hours to ensure is not increasing. They will come back to reassess.    4:42 PM SRF saw and reevaluated the patient. They are comfortable with discharge after 40 IV protonix and repeat troponin now. Still with plan to f/u tomorrow.     Disclaimer: This note was created using Data processing manager and may contain occasional errors in spelling, grammar, and erroneous words. Such errors have been sought, but not all may have been identified and corrected.     Documentation assistance was provided by Orville Govern, Scribe on January 19, 2018 at 11:40 AM for Leda Gauze, MD.     Documentation assistance was provided by the scribe in my presence.  The documentation recorded by the scribe has been  reviewed by me and accurately reflects the services I personally performed.

## 2018-01-19 NOTE — Unmapped (Signed)
[facility] Emergency Department Provider Note    ED Clinical Impression     Final diagnoses:   Abdominal pain, unspecified abdominal location (Primary)   Kidney replaced by transplant         Initial Impression, ED Course, Assessment and Plan     Abdominal Pain  -Abdomen is soft with no tenderness palpation of my exam.  With no bowel movement since Sunday obstruction in differential but given her nondistended soft abdomen I find this less likely.  UTI also possible.  Will obtain a urine to further evaluate.  Will obtain basic lab work as well.  Wounds all appear to be healing appropriately with incisions intact with no surrounding erythema making infection less likely.  Similarly she had a CT scan performed at her first ER visit at [facility] which was overall unremarkable.    S/p Kidney Transplant currently POD #11  -Will consult transplant surgery for evaluation      Patient signed out awaiting xrays, U/S, and transplant surgery evaluation.          ____________________________________________    Time seen: January 19, 2018 11:37 AM       History     Chief Complaint  Abdominal Pain      HPI   Danielle Parker is a 57 y.o. 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  She reports that she was doing well postoperatively until Sunday when she began developing severe lower abdominal pain and vomiting.  She states that she presented to [facility] where they treat her symptomatically, obtained a CT scan which was unremarkable, and then sent her home.  She states that the pain and vomiting continued so she represented the next day with similar treatment.  She states her symptoms of gotten no better and so this time she asked EMS to transport to [facility] for further evaluation.  The patient denies any fevers.  She states she is making a lot of urine.  She states she has not had any flatus in the past 24 hours with her last bowel movement on Sunday.  She states she is taking Colace but not trying any laxatives.  She denies any blood in her emesis.  She states her last emesis was this morning after he she attempted eating toast.              Past Medical History:   Diagnosis Date   ??? Abnormal mammogram    ??? Asthma    ??? Chronic kidney disease    ??? COPD (chronic obstructive pulmonary disease) (CMS-HCC)    ??? GERD (gastroesophageal reflux disease)    ??? Hypertension    ??? Migraine    ??? Nephrotic syndrome        Past Surgical History:   Procedure Laterality Date   ??? ABDOMINAL SURGERY     ??? APPENDECTOMY     ??? COLON SURGERY      bowel resection   ??? COLONOSCOPY     ??? HERNIA REPAIR     ??? HYSTERECTOMY      partial hysterectomy, cyst on ovary   ??? PR COLSC FLX W/RMVL OF TUMOR POLYP LESION SNARE TQ N/A 12/18/2015    Procedure: COLONOSCOPY FLEX; W/REMOV TUMOR/LES BY SNARE;  Surgeon: Zetta Bills, MD;  Location: GI  PROCEDURES MEMORIAL [facility];  Service: Gastroenterology   ??? PR TRANSPLANT,PREP RENAL GRAFT/ARTERIAL N/A 01/08/2018    Procedure: [facility] RECONSTRUCTION CADAVER/LIVING DONOR RENAL ALLOGRAFT PRIOR TO TRANSPLANT; ARTERIAL ANASTOMOSIS EAC;  Surgeon: Leona Carry, MD;  Location: MAIN OR [facility];  Service: Transplant   ??? PR TRANSPLANTATION OF KIDNEY N/A 01/08/2018    Procedure: RENAL ALLOTRANSPLANTATION, IMPLANTATION OF GRAFT; WITHOUT RECIPIENT NEPHRECTOMY;  Surgeon: Leona Carry, MD;  Location: MAIN OR [facility];  Service: Transplant   ??? venous shunt           Current Facility-Administered Medications:   ???  fentaNYL (PF) (SUBLIMAZE) injection 50 mcg, 50 mcg, Intravenous, Once, Merrie Roof, MD    Current Outpatient Medications:   ???  acetaminophen (TYLENOL) 500 MG tablet, Take 1-2 tablets (500-1,000 mg total) by mouth Three (3) times a day as needed. (Patient taking differently: Take 1 tablet by mouth Three (3) times a day as needed for fever or pain. Take 1 tab (500mg ) 3xday as needed for pain. May increase to 2 tabs (1000mg ) as needed for unrelieved pain.  Indications: Pain), Disp: 100 tablet, Rfl: 0  ???  albuterol (PROVENTIL HFA;VENTOLIN HFA) 90 mcg/actuation inhaler, Inhale 2 puffs daily as needed for shortness of breath. , Disp: , Rfl:   ???  amLODIPine (NORVASC) 10 MG tablet, Take 1 tablet (10 mg total) by mouth daily. Use only as directed., Disp: 30 tablet, Rfl: 11  ???  aspirin (ECOTRIN) 81 MG tablet, Take 81 mg by mouth daily., Disp: , Rfl:   ???  calcium carbonate (TUMS) 200 mg calcium (500 mg) chewable tablet, Chew 2 tablets (400 mg of elem calcium total) Three (3) times a day as needed., Disp: , Rfl: 0  ???  cloNIDine HCl (CATAPRES) 0.1 MG tablet, Take 0.1 mg by mouth Two (2) times a day. , Disp: , Rfl:   ???  docusate sodium (COLACE) 100 MG capsule, Take 1 capsule (100 mg total) by mouth two (2) times a day as needed for constipation., Disp: 60 capsule, Rfl: 0  ???  gabapentin (NEURONTIN) 300 MG capsule, Take 300 mg by mouth nightly., Disp: , Rfl:   ???  hydrALAZINE (APRESOLINE) 25 MG tablet, Take 2 tablets (50 mg total) by mouth Three (3) times a day., Disp: 180 tablet, Rfl: 0  ???  leg brace (KNEE BRACE) Misc, 1 brace for left knee, Disp: 1 each, Rfl: 0  ???  magnesium oxide-Mg AA chelate (MAGNESIUM, AMINO ACID CHELATE,) 133 mg Tab, Take 1 tablet by mouth Two (2) times a day. HOLD until directed to start by your coordinator., Disp: 60 tablet, Rfl: 11  ???  metoprolol tartrate (LOPRESSOR) 25 MG tablet, Take 3 tablets (75 mg total) by mouth Two (2) times a day., Disp: 180 tablet, Rfl: 11  ???  MYFORTIC 180 mg EC tablet, Take 3 tablets (540 mg total) by mouth Two (2) times a day., Disp: 180 tablet, Rfl: 11  ???  omeprazole (PRILOSEC) 40 MG capsule, Take 1 capsule (40 mg total) by mouth daily., Disp: 90 capsule, Rfl: 3  ???  oxyCODONE-acetaminophen (PERCOCET) 10-325 mg per tablet, HOLD. Do not take while on Tramadol., Disp: 30 tablet, Rfl: 0  ???  polyethylene glycol (MIRALAX) 17 gram packet, Take 17 g by mouth daily as needed., Disp: 30 each, Rfl: 0  ???  PROGRAF 1 mg capsule, Take 8 capsules (8 mg total) by mouth two (2) times a day., Disp: 480 capsule, Rfl: 11  ???  rosuvastatin (CRESTOR) 5 MG tablet,  Take 5 mg by mouth daily., Disp: , Rfl:   ???  sulfamethoxazole-trimethoprim (BACTRIM,SEPTRA) 400-80 mg per tablet, Take 1 tablet (80 mg of trimethoprim total) by mouth 3 (three) times a week., Disp: 12 tablet, Rfl: 5  ???  valGANciclovir (VALCYTE) 450 mg tablet, Take 1 tablet (450 mg total) by mouth every other day., Disp: 30 tablet, Rfl: 2    Allergies  Buprenorphine hcl; Codeine; Hydrocodone-acetaminophen; and Morphine    Family History   Problem Relation Age of Onset   ??? No Known Problems Mother    ??? No Known Problems Father    ??? No Known Problems Sister    ??? No Known Problems Daughter    ??? No Known Problems Maternal Grandmother    ??? No Known Problems Maternal Grandfather    ??? No Known Problems Paternal Grandmother    ??? No Known Problems Paternal Grandfather    ??? BRCA 1/2 Neg Hx    ??? Breast cancer Neg Hx    ??? Cancer Neg Hx    ??? Colon cancer Neg Hx    ??? Endometrial cancer Neg Hx    ??? Ovarian cancer Neg Hx        Social History  Social History     Tobacco Use   ??? Smoking status: Former Smoker     Packs/day: 2.00     Years: 36.00     Pack years: 72.00     Types: Cigarettes     Start date: 10/24/1975     Last attempt to quit: 10/24/2011     Years since quitting: 6.2   ??? Smokeless tobacco: Never Used   Substance Use Topics   ??? Alcohol use: No     Alcohol/week: 0.0 oz     Comment: quit alcohol use at age 12   ??? Drug use: No       Review of Systems  Constitutional: Negative for fever.  Eyes: Negative for visual changes.  ENT: Negative for sore throat.  Cardiovascular: Negative for chest pain.  Respiratory: Negative for shortness of breath.  Gastrointestinal: +abdominal pain, +vomiting.  Genitourinary: Negative for dysuria.  Musculoskeletal: Negative for back pain.  Skin: Negative for rash.  Neurological: Negative for headaches, weakness or numbness.  All other systems reviewed and otherwise negative       Physical Exam     VITAL SIGNS:    ED Triage Vitals [01/19/18 1047]   Enc Vitals Group      BP 120/108      Heart Rate 78      SpO2 Pulse 85      Resp 15      Temp 36.7 ??C (98.1 ??F)      Temp Source Oral      SpO2 97 %      Weight       Height       Head Circumference       Peak Flow       Pain Score       Pain Loc       Pain Edu?       Excl. in GC?        Constitutional: Alert and oriented. Well appearing and in no distress.  Eyes: Conjunctivae are normal.  ENT       Head: Normocephalic and atraumatic.       Nose: No congestion.       Mouth/Throat: Mucous membranes are moist.  Hematological/Lymphatic/Immunilogical: No lymphadenopathy appreciated.  Cardiovascular: Normal rate,  regular rhythm. Normal and symmetric distal pulses are present in all extremities.  Respiratory: Clear to auscultation bilaterally with no wheezes/rales/rhonchi appreciated.    Gastrointestinal: Incision intact with staples along right lower abdomen with no surrounding erythema or drainage.  Abdomen is soft and nondistended with no significant TTP with no rebound or guarding.  Drain present with small amount of serosanguinous drainage present.    Musculoskeletal: Nontender with normal range of motion in all extremities.  Back:  No CVA TTP.    Neurologic: Normal speech and language. No gross focal neurologic deficits are appreciated.  Skin: Skin is warm, dry and intact. No rash noted.  Psychiatric: Mood and affect are normal. Speech and behavior are normal.          Radiology     Pending Abdominal Series and Renal Transplant U/S      I reviewed the patient's prior medical records.   I discussed the case with the Transplant Surgery consultant.           Pertinent labs & imaging results that were available during my care of the patient were reviewed by me and considered in my medical decision making (see chart for details).       Merrie Roof, MD  01/19/18 1144

## 2018-01-19 NOTE — Unmapped (Signed)
Received page from [facility].  Spoke with Tressa Busman.  She stated patient is coming to [facility] via EMS.  For the past 2 days patient has had increased N/V and pain.  She has a fever of about 100 and has been to local ER x2 in the past few days [facility]).  Jill Side was able to convince [facility] to bring patient to [facility] ER due to being 11 days post transplant.

## 2018-01-19 NOTE — Unmapped (Signed)
Assessment completed. Somewhat ill-appearing female with PMH of recent R kidney transplant on 01/08/18 (11 days ago) presenting to the ED for eval of severe abdominal pain, N/V x2 days. Pt reports her symptoms began suddenly without any inciting factors. Was seen at [facility] for her symptoms yesterday and was discharged to home. Symptoms have persisted without improvement since that time. Exam significant for post-surgical JP drain to R lower abdomen with small amount of serosanguinous fluid. Abdomen is otherwise currently soft and nontender throughout. Heart with regular rate and rhythm. Lungs with bilateral expiratory wheezes. Bed locked and in lowest position. Call bell and pt belongings within reach.

## 2018-01-19 NOTE — Unmapped (Signed)
MD at bedside. 

## 2018-01-19 NOTE — Unmapped (Signed)
Pt up to use bedside commode with assist.

## 2018-01-19 NOTE — Unmapped (Signed)
Bed: 01-B  Expected date:   Expected time:   Means of arrival:   Comments:  ems

## 2018-01-19 NOTE — Unmapped (Signed)
Notified ED resident of difficulty obtaining second set of blood cultures. Resident paged transplant team to determine need. Waiting for reply.

## 2018-01-19 NOTE — Unmapped (Signed)
Patient returned from Ultrasound  Transported by Radiology  How tranported Stretcher  Cardiac Monitor yes

## 2018-01-19 NOTE — Unmapped (Signed)
Pt is resting on stretcher. Stretcher is in the low and locked position. Needs including pain and toileting addressed. Pt belongings and call bell at bedside within reach. Respirations even and unlabored. Will continue to monitor pt for any signs of distress.

## 2018-01-19 NOTE — Unmapped (Signed)
Pt resting with eyes closed.Pt remains unchanged since last assessment.bed in low and locked position. Call bell and belongings at bedside and within reach. Pt in NAD.  Pt is A&O x4 and resting on stretcher.

## 2018-01-20 ENCOUNTER — Encounter: Admit: 2018-01-20 | Discharge: 2018-01-21 | Payer: 59

## 2018-01-20 ENCOUNTER — Non-Acute Institutional Stay: Admit: 2018-01-20 | Discharge: 2018-01-21 | Payer: 59

## 2018-01-20 ENCOUNTER — Encounter
Admit: 2018-01-20 | Discharge: 2018-01-21 | Payer: 59 | Attending: Pharmacist Clinician (PhC)/ Clinical Pharmacy Specialist | Primary: Pharmacist Clinician (PhC)/ Clinical Pharmacy Specialist

## 2018-01-20 DIAGNOSIS — Z4822 Encounter for aftercare following kidney transplant: Secondary | ICD-10-CM

## 2018-01-20 DIAGNOSIS — R9389 Abnormal findings on diagnostic imaging of other specified body structures: Principal | ICD-10-CM

## 2018-01-20 DIAGNOSIS — R1010 Upper abdominal pain, unspecified: Principal | ICD-10-CM

## 2018-01-20 DIAGNOSIS — Z940 Kidney transplant status: Principal | ICD-10-CM

## 2018-01-20 DIAGNOSIS — R109 Unspecified abdominal pain: Secondary | ICD-10-CM

## 2018-01-20 DIAGNOSIS — Z9889 Other specified postprocedural states: Secondary | ICD-10-CM

## 2018-01-20 MED ORDER — PROGRAF 1 MG CAPSULE
ORAL_CAPSULE | 11 refills | 0 days
Start: 2018-01-20 — End: 2018-01-20

## 2018-01-20 MED ORDER — OMEPRAZOLE 40 MG CAPSULE,DELAYED RELEASE
ORAL_CAPSULE | Freq: Every day | ORAL | 3 refills | 0.00000 days | Status: CP
Start: 2018-01-20 — End: 2018-01-20

## 2018-01-20 MED ORDER — OMEPRAZOLE 40 MG CAPSULE,DELAYED RELEASE: 40 mg | capsule | 3 refills | 0 days

## 2018-01-20 MED FILL — OMEPRAZOLE/40MG/CPDR: OMEPRAZOLE/40MG/CPDR | 90 days supply | Qty: 90 | Fill #0

## 2018-01-20 NOTE — Unmapped (Signed)
Patient c/o abdominal pain.    Patient is on 2 liters of oxygen.

## 2018-01-20 NOTE — Unmapped (Signed)
Patient c/o abdominal pain.

## 2018-01-20 NOTE — Unmapped (Signed)
Bed: 61-D  Expected date:   Expected time:   Means of arrival:   Comments:  HOLD FOR ROOM 1 TO WAIT FOR DC

## 2018-01-20 NOTE — Unmapped (Signed)
[facility] TRANSPLANT CLINIC PHARMACY NOTE  01/20/2018   Puneet Selden  [id number]    Medication changes today:   1. Hold tacrolimus for 2 doses then decrease tacrolimus to 6 mg qAM and 5 mg qPM   2. Adjust Myfortic to 360 mg TID  3. Stop calcium carbonate PRN  4. Stop clonidine     Education/Adherence tools provided today:  1.provided updated medication list  2. provided additional pill box education  3.  provided additional education on immunosuppression and transplant related medications including reviewing indications of medications, dosing and side effects  4.  provided additional education on constipation and use of laxatives    Follow up items:  1. goal of understanding indications and dosing of immunosuppression medications  2. BP  3. Tacrolimus levels  4. Abdominal pain  5. Crcl for potential Valcyte frequency adjustment    Next visit with pharmacy in 1-2 weeks  ____________________________________________________________________    Hinton Dyer is a 57 y.o. 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     Other PMH significant for hypertension    Seen by pharmacy today for: medication management and pill box fill and adherence education; last seen by pharmacy first visit     CC:  Patient complains of severe abdominal pain that is possibly due to dislodged ureteral stent     Vitals:    01/20/18 1127   BP: 133/86   Pulse: 96   Temp: 36.1 ??C (97 ??F)       Allergies   Allergen Reactions   ??? Buprenorphine Hcl Nausea And Vomiting   ??? Codeine Nausea And Vomiting   ??? Hydrocodone-Acetaminophen Nausea And Vomiting   ??? Morphine Nausea And Vomiting     Halllucinations       All medications reviewed and updated.     Medication list includes revisions made during today???s encounter    Outpatient Encounter Medications as of 01/20/2018   Medication Sig Dispense Refill   ??? acetaminophen (TYLENOL) 500 MG tablet Take 1-2 tablets (500-1,000 mg total) by mouth Three (3) times a day as needed. (Patient taking differently: Take 1 tablet by mouth Three (3) times a day as needed for fever or pain. Take 1 tab (500mg ) 3xday as needed for pain. May increase to 2 tabs (1000mg ) as needed for unrelieved pain.  Indications: Pain) 100 tablet 0   ??? albuterol (PROVENTIL HFA;VENTOLIN HFA) 90 mcg/actuation inhaler Inhale 2 puffs daily as needed for shortness of breath.      ??? amLODIPine (NORVASC) 10 MG tablet Take 1 tablet (10 mg total) by mouth daily. Use only as directed. 30 tablet 11   ??? aspirin (ECOTRIN) 81 MG tablet Take 81 mg by mouth daily.     ??? calcium carbonate (TUMS) 200 mg calcium (500 mg) chewable tablet Chew 2 tablets (400 mg of elem calcium total) Three (3) times a day as needed.  0   ??? [EXPIRED] ciprofloxacin HCl (CIPRO) 500 MG tablet Take 1 tablet (500 mg total) by mouth every twelve (12) hours. for 4 days 8 tablet 0   ??? cloNIDine HCl (CATAPRES) 0.1 MG tablet Take 0.1 mg by mouth Two (2) times a day.      ??? docusate sodium (COLACE) 100 MG capsule Take 1 capsule (100 mg total) by mouth two (2) times a day as needed for constipation. 60 capsule 0   ??? gabapentin (NEURONTIN) 300 MG capsule Take 300 mg by mouth nightly.     ??? hydrALAZINE (APRESOLINE)  25 MG tablet Take 2 tablets (50 mg total) by mouth Three (3) times a day. 180 tablet 0   ??? leg brace (KNEE BRACE) Misc 1 brace for left knee 1 each 0   ??? magnesium oxide-Mg AA chelate (MAGNESIUM, AMINO ACID CHELATE,) 133 mg Tab Take 1 tablet by mouth Two (2) times a day. HOLD until directed to start by your coordinator. 60 tablet 11   ??? metoprolol tartrate (LOPRESSOR) 25 MG tablet Take 3 tablets (75 mg total) by mouth Two (2) times a day. 180 tablet 11   ??? MYFORTIC 180 mg EC tablet Take 3 tablets (540 mg total) by mouth Two (2) times a day. 180 tablet 11   ??? oxyCODONE-acetaminophen (PERCOCET) 10-325 mg per tablet HOLD. Do not take while on Tramadol. 30 tablet 0   ??? polyethylene glycol (MIRALAX) 17 gram packet Take 17 g by mouth daily as needed. 30 each 0   ??? PROGRAF 1 mg capsule Take 8 capsules (8 mg total) by mouth two (2) times a day. 480 capsule 11   ??? rosuvastatin (CRESTOR) 5 MG tablet Take 5 mg by mouth daily.     ??? sulfamethoxazole-trimethoprim (BACTRIM,SEPTRA) 400-80 mg per tablet Take 1 tablet (80 mg of trimethoprim total) by mouth 3 (three) times a week. 12 tablet 5   ??? [EXPIRED] traMADol (ULTRAM) 50 mg tablet Take 1 tablet (50 mg total) by mouth every eight (8) hours as needed for other (pain). for up to 5 days 30 tablet 0   ??? valGANciclovir (VALCYTE) 450 mg tablet Take 1 tablet (450 mg total) by mouth every other day. 30 tablet 2   ??? [DISCONTINUED] omeprazole (PRILOSEC) 40 MG capsule Take 1 capsule (40 mg total) by mouth daily. 90 capsule 3     Facility-Administered Encounter Medications as of 01/20/2018   Medication Dose Route Frequency Provider Last Rate Last Dose   ??? [COMPLETED] sucralfate (CARAFATE) oral suspension  1 g Oral Once Corrinne Eagle, MD   1 g at 01/19/18 1408    And   ??? [COMPLETED] lidocaine (XYLOCAINE) 2% viscous mucosal solution  10 mL Oral Once Corrinne Eagle, MD   10 mL at 01/19/18 1408    And   ??? [COMPLETED] aluminum-magnesium hydroxide-simethicone (MAALOX MAX) 80-80-8 mg/mL oral suspension  30 mL Oral Once Corrinne Eagle, MD   30 mL at 01/19/18 1408   ??? [COMPLETED] dicyclomine (BENTYL) capsule 10 mg  10 mg Oral Once Leda Gauze, MD   10 mg at 01/19/18 1623   ??? [COMPLETED] fentaNYL (PF) (SUBLIMAZE) injection 50 mcg  50 mcg Intravenous Once Merrie Roof, MD   50 mcg at 01/19/18 1318   ??? [COMPLETED] pantoprazole (PROTONIX) injection 40 mg  40 mg Intravenous Once Leda Gauze, MD   40 mg at 01/19/18 1718   ??? [COMPLETED] SMOG ENEMA  240 mL Rectal Once Leda Gauze, MD   240 mL at 01/19/18 1528   ??? [COMPLETED] sodium chloride 0.9% (NS BOLUS) bolus 500 mL  500 mL Intravenous Once Corrinne Eagle, MD   Stopped at 01/19/18 1527       Induction agent : alemtuzumab    CURRENT IMMUNOSUPPRESSION: tacrolimus 8 mg PO BID  prograf/cyclosporine goal: 8-10 myfortic540  mg PO bid    steroid free     Patient complains of severe abdominal pain that started last Thursday (6/6). Upon abdominal exam may be due to dislodged ureteral stent rather than drug induced    IMMUNOSUPPRESSION DRUG LEVELS:  Lab Results  Component Value Date    TACROLIMUS 20.1 (HH) 01/19/2018    TACROLIMUS 8.2 01/15/2018    TACROLIMUS 4.5 (L) 01/12/2018     No results found for: CYCLO  No results found for: EVEROLIMUS  No results found for: SIROLIMUS    Did not have labs drawn today    Graft function: stable  DSA: ntd  Biopsies to date: ntd  WBC/ANC:  wnl    Plan: Will hold two doses of tacrolimus then decrease to 6 mg qAM and 5 mg qPM.  Adjust Myfortic to 360 mg TID. Continue to monitor.    ID prophylaxis:   CMV Status: D-/ R+, moderate risk. CMV prophylaxis: valganciclovir 450 mg every other day x 3 months per protocol (end 04/10/18)  No results found for: CMVCP  PCP Prophylaxis: bactrim SS 1 tab MWF x 6 months. (end 07/10/18)  Thrush: completed in hospital  Patient is  tolerating infectious prophylaxis well    Plan: Continue per protocol. Continue to monitor.    CV Prophylaxis: asa 81 mg   The 10-year ASCVD risk score Denman George DC Jr., et al., 2013) is: 17.6%  Statin therapy: Indicated; currently on rosuvastatin 5 mg daily  Plan: Consider uptitrating dose at later date. Continue to monitor     BP: Goal < 140/90. Clinic vitals reported above  Home BP ranges: 140s/80s per patient. Did not bring log  Current meds include: clonidine 0.1 mg BID, amlodipine 10 mg daily, metoprolol 75 mg BID, hydralazine 50 mg TID  Plan: within goal. Stop clonidine. Continue to monitor    Anemia:  H/H:   Lab Results   Component Value Date    HGB 13.2 01/19/2018     Lab Results   Component Value Date    HCT 43.5 01/19/2018     Iron panel:  No results found for: IRON, TIBC, FERRITIN  No results found for: LABIRON    Prior ESA use: none post transplant    Plan: within goal. Continue to monitor.     DM:   Lab Results   Component Value Date    A1C 5.1 01/07/2018   . Goal A1c < 7  History of Dm? No  Diet: Not currently eating much 2/2 abdominal pain. Last ate toast yesterday  Fluid intake: 48 oz water daily  Exercise:not currently exercising  Plan: Increase water intake to 80-100 oz.  Continue to monitor    Electrolytes: K 5.5 on yesterdays labs, calcium 10.9  Meds currently on: none  Plan: Continue to monitor     GI/BM: pt reports constipation since last Thursday with significant abdominal pain resulting in ED visit on 6/12 (yesterday). Abdominal imaging was WNL with exception of some stool in colon. Patient was given enema that resulted in 3-4 bowel movements but patient still feels constipated with occasional cramping/sharp pain in lower abdomen. Exam by NP Martin-Velez suspects possibly dislodgement of ureteral stent.  Meds currently on: Miralax daily, docusate BID (taking both since Thursday), omeprazole 40 mg daily (has not been taking but should arrive from [facility] today), TUMS for indigestion  Plan: Stop TUMS as may be contributing to constipation.  Increase Miralax to BID. Increase water intake. Continue to monitor    Pain: pt reports significant pain since Thursday as above  Meds currently on: APAP 1g PRN (takes daily), tramadol 50 mg TID PRN (takes ~1 daily); previously on Percocet for back pain but currently on hold; gabapentin 300 mg HS for neuropathy  Plan: Continue to monitor. Coordinator will contact  urology to see if appointment for stent removal can be moved up to next week    Bone health:   Vitamin D Level: none available. Goal > 30.   Last DEXA results:  none available  Current meds include: none  Plan: Vitamin D level  needs to be drawn with next lab schedule.  Continue to monitor.     Women's/Men's Health:  Laresa Oshiro is a 57 y.o. Female perimenopausal. Patient reports no men's/women's health issues  Plan: Continue to monitor    Adherence: Patient has poor understanding of medications; was not able to independently identify names/doses of immunosuppressants and OI meds.  Patient appeared to know indications of medications that she was taking prior to transplant.  Patient does require redirection to remain on topic.  Patient  does not fill their own pill box on a regular basis at home. Home health RN helps with pill box fill (is there three times per week)  Patient brought medication card:yes  Pill box:was correct but only Thurs AM had pill pocket that was filled  Plan: Instructed patient to help home health RN with pill box fill as goal is for her to manage her medications independently and that at next visit she should bring in pill box that is full; provided extensive adherence counseling/intervention    Spent approximately 50 minutes on educating this patient and greater than 50% was spent in direct face to face counseling regarding post transplant medication education. Questions and concerns were address to patient's satisfaction.    Patient was reviewed with Dr. Trilby Drummer DNP who was agreement with the stated plan:     During this visit, the following was completed:   BG log data assessment  BP log data assessment  Labs ordered and evaluated  complex treatment plan >1 DS   Patient education was completed for 25-60 minutes     All questions/concerns were addressed to the patient's satisfaction.  __________________________________________  Cleone Slim, PHARMD  [employer] [employer] [facility]  PAGER [phone]

## 2018-01-20 NOTE — Unmapped (Signed)
Preliminary blood culture #[id number] reports no growth at 24 hours.

## 2018-01-20 NOTE — Unmapped (Signed)
TRANSPLANT SURGERY PROGRESS NOTE    Assessment and Plan  Danielle Parker is a 57 y.o. female who underwent deceased donor renal transplant on 01/08/2018. She presents today for first outpatient evaluation. She was evaluated in the ED yesterday and SRF was consulted for abdominal pain/nausea and vomiting. Today, she was also seen by transplant pharmacy.    From an overall graft perspective, patient doing well, making good urine with baseline creatine now 1.6. Subq drain removed today without complication, she reports scant drainage in bulb over 24 hours (<95mL for the last few days); unfortunately could not confirm with logs as she did not bring them to clinic.    Given up-titration of CNI at discharge patient now supratherapeutic on true trough from yesterday (20.1). After discussion with txp pharmacy will hold todays 2 doses of CNI and decrease to 6/5. Given now s/p smog and gi cocktail, knowing she was holding PPI and supratherpeutic tac, will reintegrate myfortic, as it is less compelling to believe this was the cause of her GI upset.     BP today without medications, systolic 130s. Will d/c clonidine today per discussion with transplant pharmacy.    Acute suprapubic pain was assessed in clinic today. Given this was relieved with urination, in combination with WBC in urine, ureteral stent may be to blame. KUB reviewed by Matilde Haymaker who felt stent was in place but could be removed to see if sx improve. Lyla Son, Statistician will work to schedule next week. Given patient was anuric before txp possible bladder spasms are contributory to pain, could consider antispasmodic if continues.    Follow up: possible stent removal next week, will follow up with SRF in clinic next week.  Labs: continue 3x/week home health labs, repaet labs 6/14 Friday.    I spent 40 minutes with the patient obtaining the above history, medical record review and physical examination, and greater than 50% of the time was spent on counseling and the substance of the discussion.    Subjective  Danielle Parker is a 57 y.o. female who underwent??renal transplantation??on 01/08/2018. Her initial post-operative course was straightforward and she was discharged on POD 6. PMH additionally significant for COPD (home O2 2L [place] PRN), chronic pain, GERD. Notably, she was treated with ciprofloxacin for donor MSSA and enterobacter in lungs during hospital stay.     She presented to the [facility] ED 6/12 with nausea and lower abdominal pain x3 days. She, unknowingly to the team, was taken via EMS to OSH twice prior for similar complaints and underwent a CT scan with unremarkable findings/no fluid collection. Workup was not concerning for infection other than small leukocyte esterase in urine, culture unremarkable. Presentation was consistent with constipation and worsening of GERD symptoms, given that she has been off of PPI as well (cost issue per patient), she underwent SMOG and GI cocktail (no BMx4 days prior and KUB shows stool burden) with symptom improvement.    Today she has little complaint and reports feeling better. Afebrile. She reports tremors and HA with morning med but no additional vomiting/GI issues; tacrolimus uptitrated at discharge. She hasn't yet taken medications today. She reports making a lot of urine, she forgot her log at home. No additional constitutional symptoms, no chest pain, using 2L [place] for SOB of getting up to clinic.    Upon completion of the visit patient experienced acute onset of suprapubic/abdominal pain; she left the exam room to use the rest room and her pain was relieved with voiding.  Current Outpatient Medications   Medication Sig Dispense Refill   ??? acetaminophen (TYLENOL) 500 MG tablet Take 1-2 tablets (500-1,000 mg total) by mouth Three (3) times a day as needed. (Patient taking differently: Take 1 tablet by mouth Three (3) times a day as needed for fever or pain. Take 1 tab (500mg ) 3xday as needed for pain. May increase to 2 tabs (1000mg ) as needed for unrelieved pain.  Indications: Pain) 100 tablet 0   ??? albuterol (PROVENTIL HFA;VENTOLIN HFA) 90 mcg/actuation inhaler Inhale 2 puffs daily as needed for shortness of breath.      ??? amLODIPine (NORVASC) 10 MG tablet Take 1 tablet (10 mg total) by mouth daily. Use only as directed. 30 tablet 11   ??? aspirin (ECOTRIN) 81 MG tablet Take 81 mg by mouth daily.     ??? docusate sodium (COLACE) 100 MG capsule Take 1 capsule (100 mg total) by mouth two (2) times a day as needed for constipation. 60 capsule 0   ??? gabapentin (NEURONTIN) 300 MG capsule Take 300 mg by mouth nightly.     ??? hydrALAZINE (APRESOLINE) 25 MG tablet Take 2 tablets (50 mg total) by mouth Three (3) times a day. 180 tablet 0   ??? leg brace (KNEE BRACE) Misc 1 brace for left knee 1 each 0   ??? magnesium oxide-Mg AA chelate (MAGNESIUM, AMINO ACID CHELATE,) 133 mg Tab Take 1 tablet by mouth Two (2) times a day. HOLD until directed to start by your coordinator. 60 tablet 11   ??? metoprolol tartrate (LOPRESSOR) 25 MG tablet Take 3 tablets (75 mg total) by mouth Two (2) times a day. 180 tablet 11   ??? MYFORTIC 180 mg EC tablet Take 3 tablets (540 mg total) by mouth Two (2) times a day. 180 tablet 11   ??? omeprazole (PRILOSEC) 40 MG capsule Take 1 capsule (40 mg total) by mouth daily. 90 capsule 3   ??? oxyCODONE-acetaminophen (PERCOCET) 10-325 mg per tablet HOLD. Do not take while on Tramadol. 30 tablet 0   ??? polyethylene glycol (MIRALAX) 17 gram packet Take 17 g by mouth daily as needed. 30 each 0   ??? PROGRAF 1 mg capsule Take 6 mg every morning and 5 mg every evening 330 capsule 11   ??? rosuvastatin (CRESTOR) 5 MG tablet Take 5 mg by mouth daily.     ??? sulfamethoxazole-trimethoprim (BACTRIM,SEPTRA) 400-80 mg per tablet Take 1 tablet (80 mg of trimethoprim total) by mouth 3 (three) times a week. 12 tablet 5   ??? traMADol (ULTRAM) 50 mg tablet Take 1 tablet (50 mg total) by mouth every eight (8) hours as needed for other (pain). for up to 5 days 30 tablet 0 ??? valGANciclovir (VALCYTE) 450 mg tablet Take 1 tablet (450 mg total) by mouth every other day. 30 tablet 2     No current facility-administered medications for this visit.      Objective    Vitals:    01/20/18 1125   BP: 133/86   Pulse: 96   Temp: 36.1 ??C (97 ??F)   TempSrc: Tympanic   SpO2: 92%   Weight: 75.8 kg (167 lb 3.2 oz)   Height: 160 cm (5' 2.99)      Body mass index is 29.63 kg/m??.     Physical Exam:    General Appearance:    No acute distress, wearing 2L [place]  Lungs:                 Clear to  auscultation bilaterally  Heart:                            Regular rate and rhythm  Abdomen:                 Soft, round, non-tender, incision C/D/I extends to low pubic area. JP drain site c/d/i, scant serosangionous drainage in line, removed without complication  Extremities:               Warm and well perfused, no edema.    Data Review:  All lab results last 24 hours reviewed    Imaging: reviewed from ED.    _________________________________________________    Gertie Fey, DNP, APRN, FNP-C  [facility]  [facility]  [address]  [place], [place]  [address]

## 2018-01-21 DIAGNOSIS — B9689 Other specified bacterial agents as the cause of diseases classified elsewhere: Secondary | ICD-10-CM

## 2018-01-21 DIAGNOSIS — T8613 Kidney transplant infection: Principal | ICD-10-CM

## 2018-01-21 DIAGNOSIS — M170 Bilateral primary osteoarthritis of knee: Secondary | ICD-10-CM

## 2018-01-21 DIAGNOSIS — N186 End stage renal disease: Secondary | ICD-10-CM

## 2018-01-21 DIAGNOSIS — M792 Neuralgia and neuritis, unspecified: Secondary | ICD-10-CM

## 2018-01-21 DIAGNOSIS — M160 Bilateral primary osteoarthritis of hip: Secondary | ICD-10-CM

## 2018-01-21 DIAGNOSIS — Z5181 Encounter for therapeutic drug level monitoring: Secondary | ICD-10-CM

## 2018-01-21 DIAGNOSIS — R7881 Bacteremia: Secondary | ICD-10-CM

## 2018-01-21 DIAGNOSIS — Z79899 Other long term (current) drug therapy: Secondary | ICD-10-CM

## 2018-01-21 DIAGNOSIS — B9561 Methicillin susceptible Staphylococcus aureus infection as the cause of diseases classified elsewhere: Secondary | ICD-10-CM

## 2018-01-21 DIAGNOSIS — I132 Hypertensive heart and chronic kidney disease with heart failure and with stage 5 chronic kidney disease, or end stage renal disease: Secondary | ICD-10-CM

## 2018-01-21 DIAGNOSIS — Z87891 Personal history of nicotine dependence: Secondary | ICD-10-CM

## 2018-01-21 DIAGNOSIS — J449 Chronic obstructive pulmonary disease, unspecified: Secondary | ICD-10-CM

## 2018-01-21 DIAGNOSIS — I5032 Chronic diastolic (congestive) heart failure: Secondary | ICD-10-CM

## 2018-01-21 DIAGNOSIS — G894 Chronic pain syndrome: Secondary | ICD-10-CM

## 2018-01-21 LAB — MYCOPHENOLIC ACID (CELLCEPT)
MPA Glucuronide: 70 ug/mL (ref 15–125)
MPA: 1 ug/mL (ref 1.0–3.5)

## 2018-01-21 LAB — TACROLIMUS LEVEL: TACROLIMUS (FK506) - LABCORP: 22.2 ng/mL — AB (ref 2.0–20.0)

## 2018-01-21 MED ORDER — ACETAMINOPHEN 500 MG TABLET
ORAL | 99 refills | 0.00000 days | Status: CP
Start: 2018-01-21 — End: 2018-03-17

## 2018-01-21 MED ORDER — PROGRAF 1 MG CAPSULE
ORAL_CAPSULE | 11 refills | 0 days | Status: CP
Start: 2018-01-21 — End: 2018-01-28

## 2018-01-21 MED ORDER — ACETAMINOPHEN 500 MG TABLET: tablet | 0 refills | 0 days | Status: AC

## 2018-01-21 NOTE — Unmapped (Signed)
Pt request for RX Refill

## 2018-01-21 NOTE — Unmapped (Signed)
Blood culture #[id number] remains at this time/date with no growth at 48 hours.

## 2018-01-22 DIAGNOSIS — G894 Chronic pain syndrome: Secondary | ICD-10-CM

## 2018-01-22 DIAGNOSIS — Z5181 Encounter for therapeutic drug level monitoring: Secondary | ICD-10-CM

## 2018-01-22 DIAGNOSIS — J449 Chronic obstructive pulmonary disease, unspecified: Secondary | ICD-10-CM

## 2018-01-22 DIAGNOSIS — I5032 Chronic diastolic (congestive) heart failure: Secondary | ICD-10-CM

## 2018-01-22 DIAGNOSIS — Z87891 Personal history of nicotine dependence: Secondary | ICD-10-CM

## 2018-01-22 DIAGNOSIS — R7881 Bacteremia: Secondary | ICD-10-CM

## 2018-01-22 DIAGNOSIS — M792 Neuralgia and neuritis, unspecified: Secondary | ICD-10-CM

## 2018-01-22 DIAGNOSIS — N186 End stage renal disease: Secondary | ICD-10-CM

## 2018-01-22 DIAGNOSIS — T8613 Kidney transplant infection: Principal | ICD-10-CM

## 2018-01-22 DIAGNOSIS — B9689 Other specified bacterial agents as the cause of diseases classified elsewhere: Secondary | ICD-10-CM

## 2018-01-22 DIAGNOSIS — I132 Hypertensive heart and chronic kidney disease with heart failure and with stage 5 chronic kidney disease, or end stage renal disease: Secondary | ICD-10-CM

## 2018-01-22 DIAGNOSIS — M160 Bilateral primary osteoarthritis of hip: Secondary | ICD-10-CM

## 2018-01-22 DIAGNOSIS — Z79899 Other long term (current) drug therapy: Secondary | ICD-10-CM

## 2018-01-22 DIAGNOSIS — M170 Bilateral primary osteoarthritis of knee: Secondary | ICD-10-CM

## 2018-01-22 DIAGNOSIS — B9561 Methicillin susceptible Staphylococcus aureus infection as the cause of diseases classified elsewhere: Secondary | ICD-10-CM

## 2018-01-24 ENCOUNTER — Encounter: Admit: 2018-01-24 | Discharge: 2018-01-25 | Payer: 59

## 2018-01-24 DIAGNOSIS — Z5181 Encounter for therapeutic drug level monitoring: Secondary | ICD-10-CM

## 2018-01-24 DIAGNOSIS — M170 Bilateral primary osteoarthritis of knee: Secondary | ICD-10-CM

## 2018-01-24 DIAGNOSIS — Z79899 Other long term (current) drug therapy: Secondary | ICD-10-CM

## 2018-01-24 DIAGNOSIS — N186 End stage renal disease: Secondary | ICD-10-CM

## 2018-01-24 DIAGNOSIS — J449 Chronic obstructive pulmonary disease, unspecified: Secondary | ICD-10-CM

## 2018-01-24 DIAGNOSIS — B9561 Methicillin susceptible Staphylococcus aureus infection as the cause of diseases classified elsewhere: Secondary | ICD-10-CM

## 2018-01-24 DIAGNOSIS — B9689 Other specified bacterial agents as the cause of diseases classified elsewhere: Secondary | ICD-10-CM

## 2018-01-24 DIAGNOSIS — G894 Chronic pain syndrome: Secondary | ICD-10-CM

## 2018-01-24 DIAGNOSIS — R7881 Bacteremia: Secondary | ICD-10-CM

## 2018-01-24 DIAGNOSIS — T8613 Kidney transplant infection: Principal | ICD-10-CM

## 2018-01-24 DIAGNOSIS — Z87891 Personal history of nicotine dependence: Secondary | ICD-10-CM

## 2018-01-24 DIAGNOSIS — M160 Bilateral primary osteoarthritis of hip: Secondary | ICD-10-CM

## 2018-01-24 DIAGNOSIS — I132 Hypertensive heart and chronic kidney disease with heart failure and with stage 5 chronic kidney disease, or end stage renal disease: Secondary | ICD-10-CM

## 2018-01-24 DIAGNOSIS — Z940 Kidney transplant status: Principal | ICD-10-CM

## 2018-01-24 DIAGNOSIS — I5032 Chronic diastolic (congestive) heart failure: Secondary | ICD-10-CM

## 2018-01-24 DIAGNOSIS — M792 Neuralgia and neuritis, unspecified: Secondary | ICD-10-CM

## 2018-01-24 LAB — PHOSPHORUS: Phosphate:MCnc:Pt:Ser/Plas:Qn:: 1.9 — ABNORMAL LOW

## 2018-01-24 LAB — CBC W/ AUTO DIFF
BASOPHILS ABSOLUTE COUNT: 0.1 10*9/L (ref 0.0–0.1)
BASOPHILS RELATIVE PERCENT: 1.4 %
EOSINOPHILS ABSOLUTE COUNT: 0.2 10*9/L (ref 0.0–0.4)
EOSINOPHILS RELATIVE PERCENT: 5 %
HEMATOCRIT: 37.1 % (ref 36.0–46.0)
HEMOGLOBIN: 11.5 g/dL — ABNORMAL LOW (ref 13.5–16.0)
LARGE UNSTAINED CELLS: 2 % (ref 0–4)
LYMPHOCYTES RELATIVE PERCENT: 3.1 %
MEAN CORPUSCULAR HEMOGLOBIN CONC: 31 g/dL (ref 31.0–37.0)
MEAN CORPUSCULAR HEMOGLOBIN: 29.3 pg (ref 26.0–34.0)
MEAN CORPUSCULAR VOLUME: 94.5 fL (ref 80.0–100.0)
MEAN PLATELET VOLUME: 10.1 fL — ABNORMAL HIGH (ref 7.0–10.0)
MONOCYTES ABSOLUTE COUNT: 0.2 10*9/L (ref 0.2–0.8)
MONOCYTES RELATIVE PERCENT: 4.8 %
NEUTROPHILS ABSOLUTE COUNT: 3.4 10*9/L (ref 2.0–7.5)
NEUTROPHILS RELATIVE PERCENT: 84 %
PLATELET COUNT: 240 10*9/L (ref 150–440)
RED BLOOD CELL COUNT: 3.92 10*12/L — ABNORMAL LOW (ref 4.00–5.20)
RED CELL DISTRIBUTION WIDTH: 20.1 % — ABNORMAL HIGH (ref 12.0–15.0)
WBC ADJUSTED: 4.1 10*9/L — ABNORMAL LOW (ref 4.5–11.0)

## 2018-01-24 LAB — ELECTROLYTE PANEL
ANION GAP: 9 mmol/L (ref 9–15)
CO2: 18 mmol/L — ABNORMAL LOW (ref 22.0–30.0)
POTASSIUM: 5.5 mmol/L — ABNORMAL HIGH (ref 3.5–5.0)
SODIUM: 135 mmol/L (ref 135–145)

## 2018-01-24 LAB — CALCIUM: Calcium:MCnc:Pt:Ser/Plas:Qn:: 9.9

## 2018-01-24 LAB — BLOOD UREA NITROGEN: Urea nitrogen:MCnc:Pt:Ser/Plas:Qn:: 32 — ABNORMAL HIGH

## 2018-01-24 LAB — NEUTROPHILS ABSOLUTE COUNT: Lab: 3.4

## 2018-01-24 LAB — GLUCOSE RANDOM: Glucose:MCnc:Pt:Ser/Plas:Qn:: 91

## 2018-01-24 LAB — ALBUMIN: Albumin:MCnc:Pt:Ser/Plas:Qn:: 4

## 2018-01-24 LAB — MAGNESIUM: Magnesium:MCnc:Pt:Ser/Plas:Qn:: 2.2

## 2018-01-24 LAB — EGFR MDRD NON AF AMER
Glomerular filtration rate/1.73 sq M.predicted.non black:ArVRat:Pt:Ser/Plas/Bld:Qn:Creatinine-based formula (MDRD): 26 — ABNORMAL LOW

## 2018-01-24 LAB — SODIUM: Sodium:SCnc:Pt:Ser/Plas:Qn:: 135

## 2018-01-24 MED FILL — ACETAMINOPHEN EXTRA STREN/500MG/TABS: ACETAMINOPHEN EXTRA STREN/500MG/TABS | 16 days supply | Qty: 100 | Fill #0

## 2018-01-24 NOTE — Unmapped (Signed)
I called patient on Friday 6/14 to see if [facility] had come to draw her labs as I had called them on Thursday 6/13 to confirm that they had it scheduled.  She said she told them not to come on Friday as she had an appt with her PCP.  I told to talk with me next time before she does that as we really needed them to draw her labs.  She told me she would next time.  I called her today home health was out but her labs look like she is dry.  She stated she is eating and drinking much better since we spaced out her myfortic.  The only thing was she had trouble getting out and getting things to drink but she has fixed that today. I told her we needed her to drink more.  She confirmed that she skipped the 2 prograf doses and then went to 6mg  and 5mg .  She also spaced out the myfortic 2 tabs tid. She asked about something to help her sleep.  I told her to try 1 benadryl 25mg  tablet and see if they works.   We will repeat labs on Wednesday and I reminded her to me for questions.

## 2018-01-24 NOTE — Unmapped (Signed)
Final blood culture #[id number] reports no growth at 5 days.

## 2018-01-25 DIAGNOSIS — Z87891 Personal history of nicotine dependence: Secondary | ICD-10-CM

## 2018-01-25 DIAGNOSIS — M170 Bilateral primary osteoarthritis of knee: Secondary | ICD-10-CM

## 2018-01-25 DIAGNOSIS — B9561 Methicillin susceptible Staphylococcus aureus infection as the cause of diseases classified elsewhere: Secondary | ICD-10-CM

## 2018-01-25 DIAGNOSIS — N186 End stage renal disease: Secondary | ICD-10-CM

## 2018-01-25 DIAGNOSIS — Z79899 Other long term (current) drug therapy: Secondary | ICD-10-CM

## 2018-01-25 DIAGNOSIS — R7881 Bacteremia: Secondary | ICD-10-CM

## 2018-01-25 DIAGNOSIS — Z5181 Encounter for therapeutic drug level monitoring: Secondary | ICD-10-CM

## 2018-01-25 DIAGNOSIS — J449 Chronic obstructive pulmonary disease, unspecified: Secondary | ICD-10-CM

## 2018-01-25 DIAGNOSIS — M792 Neuralgia and neuritis, unspecified: Secondary | ICD-10-CM

## 2018-01-25 DIAGNOSIS — T8613 Kidney transplant infection: Principal | ICD-10-CM

## 2018-01-25 DIAGNOSIS — B9689 Other specified bacterial agents as the cause of diseases classified elsewhere: Secondary | ICD-10-CM

## 2018-01-25 DIAGNOSIS — G894 Chronic pain syndrome: Secondary | ICD-10-CM

## 2018-01-25 DIAGNOSIS — I5032 Chronic diastolic (congestive) heart failure: Secondary | ICD-10-CM

## 2018-01-25 DIAGNOSIS — M160 Bilateral primary osteoarthritis of hip: Secondary | ICD-10-CM

## 2018-01-25 DIAGNOSIS — I132 Hypertensive heart and chronic kidney disease with heart failure and with stage 5 chronic kidney disease, or end stage renal disease: Secondary | ICD-10-CM

## 2018-01-25 LAB — TACROLIMUS LEVEL, TROUGH: TACROLIMUS, TROUGH: 6.9 ng/mL (ref 5.0–15.0)

## 2018-01-25 LAB — TACROLIMUS, TROUGH: Lab: 6.9

## 2018-01-26 ENCOUNTER — Encounter: Admit: 2018-01-26 | Discharge: 2018-01-27 | Payer: 59

## 2018-01-26 DIAGNOSIS — M170 Bilateral primary osteoarthritis of knee: Secondary | ICD-10-CM

## 2018-01-26 DIAGNOSIS — I5032 Chronic diastolic (congestive) heart failure: Secondary | ICD-10-CM

## 2018-01-26 DIAGNOSIS — Z5181 Encounter for therapeutic drug level monitoring: Secondary | ICD-10-CM

## 2018-01-26 DIAGNOSIS — G894 Chronic pain syndrome: Secondary | ICD-10-CM

## 2018-01-26 DIAGNOSIS — R7881 Bacteremia: Secondary | ICD-10-CM

## 2018-01-26 DIAGNOSIS — M160 Bilateral primary osteoarthritis of hip: Secondary | ICD-10-CM

## 2018-01-26 DIAGNOSIS — J449 Chronic obstructive pulmonary disease, unspecified: Secondary | ICD-10-CM

## 2018-01-26 DIAGNOSIS — M792 Neuralgia and neuritis, unspecified: Secondary | ICD-10-CM

## 2018-01-26 DIAGNOSIS — B9561 Methicillin susceptible Staphylococcus aureus infection as the cause of diseases classified elsewhere: Secondary | ICD-10-CM

## 2018-01-26 DIAGNOSIS — N186 End stage renal disease: Secondary | ICD-10-CM

## 2018-01-26 DIAGNOSIS — B9689 Other specified bacterial agents as the cause of diseases classified elsewhere: Secondary | ICD-10-CM

## 2018-01-26 DIAGNOSIS — T8613 Kidney transplant infection: Principal | ICD-10-CM

## 2018-01-26 DIAGNOSIS — I132 Hypertensive heart and chronic kidney disease with heart failure and with stage 5 chronic kidney disease, or end stage renal disease: Secondary | ICD-10-CM

## 2018-01-26 DIAGNOSIS — Z87891 Personal history of nicotine dependence: Secondary | ICD-10-CM

## 2018-01-26 DIAGNOSIS — Z940 Kidney transplant status: Principal | ICD-10-CM

## 2018-01-26 DIAGNOSIS — Z79899 Other long term (current) drug therapy: Secondary | ICD-10-CM

## 2018-01-26 LAB — CBC W/ AUTO DIFF
BASOPHILS ABSOLUTE COUNT: 0.1 10*9/L (ref 0.0–0.1)
BASOPHILS RELATIVE PERCENT: 2.1 %
EOSINOPHILS ABSOLUTE COUNT: 0.2 10*9/L (ref 0.0–0.4)
HEMATOCRIT: 39.6 % (ref 36.0–46.0)
HEMOGLOBIN: 11.9 g/dL — ABNORMAL LOW (ref 13.5–16.0)
LARGE UNSTAINED CELLS: 2 % (ref 0–4)
LYMPHOCYTES ABSOLUTE COUNT: 0.1 10*9/L — ABNORMAL LOW (ref 1.5–5.0)
LYMPHOCYTES RELATIVE PERCENT: 3.8 %
MEAN CORPUSCULAR HEMOGLOBIN CONC: 30 g/dL — ABNORMAL LOW (ref 31.0–37.0)
MEAN CORPUSCULAR HEMOGLOBIN: 29.4 pg (ref 26.0–34.0)
MEAN CORPUSCULAR VOLUME: 98.2 fL (ref 80.0–100.0)
MEAN PLATELET VOLUME: 10.2 fL — ABNORMAL HIGH (ref 7.0–10.0)
MONOCYTES ABSOLUTE COUNT: 0.2 10*9/L (ref 0.2–0.8)
NEUTROPHILS ABSOLUTE COUNT: 3.1 10*9/L (ref 2.0–7.5)
NEUTROPHILS RELATIVE PERCENT: 82 %
PLATELET COUNT: 213 10*9/L (ref 150–440)
RED BLOOD CELL COUNT: 4.03 10*12/L (ref 4.00–5.20)
RED CELL DISTRIBUTION WIDTH: 20.4 % — ABNORMAL HIGH (ref 12.0–15.0)
WBC ADJUSTED: 3.7 10*9/L — ABNORMAL LOW (ref 4.5–11.0)

## 2018-01-26 LAB — BASIC METABOLIC PANEL
ANION GAP: 10 mmol/L (ref 9–15)
BLOOD UREA NITROGEN: 18 mg/dL (ref 7–21)
BUN / CREAT RATIO: 14
CO2: 18 mmol/L — ABNORMAL LOW (ref 22.0–30.0)
CREATININE: 1.28 mg/dL — ABNORMAL HIGH (ref 0.60–1.00)
EGFR MDRD AF AMER: 52 mL/min/{1.73_m2} — ABNORMAL LOW (ref >=60)
EGFR MDRD NON AF AMER: 43 mL/min/{1.73_m2} — ABNORMAL LOW (ref >=60)
GLUCOSE RANDOM: 101 mg/dL (ref 65–179)
POTASSIUM: 5.2 mmol/L — ABNORMAL HIGH (ref 3.5–5.0)
SODIUM: 140 mmol/L (ref 135–145)

## 2018-01-26 LAB — LYMPHOCYTES ABSOLUTE COUNT: Lab: 0.1 — ABNORMAL LOW

## 2018-01-26 LAB — PHOSPHORUS: Phosphate:MCnc:Pt:Ser/Plas:Qn:: 2.6 — ABNORMAL LOW

## 2018-01-26 LAB — MAGNESIUM
MAGNESIUM: 1.7 mg/dL (ref 1.6–2.2)
Magnesium:MCnc:Pt:Ser/Plas:Qn:: 1.7

## 2018-01-26 LAB — CO2: Carbon dioxide:SCnc:Pt:Ser/Plas:Qn:: 18 — ABNORMAL LOW

## 2018-01-26 LAB — SMEAR REVIEW: Lab: 0

## 2018-01-26 LAB — ALBUMIN: Albumin:MCnc:Pt:Ser/Plas:Qn:: 4.1

## 2018-01-26 NOTE — Unmapped (Signed)
Date of operation:  01/08/2018    Surgeon:  Allyn Kenner MD    Assistants:  Clint Bolder MD                       Corrinne Eagle MD    Preop Dx:  ESRD    Postop Dx:  Same    Procedures Performed:   1.  Kidney transplant   2.  Core Needle Kidney biopsy    Anesthesia:  GETA    Specimens:  Kidney biopsy for pathology    Drains:  Standard    Description of Procedure:   The patient was correctly identified in the preop holding area.  He was taken to the operating room and laid supine on operating table.  A timeout was called.  The patient, procedure, UNOS number, recipient and donor blood types, and induction medication were all verified.  General anesthesia was administered.  The patients abdomen was prepped and draped in the usual sterile fashion.  A RLQ incision was made sharply.  The incision was carried down through the subcutaneous tissue to the level of the fascia with cautery.  The fascia was incised with cautery exposing the rectus muscle.  The retroperitoneal space was developed bluntly and the peritoneum was medialized.  Retractors were placed at this time.  The external iliac artery and vein were skeletonized with a combination of cautery, and blunt dissection.  Large lymphatic channels were clipped or ligated with silk suture.  The kidney was brought up from the backtable onto the field and remained on ice.  A spleno-renal clamp was used to clamp the external iliac vein.  The vein was opened with a scalpel and the venotomy extended with Pott's scissors.  Heparinized saline was used to flush the vein.  The kidney was brought to the incsion, and the venous anastomosis performed with two stay sutures and running 5-0 prolene suture.  The external iliac artery was then clamped proximally and distally with straight Cooley's.  The artery was opened with a scalpel, and the arteriotomy extended with Pott's scissors.   The Carrel patch was then trimmed with tenotomy scissors.  The arterial anastomosis was completed with two stay sutures and running 6-0 prolene suture.  Bulldog clamps were used to clamp the renal artery and vein.  The spleno-renal clamp was removed from the vein and no bleeding was noted from the anastomosis.  The proximal Cooley was then removed, followed by the distal Cooley clamp.  No bleeding was noted from the arterial anastomosis.  The bulldog clamps were then removed sequentially, venous then arterial, and the kidney was reperfused.  Small areas of capsular bleeding were controlled with cautery.  Bleeding from the apron was controlled with a combination of cautery and clips.  Warm saline was used to irrigate the incision.  Hemostasis was noted.  The kidney appeared well perfused, with good doppler signals.  The bladder was then distended with the LR/gentamicin solution.  The dome of the bladder was identified and the peri-vesicle fat, and detrusor muscle was opened with cautery.  The bladder mucosa was easily identifiable.  The donor ureter was shortened, and the peri-ureteral tissue ligated with 3-0 silk suture.  The bladder mucosa was opened sharply, and two 6-0PDS stay sutures were placed between bladder and ureteral mucosa.  A 70fr double J stent was placed into the donor ureter to the level of the renal pelvis, and the distal end placed into the bladder.  The ureterneocystostomy was then  completed with running 6-0PDS.   The detrusor and perivesicle fat was re-approximated over the anastomosis with 4-0PDS in interrupted fashion.  Hemostasis was again assessed.  The incision was again irrigated.   Core needle kidney biopsy was performed at the superior pole. The incision was again irrigated, and flow to the kidney was excellent.  The JP drain was placed in the retroperitoneum in standard fashion, and secured to skin with 3-0 nylon.  The fascia was closed with running #1PDS x2.  The skin was closed with staples.  A sterile dressing was applied.  The patient was extubated and transported to PACU in stable condition.

## 2018-01-27 ENCOUNTER — Ambulatory Visit: Admit: 2018-01-27 | Discharge: 2018-01-28 | Payer: 59

## 2018-01-27 DIAGNOSIS — Z940 Kidney transplant status: Principal | ICD-10-CM

## 2018-01-27 LAB — TACROLIMUS, TROUGH: Lab: 6

## 2018-01-27 NOTE — Unmapped (Signed)
Procedures  57yF s/p transplant.    Cystoscopy, ureteral stent removal  Timeout was performed immediately prior to the procedure.    The patient was prepped and draped in the usual sterile fashion.  Flexible cystoscopy was performed.  The indwelling transplant ureteral stent was visualized, grasped, and removed intact.  The patient tolerated the procedure well and was not given one dose of antibiotic prophylaxis afterwards.    Plan: return prn

## 2018-01-27 NOTE — Unmapped (Signed)
[facility]:  Taking Care of Yourself After Cystoscopy Procedures    *Drink plenty of water for a day or two following your procedure.  Try to have about 8 ounces (one cup) at a time, and do this 6 times or more per day.  (If you have fluid restrictions, please ask the nurse or doctor for advice).    *AVOID alcoholic, carbonated and caffeinated drinks for a day or two, as they may cause uncomfortable symptoms.    *For the first 8 hours after the procedure, your urine may be pink or red in color.  Small clots or a few drops of blood can be a normal side effect of the instruments.  Large amounts of bleeding or difficulty urinating are not normal.  Call your doctor if this happens.    *You may experience some mild discomfort of a burning sensation with urination after having this procedure.  If it does not improve, or if other symptoms appear (fever, chills, or difficulty emptying), call your doctor.    *You may return to normal daily activities such as work, school, driving, exercising and housework.    *If your doctor gave you a prescription, take it as ordered.    *If you need a return appointment, the secretary will make it for you when you check out. To contact the Urology Clinic during business hours, call [phone].    *[facility] Operator can be reached at [phone] if you need to get in contact with your doctor.  After the hours, the operator can page the doctor on call for urgent concerns:    Urology Patients should ask for the Urology resident “on call”.    You can get more immediate assistance at the Emergency Room or Urgent Care if necessary.

## 2018-01-27 NOTE — Unmapped (Signed)
Scope: [id number]

## 2018-01-27 NOTE — Unmapped (Signed)
Called patient to complete Medicare 2728 form. Patient stated that she was doing well and form was completed. Loma Boston, RN Inpatient Transplant Nurse Coordinator 01/27/2018 4:09 PM

## 2018-01-28 ENCOUNTER — Encounter: Admit: 2018-01-28 | Discharge: 2018-01-29 | Payer: 59

## 2018-01-28 DIAGNOSIS — M160 Bilateral primary osteoarthritis of hip: Secondary | ICD-10-CM

## 2018-01-28 DIAGNOSIS — B9689 Other specified bacterial agents as the cause of diseases classified elsewhere: Secondary | ICD-10-CM

## 2018-01-28 DIAGNOSIS — G894 Chronic pain syndrome: Secondary | ICD-10-CM

## 2018-01-28 DIAGNOSIS — I5032 Chronic diastolic (congestive) heart failure: Secondary | ICD-10-CM

## 2018-01-28 DIAGNOSIS — I132 Hypertensive heart and chronic kidney disease with heart failure and with stage 5 chronic kidney disease, or end stage renal disease: Secondary | ICD-10-CM

## 2018-01-28 DIAGNOSIS — B9561 Methicillin susceptible Staphylococcus aureus infection as the cause of diseases classified elsewhere: Secondary | ICD-10-CM

## 2018-01-28 DIAGNOSIS — Z87891 Personal history of nicotine dependence: Secondary | ICD-10-CM

## 2018-01-28 DIAGNOSIS — Z79899 Other long term (current) drug therapy: Secondary | ICD-10-CM

## 2018-01-28 DIAGNOSIS — J449 Chronic obstructive pulmonary disease, unspecified: Secondary | ICD-10-CM

## 2018-01-28 DIAGNOSIS — Z5181 Encounter for therapeutic drug level monitoring: Secondary | ICD-10-CM

## 2018-01-28 DIAGNOSIS — N186 End stage renal disease: Secondary | ICD-10-CM

## 2018-01-28 DIAGNOSIS — R7881 Bacteremia: Secondary | ICD-10-CM

## 2018-01-28 DIAGNOSIS — Z940 Kidney transplant status: Principal | ICD-10-CM

## 2018-01-28 DIAGNOSIS — M170 Bilateral primary osteoarthritis of knee: Secondary | ICD-10-CM

## 2018-01-28 DIAGNOSIS — T8613 Kidney transplant infection: Principal | ICD-10-CM

## 2018-01-28 DIAGNOSIS — M792 Neuralgia and neuritis, unspecified: Secondary | ICD-10-CM

## 2018-01-28 LAB — CBC W/ AUTO DIFF
BASOPHILS ABSOLUTE COUNT: 0.1 10*9/L (ref 0.0–0.1)
BASOPHILS RELATIVE PERCENT: 2.3 %
EOSINOPHILS RELATIVE PERCENT: 8 %
HEMOGLOBIN: 11.7 g/dL — ABNORMAL LOW (ref 13.5–16.0)
LARGE UNSTAINED CELLS: 2 % (ref 0–4)
LYMPHOCYTES ABSOLUTE COUNT: 0.1 10*9/L — ABNORMAL LOW (ref 1.5–5.0)
LYMPHOCYTES RELATIVE PERCENT: 4.6 %
MEAN CORPUSCULAR HEMOGLOBIN CONC: 30.9 g/dL — ABNORMAL LOW (ref 31.0–37.0)
MEAN CORPUSCULAR HEMOGLOBIN: 30 pg (ref 26.0–34.0)
MEAN CORPUSCULAR VOLUME: 97.2 fL (ref 80.0–100.0)
MEAN PLATELET VOLUME: 10.1 fL — ABNORMAL HIGH (ref 7.0–10.0)
MONOCYTES ABSOLUTE COUNT: 0.2 10*9/L (ref 0.2–0.8)
MONOCYTES RELATIVE PERCENT: 5.5 %
NEUTROPHILS ABSOLUTE COUNT: 2.1 10*9/L (ref 2.0–7.5)
NEUTROPHILS RELATIVE PERCENT: 77.4 %
PLATELET COUNT: 152 10*9/L (ref 150–440)
RED BLOOD CELL COUNT: 3.88 10*12/L — ABNORMAL LOW (ref 4.00–5.20)
RED CELL DISTRIBUTION WIDTH: 20.6 % — ABNORMAL HIGH (ref 12.0–15.0)
WBC ADJUSTED: 2.7 10*9/L — ABNORMAL LOW (ref 4.5–11.0)

## 2018-01-28 LAB — MONOCYTES RELATIVE PERCENT: Lab: 5.5

## 2018-01-28 LAB — SMEAR REVIEW: Lab: 0

## 2018-01-28 MED ORDER — VALGANCICLOVIR 450 MG TABLET
ORAL_TABLET | ORAL | 2 refills | 0 days | Status: CP
Start: 2018-01-28 — End: 2018-02-03

## 2018-01-28 MED ORDER — SULFAMETHOXAZOLE 400 MG-TRIMETHOPRIM 80 MG TABLET
ORAL_TABLET | ORAL | 5 refills | 0.00000 days | Status: CP
Start: 2018-01-28 — End: 2018-03-28

## 2018-01-28 MED ORDER — PROGRAF 1 MG CAPSULE
ORAL_CAPSULE | Freq: Two times a day (BID) | ORAL | 11 refills | 0 days | Status: CP
Start: 2018-01-28 — End: 2018-02-04

## 2018-01-28 NOTE — Unmapped (Signed)
Rx sent to [facility] for bactrim and valcyte at [employer] request via phone.

## 2018-01-28 NOTE — Unmapped (Addendum)
Spoke w/Jacky Brummet via home mobile phone.    Pt is 20 days post kidney transplant:     1.) Tac tr 01/26/18 at 0910 AM 6.0  Goal 8-10  Confirmed with pt 12-hour trough  Current dose tac 6/5 confirmed    2.) [facility] nurse, Elease Hashimoto, with her. C/o all home health staffing having extreme difficultly drawning labs, multiple attempts and at times with little success. Asking for a mid-line. Staff are not using her right (fistula side) arm for lab draws. Home health wants to know how much longer they will need to draw three times a week, I replied likely a minimum of two more weeks depending on results.    Epic In basket message to Dr. Nestor Lewandowsky, Dr. Norma Fredrickson, Estill Batten, DNP, Cleone Slim, PharmD with all above info.    01/28/18 @ 1230 spoke w/[facility] via phone to let her know the following increase to her medicine ordered by Valentina Shaggy, PharmD and Gertie Fey, DNP. She is to take:    Tacrolimus 6 mg bid  Pt repeated back new dose and had no questions.    Pt also at request of Adline Peals, DNP asked to increase fluid intake to assist home health staff for lab draws. Pt states she is drinking at least 120 cc fluid per day and that she cannot take in any more than that. She also states that even while an inpatient at [facility] the IV Team had to use ultrasound to access her.

## 2018-01-29 LAB — TACROLIMUS LEVEL, TROUGH: TACROLIMUS, TROUGH: 6.9 ng/mL (ref 5.0–15.0)

## 2018-01-29 LAB — TACROLIMUS, TROUGH: Lab: 6.9

## 2018-01-31 DIAGNOSIS — J449 Chronic obstructive pulmonary disease, unspecified: Secondary | ICD-10-CM

## 2018-01-31 DIAGNOSIS — N186 End stage renal disease: Secondary | ICD-10-CM

## 2018-01-31 DIAGNOSIS — M170 Bilateral primary osteoarthritis of knee: Secondary | ICD-10-CM

## 2018-01-31 DIAGNOSIS — Z79899 Other long term (current) drug therapy: Secondary | ICD-10-CM

## 2018-01-31 DIAGNOSIS — G894 Chronic pain syndrome: Secondary | ICD-10-CM

## 2018-01-31 DIAGNOSIS — Z87891 Personal history of nicotine dependence: Secondary | ICD-10-CM

## 2018-01-31 DIAGNOSIS — Z5181 Encounter for therapeutic drug level monitoring: Secondary | ICD-10-CM

## 2018-01-31 DIAGNOSIS — B9689 Other specified bacterial agents as the cause of diseases classified elsewhere: Secondary | ICD-10-CM

## 2018-01-31 DIAGNOSIS — I132 Hypertensive heart and chronic kidney disease with heart failure and with stage 5 chronic kidney disease, or end stage renal disease: Secondary | ICD-10-CM

## 2018-01-31 DIAGNOSIS — B9561 Methicillin susceptible Staphylococcus aureus infection as the cause of diseases classified elsewhere: Secondary | ICD-10-CM

## 2018-01-31 DIAGNOSIS — T8613 Kidney transplant infection: Principal | ICD-10-CM

## 2018-01-31 DIAGNOSIS — R7881 Bacteremia: Secondary | ICD-10-CM

## 2018-01-31 DIAGNOSIS — M792 Neuralgia and neuritis, unspecified: Secondary | ICD-10-CM

## 2018-01-31 DIAGNOSIS — M160 Bilateral primary osteoarthritis of hip: Secondary | ICD-10-CM

## 2018-01-31 DIAGNOSIS — I5032 Chronic diastolic (congestive) heart failure: Secondary | ICD-10-CM

## 2018-01-31 NOTE — Unmapped (Signed)
Call received from [facility] nurse Elease Hashimoto at [phone].  They were unable to get the pts labs again today.  They asked about the pt going to the local [facility] as she has transportation vouchers so she can get there.  Orders will be entered and she can go in the am.  HH will continue to see her weekly for her medication management assistance.

## 2018-02-01 ENCOUNTER — Ambulatory Visit
Admission: RE | Admit: 2018-02-01 | Discharge: 2018-02-01 | Disposition: A | Discharge status: Home / Self Care | Payer: Medicare Other | Source: Ambulatory Visit | Attending: Family Medicine | Admitting: Family Medicine

## 2018-02-01 DIAGNOSIS — R928 Other abnormal and inconclusive findings on diagnostic imaging of breast: Secondary | ICD-10-CM

## 2018-02-01 DIAGNOSIS — R921 Mammographic calcification found on diagnostic imaging of breast: Secondary | ICD-10-CM | POA: Diagnosis present

## 2018-02-01 LAB — COMPREHENSIVE METABOLIC PANEL
ALKALINE PHOSPHATASE: 100 U/L
ALT (SGPT): 34 U/L
AST (SGOT): 33 U/L
BLOOD UREA NITROGEN: 33 mg/dL — ABNORMAL HIGH
CALCIUM: 11.4 mg/dL — ABNORMAL HIGH
CHLORIDE: 102 mmol/L
CO2: 23 mmol/L
CREATININE: 1.63 mg/dL — ABNORMAL HIGH
EGFR MDRD AF AMER: 39 mL/min/{1.73_m2} — ABNORMAL HIGH
EGFR MDRD NON AF AMER: 33 mL/min/{1.73_m2} — ABNORMAL HIGH
POTASSIUM: 5.3 mmol/L — ABNORMAL HIGH
PROTEIN TOTAL: 9.8 g/dL — ABNORMAL HIGH
SODIUM: 137 mmol/L

## 2018-02-01 LAB — CBC W/ DIFFERENTIAL
BASOPHILS ABSOLUTE COUNT: 0 10*9/L
BASOPHILS RELATIVE PERCENT: 0 %
EOSINOPHILS ABSOLUTE COUNT: 0 10*9/L
EOSINOPHILS RELATIVE PERCENT: 0 %
HEMATOCRIT: 41.7 %
HEMOGLOBIN: 13.5 g/dL
LYMPHOCYTES RELATIVE PERCENT: 1 %
MEAN CORPUSCULAR HEMOGLOBIN CONC: 32.3 g/dL
MEAN CORPUSCULAR HEMOGLOBIN: 29.4 pg
MEAN CORPUSCULAR VOLUME: 91.1 fL
MONOCYTES ABSOLUTE COUNT: 0.3 10*9/L
MONOCYTES RELATIVE PERCENT: 4 %
NEUTROPHILS RELATIVE PERCENT: 95 %
PLATELET COUNT: 273 10*9/L
RED CELL DISTRIBUTION WIDTH: 19.8 % — ABNORMAL HIGH
WHITE BLOOD CELL COUNT: 11.1 10*9/L

## 2018-02-01 LAB — BLOOD UREA NITROGEN: Lab: 33 — ABNORMAL HIGH

## 2018-02-01 LAB — NUCLEATED RED BLOOD CELLS: Lab: 0

## 2018-02-01 LAB — TACROLIMUS, TROUGH: Lab: 22.2 — ABNORMAL HIGH

## 2018-02-01 LAB — LIPASE: Lab: 21

## 2018-02-01 NOTE — Unmapped (Signed)
After consulting with primary coord, change of plans. I called pt. Back and told her NOT to take meds at 9 am. To instead hold, then get labs later this morning (11 am). Take meds after labs drawn. Then take tonight's med closer to 11 pm. Tomorrow she can resume her normal schedule. She verbalized understanding and agreed to these plans.

## 2018-02-01 NOTE — Unmapped (Signed)
Call from pt. @ [phone], states she is to go to [facility] today for labs but the transportation Zenaida Niece cannot take her until 11 am. She is due to take meds at 9 am. She asked what to do. I told her to take meds, as scheduled at 9 am, then proceed to lab after 11 am. I'll inform primary coord that her drug level will not be a trough. She verbalized understanding and will do so.

## 2018-02-02 LAB — CBC W/ DIFFERENTIAL
BANDED NEUTROPHILS ABSOLUTE COUNT: 0 10*3/uL (ref 0.0–0.1)
BASOPHILS ABSOLUTE COUNT: 0.1 10*3/uL (ref 0.0–0.2)
BASOPHILS RELATIVE PERCENT: 2 %
EOSINOPHILS RELATIVE PERCENT: 9 %
HEMATOCRIT: 35.6 % (ref 34.0–46.6)
HEMOGLOBIN: 11.4 g/dL (ref 11.1–15.9)
IMMATURE GRANULOCYTES: 1 %
LYMPHOCYTES ABSOLUTE COUNT: 0.2 10*3/uL — ABNORMAL LOW (ref 0.7–3.1)
LYMPHOCYTES RELATIVE PERCENT: 7 %
MEAN CORPUSCULAR HEMOGLOBIN CONC: 32 g/dL (ref 31.5–35.7)
MEAN CORPUSCULAR HEMOGLOBIN: 28.9 pg (ref 26.6–33.0)
MEAN CORPUSCULAR VOLUME: 90 fL (ref 79–97)
MONOCYTES ABSOLUTE COUNT: 0.2 10*3/uL (ref 0.1–0.9)
MONOCYTES RELATIVE PERCENT: 5 %
NEUTROPHILS RELATIVE PERCENT: 76 %
PLATELET COUNT: 268 10*3/uL (ref 150–450)
RED BLOOD CELL COUNT: 3.95 x10E6/uL (ref 3.77–5.28)
RED CELL DISTRIBUTION WIDTH: 19.4 % — ABNORMAL HIGH (ref 12.3–15.4)
WHITE BLOOD CELL COUNT: 3.3 10*3/uL — ABNORMAL LOW (ref 3.4–10.8)

## 2018-02-02 LAB — MAGNESIUM: Lab: 1.8

## 2018-02-02 LAB — RENAL FUNCTION PANEL
ALBUMIN: 4.2 g/dL (ref 3.5–5.5)
BUN / CREAT RATIO: 15 (ref 9–23)
CALCIUM: 9.9 mg/dL (ref 8.7–10.2)
CHLORIDE: 107 mmol/L — ABNORMAL HIGH (ref 96–106)
CO2: 16 mmol/L — ABNORMAL LOW (ref 20–29)
CREATININE: 1.24 mg/dL — ABNORMAL HIGH (ref 0.57–1.00)
GFR MDRD AF AMER: 55 mL/min/{1.73_m2} — ABNORMAL LOW
GFR MDRD NON AF AMER: 48 mL/min/{1.73_m2} — ABNORMAL LOW
GLUCOSE: 94 mg/dL (ref 65–99)
POTASSIUM: 5.1 mmol/L (ref 3.5–5.2)

## 2018-02-02 LAB — EOSINOPHILS ABSOLUTE COUNT: Lab: 0.3

## 2018-02-02 LAB — GLUCOSE: Lab: 94

## 2018-02-02 NOTE — Unmapped (Signed)
TRANSPLANT SURGERY PROGRESS NOTE    Assessment and Plan  Danielle Parker is a 57 y.o. female who underwent deceased donor renal transplant on 01/08/2018. She presents today for outpatient follow up. Dr. Norma Fredrickson reviewed case and to see patient in clinic today as well, without concerns.    She underwent premature ureteral stent removal and has not experienced any recurrence of suprapubic/abdominal pain and/or nausea she had previously had. No recent calls to EMS or ED visit.  From an overall graft perspective, patient doing well, making good urine with baseline creatine now 1.24. Urine today without infection s/p cystoscopy.    She unfortunately did not get labs today prior to visit and had taken IS. Last rough 7.1 from 6/24 with goal (8-10). Will review next trough. Increase valcyte based on improving creatine. See transplant pharmacy note for additional changes made.    BP today again improving, will decrease hydralazine to 25mg  TID.    Follow up: staple removal 7/9, then first nephrology visit  Labs: continue 3x/week    Subjective  Danielle Parker is a 57 y.o. female who underwent??renal transplantation??on 01/08/2018. Her initial post-operative course was straightforward and she was discharged on POD 6. PMH additionally significant for COPD (home O2 2L [place] PRN), chronic pain, GERD. Notably, she was treated with ciprofloxacin for donor MSSA and enterobacter in lungs during hospital stay.     She presented to the [facility] ED 6/12 with nausea and lower abdominal pain x3 days. She, unknowingly to the team, was taken via EMS to OSH twice prior for similar complaints and underwent a CT scan with unremarkable findings/no fluid collection. Workup was not concerning for infection other than small leukocyte esterase in urine, culture unremarkable. Presentation was consistent with constipation and worsening of GERD symptoms, given that she has been off of PPI as well (cost issue per patient), she underwent SMOG and GI cocktail (no BMx4 days prior and KUB shows stool burden) with symptom improvement. After follow up in clinic it was identified that her abdominal pain was actually suprapubic in nature. Her ureteral stent was in place but she underwent stent removal 6/20 and has not had recurrence of aforementioned symptoms.     Today she feels 100%. She denies any N/V/D/C, no significant tremor or HA, no abdominal pain. She is adequately hydrating and making good urine. Incision without issue. Not using any supplemental O2.    Current Outpatient Medications   Medication Sig Dispense Refill   ??? sulfamethoxazole-trimethoprim (BACTRIM,SEPTRA) 400-80 mg per tablet Take 1 tablet (80 mg of trimethoprim total) by mouth 3 (three) times a week. 12 tablet 5   ??? acetaminophen (TYLENOL) 500 MG tablet TAKE 1 TO 2 TABLETS (500-1000MG ) BY MOUTH THREE TIMES DAILY AS NEEDED (Patient taking differently: TAKE 1 TO 2 TABLETS (500-1000MG ) BY MOUTH THREE TIMES DAILY AS NEEDED FOR PAIN) 100 tablet PRN   ??? albuterol (PROVENTIL HFA;VENTOLIN HFA) 90 mcg/actuation inhaler Inhale 2 puffs daily as needed for shortness of breath.      ??? amLODIPine (NORVASC) 10 MG tablet Take 1 tablet (10 mg total) by mouth daily. Use only as directed. 30 tablet 11   ??? aspirin (ECOTRIN) 81 MG tablet Take 81 mg by mouth daily.     ??? docusate sodium (COLACE) 100 MG capsule Take 1 capsule (100 mg total) by mouth two (2) times a day as needed for constipation. 60 capsule 0   ??? gabapentin (NEURONTIN) 300 MG capsule Take 300 mg by mouth nightly.     ???  hydrALAZINE (APRESOLINE) 25 MG tablet Take 1 tablet (25 mg total) by mouth Three (3) times a day. 90 tablet 0   ??? metoprolol tartrate (LOPRESSOR) 25 MG tablet Take 3 tablets (75 mg total) by mouth Two (2) times a day. 180 tablet 11   ??? MYFORTIC 180 mg EC tablet Take 3 tablets (540 mg total) by mouth Two (2) times a day. 180 tablet 11   ??? omeprazole (PRILOSEC) 40 MG capsule Take 1 capsule (40 mg total) by mouth daily. 90 capsule 3   ??? OXYGEN-AIR DELIVERY SYSTEMS MISC Inhale 3 L continuous.     ??? polyethylene glycol (MIRALAX) 17 gram packet Take 17 g by mouth daily as needed. (Patient taking differently: Take 17 g by mouth daily as needed. Indications: constipation) 30 each 0   ??? PROGRAF 1 mg capsule Take 6 capsules (6 mg total) by mouth two (2) times a day. Take 6 mg every morning and 5 mg every evening 360 capsule 11   ??? rosuvastatin (CRESTOR) 5 MG tablet Take 5 mg by mouth daily.     ??? sodium bicarbonate 650 mg tablet Take 1 tablet (648 mg total) by mouth Two (2) times a day. 60 tablet 11   ??? valGANciclovir (VALCYTE) 450 mg tablet Take 1 tablet (450 mg total) by mouth daily. 30 tablet 1     No current facility-administered medications for this visit.      Objective    Vitals:    02/03/18 0856   BP: 128/75   Pulse: 64   Temp: 36.2 ??C (97.2 ??F)   TempSrc: Tympanic   Weight: 77 kg (169 lb 11.2 oz)   Height: 160 cm (5' 3)      Body mass index is 30.06 kg/m??.     Physical Exam:    General Appearance:    No acute distress, wearing makeup!  Lungs:                 Clear to auscultation bilaterally  Heart:                            Regular rate and rhythm  Abdomen:                 Soft, round, non-tender, incision C/D/I extends to low pubic area. JP drain exit site closed, no appreciated swelling to abdomen.   Extremities:               Warm and well perfused, no edema.    Data Review:  All lab results last 24 hours reviewed    Imaging: none today    _________________________________________________    Gertie Fey, DNP, APRN, FNP-C  [facility] for [facility]  [address]  [place], [place]  [address]

## 2018-02-03 ENCOUNTER — Encounter: Admit: 2018-02-03 | Discharge: 2018-02-03 | Payer: 59

## 2018-02-03 ENCOUNTER — Encounter
Admit: 2018-02-03 | Discharge: 2018-02-03 | Payer: 59 | Attending: Pharmacist Clinician (PhC)/ Clinical Pharmacy Specialist | Primary: Pharmacist Clinician (PhC)/ Clinical Pharmacy Specialist

## 2018-02-03 DIAGNOSIS — Z09 Encounter for follow-up examination after completed treatment for conditions other than malignant neoplasm: Principal | ICD-10-CM

## 2018-02-03 DIAGNOSIS — Z79899 Other long term (current) drug therapy: Secondary | ICD-10-CM

## 2018-02-03 DIAGNOSIS — Z940 Kidney transplant status: Secondary | ICD-10-CM

## 2018-02-03 DIAGNOSIS — Z9889 Other specified postprocedural states: Secondary | ICD-10-CM

## 2018-02-03 LAB — URINALYSIS
BILIRUBIN UA: NEGATIVE
BLOOD UA: NEGATIVE
GLUCOSE UA: NEGATIVE
KETONES UA: NEGATIVE
NITRITE UA: NEGATIVE
PH UA: 5 (ref 5.0–9.0)
RBC UA: 2 /HPF (ref <=4)
SPECIFIC GRAVITY UA: 1.016 (ref 1.003–1.030)
SQUAMOUS EPITHELIAL: 16 /HPF — ABNORMAL HIGH (ref 0–5)
WBC UA: 14 /HPF — ABNORMAL HIGH (ref 0–5)

## 2018-02-03 LAB — PROTEIN/CREAT RATIO, URINE: Protein/Creatinine:MRto:Pt:Urine:Qn:: 0.179

## 2018-02-03 LAB — BLOOD UA: Lab: NEGATIVE

## 2018-02-03 LAB — TACROLIMUS BLOOD: Lab: 7.1

## 2018-02-03 MED ORDER — SODIUM BICARBONATE 650 MG TABLET
ORAL_TABLET | Freq: Two times a day (BID) | ORAL | 11 refills | 0 days | Status: CP
Start: 2018-02-03 — End: 2018-05-30

## 2018-02-03 MED ORDER — HYDRALAZINE 25 MG TABLET
ORAL_TABLET | Freq: Three times a day (TID) | ORAL | 0 refills | 0 days
Start: 2018-02-03 — End: 2018-03-31

## 2018-02-03 MED ORDER — VALGANCICLOVIR 450 MG TABLET
ORAL_TABLET | Freq: Every day | ORAL | 1 refills | 0.00000 days | Status: CP
Start: 2018-02-03 — End: 2018-03-04

## 2018-02-03 MED ORDER — VALGANCICLOVIR 450 MG TABLET: tablet | 1 refills | 0 days

## 2018-02-03 NOTE — Unmapped (Signed)
[facility] TRANSPLANT CLINIC PHARMACY NOTE  02/03/2018   Danielle Parker  [id number]    Medication changes today:   1. Decrease tacrolimus to 6 mg qAM and 5 mg qPM  2. Decrease hydralazine to 25 mg TID   3. Change Myfortic to 540 mg BID  4. Start sodium bicarbonate 650 mg BID     Education/Adherence tools provided today:  1.provided updated medication list  2. provided additional pill box education  3.  provided additional education on immunosuppression and transplant related medications including reviewing indications of medications, dosing and side effects    Follow up items:  1. goal of understanding indications and dosing of immunosuppression medications  2. BP and possibility of stopping hydralazine  3. Tacrolimus levels  4. CO2 and need for sodium bicarbonate discontinuation/adjustment  5.  Tolerability of BID Myfortic  6.  Consider increasing rosuvastatin to 10 mg daily  7.  Vitamin D level   8.  Pill box for accuracy    Next visit with pharmacy in 1-3 months  ____________________________________________________________________    Danielle Parker is a 57 y.o. 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     Other PMH significant for hypertension    Seen by pharmacy today for: medication management and pill box fill and adherence education; last seen by pharmacy first visit     CC:  Patient has no complaints today     There were no vitals filed for this visit.    Allergies   Allergen Reactions   ??? Buprenorphine Hcl Nausea And Vomiting   ??? Codeine Nausea And Vomiting   ??? Hydrocodone-Acetaminophen Nausea And Vomiting   ??? Morphine Nausea And Vomiting     Halllucinations       All medications reviewed and updated.     Medication list includes revisions made during today???s encounter    Outpatient Encounter Medications as of 02/03/2018   Medication Sig Dispense Refill   ??? acetaminophen (TYLENOL) 500 MG tablet TAKE 1 TO 2 TABLETS (500-1000MG ) BY MOUTH THREE TIMES DAILY AS NEEDED (Patient taking differently: TAKE 1 TO 2 TABLETS (500-1000MG ) BY MOUTH THREE TIMES DAILY AS NEEDED FOR PAIN) 100 tablet PRN   ??? albuterol (PROVENTIL HFA;VENTOLIN HFA) 90 mcg/actuation inhaler Inhale 2 puffs daily as needed for shortness of breath.      ??? amLODIPine (NORVASC) 10 MG tablet Take 1 tablet (10 mg total) by mouth daily. Use only as directed. 30 tablet 11   ??? aspirin (ECOTRIN) 81 MG tablet Take 81 mg by mouth daily.     ??? docusate sodium (COLACE) 100 MG capsule Take 1 capsule (100 mg total) by mouth two (2) times a day as needed for constipation. 60 capsule 0   ??? gabapentin (NEURONTIN) 300 MG capsule Take 300 mg by mouth nightly.     ??? hydrALAZINE (APRESOLINE) 25 MG tablet Take 2 tablets (50 mg total) by mouth Three (3) times a day. 180 tablet 0   ??? magnesium oxide-Mg AA chelate (MAGNESIUM, AMINO ACID CHELATE,) 133 mg Tab Take 1 tablet by mouth Two (2) times a day. HOLD until directed to start by your coordinator. (Patient not taking: Reported on 02/03/2018) 60 tablet 11   ??? metoprolol tartrate (LOPRESSOR) 25 MG tablet Take 3 tablets (75 mg total) by mouth Two (2) times a day. 180 tablet 11   ??? MYFORTIC 180 mg EC tablet Take 3 tablets (540 mg total) by mouth Two (2) times a day. 180 tablet 11   ???  omeprazole (PRILOSEC) 40 MG capsule Take 1 capsule (40 mg total) by mouth daily. 90 capsule 3   ??? oxyCODONE-acetaminophen (PERCOCET) 10-325 mg per tablet HOLD. Do not take while on Tramadol. 30 tablet 0   ??? OXYGEN-AIR DELIVERY SYSTEMS MISC Inhale 3 L continuous.     ??? polyethylene glycol (MIRALAX) 17 gram packet Take 17 g by mouth daily as needed. (Patient taking differently: Take 17 g by mouth daily as needed. Indications: constipation) 30 each 0   ??? PROGRAF 1 mg capsule Take 6 capsules (6 mg total) by mouth two (2) times a day. Take 6 mg every morning and 5 mg every evening 360 capsule 11   ??? rosuvastatin (CRESTOR) 5 MG tablet Take 5 mg by mouth daily.     ??? sulfamethoxazole-trimethoprim (BACTRIM,SEPTRA) 400-80 mg per tablet Take 1 tablet (80 mg of trimethoprim total) by mouth 3 (three) times a week. 12 tablet 5   ??? valGANciclovir (VALCYTE) 450 mg tablet Take 1 tablet (450 mg total) by mouth every other day. 30 tablet 2     No facility-administered encounter medications on file as of 02/03/2018.        Induction agent : alemtuzumab    CURRENT IMMUNOSUPPRESSION: tacrolimus 8 mg PO BID  prograf/cyclosporine goal: 8-10   myfortic360  mg PO tid (had spaced out dose last visit for abdominal pain)   steroid free     Patient is tolerating immunosuppression well. Upon abdominal exam may be due to dislodged ureteral stent rather than drug induced    IMMUNOSUPPRESSION DRUG LEVELS:  Lab Results   Component Value Date    TACROLIMUS 10.8 02/04/2018    TACROLIMUS 7.1 02/01/2018    TACROLIMUS 6.9 01/28/2018    TACROLIMUS 6.0 01/26/2018    TACROLIMUS 6.9 01/24/2018     No results found for: CYCLO  No results found for: EVEROLIMUS  No results found for: SIROLIMUS    Did not have labs drawn today    Graft function: stable  DSA: ntd  Biopsies to date: ntd  WBC/ANC:  wnl    Plan: Will decrease tacrolimus to 6 mg qAM and 5 mg qPM.  Adjust Myfortic to 540 mg BID. Continue to monitor.    ID prophylaxis:   CMV Status: D-/ R+, moderate risk. CMV prophylaxis: valganciclovir 450 mg every other day x 3 months per protocol (end 04/10/18)  No results found for: CMVCP  PCP Prophylaxis: bactrim SS 1 tab MWF x 6 months. (end 07/10/18)  Thrush: completed in hospital  Patient is  tolerating infectious prophylaxis well    Plan: Adjust Valcyte to 450 mg daily. Continue to monitor.    CV Prophylaxis: asa 81 mg   The 10-year ASCVD risk score Denman George DC Jr., et al., 2013) is: 15.8%  Statin therapy: Indicated; currently on rosuvastatin 5 mg daily  Plan: Consider uptitrating dose at later date to 10 mg. Continue to monitor     BP: Goal < 140/90. Clinic vitals reported above  Home BP ranges: 120-125/70s-80s per patient.  Current meds include: amlodipine 10 mg daily, metoprolol 75 mg BID, hydralazine 50 mg TID  Plan: within goal. Decrease hydralazine to 25 mg TID and consider stopping at next visit. Continue to monitor    Anemia:  H/H:   Lab Results   Component Value Date    HGB 11.4 02/01/2018     Lab Results   Component Value Date    HCT 35.6 02/01/2018     Iron panel:  No results found  for: IRON, TIBC, FERRITIN  No results found for: LABIRON    Prior ESA use: none post transplant    Plan: within goal. Continue to monitor.     DM:   Lab Results   Component Value Date    A1C 5.1 01/07/2018   . Goal A1c < 7  History of Dm? No  Diet: Did not address at this visit  Fluid intake: 64 oz water daily  Exercise:not currently exercising  Plan: Increase water intake to 80-100 oz.  Continue to monitor    Electrolytes: Phos 2.4, calcium 10.5  Meds currently on: none  Plan: Continue to monitor     GI/BM: pt reports abdominal pain has improved with restarting omeprazole and removal or ureteral stent last week   Meds currently on: Miralax PRN (has not been using), docusate BID (taking once daily), omeprazole 40 mg daily  Plan: Continue to monitor    Pain: pt reports moderate pain at incision site   Meds currently on: APAP 1g PRN (takes occasionally but reports ineffective), tramadol 50 mg TID PRN (takes ~1 daily); Percocet 10 PRN for back pain from pain specialist (takes 1/2 tab BID); gabapentin 300 mg HS for neuropathy  Plan: Instructed patient to only take Percocet for severe pain and not to take at same time as tramadol.  Can likely DC tramadol at next visit. Continue to monitor    Bone health:   Vitamin D Level: none available. Goal > 30.   Last DEXA results:  none available  Current meds include: none  Plan: Vitamin D level  needs to be drawn with next lab schedule.  Continue to monitor.     Women's/Men's Health:  Danielle Parker is a 56 y.o. Female perimenopausal. Patient reports no men's/women's health issues  Plan: Continue to monitor    Adherence: Patient has poor understanding of medications; was able to identify tacrolimus and Bactrim indications but didn't know Myfortic or Valcyte.   Patient knows indications of medications that she was taking prior to transplant.  Patient does require redirection to remain on topic.  Patient  reports that she has started to  fill their own pill box on a regular basis at home. Per transplant coordinator, home health RN is there 3 times weekly and may help with fill  Patient brought medication card:yes  Pill IRJ:[id number] as patient brought the wrong pill box.  States that she uses the one provided by home health nurse.    Plan: Instructed Ms. Pellegrini to bring in all of her medicines and pill box that she uses so that pharmacist can review; provided extensive adherence counseling/intervention    Spent approximately 30 minutes on educating this patient and greater than 50% was spent in direct face to face counseling regarding post transplant medication education. Questions and concerns were address to patient's satisfaction.    Patient was reviewed with Dr. Sherre Scarlet DNP who was agreement with the stated plan:     During this visit, the following was completed:   BG log data assessment  BP log data assessment  Labs ordered and evaluated  complex treatment plan >1 DS   Patient education was completed for 11-24 minutes     All questions/concerns were addressed to the patient's satisfaction.  __________________________________________  Cleone Slim, PHARMD  [facility]  PAGER [phone]

## 2018-02-04 DIAGNOSIS — Z5181 Encounter for therapeutic drug level monitoring: Secondary | ICD-10-CM

## 2018-02-04 DIAGNOSIS — I132 Hypertensive heart and chronic kidney disease with heart failure and with stage 5 chronic kidney disease, or end stage renal disease: Secondary | ICD-10-CM

## 2018-02-04 DIAGNOSIS — G894 Chronic pain syndrome: Secondary | ICD-10-CM

## 2018-02-04 DIAGNOSIS — Z87891 Personal history of nicotine dependence: Secondary | ICD-10-CM

## 2018-02-04 DIAGNOSIS — B9561 Methicillin susceptible Staphylococcus aureus infection as the cause of diseases classified elsewhere: Secondary | ICD-10-CM

## 2018-02-04 DIAGNOSIS — T8613 Kidney transplant infection: Principal | ICD-10-CM

## 2018-02-04 DIAGNOSIS — M160 Bilateral primary osteoarthritis of hip: Secondary | ICD-10-CM

## 2018-02-04 DIAGNOSIS — J449 Chronic obstructive pulmonary disease, unspecified: Secondary | ICD-10-CM

## 2018-02-04 DIAGNOSIS — Z79899 Other long term (current) drug therapy: Secondary | ICD-10-CM

## 2018-02-04 DIAGNOSIS — R7881 Bacteremia: Secondary | ICD-10-CM

## 2018-02-04 DIAGNOSIS — M170 Bilateral primary osteoarthritis of knee: Secondary | ICD-10-CM

## 2018-02-04 DIAGNOSIS — B9689 Other specified bacterial agents as the cause of diseases classified elsewhere: Secondary | ICD-10-CM

## 2018-02-04 DIAGNOSIS — I5032 Chronic diastolic (congestive) heart failure: Secondary | ICD-10-CM

## 2018-02-04 DIAGNOSIS — M792 Neuralgia and neuritis, unspecified: Secondary | ICD-10-CM

## 2018-02-04 DIAGNOSIS — N186 End stage renal disease: Secondary | ICD-10-CM

## 2018-02-04 LAB — CBC W/ DIFFERENTIAL
BANDED NEUTROPHILS ABSOLUTE COUNT: 0 10*3/uL (ref 0.0–0.1)
BASOPHILS ABSOLUTE COUNT: 0.1 10*3/uL (ref 0.0–0.2)
BASOPHILS RELATIVE PERCENT: 2 %
EOSINOPHILS ABSOLUTE COUNT: 0.3 10*3/uL (ref 0.0–0.4)
HEMATOCRIT: 35.4 % (ref 34.0–46.6)
HEMOGLOBIN: 11.8 g/dL (ref 11.1–15.9)
IMMATURE GRANULOCYTES: 1 %
LYMPHOCYTES RELATIVE PERCENT: 5 %
MEAN CORPUSCULAR HEMOGLOBIN CONC: 33.3 g/dL (ref 31.5–35.7)
MEAN CORPUSCULAR HEMOGLOBIN: 29.3 pg (ref 26.6–33.0)
MEAN CORPUSCULAR VOLUME: 88 fL (ref 79–97)
MONOCYTES ABSOLUTE COUNT: 0.3 10*3/uL (ref 0.1–0.9)
MONOCYTES RELATIVE PERCENT: 7 %
NEUTROPHILS ABSOLUTE COUNT: 3 10*3/uL (ref 1.4–7.0)
NEUTROPHILS RELATIVE PERCENT: 78 %
PLATELET COUNT: 352 10*3/uL (ref 150–450)
RED BLOOD CELL COUNT: 4.03 x10E6/uL (ref 3.77–5.28)
RED CELL DISTRIBUTION WIDTH: 19.1 % — ABNORMAL HIGH (ref 12.3–15.4)
WHITE BLOOD CELL COUNT: 3.8 10*3/uL (ref 3.4–10.8)

## 2018-02-04 LAB — IMMATURE GRANULOCYTES: Lab: 1

## 2018-02-04 MED ORDER — PROGRAF 1 MG CAPSULE: capsule | 11 refills | 0 days

## 2018-02-04 MED ORDER — PROGRAF 1 MG CAPSULE
ORAL_CAPSULE | ORAL | 11 refills | 0.00000 days | Status: CP
Start: 2018-02-04 — End: 2018-02-14

## 2018-02-04 MED FILL — VALGANCICLOVIR/450MG/TABS: VALGANCICLOVIR/450MG/TABS | 30 days supply | Qty: 30 | Fill #0

## 2018-02-04 MED FILL — PROGRAF/1MG/CAP: PROGRAF/1MG/CAP | 30 days supply | Qty: 330 | Fill #0

## 2018-02-04 NOTE — Unmapped (Addendum)
[facility]   Patient Onboarding/Medication Counseling    Danielle Parker is a 57 y.o. female with kidney transplant who I am counseling today on initiation of therapy.    Medication: Valcyte    Verified patient's date of birth / HIPAA.      Education Provided: ??    Dose/Administration discussed: patient declined counseling. This medication should be taken  patient declined counseling..  Stressed the importance of taking medication as prescribed and to contact provider if that changes at any time.  Discussed missed dose instructions.    Storage requirements: this medicine should be stored patient declined counseling.     Side effects / precautions discussed: Discussed common side effects, including patient declined counseling. If patient experiences patient declined counseling., they need to call the doctor.  Patient will receive a drug information handout with shipment.    Handling precautions / disposal reviewed:  patient declined counseling..    Drug Interactions: other medications reviewed and up to date in Epic.  patient declined counseling..    Comorbidities/Allergies: reviewed and up to date in Epic.    Verified therapy is appropriate and should continue      Delivery Information    Medication Assistance provided: none    Anticipated copay of $ reviewed with patient. Verified delivery address in FSI and reviewed medication storage requirement.    Scheduled delivery date: 02/04/2017 (if prescriptions are sent over)    Explained that we ship using UPS or courier and this shipment will require a signature.      Explained the services we provide at [facility] and that each month we would call to set up refills.  Stressed importance of returning phone calls so that we could ensure they receive their medications in time each month.  Informed patient that we should be setting up refills 7-10 days prior to when they will run out of medication.  Informed patient that welcome packet will be sent.      Patient verbalized understanding of the above information as well as how to contact the pharmacy at [phone] option 4 with any questions/concerns.  The pharmacy is open Monday through Friday 8:30am-4:30pm.  A pharmacist is available 24/7 via pager to answer any clinical questions they may have.        Patient Specific Needs      ? Patient has no physical, cognitive, or cultural barriers.    ? Patient prefers to have medications discussed with  Patient     ? Patient is able to read and understand education materials at a high school level or above.    ? Patient's primary language is  English           Danielle Parker [facility] Specialty Pharmacist      [facility] Refill and Clinical Coordination Note  Medication(s): Prograf 1mg , Valganciclovir 450mg , and myfortic 360mg     Danielle Parker, DOB: 1961/05/04  Phone: [phone] (home) , Alternate phone contact: N/A  Shipping address: [address]  [place] [address]  Phone or address changes today?: No  All above HIPAA information verified.  Insurance changes? No    Completed refill and clinical call assessment today to schedule patient's medication shipment from the [facility] [phone]).      MEDICATION RECONCILIATION    Confirmed the medication and dosage are correct and have not changed: No, patient reports changes to the regimen as follows: Prograf is  now 6QAM and 6 QPM, Danielle Parker is now 1 daily    Were there any changes to your medication(s) in the past month:  No, there are no changes reported at this time.    ADHERENCE    Is this medicine transplant or covered by Medicare Part B? Yes.    Prograf 1 mg   Quantity filled last month: 480   # of tablets left on hand: 3 days        Did you miss any doses in the past 4 weeks? No missed doses reported.  Adherence counseling provided? Not needed     SIDE EFFECT MANAGEMENT    Are you tolerating your medication?:  Danielle Parker reports tolerating the medication.  Side effect management discussed: None      Therapy is appropriate and should be continued.    Evidence of clinical benefit: See Epic note from 02/03/2018      FINANCIAL/SHIPPING    Delivery Scheduled: Yes, Expected medication delivery date: 02/04/2018.  However, Rx request for new prescription was sent to the provider as the patient reports a change in dose.   Additional medications refilled: bactrim- TOO SOON.  Filled at [facility] pharmacy so will not send      The patient will receive an FSI print out for each medication shipped and additional FDA Medication Guides as required.  Patient education from [place] or Robet Leu may also be included in the shipment.    Danielle Parker did not have any additional questions at this time.    Delivery address validated in FSI scheduling system: Yes, address listed above is correct.      We will follow up with patient monthly for standard refill processing and delivery.      Thank you,  Danielle Parker [facility] Specialty Pharmacist

## 2018-02-05 LAB — RENAL FUNCTION PANEL
ALBUMIN: 4.4 g/dL (ref 3.5–5.5)
BLOOD UREA NITROGEN: 16 mg/dL (ref 6–24)
BUN / CREAT RATIO: 11 (ref 9–23)
CALCIUM: 10.1 mg/dL (ref 8.7–10.2)
CHLORIDE: 107 mmol/L — ABNORMAL HIGH (ref 96–106)
CO2: 18 mmol/L — ABNORMAL LOW (ref 20–29)
CREATININE: 1.42 mg/dL — ABNORMAL HIGH (ref 0.57–1.00)
GFR MDRD AF AMER: 47 mL/min/{1.73_m2} — ABNORMAL LOW
GLUCOSE: 117 mg/dL — ABNORMAL HIGH (ref 65–99)
PHOSPHORUS, SERUM: 2.3 mg/dL — ABNORMAL LOW (ref 2.5–4.5)
POTASSIUM: 4.9 mmol/L (ref 3.5–5.2)
SODIUM: 139 mmol/L (ref 134–144)

## 2018-02-05 LAB — MAGNESIUM: Lab: 1.6

## 2018-02-05 LAB — SODIUM: Lab: 139

## 2018-02-07 HISTORY — PX: BREAST BIOPSY: SHX20

## 2018-02-07 LAB — CBC W/ DIFFERENTIAL
BASOPHILS ABSOLUTE COUNT: 0.1 10*3/uL (ref 0.0–0.2)
BASOPHILS RELATIVE PERCENT: 2 %
EOSINOPHILS ABSOLUTE COUNT: 0.1 10*3/uL (ref 0.0–0.4)
EOSINOPHILS RELATIVE PERCENT: 2 %
HEMOGLOBIN: 11.5 g/dL (ref 11.1–15.9)
IMMATURE GRANULOCYTES: 0 %
LYMPHOCYTES ABSOLUTE COUNT: 0.2 10*3/uL — ABNORMAL LOW (ref 0.7–3.1)
LYMPHOCYTES RELATIVE PERCENT: 4 %
MEAN CORPUSCULAR HEMOGLOBIN CONC: 33.3 g/dL (ref 31.5–35.7)
MEAN CORPUSCULAR HEMOGLOBIN: 28.8 pg (ref 26.6–33.0)
MEAN CORPUSCULAR VOLUME: 87 fL (ref 79–97)
MONOCYTES ABSOLUTE COUNT: 0.2 10*3/uL (ref 0.1–0.9)
MONOCYTES RELATIVE PERCENT: 4 %
NEUTROPHILS ABSOLUTE COUNT: 4.1 10*3/uL (ref 1.4–7.0)
PLATELET COUNT: 265 10*3/uL (ref 150–450)
RED BLOOD CELL COUNT: 3.99 x10E6/uL (ref 3.77–5.28)
RED CELL DISTRIBUTION WIDTH: 18.9 % — ABNORMAL HIGH (ref 12.3–15.4)
WHITE BLOOD CELL COUNT: 4.6 10*3/uL (ref 3.4–10.8)

## 2018-02-07 LAB — BASOPHILS ABSOLUTE COUNT: Lab: 0.1

## 2018-02-07 LAB — TACROLIMUS BLOOD: Lab: 10.8

## 2018-02-08 LAB — RENAL FUNCTION PANEL
ALBUMIN: 4.3 g/dL (ref 3.5–5.5)
BUN / CREAT RATIO: 11 (ref 9–23)
CALCIUM: 10.5 mg/dL — ABNORMAL HIGH (ref 8.7–10.2)
CHLORIDE: 107 mmol/L — ABNORMAL HIGH (ref 96–106)
CO2: 18 mmol/L — ABNORMAL LOW (ref 20–29)
GFR MDRD AF AMER: 50 mL/min/{1.73_m2} — ABNORMAL LOW
GFR MDRD NON AF AMER: 43 mL/min/{1.73_m2} — ABNORMAL LOW
GLUCOSE: 98 mg/dL (ref 65–99)
PHOSPHORUS, SERUM: 2.4 mg/dL — ABNORMAL LOW (ref 2.5–4.5)
POTASSIUM: 4.6 mmol/L (ref 3.5–5.2)
SODIUM: 140 mmol/L (ref 134–144)

## 2018-02-08 LAB — SODIUM: Lab: 140

## 2018-02-08 LAB — MAGNESIUM: Lab: 1.6

## 2018-02-08 MED ORDER — BUDESONIDE-FORMOTEROL HFA 80 MCG-4.5 MCG/ACTUATION AEROSOL INHALER
Freq: Two times a day (BID) | RESPIRATORY_TRACT | 11 refills | 0 days
Start: 2018-02-08 — End: 2018-03-08

## 2018-02-09 LAB — TACROLIMUS BLOOD: Lab: 10.9

## 2018-02-09 LAB — CBC W/ DIFFERENTIAL
BANDED NEUTROPHILS ABSOLUTE COUNT: 0 10*3/uL (ref 0.0–0.1)
BASOPHILS ABSOLUTE COUNT: 0 10*3/uL (ref 0.0–0.2)
BASOPHILS RELATIVE PERCENT: 1 %
EOSINOPHILS ABSOLUTE COUNT: 0.2 10*3/uL (ref 0.0–0.4)
EOSINOPHILS RELATIVE PERCENT: 4 %
HEMOGLOBIN: 11.2 g/dL (ref 11.1–15.9)
IMMATURE GRANULOCYTES: 0 %
LYMPHOCYTES ABSOLUTE COUNT: 0.2 10*3/uL — ABNORMAL LOW (ref 0.7–3.1)
MEAN CORPUSCULAR HEMOGLOBIN CONC: 33.3 g/dL (ref 31.5–35.7)
MEAN CORPUSCULAR HEMOGLOBIN: 28.9 pg (ref 26.6–33.0)
MEAN CORPUSCULAR VOLUME: 87 fL (ref 79–97)
MONOCYTES ABSOLUTE COUNT: 0.2 10*3/uL (ref 0.1–0.9)
MONOCYTES RELATIVE PERCENT: 5 %
NEUTROPHILS ABSOLUTE COUNT: 3.1 10*3/uL (ref 1.4–7.0)
NEUTROPHILS RELATIVE PERCENT: 85 %
PLATELET COUNT: 236 10*3/uL (ref 150–450)
RED BLOOD CELL COUNT: 3.87 x10E6/uL (ref 3.77–5.28)
RED CELL DISTRIBUTION WIDTH: 19 % — ABNORMAL HIGH (ref 12.3–15.4)
WHITE BLOOD CELL COUNT: 3.7 10*3/uL (ref 3.4–10.8)

## 2018-02-09 LAB — LYMPHOCYTES RELATIVE PERCENT: Lab: 5

## 2018-02-09 NOTE — Unmapped (Signed)
Patient called in to request Myfortic.  She will be out Saturday.  Sending sd courier Friday so she doesn't run out of medication.    [facility] Refill Coordination Note    Specialty Medication(s) to be Shipped:   Transplant: Myfortic 180mg     Other medication(s) to be shipped: n/a     Danielle Parker, DOB: March 23, 1961  Phone: [phone] (home)   Shipping Address: [address]  [place] [address]    All above HIPAA information was verified with patient.     Completed refill call assessment today to schedule patient's medication shipment from the [facility] [phone]).       Specialty medication(s) and dose(s) confirmed: Regimen is correct and unchanged.   Changes to medications: [name] reports no changes reported at this time.  Changes to insurance: No  Questions for the pharmacist: No    The patient will receive an FSI print out for each medication shipped and additional FDA Medication Guides as required.  Patient education from [place] or Robet Leu may also be included in the shipment.    DISEASE-SPECIFIC INFORMATION        patient has 3 days of medication on hand    ADHERENCE          MEDICARE PART B DOCUMENTATION     Myfortic 180mg : Patient has 18 tablets on hand.    SHIPPING     Shipping address confirmed in FSI.     Delivery Scheduled: Yes, Expected medication delivery date: 7/5 (sd courier) via UPS or courier.     [name]   [facility] Specialty Technician

## 2018-02-10 LAB — RENAL FUNCTION PANEL
ALBUMIN: 4.3 g/dL (ref 3.5–5.5)
BLOOD UREA NITROGEN: 14 mg/dL (ref 6–24)
BUN / CREAT RATIO: 11 (ref 9–23)
CALCIUM: 10 mg/dL (ref 8.7–10.2)
CO2: 20 mmol/L (ref 20–29)
CREATININE: 1.23 mg/dL — ABNORMAL HIGH (ref 0.57–1.00)
GFR MDRD NON AF AMER: 48 mL/min/{1.73_m2} — ABNORMAL LOW
GLUCOSE: 96 mg/dL (ref 65–99)
PHOSPHORUS, SERUM: 2.5 mg/dL (ref 2.5–4.5)
POTASSIUM: 4.8 mmol/L (ref 3.5–5.2)
SODIUM: 137 mmol/L (ref 134–144)

## 2018-02-10 LAB — CALCIUM: Lab: 10

## 2018-02-10 LAB — MAGNESIUM: Lab: 1.5 — ABNORMAL LOW

## 2018-02-11 ENCOUNTER — Encounter: Admit: 2018-02-11 | Discharge: 2018-02-11 | Payer: 59 | Attending: Nurse Practitioner | Primary: Nurse Practitioner

## 2018-02-11 DIAGNOSIS — M160 Bilateral primary osteoarthritis of hip: Secondary | ICD-10-CM

## 2018-02-11 DIAGNOSIS — I132 Hypertensive heart and chronic kidney disease with heart failure and with stage 5 chronic kidney disease, or end stage renal disease: Secondary | ICD-10-CM

## 2018-02-11 DIAGNOSIS — G894 Chronic pain syndrome: Principal | ICD-10-CM

## 2018-02-11 DIAGNOSIS — M792 Neuralgia and neuritis, unspecified: Secondary | ICD-10-CM

## 2018-02-11 DIAGNOSIS — Z79899 Other long term (current) drug therapy: Secondary | ICD-10-CM

## 2018-02-11 DIAGNOSIS — Z940 Kidney transplant status: Secondary | ICD-10-CM

## 2018-02-11 DIAGNOSIS — R7881 Bacteremia: Secondary | ICD-10-CM

## 2018-02-11 DIAGNOSIS — F419 Anxiety disorder, unspecified: Secondary | ICD-10-CM

## 2018-02-11 DIAGNOSIS — N186 End stage renal disease: Secondary | ICD-10-CM

## 2018-02-11 DIAGNOSIS — Z5181 Encounter for therapeutic drug level monitoring: Secondary | ICD-10-CM

## 2018-02-11 DIAGNOSIS — I5032 Chronic diastolic (congestive) heart failure: Secondary | ICD-10-CM

## 2018-02-11 DIAGNOSIS — B9689 Other specified bacterial agents as the cause of diseases classified elsewhere: Secondary | ICD-10-CM

## 2018-02-11 DIAGNOSIS — T8613 Kidney transplant infection: Principal | ICD-10-CM

## 2018-02-11 DIAGNOSIS — M170 Bilateral primary osteoarthritis of knee: Secondary | ICD-10-CM

## 2018-02-11 DIAGNOSIS — B9561 Methicillin susceptible Staphylococcus aureus infection as the cause of diseases classified elsewhere: Secondary | ICD-10-CM

## 2018-02-11 DIAGNOSIS — J449 Chronic obstructive pulmonary disease, unspecified: Secondary | ICD-10-CM

## 2018-02-11 DIAGNOSIS — Z992 Dependence on renal dialysis: Secondary | ICD-10-CM

## 2018-02-11 DIAGNOSIS — R4182 Altered mental status, unspecified: Secondary | ICD-10-CM

## 2018-02-11 DIAGNOSIS — Z87891 Personal history of nicotine dependence: Secondary | ICD-10-CM

## 2018-02-11 DIAGNOSIS — Z0289 Encounter for other administrative examinations: Secondary | ICD-10-CM

## 2018-02-11 DIAGNOSIS — M5481 Occipital neuralgia: Secondary | ICD-10-CM

## 2018-02-11 LAB — CBC W/ DIFFERENTIAL
BANDED NEUTROPHILS ABSOLUTE COUNT: 0 10*3/uL (ref 0.0–0.1)
BASOPHILS ABSOLUTE COUNT: 0 10*3/uL (ref 0.0–0.2)
BASOPHILS RELATIVE PERCENT: 1 %
EOSINOPHILS ABSOLUTE COUNT: 0.1 10*3/uL (ref 0.0–0.4)
EOSINOPHILS RELATIVE PERCENT: 3 %
HEMATOCRIT: 35.2 % (ref 34.0–46.6)
HEMOGLOBIN: 11.5 g/dL (ref 11.1–15.9)
IMMATURE GRANULOCYTES: 0 %
LYMPHOCYTES ABSOLUTE COUNT: 0.1 10*3/uL — ABNORMAL LOW (ref 0.7–3.1)
LYMPHOCYTES RELATIVE PERCENT: 4 %
MEAN CORPUSCULAR HEMOGLOBIN CONC: 32.7 g/dL (ref 31.5–35.7)
MEAN CORPUSCULAR HEMOGLOBIN: 28.6 pg (ref 26.6–33.0)
MEAN CORPUSCULAR VOLUME: 88 fL (ref 79–97)
MONOCYTES RELATIVE PERCENT: 4 %
NEUTROPHILS ABSOLUTE COUNT: 3.1 10*3/uL (ref 1.4–7.0)
NEUTROPHILS RELATIVE PERCENT: 88 %
PLATELET COUNT: 269 10*3/uL (ref 150–450)
RED BLOOD CELL COUNT: 4.02 x10E6/uL (ref 3.77–5.28)
WHITE BLOOD CELL COUNT: 3.5 10*3/uL (ref 3.4–10.8)

## 2018-02-11 LAB — TOXICOLOGY SCREEN, URINE
BARBITURATE SCREEN URINE: <200
BENZODIAZEPINE SCREEN, URINE: <200
CANNABINOID SCREEN URINE: <20
COCAINE(METAB.)SCREEN, URINE: <150
METHADONE SCREEN, URINE: <300

## 2018-02-11 LAB — CANNABINOID SCREEN URINE: Lab: <20

## 2018-02-11 LAB — PLATELET COUNT: Lab: 269

## 2018-02-11 MED ORDER — OXYCODONE-ACETAMINOPHEN 10 MG-325 MG TABLET
ORAL_TABLET | Freq: Every day | ORAL | 0 refills | 0 days | Status: CP | PRN
Start: 2018-02-11 — End: 2018-03-31

## 2018-02-11 MED FILL — MYFORTIC/180MG/TAB: MYFORTIC/180MG/TAB | 30 days supply | Qty: 180 | Fill #0

## 2018-02-11 NOTE — Unmapped (Addendum)
It has been my pleasure to participate in your care.   Today we did the following :  We have refilled your percocet  1 mo  We are happy that you had a kidney transplant   You can continue treating your neuropathic pain with gabapentin at your PCP  We can not longer provide percocet for you since you are not on HD any longer  No apt scheduled, return as needed   You are on low dose of percocet , we are not expecting for you to have   Withdrawals after stopping percocet; but you get nausea, abd pain   Or similar symptoms, ask your nephrologist what you can take for   Withdrawals symptoms for a few days - we usually prescribe percocet,  But given that you had a kidney transplant, we have to be careful   About your medications.  We wish you the very best !        You may reach me via MyChart, phone or fax.      Please note that while I will try to answer messages in a timely manner, all urgent issues should be directed to your PCP.     Helpful Contact Information :  ?? Appointments/ Internal Medicine Clinic: [phone]    ?? HIPPA Compliant Facsimile: [phone]  ?? Same Day Clinic:  [phone].  8am-5pm Monday through Friday   ?? After Hours:  [phone].  A nurse will answer and can help you decide what kind of medication attention you need.   ?? [facility]:  [phone].  If you are sick, but not injured:  9am-8pm Mon-Sun.  [address], [place], [place] off of [address].  All walk-ins accepted.    ?? [facility] at the [facility]:  [phone].  7am-9pm Mon-Fri; 12pm-5pm [address]  [address], [place] [address].  All walk-ins accepted.       If we have prescribed you a medication, be prepared that your insurance company may require additional paperwork (even for generic medication), typically called a prior authorization.  This sometimes can delay the prescription, but know that our office is very efficient at taking care of these.  If it is significantly delayed, you may call the pharmacy for updates, or call the office.  *Bring opioid medication with you to each visit     MyChart Messages:  Please use MyChart for non-urgent symptoms or matters such as general questions,  non-urgent prescription refills, or non-urgent scheduling issues. For your safety and best  care please DO NOT use MyChart messages to report emergent or urgent symptoms as  messages are only checked during regular business hours.      DO:  1. Read the medication guide  2. Take medication exactly as prescribed  3. Store medication in a safe place, away from children and pets, or anyone who might    steal or misuse it. Store it in a safe and secure place (lock box). Also secure any          unfilled prescriptions.    4. Bring unused medication to the next visit so it can be disposed of safely  5. Tell the prescribing provider if you experience any side effects from the medication. You may also report side effects to the FDA at 1-800-FDA-1088.  6. DO check your states rules regarding opioids and driving.     DO NOT:  1. Give your medications to others  2. Take medication unless it was prescribed to you  3. Take more medication than prescribed   4. Stop taking medication without first talking to your provider  5. Break, chew, crush, dissolve, or inject medications. If you cannot swallow your medications, talk to your provider.   6. Drink alcohol or use illegal drugs while taking this medication     BENZODIAZEPINES: Taking opioid pain medications in combination with benzodiazepines can increase the risk of overdose and death.      -Because of the high volume of calls we receive and the high demand for our clinical services, we are occupied all day providing care for patients in the clinic. This leaves little time to respond to phone calls, and we are generally unable to discuss patient care advice over the telephone. If you are experiencing a medication side effect or complication, you can call and let us know, but we will typically not make a medication substitution or change over the telephone.      We are generally unable to respond acutely to a flare up of pain, as this is quite common in our patients and needs to be dealt with as part of the long term management plan. Please make an appointment with [name] if you wish to discuss a matter in any detail. Should you still need to call, please do so at [phone].      Please do not call for early medication refills.      Thank you for choosing [facility]. It was a pleasure to see you in clinic today. Please contact us with any questions or concerns at [phone].         What are common side effects from pain medications you may be taking?     Do not drive or do anything with responsibility until you know you are tolerating a new medication.    Opioids/Narcotics (Examples: oxycodone, hydrocodone, tramadol, morphine, hydromorphone, fentanyl patch)  ?? Constipation-It is important to treat this early and effectively - your doctor can help!  ?? Itching  ?? Nausea  ?? Dizziness or lightheadedness  ?? Drowsiness or sedation  ?? Decreased breathing   ?? Addiction  Sexual difficulties or loss of interest.  Mood changes.  Cognitive impairment (not thinking or responding normally).     Anticonvulsants (Examples: Gabapentin, Pregabalin, Carbamazepine, topiramate)  ?? Dizziness or lightheadedness  ?? Drowsiness or sedation  ?? Nausea  ?? Leg swelling     Antidepressants (Examples: duloxetine,amitriptyline, nortriptyline, venlafaxine)  ?? Dizziness or lightheadedness  ?? Drowsiness or sedation  ?? Constipation  ?? Urinary retention  ?? Dry mouth  ?? Nausea  ?? Difficulty falling asleep     NSAIDS (Examples: Ibuprofen, naproxen, meloxicam, celecoxib)  -The FDA put out guidelines in 2015 that NSAIDs should not be used regularly due to safety concerns.  OK to use a few times a month, or for 1-2 weeks for a flare of pain.  If you take NSAIDs everyday, we should discuss this and stop it due to safety.  ?? Bleeding  ?? Stomach ulcers  ?? Kidney problems  -Heart Attack  -Stroke     Muscle relaxants (Examples: cyclobenzaprine, tizanidine, baclofen)  ?? Dizziness or lightheadedness  ?? Drowsiness or sedation  ?? Nausea  ?? Low blood pressure  ?? Dry mouth     Many pain medications can lead to a rare, potentially dangerous condition called serotonin syndrome. Symptoms can include tremors, agitation, fever, muscle rigidity/contractions, diarrhea, excessive sweating, palpitations and high  blood pressure. If these symptoms arise, please contact us or report to the emergency room for further management.      Your pharmacist can answer any questions you may have in regard to all your medications or possible interactions.      OPIOID(NARCOTIC) MEDICATIONS     You must obtain you narcotic/opioid medications from the [facility] and inform your Pain physician if you receive pain medications (narcotics) from another doctor or emergency room.     It is important that you secure your pain medications to keep them safe from children. You must not share, sell or trade your pain medications with anyone. You must not take anyone else's pain medications. You should not take more pain medications than prescribed without asking your pain doctor first.     You will not receive extra refills for lost or stolen medications, regardless of the circumstances. You may be charged with driving under the influence (DUI) if you drive while on pain medications.     Pain medications can cause sleepiness, dangerously slow breathing and even death. If you or a family member notice a change in your breathing, notify the pain physician immediately.  If you stop breathing or are found unconscious, your family should immediately call 911. Use of pain medicines with alcohol can increase the risk of these complications.     If you stop taking pain medications too quickly, you may have side effects such as sweating, shaking, loss of temper, nausea, diarrhea and increased pain.     A narcotic overdose is the misuse or overuse (doubling up on pills or taking doses too close together) of a narcotic drug. A narcotic overdose can make you pass out and stop breathing. This is especially true if you drink alcohol or use valium-like medications in addition to the opioid pain medication.  If you stop breathing or are found unconscious, your family should immediately call 911.     You may experience constipation while taking these medications.  You may need to use a stool softener and laxative.     Opiates (narcotics) are medicines used to relieve moderate to severe pain. They may be used for a short time for pain, such as after surgery. Or they may be used for long-term pain. They don't cure a health problem. But they help you manage the pain.  Opiates relieve pain by changing the way your body feels pain and the way you feel about pain. Opiates are powerful medicines. You may need to take extra steps to stay safe. Taking too much (overdose) of an opiate can cause death.     SPECIAL RISK:  Taking your medication in combination with a benzodiazepine medication (nerve pill such as Valium, Xanax, Klonopin, Ativan) can increase the risk to you of overdose and death.        What to know about taking this medicine  ?? Your body gets used to opiates if you take them all the time. You could have withdrawal symptoms when you stop taking them. Symptoms include nausea, sweating, chills, diarrhea, and shaking. But you can avoid these symptoms if you slowly stop taking the medicine as your doctor tells you to.  ?? You have a small chance of addiction if you take opiates as prescribed. Your risk is a bit higher if you have abused drugs in the past.  ?? Some opiates have acetaminophen in them. Check the labels on all the other medicines you take. This includes over-the-counter drugs. Many medicines  have acetaminophen. Do not take others with acetaminophen in them unless your doctor has told you to. Taking too much acetaminophen can be harmful. Talk to your doctor or pharmacist if you have questions about this.  ?? Be sure you know how to safely get rid of any leftover medicine. Talk to your doctor or pharmacist about how to do this. Ask for written instructions.     When should you call for help?  Call 911 anytime you think you may need emergency care. For example, call if:  ?? You have trouble breathing.  ?? You have swelling of your face, lips, tongue, or throat.  ?? You have signs of an overdose. These include:  ? Cold, clammy skin.  ? Confusion.  ? Severe nervousness or restlessness.  ? Severe dizziness, drowsiness, or weakness.  ? Slow breathing.  ? Seizures.

## 2018-02-11 NOTE — Unmapped (Signed)
Pt did not bring her Oxycodone. She said she only have 1 pill left

## 2018-02-11 NOTE — Unmapped (Signed)
RN reviewed AVS with patient. RN handed the below prescription(s) to the patient:    oxyCODONE-acetaminophen (PERCOCET) 10-325 mg per tablet 30 tablet 0 02/11/2018     Sig - Route: Take 1 tablet by mouth daily as needed for pain. for up to 1 dose May fill today 02/11/18 - Oral    Class: Print

## 2018-02-12 LAB — RENAL FUNCTION PANEL
ALBUMIN: 4.4 g/dL (ref 3.5–5.5)
BLOOD UREA NITROGEN: 17 mg/dL (ref 6–24)
CALCIUM: 10.6 mg/dL — ABNORMAL HIGH (ref 8.7–10.2)
CHLORIDE: 105 mmol/L (ref 96–106)
CO2: 18 mmol/L — ABNORMAL LOW (ref 20–29)
CREATININE: 1.4 mg/dL — ABNORMAL HIGH (ref 0.57–1.00)
GFR MDRD AF AMER: 47 mL/min/{1.73_m2} — ABNORMAL LOW
GLUCOSE: 102 mg/dL — ABNORMAL HIGH (ref 65–99)
POTASSIUM: 4.8 mmol/L (ref 3.5–5.2)
SODIUM: 138 mmol/L (ref 134–144)

## 2018-02-12 LAB — BUN / CREAT RATIO: Lab: 12

## 2018-02-12 LAB — MAGNESIUM: Lab: 1.6

## 2018-02-13 NOTE — Unmapped (Signed)
[facility] [place], [place] [address]  [phone]    Chronic Pain Follow Up Note  1. Chronic pain syndrome  2. Neuropathic pain  3. Anxiety  4. Kidney transplanted     Assessment and Plan  Danielle Parker is a 56 y.o. being followed at [facility] clinic for complaint of chronic pain localized to the neck, left upper extremity, bilateral knees.  Her history is remarkable for ESRD on dialysis for 8 years and a recent right kidney transplant on June the 1st.  Diabetes Mellitus type 2, CHF, HTN, peripheral neuropathy, arthritis of the knees and hips, GERD, arthritis, and history of seizures (no medications since 2000). While on HD, we have treated patient with opiates and she has attended a  Psychology sessions for coping mechanisms and anxiety management.      Up until today, we have treated patient for chronic pain related to HD ; We have prescribed her percocet 10/325 mg once/day as needed for after the dialysis pain. Patient also has a neuropathy related to nerves damage for years on the dialysis. She is on gabapentin for it with moderate pain control.    She presents today s/p right kidney transplant on 01/08/18 and doing well. She is able to void now. She is following the transplant team every other Thursday and will be removing her right abdominal staples this week.    Patient reports using more Percocet lately due to her transplant surgery. Her last Percocet refill was on 01/23/18 and she has only 1 tab today. She is short by number of pills. I have contacted Dr. Comer Locket and it was decided that this would be the last month for her  percocet refill.  Patient is off dialysis now, and she is no longer needs opiates for her treatment. She can continue the gabapentin prescribed by the PCP. She verbalized understanding of this plan.     Last urine toxicology: 02/18/2016 ( was on HD) , We can repeat today.  Opioid contract: 05/26/2017  Last Opioid Increase: 01/22/15 - started Percocet 10-325 mg daily  Previous Compliance Issues: negative urine screen at [facility] pain clinic in [place], rx for tramadol after hospitalization.   Naloxone ordered: no  Total morphine equivalents: 15 meq     PLAN:    1. Refilled oxycodone-acetaminophen (Percocet).  Take 1 tab per day as needed for pain #30 tabs x 1    month. This going to be the last script for the patient.    2. Continue Gabapentin 300 mg daily as needed, refill not needed. This is rx'd by another physcian.      3. Patient is on a once/  day percocet, I do not anticipate withdrawal symptoms. Yet, she was instructed to      Contact us with any symptoms of nausea or increased sweating or abdominal pain , we can prescribe      Her clonidine for 5-7 days; YET she has to clear this with her transplant team. Again, being on the low      Dose daily percocet is it unlikely that she will  Have a withdrawal symptoms.    4. Continue care at your nephrologist and a transplant team.     5. No follow up apt, patient to see [name] PRN.    6. Due to the recent transplant surgery and overuse of the percocet, I have agreed for early medication refill . This was discussed with Dr. Comer Locket today.     -Return PRN ,  for follow up as needed - not scheduled .     Requested Prescriptions     Signed Prescriptions Disp Refills   ??? oxyCODONE-acetaminophen (PERCOCET) 10-325 mg per tablet 30 tablet 0     Sig: Take 1 tablet by mouth daily as needed for pain. for up to 1 dose May fill today 02/11/18     Orders Placed This Encounter   Procedures   ??? Toxicology Screen, Urine   ??? Opiate Confirmation, Urine      Treatment agreement renewal was completed 05/26/17.    I have personally reviewed the patient's medical record.   I have personally reviewed the patient's clinical labs and urine toxicology studies   I have reviewed the [place] Narcotic Database today    HPI  Danielle Parker is a 57 y.o. with chronic pain localized to upper extremity and neck. The majority of her pain that she experiences is over the scar tissue of her left brachium. She was on HD for 8 years due to the kidney failure. We have treated Danielle Parker with percocet for after the procedure ( HD) pain. She is also treated for the dialysis - related neuropathy with gabapentin prescribed her by outside clinic provider    She returns today for follow up and medication refill. Reports had a right kidney transplant on 01/08/18 and is able to void now. I have told the patient that I am very happy for her today. She reports overusing her percocet since her transplant surgery in June. She is short today by number of pills.   She continues to use O2 at home. She has been asked to have an evaluation by pulmonologist. She feels that the root of her problems is her kidney failure, not a lung issue. She uses 2L of O2 all of the time. She feels that she cannot walk long (more than her mailbox) without feels SOB.    We were  treating patient with percocet 10/325 1-2 tabs on dialysis days. She described painful cramping with HD session. Crippling pain. Headaches are still after prior occipital nerve block. Pain is rated as ranging from 7-10/10. Average pain is a 7/10. The area continues to be neck and left arm. Character is burning, sharp, shooting, and throbbing. It is present all of the time. It was worse with  HD. Pain negatively affected  relationship with people. The pain is still there, but we expect it to get better  Since she has stopped the HD now.     Since patient is no longer on HD, and after discussing the case with Dr. Comer Locket, it was decided to stop her percocet. She is still on gabapentin by another physician. We can see Danielle Parker as needed. She can have one more month of the percocet today. We would not anticipate for her to have a withdrawal symptoms , yet she was instructed to call us with any symptoms of abdominal pain nausea or other symptoms. This also should be discussed with her transplant team, should she needs clonidine.     Patients current medication regimen:  Percocet 10-325 PRN   Gabapentin 300mg  daily PRN  Amitriptyline 10mg      Medication Monitoring  [place] database was reviewed today. Prior issues: she had a fill of tramadol 50mg  # 30 on 01/12/18.  This was post surgery.     Last urine toxicology screen: 07/06: negative for opioids. She is on dialysis.   Opioid Risk Tool: 1 (10/2014)    Allergies  Allergies  Allergen Reactions   ??? Buprenorphine Hcl Nausea And Vomiting   ??? Codeine Nausea And Vomiting   ??? Hydrocodone-Acetaminophen Nausea And Vomiting   ??? Morphine Nausea And Vomiting     Halllucinations     Home Medications    Current Outpatient Medications   Medication Sig Dispense Refill   ??? acetaminophen (TYLENOL) 500 MG tablet TAKE 1 TO 2 TABLETS (500-1000MG ) BY MOUTH THREE TIMES DAILY AS NEEDED (Patient taking differently: TAKE 1 TO 2 TABLETS (500-1000MG ) BY MOUTH THREE TIMES DAILY AS NEEDED FOR PAIN) 100 tablet PRN   ??? albuterol (PROVENTIL HFA;VENTOLIN HFA) 90 mcg/actuation inhaler Inhale 2 puffs daily as needed for shortness of breath.      ??? amLODIPine (NORVASC) 10 MG tablet Take 1 tablet (10 mg total) by mouth daily. Use only as directed. 30 tablet 11   ??? aspirin (ECOTRIN) 81 MG tablet Take 81 mg by mouth daily.     ??? budesonide-formoterol (SYMBICORT) 80-4.5 mcg/actuation inhaler Inhale 2 puffs Two (2) times a day. 15 g 11   ??? gabapentin (NEURONTIN) 300 MG capsule Take 300 mg by mouth nightly.     ??? hydrALAZINE (APRESOLINE) 25 MG tablet Take 1 tablet (25 mg total) by mouth Three (3) times a day. 90 tablet 0   ??? metoprolol tartrate (LOPRESSOR) 25 MG tablet Take 3 tablets (75 mg total) by mouth Two (2) times a day. 180 tablet 11   ??? MYFORTIC 180 mg EC tablet Take 3 tablets (540 mg total) by mouth Two (2) times a day. 180 tablet 11   ??? omeprazole (PRILOSEC) 40 MG capsule Take 1 capsule (40 mg total) by mouth daily. 90 capsule 3   ??? OXYGEN-AIR DELIVERY SYSTEMS MISC Inhale 3 L continuous.     ??? polyethylene glycol (MIRALAX) 17 gram packet Take 17 g by mouth daily as needed. (Patient taking differently: Take 17 g by mouth daily as needed. Indications: constipation) 30 each 0   ??? PROGRAF 1 mg capsule Take 6 mg every morning and 5 mg every evening, Z94.0, tx date 01/08/18 330 capsule 11   ??? rosuvastatin (CRESTOR) 5 MG tablet Take 5 mg by mouth daily.     ??? sodium bicarbonate 650 mg tablet Take 1 tablet (648 mg total) by mouth Two (2) times a day. 60 tablet 11   ??? sulfamethoxazole-trimethoprim (BACTRIM,SEPTRA) 400-80 mg per tablet Take 1 tablet (80 mg of trimethoprim total) by mouth 3 (three) times a week. 12 tablet 5   ??? valGANciclovir (VALCYTE) 450 mg tablet Take 1 tablet (450 mg total) by mouth daily. 30 tablet 1   ??? oxyCODONE-acetaminophen (PERCOCET) 10-325 mg per tablet Take 1 tablet by mouth daily as needed for pain. for up to 1 dose May fill today 02/11/18 30 tablet 0     No current facility-administered medications for this visit.      Previous Medication Trials:  Nortriptyline--increased cramping  Topical lidocaine--no improvement  Baclofen--no improvement, too sleepy    Previous Interventions  Previous interventions include PT and Nerve Blocks.  She gets intraarticular knee injections for OA from PCP.     Previous tests include MRI, CT scan, Xrays    Review Of Systems  General daytime drowsiness  Cardiovascular chest pain, palpitations  Gastrointestinal gastric reflux  Skin none  Endocrine none  Musculoskeletal spasms/spacticity/cramps, muscle aches/weakness  Neurologic numbness/tingling  Psychiatric none      Physical Exam  VITALS:   Vitals:    02/11/18 1019   BP: 130/82   Pulse:  68   Resp: 20   Temp: 36.3 ??C (97.3 ??F)   SpO2: 100%     Wt Readings from Last 3 Encounters:   02/11/18 76.5 kg (168 lb 9.6 oz)   02/11/18 76.3 kg (168 lb 3.2 oz)   02/04/18 76.6 kg (168 lb 12.8 oz)     GENERAL:  The patient is a well developed, NAD, in good spirits  LUNGS:   Normal work of breathing, O2 with nasal cannula  Cardiac:   Regular rate  Abdomen:   Large staples over right mid-abdomen  from recent  kidney transplant   EXTREMITIES:   AV shunt right UE  NEUROLOGIC:    Alert and awake, normal gait  PSY:  Normal affect

## 2018-02-14 LAB — TACROLIMUS BLOOD: Lab: 12.4

## 2018-02-14 MED ORDER — PROGRAF 1 MG CAPSULE: capsule | 11 refills | 0 days

## 2018-02-14 MED ORDER — PROGRAF 1 MG CAPSULE
ORAL_CAPSULE | ORAL | 11 refills | 0.00000 days | Status: CP
Start: 2018-02-14 — End: 2018-03-01

## 2018-02-14 NOTE — Unmapped (Signed)
Per Danielle Parker will decrease tacrolimus to 5 mg qAM and 4 mg qPM.  Called patient with dose adjustment and she verbalized understanding.    Patient complained of internal itching all over her body.  Explained that this may be due to elevated tacrolimus levels and that this can be addressed at appointment tomorrow.

## 2018-02-14 NOTE — Unmapped (Signed)
[facility] Refill Coordination Note  Specialty Medication(s): Valgancyclovir 450mg   Additional Medications shipped: Amlodipine 10mg , Docusate 100mg , Acetaminophen 500mg , Rosuvastatin 5mg   Patient states she has PLENTY of Prograf at this time.    Danielle Parker, DOB: 06-14-1961  Phone: [phone] (home) , Alternate phone contact: N/A  Phone or address changes today?: No  All above HIPAA information was verified with patient.  Shipping Address: [address]  [place] [address]   Insurance changes? No    Completed refill call assessment today to schedule patient's medication shipment from the [facility] [phone]).      Confirmed the medication and dosage are correct and have not changed: Yes, regimen is correct and unchanged.    Confirmed patient started or stopped the following medications in the past month:  No, there are no changes reported at this time.    Are you tolerating your medication?:  Danielle Parker reports tolerating the medication.    ADHERENCE  Did you miss any doses in the past 4 weeks? No missed doses reported.    FINANCIAL/SHIPPING    Delivery Scheduled: Yes, Expected medication delivery date: 02/15/2018 via same day courier     The patient will receive an FSI print out for each medication shipped and additional FDA Medication Guides as required.  Patient education from [facility] or Robet Leu may also be included in the shipment    Maleta did not have any additional questions at this time.    Delivery address validated in FSI scheduling system: Yes, address listed in FSI is correct.    We will follow up with patient monthly for standard refill processing and delivery.      Thank you,  Elga Santy  Anders Grant   [facility] Specialty Pharmacist

## 2018-02-14 NOTE — Unmapped (Signed)
TRANSPLANT SURGERY PROGRESS NOTE    Assessment and Plan  Danielle Parker is a 57 y.o. female who underwent deceased donor renal transplant on 01/08/2018. She presents today for outpatient follow up and staple removal.     All staples removed without issue and replaced with steri strips.   Last tacrolimus 9.5, level pending from today. Patient has experienced significant difficulty with lab draw given small vessels and inability to use R arm 2/2 AVG. Will review tacrolimus trough when available For now continue 5/4.    Follow up: 7/17 transplant nephrology   Labs: 2x/week    Subjective  Danielle Parker is a 57 y.o. female who underwent??renal transplantation??on 01/08/2018. Her initial post-operative course was straightforward and she was discharged on POD 6. PMH additionally significant for COPD (home O2 2L [place] PRN), chronic pain, GERD. Notably, she was treated with ciprofloxacin for donor MSSA and enterobacter in lungs during hospital stay.     She [facility] ED [facility] with nausea and lower abdominal pain x3 days. She, unknowingly to the team, was taken via EMS to OSH twice prior for similar complaints and underwent a CT scan with unremarkable findings/no fluid collection. Workup was largely unremarkable, she was treated with GI cocktail and SMOG for constipation with symptom improvement. After follow up in clinic it was identified that her abdominal pain was actually suprapubic in nature. Her ureteral stent was in place but she underwent premature stent removal 6/20 and has not experienced any recurrence of suprapubic/abdominal pain and/or nausea she had previously had.    She denies any N/V/D/C, no significant tremor or HA, no abdominal pain. She is adequately hydrating and making good urine. Incision without issue. Not using any supplemental O2.    Current Outpatient Medications   Medication Sig Dispense Refill   ??? acetaminophen (TYLENOL) 500 MG tablet TAKE 1 TO 2 TABLETS (500-1000MG ) BY MOUTH THREE TIMES DAILY AS NEEDED (Patient taking differently: TAKE 1 TO 2 TABLETS (500-1000MG ) BY MOUTH THREE TIMES DAILY AS NEEDED FOR PAIN) 100 tablet PRN   ??? albuterol (PROVENTIL HFA;VENTOLIN HFA) 90 mcg/actuation inhaler Inhale 2 puffs daily as needed for shortness of breath.      ??? amLODIPine (NORVASC) 10 MG tablet Take 1 tablet (10 mg total) by mouth daily. Use only as directed. 30 tablet 11   ??? aspirin (ECOTRIN) 81 MG tablet Take 81 mg by mouth daily.     ??? budesonide-formoterol (SYMBICORT) 80-4.5 mcg/actuation inhaler Inhale 2 puffs Two (2) times a day. 15 g 11   ??? gabapentin (NEURONTIN) 300 MG capsule Take 300 mg by mouth nightly.     ??? hydrALAZINE (APRESOLINE) 25 MG tablet Take 1 tablet (25 mg total) by mouth Three (3) times a day. 90 tablet 0   ??? metoprolol tartrate (LOPRESSOR) 25 MG tablet Take 3 tablets (75 mg total) by mouth Two (2) times a day. 180 tablet 11   ??? MYFORTIC 180 mg EC tablet Take 3 tablets (540 mg total) by mouth Two (2) times a day. 180 tablet 11   ??? omeprazole (PRILOSEC) 40 MG capsule Take 1 capsule (40 mg total) by mouth daily. 90 capsule 3   ??? oxyCODONE-acetaminophen (PERCOCET) 10-325 mg per tablet Take 1 tablet by mouth daily as needed for pain. for up to 1 dose May fill today 02/11/18 30 tablet 0   ??? OXYGEN-AIR DELIVERY SYSTEMS MISC Inhale 3 L continuous.     ??? polyethylene glycol (MIRALAX) 17 gram packet Take 17 g by mouth daily  as needed. (Patient taking differently: Take 17 g by mouth daily as needed. Indications: constipation) 30 each 0   ??? PROGRAF 1 mg capsule Take 5 mg every morning and 4 mg every evening, Z94.0, tx date 01/08/18 270 capsule 11   ??? rosuvastatin (CRESTOR) 5 MG tablet Take 5 mg by mouth daily.     ??? sodium bicarbonate 650 mg tablet Take 1 tablet (648 mg total) by mouth Two (2) times a day. 60 tablet 11   ??? sulfamethoxazole-trimethoprim (BACTRIM,SEPTRA) 400-80 mg per tablet Take 1 tablet (80 mg of trimethoprim total) by mouth 3 (three) times a week. 12 tablet 5   ??? valGANciclovir (VALCYTE) 450 mg tablet Take 1 tablet (450 mg total) by mouth daily. 30 tablet 1     No current facility-administered medications for this visit.      Objective    There were no vitals filed for this visit.   There is no height or weight on file to calculate BMI.     Physical Exam:    General Appearance:    No acute distress  Lungs:                 Clear to auscultation bilaterally  Heart:                            Regular rate and rhythm  Abdomen:                 Soft, round, non-tender, incision C/D/I extends to low pubic area. Staples intact  Extremities:               Warm and well perfused, no edema.    Data Review:  All lab results reviewed    Imaging: none today    _________________________________________________    Gertie Fey, DNP, APRN, FNP-C  [facility]  [address]  [place], [place]  [address]

## 2018-02-15 ENCOUNTER — Encounter: Admit: 2018-02-15 | Discharge: 2018-02-15 | Payer: 59

## 2018-02-15 ENCOUNTER — Ambulatory Visit: Admit: 2018-02-15 | Discharge: 2018-02-15 | Payer: 59

## 2018-02-15 DIAGNOSIS — R9389 Abnormal findings on diagnostic imaging of other specified body structures: Principal | ICD-10-CM

## 2018-02-15 DIAGNOSIS — Z4802 Encounter for removal of sutures: Secondary | ICD-10-CM

## 2018-02-15 DIAGNOSIS — R921 Mammographic calcification found on diagnostic imaging of breast: Principal | ICD-10-CM

## 2018-02-15 DIAGNOSIS — Z940 Kidney transplant status: Principal | ICD-10-CM

## 2018-02-15 DIAGNOSIS — D899 Disorder involving the immune mechanism, unspecified: Secondary | ICD-10-CM

## 2018-02-15 DIAGNOSIS — R928 Other abnormal and inconclusive findings on diagnostic imaging of breast: Principal | ICD-10-CM

## 2018-02-15 DIAGNOSIS — Z9889 Other specified postprocedural states: Secondary | ICD-10-CM

## 2018-02-15 MED ORDER — ROSUVASTATIN 5 MG TABLET
ORAL_TABLET | Freq: Every day | ORAL | 3 refills | 0.00000 days | Status: CP
Start: 2018-02-15 — End: 2018-12-21
  Filled 2018-05-09: qty 90, 90d supply, fill #0

## 2018-02-15 MED ORDER — ROSUVASTATIN 5 MG TABLET: 5 mg | tablet | 3 refills | 0 days | Status: AC

## 2018-02-15 MED ORDER — DOK 100 MG CAPSULE
ORAL_CAPSULE | PRN refills | 0 days | Status: CP
Start: 2018-02-15 — End: 2018-02-15

## 2018-02-15 MED ORDER — ACETAMINOPHEN 500 MG TABLET
ORAL | 99 refills | 0.00000 days | Status: CP
Start: 2018-02-15 — End: 2019-02-15

## 2018-02-15 MED ORDER — ACETAMINOPHEN 500 MG TABLET: tablet | 0 refills | 0 days | Status: AC

## 2018-02-15 MED ORDER — DOCUSATE SODIUM 100 MG CAPSULE
ORAL_CAPSULE | ORAL | 99 refills | 0 days
Start: 2018-02-15 — End: 2019-02-15

## 2018-02-15 MED FILL — ACETAMINOPHEN EXTRA STREN/500MG/TABS: ACETAMINOPHEN EXTRA STREN/500MG/TABS | 16 days supply | Qty: 100 | Fill #0

## 2018-02-15 MED FILL — DOK/100MG/CAPS: DOK/100MG/CAPS | 30 days supply | Qty: 60 | Fill #0

## 2018-02-15 MED FILL — ROSUVASTATIN CALCIUM/5MG/TABS: ROSUVASTATIN CALCIUM/5MG/TABS | 90 days supply | Qty: 90 | Fill #0

## 2018-02-15 MED FILL — AMLODIPINE BESYLATE/10MG/TABS: AMLODIPINE BESYLATE/10MG/TABS | 30 days supply | Qty: 30 | Fill #0

## 2018-02-15 NOTE — Unmapped (Signed)
Patient Name: Danielle Parker  Patient Age: 57 y.o.  Encounter Date: 02/15/2018    Referring Physician:   Drake Leach, PA  [address]  [address]  [address]  [place], [place] [address]    Primary Care Provider:  Tor Netters Philip Aspen, MD    Supervising Physician  Dr. Lucretia Roers      Reason for Visit:   Chief Complaint   Patient presents with   ??? New Problem       HPI:    Danielle Parker is a 57 y.o. female who is seen in consultation at the request of Towanda Octave* for the patient last April 2017 with a mammographic abnormality on the left for which she had biopsy for benign disease.  She had been assessed to be at average risk for the development of breast cancer.  Since I have seen her she has had a kidney transplant June 1.  She had imaging studies that identified a contralateral finding on the right for which a stereotactic core biopsy is recommended.  Calcifications measure 1.4 x 0.9 cm, 8 cm from the nipple    Review of Systems:  Notable for mild symptoms of respiratory cold    Reproductive History:  Menarche around age 37 last menstrual period was in 2002.  She is nulliparous    Medical History:  Past Medical History:   Diagnosis Date   ??? Abnormal mammogram    ??? Asthma    ??? Chronic kidney disease    ??? COPD (chronic obstructive pulmonary disease) (CMS-HCC)    ??? GERD (gastroesophageal reflux disease)    ??? Hypertension    ??? Migraine    ??? Nephrotic syndrome        Surgical History:  Past Surgical History:   Procedure Laterality Date   ??? ABDOMINAL SURGERY     ??? APPENDECTOMY     ??? COLON SURGERY      bowel resection   ??? COLONOSCOPY     ??? HERNIA REPAIR     ??? HYSTERECTOMY      partial hysterectomy, cyst on ovary   ??? PR COLSC FLX W/RMVL OF TUMOR POLYP LESION SNARE TQ N/A 12/18/2015    Procedure: COLONOSCOPY FLEX; W/REMOV TUMOR/LES BY SNARE;  Surgeon: Zetta Bills, MD;  [facility];  Service: Gastroenterology   ??? PR TRANSPLANT,PREP RENAL GRAFT/ARTERIAL N/A 01/08/2018    Procedure: [facility] RECONSTRUCTION CADAVER/LIVING DONOR RENAL ALLOGRAFT PRIOR TO TRANSPLANT; ARTERIAL ANASTOMOSIS EAC;  Surgeon: Leona Carry, MD;  Location: MAIN OR [facility];  Service: Transplant   ??? PR TRANSPLANTATION OF KIDNEY N/A 01/08/2018    Procedure: RENAL ALLOTRANSPLANTATION, IMPLANTATION OF GRAFT; WITHOUT RECIPIENT NEPHRECTOMY;  Surgeon: Leona Carry, MD;  Location: MAIN OR [facility];  Service: Transplant   ??? venous shunt         Family History:  Family History   Problem Relation Age of Onset   ??? No Known Problems Mother    ??? No Known Problems Father    ??? No Known Problems Sister    ??? No Known Problems Daughter    ??? No Known Problems Maternal Grandmother    ??? No Known Problems Maternal Grandfather    ??? No Known Problems Paternal Grandmother    ??? No Known Problems Paternal Grandfather    ??? BRCA 1/2 Neg Hx    ??? Breast cancer Neg Hx    ??? Cancer Neg Hx    ??? Colon cancer Neg Hx    ???  Endometrial cancer Neg Hx    ??? Ovarian cancer Neg Hx        Social History:  Tobacco use:   Social History     Tobacco Use   Smoking Status Former Smoker   ??? Packs/day: 2.00   ??? Years: 36.00   ??? Pack years: 72.00   ??? Types: Cigarettes   ??? Start date: 10/24/1975   ??? Last attempt to quit: 10/24/2011   ??? Years since quitting: 6.3   Smokeless Tobacco Never Used     Alcohol use:   Social History     Substance and Sexual Activity   Alcohol Use No   ??? Alcohol/week: 0.0 oz    Comment: quit alcohol use at age 58     Drug use:   Social History     Substance and Sexual Activity   Drug Use No     Living situation: Patient is a former smoker currently lives alone    Medications:     Current Outpatient Medications:   ???  acetaminophen (TYLENOL) 500 MG tablet, TAKE 1 TO 2 TABLETS (500-1000MG ) BY MOUTH THREE TIMES DAILY AS NEEDED (Patient taking differently: TAKE 1 TO 2 TABLETS (500-1000MG ) BY MOUTH THREE TIMES DAILY AS NEEDED FOR PAIN), Disp: 100 tablet, Rfl: PRN  ???  acetaminophen (TYLENOL) 500 MG tablet, TAKE 1 TO 2 TABLETS (500-1000MG ) BY MOUTH THREE TIMES DAILY AS NEEDED, Disp: 100 tablet, Rfl: PRN  ???  albuterol (PROVENTIL HFA;VENTOLIN HFA) 90 mcg/actuation inhaler, Inhale 2 puffs daily as needed for shortness of breath. , Disp: , Rfl:   ???  amLODIPine (NORVASC) 10 MG tablet, Take 1 tablet (10 mg total) by mouth daily. Use only as directed., Disp: 30 tablet, Rfl: 11  ???  aspirin (ECOTRIN) 81 MG tablet, Take 81 mg by mouth daily., Disp: , Rfl:   ???  budesonide-formoterol (SYMBICORT) 80-4.5 mcg/actuation inhaler, Inhale 2 puffs Two (2) times a day., Disp: 15 g, Rfl: 11  ???  DOK 100 mg capsule, TAKE 1 CAPSULE BY MOUTH TWICE DAILY AS NEEDED FOR CONSTIPATION., Disp: 60 capsule, Rfl: PRN  ???  gabapentin (NEURONTIN) 300 MG capsule, Take 300 mg by mouth nightly., Disp: , Rfl:   ???  hydrALAZINE (APRESOLINE) 25 MG tablet, Take 1 tablet (25 mg total) by mouth Three (3) times a day., Disp: 90 tablet, Rfl: 0  ???  metoprolol tartrate (LOPRESSOR) 25 MG tablet, Take 3 tablets (75 mg total) by mouth Two (2) times a day., Disp: 180 tablet, Rfl: 11  ???  MYFORTIC 180 mg EC tablet, Take 3 tablets (540 mg total) by mouth Two (2) times a day., Disp: 180 tablet, Rfl: 11  ???  omeprazole (PRILOSEC) 40 MG capsule, Take 1 capsule (40 mg total) by mouth daily., Disp: 90 capsule, Rfl: 3  ???  oxyCODONE-acetaminophen (PERCOCET) 10-325 mg per tablet, Take 1 tablet by mouth daily as needed for pain. for up to 1 dose May fill today 02/11/18, Disp: 30 tablet, Rfl: 0  ???  OXYGEN-AIR DELIVERY SYSTEMS MISC, Inhale 3 L continuous., Disp: , Rfl:   ???  polyethylene glycol (MIRALAX) 17 gram packet, Take 17 g by mouth daily as needed. (Patient taking differently: Take 17 g by mouth daily as needed. Indications: constipation), Disp: 30 each, Rfl: 0  ???  PROGRAF 1 mg capsule, Take 5 mg every morning and 4 mg every evening, Z94.0, tx date 01/08/18, Disp: 270 capsule, Rfl: 11  ???  rosuvastatin (CRESTOR) 5 MG tablet, Take 1 tablet (5 mg  total) by mouth daily., Disp: 90 tablet, Rfl: 3  ???  sodium bicarbonate 650 mg tablet, Take 1 tablet (648 mg total) by mouth Two (2) times a day., Disp: 60 tablet, Rfl: 11  ???  sulfamethoxazole-trimethoprim (BACTRIM,SEPTRA) 400-80 mg per tablet, Take 1 tablet (80 mg of trimethoprim total) by mouth 3 (three) times a week., Disp: 12 tablet, Rfl: 5  ???  valGANciclovir (VALCYTE) 450 mg tablet, Take 1 tablet (450 mg total) by mouth daily., Disp: 30 tablet, Rfl: 1    Allergies:  is allergic to buprenorphine hcl; codeine; hydrocodone-acetaminophen; and morphine.          Exam:  BSA: 1.84 meters squared  BP 132/81  - Pulse 56  - Temp 35.8 ??C (96.5 ??F)  - Resp 18  - Ht 160 cm (5' 2.99)  - Wt 76.2 kg (168 lb)  - SpO2 99%  - BMI 29.77 kg/m??   Pain Assessment: Pain scale 0    General Appearance:  No acute distress, well appearing and well nourished.   Head:  Normocephalic, atraumatic.   Eyes:  Conjuctiva and lids appear normal. Pupils equal and round,   sclera anicteric.   Ears:  Overall appearance normal with no scars, lesions or               masses.  Hearing is grossly normal.   Nose: Nares grossly normal, no drainage.   Throat: Lips, mucosa, and tongue normal; teeth and gums normal.   Breast:  Breast exam has no skin change mass or nipple discharge bilaterally   Axilla  no adenopathy in the axilla bilaterally   Neck:  Is supple trachea midline no JVD or supraclavicular adenopathy   Pulmonary:    Normal respiratory effort.  Lungs were clear to auscultation  bilaterally.   Cardiovascular:  Regular rate and rhythm, no murmur noted.  No thrill noted.   Musculoskeletal: Normal gait.  Extremities without clubbing, cyanosis, or           edema.   Psychiatric: Judgement and insight appropriate.  Oriented to person,         place,  and time.       Diagnostic Studies:  @MAMMOFINDINGS @  Per HPI    Assessment:  Abnormal right mammogram    Plan:  The patient is assessed to be at average risk for the development of breast cancer.  She has a normal clinical exam.  She will stereotactic guided core biopsy later today.  She will be called with results and then additional recommendations to follow      The note was transcribed by dragon and may contain errors in spelling

## 2018-02-15 NOTE — Unmapped (Signed)
Patient c/o incisional site pain.

## 2018-02-15 NOTE — Unmapped (Signed)
Valgancyclovir RTS until 7.20. Sending all other requested medications on 7/9 for delivery on 7/10.

## 2018-02-15 NOTE — Unmapped (Signed)
Pt request for RX Refill

## 2018-02-16 LAB — CBC W/ DIFFERENTIAL
BANDED NEUTROPHILS ABSOLUTE COUNT: 0 10*3/uL (ref 0.0–0.1)
BASOPHILS ABSOLUTE COUNT: 0.1 10*3/uL (ref 0.0–0.2)
EOSINOPHILS ABSOLUTE COUNT: 0.1 10*3/uL (ref 0.0–0.4)
EOSINOPHILS RELATIVE PERCENT: 2 %
HEMATOCRIT: 36.5 % (ref 34.0–46.6)
IMMATURE GRANULOCYTES: 0 %
LYMPHOCYTES ABSOLUTE COUNT: 0.2 10*3/uL — ABNORMAL LOW (ref 0.7–3.1)
LYMPHOCYTES RELATIVE PERCENT: 5 %
MEAN CORPUSCULAR HEMOGLOBIN CONC: 32.1 g/dL (ref 31.5–35.7)
MEAN CORPUSCULAR HEMOGLOBIN: 29.1 pg (ref 26.6–33.0)
MEAN CORPUSCULAR VOLUME: 91 fL (ref 79–97)
MONOCYTES ABSOLUTE COUNT: 0.1 10*3/uL (ref 0.1–0.9)
NEUTROPHILS ABSOLUTE COUNT: 2.9 10*3/uL (ref 1.4–7.0)
NEUTROPHILS RELATIVE PERCENT: 86 %
PLATELET COUNT: 211 10*3/uL (ref 150–450)
RED BLOOD CELL COUNT: 4.02 x10E6/uL (ref 3.77–5.28)
RED CELL DISTRIBUTION WIDTH: 18.3 % — ABNORMAL HIGH (ref 12.3–15.4)
WHITE BLOOD CELL COUNT: 3.4 10*3/uL (ref 3.4–10.8)

## 2018-02-16 LAB — BANDED NEUTROPHILS ABSOLUTE COUNT: Lab: 0

## 2018-02-16 LAB — TACROLIMUS BLOOD: Lab: 9.5

## 2018-02-16 NOTE — Unmapped (Signed)
Called with breast core path. Fiboadenomatoid change. I rec mammo in one year

## 2018-02-17 LAB — OPIATE, URINE, QUANTITATIVE
CODEINE-BY LC-MS/MS: NEGATIVE ng/mL
HYDROCODONE-BY LC-MS/MS: NEGATIVE ng/mL
HYDROMORPHONE-BY LC-MS/MS: NEGATIVE ng/mL
MORPHINE-BY LC-MS/MS: NEGATIVE ng/mL
NALOXONE-BY LC-MS/MS: NEGATIVE ng/mL
OXYMORPHONE-BY LC-MS/MS: NEGATIVE ng/mL

## 2018-02-17 LAB — RENAL FUNCTION PANEL
ALBUMIN: 4.4 g/dL (ref 3.5–5.5)
BLOOD UREA NITROGEN: 20 mg/dL (ref 6–24)
BUN / CREAT RATIO: 14 (ref 9–23)
CALCIUM: 10.2 mg/dL (ref 8.7–10.2)
CHLORIDE: 105 mmol/L (ref 96–106)
CREATININE: 1.43 mg/dL — ABNORMAL HIGH (ref 0.57–1.00)
GLUCOSE: 111 mg/dL — ABNORMAL HIGH (ref 65–99)
PHOSPHORUS, SERUM: 3.2 mg/dL (ref 2.5–4.5)
POTASSIUM: 5 mmol/L (ref 3.5–5.2)
SODIUM: 139 mmol/L (ref 134–144)

## 2018-02-17 LAB — CALCIUM: Lab: 10.2

## 2018-02-17 LAB — MAGNESIUM: Lab: 1.6

## 2018-02-17 LAB — OXYCODONE-BY LC-MS/MS: Oxycodone:MCnc:Pt:Urine:Qn:Confirm: NEGATIVE

## 2018-02-18 DIAGNOSIS — N186 End stage renal disease: Secondary | ICD-10-CM

## 2018-02-18 DIAGNOSIS — I132 Hypertensive heart and chronic kidney disease with heart failure and with stage 5 chronic kidney disease, or end stage renal disease: Secondary | ICD-10-CM

## 2018-02-18 DIAGNOSIS — R7881 Bacteremia: Secondary | ICD-10-CM

## 2018-02-18 DIAGNOSIS — G894 Chronic pain syndrome: Secondary | ICD-10-CM

## 2018-02-18 DIAGNOSIS — B9561 Methicillin susceptible Staphylococcus aureus infection as the cause of diseases classified elsewhere: Secondary | ICD-10-CM

## 2018-02-18 DIAGNOSIS — Z79899 Other long term (current) drug therapy: Secondary | ICD-10-CM

## 2018-02-18 DIAGNOSIS — M792 Neuralgia and neuritis, unspecified: Secondary | ICD-10-CM

## 2018-02-18 DIAGNOSIS — M160 Bilateral primary osteoarthritis of hip: Secondary | ICD-10-CM

## 2018-02-18 DIAGNOSIS — M170 Bilateral primary osteoarthritis of knee: Secondary | ICD-10-CM

## 2018-02-18 DIAGNOSIS — I5032 Chronic diastolic (congestive) heart failure: Secondary | ICD-10-CM

## 2018-02-18 DIAGNOSIS — Z5181 Encounter for therapeutic drug level monitoring: Secondary | ICD-10-CM

## 2018-02-18 DIAGNOSIS — T8613 Kidney transplant infection: Principal | ICD-10-CM

## 2018-02-18 DIAGNOSIS — J449 Chronic obstructive pulmonary disease, unspecified: Secondary | ICD-10-CM

## 2018-02-18 DIAGNOSIS — B9689 Other specified bacterial agents as the cause of diseases classified elsewhere: Secondary | ICD-10-CM

## 2018-02-18 DIAGNOSIS — Z87891 Personal history of nicotine dependence: Secondary | ICD-10-CM

## 2018-02-18 LAB — TACROLIMUS BLOOD: Lab: 10.2

## 2018-02-20 NOTE — Unmapped (Signed)
Patient c/o back pain to bilateral upper back just under rib cage that is worse with turning.No fever. Denies injury. Started 5 days agobut worse tonight.. States her urine is clear but darker in the morning- lightens up through the day. Not cloudy,no blood. She will take tylenol and an oxy tonight  But will seek care at the ED tomorrow if the pain has not resolved.

## 2018-02-21 LAB — CBC W/ DIFFERENTIAL
BANDED NEUTROPHILS ABSOLUTE COUNT: 0 10*3/uL (ref 0.0–0.1)
BASOPHILS ABSOLUTE COUNT: 0 10*3/uL (ref 0.0–0.2)
BASOPHILS RELATIVE PERCENT: 1 %
EOSINOPHILS ABSOLUTE COUNT: 0.1 10*3/uL (ref 0.0–0.4)
EOSINOPHILS RELATIVE PERCENT: 4 %
HEMATOCRIT: 35.6 % (ref 34.0–46.6)
HEMOGLOBIN: 11.4 g/dL (ref 11.1–15.9)
IMMATURE GRANULOCYTES: 0 %
LYMPHOCYTES ABSOLUTE COUNT: 0.2 10*3/uL — ABNORMAL LOW (ref 0.7–3.1)
LYMPHOCYTES RELATIVE PERCENT: 7 %
MEAN CORPUSCULAR HEMOGLOBIN CONC: 32 g/dL (ref 31.5–35.7)
MEAN CORPUSCULAR VOLUME: 90 fL (ref 79–97)
MONOCYTES ABSOLUTE COUNT: 0.1 10*3/uL (ref 0.1–0.9)
MONOCYTES RELATIVE PERCENT: 5 %
NEUTROPHILS RELATIVE PERCENT: 83 %
PLATELET COUNT: 234 10*3/uL (ref 150–450)
RED BLOOD CELL COUNT: 3.95 x10E6/uL (ref 3.77–5.28)
RED CELL DISTRIBUTION WIDTH: 18.3 % — ABNORMAL HIGH (ref 12.3–15.4)

## 2018-02-21 LAB — EOSINOPHILS RELATIVE PERCENT: Lab: 4

## 2018-02-22 DIAGNOSIS — N186 End stage renal disease: Secondary | ICD-10-CM

## 2018-02-22 DIAGNOSIS — M170 Bilateral primary osteoarthritis of knee: Secondary | ICD-10-CM

## 2018-02-22 DIAGNOSIS — T8613 Kidney transplant infection: Principal | ICD-10-CM

## 2018-02-22 DIAGNOSIS — J449 Chronic obstructive pulmonary disease, unspecified: Secondary | ICD-10-CM

## 2018-02-22 DIAGNOSIS — I5032 Chronic diastolic (congestive) heart failure: Secondary | ICD-10-CM

## 2018-02-22 DIAGNOSIS — M160 Bilateral primary osteoarthritis of hip: Secondary | ICD-10-CM

## 2018-02-22 DIAGNOSIS — B9689 Other specified bacterial agents as the cause of diseases classified elsewhere: Secondary | ICD-10-CM

## 2018-02-22 DIAGNOSIS — G894 Chronic pain syndrome: Secondary | ICD-10-CM

## 2018-02-22 DIAGNOSIS — M792 Neuralgia and neuritis, unspecified: Secondary | ICD-10-CM

## 2018-02-22 DIAGNOSIS — Z87891 Personal history of nicotine dependence: Secondary | ICD-10-CM

## 2018-02-22 DIAGNOSIS — R7881 Bacteremia: Secondary | ICD-10-CM

## 2018-02-22 DIAGNOSIS — Z5181 Encounter for therapeutic drug level monitoring: Secondary | ICD-10-CM

## 2018-02-22 DIAGNOSIS — B9561 Methicillin susceptible Staphylococcus aureus infection as the cause of diseases classified elsewhere: Secondary | ICD-10-CM

## 2018-02-22 DIAGNOSIS — Z79899 Other long term (current) drug therapy: Secondary | ICD-10-CM

## 2018-02-22 DIAGNOSIS — I132 Hypertensive heart and chronic kidney disease with heart failure and with stage 5 chronic kidney disease, or end stage renal disease: Secondary | ICD-10-CM

## 2018-02-22 LAB — RENAL FUNCTION PANEL
BLOOD UREA NITROGEN: 13 mg/dL (ref 6–24)
CALCIUM: 9.8 mg/dL (ref 8.7–10.2)
CHLORIDE: 107 mmol/L — ABNORMAL HIGH (ref 96–106)
CO2: 19 mmol/L — ABNORMAL LOW (ref 20–29)
CREATININE: 1.19 mg/dL — ABNORMAL HIGH (ref 0.57–1.00)
GFR MDRD AF AMER: 58 mL/min/{1.73_m2} — ABNORMAL LOW
GLUCOSE: 99 mg/dL (ref 65–99)
PHOSPHORUS, SERUM: 3 mg/dL (ref 2.5–4.5)
SODIUM: 142 mmol/L (ref 134–144)

## 2018-02-22 LAB — PHOSPHORUS, SERUM: Lab: 3

## 2018-02-22 LAB — MAGNESIUM: Lab: 1.5 — ABNORMAL LOW

## 2018-02-23 ENCOUNTER — Encounter: Admit: 2018-02-23 | Discharge: 2018-02-24 | Payer: 59 | Attending: Nephrology | Primary: Nephrology

## 2018-02-23 ENCOUNTER — Ambulatory Visit: Admit: 2018-02-23 | Discharge: 2018-02-24 | Payer: 59

## 2018-02-23 ENCOUNTER — Encounter
Admit: 2018-02-23 | Discharge: 2018-02-24 | Payer: 59 | Attending: Pharmacist Clinician (PhC)/ Clinical Pharmacy Specialist | Primary: Pharmacist Clinician (PhC)/ Clinical Pharmacy Specialist

## 2018-02-23 ENCOUNTER — Encounter: Admit: 2018-02-23 | Discharge: 2018-02-24 | Payer: 59 | Attending: Registered" | Primary: Registered"

## 2018-02-23 ENCOUNTER — Encounter: Admit: 2018-02-23 | Discharge: 2018-02-24 | Payer: 59

## 2018-02-23 DIAGNOSIS — Z79899 Other long term (current) drug therapy: Secondary | ICD-10-CM

## 2018-02-23 DIAGNOSIS — Z940 Kidney transplant status: Principal | ICD-10-CM

## 2018-02-23 DIAGNOSIS — R911 Solitary pulmonary nodule: Secondary | ICD-10-CM

## 2018-02-23 DIAGNOSIS — Z1159 Encounter for screening for other viral diseases: Secondary | ICD-10-CM

## 2018-02-23 DIAGNOSIS — D899 Disorder involving the immune mechanism, unspecified: Secondary | ICD-10-CM

## 2018-02-23 DIAGNOSIS — Z114 Encounter for screening for human immunodeficiency virus [HIV]: Secondary | ICD-10-CM

## 2018-02-23 DIAGNOSIS — Z4822 Encounter for aftercare following kidney transplant: Secondary | ICD-10-CM

## 2018-02-23 LAB — RENAL FUNCTION PANEL
ANION GAP: 9 mmol/L (ref 9–15)
BLOOD UREA NITROGEN: 16 mg/dL (ref 7–21)
BUN / CREAT RATIO: 14
CALCIUM: 10.2 mg/dL (ref 8.5–10.2)
CHLORIDE: 107 mmol/L (ref 98–107)
CO2: 23 mmol/L (ref 22.0–30.0)
EGFR CKD-EPI AA FEMALE: 62 mL/min/{1.73_m2} (ref >=60)
EGFR CKD-EPI NON-AA FEMALE: 54 mL/min/{1.73_m2} — ABNORMAL LOW (ref >=60)
GLUCOSE RANDOM: 93 mg/dL (ref 65–99)
PHOSPHORUS: 2.8 mg/dL — ABNORMAL LOW (ref 2.9–4.7)
SODIUM: 139 mmol/L (ref 135–145)

## 2018-02-23 LAB — CBC W/ AUTO DIFF
BASOPHILS ABSOLUTE COUNT: 0 10*9/L (ref 0.0–0.1)
BASOPHILS RELATIVE PERCENT: 1.4 %
EOSINOPHILS ABSOLUTE COUNT: 0.1 10*9/L (ref 0.0–0.4)
EOSINOPHILS RELATIVE PERCENT: 4.7 %
HEMOGLOBIN: 11.9 g/dL — ABNORMAL LOW (ref 12.0–16.0)
LARGE UNSTAINED CELLS: 2 % (ref 0–4)
LYMPHOCYTES ABSOLUTE COUNT: 0.2 10*9/L — ABNORMAL LOW (ref 1.5–5.0)
LYMPHOCYTES RELATIVE PERCENT: 8.7 %
MEAN CORPUSCULAR HEMOGLOBIN CONC: 31.2 g/dL (ref 31.0–37.0)
MEAN CORPUSCULAR HEMOGLOBIN: 29.7 pg (ref 26.0–34.0)
MEAN CORPUSCULAR VOLUME: 95.4 fL (ref 80.0–100.0)
MEAN PLATELET VOLUME: 9.7 fL (ref 7.0–10.0)
MONOCYTES ABSOLUTE COUNT: 0.1 10*9/L — ABNORMAL LOW (ref 0.2–0.8)
MONOCYTES RELATIVE PERCENT: 4.7 %
NEUTROPHILS ABSOLUTE COUNT: 2.1 10*9/L (ref 2.0–7.5)
NEUTROPHILS RELATIVE PERCENT: 78.4 %
PLATELET COUNT: 256 10*9/L (ref 150–440)
RED BLOOD CELL COUNT: 4.01 10*12/L (ref 4.00–5.20)
RED CELL DISTRIBUTION WIDTH: 18.4 % — ABNORMAL HIGH (ref 12.0–15.0)
WBC ADJUSTED: 2.7 10*9/L — ABNORMAL LOW (ref 4.5–11.0)

## 2018-02-23 LAB — URINALYSIS
BILIRUBIN UA: NEGATIVE
BLOOD UA: NEGATIVE
GLUCOSE UA: NEGATIVE
KETONES UA: NEGATIVE
LEUKOCYTE ESTERASE UA: NEGATIVE
NITRITE UA: NEGATIVE
PH UA: 5.5 (ref 5.0–9.0)
PROTEIN UA: NEGATIVE
RBC UA: <1 /HPF (ref <=4)
SPECIFIC GRAVITY UA: 1.014 (ref 1.003–1.030)
SQUAMOUS EPITHELIAL: <1 /HPF (ref 0–5)
UROBILINOGEN UA: 0.2
WBC UA: 1 /HPF (ref 0–5)

## 2018-02-23 LAB — PROTEIN/CREAT RATIO, URINE: Protein/Creatinine:MRto:Pt:Urine:Qn:: 0.144

## 2018-02-23 LAB — TACROLIMUS, TROUGH: Lab: 6.2

## 2018-02-23 LAB — NITRITE UA: Lab: NEGATIVE

## 2018-02-23 LAB — MAGNESIUM: Magnesium:MCnc:Pt:Ser/Plas:Qn:: 1.4 — ABNORMAL LOW

## 2018-02-23 LAB — BUN / CREAT RATIO: Urea nitrogen/Creatinine:MRto:Pt:Ser/Plas:Qn:: 14

## 2018-02-23 LAB — TACROLIMUS BLOOD
Lab: 6.2
Lab: 7.8

## 2018-02-23 LAB — SLIDE REVIEW

## 2018-02-23 LAB — BURR CELLS

## 2018-02-23 LAB — PROTEIN / CREATININE RATIO, URINE: PROTEIN URINE: 10.6 mg/dL

## 2018-02-23 LAB — WBC ADJUSTED: Lab: 2.7 — ABNORMAL LOW

## 2018-02-23 NOTE — Unmapped (Addendum)
Continue to drink at least 80 ounces of fluids daily  Eat 3 meals daily with appropriate protein, add more magnesium foods like unsalted nuts and dark green leafy vegetables  Exercise when you are able

## 2018-02-23 NOTE — Unmapped (Signed)
[facility] PHARMACY NOTE  02/23/2018   Danielle Parker  [id number]    Medication changes today:   1. DC hydralazine    Education/Adherence tools provided today:  1.provided updated medication list  2. provided additional pill box education  3.  provided additional education on immunosuppression and transplant related medications including reviewing indications of medications, dosing and side effects    Follow up items:  1. goal of understanding indications and dosing of immunosuppression medications  2. Consider increasing rosuvastatin to 10 mg daily  3.  Vitamin D level     Next visit with pharmacy in 1-3 months  ____________________________________________________________________    Danielle Parker is a 57 y.o. 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     Other PMH significant for hypertension    Seen by pharmacy today for: medication management and pill box fill and adherence education; last seen by pharmacy first visit     CC:  Patient has no complaints today     Vitals:    02/23/18 1037   BP: 124/68   Pulse: 68   Temp: 36.4 ??C (97.6 ??F)       Allergies   Allergen Reactions   ??? Buprenorphine Hcl Nausea And Vomiting   ??? Codeine Nausea And Vomiting   ??? Hydrocodone-Acetaminophen Nausea And Vomiting   ??? Morphine Nausea And Vomiting     Halllucinations     All medications reviewed and updated.     Medication list includes revisions made during today???s encounter    Outpatient Encounter Medications as of 02/23/2018   Medication Sig Dispense Refill   ??? acetaminophen (TYLENOL) 500 MG tablet TAKE 1 TO 2 TABLETS (500-1000MG ) BY MOUTH THREE TIMES DAILY AS NEEDED (Patient taking differently: TAKE 1 TO 2 TABLETS (500-1000MG ) BY MOUTH THREE TIMES DAILY AS NEEDED FOR PAIN) 100 tablet PRN   ??? acetaminophen (TYLENOL) 500 MG tablet TAKE 1 TO 2 TABLETS (500-1000MG ) BY MOUTH THREE TIMES DAILY AS NEEDED 100 tablet PRN   ??? albuterol (PROVENTIL HFA;VENTOLIN HFA) 90 mcg/actuation inhaler Inhale 2 puffs daily as needed for shortness of breath.      ??? amLODIPine (NORVASC) 10 MG tablet Take 1 tablet (10 mg total) by mouth daily. Use only as directed. 30 tablet 11   ??? aspirin (ECOTRIN) 81 MG tablet Take 81 mg by mouth daily.     ??? budesonide-formoterol (SYMBICORT) 80-4.5 mcg/actuation inhaler Inhale 2 puffs Two (2) times a day. 15 g 11   ??? DOK 100 mg capsule TAKE 1 CAPSULE BY MOUTH TWICE DAILY AS NEEDED FOR CONSTIPATION. 60 capsule PRN   ??? gabapentin (NEURONTIN) 300 MG capsule Take 300 mg by mouth nightly.     ??? hydrALAZINE (APRESOLINE) 25 MG tablet Take 1 tablet (25 mg total) by mouth Three (3) times a day. 90 tablet 0   ??? metoprolol tartrate (LOPRESSOR) 25 MG tablet Take 3 tablets (75 mg total) by mouth Two (2) times a day. 180 tablet 11   ??? MYFORTIC 180 mg EC tablet Take 3 tablets (540 mg total) by mouth Two (2) times a day. 180 tablet 11   ??? omeprazole (PRILOSEC) 40 MG capsule Take 1 capsule (40 mg total) by mouth daily. 90 capsule 3   ??? oxyCODONE-acetaminophen (PERCOCET) 10-325 mg per tablet Take 1 tablet by mouth daily as needed for pain. for up to 1 dose May fill today 02/11/18 30 tablet 0   ??? OXYGEN-AIR DELIVERY SYSTEMS MISC Inhale 3 L continuous.     ???  polyethylene glycol (MIRALAX) 17 gram packet Take 17 g by mouth daily as needed. (Patient taking differently: Take 17 g by mouth daily as needed. Indications: constipation) 30 each 0   ??? PROGRAF 1 mg capsule Take 5 mg every morning and 4 mg every evening, Z94.0, tx date 01/08/18 270 capsule 11   ??? rosuvastatin (CRESTOR) 5 MG tablet Take 1 tablet (5 mg total) by mouth daily. 90 tablet 3   ??? sodium bicarbonate 650 mg tablet Take 1 tablet (648 mg total) by mouth Two (2) times a day. 60 tablet 11   ??? sulfamethoxazole-trimethoprim (BACTRIM,SEPTRA) 400-80 mg per tablet Take 1 tablet (80 mg of trimethoprim total) by mouth 3 (three) times a week. 12 tablet 5   ??? valGANciclovir (VALCYTE) 450 mg tablet Take 1 tablet (450 mg total) by mouth daily. 30 tablet 1     No facility-administered encounter medications on file as of 02/23/2018.      Induction agent : alemtuzumab    CURRENT IMMUNOSUPPRESSION: tacrolimus 8 mg PO BID  prograf/cyclosporine goal: 8-10   myfortic540  mg PO bid (had spaced out previously d/t abdominal pain)   steroid free     Patient is tolerating immunosuppression well.     IMMUNOSUPPRESSION DRUG LEVELS:  Lab Results   Component Value Date    TACROLIMUS 7.8 02/21/2018    TACROLIMUS 10.2 02/16/2018    TACROLIMUS 9.5 02/11/2018    TACROLIMUS 6.9 01/28/2018    TACROLIMUS 6.0 01/26/2018    TACROLIMUS 6.9 01/24/2018     No results found for: CYCLO  No results found for: EVEROLIMUS  No results found for: SIROLIMUS    Did not have labs drawn today    Graft function: stable  DSA: ntd  Biopsies to date: ntd  WBC/ANC:  wnl    Plan:  Tolerating well. No changes.    ID prophylaxis:   CMV Status: D-/ R+, moderate risk. CMV prophylaxis: valganciclovir 450 mg daily x 3 months per protocol (end 04/10/18)  No results found for: CMVCP  PCP Prophylaxis: bactrim SS 1 tab MWF x 6 months. (end 07/10/18)  Thrush: completed in hospital  Patient is  tolerating infectious prophylaxis well    Plan:  Continue to monitor.    CV Prophylaxis: asa 81 mg   The 10-year ASCVD risk score Denman George DC Jr., et al., 2013) is: 14.4%  Statin therapy: Indicated; currently on rosuvastatin 5 mg daily  Plan: Consider uptitrating dose at later date to 10 mg. Continue to monitor     BP: Goal < 140/90. Clinic vitals reported above  Home BP ranges: 101-118/64-78 per patient.  Current meds include: amlodipine 10 mg daily, metoprolol 75 mg BID, hydralazine 25 mg TID  Plan: within goal. Stop hydralazine     Anemia:  H/H:   Lab Results   Component Value Date    HGB 11.9 (L) 02/23/2018     Lab Results   Component Value Date    HCT 38.2 02/23/2018     Iron panel:  No results found for: IRON, TIBC, FERRITIN  No results found for: LABIRON    Prior ESA use: none post transplant  Plan: within goal. Continue to monitor. DM:   Lab Results   Component Value Date    A1C 5.1 01/07/2018   . Goal A1c < 7  History of Dm? No  Diet: Did not address at this visit  Fluid intake: 64 oz water daily  Exercise:not currently exercising  Plan: Increase water intake to 80-100  oz.  Continue to monitor    Electrolytes: wnl  Meds currently on: none  Plan: Continue to monitor     GI/BM: pt reports abdominal pain has improved with restarting omeprazole and removal or ureteral stent last week   Meds currently on: Miralax PRN (has not been using), docusate BID (taking once daily), omeprazole 40 mg daily  Plan: Continue to monitor    Pain: pt reports no pain at incision site however has hx of neuropathy that she uses gabapentin for.   Meds currently on:  gabapentin 300 mg HS for neuropathy, pt was taking percoent but states she no longer uses it.  Plan: Continue to monitor     Bone health:   Vitamin D Level: none available. Goal > 30.   Last DEXA results:  none available  Current meds include: none  Plan: Vitamin D level  needs to be drawn with next lab schedule.  Continue to monitor.     Women's/Men's Health:  Lorijean Husser is a 57 y.o. Female perimenopausal. Patient reports no men's/women's health issues  Plan: Continue to monitor    Adherence: Patient has poor understanding of medications; was able to identify tacrolimus and Bactrim indications but didn't know Myfortic or Valcyte.   Patient knows indications of medications that she was taking prior to transplant.  Patient does require redirection to remain on topic.  Patient  reports that she has started to  fill their own pill box on a regular basis at home. Per transplant coordinator, home health RN is there 3 times weekly and may help with fill  Patient brought medication card:yes  Pill box: States that she uses the one provided by home health nurse.    Plan: Instructed Ms. Fordham to bring in all of her medicines and pill box that she uses so that pharmacist can review; provided extensive adherence counseling/intervention    Spent approximately 30 minutes on educating this patient and greater than 50% was spent in direct face to face counseling regarding post transplant medication education. Questions and concerns were address to patient's satisfaction.    Patient was reviewed with Dr. Nestor Lewandowsky who was agreement with the stated plan:     During this visit, the following was completed:   BG log data assessment  BP log data assessment  Labs ordered and evaluated  complex treatment plan >1 DS   Patient education was completed for 11-24 minutes     All questions/concerns were addressed to the patient's satisfaction.  __________________________________________  Cleone Slim, PHARMD  [facility]  PAGER [phone]

## 2018-02-23 NOTE — Unmapped (Signed)
Outpatient Adult Nutrition - Post Renal Transplant Follow-up     Referring MD or Clinic: Post Renal Transplant Clinic     Reason for Visit: Post Renal Transplant Evaluation Follow-up; renal transplant on 01/08/18    Changes from last RD visit on 01/10/18  Food safety guidelines  Improved appetite  Exercising more    Nutrition goals from last RD visit on 01/10/18    PMH:   Patient Active Problem List   Diagnosis   ??? ESRF (end stage renal failure) (CMS-HCC)   ??? Chronic pain syndrome   ??? Neuropathic pain   ??? Pain medication agreement signed   ??? Chronic pain following surgery or procedure   ??? Knee pain, left anterior   ??? Pain of left arm   ??? Chest pain   ??? Closed fracture of ankle   ??? Dysphonia   ??? Nicotine dependence, uncomplicated   ??? Kidney failure   ??? COPD (chronic obstructive pulmonary disease) (CMS-HCC)   ??? Hypertensive crisis   ??? Shortness of breath   ??? Kidney replaced by transplant       Anthropometrics:   February 23, 2018 Weight: 77.4 kg    Height:160 cm  BMI: 30.23  IBW: 52.21 kg    Pt is obese (class I) per BMI standards.        Previous Weights:     01/08/18 81.6 kg (179 lb 14.3 oz)   11/15/17 83 kg (183 lb)   08/25/17 83.3 kg (183 lb 9.6 oz)   02/17/17 86.8 kg (191 lb 4.8 oz)   12/02/16 79.7 kg (175 lb 9.6 oz)   10/28/16 79.1 kg (174 lb 6.1 oz)   10/22/16 81.9 kg (180 lb 8 oz)   08/06/16 81.1 kg (178 lb 14.4 oz)   08/05/16 83.4 kg (183 lb 12.8 oz)   05/13/16 78.5 kg (173 lb)            Weight Change: wt loss of 4.2 kg during past 6 weeks    Nutrition-Focused Physical Findings:   -- Muscle mass: no loss noted  ----- Areas Assessed: temple  -- Fat mass: no loss noted  ----- Areas Assessed: orbital    Relevant Medications, Herbs, Supplements include: Reviewed all nutritionally relevant medications and supplements   Prograf (aware of grapefruit, pomegranate, star fruit and limit clementine x 1 daily with food interaction)  Myfortic  Omeprazole  Rosuvastatin (aware of grapefruit interaction)   Sodium bicarbonate Relevant Labs:   Cret 1.13 (H), Mg 1.4 (L), P 2.8 (L) team aware and mointoring    Physical Activity: limited    Dietary Restrictions: food safety guidelines; possible lactose    Allergies:   Allergies   Allergen Reactions   ??? Buprenorphine Hcl Nausea And Vomiting   ??? Codeine Nausea And Vomiting   ??? Hydrocodone-Acetaminophen Nausea And Vomiting   ??? Morphine Nausea And Vomiting     Halllucinations        Usual Intake:   1st - eggs and toast with [place] bacon or [place] sausage with water or coffee cake or honey buns with coffee   Snack - peanut or cheese crackers (nabs) or peanut butter sandwich or homemade trail mix with peanuts with cranberry  2nd - grits with sugar and butter or bologna and cheese or roast beef sandwich with fruit (fruit cocktail or canned peaches) with water  Snack - cranberries and nuts or pb sandwich  3rd - baked chicken or chicken wings or pork or rice and broccoli flavored rice  or fried rice flavored or corn and green beans or pinto bean with neck bones   Snack - none  Or ice cream or pringles or Beverages - water    Other Usual Intake: bean salad; salads     Behavioral Risk Factors:  Emotional Eating  No  Meal Schedule  Yes  Meal Skipping  No  Grazing  Yes  Night Eating  No  Sleep  8 hours  Overeating  No    Hunger and Satiety: pt endorses good appetite    Nutrition History: denies c/s/n/v/d at this time; she cooks; has aide who will assist her with meals    Social: Lives with boyfriend    Estimated Daily Nutritional Needs:   Energy: 1610-9604 kcals [25-30 kcal/kg using adjusted body weight, 59.5 kg (01/10/18 1245)]  Protein: 71-89 gm [1.2-1.5 gm/kg using adjusted body weight, 59.5 kg (01/10/18 1245)]59  Carbohydrate:   [45-60% of kcal]  Fluid:   [per MD team]        Nutrition Assessment: Pt likely meeting calorie needs at this time, but not protein needs. Pt would benefit from lean protein, more non starchy vegetables with contain Mg, whole grains, lactose free dairy foods and adequate hydration.     Pt has experienced 5% wt loss during the past weekly which may be likely d/t fluids, weighing error or actual wt loss. Will need to monitor wts.    Pt would benefit to include more Mg and P containing foods in her usual intake to normalize these labs.     Pt assessed to have limited nutrition knowledge for post renal transplant MNT and would benefit from nutrition education.        Progress towards goals: normalization of Mg and P    Nutrition Diagnosis:  Malnutrition Assessment using AND/ASPEN Clinical Characteristics:    Patient does not meet AND/ASPEN criteria for malnutrition at this time (02/23/18 1041)                      Food and nutrition related knowledge deficit as related to limited knowledge and training for post renal transplant MNT as evidenced by per pt report.      Education: post renal transplant goals and expectations, brief review of food safety guidelines, hydration, strength and endurance, Mg containing foods    Nutrition Goals:  Meet nutritional needs   Maintain current wt  Normalization of Mg    Interventions:   Continue to drink at least 80 ounces of fluids daily  Eat 3 meals daily with appropriate protein, add more magnesium foods like unsalted nuts and dark green leafy vegetables  Exercise when you are able    Expected Compliance is:  Comprehension of plan good  Readiness for change good  Ability to meet goals good    Materials Provided were:  List of recommendations    Handout explaining prescribed diet  RD contact information  Nutrition following renal transplant  List of Mg containing foods    Follow-up: Next MD appointment, or when consulted; review P containing foods    Length of visit was: 30 minutes    Greta Doom, MS, RDN, CSG, LDN  [phone] pager

## 2018-02-23 NOTE — Unmapped (Signed)
[facility]   ACUTE/CHRONIC TRANSPLANT FOLLOW UP     PCP: Marton Redwood, MD     Date of Visit at Transplant clinic: 02/23/2018     Graft Status: stable.    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   Urine analysis: negative protein/ <1 RBC/ <1 WBC.  Urine protein/creatinine ratio: 0.144  DSA: not done yet.    Last CMV checked: today, pending.  Last BKV checked: today, pending.    CMV: D-/R+  Prophylaxis-  Valcyte x 3 months (end 04/10/18) and Bactrim SS MWF x 6 months (end 07/10/18)    2) Immunosuppression: Prograf 5/4 mg BID / Myfortic 540 BID.  - Prograf level 6.2 and Last dose of Prograf: 9  - Goal of 12 hour Prograf trough: 8-10  - Changes in Immunosuppression - Increase prograf to 5 mg BID.  - Medications side effects: No     3) HTN - Controlled  Goal for B.P - <130/80   Changes in B.P medications - discontinue hydralazine today      4) Anemia - stable   Goal for Hemoglobin > 13.0  Last Ferritin or Tsat if anemic.     5) MBD - Calcium/Phosphorus - WNL.  iPTH - not yet checked   Last Dexa scan - not checked     6) Electrolytes:  - WNL    7) Immunizations:   Influenza (inactivated only): 08/17/2017   Pneumococcal vaccination (inactivated only): will ask patient.    8) Cancer screening:  PAP smear - up to date   Mammogram - up to date   Colonoscopy - 2017 with 10 year follow up recommended     9) COPD - encourage using albuterol and Symbicort as prescribed.    Follow up: 5 weeks.      History of Presenting Illness:     Since patient's last visit in the transplant clinic - patient has been doing well in terms of transplant, taking transplant medications regularly, no episodes of rejection and no side effects of medications.    Patient presents today for follow up visit status post transplant 01/08/2018. She has been doing well post transplant. 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 She was on dialysis for 9 years prior to transplant. Denies family history of stroke or SAH. No history of liver disease.     Patient also has history of COPD. She quit smoking in 2017, previously smoked approximately 1/2 pack per day for greater than 30 years. She is using symbicort and albuterol every 3-4 days and reports symptoms are typically well managed. No wheezing. She has required hospitalization and intubation for COPD exacerbation in the past, last 2017. Reports occasional chest tightness with walking, attributes to head and poor compliance with inhaler.     She has history of hypertension almost all her life. She had prior 2 week event monitor May 2019 for episodes of palpitations. Results showed normal sinus rhythm with average HR of 69.    She is followed by [facility] pain clinic for chronic pain to left arm, specifically around the arm. 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 She continued to experience significant pain/cramping during dialysis. Pain was managed with percocet and gabapentin. She was last seen by pain clinic on 7/5 and percocet was refilled. However, per note this was patient's  last refill as they did not feel she required pain meds now she is off dialysis.     Stables removed today      Last dose of Prograf at: 9 PM   Diabetes: No:   HTN: Yes    Controlled:Yes    CAD/Heartfailure: Yes - catheterization 03/17/2016 showing on coronary disease     Last Echo/Ejection fraction: Echo 09/21/17 / EF 60-65%     Cardiologist: Dr. Julien Nordmann   Adherence      With Medication: yes     With Follow up: yes      Functional Status: Independent.    Review of Systems:     Fever or chills: negative   Sore throat: negative   Fatigue/malaise: negative   Weight loss or gain: negative   New skin rash/lump or bump: negative   Problems with teeth/gums: negative   Chest pain: negative   Cough or shortness of breath: negative   Swelling: negative   Belly pain/heartburn/nausea/vomiting or diarrhea: negative   Pain or bleeding when urinating: negative   Twitching/numbness or weakness: negative     Physical Exam:     BP 124/68 (BP Site: L Arm, BP Position: Sitting, BP Cuff Size: Medium)  - Pulse 68  - Temp 36.4 ??C (97.6 ??F) (Temporal)  - Ht 160 cm (5' 2.99)  - Wt 77.4 kg (170 lb 9.6 oz)  - BMI 30.23 kg/m??     Nursing note and vitals reviewed.   Constitutional: Oriented to person, place, and time. Appears well-developed and well-nourished. No distress.   HENT:   Head: Normocephalic and atraumatic.   Eyes: Right eye exhibits no discharge. Left eye exhibits no discharge. No scleral icterus.   Neck: Normal range of motion. Neck supple.   Cardiovascular: Normal rate and regular rhythm. Exam reveals no gallop and no friction rub. No murmur heard.   Pulmonary/Chest: Effort normal and breath sounds normal. No respiratory distress.   Abdominal: well healed ventral incision. Soft. Staples removed.   Musculoskeletal: Normal range of motion. No edema and no tenderness.   Neurological: Alert and oriented to person, place, and time.   Skin: Skin is warm and dry. No rash noted. Not diaphoretic. No erythema. No pallor.   Psychiatric: Normal mood and affect. Behavior is normal. Judgment and thought content normal.     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   Induction: [place]    Maintenance IS at the time of transplant: methylprednisolone taper which ended on 01/11/18, Cellcept 750mg  BID, and Prograf titrated up to 8mg  BID by day of discharge.   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 Patient treated on 7 day course of PO ciprofoxacin, completed on 01/16/18.     Allergies:   Allergies   Allergen Reactions   ??? Buprenorphine Hcl Nausea And Vomiting   ??? Codeine Nausea And Vomiting   ??? Hydrocodone-Acetaminophen Nausea And Vomiting   ???  Morphine Nausea And Vomiting     Halllucinations        Current Medications:   Current Outpatient Medications   Medication Sig Dispense Refill   ??? acetaminophen (TYLENOL) 500 MG tablet TAKE 1 TO 2 TABLETS (500-1000MG ) BY MOUTH THREE TIMES DAILY AS NEEDED (Patient taking differently: TAKE 1 TO 2 TABLETS (500-1000MG ) BY MOUTH THREE TIMES DAILY AS NEEDED FOR PAIN) 100 tablet PRN   ??? acetaminophen (TYLENOL) 500 MG tablet TAKE 1 TO 2 TABLETS (500-1000MG ) BY MOUTH THREE TIMES DAILY AS NEEDED 100 tablet PRN   ??? albuterol (PROVENTIL HFA;VENTOLIN HFA) 90 mcg/actuation inhaler Inhale 2 puffs daily as needed for shortness of breath.      ??? amLODIPine (NORVASC) 10 MG tablet Take 1 tablet (10 mg total) by mouth daily. Use only as directed. 30 tablet 11   ??? aspirin (ECOTRIN) 81 MG tablet Take 81 mg by mouth daily.     ??? budesonide-formoterol (SYMBICORT) 80-4.5 mcg/actuation inhaler Inhale 2 puffs Two (2) times a day. 15 g 11   ??? DOK 100 mg capsule TAKE 1 CAPSULE BY MOUTH TWICE DAILY AS NEEDED FOR CONSTIPATION. 60 capsule PRN   ??? gabapentin (NEURONTIN) 300 MG capsule Take 300 mg by mouth nightly.     ??? hydrALAZINE (APRESOLINE) 25 MG tablet Take 1 tablet (25 mg total) by mouth Three (3) times a day. 90 tablet 0   ??? metoprolol tartrate (LOPRESSOR) 25 MG tablet Take 3 tablets (75 mg total) by mouth Two (2) times a day. 180 tablet 11   ??? MYFORTIC 180 mg EC tablet Take 3 tablets (540 mg total) by mouth Two (2) times a day. 180 tablet 11   ??? omeprazole (PRILOSEC) 40 MG capsule Take 1 capsule (40 mg total) by mouth daily. 90 capsule 3   ??? oxyCODONE-acetaminophen (PERCOCET) 10-325 mg per tablet Take 1 tablet by mouth daily as needed for pain. for up to 1 dose May fill today 02/11/18 30 tablet 0   ??? OXYGEN-AIR DELIVERY SYSTEMS MISC Inhale 3 L continuous.     ??? polyethylene glycol (MIRALAX) 17 gram packet Take 17 g by mouth daily as needed. (Patient taking differently: Take 17 g by mouth daily as needed. Indications: constipation) 30 each 0   ??? PROGRAF 1 mg capsule Take 5 mg every morning and 4 mg every evening, Z94.0, tx date 01/08/18 270 capsule 11   ??? rosuvastatin (CRESTOR) 5 MG tablet Take 1 tablet (5 mg total) by mouth daily. 90 tablet 3   ??? sodium bicarbonate 650 mg tablet Take 1 tablet (648 mg total) by mouth Two (2) times a day. 60 tablet 11   ??? sulfamethoxazole-trimethoprim (BACTRIM,SEPTRA) 400-80 mg per tablet Take 1 tablet (80 mg of trimethoprim total) by mouth 3 (three) times a week. 12 tablet 5   ??? valGANciclovir (VALCYTE) 450 mg tablet Take 1 tablet (450 mg total) by mouth daily. 30 tablet 1     No current facility-administered medications for this visit.        Past Medical History:   Past Medical History:   Diagnosis Date   ??? Abnormal mammogram    ??? Asthma    ??? Chronic kidney disease    ??? COPD (chronic obstructive pulmonary disease) (CMS-HCC)    ??? GERD (gastroesophageal reflux disease)    ??? Hypertension    ??? Migraine    ??? Nephrotic syndrome         Laboratory studies:     Recent Results (from the past 170  hour(s))   CBC w/ Differential    Collection Time: 02/21/18  9:11 AM   Result Value Ref Range    WBC 2.4 (LL) 3.4 - 10.8 x10E3/uL    RBC 3.95 3.77 - 5.28 x10E6/uL    HGB 11.4 11.1 - 15.9 g/dL    HCT 24.4 01.0 - 27.2 %    MCV 90 79 - 97 fL    MCH 28.9 26.6 - 33.0 pg    MCHC 32.0 31.5 - 35.7 g/dL    RDW 53.6 (H) 64.4 - 15.4 %    Platelet 234 150 - 450 x10E3/uL    Neutrophils % 83 Not Estab. %    Lymphocytes % 7 Not Estab. %    Monocytes % 5 Not Estab. %    Eosinophils % 4 Not Estab. %    Basophils % 1 Not Estab. %    Absolute Neutrophils 2.0 1.4 - 7.0 x10E3/uL    Absolute Lymphocytes 0.2 (L) 0.7 - 3.1 x10E3/uL    Absolute Monocytes  0.1 0.1 - 0.9 x10E3/uL    Absolute Eosinophils 0.1 0.0 - 0.4 x10E3/uL    Absolute Basophils  0.0 0.0 - 0.2 x10E3/uL    Immature Granulocytes 0 Not Estab. %    Bands Absolute 0.0 0.0 - 0.1 x10E3/uL   Magnesium Level    Collection Time: 02/21/18  9:11 AM   Result Value Ref Range    Magnesium 1.5 (L) 1.6 - 2.3 mg/dL   Tacrolimus level    Collection Time: 02/21/18  9:11 AM   Result Value Ref Range    Tacrolimus Lvl 7.8 2.0 - 20.0 ng/mL   Renal Function Panel    Collection Time: 02/21/18  9:11 AM   Result Value Ref Range    Glucose 99 65 - 99 mg/dL    BUN 13 6 - 24 mg/dL    Creatinine 0.34 (H) 0.57 - 1.00 mg/dL    GFR MDRD Non Af Amer 50 (L) >59 mL/min/1.73    GFR MDRD Af Amer 58 (L) >59 mL/min/1.73    BUN/Creatinine Ratio 11 9 - 23    Sodium 142 134 - 144 mmol/L    Potassium 4.6 3.5 - 5.2 mmol/L    Chloride 107 (H) 96 - 106 mmol/L    CO2 19 (L) 20 - 29 mmol/L    Calcium 9.8 8.7 - 10.2 mg/dL    Phosphorus, Serum 3.0 2.5 - 4.5 mg/dL    Albumin 4.3 3.5 - 5.5 g/dL   Tacrolimus Level, Trough    Collection Time: 02/23/18  9:11 AM   Result Value Ref Range    Tacrolimus, Trough 6.2 5.0 - 15.0 ng/mL   Renal Function Panel    Collection Time: 02/23/18  9:11 AM   Result Value Ref Range    Sodium 139 135 - 145 mmol/L    Potassium 4.3 3.5 - 5.0 mmol/L    Chloride 107 98 - 107 mmol/L    CO2 23.0 22.0 - 30.0 mmol/L    Anion Gap 9 9 - 15 mmol/L    BUN 16 7 - 21 mg/dL    Creatinine 7.42 (H) 0.60 - 1.00 mg/dL    BUN/Creatinine Ratio 14     EGFR CKD-EPI Non-African American, Female 54 (L) >=60 mL/min/1.64m2    EGFR CKD-EPI African American, Female 10 >=60 mL/min/1.13m2    Glucose 93 65 - 99 mg/dL    Calcium 59.5 8.5 - 63.8 mg/dL    Phosphorus 2.8 (L) 2.9 - 4.7 mg/dL  Albumin 4.4 3.5 - 5.0 g/dL   Magnesium Level    Collection Time: 02/23/18  9:11 AM   Result Value Ref Range    Magnesium 1.4 (L) 1.6 - 2.2 mg/dL   Tacrolimus level    Collection Time: 02/23/18  9:11 AM   Result Value Ref Range    Tacrolimus, Timed 6.2 ng/mL   CBC w/ Differential    Collection Time: 02/23/18  9:11 AM   Result Value Ref Range    WBC 2.7 (L) 4.5 - 11.0 10*9/L    RBC 4.01 4.00 - 5.20 10*12/L    HGB 11.9 (L) 12.0 - 16.0 g/dL    HCT 82.9 56.2 - 13.0 %    MCV 95.4 80.0 - 100.0 fL    MCH 29.7 26.0 - 34.0 pg    MCHC 31.2 31.0 - 37.0 g/dL    RDW 86.5 (H) 78.4 - 15.0 %    MPV 9.7 7.0 - 10.0 fL    Platelet 256 150 - 440 10*9/L    Neutrophils % 78.4 %    Lymphocytes % 8.7 %    Monocytes % 4.7 %    Eosinophils % 4.7 %    Basophils % 1.4 %    Absolute Neutrophils 2.1 2.0 - 7.5 10*9/L    Absolute Lymphocytes 0.2 (L) 1.5 - 5.0 10*9/L    Absolute Monocytes 0.1 (L) 0.2 - 0.8 10*9/L    Absolute Eosinophils 0.1 0.0 - 0.4 10*9/L    Absolute Basophils 0.0 0.0 - 0.1 10*9/L    Large Unstained Cells 2 0 - 4 %    Microcytosis Slight (A) Not Present    Macrocytosis Slight (A) Not Present    Anisocytosis Moderate (A) Not Present    Hypochromasia Marked (A) Not Present   Morphology Review    Collection Time: 02/23/18  9:11 AM   Result Value Ref Range    Smear Review Comments See Comment (A) Undefined    Burr Cells Present (A) Not Present    Toxic Vacuolation Present (A) Not Present   Protein/Creatinine Ratio, Urine    Collection Time: 02/23/18 10:03 AM   Result Value Ref Range    Creat U 73.5 Undefined mg/dL    Protein, Ur 69.6 Undefined mg/dL    Protein/Creatinine Ratio, Urine 0.144 Undefined   Urine Culture    Collection Time: 02/23/18 10:03 AM   Result Value Ref Range    Urine Culture, Comprehensive Mixed Urogenital Flora    Urinalysis    Collection Time: 02/23/18 10:03 AM   Result Value Ref Range    Color, UA Light Yellow     Clarity, UA Clear     Specific Gravity, UA 1.014 1.003 - 1.030    pH, UA 5.5 5.0 - 9.0    Leukocyte Esterase, UA Negative Negative    Nitrite, UA Negative Negative    Protein, UA Negative Negative    Glucose, UA Negative Negative    Ketones, UA Negative Negative    Urobilinogen, UA 0.2 mg/dL 0.2 mg/dL, 1.0 mg/dL    Bilirubin, UA Negative Negative    Blood, UA Negative Negative    RBC, UA <1 <=4 /HPF    WBC, UA 1 0 - 5 /HPF Squam Epithel, UA <1 0 - 5 /HPF    Bacteria, UA None Seen None Seen /HPF    Mucus, UA Rare (A) None Seen /HPF     Scribe's Attestation: Leeroy Bock, MD obtained and performed the history, physical exam and medical decision making  elements that were entered into the chart.  Signed by Jerl Santos, Scribe, on February 23, 2018 at 10:34 AM.    Documentation assistance provided by the Scribe. I was present during the time the encounter was recorded. The information recorded by the Scribe was done at my direction and has been reviewed and validated by me.

## 2018-02-24 LAB — HIV ANTIGEN/ANTIBODY COMBO: HIV 1+2 Ab+HIV1 p24 Ag:PrThr:Pt:Ser/Plas:Ord:IA: NONREACTIVE

## 2018-02-24 LAB — CMV DNA, QUANTITATIVE, PCR: CMV VIRAL LD: NOT DETECTED

## 2018-02-24 LAB — HEPATITIS B SURFACE ANTIGEN: Hepatitis B virus surface Ag:PrThr:Pt:Ser:Ord:: NONREACTIVE

## 2018-02-24 LAB — CMV QUANT LOG10: Lab: 0

## 2018-02-24 NOTE — Unmapped (Signed)
[facility] Refill Coordination Note  Specialty Medication(s): Prograf 1mg , Valgancyclovir 450mg   Additional Medications shipped: Metoprolol, Mg Plus Protein, Bactrim    Danielle Parker, DOB: Nov 17, 1960  Phone: [phone] (home) , Alternate phone contact: N/A  Phone or address changes today?: No  All above HIPAA information was verified with patient.  Shipping Address: [address]  [place] [address]   Insurance changes? No    Completed refill call assessment today to schedule patient's medication shipment from the [facility] [phone]).      Confirmed the medication and dosage are correct and have not changed: Yes, regimen is correct and unchanged.    Confirmed patient started or stopped the following medications in the past month:  No, there are no changes reported at this time.    Are you tolerating your medication?:  Danielle Parker reports tolerating the medication.    ADHERENCE    (Below is required for Medicare Part B or Transplant patients only - per drug):   Prograf 1 mg   Quantity filled last month: 330   # of tablets left on hand: 154      Did you miss any doses in the past 4 weeks? No missed doses reported.    FINANCIAL/SHIPPING    Delivery Scheduled: Yes, Expected medication delivery date: 02/28/2018     The patient will receive an FSI print out for each medication shipped and additional FDA Medication Guides as required.  Patient education from [place] or Robet Leu may also be included in the shipment    Danielle Parker did not have any additional questions at this time.    Delivery address validated in FSI scheduling system: Yes, address listed in FSI is correct.    We will follow up with patient monthly for standard refill processing and delivery.      Thank you,  Danielle Parker  Danielle Parker   [facility] Specialty Pharmacist

## 2018-02-25 LAB — HCV RNA COMMENT: Lab: 0

## 2018-02-25 LAB — HEPATITIS C RNA, QUANTITATIVE, PCR: HCV RNA: NOT DETECTED

## 2018-02-25 MED FILL — SULFAMETHOXAZOLE/TRIMETHO/400-80MG/TABS: SULFAMETHOXAZOLE/TRIMETHO/400-80MG/TABS | 28 days supply | Qty: 12 | Fill #0

## 2018-02-25 MED FILL — METOPROLOL TARTRATE/25MG/TABS: METOPROLOL TARTRATE/25MG/TABS | 30 days supply | Qty: 180 | Fill #0

## 2018-02-25 MED FILL — MG PLUS PROTEIN 133 MG/133 MG/TABS: MG PLUS PROTEIN 133 MG/133 MG/TABS | 50 days supply | Qty: 100 | Fill #0

## 2018-02-25 MED FILL — PROGRAF/1MG/CAP: PROGRAF/1MG/CAP | 30 days supply | Qty: 330 | Fill #1

## 2018-02-28 MED FILL — VALGANCICLOVIR/450MG/TABS: VALGANCICLOVIR/450MG/TABS | 30 days supply | Qty: 30 | Fill #1

## 2018-03-01 MED ORDER — PROGRAF 1 MG CAPSULE
ORAL_CAPSULE | 11 refills | 0 days
Start: 2018-03-01 — End: 2018-03-24

## 2018-03-01 NOTE — Unmapped (Signed)
Per Dr Nestor Lewandowsky, will increase prograf to 5mg  bid, pt verbalized understanding.

## 2018-03-03 LAB — CBC W/ DIFFERENTIAL
BANDED NEUTROPHILS ABSOLUTE COUNT: 0 10*3/uL (ref 0.0–0.1)
BASOPHILS ABSOLUTE COUNT: 0.1 10*3/uL (ref 0.0–0.2)
EOSINOPHILS ABSOLUTE COUNT: 0.1 10*3/uL (ref 0.0–0.4)
EOSINOPHILS RELATIVE PERCENT: 7 %
HEMATOCRIT: 37.2 % (ref 34.0–46.6)
HEMOGLOBIN: 12.3 g/dL (ref 11.1–15.9)
IMMATURE GRANULOCYTES: 2 %
LYMPHOCYTES ABSOLUTE COUNT: 0.2 10*3/uL — ABNORMAL LOW (ref 0.7–3.1)
LYMPHOCYTES RELATIVE PERCENT: 9 %
MEAN CORPUSCULAR HEMOGLOBIN CONC: 33.1 g/dL (ref 31.5–35.7)
MEAN CORPUSCULAR VOLUME: 92 fL (ref 79–97)
MONOCYTES ABSOLUTE COUNT: 0.1 10*3/uL (ref 0.1–0.9)
MONOCYTES RELATIVE PERCENT: 7 %
NEUTROPHILS ABSOLUTE COUNT: 1.4 10*3/uL (ref 1.4–7.0)
NEUTROPHILS RELATIVE PERCENT: 72 %
RED BLOOD CELL COUNT: 4.06 x10E6/uL (ref 3.77–5.28)
RED CELL DISTRIBUTION WIDTH: 18.2 % — ABNORMAL HIGH (ref 12.3–15.4)
WHITE BLOOD CELL COUNT: 1.8 10*3/uL — CL (ref 3.4–10.8)

## 2018-03-03 LAB — LYMPHOCYTES RELATIVE PERCENT: Lab: 9

## 2018-03-03 LAB — HBV DNA COMMENT: Lab: 0

## 2018-03-04 LAB — MAGNESIUM: Lab: 1.6

## 2018-03-04 LAB — RENAL FUNCTION PANEL
ALBUMIN: 4.2 g/dL (ref 3.5–5.5)
BLOOD UREA NITROGEN: 14 mg/dL (ref 6–24)
CALCIUM: 9.9 mg/dL (ref 8.7–10.2)
CHLORIDE: 105 mmol/L (ref 96–106)
CO2: 20 mmol/L (ref 20–29)
CREATININE: 1.2 mg/dL — ABNORMAL HIGH (ref 0.57–1.00)
GFR MDRD NON AF AMER: 50 mL/min/{1.73_m2} — ABNORMAL LOW
GLUCOSE: 84 mg/dL (ref 65–99)
PHOSPHORUS, SERUM: 3.6 mg/dL (ref 2.5–4.5)
POTASSIUM: 4.6 mmol/L (ref 3.5–5.2)
SODIUM: 140 mmol/L (ref 134–144)

## 2018-03-04 LAB — GFR MDRD NON AF AMER: Lab: 50 — ABNORMAL LOW

## 2018-03-04 MED ORDER — VALGANCICLOVIR 450 MG TABLET
ORAL_TABLET | Freq: Every day | ORAL | 1 refills | 0 days
Start: 2018-03-04 — End: 2018-04-12

## 2018-03-04 NOTE — Unmapped (Signed)
Labs reviewed with Dr. Nestor Lewandowsky, will hold her valcyte for now as her WBC is 1.8

## 2018-03-05 LAB — TACROLIMUS BLOOD: Lab: 10.4

## 2018-03-07 LAB — CBC W/ DIFFERENTIAL
BANDED NEUTROPHILS ABSOLUTE COUNT: 0.1 10*3/uL (ref 0.0–0.1)
BASOPHILS ABSOLUTE COUNT: 0.1 10*3/uL (ref 0.0–0.2)
BASOPHILS RELATIVE PERCENT: 3 %
EOSINOPHILS ABSOLUTE COUNT: 0.1 10*3/uL (ref 0.0–0.4)
EOSINOPHILS RELATIVE PERCENT: 4 %
HEMATOCRIT: 35.5 % (ref 34.0–46.6)
HEMOGLOBIN: 11.4 g/dL (ref 11.1–15.9)
LYMPHOCYTES ABSOLUTE COUNT: 0.2 10*3/uL — ABNORMAL LOW (ref 0.7–3.1)
LYMPHOCYTES RELATIVE PERCENT: 8 %
MEAN CORPUSCULAR HEMOGLOBIN CONC: 32.1 g/dL (ref 31.5–35.7)
MEAN CORPUSCULAR HEMOGLOBIN: 29.3 pg (ref 26.6–33.0)
MEAN CORPUSCULAR VOLUME: 91 fL (ref 79–97)
MONOCYTES RELATIVE PERCENT: 5 %
NEUTROPHILS RELATIVE PERCENT: 78 %
PLATELET COUNT: 200 10*3/uL (ref 150–450)
RED BLOOD CELL COUNT: 3.89 x10E6/uL (ref 3.77–5.28)
RED CELL DISTRIBUTION WIDTH: 18.1 % — ABNORMAL HIGH (ref 12.3–15.4)
WHITE BLOOD CELL COUNT: 3 10*3/uL — ABNORMAL LOW (ref 3.4–10.8)

## 2018-03-07 LAB — NEUTROPHILS RELATIVE PERCENT: Lab: 78

## 2018-03-08 LAB — RENAL FUNCTION PANEL
BLOOD UREA NITROGEN: 14 mg/dL (ref 6–24)
CALCIUM: 9.8 mg/dL (ref 8.7–10.2)
CHLORIDE: 106 mmol/L (ref 96–106)
CO2: 19 mmol/L — ABNORMAL LOW (ref 20–29)
CREATININE: 1.25 mg/dL — ABNORMAL HIGH (ref 0.57–1.00)
GFR MDRD AF AMER: 54 mL/min/{1.73_m2} — ABNORMAL LOW
GLUCOSE: 84 mg/dL (ref 65–99)
PHOSPHORUS, SERUM: 3.2 mg/dL (ref 2.5–4.5)
POTASSIUM: 4.6 mmol/L (ref 3.5–5.2)
SODIUM: 140 mmol/L (ref 134–144)

## 2018-03-08 LAB — MAGNESIUM: Lab: 1.6

## 2018-03-08 LAB — CHLORIDE: Lab: 106

## 2018-03-08 LAB — TACROLIMUS BLOOD: Lab: 8.7

## 2018-03-08 MED ORDER — VENTOLIN HFA 90 MCG/ACTUATION AEROSOL INHALER
1 refills | 0 days
Start: 2018-03-08 — End: 2018-06-15

## 2018-03-08 MED ORDER — BUDESONIDE-FORMOTEROL HFA 80 MCG-4.5 MCG/ACTUATION AEROSOL INHALER: 2 | g | Freq: Two times a day (BID) | 11 refills | 0 days | Status: AC

## 2018-03-08 MED ORDER — ALBUTEROL SULFATE HFA 90 MCG/ACTUATION AEROSOL INHALER
Freq: Every day | RESPIRATORY_TRACT | 1 refills | 0 days | Status: CP | PRN
Start: 2018-03-08 — End: 2018-03-08
  Filled 2018-05-19: qty 18, 25d supply, fill #0

## 2018-03-08 MED ORDER — BUDESONIDE-FORMOTEROL HFA 80 MCG-4.5 MCG/ACTUATION AEROSOL INHALER
Freq: Two times a day (BID) | RESPIRATORY_TRACT | 11 refills | 0.00000 days | Status: CP
Start: 2018-03-08 — End: 2018-03-08

## 2018-03-08 MED ORDER — GABAPENTIN 300 MG CAPSULE
ORAL_CAPSULE | Freq: Every evening | ORAL | 0 refills | 0.00000 days | Status: CP
Start: 2018-03-08 — End: 2018-03-08

## 2018-03-08 MED ORDER — GABAPENTIN 300 MG CAPSULE: 300 mg | capsule | 0 refills | 0 days

## 2018-03-08 NOTE — Unmapped (Signed)
Patient called IVR for refills  Then called our line and spoke with me  After figuring out what she needed I realized we didn't have rx's on file for gabapentin, symbicort or albuterol  Patient says we have it cause it's in MyChart-attempted to explain to her that we are separate from mychart and asked if they could've been sent somewhere else and she said no it should be us-she can see we have it-we do not  Sending refill request for rx's    [facility] Refill Coordination Note    Specialty Medication(s) to be Shipped:   Transplant: Myfortic 180mg     Other medication(s) to be shipped: amlodipine, omeprazole (epic message for gabapentin, symbicort, albuterol)     Danielle Parker, DOB: [birth year]  Phone: [phone] (home)   Shipping Address: [address]  [place] [address]    All above HIPAA information was verified with patient.     Completed refill call assessment today to schedule patient's medication shipment from the [facility] [phone]).       Specialty medication(s) and dose(s) confirmed: Regimen is correct and unchanged.   Changes to medications: Danielle Parker reports no changes reported at this time.  Changes to insurance: No  Questions for the pharmacist: No    The patient will receive an FSI print out for each medication shipped and additional FDA Medication Guides as required.  Patient education from [place] or Robet Leu may also be included in the shipment.    DISEASE/MEDICATION-SPECIFIC INFORMATION        patient will be out of myfortic friday    ADHERENCE     Medication Adherence    Patient reported X missed doses in the last month:  0  Specialty Medication:  myfortic  Patient is on additional specialty medications:  No  Patient is on more than two specialty medications:  No  Any gaps in refill history greater than 2 weeks in the last 3 months:  no  Demonstrates understanding of importance of adherence:  yes  Informant:  patient  Reliability of informant:  reliable Confirmed plan for next specialty medication refill:  delivery by pharmacy  Refills needed for supportive medications:  not needed          Refill Coordination    Has the Patients' Contact Information Changed:  No  Is the Shipping Address Different:  No         MEDICARE PART B DOCUMENTATION     Myfortic 180mg : Patient has 18 tablets on hand.    SHIPPING     Shipping address confirmed in FSI.     Delivery Scheduled: Yes, Expected medication delivery date: 8/1 via UPS or courier.     [name]   [facility] Specialty Technician

## 2018-03-09 MED FILL — SYMBICORT/80/4.5/AER: SYMBICORT/80/4.5/AER | 30 days supply | Qty: 1 | Fill #0

## 2018-03-09 MED FILL — VENTOLIN HFA/90MCG/AER: VENTOLIN HFA/90MCG/AER | 30 days supply | Qty: 1 | Fill #0

## 2018-03-09 MED FILL — GABAPENTIN/300MG/CAPS: GABAPENTIN/300MG/CAPS | 30 days supply | Qty: 30 | Fill #0

## 2018-03-09 MED FILL — MYFORTIC/180MG/TAB: MYFORTIC/180MG/TAB | 30 days supply | Qty: 180 | Fill #1

## 2018-03-10 LAB — CBC W/ DIFFERENTIAL
BANDED NEUTROPHILS ABSOLUTE COUNT: 0.1 10*3/uL (ref 0.0–0.1)
BASOPHILS ABSOLUTE COUNT: 0.1 10*3/uL (ref 0.0–0.2)
EOSINOPHILS RELATIVE PERCENT: 3 %
HEMATOCRIT: 35.6 % (ref 34.0–46.6)
HEMOGLOBIN: 11.8 g/dL (ref 11.1–15.9)
IMMATURE GRANULOCYTES: 2 %
LYMPHOCYTES ABSOLUTE COUNT: 0.3 10*3/uL — ABNORMAL LOW (ref 0.7–3.1)
LYMPHOCYTES RELATIVE PERCENT: 9 %
MEAN CORPUSCULAR HEMOGLOBIN CONC: 33.1 g/dL (ref 31.5–35.7)
MEAN CORPUSCULAR HEMOGLOBIN: 30 pg (ref 26.6–33.0)
MONOCYTES ABSOLUTE COUNT: 0.3 10*3/uL (ref 0.1–0.9)
MONOCYTES RELATIVE PERCENT: 7 %
NEUTROPHILS ABSOLUTE COUNT: 2.8 10*3/uL (ref 1.4–7.0)
NEUTROPHILS RELATIVE PERCENT: 77 %
PLATELET COUNT: 223 10*3/uL (ref 150–450)
RED BLOOD CELL COUNT: 3.93 x10E6/uL (ref 3.77–5.28)
RED CELL DISTRIBUTION WIDTH: 17.6 % — ABNORMAL HIGH (ref 12.3–15.4)
WHITE BLOOD CELL COUNT: 3.6 10*3/uL (ref 3.4–10.8)

## 2018-03-10 LAB — MEAN CORPUSCULAR VOLUME: Lab: 91

## 2018-03-10 IMAGING — DX DG ABDOMEN 1V
1 series · 2 of 2 positions shown · non-contrast
Comparison: None.

CLINICAL DATA: NG tube placement

EXAM:
ABDOMEN - 1 VIEW

[Series 1: abdomen kub · 0.14mm/px · 2 of 2 slices shown]
[im 1/2]
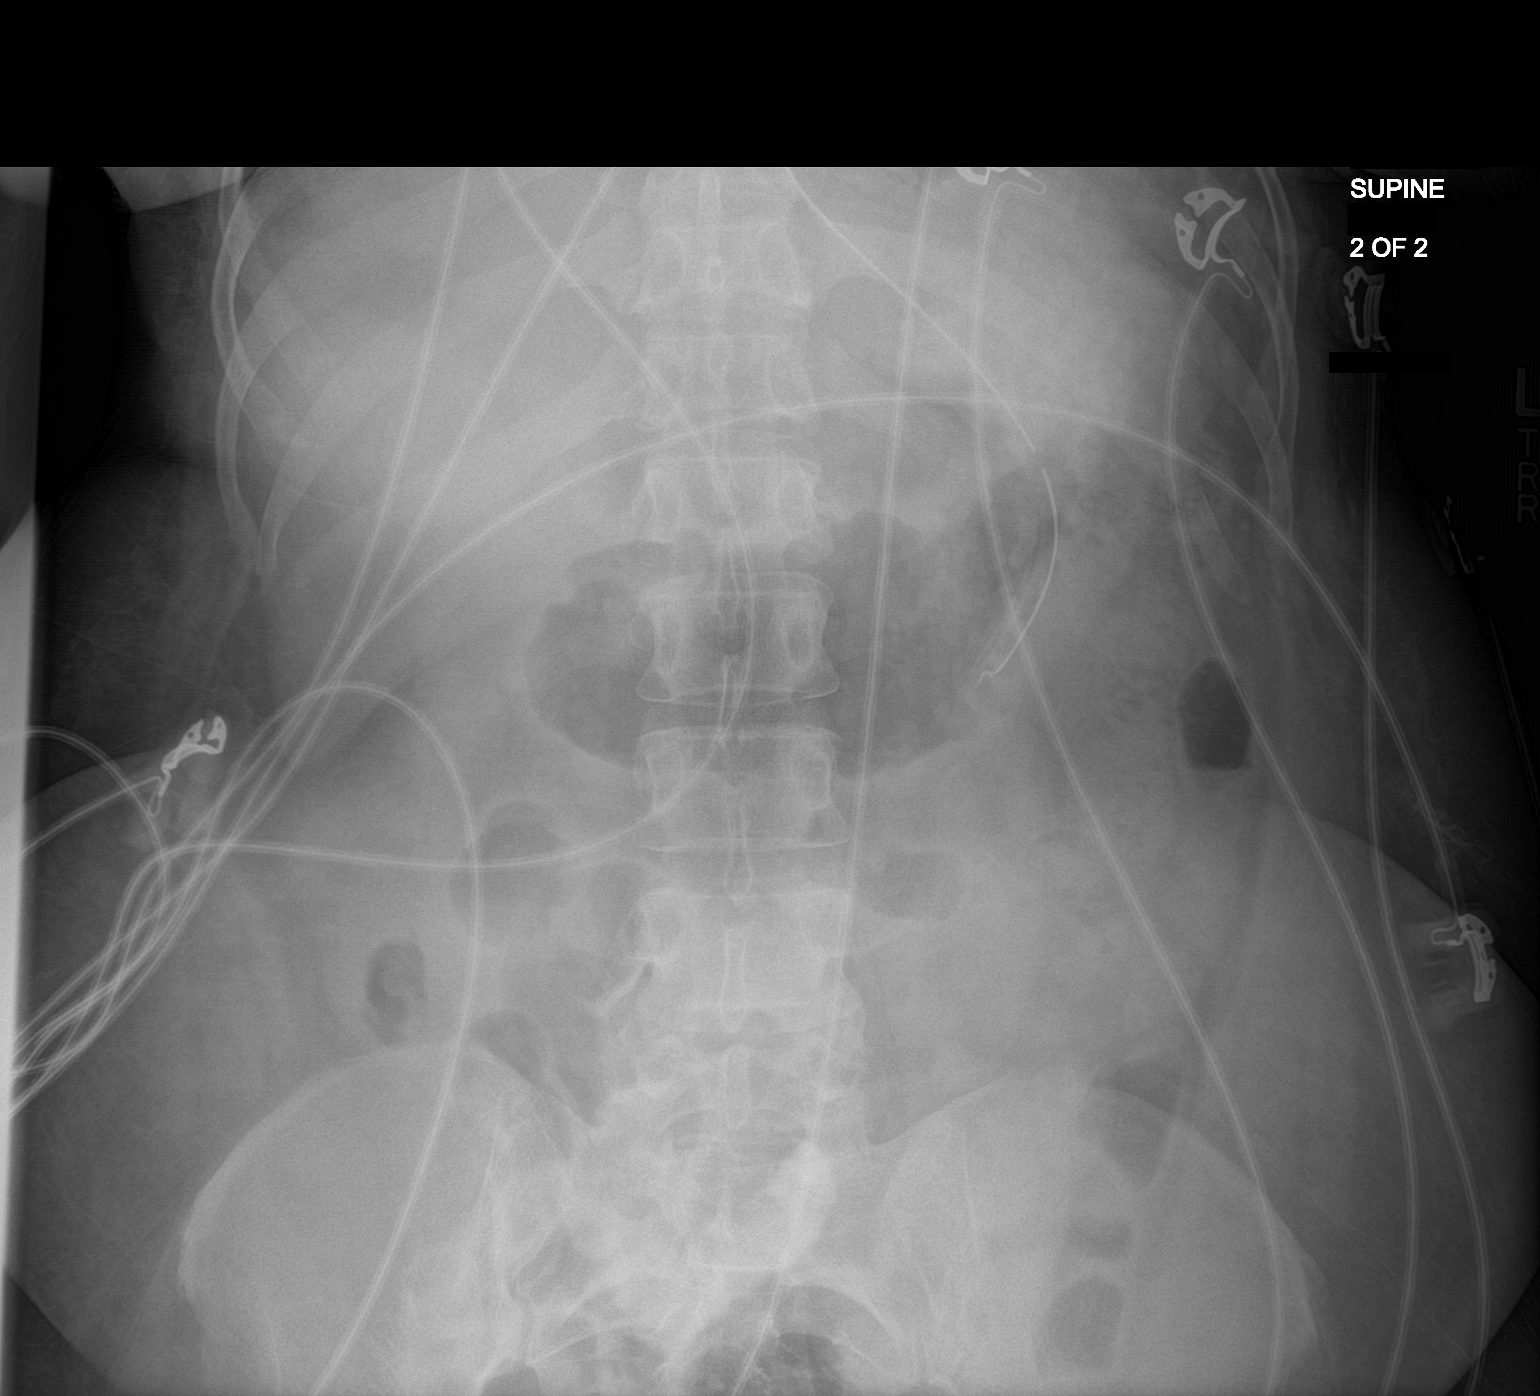
[im 2/2]
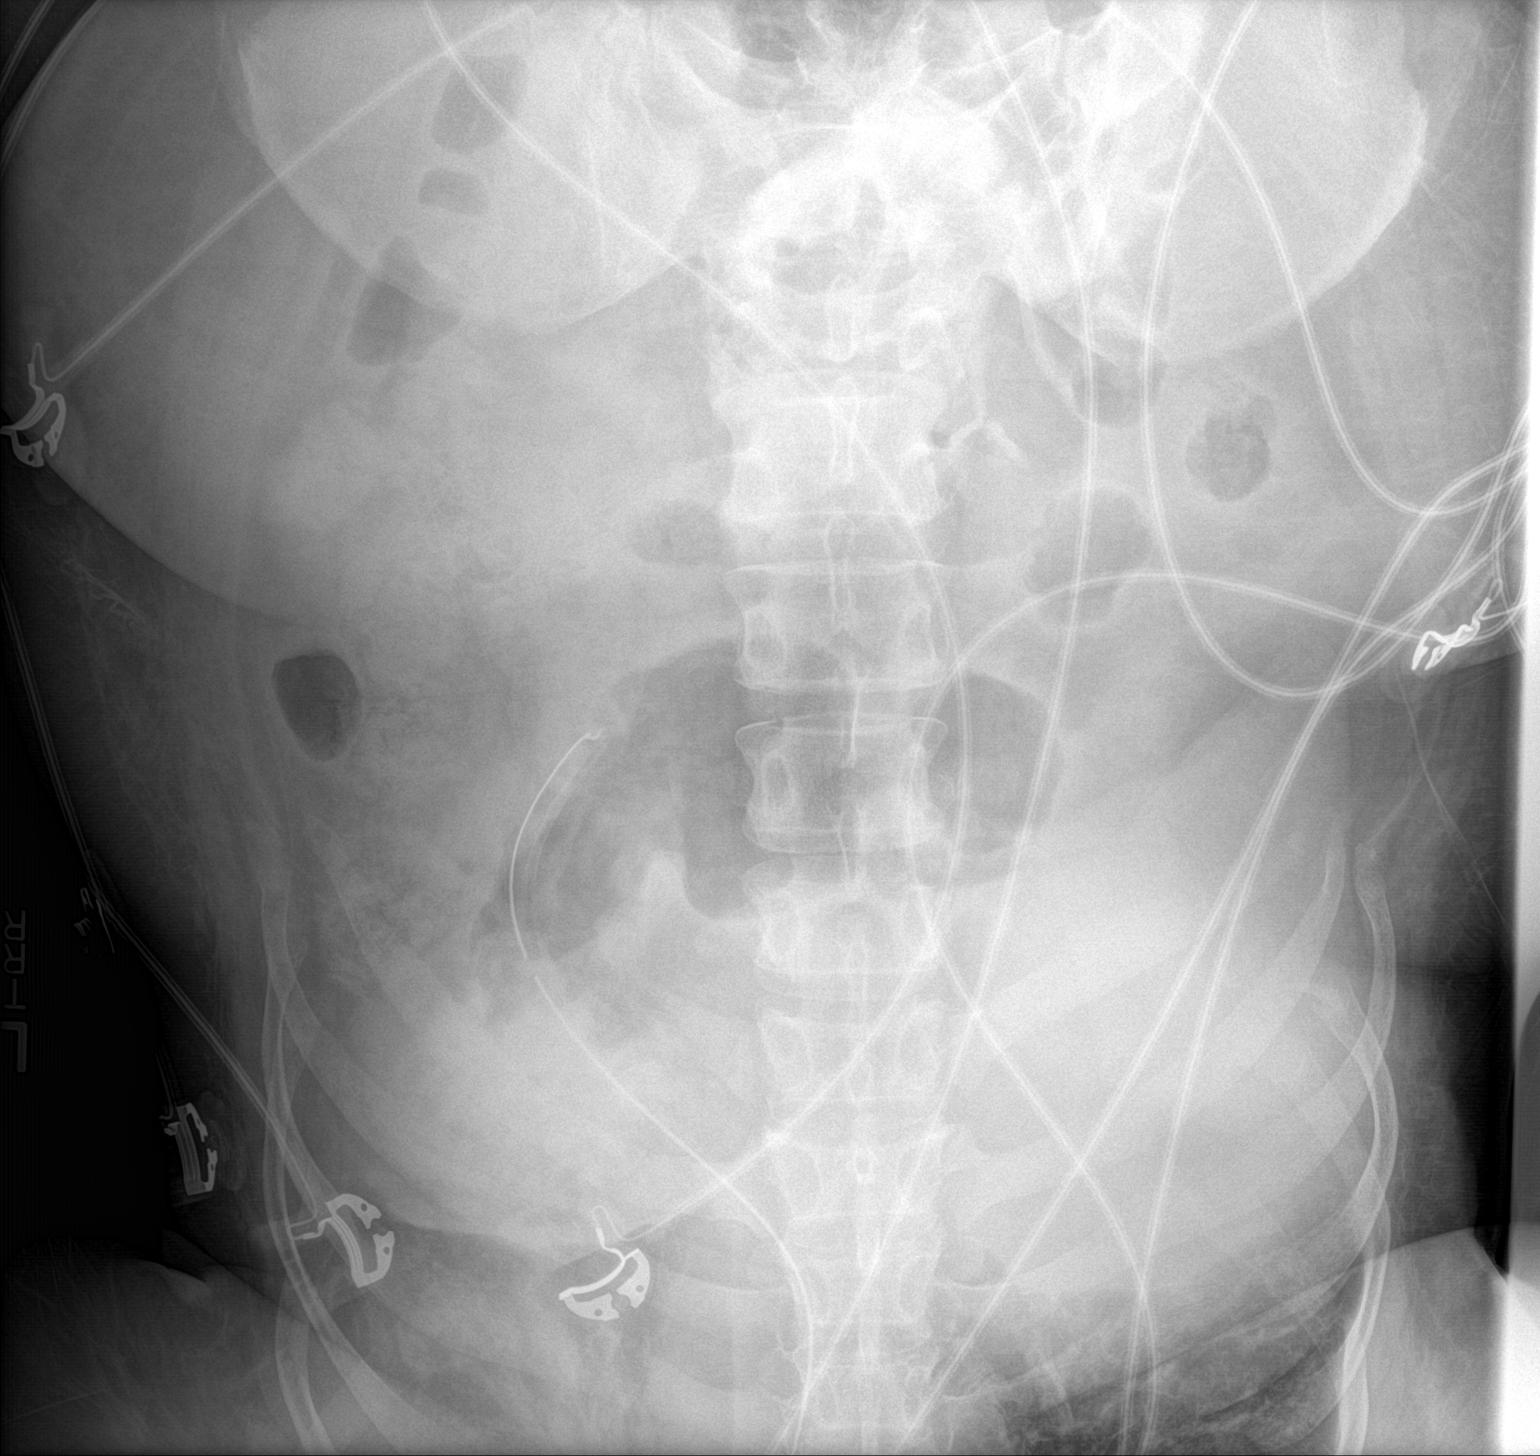

[2 of 2 positions shown; findings below may reference images not displayed]

FINDINGS: NG tube tip is in the mid stomach. Nonobstructive bowel gas pattern.
IMPRESSION: NG tube tip in the mid stomach.

## 2018-03-10 NOTE — Unmapped (Signed)
Current Outpatient Medications on File Prior to Visit   Medication Sig   ??? acetaminophen (TYLENOL) 500 MG tablet TAKE 1 TO 2 TABLETS (500-1000MG ) BY MOUTH THREE TIMES DAILY AS NEEDED (Patient taking differently: TAKE 1 TO 2 TABLETS (500-1000MG ) BY MOUTH THREE TIMES DAILY AS NEEDED FOR PAIN)   ??? acetaminophen (TYLENOL) 500 MG tablet TAKE 1 TO 2 TABLETS (500-1000MG ) BY MOUTH THREE TIMES DAILY AS NEEDED   ??? albuterol HFA 90 mcg/actuation inhaler Inhale 2 puffs daily as needed for shortness of breath.   ??? amLODIPine (NORVASC) 10 MG tablet Take 1 tablet (10 mg total) by mouth daily. Use only as directed.   ??? aspirin (ECOTRIN) 81 MG tablet Take 81 mg by mouth daily.   ??? budesonide-formoterol (SYMBICORT) 80-4.5 mcg/actuation inhaler Inhale 2 puffs Two (2) times a day.   ??? DOK 100 mg capsule TAKE 1 CAPSULE BY MOUTH TWICE DAILY AS NEEDED FOR CONSTIPATION.   ??? gabapentin (NEURONTIN) 300 MG capsule Take 1 capsule (300 mg total) by mouth nightly.   ??? hydrALAZINE (APRESOLINE) 25 MG tablet Take 1 tablet (25 mg total) by mouth Three (3) times a day.   ??? metoprolol tartrate (LOPRESSOR) 25 MG tablet Take 3 tablets (75 mg total) by mouth Two (2) times a day.   ??? MYFORTIC 180 mg EC tablet Take 3 tablets (540 mg total) by mouth Two (2) times a day.   ??? omeprazole (PRILOSEC) 40 MG capsule Take 1 capsule (40 mg total) by mouth daily.   ??? oxyCODONE-acetaminophen (PERCOCET) 10-325 mg per tablet Take 1 tablet by mouth daily as needed for pain. for up to 1 dose May fill today 02/11/18   ??? OXYGEN-AIR DELIVERY SYSTEMS MISC Inhale 3 L continuous.   ??? polyethylene glycol (MIRALAX) 17 gram packet Take 17 g by mouth daily as needed. (Patient taking differently: Take 17 g by mouth daily as needed. Indications: constipation)   ??? PROGRAF 1 mg capsule Take 5 mg every morning and 5 mg every evening, Z94.0, tx date 01/08/18   ??? rosuvastatin (CRESTOR) 5 MG tablet Take 1 tablet (5 mg total) by mouth daily.   ??? sodium bicarbonate 650 mg tablet Take 1 tablet (648 mg total) by mouth Two (2) times a day.   ??? sulfamethoxazole-trimethoprim (BACTRIM,SEPTRA) 400-80 mg per tablet Take 1 tablet (80 mg of trimethoprim total) by mouth 3 (three) times a week.   ??? valGANciclovir (VALCYTE) 450 mg tablet Take 1 tablet (450 mg total) by mouth daily. HOLD     No current facility-administered medications on file prior to visit.

## 2018-03-11 LAB — RENAL FUNCTION PANEL
CALCIUM: 9.9 mg/dL (ref 8.7–10.2)
CHLORIDE: 106 mmol/L (ref 96–106)
CO2: 16 mmol/L — ABNORMAL LOW (ref 20–29)
CREATININE: 1.15 mg/dL — ABNORMAL HIGH (ref 0.57–1.00)
GFR MDRD NON AF AMER: 52 mL/min/{1.73_m2} — ABNORMAL LOW
GLUCOSE: 98 mg/dL (ref 65–99)
PHOSPHORUS, SERUM: 3.6 mg/dL (ref 2.5–4.5)
POTASSIUM: 5.5 mmol/L — ABNORMAL HIGH (ref 3.5–5.2)
SODIUM: 141 mmol/L (ref 134–144)

## 2018-03-11 LAB — MAGNESIUM: Lab: 1.6

## 2018-03-11 LAB — BLOOD UREA NITROGEN: Lab: 11

## 2018-03-12 LAB — TACROLIMUS BLOOD: Lab: 8.5

## 2018-03-12 IMAGING — DX DG CHEST 1V
1 series · 1 of 1 positions shown · non-contrast
Comparison: 01/11/2016

CLINICAL DATA: Dyspnea

EXAM:
CHEST 1 VIEW

[chest ap]
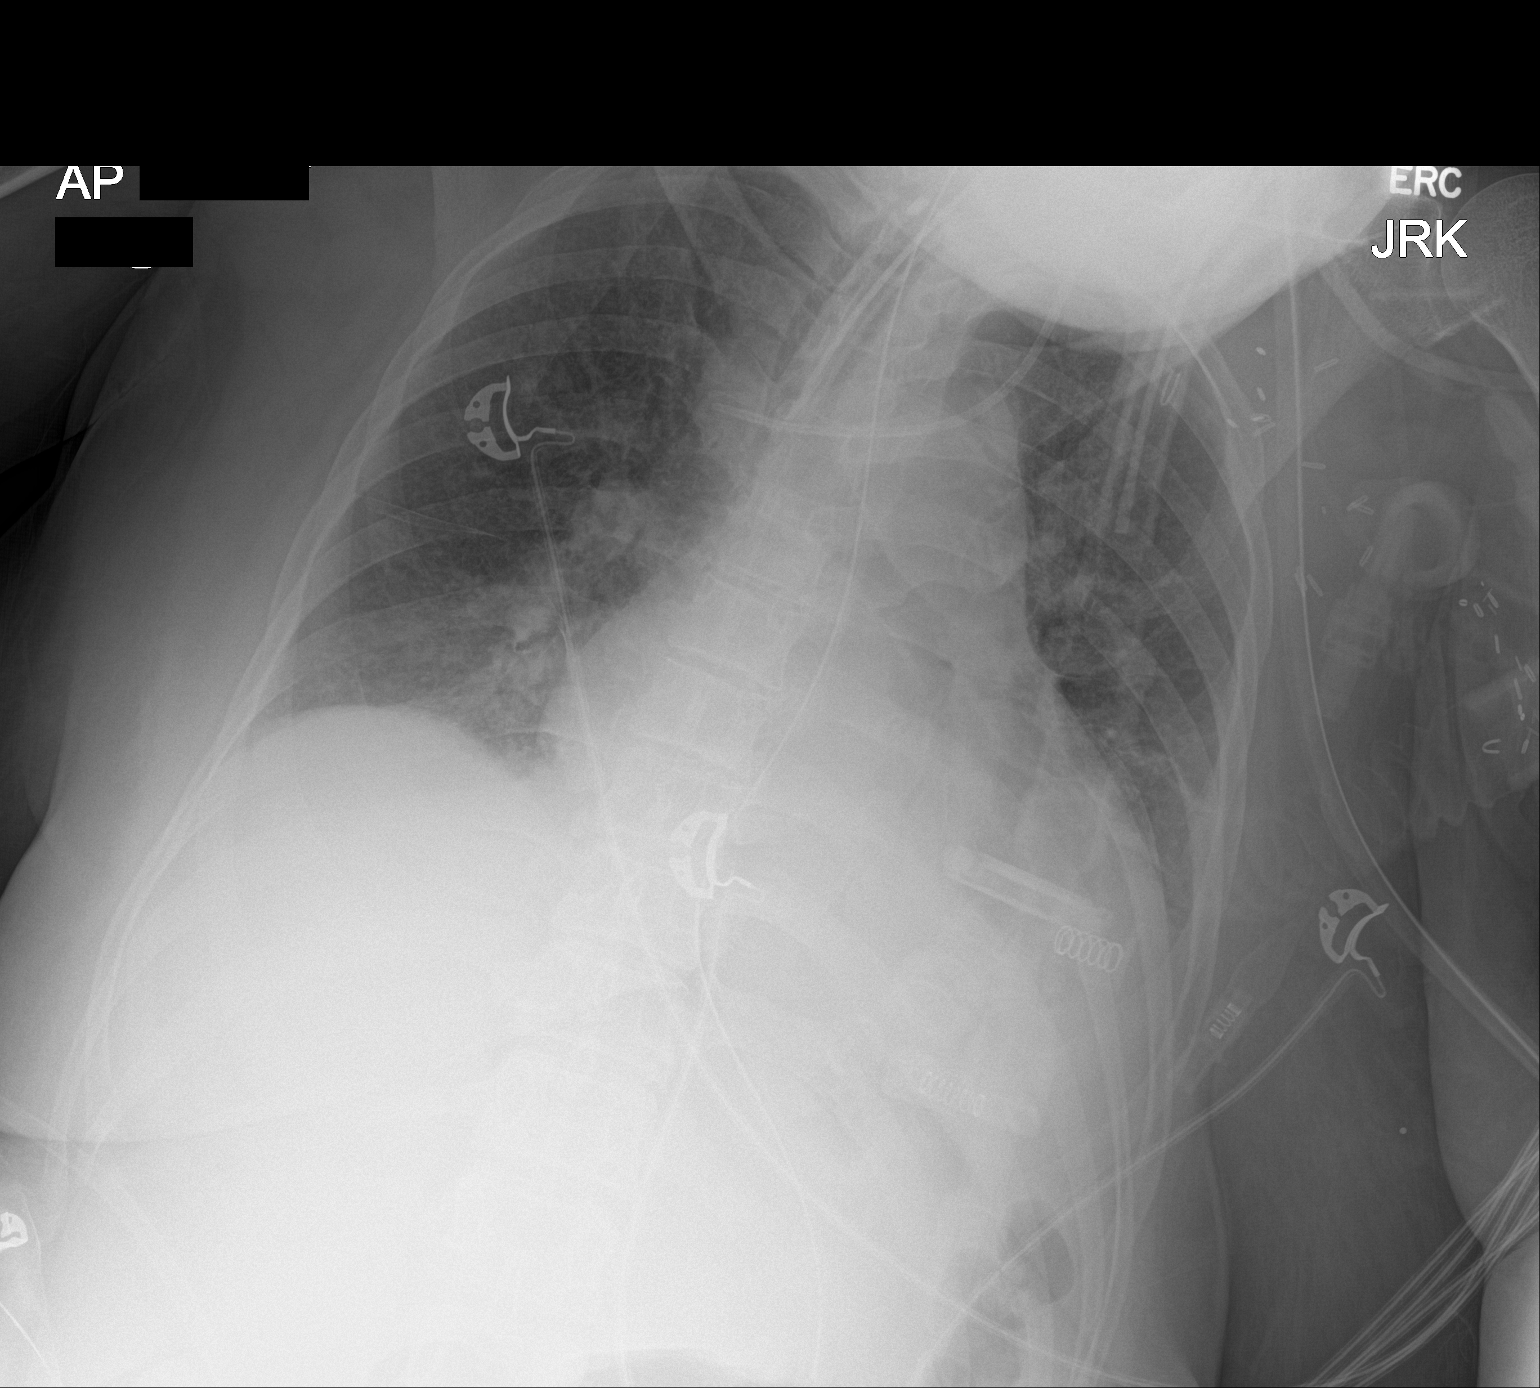

[1 of 1 positions shown; findings below may reference images not displayed]

FINDINGS: ET tube tip is above the carina. There is a left IJ catheter with
tip in the projection of the SVC. Stable cardiac enlargement. Left
pleural effusion and moderate interstitial edema is identified
compatible with CHF.
IMPRESSION: 1. Stable support apparatus.
2. Similar appearance of CHF pattern.

## 2018-03-16 NOTE — Unmapped (Signed)
Epic [facility]) medication reconciliation is completed.

## 2018-03-18 LAB — CBC W/ DIFFERENTIAL
BASOPHILS ABSOLUTE COUNT: 0.1 10*3/uL (ref 0.0–0.2)
BASOPHILS RELATIVE PERCENT: 2 %
EOSINOPHILS RELATIVE PERCENT: 3 %
HEMOGLOBIN: 10.8 g/dL — ABNORMAL LOW (ref 11.1–15.9)
LYMPHOCYTES ABSOLUTE COUNT: 0.2 10*3/uL — ABNORMAL LOW (ref 0.7–3.1)
LYMPHOCYTES RELATIVE PERCENT: 4 %
MEAN CORPUSCULAR HEMOGLOBIN CONC: 32.6 g/dL (ref 31.5–35.7)
MEAN CORPUSCULAR HEMOGLOBIN: 29 pg (ref 26.6–33.0)
MEAN CORPUSCULAR VOLUME: 89 fL (ref 79–97)
MONOCYTES ABSOLUTE COUNT: 0.6 10*3/uL (ref 0.1–0.9)
NEUTROPHILS ABSOLUTE COUNT: 2.9 10*3/uL (ref 1.4–7.0)
NEUTROPHILS RELATIVE PERCENT: 75 %
PLATELET COUNT: 185 10*3/uL (ref 150–450)
RED BLOOD CELL COUNT: 3.72 x10E6/uL — ABNORMAL LOW (ref 3.77–5.28)
RED CELL DISTRIBUTION WIDTH: 17.3 % — ABNORMAL HIGH (ref 12.3–15.4)
WHITE BLOOD CELL COUNT: 3.8 10*3/uL (ref 3.4–10.8)

## 2018-03-18 LAB — RENAL FUNCTION PANEL
ALBUMIN: 4.2 g/dL (ref 3.5–5.5)
BLOOD UREA NITROGEN: 17 mg/dL (ref 6–24)
BUN / CREAT RATIO: 15 (ref 9–23)
CALCIUM: 9.5 mg/dL (ref 8.7–10.2)
CHLORIDE: 105 mmol/L (ref 96–106)
CREATININE: 1.12 mg/dL — ABNORMAL HIGH (ref 0.57–1.00)
GFR MDRD AF AMER: 62 mL/min/{1.73_m2}
GFR MDRD NON AF AMER: 54 mL/min/{1.73_m2} — ABNORMAL LOW
GLUCOSE: 95 mg/dL (ref 65–99)
PHOSPHORUS, SERUM: 3.3 mg/dL (ref 2.5–4.5)
SODIUM: 139 mmol/L (ref 134–144)

## 2018-03-18 LAB — MAGNESIUM: Lab: 1.7

## 2018-03-18 LAB — BANDS: Lab: 1

## 2018-03-18 LAB — MEAN CORPUSCULAR HEMOGLOBIN CONC: Lab: 32.6

## 2018-03-18 LAB — GFR MDRD AF AMER: Lab: 62

## 2018-03-18 MED ORDER — TRAMADOL 50 MG TABLET: 50 mg | tablet | Freq: Four times a day (QID) | 2 refills | 0 days

## 2018-03-18 MED ORDER — OMEPRAZOLE 40 MG CAPSULE,DELAYED RELEASE
ORAL_CAPSULE | Freq: Two times a day (BID) | ORAL | 11 refills | 0 days | Status: CP
Start: 2018-03-18 — End: 2018-03-25

## 2018-03-18 MED ORDER — TRAMADOL 50 MG TABLET
ORAL_TABLET | Freq: Four times a day (QID) | ORAL | 2 refills | 0.00000 days | Status: CP | PRN
Start: 2018-03-18 — End: 2018-03-18
  Filled 2018-04-20: qty 100, 17d supply, fill #0

## 2018-03-18 NOTE — Unmapped (Signed)
Danielle Parker does not need a refill on Myfortic, Prograf, and Valgancyclovir at this time.  I will reschedule a phone call to next week.

## 2018-03-18 NOTE — Unmapped (Signed)
Pt called and stated that she took meds at 2115 and at 2245 she has some acid reflux and she threw up and had diarrhea,  Pt denied seeing pills in emesis,  made pt aware not to retake meds and to stay hydrated and to start with a bland diet in the AM, made pt aware that I would let her primary TNC aware for f/u, pt agreed with plan and stated understanding.

## 2018-03-19 LAB — TACROLIMUS BLOOD: Lab: 7

## 2018-03-24 LAB — CBC W/ DIFFERENTIAL
BANDED NEUTROPHILS ABSOLUTE COUNT: 0.1 10*3/uL (ref 0.0–0.1)
BASOPHILS ABSOLUTE COUNT: 0.1 10*3/uL (ref 0.0–0.2)
BASOPHILS RELATIVE PERCENT: 2 %
EOSINOPHILS ABSOLUTE COUNT: 0.1 10*3/uL (ref 0.0–0.4)
EOSINOPHILS RELATIVE PERCENT: 4 %
HEMOGLOBIN: 11.3 g/dL (ref 11.1–15.9)
IMMATURE GRANULOCYTES: 4 %
LYMPHOCYTES RELATIVE PERCENT: 26 %
MEAN CORPUSCULAR HEMOGLOBIN CONC: 31.7 g/dL (ref 31.5–35.7)
MEAN CORPUSCULAR VOLUME: 92 fL (ref 79–97)
MONOCYTES ABSOLUTE COUNT: 0.2 10*3/uL (ref 0.1–0.9)
MONOCYTES RELATIVE PERCENT: 6 %
NEUTROPHILS ABSOLUTE COUNT: 1.7 10*3/uL (ref 1.4–7.0)
NEUTROPHILS RELATIVE PERCENT: 58 %
PLATELET COUNT: 178 10*3/uL (ref 150–450)
RED BLOOD CELL COUNT: 3.87 x10E6/uL (ref 3.77–5.28)
RED CELL DISTRIBUTION WIDTH: 16.8 % — ABNORMAL HIGH (ref 12.3–15.4)
WHITE BLOOD CELL COUNT: 2.9 10*3/uL — ABNORMAL LOW (ref 3.4–10.8)

## 2018-03-24 LAB — EOSINOPHILS RELATIVE PERCENT: Lab: 4

## 2018-03-24 MED ORDER — PROGRAF 1 MG CAPSULE
ORAL_CAPSULE | 11 refills | 0 days | Status: CP
Start: 2018-03-24 — End: ?
  Filled 2018-03-31: qty 300, 30d supply, fill #0

## 2018-03-24 MED FILL — DOK 100 MG CAPSULE: 30 days supply | Qty: 60 | Fill #0 | Status: AC

## 2018-03-24 MED FILL — AMLODIPINE 10 MG TABLET: 30 days supply | Qty: 30 | Fill #0 | Status: AC

## 2018-03-24 MED FILL — METOPROLOL TARTRATE 25 MG TABLET: 30 days supply | Qty: 180 | Fill #0 | Status: AC

## 2018-03-24 NOTE — Unmapped (Signed)
[facility] Refill Coordination Note    Specialty Medication(s) to be Shipped:   Transplant: Prograf 1mg   (#5 in am and #4 in pm)  Other medication(s) to be shipped:   Colace 100mg                  (To be delivered: 03/25/18)  Amlodipine 10mg   Metoprolol tartrate 25mg      Danielle Parker, DOB: May 22, 1961  Phone: [phone] (home)   Shipping Address: [address]  [place] [address]    All above HIPAA information was verified with patient.     Completed refill call assessment today to schedule patient's medication shipment from the [facility] [phone]).       Specialty medication(s) and dose(s) confirmed: Patient reports changes to the regimen as follows: Stopping the Valcyte 450mg    Changes to medications: Danielle Parker Reports stopping the following medications: sulfa 400/80mg   Changes to insurance: No  Questions for the pharmacist: No    The patient will receive a drug information handout for each medication shipped and additional FDA Medication Guides as required.      DISEASE/MEDICATION-SPECIFIC INFORMATION        N/A    ADHERENCE     Medication Adherence    Patient reported X missed doses in the last month:  0  Specialty Medication:  Prograf 1mg  #oh-13 days (Pt on 5am and 4pm)  Patient is on additional specialty medications:  No  Demonstrates understanding of importance of adherence:  yes  Informant:  patient  Reliability of informant:  reliable  Patient is at risk for Non-Adherence:  No  Confirmed plan for next specialty medication refill:  delivery by pharmacy  Refills needed for supportive medications:  yes, ordered or provider notified          Refill Coordination    Has the Patients' Contact Information Changed:  No  Is the Shipping Address Different:  No         MEDICARE PART B DOCUMENTATION     Prograf 1mg : Patient has 13 days of capsules on hand.    SHIPPING     Shipping address confirmed in Epic.     Delivery Scheduled: Yes, Expected medication delivery date: 04/01/18 via UPS or courier.     Danielle Parker   [facility] Specialty Technician

## 2018-03-24 NOTE — Unmapped (Signed)
No longer prescribing for patient. Received request from [facility] after WAM. Unsure of current dose and forwarding to patient's TNC.

## 2018-03-25 LAB — MAGNESIUM: Lab: 1.8

## 2018-03-25 LAB — RENAL FUNCTION PANEL
ALBUMIN: 4.1 g/dL (ref 3.5–5.5)
BLOOD UREA NITROGEN: 20 mg/dL (ref 6–24)
BUN / CREAT RATIO: 15 (ref 9–23)
CALCIUM: 9.8 mg/dL (ref 8.7–10.2)
CHLORIDE: 106 mmol/L (ref 96–106)
CO2: 19 mmol/L — ABNORMAL LOW (ref 20–29)
CREATININE: 1.34 mg/dL — ABNORMAL HIGH (ref 0.57–1.00)
GFR MDRD AF AMER: 50 mL/min/{1.73_m2} — ABNORMAL LOW
GLUCOSE: 100 mg/dL — ABNORMAL HIGH (ref 65–99)
POTASSIUM: 5.2 mmol/L (ref 3.5–5.2)
SODIUM: 139 mmol/L (ref 134–144)

## 2018-03-25 LAB — SODIUM: Lab: 139

## 2018-03-25 NOTE — Unmapped (Signed)
Patient called the on call nurse and wanted to know if the kidney was in the crease by her leg.  She stated she felt a bump, bump/throbbing down there.  After many questions, it sounds like she is describing her femoral pulses.  I explained that the kidney is high up, right inside of her hip bone, under her incision.  She stated under my belly.  She stated that she gets little twinges down there at the location of what she is describing as her femoral pulse area.  She stated she takes tramadol when the pain gets bad.  She stated that this just started happening but she did state that she has been packing and walking more due to preparing to move.  She was also concerned that the kidney was just under the skin.  I reassured her that the kidney was not just under the skin.    I told her to let her primary coordinator know if the pain gets worse or she has more questions.  She voiced understanding and agreement.

## 2018-03-26 LAB — TACROLIMUS BLOOD: Lab: 7.2

## 2018-03-28 MED ORDER — SULFAMETHOXAZOLE 400 MG-TRIMETHOPRIM 80 MG TABLET
ORAL_TABLET | ORAL | 3 refills | 0 days | Status: CP
Start: 2018-03-28 — End: 2018-06-21
  Filled 2018-03-31: qty 12, 28d supply, fill #0

## 2018-03-30 ENCOUNTER — Encounter: Payer: Self-pay | Admitting: Family

## 2018-03-30 ENCOUNTER — Ambulatory Visit: Payer: Medicare Other | Attending: Family | Admitting: Family

## 2018-03-30 VITALS — BP 120/82 | HR 57 | Resp 18 | Ht 63.0 in | Wt 174.5 lb

## 2018-03-30 DIAGNOSIS — I10 Essential (primary) hypertension: Secondary | ICD-10-CM

## 2018-03-30 DIAGNOSIS — Z940 Kidney transplant status: Secondary | ICD-10-CM | POA: Insufficient documentation

## 2018-03-30 DIAGNOSIS — Z9889 Other specified postprocedural states: Secondary | ICD-10-CM | POA: Diagnosis not present

## 2018-03-30 DIAGNOSIS — Z888 Allergy status to other drugs, medicaments and biological substances status: Secondary | ICD-10-CM | POA: Diagnosis not present

## 2018-03-30 DIAGNOSIS — Z8249 Family history of ischemic heart disease and other diseases of the circulatory system: Secondary | ICD-10-CM | POA: Insufficient documentation

## 2018-03-30 DIAGNOSIS — I5032 Chronic diastolic (congestive) heart failure: Secondary | ICD-10-CM | POA: Insufficient documentation

## 2018-03-30 DIAGNOSIS — I890 Lymphedema, not elsewhere classified: Secondary | ICD-10-CM | POA: Insufficient documentation

## 2018-03-30 DIAGNOSIS — Z91018 Allergy to other foods: Secondary | ICD-10-CM | POA: Insufficient documentation

## 2018-03-30 DIAGNOSIS — Z87891 Personal history of nicotine dependence: Secondary | ICD-10-CM | POA: Diagnosis not present

## 2018-03-30 DIAGNOSIS — N186 End stage renal disease: Secondary | ICD-10-CM | POA: Diagnosis not present

## 2018-03-30 DIAGNOSIS — Z885 Allergy status to narcotic agent status: Secondary | ICD-10-CM | POA: Diagnosis not present

## 2018-03-30 DIAGNOSIS — Z7982 Long term (current) use of aspirin: Secondary | ICD-10-CM | POA: Insufficient documentation

## 2018-03-30 DIAGNOSIS — Z79891 Long term (current) use of opiate analgesic: Secondary | ICD-10-CM | POA: Diagnosis not present

## 2018-03-30 DIAGNOSIS — Z79899 Other long term (current) drug therapy: Secondary | ICD-10-CM | POA: Insufficient documentation

## 2018-03-30 DIAGNOSIS — I132 Hypertensive heart and chronic kidney disease with heart failure and with stage 5 chronic kidney disease, or end stage renal disease: Secondary | ICD-10-CM | POA: Diagnosis not present

## 2018-03-30 DIAGNOSIS — Z09 Encounter for follow-up examination after completed treatment for conditions other than malignant neoplasm: Secondary | ICD-10-CM | POA: Insufficient documentation

## 2018-03-30 DIAGNOSIS — E1122 Type 2 diabetes mellitus with diabetic chronic kidney disease: Secondary | ICD-10-CM | POA: Diagnosis not present

## 2018-03-30 DIAGNOSIS — E1140 Type 2 diabetes mellitus with diabetic neuropathy, unspecified: Secondary | ICD-10-CM | POA: Diagnosis not present

## 2018-03-30 NOTE — Patient Instructions (Signed)
Continue weighing daily and call for an overnight weight gain of > 2 pounds or a weekly weight gain of >5 pounds. 

## 2018-03-30 NOTE — Progress Notes (Signed)
Subjective:    Patient ID: Jasmine Holloway, female    DOB: 1961-02-14, 57 y.o.   MRN: [id number]  HPI   Ms Jasmine Holloway is a 57 y/o female with a history of asthma, DM, HTN, kidney disease (dialysis), COPD, previous tobacco use and chronic heart failure.   Echo report from 09/21/17 reviewed and showed an EF of 60-65%. Reviewed echo report on 11/10/16 which showed an EF of 50-55% along with mild AR/MR. EF was 55-60% May 2017. Cardiac catheterization was done 03/17/16 and showed normal coronary arteries.   Was in the ED 01/19/18 due to abdominal pain. This improved after GI cocktail and she was released. Was in the ED 01/18/18 due to abdominal pain where she was treated and released. Was in the ED 01/17/18 due to abdominal pain. Ultrasound was negative and she was released with antibiotics. Admitted 01/07/18 due to kidney transplant where she was discharged after 6 day. Admitted 11/08/17 due to acute on chronic hypoxic respiratory failure due to fluid overload. Initially needed bipap and then transitioned to her baseline nasal cannula. Case discussed with nephrology who recommended extended dialysis time. Discharged after 2 days. Was in the ED 10/16/17 due to chest wall pain where she was evaluated and released.   She presents today for a follow-up visit with a chief complaint of intermittent tingling in her left arm due to where her fistula is. She has no associated symptoms and currently denies difficulty sleeping, abdominal distention, palpitations, pedal edema, chest pain, shortness of breath, cough, fatigue, light-headedness or weight gain. She's had her kidney transplant since she was last here and reports feeling "great".   Past Medical History:  Diagnosis Date  . Asthma   . CHF (congestive heart failure) ([place])   . COPD (chronic obstructive pulmonary disease) ([place])   . Diabetes mellitus without complication ([place])   . Diastolic heart failure ([place])   . ESRD (end stage renal disease) on dialysis ([place])   . HIV  (human immunodeficiency virus infection) ([place])   . Hypertension   . Neuropathy   . Polycystic kidney disease   . Renal insufficiency     Past Surgical History:  Procedure Laterality Date  . ABDOMINAL HYSTERECTOMY    . ABDOMINAL SURGERY    . ARTERIOVENOUS GRAFT PLACEMENT Right    x3 (R forearm currently used for access)  . BREAST BIOPSY Left 12/03/2015   neg  . COLON SURGERY    . ESOPHAGOGASTRODUODENOSCOPY (EGD) WITH PROPOFOL N/A 02/04/2017   Procedure: ESOPHAGOGASTRODUODENOSCOPY (EGD) WITH PROPOFOL;  Surgeon: Jonathon Bellows, MD;  Location: [facility] ENDOSCOPY;  Service: Endoscopy;  Laterality: N/A;  . PERIPHERAL VASCULAR CATHETERIZATION Right 08/04/2016   Procedure: A/V Shuntogram;  Surgeon: Algernon Huxley, MD;  Location: [place] CV LAB;  Service: Cardiovascular;  Laterality: Right;  . VEIN HARVEST      Family History  Problem Relation Age of Onset  . Hypertension Brother   . Heart failure Brother   . Hypertension Mother   . Heart disease Mother     Social History   Tobacco Use  . Smoking status: Former Smoker    Packs/day: 0.25    Last attempt to quit: 08/11/2015    Years since quitting: 2.6  . Smokeless tobacco: Never Used  Substance Use Topics  . Alcohol use: No    Alcohol/week: 0.0 standard drinks    Allergies  Allergen Reactions  . Morphine And Related Nausea And Vomiting  . Strawberry Extract   . Tramadol Nausea And Vomiting  .  Venofer [Iron Sucrose]   . Buprenorphine Hcl Nausea And Vomiting  . Morphine Nausea And Vomiting    Halllucinations   Prior to Admission medications   Medication Sig Start Date End Date Taking? Authorizing Provider  acetaminophen (TYLENOL) 325 MG tablet Take 2 tablets (650 mg total) by mouth every 6 (six) hours as needed for mild pain or headache. 03/18/16  Yes Gouru, Aruna, MD  amLODipine (NORVASC) 5 MG tablet Take 5 mg daily by mouth.  08/31/14  Yes [provider]  aspirin 81 MG chewable tablet Chew 1 tablet (81 mg total) by  mouth daily. 01/17/16  Yes Gladstone Lighter, MD  budesonide-formoterol (SYMBICORT) 160-4.5 MCG/ACT inhaler Inhale 2 puffs into the lungs 2 (two) times daily.   Yes [provider]  docusate sodium (COLACE) 100 MG capsule Take 1 capsule (100 mg total) by mouth daily as needed. Patient taking differently: Take 100 mg by mouth 2 (two) times daily.  01/17/18 01/17/19 Yes McShane, Gerda Diss, MD  gabapentin (NEURONTIN) 300 MG capsule Take 300 mg by mouth at bedtime.    Yes [provider]  metoprolol succinate (TOPROL-XL) 50 MG 24 hr tablet Take 50 mg daily by mouth.    Yes [provider]  mycophenolate (MYFORTIC) 180 MG EC tablet Take 540 mg by mouth 2 (two) times daily.   Yes [provider]  omeprazole (PRILOSEC) 20 MG capsule Take 1 capsule by mouth daily. 08/06/17  Yes [provider]  sulfamethoxazole-trimethoprim (BACTRIM,SEPTRA) 400-80 MG tablet Take 1 tablet by mouth 3 (three) times a week.   Yes [provider]  tacrolimus (PROGRAF) 1 MG capsule Take 5 mg by mouth 2 (two) times daily.   Yes [provider]  traMADol (ULTRAM) 50 MG tablet Take 50 mg by mouth every 6 (six) hours as needed.   Yes [provider]  valGANciclovir (VALCYTE) 450 MG tablet Take 450 mg by mouth daily.   Yes [provider]  VENTOLIN HFA 108 (90 Base) MCG/ACT inhaler Inhale 2 puffs into the lungs daily as needed.  05/21/16  Yes [provider]    Review of Systems  Constitutional: Negative for appetite change and fatigue.  HENT: Negative for congestion, nosebleeds, sinus pressure and sore throat.   Eyes: Negative for pain and visual disturbance.  Respiratory: Negative for cough, chest tightness and shortness of breath.   Cardiovascular: Negative for chest pain, palpitations and leg swelling.  Gastrointestinal: Negative for abdominal distention and abdominal pain.  Endocrine: Negative.   Genitourinary: Negative.   Musculoskeletal:  Negative for back pain and neck pain.  Skin: Negative.   Allergic/Immunologic: Negative.   Neurological: Positive for numbness (tingling down left arm at times due to previous graft). Negative for dizziness, light-headedness and headaches.  Hematological: Negative for adenopathy. Does not bruise/bleed easily.  Psychiatric/Behavioral: Negative for dysphoric mood and sleep disturbance (sleeping on 2 pillows). The patient is not nervous/anxious.    Vitals:   03/30/18 0955  BP: 120/82  Pulse: (!) 57  Resp: 18  SpO2: 100%  Weight: 174 lb 8 oz (79.2 kg)  Height: 5\' 3"  (1.6 m)   Wt Readings from Last 3 Encounters:  03/30/18 174 lb 8 oz (79.2 kg)  01/18/18 171 lb (77.6 kg)  12/22/17 179 lb 4 oz (81.3 kg)   Lab Results  Component Value Date   CREATININE 1.63 (H) 01/18/2018   CREATININE 1.79 (H) 01/17/2018   CREATININE 14.43 (H) 11/09/2017     Objective:   Physical Exam  Constitutional: She is oriented to person, place, and time. She appears well-developed and well-nourished.  HENT:  Head: Normocephalic and atraumatic.  Neck: Normal range of motion. Neck supple.  Cardiovascular: Normal rate and regular rhythm.  Pulmonary/Chest: Effort normal. She has no wheezes. She has no rales.  Abdominal: Soft. She exhibits no distension. There is no tenderness.  Musculoskeletal: She exhibits no edema or tenderness.  Neurological: She is alert and oriented to person, place, and time.  Skin: Skin is warm and dry.  Psychiatric: She has a normal mood and affect. Her behavior is normal. Thought content normal.  Nursing note and vitals reviewed.     Assessment & Plan:  1: Chronic heart failure with preserved ejection fraction-   - NYHA class I - euvolemic today  - weighing daily; Reminded to call for an overnight weight gain of >2 pounds or a weekly weight gain of >5 pounds - weight down 5 pounds since she was last here 3 months ago - no longer on fluid restriction - wearing oxygent at 2L at  bedtime - saw cardiologist Rockey Situ) 12/08/17 - not adding salt and is trying to eat low sodium foods. Reminded about the importance of following a 2000mg  sodium diet. She says that she's being diligent about reading food labels  - BNP on 11/08/17 was 599.0 - PharmD reconciled medications with the patient  2: HTN-  - BP looks good today - BMP on 01/18/18 reviewed and showed sodium 137, potassium 5.3 and GFR 39  3. Kidney transplant- - received renal transplant 01/08/18 - saw nephrologist [facility]) 02/23/18  4: Lymphedema- - stage 2 - no edema at this time  Patient did not bring her medications nor a list. Each medication was verbally reviewed with the patient and she was encouraged to bring the bottles to every visit to confirm accuracy of list.  Return in 3 months or sooner for any questions/problems before then.

## 2018-03-31 LAB — CBC W/ DIFFERENTIAL
BANDED NEUTROPHILS ABSOLUTE COUNT: 0.2 10*3/uL — ABNORMAL HIGH (ref 0.0–0.1)
BASOPHILS ABSOLUTE COUNT: 0.1 10*3/uL (ref 0.0–0.2)
BASOPHILS RELATIVE PERCENT: 2 %
EOSINOPHILS ABSOLUTE COUNT: 0.1 10*3/uL (ref 0.0–0.4)
EOSINOPHILS RELATIVE PERCENT: 4 %
HEMATOCRIT: 34.5 % (ref 34.0–46.6)
HEMOGLOBIN: 11.3 g/dL (ref 11.1–15.9)
IMMATURE GRANULOCYTES: 5 %
LYMPHOCYTES ABSOLUTE COUNT: 0.3 10*3/uL — ABNORMAL LOW (ref 0.7–3.1)
LYMPHOCYTES RELATIVE PERCENT: 7 %
MEAN CORPUSCULAR HEMOGLOBIN CONC: 32.8 g/dL (ref 31.5–35.7)
MEAN CORPUSCULAR HEMOGLOBIN: 29.2 pg (ref 26.6–33.0)
MEAN CORPUSCULAR VOLUME: 89 fL (ref 79–97)
MONOCYTES ABSOLUTE COUNT: 0.5 10*3/uL (ref 0.1–0.9)
MONOCYTES RELATIVE PERCENT: 13 %
NEUTROPHILS ABSOLUTE COUNT: 2.5 10*3/uL (ref 1.4–7.0)
NEUTROPHILS RELATIVE PERCENT: 69 %
RED CELL DISTRIBUTION WIDTH: 16.8 % — ABNORMAL HIGH (ref 12.3–15.4)
WHITE BLOOD CELL COUNT: 3.7 10*3/uL (ref 3.4–10.8)

## 2018-03-31 LAB — EOSINOPHILS ABSOLUTE COUNT: Lab: 0.1

## 2018-03-31 MED FILL — SULFAMETHOXAZOLE 400 MG-TRIMETHOPRIM 80 MG TABLET: 28 days supply | Qty: 12 | Fill #0 | Status: AC

## 2018-03-31 MED FILL — PROGRAF 1 MG CAPSULE: 30 days supply | Qty: 300 | Fill #0 | Status: AC

## 2018-04-01 LAB — RENAL FUNCTION PANEL
ALBUMIN: 4 g/dL (ref 3.5–5.5)
BUN / CREAT RATIO: 11 (ref 9–23)
CALCIUM: 9.9 mg/dL (ref 8.7–10.2)
CHLORIDE: 106 mmol/L (ref 96–106)
CO2: 20 mmol/L (ref 20–29)
CREATININE: 1.24 mg/dL — ABNORMAL HIGH (ref 0.57–1.00)
GFR MDRD AF AMER: 55 mL/min/{1.73_m2} — ABNORMAL LOW
GFR MDRD NON AF AMER: 48 mL/min/{1.73_m2} — ABNORMAL LOW
PHOSPHORUS, SERUM: 3.1 mg/dL (ref 2.5–4.5)
POTASSIUM: 4.6 mmol/L (ref 3.5–5.2)
SODIUM: 140 mmol/L (ref 134–144)

## 2018-04-01 LAB — MAGNESIUM: Lab: 1.6

## 2018-04-01 LAB — BUN / CREAT RATIO: Lab: 11

## 2018-04-02 LAB — TACROLIMUS BLOOD: Lab: 8

## 2018-04-06 NOTE — Unmapped (Deleted)
{** REMINDER - THIS NOTE IS NOT A FINAL MEDICAL RECORD UNTIL IT IS SIGNED.  UNTIL THEN, THE CONTENT BELOW MAY REFLECT INFORMATION FROM A DOCUMENTATION TEMPLATE, NOT THE ACTUAL PATIENT VISIT. **}    [facility]   ACUTE/CHRONIC TRANSPLANT FOLLOW UP     PCP: Marton Redwood, MD     Date of Visit at Transplant clinic: 04/06/2018     Graft Status: stable.    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   Urine analysis: negative protein/ <1 RBC/ <1 WBC.  Urine protein/creatinine ratio: 0.144  DSA: not done yet.    Last CMV checked: today, pending.  Last BKV checked: today, pending.    CMV: D-/R+  Prophylaxis-  Valcyte x 3 months (end 04/10/18) and Bactrim SS MWF x 6 months (end 07/10/18)    2) Immunosuppression: Prograf 5/4 mg BID / Myfortic 540 BID.  - Prograf level 6.2 and Last dose of Prograf: 9  - Goal of 12 hour Prograf trough: 8-10  - Changes in Immunosuppression - Increase prograf to 5 mg BID.  - Medications side effects: No     3) HTN - Controlled  Goal for B.P - <130/80   Changes in B.P medications - discontinue hydralazine today      4) Anemia - stable   Goal for Hemoglobin > 13.0  Last Ferritin or Tsat if anemic.     5) MBD - Calcium/Phosphorus - WNL.  iPTH - not yet checked   Last Dexa scan - not checked     6) Electrolytes:  - WNL    7) Immunizations:   Influenza (inactivated only): 08/17/2017   Pneumococcal vaccination (inactivated only): will ask patient.    8) Cancer screening:  PAP smear - up to date   Mammogram - up to date   Colonoscopy - 2017 with 10 year follow up recommended     9) COPD - encourage using albuterol and Symbicort as prescribed.    Follow up: 5 weeks.      History of Presenting Illness:     Since patient's last visit in the transplant clinic - patient has been doing well in terms of transplant, taking transplant medications regularly, no episodes of rejection and no side effects of medications.    Patient presents today for follow up visit status post transplant 01/08/2018. She has been doing well post transplant. 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 She was on dialysis for 9 years prior to transplant. Denies family history of stroke or SAH. No history of liver disease.     Patient also has history of COPD. She quit smoking in 2017, previously smoked approximately 1/2 pack per day for greater than 30 years. She is using symbicort and albuterol every 3-4 days and reports symptoms are typically well managed. No wheezing. She has required hospitalization and intubation for COPD exacerbation in the past, last 2017. Reports occasional chest tightness with walking, attributes to head and poor compliance with inhaler.     She has history of hypertension almost all her life. She had prior 2 week event monitor May 2019 for episodes of palpitations. Results showed normal sinus rhythm with average HR of 69.    She is followed by [facility] pain clinic for chronic pain to left arm, specifically around the arm. 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 She continued to experience significant pain/cramping during dialysis. Pain was managed with percocet and gabapentin. She was last seen by pain clinic on 7/5 and percocet was refilled. However, per note this was patient's last refill as they did not feel she required pain meds now she is off dialysis.     Stables removed today      Last dose of Prograf at: 9 PM   Diabetes: No:   HTN: Yes    Controlled:Yes    CAD/Heartfailure: Yes - catheterization 03/17/2016 showing on coronary disease     Last Echo/Ejection fraction: Echo 09/21/17 / EF 60-65%     Cardiologist: Dr. Julien Nordmann   Adherence      With Medication: yes     With Follow up: yes      Functional Status: Independent.    Review of Systems:     Fever or chills: negative   Sore throat: negative   Fatigue/malaise: negative   Weight loss or gain: negative   New skin rash/lump or bump: negative   Problems with teeth/gums: negative   Chest pain: negative   Cough or shortness of breath: negative   Swelling: negative   Belly pain/heartburn/nausea/vomiting or diarrhea: negative   Pain or bleeding when urinating: negative   Twitching/numbness or weakness: negative     Physical Exam:     There were no vitals taken for this visit.    Nursing note and vitals reviewed.   Constitutional: Oriented to person, place, and time. Appears well-developed and well-nourished. No distress.   HENT:   Head: Normocephalic and atraumatic.   Eyes: Right eye exhibits no discharge. Left eye exhibits no discharge. No scleral icterus.   Neck: Normal range of motion. Neck supple.   Cardiovascular: Normal rate and regular rhythm. Exam reveals no gallop and no friction rub. No murmur heard.   Pulmonary/Chest: Effort normal and breath sounds normal. No respiratory distress.   Abdominal: well healed ventral incision. Soft. Staples removed.   Musculoskeletal: Normal range of motion. No edema and no tenderness.   Neurological: Alert and oriented to person, place, and time.   Skin: Skin is warm and dry. No rash noted. Not diaphoretic. No erythema. No pallor.   Psychiatric: Normal mood and affect. Behavior is normal. Judgment and thought content normal.     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   Induction: [place]    Maintenance IS at the time of transplant: methylprednisolone taper which ended on 01/11/18, Cellcept 750mg  BID, and Prograf titrated up to 8mg  BID by day of discharge.   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 Patient treated on 7 day course of PO ciprofoxacin, completed on 01/16/18.     Allergies:   Allergies   Allergen Reactions   ??? Buprenorphine Hcl Nausea And Vomiting   ??? Codeine Nausea And Vomiting   ??? Hydrocodone-Acetaminophen Nausea And Vomiting   ???  Morphine Nausea And Vomiting     Halllucinations        Current Medications:   Current Outpatient Medications   Medication Sig Dispense Refill   ??? acetaminophen (TYLENOL) 500 MG tablet TAKE 1 TO 2 TABLETS (500-1000MG ) BY MOUTH THREE TIMES DAILY AS NEEDED 100 each 99   ??? amLODIPine (NORVASC) 10 MG tablet TAKE ONE TABLET DAILY **USE ONLY AS DIRECTED** 30 tablet 10   ??? aspirin (ECOTRIN) 81 MG tablet Take 81 mg by mouth daily.     ??? budesonide-formoterol (SYMBICORT) 80-4.5 mcg/actuation inhaler INHALE 2 PUFFS TWICE DAILY 10.2 g 11   ??? docusate sodium (COLACE) 100 MG capsule TAKE 1 CAPSULE BY MOUTH TWICE DAILY AS NEEDED FOR CONSTIPATION. 60 capsule 99   ??? gabapentin (NEURONTIN) 300 MG capsule TAKE 1 CAPSULE BY MOUTH NIGHTLY 30 capsule 0   ??? metoprolol tartrate (LOPRESSOR) 25 MG tablet Take 3 tablets (75 mg total) by mouth Two (2) times a day. 180 tablet 11   ??? mycophenolate (MYFORTIC) 180 MG EC tablet TAKE 3 TABLETS (540MG ) BY MOUTH TWICE DAILY 180 tablet 10   ??? omeprazole (PRILOSEC) 40 MG capsule Take 1 capsule (40 mg total) by mouth two (2) times a day. 60 capsule 11   ??? OXYGEN-AIR DELIVERY SYSTEMS MISC Inhale 3 L continuous.     ??? polyethylene glycol (MIRALAX) 17 gram packet MIX 1 PACKET IN 4-8 OUNCES OF WATER,JUICE,SODA,COFFEE,OR TEA AND DRINK DAILY AS NEEDED (Patient taking differently: Take 17 g by mouth daily as needed. Indications: constipation) 30 each 0   ??? PROGRAF 1 mg capsule Take 5 capsules (5mg ) by mouth every morning and 5 capsules (5mg ) every evening 300 capsule 11   ??? rosuvastatin (CRESTOR) 5 MG tablet TAKE 1 TABLET BY MOUTH ONCE DAILY 90 tablet 3   ??? sodium bicarbonate 650 mg tablet Take 1 tablet (648 mg total) by mouth Two (2) times a day. 60 tablet 11   ??? sulfamethoxazole-trimethoprim (BACTRIM,SEPTRA) 400-80 mg per tablet Take 1 tablet (80 mg of trimethoprim total) by mouth 3 (three) times a week. 12 tablet 3   ??? traMADol (ULTRAM) 50 mg tablet Take 1 tablet (50 mg total) by mouth every six (6) hours as needed for pain. Chronic arm pain from previous surgery, 30 days supply 60 tablet 2   ??? valGANciclovir (VALCYTE) 450 mg tablet Take 1 tablet (450 mg total) by mouth daily. HOLD 30 tablet 1   ??? VENTOLIN HFA 90 mcg/actuation inhaler INHALE 2 PUFFS ONCE DAILY AS NEEDED FOR SHORTNESS OF BREATH 18 g 1     No current facility-administered medications for this visit.        Past Medical History:   Past Medical History:   Diagnosis Date   ??? Abnormal mammogram    ??? Asthma    ??? Chronic kidney disease    ??? COPD (chronic obstructive pulmonary disease) (CMS-HCC)    ??? GERD (gastroesophageal reflux disease)    ??? Hypertension    ??? Migraine    ??? Nephrotic syndrome         Laboratory studies:     Recent Results (from the past 170 hour(s))   CBC w/ Differential    Collection Time: 03/31/18  8:19 AM   Result Value Ref Range    WBC 3.7 3.4 - 10.8 x10E3/uL    RBC 3.87 3.77 - 5.28 x10E6/uL    HGB 11.3 11.1 - 15.9 g/dL    HCT 16.1 09.6 - 04.5 %    MCV 89  79 - 97 fL    MCH 29.2 26.6 - 33.0 pg    MCHC 32.8 31.5 - 35.7 g/dL    RDW 16.1 (H) 09.6 - 15.4 %    Platelet 184 150 - 450 x10E3/uL    Neutrophils % 69 Not Estab. %    Lymphocytes % 7 Not Estab. %    Monocytes % 13 Not Estab. %    Eosinophils % 4 Not Estab. %    Basophils % 2 Not Estab. %    Absolute Neutrophils 2.5 1.4 - 7.0 x10E3/uL    Absolute Lymphocytes 0.3 (L) 0.7 - 3.1 x10E3/uL    Absolute Monocytes  0.5 0.1 - 0.9 x10E3/uL    Absolute Eosinophils 0.1 0.0 - 0.4 x10E3/uL    Absolute Basophils  0.1 0.0 - 0.2 x10E3/uL    Immature Granulocytes 5 Not Estab. %    Bands Absolute 0.2 (H) 0.0 - 0.1 x10E3/uL   Magnesium Level    Collection Time: 03/31/18  8:19 AM   Result Value Ref Range    Magnesium 1.6 1.6 - 2.3 mg/dL   Tacrolimus level    Collection Time: 03/31/18  8:19 AM   Result Value Ref Range    Tacrolimus Lvl 8.0 2.0 - 20.0 ng/mL   Renal Function Panel    Collection Time: 03/31/18  8:19 AM   Result Value Ref Range    Glucose 93 65 - 99 mg/dL    BUN 14 6 - 24 mg/dL    Creatinine 0.45 (H) 0.57 - 1.00 mg/dL    GFR MDRD Non Af Amer 48 (L) >59 mL/min/1.73    GFR MDRD Af Amer 55 (L) >59 mL/min/1.73    BUN/Creatinine Ratio 11 9 - 23    Sodium 140 134 - 144 mmol/L    Potassium 4.6 3.5 - 5.2 mmol/L    Chloride 106 96 - 106 mmol/L    CO2 20 20 - 29 mmol/L    Calcium 9.9 8.7 - 10.2 mg/dL    Phosphorus, Serum 3.1 2.5 - 4.5 mg/dL    Albumin 4.0 3.5 - 5.5 g/dL     Scribe's Attestation: Leeroy Bock, MD obtained and performed the history, physical exam and medical decision making elements that were entered into the chart.  Signed by Delaney Meigs, Scribe, on *** at ***.    {*** NOTE TO PROVIDER: PLEASE ADD ATTESTATION NOTING YOU AGREE WITH SCRIBE DOCUMENTATION}

## 2018-04-07 LAB — CBC W/ DIFFERENTIAL
BANDED NEUTROPHILS ABSOLUTE COUNT: 0 10*3/uL (ref 0.0–0.1)
BASOPHILS ABSOLUTE COUNT: 0.1 10*3/uL (ref 0.0–0.2)
EOSINOPHILS ABSOLUTE COUNT: 0.2 10*3/uL (ref 0.0–0.4)
EOSINOPHILS RELATIVE PERCENT: 6 %
HEMATOCRIT: 34.2 % (ref 34.0–46.6)
HEMOGLOBIN: 11.2 g/dL (ref 11.1–15.9)
IMMATURE GRANULOCYTES: 1 %
LYMPHOCYTES ABSOLUTE COUNT: 0.3 10*3/uL — ABNORMAL LOW (ref 0.7–3.1)
LYMPHOCYTES RELATIVE PERCENT: 11 %
MEAN CORPUSCULAR HEMOGLOBIN CONC: 32.7 g/dL (ref 31.5–35.7)
MEAN CORPUSCULAR HEMOGLOBIN: 29.4 pg (ref 26.6–33.0)
MEAN CORPUSCULAR VOLUME: 90 fL (ref 79–97)
MONOCYTES ABSOLUTE COUNT: 0.1 10*3/uL (ref 0.1–0.9)
MONOCYTES RELATIVE PERCENT: 2 %
NEUTROPHILS RELATIVE PERCENT: 78 %
PLATELET COUNT: 186 10*3/uL (ref 150–450)
RED BLOOD CELL COUNT: 3.81 x10E6/uL (ref 3.77–5.28)
WHITE BLOOD CELL COUNT: 3.1 10*3/uL — ABNORMAL LOW (ref 3.4–10.8)

## 2018-04-07 LAB — EOSINOPHILS RELATIVE PERCENT: Lab: 6

## 2018-04-08 LAB — RENAL FUNCTION PANEL
BLOOD UREA NITROGEN: 17 mg/dL (ref 6–24)
BUN / CREAT RATIO: 13 (ref 9–23)
CHLORIDE: 107 mmol/L — ABNORMAL HIGH (ref 96–106)
CO2: 19 mmol/L — ABNORMAL LOW (ref 20–29)
CREATININE: 1.28 mg/dL — ABNORMAL HIGH (ref 0.57–1.00)
GFR MDRD NON AF AMER: 46 mL/min/{1.73_m2} — ABNORMAL LOW
GLUCOSE: 109 mg/dL — ABNORMAL HIGH (ref 65–99)
PHOSPHORUS, SERUM: 3.3 mg/dL (ref 2.5–4.5)
POTASSIUM: 5.2 mmol/L (ref 3.5–5.2)
SODIUM: 141 mmol/L (ref 134–144)

## 2018-04-08 LAB — GLUCOSE: Lab: 109 — ABNORMAL HIGH

## 2018-04-08 LAB — MAGNESIUM: Lab: 1.6

## 2018-04-09 LAB — TACROLIMUS BLOOD: Lab: 5.8

## 2018-04-12 ENCOUNTER — Ambulatory Visit: Admit: 2018-04-12 | Discharge: 2018-04-12 | Payer: 59 | Attending: Nephrology | Primary: Nephrology

## 2018-04-12 ENCOUNTER — Institutional Professional Consult (permissible substitution)
Admit: 2018-04-12 | Discharge: 2018-04-12 | Payer: 59 | Attending: Pharmacist Clinician (PhC)/ Clinical Pharmacy Specialist | Primary: Pharmacist Clinician (PhC)/ Clinical Pharmacy Specialist

## 2018-04-12 ENCOUNTER — Encounter: Admit: 2018-04-12 | Discharge: 2018-04-12 | Payer: 59

## 2018-04-12 DIAGNOSIS — Z940 Kidney transplant status: Principal | ICD-10-CM

## 2018-04-12 DIAGNOSIS — Z79899 Other long term (current) drug therapy: Secondary | ICD-10-CM

## 2018-04-12 LAB — LEUKOCYTE ESTERASE UA: Lab: NEGATIVE

## 2018-04-12 LAB — URINALYSIS
BLOOD UA: NEGATIVE
GLUCOSE UA: NEGATIVE
KETONES UA: NEGATIVE
LEUKOCYTE ESTERASE UA: NEGATIVE
NITRITE UA: NEGATIVE
PH UA: 5.5 (ref 5.0–9.0)
RBC UA: 1 /HPF (ref <=4)
SPECIFIC GRAVITY UA: 1.022 (ref 1.003–1.030)
SQUAMOUS EPITHELIAL: 1 /HPF (ref 0–5)
UROBILINOGEN UA: 0.2
WBC UA: 1 /HPF (ref 0–5)

## 2018-04-12 LAB — PROTEIN / CREATININE RATIO, URINE
CREATININE, URINE: 163.2 mg/dL
PROTEIN URINE: 18.8 mg/dL

## 2018-04-12 LAB — PROTEIN/CREAT RATIO, URINE: Protein/Creatinine:MRto:Pt:Urine:Qn:: 0.115

## 2018-04-12 MED ORDER — GABAPENTIN 300 MG CAPSULE
ORAL_CAPSULE | Freq: Two times a day (BID) | ORAL | 11 refills | 0 days | Status: CP
Start: 2018-04-12 — End: 2018-06-21
  Filled 2018-04-14: qty 60, 30d supply, fill #0

## 2018-04-12 NOTE — Unmapped (Signed)
[facility] TRANSPLANT CLINIC PHARMACY NOTE  04/12/2018   Danielle Parker  [id number]    Medication changes today:   1. Increase gabapentin to 300 mg bid  2. Added mag ox 400 mg daily to the card  3. Changed prilosec back to BID (pt never stopped checking once daily     Education/Adherence tools provided today:  1.provided updated medication list  2. provided additional pill box education  3.  provided additional education on immunosuppression and transplant related medications including reviewing indications of medications, dosing and side effects    Follow up items:  1. goal of understanding indications and dosing of immunosuppression medications  2. Consider increasing rosuvastatin to 10 mg daily  3.  Vitamin D level     Next visit with pharmacy in 1-3 months  ____________________________________________________________________    Danielle Parker is a 57 y.o. 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     Other PMH significant for hypertension    Seen by pharmacy today for: medication management and pill box fill and adherence education; last seen by pharmacy first visit     CC:  Patient complains of head aching and tension starting from neck upwards and around the head and also complains of tinge like pains      Vitals:    04/12/18 0953   BP: 127/76   Pulse: 60   Temp: 36.4 ??C (97.6 ??F)     Allergies   Allergen Reactions   ??? Buprenorphine Hcl Nausea And Vomiting   ??? Codeine Nausea And Vomiting   ??? Hydrocodone-Acetaminophen Nausea And Vomiting   ??? Morphine Nausea And Vomiting     Halllucinations     All medications reviewed and updated.     Medication list includes revisions made during today???s encounter    Outpatient Encounter Medications as of 04/12/2018   Medication Sig Dispense Refill   ??? acetaminophen (TYLENOL) 500 MG tablet TAKE 1 TO 2 TABLETS (500-1000MG ) BY MOUTH THREE TIMES DAILY AS NEEDED 100 each 99   ??? amLODIPine (NORVASC) 10 MG tablet TAKE ONE TABLET DAILY **USE ONLY AS DIRECTED** 30 tablet 10   ??? aspirin (ECOTRIN) 81 MG tablet Take 81 mg by mouth daily.     ??? budesonide-formoterol (SYMBICORT) 80-4.5 mcg/actuation inhaler INHALE 2 PUFFS TWICE DAILY 10.2 g 11   ??? docusate sodium (COLACE) 100 MG capsule TAKE 1 CAPSULE BY MOUTH TWICE DAILY AS NEEDED FOR CONSTIPATION. 60 capsule 99   ??? metoprolol tartrate (LOPRESSOR) 25 MG tablet Take 3 tablets (75 mg total) by mouth Two (2) times a day. 180 tablet 11   ??? mycophenolate (MYFORTIC) 180 MG EC tablet TAKE 3 TABLETS (540MG ) BY MOUTH TWICE DAILY 180 tablet 10   ??? omeprazole (PRILOSEC) 40 MG capsule Take 1 capsule (40 mg total) by mouth two (2) times a day. 60 capsule 11   ??? OXYGEN-AIR DELIVERY SYSTEMS MISC Inhale 3 L continuous.     ??? polyethylene glycol (MIRALAX) 17 gram packet MIX 1 PACKET IN 4-8 OUNCES OF WATER,JUICE,SODA,COFFEE,OR TEA AND DRINK DAILY AS NEEDED (Patient taking differently: Take 17 g by mouth daily as needed. Indications: constipation) 30 each 0   ??? PROGRAF 1 mg capsule Take 5 capsules (5mg ) by mouth every morning and 5 capsules (5mg ) every evening 300 capsule 11   ??? rosuvastatin (CRESTOR) 5 MG tablet TAKE 1 TABLET BY MOUTH ONCE DAILY 90 tablet 3   ??? sodium bicarbonate 650 mg tablet Take 1 tablet (648 mg total) by mouth  Two (2) times a day. 60 tablet 11   ??? sulfamethoxazole-trimethoprim (BACTRIM,SEPTRA) 400-80 mg per tablet Take 1 tablet (80 mg of trimethoprim total) by mouth 3 (three) times a week. 12 tablet 3   ??? traMADol (ULTRAM) 50 mg tablet Take 1 tablet (50 mg total) by mouth every six (6) hours as needed for pain. Chronic arm pain from previous surgery, 30 days supply 60 tablet 2   ??? VENTOLIN HFA 90 mcg/actuation inhaler INHALE 2 PUFFS ONCE DAILY AS NEEDED FOR SHORTNESS OF BREATH 18 g 1   ??? [DISCONTINUED] gabapentin (NEURONTIN) 300 MG capsule TAKE 1 CAPSULE BY MOUTH NIGHTLY 30 capsule 0   ??? [DISCONTINUED] valGANciclovir (VALCYTE) 450 mg tablet Take 1 tablet (450 mg total) by mouth daily. HOLD 30 tablet 1     No facility-administered encounter medications on file as of 04/12/2018.      Induction agent : alemtuzumab    CURRENT IMMUNOSUPPRESSION: tacrolimus 5 mg PO BID  prograf/cyclosporine goal: 8-10   myfortic 540  mg PO bid (had spaced out previously d/t abdominal pain)   steroid free     Patient is tolerating immunosuppression well.     IMMUNOSUPPRESSION DRUG LEVELS:  Lab Results   Component Value Date    TACROLIMUS 5.8 04/07/2018    TACROLIMUS 8.0 03/31/2018    TACROLIMUS 7.2 03/24/2018    TACROLIMUS 6.2 02/23/2018    TACROLIMUS 6.9 01/28/2018    TACROLIMUS 6.0 01/26/2018     No results found for: CYCLO  No results found for: EVEROLIMUS  No results found for: SIROLIMUS    Did not have labs drawn today    Graft function: stable  DSA: ntd  Biopsies to date: ntd  WBC/ANC:  wnl    Plan:  Tolerating well. No changes.    ID prophylaxis:   CMV Status: D-/ R+, moderate risk. CMV prophylaxis: valganciclovir 450 mg daily x 3 months per protocol (end 04/10/18)  Pt was told over the phone to hold valcyte for low WBC, but valcyte was still in the pill box- so pt never held.   No results found for: CMVCP  PCP Prophylaxis: bactrim SS 1 tab MWF x 6 months. (end 07/10/18)  Thrush: completed in hospital  Patient is  tolerating infectious prophylaxis well    Plan: DC valcyte    CV Prophylaxis: asa 81 mg   The 10-year ASCVD risk score Denman George DC Jr., et al., 2013) is: 6.8%  Statin therapy: Indicated; currently on rosuvastatin 5 mg daily  Plan: Consider uptitrating dose at later date to 10 mg. Continue to monitor     BP: Goal < 140/90. Clinic vitals reported above  Home BP ranges: wnl  Current meds include: amlodipine 10 mg daily, metoprolol 75 mg BID  Plan: Stop hydralazine     Anemia:  H/H:   Lab Results   Component Value Date    HGB 11.2 04/07/2018     Lab Results   Component Value Date    HCT 34.2 04/07/2018     Iron panel:  No results found for: IRON, TIBC, FERRITIN  No results found for: LABIRON    Prior ESA use: none post transplant  Plan: within goal. Continue to monitor.     DM:   Lab Results   Component Value Date    A1C 5.1 01/07/2018   . Goal A1c < 7  History of Dm? No  Diet: Did not address at this visit  Fluid intake: 64 oz water daily  Exercise:not currently  exercising  Plan: Increase water intake to 80-100 oz.  Continue to monitor    Electrolytes: wnl  Meds currently on: none  Plan: Continue to monitor     GI/BM: pt reports abdominal pain has improved with restarting omeprazole and removal or ureteral stent last week   Meds currently on: Miralax PRN (has not been using), docusate BID (taking once daily), omeprazole 40 mg bid - pt prefers to take BID   Plan: Continue to monitor    Pain: pt reports no pain at incision site however has hx of neuropathy that she uses gabapentin for.   Meds currently on:  gabapentin 300 mg HS for neuropathy, pt states neuropathy is still uncontrolled and describes shooting pain  Plan: increase to gabapentin 300 mg BID    Bone health:   Vitamin D Level: none available. Goal > 30.   Last DEXA results:  none available  Current meds include: none  Plan: Vitamin D level  needs to be drawn with next lab schedule.  Continue to monitor.     Women's/Men's Health:  Danielle Parker is a 57 y.o. Female perimenopausal. Patient reports no men's/women's health issues  Plan: Continue to monitor    Adherence: Patient has poor understanding of medications; was able to identify tacrolimus and Bactrim indications but didn't know Myfortic or Valcyte.   Patient knows indications of medications that she was taking prior to transplant.  Patient does require redirection to remain on topic.  Patient  reports that she has started to  fill their own pill box on a regular basis at home. Per transplant coordinator, home health RN is there 3 times weekly and may help with fill  Patient brought medication card:yes  Pill box: States that she uses the one provided by home health nurse.    Plan: Instructed Danielle Parker to bring in all of her medicines and pill box that she uses so that pharmacist can review; provided extensive adherence counseling/intervention    Spent approximately 30 minutes on educating this patient and greater than 50% was spent in direct face to face counseling regarding post transplant medication education. Questions and concerns were address to patient's satisfaction.    Patient was reviewed with Dr. Nestor Lewandowsky who was agreement with the stated plan:     During this visit, the following was completed:   BG log data assessment  BP log data assessment  Labs ordered and evaluated  complex treatment plan >1 DS   Patient education was completed for 11-24 minutes     All questions/concerns were addressed to the patient's satisfaction.  __________________________________________  Noah Charon, PHARMD, CPP  [facility] TRANSPL[facility]  PAGER [phone]

## 2018-04-12 NOTE — Unmapped (Signed)
[facility]   ACUTE/CHRONIC TRANSPLANT FOLLOW UP     PCP: Marton Redwood, MD     Date of Visit at Transplant clinic: 04/12/2018     Graft Status: stable.    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 She was on dialysis for 9 years prior to transplant. Denies family history of stroke or SAH. No history of liver disease.     Creatinine - value stable 1.28 on 04/07/18.   Urine analysis: negative protein/ <1 RBC/ <1 WBC.  Urine protein/creatinine ratio: 0.144  DSA: not done yet.    Last CMV checked: today, pending.  Last BKV checked: today, pending.    CMV: D-/R+  Prophylaxis-  Bactrim SS MWF x 6 months (end 07/10/18)    2) Immunosuppression: Prograf 5 mg BID / Myfortic 540 BID  - Prograf level 6.2 and Last dose of Prograf: 9  - Goal of 12 hour Prograf trough: 8-10  - Changes in Immunosuppression -   - Medications side effects: No     3) HTN - Controlled and has consistently been weaned off of previous medication, most recently losartan.  Goal for B.P - <130/80, at goal on metoprolol tartrate 75mg  BID  Changes in B.P medications - none, adjustment per cardiologist Dr. Bing Neighbors at [facility]. Likely will soon be discharged from heart failure clinic.     4) Anemia - stable   Goal for Hemoglobin > 13.0, has been stable 11.2, continue to monitor    5) MBD - Calcium/Phosphorus - WNL.  PTH - not yet checked   Last Dexa scan - not checked     6) Electrolytes: WNL  Start taking sodium bicarbonate 650mg  BID    7) Immunizations:   Influenza (inactivated only): 08/17/2017   Pneumococcal vaccination (inactivated only): will ask patient.    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  Colonoscopy - 2017 with 10 year follow up recommended     9) COPD - Encourage using albuterol and Symbicort as prescribed. Quit smoking in 2017, previously smoked approximately 1/2 pack per day for greater than 30 years with previous frequent exacerbations, improved with smoking cessation.     10) Tenderness Along Incision: Likely due to dissolving stitch or scar tissue. No pain and tenderness at the graft itself.  Continue to monitor    11) Arthritis BLE  Encourage to discuss with PCP    12) Nerve Pain RUE.  Increase gabapentin 300 mg from daily to BID.    13) Migraines: Atypical description of frontal band tightness followed by occipital tension and neck pain, + light and sound sensitivities. Previously treated in pain clinic with Percocet and injections, she is not sure of what.  Work up with XR cervical spine  Trial of Flexeril as needed at night for possible contributing muscle cramping.   Recommend discussing in detail at next PCP visit with repeat assessment by neurology for headache management.     Follow up: 8 weeks.      Patient was seen and discussed with Dr. Nestor Lewandowsky who is in agreement with the plan.    History of Presenting Illness:   Since patient's last visit in the transplant clinic - patient has been doing well in terms of transplant, taking  transplant medications regularly, no episodes of rejection and no side effects of medications.    Her primary complaint is tenderness along her incision site. She denies pain in her allograft. She denies trouble ambulating. She makes a normal amount of urine. She denies any fever, chills, or recent illness.    She has history of hypertension almost all her life. She had prior 2 week event monitor May 2019 for episodes of palpitations. Results showed normal sinus rhythm with average HR of 69. BP recently has been excellently controlled and she will soon be discharged from the heart failure clinic at [facility]. She is currently only taking metoprolol 75mg  BID. She was taken off of losartan earlier this summer by a cardiologist.    She was previously followed by [facility] for chronic pain to left arm, specifically around the arm. 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 She continued to experience significant pain/cramping during dialysis. Pain was managed with percocet and gabapentin. She was last seen by pain clinic on 02/11/18 and percocet was refilled. However, per note this was patient's last refill as they did not feel she required pain meds now she is off dialysis and will not need to have dialysis access.     She continues to have migraine headaches, roughly once weekly and last for a couple days a night. She describes it as starting as a bandlike sensation around her forehead that then goes posteriorly as a throbbing constant pain. She also endorses posterior neck pain and stiffness that is more persistent even when she does not have a headache. She sometimes takes a Tylenol at the beginning which slows down the intensity but it eventually wears off and she is back to having severe pain. She also has associated light and sound sensitivity. Her headache usually subsides after a couple of days. Many years ago she was told she had migraines but no longer follows regularly with neurology.     Last dose of Prograf at: 9 PM   Diabetes: No:   HTN: Yes    Controlled:Yes    CAD/Heartfailure: Yes - catheterization 03/17/2016 showing on coronary disease     Last Echo/Ejection fraction: Echo 09/21/17 / EF 60-65%     Cardiologist: Dr. Julien Nordmann   Adherence      With Medication: yes     With Follow up: yes      Functional Status: Independent.    Review of Systems:     Fever or chills: negative   Sore throat: negative   Fatigue/malaise: negative   Weight loss or gain: negative   New skin rash/lump or bump: negative   Problems with teeth/gums: negative   Chest pain: negative   Cough or shortness of breath: negative   Swelling: negative   Belly pain/heartburn/nausea/vomiting or diarrhea: positive, pain along RLQ incision. Pain or bleeding when urinating: negative   Twitching/numbness or weakness: negative     Physical Exam:     BP 127/76 (BP Site: L Arm, BP Position: Sitting, BP Cuff Size: Medium)  - Pulse 60  - Temp 36.4 ??C (97.6 ??F) (Temporal)  - Ht 160 cm (5' 2.99)  - Wt 78.9 kg (174 lb)  - BMI 30.83 kg/m??     Nursing note and vitals reviewed.   Constitutional: Oriented to person, place, and time. Appears well-developed and well-nourished. No distress.   HENT:   Head: Normocephalic and atraumatic.   Eyes: Right eye exhibits no discharge. Left  eye exhibits no discharge. No scleral icterus.   Neck: Normal range of motion. Neck supple.   Cardiovascular: Normal rate and regular rhythm. Exam reveals no gallop and no friction rub. No murmur heard. Fistula bruit appreciated.  Pulmonary/Chest: Effort normal and breath sounds normal. No respiratory distress.   Abdominal: well healed ventral incision. Soft. Staples removed; incision well-healed without breakdown or drainage. Palpable <1cm nodule beneath lower half of incision, moderately tender to palpation. Focal small area of tenderness along upper half of incision. No graft tenderness, swelling or erythema.   Musculoskeletal: Normal range of motion. No edema and no tenderness.   Neurological: Alert and oriented to person, place, and time.   Skin: Skin is warm and dry. No rash noted. Not diaphoretic. No erythema. No pallor.   Psychiatric: Normal mood and affect. Behavior is normal. Judgment and thought content normal.     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   Induction: [place]    Maintenance IS at the time of transplant: methylprednisolone taper which ended on 01/11/18, Cellcept 750mg  BID, and Prograf titrated up to 8mg  BID by day of discharge.   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 Patient treated on 7 day course of PO ciprofoxacin, completed on 01/16/18.     Allergies:   Allergies   Allergen Reactions   ??? Buprenorphine Hcl Nausea And Vomiting   ??? Codeine Nausea And Vomiting   ??? Hydrocodone-Acetaminophen Nausea And Vomiting   ??? Morphine Nausea And Vomiting     Halllucinations        Current Medications:   Current Outpatient Medications   Medication Sig Dispense Refill   ??? acetaminophen (TYLENOL) 500 MG tablet TAKE 1 TO 2 TABLETS (500-1000MG ) BY MOUTH THREE TIMES DAILY AS NEEDED 100 each 99   ??? amLODIPine (NORVASC) 10 MG tablet TAKE ONE TABLET DAILY **USE ONLY AS DIRECTED** 30 tablet 10   ??? aspirin (ECOTRIN) 81 MG tablet Take 81 mg by mouth daily.     ??? budesonide-formoterol (SYMBICORT) 80-4.5 mcg/actuation inhaler INHALE 2 PUFFS TWICE DAILY 10.2 g 11   ??? docusate sodium (COLACE) 100 MG capsule TAKE 1 CAPSULE BY MOUTH TWICE DAILY AS NEEDED FOR CONSTIPATION. 60 capsule 99   ??? gabapentin (NEURONTIN) 300 MG capsule TAKE 1 CAPSULE BY MOUTH NIGHTLY 30 capsule 0   ??? metoprolol tartrate (LOPRESSOR) 25 MG tablet Take 3 tablets (75 mg total) by mouth Two (2) times a day. 180 tablet 11   ??? mycophenolate (MYFORTIC) 180 MG EC tablet TAKE 3 TABLETS (540MG ) BY MOUTH TWICE DAILY 180 tablet 10   ??? omeprazole (PRILOSEC) 40 MG capsule Take 1 capsule (40 mg total) by mouth two (2) times a day. 60 capsule 11   ??? OXYGEN-AIR DELIVERY SYSTEMS MISC Inhale 3 L continuous.     ??? polyethylene glycol (MIRALAX) 17 gram packet MIX 1 PACKET IN 4-8 OUNCES OF WATER,JUICE,SODA,COFFEE,OR TEA AND DRINK DAILY  AS NEEDED (Patient taking differently: Take 17 g by mouth daily as needed. Indications: constipation) 30 each 0   ??? PROGRAF 1 mg capsule Take 5 capsules (5mg ) by mouth every morning and 5 capsules (5mg ) every evening 300 capsule 11   ??? rosuvastatin (CRESTOR) 5 MG tablet TAKE 1 TABLET BY MOUTH ONCE DAILY 90 tablet 3   ??? sodium bicarbonate 650 mg tablet Take 1 tablet (648 mg total) by mouth Two (2) times a day. 60 tablet 11   ??? sulfamethoxazole-trimethoprim (BACTRIM,SEPTRA) 400-80 mg per tablet Take 1 tablet (80 mg of trimethoprim total) by mouth 3 (three) times a week. 12 tablet 3   ??? traMADol (ULTRAM) 50 mg tablet Take 1 tablet (50 mg total) by mouth every six (6) hours as needed for pain. Chronic arm pain from previous surgery, 30 days supply 60 tablet 2   ??? VENTOLIN HFA 90 mcg/actuation inhaler INHALE 2 PUFFS ONCE DAILY AS NEEDED FOR SHORTNESS OF BREATH 18 g 1     No current facility-administered medications for this visit.        Past Medical History:   Past Medical History:   Diagnosis Date   ??? Abnormal mammogram    ??? Asthma    ??? Chronic kidney disease    ??? COPD (chronic obstructive pulmonary disease) (CMS-HCC)    ??? GERD (gastroesophageal reflux disease)    ??? Hypertension    ??? Migraine    ??? Nephrotic syndrome         Laboratory studies:     Recent Results (from the past 170 hour(s))   CBC w/ Differential    Collection Time: 04/07/18  8:27 AM   Result Value Ref Range    WBC 3.1 (L) 3.4 - 10.8 x10E3/uL    RBC 3.81 3.77 - 5.28 x10E6/uL    HGB 11.2 11.1 - 15.9 g/dL    HCT 16.1 09.6 - 04.5 %    MCV 90 79 - 97 fL    MCH 29.4 26.6 - 33.0 pg    MCHC 32.7 31.5 - 35.7 g/dL    RDW 40.9 (H) 81.1 - 15.4 %    Platelet 186 150 - 450 x10E3/uL    Neutrophils % 78 Not Estab. %    Lymphocytes % 11 Not Estab. %    Monocytes % 2 Not Estab. %    Eosinophils % 6 Not Estab. %    Basophils % 2 Not Estab. %    Absolute Neutrophils 2.4 1.4 - 7.0 x10E3/uL    Absolute Lymphocytes 0.3 (L) 0.7 - 3.1 x10E3/uL    Absolute Monocytes  0.1 0.1 - 0.9 x10E3/uL    Absolute Eosinophils 0.2 0.0 - 0.4 x10E3/uL    Absolute Basophils  0.1 0.0 - 0.2 x10E3/uL    Immature Granulocytes 1 Not Estab. %    Bands Absolute 0.0 0.0 - 0.1 x10E3/uL   Magnesium Level Collection Time: 04/07/18  8:27 AM   Result Value Ref Range    Magnesium 1.6 1.6 - 2.3 mg/dL   Tacrolimus level    Collection Time: 04/07/18  8:27 AM   Result Value Ref Range    Tacrolimus Lvl 5.8 2.0 - 20.0 ng/mL   Renal Function Panel    Collection Time: 04/07/18  8:27 AM   Result Value Ref Range    Glucose 109 (H) 65 - 99 mg/dL    BUN 17 6 - 24 mg/dL    Creatinine 9.14 (H) 0.57 - 1.00 mg/dL    GFR MDRD Non  Af Amer 46 (L) >59 mL/min/1.73    GFR MDRD Af Amer 53 (L) >59 mL/min/1.73    BUN/Creatinine Ratio 13 9 - 23    Sodium 141 134 - 144 mmol/L    Potassium 5.2 3.5 - 5.2 mmol/L    Chloride 107 (H) 96 - 106 mmol/L    CO2 19 (L) 20 - 29 mmol/L    Calcium 10.0 8.7 - 10.2 mg/dL    Phosphorus, Serum 3.3 2.5 - 4.5 mg/dL    Albumin 4.3 3.5 - 5.5 g/dL

## 2018-04-12 NOTE — Unmapped (Addendum)
Please increase your Gabapentin to 300mg  twice a day.     You can also take Flexeril at night to help your neck tightness and back of the head pain.    For your headache pain, you are being referred back to neurology doctors and an x-ray of your neck.

## 2018-04-14 MED FILL — MYFORTIC 180 MG TABLET,DELAYED RELEASE: 30 days supply | Qty: 180 | Fill #0 | Status: AC

## 2018-04-14 MED FILL — MYFORTIC 180 MG TABLET,DELAYED RELEASE: 30 days supply | Qty: 180 | Fill #0

## 2018-04-14 MED FILL — GABAPENTIN 300 MG CAPSULE: 30 days supply | Qty: 60 | Fill #0 | Status: AC

## 2018-04-14 NOTE — Unmapped (Signed)
[facility] Refill Coordination Note    Specialty Medication(s) to be Shipped:   Transplant: Myfortic 180mg   Other medication(s) to be shipped: Gabapentin 300mg      Ruqaya Strauss, DOB: 1961/06/17  Phone: [phone] (home)   Shipping Address: [address]  [place] [address]    All above HIPAA information was verified with patient.     Completed refill call assessment today to schedule patient's medication shipment from the [facility] [phone]).       Specialty medication(s) and dose(s) confirmed: Regimen is correct and unchanged.   Changes to medications: [place] reports no changes reported at this time.  Changes to insurance: No  Questions for the pharmacist: No    The patient will receive a drug information handout for each medication shipped and additional FDA Medication Guides as required.      DISEASE/MEDICATION-SPECIFIC INFORMATION        N/A    ADHERENCE     Medication Adherence    Patient reported X missed doses in the last month:  0         MEDICARE PART B DOCUMENTATION     Myfortic 180mg : Patient has 1 tablets on hand.    SHIPPING     Shipping address confirmed in Epic.     Delivery Scheduled: Yes, Expected medication delivery date: 04/14/2018 via UPS or courier.     Zamyiah Tino Leodis Binet   [facility] Specialty Technician

## 2018-04-18 LAB — CBC W/ DIFFERENTIAL
BANDED NEUTROPHILS ABSOLUTE COUNT: 0.1 10*3/uL (ref 0.0–0.1)
BASOPHILS ABSOLUTE COUNT: 0.1 10*3/uL (ref 0.0–0.2)
BASOPHILS RELATIVE PERCENT: 4 %
EOSINOPHILS ABSOLUTE COUNT: 0.2 10*3/uL (ref 0.0–0.4)
EOSINOPHILS RELATIVE PERCENT: 8 %
HEMATOCRIT: 33.1 % — ABNORMAL LOW (ref 34.0–46.6)
HEMOGLOBIN: 11 g/dL — ABNORMAL LOW (ref 11.1–15.9)
LYMPHOCYTES ABSOLUTE COUNT: 0.3 10*3/uL — ABNORMAL LOW (ref 0.7–3.1)
LYMPHOCYTES RELATIVE PERCENT: 14 %
MEAN CORPUSCULAR HEMOGLOBIN CONC: 33.2 g/dL (ref 31.5–35.7)
MEAN CORPUSCULAR VOLUME: 91 fL (ref 79–97)
MONOCYTES ABSOLUTE COUNT: 0.1 10*3/uL (ref 0.1–0.9)
MONOCYTES RELATIVE PERCENT: 5 %
NEUTROPHILS RELATIVE PERCENT: 66 %
PLATELET COUNT: 204 10*3/uL (ref 150–450)
RED BLOOD CELL COUNT: 3.65 x10E6/uL — ABNORMAL LOW (ref 3.77–5.28)
RED CELL DISTRIBUTION WIDTH: 15.7 % — ABNORMAL HIGH (ref 12.3–15.4)
WHITE BLOOD CELL COUNT: 1.9 10*3/uL — CL (ref 3.4–10.8)

## 2018-04-18 LAB — NEUTROPHILS ABSOLUTE COUNT: Lab: 1.3 — ABNORMAL LOW

## 2018-04-19 LAB — RENAL FUNCTION PANEL
ALBUMIN: 3.8 g/dL (ref 3.5–5.5)
BLOOD UREA NITROGEN: 21 mg/dL (ref 6–24)
CALCIUM: 9.4 mg/dL (ref 8.7–10.2)
CHLORIDE: 108 mmol/L — ABNORMAL HIGH (ref 96–106)
CO2: 19 mmol/L — ABNORMAL LOW (ref 20–29)
CREATININE: 1.31 mg/dL — ABNORMAL HIGH (ref 0.57–1.00)
GFR MDRD AF AMER: 51 mL/min/{1.73_m2} — ABNORMAL LOW
GFR MDRD NON AF AMER: 45 mL/min/{1.73_m2} — ABNORMAL LOW
GLUCOSE: 124 mg/dL — ABNORMAL HIGH (ref 65–99)
PHOSPHORUS, SERUM: 3.5 mg/dL (ref 2.5–4.5)
SODIUM: 141 mmol/L (ref 134–144)

## 2018-04-19 LAB — MAGNESIUM: Lab: 1.6

## 2018-04-19 LAB — TACROLIMUS LEVEL: TACROLIMUS BLOOD: 8.5 ng/mL (ref 2.0–20.0)

## 2018-04-19 LAB — CALCIUM: Lab: 9.4

## 2018-04-19 LAB — TACROLIMUS BLOOD: Lab: 8.5

## 2018-04-20 MED FILL — AMLODIPINE 10 MG TABLET: 30 days supply | Qty: 30 | Fill #1

## 2018-04-20 MED FILL — ACETAMINOPHEN 500 MG TABLET: 17 days supply | Qty: 100 | Fill #0 | Status: AC

## 2018-04-20 MED FILL — DOK 100 MG CAPSULE: ORAL | 30 days supply | Qty: 60 | Fill #1

## 2018-04-20 MED FILL — AMLODIPINE 10 MG TABLET: 30 days supply | Qty: 30 | Fill #1 | Status: AC

## 2018-04-20 MED FILL — DOK 100 MG CAPSULE: 30 days supply | Qty: 60 | Fill #1 | Status: AC

## 2018-04-26 LAB — CBC W/ DIFFERENTIAL
BASOPHILS RELATIVE PERCENT: 1 %
EOSINOPHILS ABSOLUTE COUNT: 0.1 10*3/uL (ref 0.0–0.4)
EOSINOPHILS RELATIVE PERCENT: 4 %
HEMATOCRIT: 34.6 % (ref 34.0–46.6)
HEMOGLOBIN: 11 g/dL — ABNORMAL LOW (ref 11.1–15.9)
LYMPHOCYTES ABSOLUTE COUNT: 0.1 10*3/uL — ABNORMAL LOW (ref 0.7–3.1)
LYMPHOCYTES RELATIVE PERCENT: 5 %
MEAN CORPUSCULAR HEMOGLOBIN CONC: 31.8 g/dL (ref 31.5–35.7)
MEAN CORPUSCULAR HEMOGLOBIN: 28.6 pg (ref 26.6–33.0)
MEAN CORPUSCULAR VOLUME: 90 fL (ref 79–97)
MONOCYTES RELATIVE PERCENT: 17 %
NEUTROPHILS ABSOLUTE COUNT: 2.1 10*3/uL (ref 1.4–7.0)
NEUTROPHILS RELATIVE PERCENT: 73 %
PLATELET COUNT: 211 10*3/uL (ref 150–450)
RED BLOOD CELL COUNT: 3.85 x10E6/uL (ref 3.77–5.28)
RED CELL DISTRIBUTION WIDTH: 15.6 % — ABNORMAL HIGH (ref 12.3–15.4)
WHITE BLOOD CELL COUNT: 2.9 10*3/uL — ABNORMAL LOW (ref 3.4–10.8)

## 2018-04-26 LAB — RENAL FUNCTION PANEL
ALBUMIN: 4.2 g/dL (ref 3.5–5.5)
BLOOD UREA NITROGEN: 11 mg/dL (ref 6–24)
BUN / CREAT RATIO: 10 (ref 9–23)
CALCIUM: 9.6 mg/dL (ref 8.7–10.2)
CHLORIDE: 104 mmol/L (ref 96–106)
CREATININE: 1.1 mg/dL — ABNORMAL HIGH (ref 0.57–1.00)
GFR MDRD AF AMER: 64 mL/min/{1.73_m2}
GFR MDRD NON AF AMER: 55 mL/min/{1.73_m2} — ABNORMAL LOW
GLUCOSE: 114 mg/dL — ABNORMAL HIGH (ref 65–99)
PHOSPHORUS, SERUM: 3.2 mg/dL (ref 2.5–4.5)
POTASSIUM: 4.5 mmol/L (ref 3.5–5.2)

## 2018-04-26 LAB — MAGNESIUM: Lab: 1.4 — ABNORMAL LOW

## 2018-04-26 LAB — CREATININE: Lab: 1.1 — ABNORMAL HIGH

## 2018-04-26 LAB — TACROLIMUS BLOOD: Lab: 8.4

## 2018-04-26 LAB — NEUTROPHILS ABSOLUTE COUNT: Lab: 2.1

## 2018-04-27 MED FILL — SULFAMETHOXAZOLE 400 MG-TRIMETHOPRIM 80 MG TABLET: ORAL | 28 days supply | Qty: 12 | Fill #1

## 2018-04-27 MED FILL — METOPROLOL TARTRATE 25 MG TABLET: ORAL | 30 days supply | Qty: 180 | Fill #1

## 2018-04-27 MED FILL — SULFAMETHOXAZOLE 400 MG-TRIMETHOPRIM 80 MG TABLET: 28 days supply | Qty: 12 | Fill #1 | Status: AC

## 2018-04-27 MED FILL — METOPROLOL TARTRATE 25 MG TABLET: 30 days supply | Qty: 180 | Fill #1 | Status: AC

## 2018-05-02 LAB — CBC W/ DIFFERENTIAL
BANDED NEUTROPHILS ABSOLUTE COUNT: 0.1 10*3/uL (ref 0.0–0.1)
BASOPHILS ABSOLUTE COUNT: 0.1 10*3/uL (ref 0.0–0.2)
BASOPHILS RELATIVE PERCENT: 2 %
EOSINOPHILS ABSOLUTE COUNT: 0.2 10*3/uL (ref 0.0–0.4)
EOSINOPHILS RELATIVE PERCENT: 3 %
HEMATOCRIT: 34.1 % (ref 34.0–46.6)
HEMOGLOBIN: 10.9 g/dL — ABNORMAL LOW (ref 11.1–15.9)
IMMATURE GRANULOCYTES: 1 %
LYMPHOCYTES ABSOLUTE COUNT: 0.7 10*3/uL (ref 0.7–3.1)
LYMPHOCYTES RELATIVE PERCENT: 14 %
MEAN CORPUSCULAR HEMOGLOBIN CONC: 32 g/dL (ref 31.5–35.7)
MEAN CORPUSCULAR HEMOGLOBIN: 28.9 pg (ref 26.6–33.0)
MEAN CORPUSCULAR VOLUME: 91 fL (ref 79–97)
MONOCYTES ABSOLUTE COUNT: 0.3 10*3/uL (ref 0.1–0.9)
MONOCYTES RELATIVE PERCENT: 6 %
NEUTROPHILS ABSOLUTE COUNT: 3.6 10*3/uL (ref 1.4–7.0)
NEUTROPHILS RELATIVE PERCENT: 74 %
RED BLOOD CELL COUNT: 3.77 x10E6/uL (ref 3.77–5.28)
RED CELL DISTRIBUTION WIDTH: 15.2 % (ref 12.3–15.4)

## 2018-05-02 LAB — MEAN CORPUSCULAR HEMOGLOBIN: Lab: 28.9

## 2018-05-03 LAB — BASIC METABOLIC PANEL
BLOOD UREA NITROGEN: 22 mg/dL (ref 6–24)
BUN / CREAT RATIO: 17 (ref 9–23)
CALCIUM: 9.8 mg/dL (ref 8.7–10.2)
CHLORIDE: 102 mmol/L (ref 96–106)
CO2: 21 mmol/L (ref 20–29)
CREATININE: 1.29 mg/dL — ABNORMAL HIGH (ref 0.57–1.00)
GFR MDRD AF AMER: 52 mL/min/{1.73_m2} — ABNORMAL LOW
GFR MDRD NON AF AMER: 45 mL/min/{1.73_m2} — ABNORMAL LOW
GLUCOSE: 123 mg/dL — ABNORMAL HIGH (ref 65–99)
POTASSIUM: 4.6 mmol/L (ref 3.5–5.2)

## 2018-05-03 LAB — MAGNESIUM: Lab: 1.7

## 2018-05-03 LAB — PHOSPHORUS, SERUM: Lab: 3.5

## 2018-05-03 LAB — GFR MDRD NON AF AMER: Lab: 45 — ABNORMAL LOW

## 2018-05-03 NOTE — Unmapped (Signed)
Patient called [facility]  She asked for metoprolol, prograf, rosuvastatin  She is good on everything else    [facility] Refill Coordination Note    Specialty Medication(s) to be Shipped:   Transplant: Prograf 1mg     Other medication(s) to be shipped: rosuvastatin, metoprolol     Danielle Parker, DOB: Dec 20, 1960  Phone: [phone] (home)   Shipping Address: [address]  [place] [address]    All above HIPAA information was verified with patient.     Completed refill call assessment today to schedule patient's medication shipment from the [facility] [phone]).       Specialty medication(s) and dose(s) confirmed: Regimen is correct and unchanged.   Changes to medications: Shawnita reports no changes reported at this time.  Changes to insurance: No  Questions for the pharmacist: No    The patient will receive a drug information handout for each medication shipped and additional FDA Medication Guides as required.      DISEASE/MEDICATION-SPECIFIC INFORMATION        N/A    ADHERENCE     She hasn't missed any doses of her medications listed here      MEDICARE PART B DOCUMENTATION     Prograf 1mg : Patient has 10 days of capsules on hand.    SHIPPING     Shipping address confirmed in Epic.     Delivery Scheduled: Yes, Expected medication delivery date: 9/26 via UPS or courier.     [name]   [facility] Specialty Technician

## 2018-05-04 LAB — TACROLIMUS BLOOD: Lab: 6.9

## 2018-05-04 LAB — CMV DNA, QUANTITATIVE, PCR: CMV QUANT: NEGATIVE [IU]/mL

## 2018-05-04 LAB — CMV QUANT: Lab: NEGATIVE

## 2018-05-04 MED FILL — PROGRAF 1 MG CAPSULE: 30 days supply | Qty: 300 | Fill #1

## 2018-05-04 MED FILL — PROGRAF 1 MG CAPSULE: 30 days supply | Qty: 300 | Fill #1 | Status: AC

## 2018-05-05 MED ORDER — OMEPRAZOLE 40 MG CAPSULE,DELAYED RELEASE
ORAL_CAPSULE | Freq: Two times a day (BID) | ORAL | 11 refills | 0.00000 days | Status: CP
Start: 2018-05-05 — End: 2019-05-05
  Filled 2018-06-07: qty 60, 30d supply, fill #0

## 2018-05-09 LAB — CBC W/ DIFFERENTIAL
BANDED NEUTROPHILS ABSOLUTE COUNT: 0.1 10*3/uL (ref 0.0–0.1)
BASOPHILS ABSOLUTE COUNT: 0.1 10*3/uL (ref 0.0–0.2)
EOSINOPHILS ABSOLUTE COUNT: 0.1 10*3/uL (ref 0.0–0.4)
EOSINOPHILS RELATIVE PERCENT: 3 %
HEMATOCRIT: 34.7 % (ref 34.0–46.6)
HEMOGLOBIN: 11.2 g/dL (ref 11.1–15.9)
IMMATURE GRANULOCYTES: 2 %
LYMPHOCYTES ABSOLUTE COUNT: 0.6 10*3/uL — ABNORMAL LOW (ref 0.7–3.1)
LYMPHOCYTES RELATIVE PERCENT: 15 %
MEAN CORPUSCULAR HEMOGLOBIN CONC: 32.3 g/dL (ref 31.5–35.7)
MEAN CORPUSCULAR HEMOGLOBIN: 28.9 pg (ref 26.6–33.0)
MEAN CORPUSCULAR VOLUME: 90 fL (ref 79–97)
MONOCYTES ABSOLUTE COUNT: 0.4 10*3/uL (ref 0.1–0.9)
MONOCYTES RELATIVE PERCENT: 10 %
NEUTROPHILS ABSOLUTE COUNT: 2.8 10*3/uL (ref 1.4–7.0)
NEUTROPHILS RELATIVE PERCENT: 68 %
RED BLOOD CELL COUNT: 3.87 x10E6/uL (ref 3.77–5.28)
RED CELL DISTRIBUTION WIDTH: 15.1 % (ref 12.3–15.4)
WHITE BLOOD CELL COUNT: 4.1 10*3/uL (ref 3.4–10.8)

## 2018-05-09 LAB — MEAN CORPUSCULAR VOLUME: Lab: 90

## 2018-05-09 MED ORDER — ROSUVASTATIN 5 MG TABLET
ORAL | 0 days
Start: 2018-05-09 — End: ?

## 2018-05-09 MED FILL — ROSUVASTATIN 5 MG TABLET: 90 days supply | Qty: 90 | Fill #0 | Status: AC

## 2018-05-10 LAB — BASIC METABOLIC PANEL
BUN / CREAT RATIO: 14 (ref 9–23)
CALCIUM: 10 mg/dL (ref 8.7–10.2)
CHLORIDE: 103 mmol/L (ref 96–106)
CO2: 21 mmol/L (ref 20–29)
CREATININE: 1.17 mg/dL — ABNORMAL HIGH (ref 0.57–1.00)
GFR MDRD AF AMER: 59 mL/min/{1.73_m2} — ABNORMAL LOW
GFR MDRD NON AF AMER: 51 mL/min/{1.73_m2} — ABNORMAL LOW
GLUCOSE: 117 mg/dL — ABNORMAL HIGH (ref 65–99)
POTASSIUM: 4.6 mmol/L (ref 3.5–5.2)

## 2018-05-10 LAB — PHOSPHORUS, SERUM: Lab: 3.7

## 2018-05-10 LAB — MAGNESIUM: Lab: 1.6

## 2018-05-10 LAB — BUN / CREAT RATIO: Lab: 14

## 2018-05-10 MED ORDER — MAGNESIUM OXIDE-MAGNESIUM AMINO ACID CHELATE 133 MG TABLET
5 refills | 0 days | Status: CP
Start: 2018-05-10 — End: 2018-05-31
  Filled 2018-05-12: qty 100, 50d supply, fill #0

## 2018-05-10 NOTE — Unmapped (Signed)
[facility] Refill and Clinical Coordination Note  Medication(s): Myfortic 180    Abeni Finchum, [place]: [birth year]  Phone: [phone] (home) , Alternate phone contact: N/A  Shipping address: [address]  [place] [address]  Phone or address changes today?: No  All above HIPAA information verified.  Insurance changes? No    Completed refill and clinical call assessment today to schedule patient's medication shipment from the [facility] [phone]).      MEDICATION RECONCILIATION    Confirmed the medication and dosage are correct and have not changed: Yes, regimen is correct and unchanged.    Were there any changes to your medication(s) in the past month:  No, there are no changes reported at this time.    ADHERENCE    Is this medicine transplant or covered by Medicare Part B? Yes.    Myfortic 180mg : Patient has 5 days worth of tablets on hand.    Did you miss any doses in the past 4 weeks? No missed doses reported.  Adherence counseling provided? Not needed     SIDE EFFECT MANAGEMENT    Are you tolerating your medication?:  Takeya reports tolerating the medication.  Side effect management discussed: None      Therapy is appropriate and should be continued.    Evidence of clinical benefit: See Epic note from 04/12/18      FINANCIAL/SHIPPING    Delivery Scheduled: Yes, Expected medication delivery date: 05/13/18     Additional medications refilled: magnesium oxide-MF AA Chelate 133mg     The patient will receive a drug information handout for each medication shipped and additional FDA Medication Guides as required.      Maralee did not have any additional questions at this time.    Delivery address confirmed in Epic.     We will follow up with patient monthly for standard refill processing and delivery.      Thank you,  Tera Helper   [facility] Specialty Pharmacist

## 2018-05-11 LAB — TACROLIMUS LEVEL: TACROLIMUS BLOOD: 7.3 ng/mL (ref 2.0–20.0)

## 2018-05-11 LAB — CMV DNA, QUANTITATIVE, PCR: CMV QUANT: NEGATIVE [IU]/mL

## 2018-05-11 LAB — CMV QUANT: Lab: NEGATIVE

## 2018-05-11 LAB — TACROLIMUS BLOOD: Lab: 7.3

## 2018-05-12 MED FILL — MG-PLUS-PROTEIN 133 MG TABLET: 50 days supply | Qty: 100 | Fill #0 | Status: AC

## 2018-05-12 MED FILL — MYFORTIC 180 MG TABLET,DELAYED RELEASE: 30 days supply | Qty: 180 | Fill #1

## 2018-05-12 MED FILL — MYFORTIC 180 MG TABLET,DELAYED RELEASE: 30 days supply | Qty: 180 | Fill #1 | Status: AC

## 2018-05-16 LAB — CBC W/ DIFFERENTIAL
BANDED NEUTROPHILS ABSOLUTE COUNT: 0.1 10*3/uL (ref 0.0–0.1)
BASOPHILS ABSOLUTE COUNT: 0 10*3/uL (ref 0.0–0.2)
BASOPHILS RELATIVE PERCENT: 1 %
EOSINOPHILS ABSOLUTE COUNT: 0.2 10*3/uL (ref 0.0–0.4)
EOSINOPHILS RELATIVE PERCENT: 4 %
HEMATOCRIT: 34.7 % (ref 34.0–46.6)
HEMOGLOBIN: 11.3 g/dL (ref 11.1–15.9)
LYMPHOCYTES ABSOLUTE COUNT: 0.5 10*3/uL — ABNORMAL LOW (ref 0.7–3.1)
LYMPHOCYTES RELATIVE PERCENT: 11 %
MEAN CORPUSCULAR HEMOGLOBIN CONC: 32.6 g/dL (ref 31.5–35.7)
MEAN CORPUSCULAR HEMOGLOBIN: 29.7 pg (ref 26.6–33.0)
MEAN CORPUSCULAR VOLUME: 91 fL (ref 79–97)
MONOCYTES ABSOLUTE COUNT: 0.4 10*3/uL (ref 0.1–0.9)
MONOCYTES RELATIVE PERCENT: 10 %
NEUTROPHILS ABSOLUTE COUNT: 3.1 10*3/uL (ref 1.4–7.0)
NEUTROPHILS RELATIVE PERCENT: 73 %
RED BLOOD CELL COUNT: 3.81 x10E6/uL (ref 3.77–5.28)
RED CELL DISTRIBUTION WIDTH: 14.4 % (ref 12.3–15.4)
WHITE BLOOD CELL COUNT: 4.3 10*3/uL (ref 3.4–10.8)

## 2018-05-16 LAB — MEAN CORPUSCULAR HEMOGLOBIN CONC: Lab: 32.6

## 2018-05-16 IMAGING — CR DG CHEST 2V
1 series · 3 of 3 positions shown · non-contrast
Comparison: Most recent chest radiograph 01/15/2016, chest CT
01/10/2016

CLINICAL DATA: Chest pain and difficulty breathing. Progressive
shortness of breath for 3 days. Midsternal chest pain.

EXAM:
CHEST  2 VIEW

[Series 1: dg chest 2 view · 0.14mm/px · 3 of 3 slices shown]
[im 1/3]
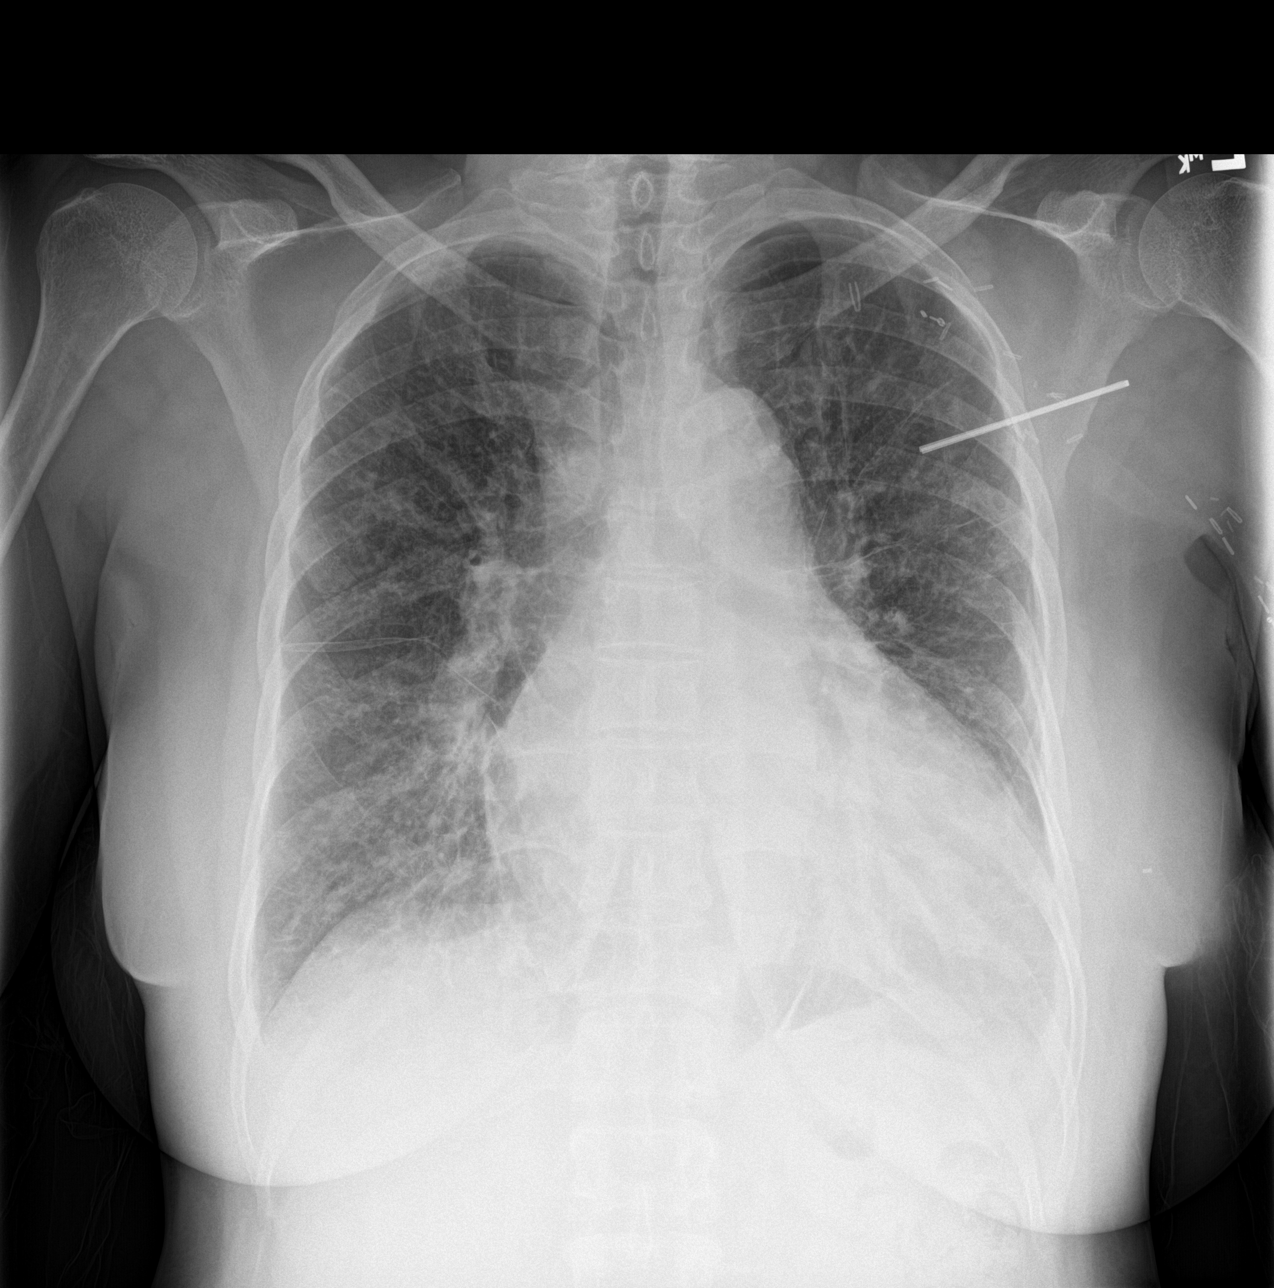
[im 2/3]
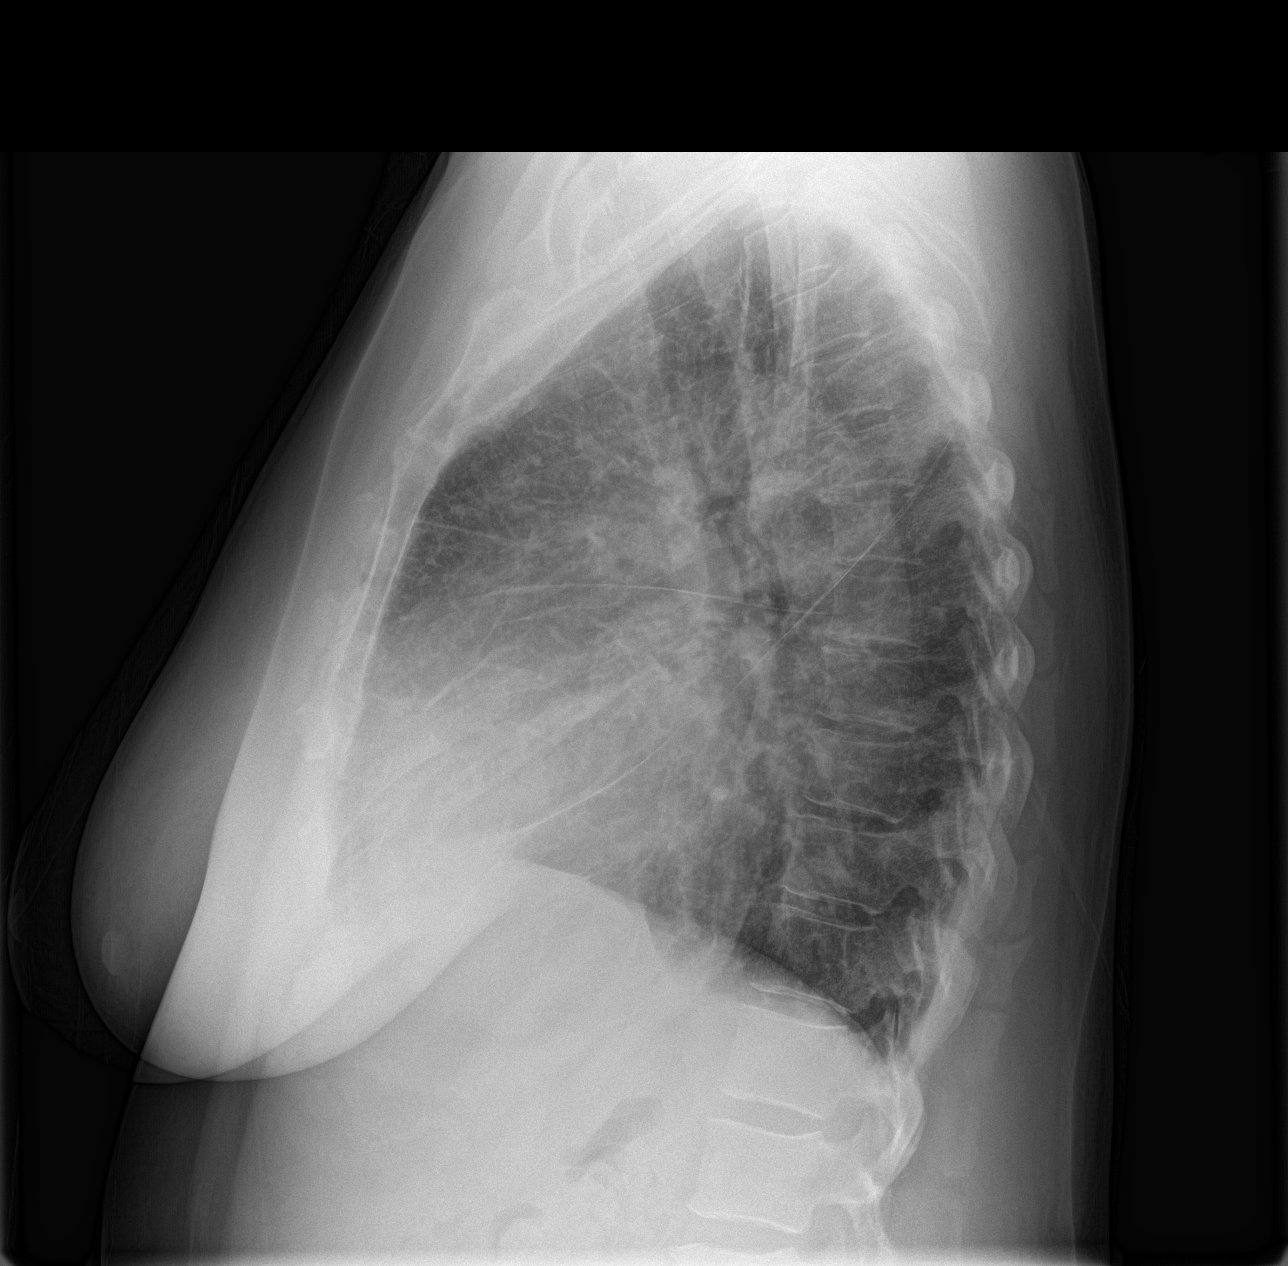
[im 3/3]
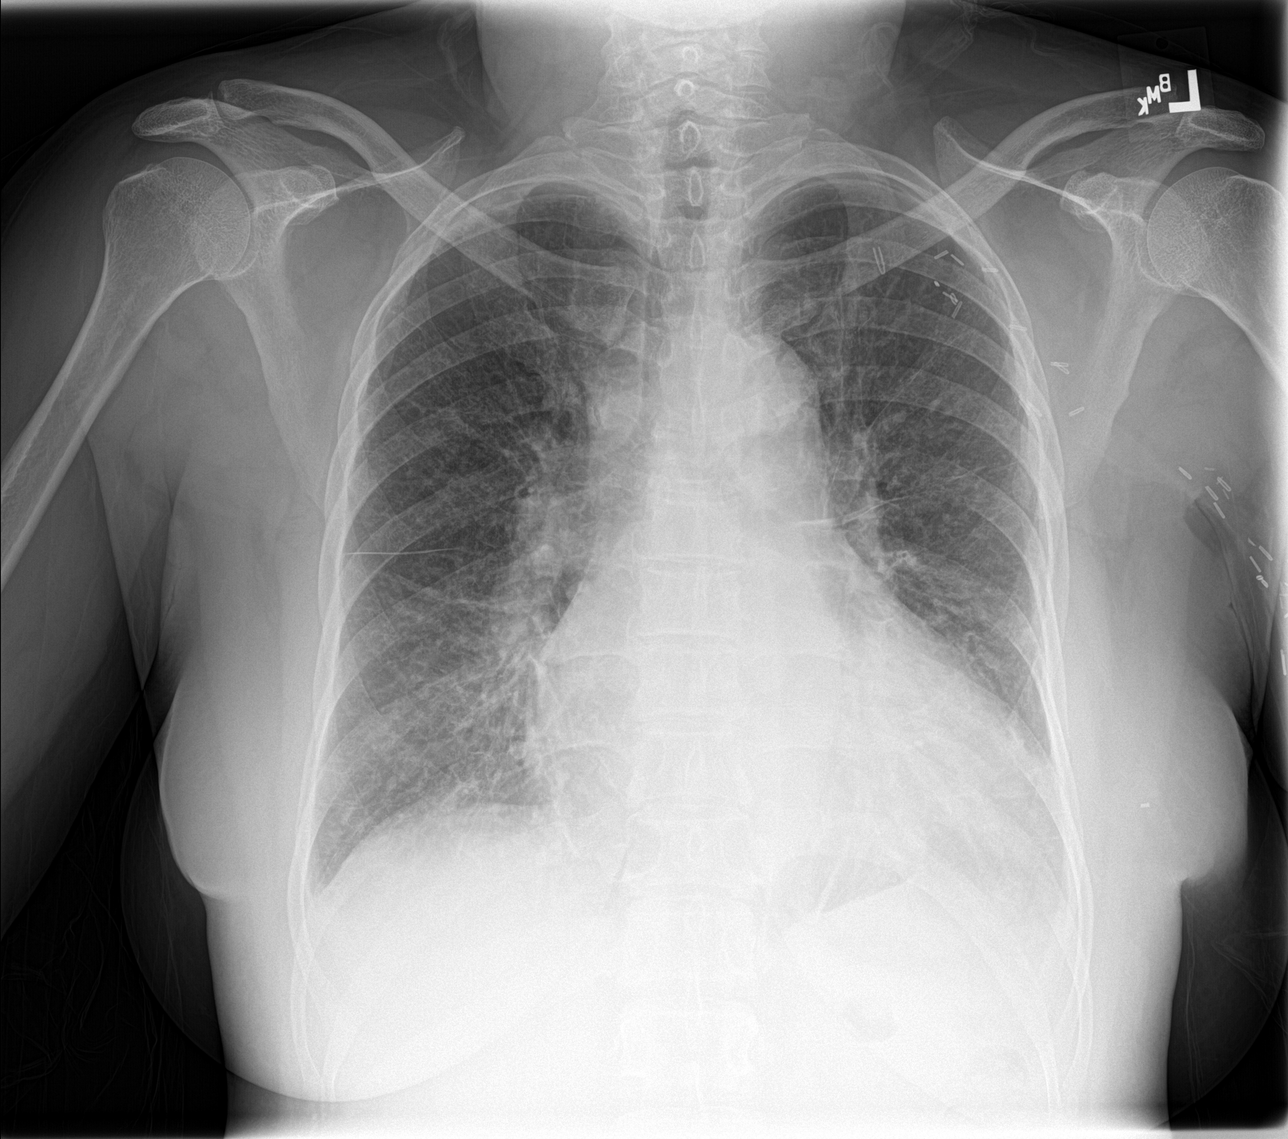

[3 of 3 positions shown; findings below may reference images not displayed]

FINDINGS: Stable cardiomegaly. Development of pulmonary edema since prior
exam. There is fissural thickening and small left pleural effusion.
Bibasilar atelectasis, no confluent airspace disease. Surgical clips
in the left axilla and subclavian region.
IMPRESSION: Findings consistent with CHF, mild-to-moderate in degree.

## 2018-05-17 LAB — RENAL FUNCTION PANEL
ALBUMIN: 4.3 g/dL (ref 3.5–5.5)
BUN / CREAT RATIO: 14 (ref 9–23)
CALCIUM: 9.6 mg/dL (ref 8.7–10.2)
CHLORIDE: 105 mmol/L (ref 96–106)
CO2: 18 mmol/L — ABNORMAL LOW (ref 20–29)
CREATININE: 1.06 mg/dL — ABNORMAL HIGH (ref 0.57–1.00)
GFR MDRD AF AMER: 66 mL/min/{1.73_m2}
GFR MDRD NON AF AMER: 58 mL/min/{1.73_m2} — ABNORMAL LOW
POTASSIUM: 5 mmol/L (ref 3.5–5.2)
SODIUM: 139 mmol/L (ref 134–144)

## 2018-05-17 LAB — MAGNESIUM: Lab: 1.6

## 2018-05-17 LAB — BLOOD UREA NITROGEN: Lab: 15

## 2018-05-17 LAB — TACROLIMUS BLOOD: Lab: 8.4

## 2018-05-19 MED FILL — DOK 100 MG CAPSULE: 30 days supply | Qty: 60 | Fill #2 | Status: AC

## 2018-05-19 MED FILL — VENTOLIN HFA 90 MCG/ACTUATION AEROSOL INHALER: 25 days supply | Qty: 18 | Fill #0 | Status: AC

## 2018-05-19 MED FILL — AMLODIPINE 10 MG TABLET: 30 days supply | Qty: 30 | Fill #2

## 2018-05-19 MED FILL — SULFAMETHOXAZOLE 400 MG-TRIMETHOPRIM 80 MG TABLET: 28 days supply | Qty: 12 | Fill #2 | Status: AC

## 2018-05-19 MED FILL — GABAPENTIN 300 MG CAPSULE: ORAL | 30 days supply | Qty: 60 | Fill #1

## 2018-05-19 MED FILL — AMLODIPINE 10 MG TABLET: 30 days supply | Qty: 30 | Fill #2 | Status: AC

## 2018-05-19 MED FILL — DOK 100 MG CAPSULE: ORAL | 30 days supply | Qty: 60 | Fill #2

## 2018-05-19 MED FILL — SULFAMETHOXAZOLE 400 MG-TRIMETHOPRIM 80 MG TABLET: ORAL | 28 days supply | Qty: 12 | Fill #2

## 2018-05-19 MED FILL — GABAPENTIN 300 MG CAPSULE: 30 days supply | Qty: 60 | Fill #1 | Status: AC

## 2018-05-23 LAB — CBC W/ DIFFERENTIAL
BANDED NEUTROPHILS ABSOLUTE COUNT: 0 10*3/uL (ref 0.0–0.1)
BASOPHILS ABSOLUTE COUNT: 0.1 10*3/uL (ref 0.0–0.2)
BASOPHILS RELATIVE PERCENT: 1 %
EOSINOPHILS ABSOLUTE COUNT: 0.2 10*3/uL (ref 0.0–0.4)
EOSINOPHILS RELATIVE PERCENT: 6 %
HEMATOCRIT: 33.8 % — ABNORMAL LOW (ref 34.0–46.6)
HEMOGLOBIN: 11.1 g/dL (ref 11.1–15.9)
IMMATURE GRANULOCYTES: 1 %
LYMPHOCYTES ABSOLUTE COUNT: 0.5 10*3/uL — ABNORMAL LOW (ref 0.7–3.1)
LYMPHOCYTES RELATIVE PERCENT: 14 %
MEAN CORPUSCULAR HEMOGLOBIN CONC: 32.8 g/dL (ref 31.5–35.7)
MEAN CORPUSCULAR HEMOGLOBIN: 29.1 pg (ref 26.6–33.0)
MEAN CORPUSCULAR VOLUME: 89 fL (ref 79–97)
MONOCYTES ABSOLUTE COUNT: 0.3 10*3/uL (ref 0.1–0.9)
MONOCYTES RELATIVE PERCENT: 9 %
NEUTROPHILS ABSOLUTE COUNT: 2.6 10*3/uL (ref 1.4–7.0)
PLATELET COUNT: 208 10*3/uL (ref 150–450)
RED BLOOD CELL COUNT: 3.81 x10E6/uL (ref 3.77–5.28)
WHITE BLOOD CELL COUNT: 3.8 10*3/uL (ref 3.4–10.8)

## 2018-05-23 LAB — PLATELET COUNT: Lab: 208

## 2018-05-23 MED FILL — METOPROLOL TARTRATE 25 MG TABLET: ORAL | 30 days supply | Qty: 180 | Fill #2

## 2018-05-23 MED FILL — METOPROLOL TARTRATE 25 MG TABLET: 30 days supply | Qty: 180 | Fill #2 | Status: AC

## 2018-05-24 LAB — TACROLIMUS BLOOD: Lab: 5.9

## 2018-05-24 LAB — BASIC METABOLIC PANEL
CALCIUM: 9.9 mg/dL (ref 8.7–10.2)
CHLORIDE: 101 mmol/L (ref 96–106)
CO2: 21 mmol/L (ref 20–29)
CREATININE: 1.12 mg/dL — ABNORMAL HIGH (ref 0.57–1.00)
GFR MDRD AF AMER: 62 mL/min/{1.73_m2}
GFR MDRD NON AF AMER: 54 mL/min/{1.73_m2} — ABNORMAL LOW
GLUCOSE: 98 mg/dL (ref 65–99)
POTASSIUM: 5.1 mmol/L (ref 3.5–5.2)
SODIUM: 139 mmol/L (ref 134–144)

## 2018-05-24 LAB — PHOSPHORUS, SERUM: Lab: 3.4

## 2018-05-24 LAB — MAGNESIUM: Lab: 1.7

## 2018-05-24 LAB — CALCIUM: Lab: 9.9

## 2018-05-25 LAB — CMV QUANT: Lab: NEGATIVE

## 2018-05-25 LAB — CMV DNA, QUANTITATIVE, PCR: CMV QUANT: NEGATIVE [IU]/mL

## 2018-05-30 MED ORDER — SODIUM BICARBONATE 650 MG TABLET
ORAL_TABLET | Freq: Two times a day (BID) | ORAL | 11 refills | 0.00000 days | Status: CP
Start: 2018-05-30 — End: 2018-05-31

## 2018-05-31 LAB — CBC W/ DIFFERENTIAL
BANDED NEUTROPHILS ABSOLUTE COUNT: 0 10*3/uL (ref 0.0–0.1)
BASOPHILS ABSOLUTE COUNT: 0.1 10*3/uL (ref 0.0–0.2)
EOSINOPHILS ABSOLUTE COUNT: 0.2 10*3/uL (ref 0.0–0.4)
EOSINOPHILS RELATIVE PERCENT: 6 %
IMMATURE GRANULOCYTES: 1 %
LYMPHOCYTES ABSOLUTE COUNT: 0.3 10*3/uL — ABNORMAL LOW (ref 0.7–3.1)
LYMPHOCYTES RELATIVE PERCENT: 9 %
MEAN CORPUSCULAR HEMOGLOBIN CONC: 31.3 g/dL — ABNORMAL LOW (ref 31.5–35.7)
MEAN CORPUSCULAR HEMOGLOBIN: 27.8 pg (ref 26.6–33.0)
MEAN CORPUSCULAR VOLUME: 89 fL (ref 79–97)
MONOCYTES ABSOLUTE COUNT: 0.4 10*3/uL (ref 0.1–0.9)
MONOCYTES RELATIVE PERCENT: 12 %
NEUTROPHILS ABSOLUTE COUNT: 2.3 10*3/uL (ref 1.4–7.0)
NEUTROPHILS RELATIVE PERCENT: 70 %
PLATELET COUNT: 210 10*3/uL (ref 150–450)
RED BLOOD CELL COUNT: 4.14 x10E6/uL (ref 3.77–5.28)
RED CELL DISTRIBUTION WIDTH: 13 % (ref 12.3–15.4)
WHITE BLOOD CELL COUNT: 3.3 10*3/uL — ABNORMAL LOW (ref 3.4–10.8)

## 2018-05-31 LAB — BASIC METABOLIC PANEL
BLOOD UREA NITROGEN: 14 mg/dL (ref 6–24)
CALCIUM: 9.8 mg/dL (ref 8.7–10.2)
CHLORIDE: 105 mmol/L (ref 96–106)
CO2: 20 mmol/L (ref 20–29)
CREATININE: 1.2 mg/dL — ABNORMAL HIGH (ref 0.57–1.00)
GFR MDRD AF AMER: 57 mL/min/{1.73_m2} — ABNORMAL LOW
GLUCOSE: 130 mg/dL — ABNORMAL HIGH (ref 65–99)
POTASSIUM: 4.7 mmol/L (ref 3.5–5.2)
SODIUM: 141 mmol/L (ref 134–144)

## 2018-05-31 LAB — MAGNESIUM: Lab: 1.6

## 2018-05-31 LAB — PLATELET COUNT: Lab: 210

## 2018-05-31 LAB — TACROLIMUS BLOOD: Lab: 6.2

## 2018-05-31 LAB — PHOSPHORUS, SERUM: Lab: 3.7

## 2018-05-31 LAB — GFR MDRD AF AMER: Lab: 57 — ABNORMAL LOW

## 2018-05-31 MED ORDER — MAGNESIUM OXIDE-MAGNESIUM AMINO ACID CHELATE 133 MG TABLET
ORAL_TABLET | Freq: Two times a day (BID) | ORAL | 11 refills | 0 days | Status: CP
Start: 2018-05-31 — End: 2019-05-31

## 2018-05-31 MED ORDER — SODIUM BICARBONATE 650 MG TABLET
ORAL_TABLET | Freq: Two times a day (BID) | ORAL | 11 refills | 0 days | Status: CP
Start: 2018-05-31 — End: 2018-12-20

## 2018-06-02 LAB — CMV DNA, QUANTITATIVE, PCR: CMV QUANT: POSITIVE [IU]/mL

## 2018-06-02 LAB — CMV QUANT: Lab: POSITIVE

## 2018-06-02 NOTE — Unmapped (Signed)
[facility] Refill Coordination Note    Specialty Medication(s) to be Shipped:   Transplant: Myfortic 180mg  and Prograf 1mg     Other medication(s) to be shipped: Omperazole 40mg      Danielle Parker, DOB: 29-Dec-1960  Phone: [phone] (home)       All above HIPAA information was verified with patient.     Completed refill call assessment today to schedule patient's medication shipment from the [facility] [phone]).       Specialty medication(s) and dose(s) confirmed: Regimen is correct and unchanged.   Changes to medications: Danielle Parker reports no changes reported at this time.  Changes to insurance: No  Questions for the pharmacist: No    The patient will receive a drug information handout for each medication shipped and additional FDA Medication Guides as required.      DISEASE/MEDICATION-SPECIFIC INFORMATION        N/A    ADHERENCE     Medication Adherence    Patient reported X missed doses in the last month:  0                          MEDICARE PART B DOCUMENTATION     Myfortic 180mg : Patient has 8 days worth of tablets on hand.  Prograf 1mg : Patient has 8 days worth of  capsules on hand.    SHIPPING     Shipping address confirmed in Epic.     Delivery Scheduled: Yes, Expected medication delivery date: 06/07/18 via UPS or courier.     Medication will be delivered via UPS to the home address in Epic WAM.    Danielle Parker   [facility] Specialty Technician

## 2018-06-06 LAB — CBC W/ DIFFERENTIAL
BANDED NEUTROPHILS ABSOLUTE COUNT: 0 10*3/uL (ref 0.0–0.1)
BASOPHILS ABSOLUTE COUNT: 0.1 10*3/uL (ref 0.0–0.2)
BASOPHILS RELATIVE PERCENT: 2 %
EOSINOPHILS ABSOLUTE COUNT: 0.2 10*3/uL (ref 0.0–0.4)
EOSINOPHILS RELATIVE PERCENT: 6 %
HEMATOCRIT: 35.6 % (ref 34.0–46.6)
IMMATURE GRANULOCYTES: 1 %
LYMPHOCYTES ABSOLUTE COUNT: 0.4 10*3/uL — ABNORMAL LOW (ref 0.7–3.1)
LYMPHOCYTES RELATIVE PERCENT: 11 %
MEAN CORPUSCULAR HEMOGLOBIN CONC: 31.7 g/dL (ref 31.5–35.7)
MEAN CORPUSCULAR HEMOGLOBIN: 28.8 pg (ref 26.6–33.0)
MEAN CORPUSCULAR VOLUME: 91 fL (ref 79–97)
MONOCYTES ABSOLUTE COUNT: 0.4 10*3/uL (ref 0.1–0.9)
MONOCYTES RELATIVE PERCENT: 13 %
NEUTROPHILS ABSOLUTE COUNT: 2.3 10*3/uL (ref 1.4–7.0)
PLATELET COUNT: 214 10*3/uL (ref 150–450)
RED CELL DISTRIBUTION WIDTH: 13.2 % (ref 12.3–15.4)
WHITE BLOOD CELL COUNT: 3.4 10*3/uL (ref 3.4–10.8)

## 2018-06-06 LAB — RED CELL DISTRIBUTION WIDTH: Lab: 13.2

## 2018-06-06 MED FILL — MYFORTIC 180 MG TABLET,DELAYED RELEASE: 30 days supply | Qty: 180 | Fill #2 | Status: AC

## 2018-06-06 MED FILL — PROGRAF 1 MG CAPSULE: 30 days supply | Qty: 300 | Fill #2

## 2018-06-06 MED FILL — MYFORTIC 180 MG TABLET,DELAYED RELEASE: 30 days supply | Qty: 180 | Fill #2

## 2018-06-06 MED FILL — PROGRAF 1 MG CAPSULE: 30 days supply | Qty: 300 | Fill #2 | Status: AC

## 2018-06-07 LAB — BASIC METABOLIC PANEL
BUN / CREAT RATIO: 15 (ref 9–23)
CALCIUM: 9.5 mg/dL (ref 8.7–10.2)
CHLORIDE: 106 mmol/L (ref 96–106)
CO2: 20 mmol/L (ref 20–29)
CREATININE: 1.05 mg/dL — ABNORMAL HIGH (ref 0.57–1.00)
GFR MDRD NON AF AMER: 58 mL/min/{1.73_m2} — ABNORMAL LOW
GLUCOSE: 105 mg/dL — ABNORMAL HIGH (ref 65–99)
POTASSIUM: 4.2 mmol/L (ref 3.5–5.2)
SODIUM: 141 mmol/L (ref 134–144)

## 2018-06-07 LAB — TACROLIMUS LEVEL: TACROLIMUS BLOOD: 7.3 ng/mL (ref 2.0–20.0)

## 2018-06-07 LAB — TACROLIMUS BLOOD: Lab: 7.3

## 2018-06-07 LAB — PHOSPHORUS, SERUM: Lab: 3.6

## 2018-06-07 LAB — CALCIUM: Lab: 9.5

## 2018-06-07 LAB — MAGNESIUM: Lab: 1.5 — ABNORMAL LOW

## 2018-06-07 MED FILL — OMEPRAZOLE 40 MG CAPSULE,DELAYED RELEASE: 30 days supply | Qty: 60 | Fill #0 | Status: AC

## 2018-06-08 LAB — CMV DNA, QUANTITATIVE, PCR

## 2018-06-08 LAB — LOG10 CMV QN DNA PL

## 2018-06-13 LAB — CBC W/ DIFFERENTIAL
BANDED NEUTROPHILS ABSOLUTE COUNT: 0 10*3/uL (ref 0.0–0.1)
BASOPHILS ABSOLUTE COUNT: 0 10*3/uL (ref 0.0–0.2)
BASOPHILS RELATIVE PERCENT: 1 %
EOSINOPHILS ABSOLUTE COUNT: 0.2 10*3/uL (ref 0.0–0.4)
EOSINOPHILS RELATIVE PERCENT: 6 %
HEMATOCRIT: 35 % (ref 34.0–46.6)
IMMATURE GRANULOCYTES: 0 %
LYMPHOCYTES ABSOLUTE COUNT: 0.3 10*3/uL — ABNORMAL LOW (ref 0.7–3.1)
LYMPHOCYTES RELATIVE PERCENT: 12 %
MEAN CORPUSCULAR HEMOGLOBIN CONC: 32.3 g/dL (ref 31.5–35.7)
MEAN CORPUSCULAR HEMOGLOBIN: 28.6 pg (ref 26.6–33.0)
MEAN CORPUSCULAR VOLUME: 89 fL (ref 79–97)
MONOCYTES ABSOLUTE COUNT: 0.5 10*3/uL (ref 0.1–0.9)
MONOCYTES RELATIVE PERCENT: 17 %
NEUTROPHILS ABSOLUTE COUNT: 1.8 10*3/uL (ref 1.4–7.0)
NEUTROPHILS RELATIVE PERCENT: 64 %
PLATELET COUNT: 196 10*3/uL (ref 150–450)
RED BLOOD CELL COUNT: 3.95 x10E6/uL (ref 3.77–5.28)
RED CELL DISTRIBUTION WIDTH: 13.2 % (ref 12.3–15.4)
WHITE BLOOD CELL COUNT: 2.8 10*3/uL — ABNORMAL LOW (ref 3.4–10.8)

## 2018-06-13 LAB — HEMOGLOBIN: Lab: 11.3

## 2018-06-14 LAB — BASIC METABOLIC PANEL
BLOOD UREA NITROGEN: 18 mg/dL (ref 6–24)
BUN / CREAT RATIO: 16 (ref 9–23)
CALCIUM: 9.7 mg/dL (ref 8.7–10.2)
CO2: 19 mmol/L — ABNORMAL LOW (ref 20–29)
CREATININE: 1.1 mg/dL — ABNORMAL HIGH (ref 0.57–1.00)
GFR MDRD AF AMER: 64 mL/min/{1.73_m2}
GFR MDRD NON AF AMER: 55 mL/min/{1.73_m2} — ABNORMAL LOW
POTASSIUM: 4.6 mmol/L (ref 3.5–5.2)
SODIUM: 139 mmol/L (ref 134–144)

## 2018-06-14 LAB — CHLORIDE: Lab: 105

## 2018-06-14 LAB — PHOSPHORUS, SERUM: Lab: 3.4

## 2018-06-14 LAB — MAGNESIUM: Lab: 1.4 — ABNORMAL LOW

## 2018-06-15 LAB — CMV QUANT: Lab: POSITIVE

## 2018-06-15 LAB — TACROLIMUS BLOOD: Lab: 9

## 2018-06-16 MED FILL — GABAPENTIN 300 MG CAPSULE: ORAL | 30 days supply | Qty: 60 | Fill #2

## 2018-06-16 MED FILL — SULFAMETHOXAZOLE 400 MG-TRIMETHOPRIM 80 MG TABLET: ORAL | 28 days supply | Qty: 12 | Fill #3

## 2018-06-16 MED FILL — AMLODIPINE 10 MG TABLET: 30 days supply | Qty: 30 | Fill #3 | Status: AC

## 2018-06-16 MED FILL — AMLODIPINE 10 MG TABLET: 30 days supply | Qty: 30 | Fill #3

## 2018-06-16 MED FILL — SULFAMETHOXAZOLE 400 MG-TRIMETHOPRIM 80 MG TABLET: 28 days supply | Qty: 12 | Fill #3 | Status: AC

## 2018-06-16 MED FILL — GABAPENTIN 300 MG CAPSULE: 30 days supply | Qty: 60 | Fill #2 | Status: AC

## 2018-06-17 MED ORDER — VENTOLIN HFA 90 MCG/ACTUATION AEROSOL INHALER
1 refills | 0 days | Status: CP
Start: 2018-06-17 — End: 2019-06-17
  Filled 2018-06-20: qty 18, 100d supply, fill #0

## 2018-06-20 ENCOUNTER — Encounter: Payer: Self-pay | Admitting: Podiatry

## 2018-06-20 ENCOUNTER — Ambulatory Visit (INDEPENDENT_AMBULATORY_CARE_PROVIDER_SITE_OTHER): Payer: Medicare Other | Admitting: Podiatry

## 2018-06-20 VITALS — BP 127/67 | HR 60

## 2018-06-20 DIAGNOSIS — M2042 Other hammer toe(s) (acquired), left foot: Secondary | ICD-10-CM

## 2018-06-20 DIAGNOSIS — M2011 Hallux valgus (acquired), right foot: Secondary | ICD-10-CM | POA: Diagnosis not present

## 2018-06-20 DIAGNOSIS — M2012 Hallux valgus (acquired), left foot: Secondary | ICD-10-CM | POA: Diagnosis not present

## 2018-06-20 DIAGNOSIS — M2041 Other hammer toe(s) (acquired), right foot: Secondary | ICD-10-CM

## 2018-06-20 DIAGNOSIS — E1142 Type 2 diabetes mellitus with diabetic polyneuropathy: Secondary | ICD-10-CM

## 2018-06-20 DIAGNOSIS — B353 Tinea pedis: Secondary | ICD-10-CM | POA: Diagnosis not present

## 2018-06-20 LAB — MONOCYTES ABSOLUTE COUNT: Lab: 0.6

## 2018-06-20 LAB — CBC W/ DIFFERENTIAL
BANDED NEUTROPHILS ABSOLUTE COUNT: 0 10*3/uL (ref 0.0–0.1)
BASOPHILS ABSOLUTE COUNT: 0 10*3/uL (ref 0.0–0.2)
BASOPHILS RELATIVE PERCENT: 1 %
EOSINOPHILS ABSOLUTE COUNT: 0.2 10*3/uL (ref 0.0–0.4)
EOSINOPHILS RELATIVE PERCENT: 6 %
HEMATOCRIT: 36.4 % (ref 34.0–46.6)
IMMATURE GRANULOCYTES: 0 %
LYMPHOCYTES ABSOLUTE COUNT: 0.4 10*3/uL — ABNORMAL LOW (ref 0.7–3.1)
LYMPHOCYTES RELATIVE PERCENT: 12 %
MEAN CORPUSCULAR HEMOGLOBIN CONC: 32.7 g/dL (ref 31.5–35.7)
MEAN CORPUSCULAR VOLUME: 88 fL (ref 79–97)
MONOCYTES ABSOLUTE COUNT: 0.6 10*3/uL (ref 0.1–0.9)
MONOCYTES RELATIVE PERCENT: 17 %
NEUTROPHILS ABSOLUTE COUNT: 2.1 10*3/uL (ref 1.4–7.0)
NEUTROPHILS RELATIVE PERCENT: 64 %
PLATELET COUNT: 186 10*3/uL (ref 150–450)
RED BLOOD CELL COUNT: 4.15 x10E6/uL (ref 3.77–5.28)
RED CELL DISTRIBUTION WIDTH: 13.3 % (ref 12.3–15.4)
WHITE BLOOD CELL COUNT: 3.3 10*3/uL — ABNORMAL LOW (ref 3.4–10.8)

## 2018-06-20 MED FILL — VENTOLIN HFA 90 MCG/ACTUATION AEROSOL INHALER: 100 days supply | Qty: 18 | Fill #0 | Status: AC

## 2018-06-20 NOTE — Unmapped (Signed)
Error

## 2018-06-20 NOTE — Progress Notes (Signed)
This patient presents the office for an evaluation and treatment of her diabetic feet.  She says that she is diabetic and recently has become a transplant kidney patient.  She says she experiences pain and discomfort in her big toe joint left foot when she wears high heels.  She also is concerned about the white discoloration noted on the tips of all of her toenails both feet.  She states she is not experiencing any pain or discomfort to the toes both feet.  She is a diabetic with neuropathy and is treated with gabapentin.  She presents the office today for an evaluation and treatment of her diabetic feet  Vascular  Dorsalis pedis and posterior tibial pulses are palpable  B/L.  Capillary return  WNL.  Temperature gradient is  WNL.  Skin turgor  WNL  Sensorium  Senn Weinstein monofilament wire  WNL. Normal tactile sensation.  Nail Exam  Patient has normal nails with no evidence of bacterial .  Patient has white nails noted distally.  No drainage or pain noted.  Orthopedic  Exam  Muscle tone and muscle strength  WNL.  No limitations of motion feet  B/L.  No crepitus or joint effusion noted.  Foot type is unremarkable and digits show no abnormalities.  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  Hammer toes  B/L  Skin  No open lesions.  Normal skin texture and turgor.  Onychomycosis  HAV  Diabetes with neuropathy.  Hammer toe  B/L  IE.  Diabetic foot exam performed.  No vascular or neurological pathology noted.  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  Patient to be appointed with Liliane Channel.  RTC prn for annual foot exam.   Gardiner Barefoot DPM

## 2018-06-21 ENCOUNTER — Encounter: Admit: 2018-06-21 | Discharge: 2018-06-21 | Payer: 59 | Attending: Nephrology | Primary: Nephrology

## 2018-06-21 ENCOUNTER — Ambulatory Visit: Admit: 2018-06-21 | Discharge: 2018-06-21 | Payer: 59

## 2018-06-21 DIAGNOSIS — Z940 Kidney transplant status: Principal | ICD-10-CM

## 2018-06-21 DIAGNOSIS — Z79899 Other long term (current) drug therapy: Secondary | ICD-10-CM

## 2018-06-21 LAB — CBC W/ AUTO DIFF
BASOPHILS RELATIVE PERCENT: 1.2 %
EOSINOPHILS RELATIVE PERCENT: 4.2 %
HEMATOCRIT: 42.5 % (ref 36.0–46.0)
HEMOGLOBIN: 12.8 g/dL (ref 12.0–16.0)
LARGE UNSTAINED CELLS: 6 % — ABNORMAL HIGH (ref 0–4)
LYMPHOCYTES ABSOLUTE COUNT: 0.3 10*9/L — ABNORMAL LOW (ref 1.5–5.0)
LYMPHOCYTES RELATIVE PERCENT: 7 %
MEAN CORPUSCULAR HEMOGLOBIN CONC: 30.2 g/dL — ABNORMAL LOW (ref 31.0–37.0)
MEAN CORPUSCULAR HEMOGLOBIN: 28.5 pg (ref 26.0–34.0)
MEAN CORPUSCULAR VOLUME: 94.5 fL (ref 80.0–100.0)
MONOCYTES ABSOLUTE COUNT: 0.4 10*9/L (ref 0.2–0.8)
MONOCYTES RELATIVE PERCENT: 9.5 %
NEUTROPHILS ABSOLUTE COUNT: 2.7 10*9/L (ref 2.0–7.5)
NEUTROPHILS RELATIVE PERCENT: 71.8 %
PLATELET COUNT: 176 10*9/L (ref 150–440)
RED BLOOD CELL COUNT: 4.49 10*12/L (ref 4.00–5.20)
RED CELL DISTRIBUTION WIDTH: 14.8 % (ref 12.0–15.0)
WBC ADJUSTED: 3.8 10*9/L — ABNORMAL LOW (ref 4.5–11.0)

## 2018-06-21 LAB — COMPREHENSIVE METABOLIC PANEL
ALBUMIN: 4.5 g/dL (ref 3.5–5.0)
ALT (SGPT): 23 U/L (ref <35)
ANION GAP: 13 mmol/L (ref 7–15)
AST (SGOT): 22 U/L (ref 14–38)
BILIRUBIN TOTAL: 0.4 mg/dL (ref 0.0–1.2)
BLOOD UREA NITROGEN: 15 mg/dL (ref 7–21)
BUN / CREAT RATIO: 14
CALCIUM: 10.3 mg/dL — ABNORMAL HIGH (ref 8.5–10.2)
CHLORIDE: 104 mmol/L (ref 98–107)
CO2: 23 mmol/L (ref 22.0–30.0)
CREATININE: 1.1 mg/dL — ABNORMAL HIGH (ref 0.60–1.00)
EGFR CKD-EPI AA FEMALE: 64 mL/min/{1.73_m2} (ref >=60)
EGFR CKD-EPI NON-AA FEMALE: 55 mL/min/{1.73_m2} — ABNORMAL LOW (ref >=60)
GLUCOSE RANDOM: 115 mg/dL (ref 65–179)
POTASSIUM: 4.4 mmol/L (ref 3.5–5.0)
SODIUM: 140 mmol/L (ref 135–145)

## 2018-06-21 LAB — WBC ADJUSTED: Lab: 3.8 — ABNORMAL LOW

## 2018-06-21 LAB — ALBUMIN: Albumin:MCnc:Pt:Ser/Plas:Qn:: 4.5

## 2018-06-21 LAB — POTASSIUM: Lab: 4.3

## 2018-06-21 LAB — BASIC METABOLIC PANEL
BLOOD UREA NITROGEN: 18 mg/dL (ref 6–24)
BUN / CREAT RATIO: 15 (ref 9–23)
CALCIUM: 9.8 mg/dL (ref 8.7–10.2)
CO2: 20 mmol/L (ref 20–29)
CREATININE: 1.22 mg/dL — ABNORMAL HIGH (ref 0.57–1.00)
GFR MDRD NON AF AMER: 49 mL/min/{1.73_m2} — ABNORMAL LOW
GLUCOSE: 86 mg/dL (ref 65–99)
POTASSIUM: 4.3 mmol/L (ref 3.5–5.2)
SODIUM: 140 mmol/L (ref 134–144)

## 2018-06-21 LAB — URINALYSIS
BILIRUBIN UA: NEGATIVE
BLOOD UA: NEGATIVE
GLUCOSE UA: NEGATIVE
KETONES UA: NEGATIVE
LEUKOCYTE ESTERASE UA: NEGATIVE
NITRITE UA: NEGATIVE
PH UA: 5.5 (ref 5.0–9.0)
PROTEIN UA: NEGATIVE
RBC UA: <1 /HPF (ref <=4)
SPECIFIC GRAVITY UA: 1.006 (ref 1.003–1.030)
SQUAMOUS EPITHELIAL: 1 /HPF (ref 0–5)
UROBILINOGEN UA: 0.2
WBC UA: 3 /HPF (ref 0–5)

## 2018-06-21 LAB — PROTEIN / CREATININE RATIO, URINE
CREATININE, URINE: 30.6 mg/dL
PROTEIN/CREAT RATIO, URINE: 0.173

## 2018-06-21 LAB — PHOSPHORUS: Phosphate:MCnc:Pt:Ser/Plas:Qn:: 3.5

## 2018-06-21 LAB — BURR CELLS

## 2018-06-21 LAB — CREATININE, URINE: Lab: 30.6

## 2018-06-21 LAB — MAGNESIUM
Lab: 1.4 — ABNORMAL LOW
Magnesium:MCnc:Pt:Ser/Plas:Qn:: 1.5 — ABNORMAL LOW

## 2018-06-21 LAB — BLOOD UA: Lab: NEGATIVE

## 2018-06-21 LAB — TACROLIMUS, TROUGH: Lab: 6.4

## 2018-06-21 LAB — PHOSPHORUS, SERUM: Lab: 3.7

## 2018-06-21 LAB — SLIDE REVIEW

## 2018-06-21 MED ORDER — PREGABALIN 75 MG CAPSULE
ORAL_CAPSULE | Freq: Two times a day (BID) | ORAL | 2 refills | 0 days | Status: CP
Start: 2018-06-21 — End: 2019-03-16
  Filled 2018-06-21: qty 60, 30d supply, fill #0

## 2018-06-21 MED FILL — PREGABALIN 75 MG CAPSULE: 30 days supply | Qty: 60 | Fill #0 | Status: AC

## 2018-06-21 NOTE — Unmapped (Signed)
[facility]   ACUTE/CHRONIC TRANSPLANT FOLLOW UP     PCP: Marton Redwood, MD     Date of Visit at Transplant clinic: 06/21/2018     Graft Status: stable.    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 She was on dialysis for 9 years prior to transplant. Denies family history of stroke or SAH. No history of liver disease.     Creatinine - 1.10 on 06/21/18.   Urine analysis: negative protein/ 3WBC.  Urine protein/creatinine ratio: 0.173  DSA: negative.    Last CMV checked: 57 - (06/21/18), Repeat - if rising will decrease Myfortic or add valcyte.  Last BKV checked: pending.    CMV: D-/R+  Prophylaxis-  Bactrim SS MWF x 6 months (end 07/10/18)    2) Immunosuppression: Prograf 5 mg BID / Myfortic 540 BID  - Prograf level  6.4  and Last dose of Prograf: 9 PM  - Goal of 12 hour Prograf trough: 7-9  - Changes in Immunosuppression - none as last prograf has been ok.  - Medications side effects: No     3) HTN - Controlled and has consistently been weaned off of previous medication, most recently losartan.  Goal for B.P - <130/80, at goal on metoprolol tartrate 75mg  BID  Changes in B.P medications - none, adjustment per cardiologist Dr. Bing Neighbors at [facility]. Likely will soon be discharged from heart failure clinic.   - May decrease Norvasc at next visit     4) Anemia - stable   Goal for Hemoglobin > 13.0, has been stable 11.2, continue to monitor    5) MBD - Calcium/Phosphorus - WNL.  PTH - not yet checked   Last Dexa scan - not checked     6) Electrolytes: WNL    7) Immunizations:   Influenza (inactivated only): received locally in September  Pneumococcal vaccination (inactivated only): will ask patient.    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  Colonoscopy - 2017 with 10 year follow up recommended     9) COPD - Encourage using albuterol and Symbicort as prescribed. Quit smoking in 2017, previously smoked approximately 1/2 pack per day for greater than 30 years with previous frequent exacerbations, improved with smoking cessation.     10) Tenderness Along Incision: Likely due to dissolving stitch or scar tissue. No pain and tenderness at the graft itself.  Continue to monitor    11) Arthritis BLE  Encourage to discuss with PCP    12) Nerve Pain RUE.  Stop Gabapentin 300 mg BID due to nausea.   Start Lyrica 75 mg QD, if tolerating increase to BID. If Lyrica does not work, may try Cymbalta.    13) Migraines: Atypical description of frontal band tightness followed by occipital tension and neck pain, + light and sound sensitivities. Previously treated in pain clinic with Percocet and injections, she is not sure of what.  Work up with XR cervical spine - not done yet.  Recommend discussing in detail at next PCP visit with repeat assessment by neurology for headache management.     Follow up: 8 weeks.      Patient was seen and discussed with Dr. Nestor Lewandowsky who is in agreement with the plan.  History of Presenting Illness:   Since patient's last visit in the transplant clinic - patient has been doing well in terms of transplant, taking transplant medications regularly, no episodes of rejection and no side effects of medications.    Her primary complaint is tenderness along her incision site. She denies pain in her allograft. She denies trouble ambulating. She makes a normal amount of urine. She denies any fever, chills, or recent illness.    She has history of hypertension almost all her life. She had prior 2 week event monitor May 2019 for episodes of palpitations. Results showed normal sinus rhythm with average HR of 69. BP recently has been excellently controlled and she will soon be discharged from the heart failure clinic at [facility]. She is currently only taking metoprolol 75mg  BID. She was taken off of losartan earlier this summer by a cardiologist.    She was previously followed by [facility] for chronic pain to left arm, specifically around the arm. 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 She continued to experience significant pain/cramping during dialysis. Pain was managed with percocet and gabapentin. She was last seen by pain clinic on 02/11/18 and percocet was refilled. However, per note this was patient's last refill as they did not feel she required pain meds now she is off dialysis and will not need to have dialysis access.     She continues to have migraine headaches, roughly once weekly and last for a couple days a night. She describes it as starting as a bandlike sensation around her forehead that then goes posteriorly as a throbbing constant pain. She also endorses posterior neck pain and stiffness that is more persistent even when she does not have a headache. She sometimes takes a Tylenol at the beginning which slows down the intensity but it eventually wears off and she is back to having severe pain. She also has associated light and sound sensitivity. Her headache usually subsides after a couple of days. Many years ago she was told she had migraines but no longer follows regularly with neurology.    Patient presents today feeling overall well. She reports some congestion and SOB when it is cold, denies a cough. She uses a sybicort inhaler, which she says helps. Patient reports some anxiety about the future, she specifically wonders if something bad may happen. She reports some joint pain and muscle pain in her neck, back, and knees. Home BPs run at 110-120 systolic. Never been more than 130. Lowest at home around 108-107. Patient reports an extremely good appetite. Denies chest pain, tigtness, enema, tremors. She reports some travel issues. She would like to be seen by a [facility] nephrologist in [place], and be seen here every 6 months.      Last dose of Prograf at:  9 PM   Diabetes: No:   HTN: Yes    Controlled:Yes    CAD/Heartfailure: Yes - catheterization 03/17/2016 showing on coronary disease     Last Echo/Ejection fraction: Echo 09/21/17 / EF 60-65%     Cardiologist: Dr. Julien Nordmann   Adherence      With Medication: yes     With Follow up: yes      Functional Status: Independent.    Review of Systems:     Fever or chills: negative   Sore throat: negative   Fatigue/malaise: negative   Weight loss or gain: negative   New skin rash/lump or bump: negative   Problems  with teeth/gums: negative   Chest pain: negative   Cough or shortness of breath: negative   Swelling: negative   Belly pain/heartburn/nausea/vomiting or diarrhea: positive, pain along RLQ incision.   Pain or bleeding when urinating: negative   Twitching/numbness or weakness: negative     Physical Exam:     BP 109/69 (BP Site: L Arm, BP Position: Sitting, BP Cuff Size: Medium)  - Pulse 70  - Temp 36.2 ??C (97.2 ??F) (Temporal)  - Ht 160 cm (5' 2.99)  - Wt 84.4 kg (186 lb)  - BMI 32.96 kg/m??     Nursing note and vitals reviewed.   Constitutional: Oriented to person, place, and time. Appears well-developed and well-nourished. No distress.   HENT:   Head: Normocephalic and atraumatic.   Eyes: Right eye exhibits no discharge. Left eye exhibits no discharge. No scleral icterus.   Neck: Normal range of motion. Neck supple.   Cardiovascular: Normal rate and regular rhythm. Exam reveals no gallop and no friction rub. No murmur heard. Fistula bruit appreciated.  Pulmonary/Chest: Effort normal and breath sounds normal. No respiratory distress.   Abdominal:  Soft. Palpable <1cm nodule beneath lower half of incision, moderately tender to palpation. Focal small area of tenderness along upper half of incision. No graft tenderness, swelling or erythema.     Musculoskeletal: Normal range of motion. No edema and no tenderness.   Neurological: Alert and oriented to person, place, and time. Skin: Skin is warm and dry. No rash noted. Not diaphoretic. No erythema. No pallor.   Psychiatric: Normal mood and affect. Behavior is normal. Judgment and thought content normal.     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   Induction: [place]    Maintenance IS at the time of transplant: methylprednisolone taper which ended on 01/11/18, Cellcept 750mg  BID, and Prograf titrated up to 8mg  BID by day of discharge.   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 Patient treated on 7 day course of PO ciprofoxacin, completed on 01/16/18.     Allergies:   Allergies   Allergen Reactions   ??? Buprenorphine Hcl Nausea And Vomiting   ??? Codeine Nausea And Vomiting   ??? Hydrocodone-Acetaminophen Nausea And Vomiting   ??? Morphine Nausea And Vomiting     Halllucinations        Current Medications:   Current Outpatient Medications   Medication Sig Dispense Refill   ??? acetaminophen (TYLENOL) 500 MG tablet TAKE 1 TO 2 TABLETS (500-1000MG ) BY MOUTH THREE TIMES DAILY AS NEEDED 100 each 99   ??? amLODIPine (NORVASC) 10 MG tablet TAKE ONE TABLET DAILY **USE ONLY AS DIRECTED** 30 tablet 10   ??? aspirin (ECOTRIN) 81 MG tablet Take 81 mg by mouth daily.     ??? budesonide-formoterol (SYMBICORT) 80-4.5 mcg/actuation inhaler INHALE 2 PUFFS TWICE DAILY 10.2 g 11   ??? docusate sodium (COLACE) 100 MG capsule TAKE 1 CAPSULE BY  MOUTH TWICE DAILY AS NEEDED FOR CONSTIPATION. 60 capsule 99   ??? magnesium oxide-Mg AA chelate 133 mg Tab Take 133 mg by mouth two (2) times a day. 60 tablet 11   ??? metoprolol tartrate (LOPRESSOR) 25 MG tablet Take 3 tablets (75 mg total) by mouth Two (2) times a day. 180 tablet 11   ??? mycophenolate (MYFORTIC) 180 MG EC tablet TAKE 3 TABLETS (540MG ) BY MOUTH TWICE DAILY 180 tablet 10   ??? omeprazole (PRILOSEC) 40 MG capsule Take 1 capsule (40 mg total) by mouth two (2) times a day. 60 capsule 11   ??? OXYGEN-AIR DELIVERY SYSTEMS MISC Inhale 3 L continuous.     ??? pregabalin (LYRICA) 75 MG capsule Take 1 capsule (75 mg total) by mouth Two (2) times a day. 60 capsule 2   ??? PROGRAF 1 mg capsule Take 5 capsules (5mg ) by mouth every morning and 5 capsules (5mg ) every evening 300 capsule 11   ??? rosuvastatin (CRESTOR) 5 MG tablet TAKE 1 TABLET BY MOUTH ONCE DAILY 90 tablet 3   ??? sodium bicarbonate 650 mg tablet Take 1 tablet (648 mg total) by mouth Two (2) times a day. 60 tablet 11   ??? traMADol (ULTRAM) 50 mg tablet Take 1 tablet (50 mg total) by mouth every six (6) hours as needed for pain. Chronic arm pain from previous surgery, 30 days supply 60 tablet 2   ??? VENTOLIN HFA 90 mcg/actuation inhaler INHALE 2 PUFFS ONCE DAILY AS NEEDED FOR SHORTNESS OF BREATH 18 g 1     No current facility-administered medications for this visit.        Past Medical History:   Past Medical History:   Diagnosis Date   ??? Abnormal mammogram    ??? Asthma    ??? Chronic kidney disease    ??? COPD (chronic obstructive pulmonary disease) (CMS-HCC)    ??? GERD (gastroesophageal reflux disease)    ??? Hypertension    ??? Migraine    ??? Nephrotic syndrome         Laboratory studies:     Recent Results (from the past 170 hour(s))   Phosphorus Level    Collection Time: 06/20/18  8:04 AM   Result Value Ref Range    Phosphorus, Serum 3.7 2.5 - 4.5 mg/dL   Magnesium Level    Collection Time: 06/20/18  8:04 AM   Result Value Ref Range    Magnesium 1.4 (L) 1.6 - 2.3 mg/dL   CBC and differential    Collection Time: 06/20/18  8:04 AM   Result Value Ref Range    WBC 3.3 (L) 3.4 - 10.8 x10E3/uL    RBC 4.15 3.77 - 5.28 x10E6/uL    HGB 11.9 11.1 - 15.9 g/dL    HCT 16.1 09.6 - 04.5 %    MCV 88 79 - 97 fL MCH 28.7 26.6 - 33.0 pg    MCHC 32.7 31.5 - 35.7 g/dL    RDW 40.9 81.1 - 91.4 %    Platelet 186 150 - 450 x10E3/uL    Neutrophils % 64 Not Estab. %    Lymphocytes % 12 Not Estab. %    Monocytes % 17 Not Estab. %    Eosinophils % 6 Not Estab. %    Basophils % 1 Not Estab. %    Absolute Neutrophils 2.1 1.4 - 7.0 x10E3/uL    Absolute Lymphocytes 0.4 (L) 0.7 - 3.1 x10E3/uL    Absolute Monocytes  0.6 0.1 - 0.9 x10E3/uL  Absolute Eosinophils 0.2 0.0 - 0.4 x10E3/uL    Absolute Basophils  0.0 0.0 - 0.2 x10E3/uL    Immature Granulocytes 0 Not Estab. %    Bands Absolute 0.0 0.0 - 0.1 x10E3/uL   Basic Metabolic Panel    Collection Time: 06/20/18  8:04 AM   Result Value Ref Range    Glucose 86 65 - 99 mg/dL    BUN 18 6 - 24 mg/dL    Creatinine 1.61 (H) 0.57 - 1.00 mg/dL    GFR MDRD Non Af Amer 49 (L) >59 mL/min/1.73    GFR MDRD Af Amer 56 (L) >59 mL/min/1.73    BUN/Creatinine Ratio 15 9 - 23    Sodium 140 134 - 144 mmol/L    Potassium 4.3 3.5 - 5.2 mmol/L    Chloride 106 96 - 106 mmol/L    CO2 20 20 - 29 mmol/L    Calcium 9.8 8.7 - 10.2 mg/dL   Comprehensive Metabolic Panel    Collection Time: 06/21/18  9:00 AM   Result Value Ref Range    Sodium 140 135 - 145 mmol/L    Potassium 4.4 3.5 - 5.0 mmol/L    Chloride 104 98 - 107 mmol/L    CO2 23.0 22.0 - 30.0 mmol/L    BUN 15 7 - 21 mg/dL    Creatinine 0.96 (H) 0.60 - 1.00 mg/dL    BUN/Creatinine Ratio 14     EGFR CKD-EPI Non-African American, Female 55 (L) >=60 mL/min/1.80m2    EGFR CKD-EPI African American, Female 86 >=60 mL/min/1.55m2    Glucose 115 65 - 179 mg/dL    Calcium 04.5 (H) 8.5 - 10.2 mg/dL    Albumin 4.5 3.5 - 5.0 g/dL    Total Protein 8.4 (H) 6.5 - 8.3 g/dL    Total Bilirubin 0.4 0.0 - 1.2 mg/dL    AST 22 14 - 38 U/L    ALT 23 <35 U/L    Alkaline Phosphatase 125 38 - 126 U/L    Anion Gap 13 7 - 15 mmol/L   Magnesium Level    Collection Time: 06/21/18  9:00 AM   Result Value Ref Range    Magnesium 1.5 (L) 1.6 - 2.2 mg/dL   Phosphorus Level    Collection Time: 06/21/18  9:00 AM   Result Value Ref Range    Phosphorus 3.5 2.9 - 4.7 mg/dL   CBC w/ Differential    Collection Time: 06/21/18  9:00 AM   Result Value Ref Range    WBC 3.8 (L) 4.5 - 11.0 10*9/L    RBC 4.49 4.00 - 5.20 10*12/L    HGB 12.8 12.0 - 16.0 g/dL    HCT 40.9 81.1 - 91.4 %    MCV 94.5 80.0 - 100.0 fL    MCH 28.5 26.0 - 34.0 pg    MCHC 30.2 (L) 31.0 - 37.0 g/dL    RDW 78.2 95.6 - 21.3 %    MPV 9.1 7.0 - 10.0 fL    Platelet 176 150 - 440 10*9/L    Neutrophils % 71.8 %    Lymphocytes % 7.0 %    Monocytes % 9.5 %    Eosinophils % 4.2 %    Basophils % 1.2 %    Absolute Neutrophils 2.7 2.0 - 7.5 10*9/L    Absolute Lymphocytes 0.3 (L) 1.5 - 5.0 10*9/L    Absolute Monocytes 0.4 0.2 - 0.8 10*9/L    Absolute Eosinophils 0.2 0.0 - 0.4 10*9/L  Absolute Basophils 0.0 0.0 - 0.1 10*9/L    Large Unstained Cells 6 (H) 0 - 4 %    Macrocytosis Slight (A) Not Present    Hypochromasia Marked (A) Not Present     Scribe's Attestation: Leeroy Bock, MD obtained and performed the history, physical exam and medical decision making elements that were entered into the chart.  Signed by Rolan Lipa, Scribe, June 21, 2018 at 10:00 AM.    Documentation assistance provided by the Scribe. I was present during the time the encounter was recorded. The information recorded by the Scribe was done at my direction and has been reviewed and validated by me.

## 2018-06-22 LAB — CMV DNA, QUANTITATIVE, PCR: CMV QUANT: 57 [IU]/mL — ABNORMAL HIGH (ref <0)

## 2018-06-22 LAB — CMV COMMENT: Lab: 0

## 2018-06-22 LAB — TACROLIMUS BLOOD: Lab: 7.7

## 2018-06-22 LAB — LOG10 CMV QN DNA PL

## 2018-06-27 LAB — CBC W/ DIFFERENTIAL
BASOPHILS ABSOLUTE COUNT: 0 10*3/uL (ref 0.0–0.2)
BASOPHILS RELATIVE PERCENT: 1 %
EOSINOPHILS ABSOLUTE COUNT: 0.1 10*3/uL (ref 0.0–0.4)
EOSINOPHILS RELATIVE PERCENT: 5 %
HEMATOCRIT: 34.8 % (ref 34.0–46.6)
HEMOGLOBIN: 11.5 g/dL (ref 11.1–15.9)
IMMATURE GRANULOCYTES: 0 %
LYMPHOCYTES ABSOLUTE COUNT: 0.3 10*3/uL — ABNORMAL LOW (ref 0.7–3.1)
LYMPHOCYTES RELATIVE PERCENT: 12 %
MEAN CORPUSCULAR HEMOGLOBIN CONC: 33 g/dL (ref 31.5–35.7)
MEAN CORPUSCULAR HEMOGLOBIN: 28.8 pg (ref 26.6–33.0)
MEAN CORPUSCULAR VOLUME: 87 fL (ref 79–97)
MONOCYTES ABSOLUTE COUNT: 0.4 10*3/uL (ref 0.1–0.9)
MONOCYTES RELATIVE PERCENT: 15 %
NEUTROPHILS ABSOLUTE COUNT: 1.9 10*3/uL (ref 1.4–7.0)
NEUTROPHILS RELATIVE PERCENT: 67 %
PLATELET COUNT: 156 10*3/uL (ref 150–450)
RED BLOOD CELL COUNT: 4 x10E6/uL (ref 3.77–5.28)
RED CELL DISTRIBUTION WIDTH: 13.1 % (ref 12.3–15.4)

## 2018-06-27 LAB — LYMPHOCYTES ABSOLUTE COUNT: Lab: 0.3 — ABNORMAL LOW

## 2018-06-27 IMAGING — CR DG CHEST 2V
1 series · 2 of 2 positions shown · non-contrast
Comparison: Portable chest x-ray of 04/24/2016

CLINICAL DATA: Difficulty breathing last night, worse today,
history of end-stage renal disease and diabetes

EXAM:
CHEST  2 VIEW

[Series 1: w chest pa · 0.14mm/px · 2 of 2 slices shown]
[im 1/2]
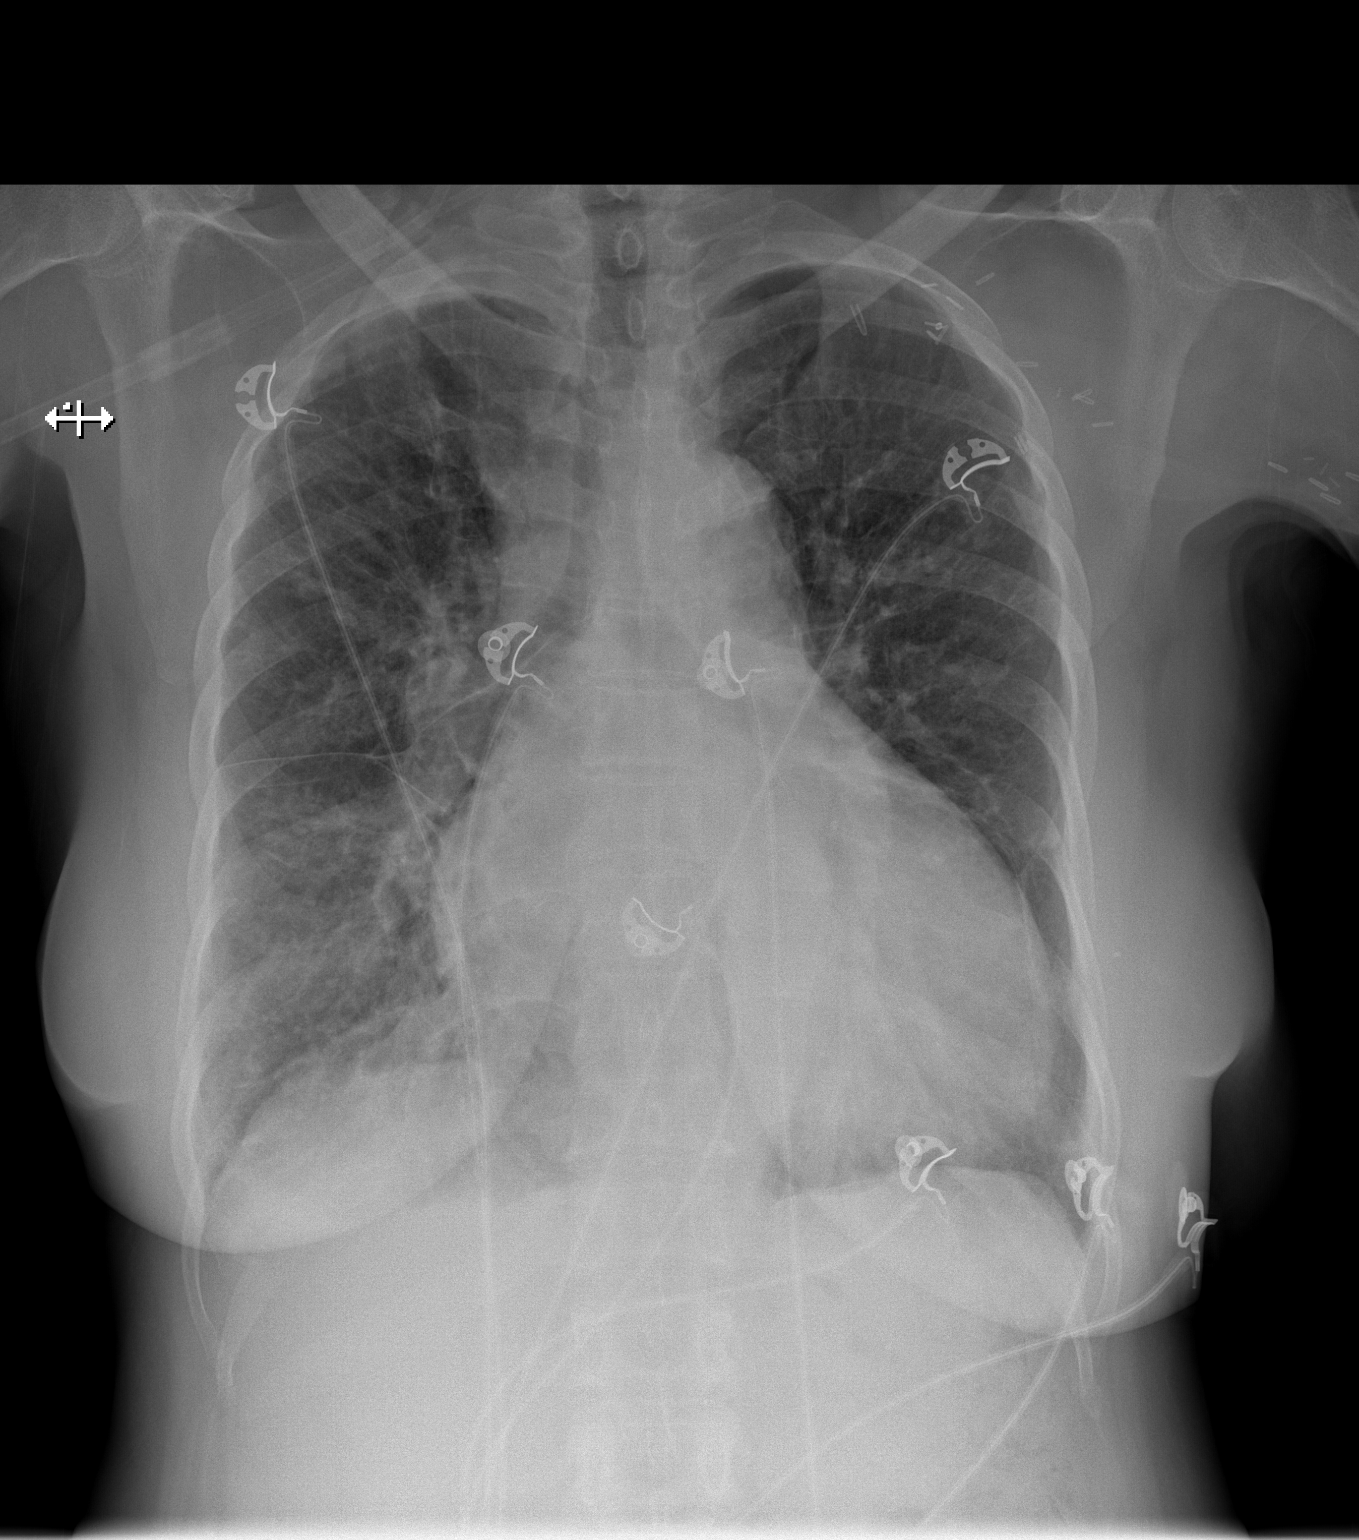
[im 2/2]
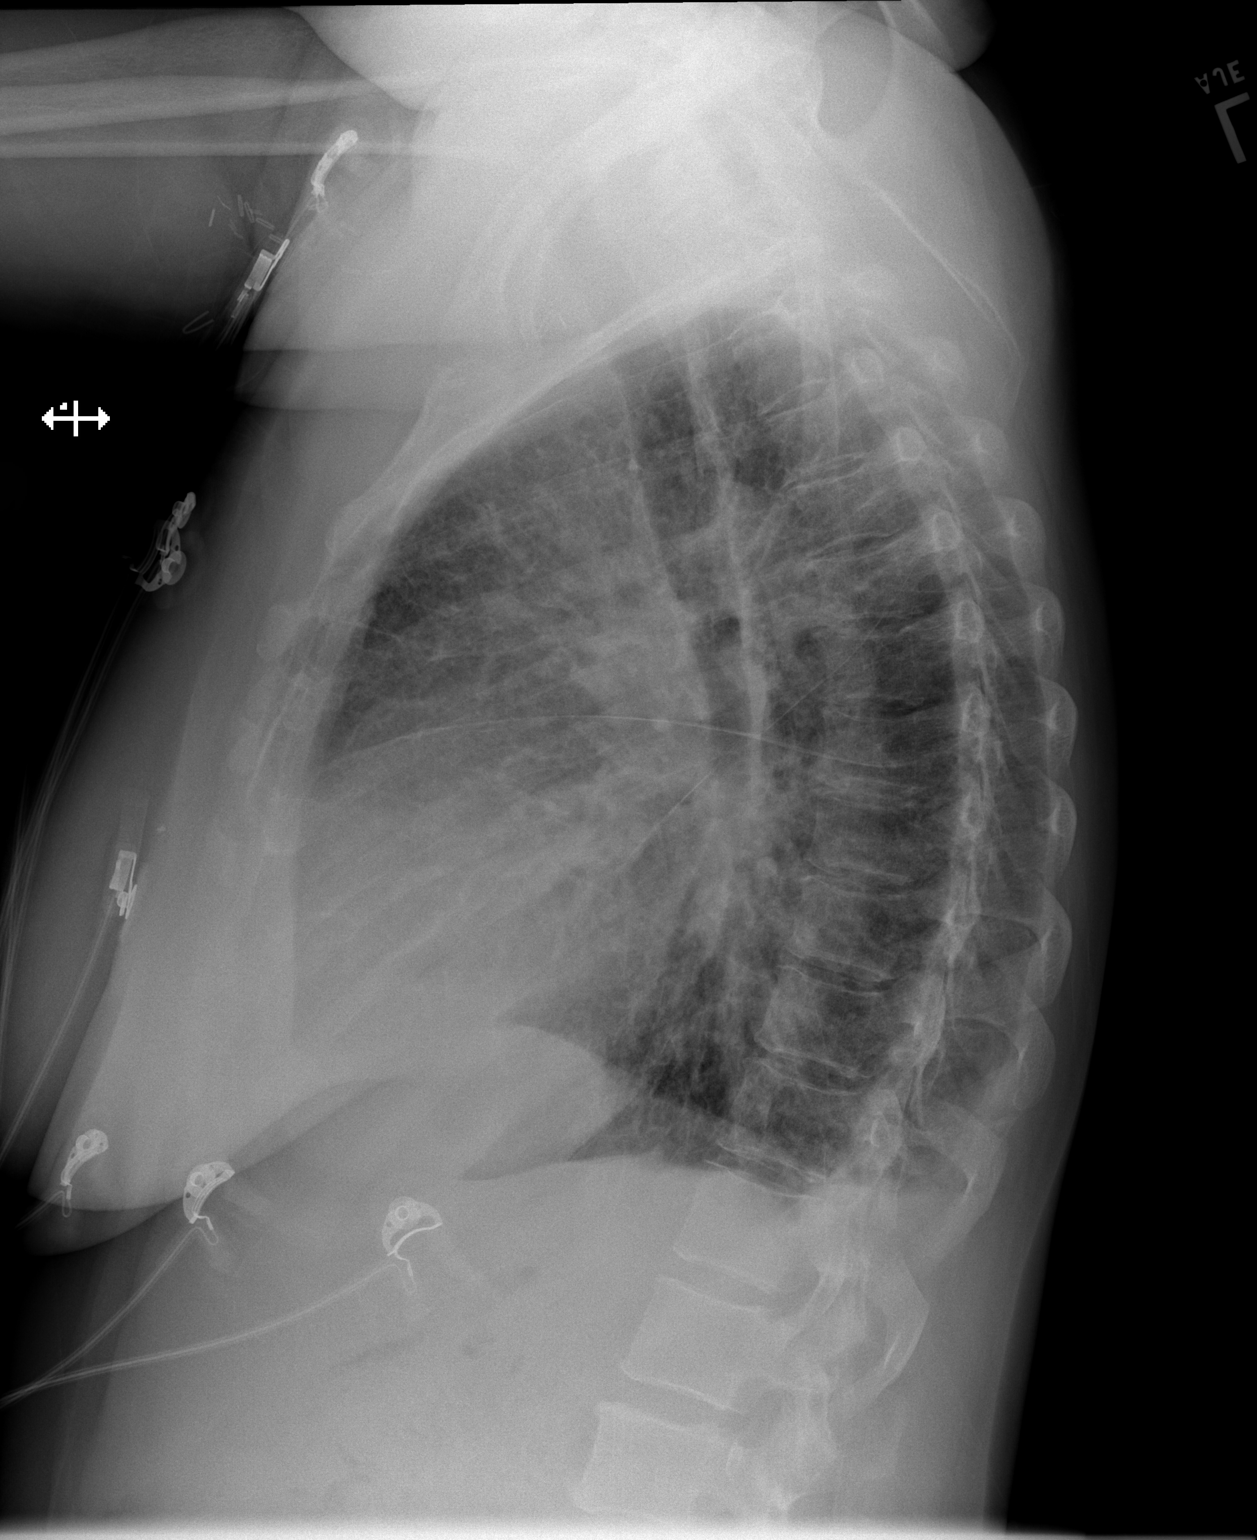

[2 of 2 positions shown; findings below may reference images not displayed]

FINDINGS: There is moderate cardiomegaly present. There is pulmonary vascular
congestion with perhaps tiny pleural effusions, consistent with
congestive heart failure. No focal pneumonia is seen. No bony
abnormality is seen.
IMPRESSION: CHF.

## 2018-06-27 MED FILL — DOK 100 MG CAPSULE: 30 days supply | Qty: 60 | Fill #3 | Status: AC

## 2018-06-27 MED FILL — METOPROLOL TARTRATE 25 MG TABLET: 30 days supply | Qty: 180 | Fill #3 | Status: AC

## 2018-06-27 MED FILL — METOPROLOL TARTRATE 25 MG TABLET: ORAL | 30 days supply | Qty: 180 | Fill #3

## 2018-06-27 MED FILL — DOK 100 MG CAPSULE: ORAL | 30 days supply | Qty: 60 | Fill #3

## 2018-06-27 NOTE — Unmapped (Signed)
[facility] Refill Coordination Note    Specialty Medication(s) to be Shipped:   Transplant: Myfortic 180mg  and Prograf 1mg   Other medication(s) to be shipped: Acetaminophen 500mg  & Omeprazole 40mg      Danielle Parker, DOB: 01-08-1961  Phone: [phone] (home)     All above HIPAA information was verified with patient.     Completed refill call assessment today to schedule patient's medication shipment from the [facility] [phone]).       Specialty medication(s) and dose(s) confirmed: Regimen is correct and unchanged.   Changes to medications: Addison reports starting the following medications: Lyrica 75mg  (1 capsule twice a day)  Changes to insurance: No  Questions for the pharmacist: No    The patient will receive a drug information handout for each medication shipped and additional FDA Medication Guides as required.      DISEASE/MEDICATION-SPECIFIC INFORMATION        N/A    ADHERENCE     Medication Adherence    Patient reported X missed doses in the last month:  0  Specialty Medication:  Myfortic 180mg  & Prograf 1mg   Patient is on additional specialty medications:  No  Patient is on more than two specialty medications:  No  Any gaps in refill history greater than 2 weeks in the last 3 months:  no  Demonstrates understanding of importance of adherence:  yes  Informant:  patient  Reliability of informant:  reliable      Adherence tools used:  patient uses a pill box to manage medications          Confirmed plan for next specialty medication refill:  delivery by pharmacy          Refill Coordination    Has the Patients' Contact Information Changed:  No  Is the Shipping Address Different:  No         MEDICARE PART B DOCUMENTATION     Myfortic 180mg : Patient has 9 days worth of tablets on hand.  Prograf 1mg : Patient has 9 days worth of capsules on hand.    SHIPPING     Shipping address confirmed in Epic.     Delivery Scheduled: Yes, Expected medication delivery date: 07/05/2018 via UPS or courier.     Medication will be delivered via UPS to the home address in Epic [place].    Alanzo Lamb P Allena Katz   [facility] Specialty Technician

## 2018-06-28 ENCOUNTER — Encounter: Payer: Self-pay | Admitting: Family

## 2018-06-28 ENCOUNTER — Ambulatory Visit: Payer: Medicare Other | Attending: Family | Admitting: Family

## 2018-06-28 VITALS — BP 110/70 | HR 60 | Resp 18 | Ht 63.0 in | Wt 189.4 lb

## 2018-06-28 DIAGNOSIS — Z992 Dependence on renal dialysis: Secondary | ICD-10-CM | POA: Insufficient documentation

## 2018-06-28 DIAGNOSIS — E1142 Type 2 diabetes mellitus with diabetic polyneuropathy: Secondary | ICD-10-CM | POA: Insufficient documentation

## 2018-06-28 DIAGNOSIS — E1122 Type 2 diabetes mellitus with diabetic chronic kidney disease: Secondary | ICD-10-CM | POA: Insufficient documentation

## 2018-06-28 DIAGNOSIS — J449 Chronic obstructive pulmonary disease, unspecified: Secondary | ICD-10-CM | POA: Diagnosis not present

## 2018-06-28 DIAGNOSIS — Z888 Allergy status to other drugs, medicaments and biological substances status: Secondary | ICD-10-CM | POA: Insufficient documentation

## 2018-06-28 DIAGNOSIS — N186 End stage renal disease: Secondary | ICD-10-CM | POA: Insufficient documentation

## 2018-06-28 DIAGNOSIS — I10 Essential (primary) hypertension: Secondary | ICD-10-CM

## 2018-06-28 DIAGNOSIS — Z885 Allergy status to narcotic agent status: Secondary | ICD-10-CM | POA: Insufficient documentation

## 2018-06-28 DIAGNOSIS — Z79899 Other long term (current) drug therapy: Secondary | ICD-10-CM | POA: Diagnosis not present

## 2018-06-28 DIAGNOSIS — I5032 Chronic diastolic (congestive) heart failure: Secondary | ICD-10-CM | POA: Diagnosis not present

## 2018-06-28 DIAGNOSIS — R0602 Shortness of breath: Secondary | ICD-10-CM | POA: Insufficient documentation

## 2018-06-28 DIAGNOSIS — Z87891 Personal history of nicotine dependence: Secondary | ICD-10-CM | POA: Insufficient documentation

## 2018-06-28 DIAGNOSIS — B20 Human immunodeficiency virus [HIV] disease: Secondary | ICD-10-CM | POA: Diagnosis not present

## 2018-06-28 DIAGNOSIS — Z7951 Long term (current) use of inhaled steroids: Secondary | ICD-10-CM | POA: Insufficient documentation

## 2018-06-28 DIAGNOSIS — Z940 Kidney transplant status: Secondary | ICD-10-CM | POA: Diagnosis not present

## 2018-06-28 DIAGNOSIS — Q613 Polycystic kidney, unspecified: Secondary | ICD-10-CM | POA: Diagnosis not present

## 2018-06-28 DIAGNOSIS — I132 Hypertensive heart and chronic kidney disease with heart failure and with stage 5 chronic kidney disease, or end stage renal disease: Secondary | ICD-10-CM | POA: Diagnosis not present

## 2018-06-28 DIAGNOSIS — Z7982 Long term (current) use of aspirin: Secondary | ICD-10-CM | POA: Insufficient documentation

## 2018-06-28 DIAGNOSIS — Z8249 Family history of ischemic heart disease and other diseases of the circulatory system: Secondary | ICD-10-CM | POA: Insufficient documentation

## 2018-06-28 LAB — BASIC METABOLIC PANEL
BLOOD UREA NITROGEN: 19 mg/dL (ref 6–24)
CALCIUM: 9.6 mg/dL (ref 8.7–10.2)
CHLORIDE: 103 mmol/L (ref 96–106)
CREATININE: 1.1 mg/dL — ABNORMAL HIGH (ref 0.57–1.00)
GFR MDRD AF AMER: 64 mL/min/{1.73_m2}
GFR MDRD NON AF AMER: 55 mL/min/{1.73_m2} — ABNORMAL LOW
GLUCOSE: 100 mg/dL — ABNORMAL HIGH (ref 65–99)
POTASSIUM: 4.5 mmol/L (ref 3.5–5.2)
SODIUM: 139 mmol/L (ref 134–144)

## 2018-06-28 LAB — MAGNESIUM: Lab: 1.5 — ABNORMAL LOW

## 2018-06-28 LAB — CALCIUM: Lab: 9.6

## 2018-06-28 LAB — PHOSPHORUS, SERUM: Lab: 3.4

## 2018-06-28 LAB — TACROLIMUS BLOOD: Lab: 9.2

## 2018-06-28 NOTE — Patient Instructions (Signed)
Continue weighing daily and call for an overnight weight gain of > 2 pounds or a weekly weight gain of >5 pounds. 

## 2018-06-28 NOTE — Progress Notes (Signed)
Subjective:    Patient ID: Jasmine Holloway, female    DOB: 08/16/1960, 57 y.o.   MRN: [id number]  HPI   Jasmine Holloway is a 57 y/o female with a history of asthma, DM, HTN, kidney disease (dialysis), COPD, previous tobacco use and chronic heart failure.   Echo report from 09/21/17 reviewed and showed an EF of 60-65%. Reviewed echo report on 11/10/16 which showed an EF of 50-55% along with mild AR/MR. EF was 55-60% May 2017. Cardiac catheterization was done 03/17/16 and showed normal coronary arteries.   Was in the ED 01/19/18 due to abdominal pain. This improved after GI cocktail and she was released. Was in the ED 01/18/18 due to abdominal pain where she was treated and released. Was in the ED 01/17/18 due to abdominal pain. Ultrasound was negative and she was released with antibiotics. Admitted 01/07/18 due to kidney transplant where she was discharged after 6 day.   She presents today for a follow-up visit with a chief complaint of minimal shortness of breath upon moderate exertion. She describes this as chronic in nature although occurs intermittently now. She has associated anxiety and gradual weight gain along with this. She says that she's been eating "way more sweets" than she used to. She denies any difficulty sleeping, abdominal distention, palpitations, pedal edema, chest pain, cough, fatigue or dizziness.   Past Medical History:  Diagnosis Date  . Asthma   . CHF (congestive heart failure) ([place])   . COPD (chronic obstructive pulmonary disease) ([place])   . Diabetes mellitus without complication ([place])   . Diastolic heart failure ([place])   . ESRD (end stage renal disease) on dialysis ([place])   . HIV (human immunodeficiency virus infection) ([place])   . Hypertension   . Neuropathy   . Polycystic kidney disease   . Renal insufficiency     Past Surgical History:  Procedure Laterality Date  . ABDOMINAL HYSTERECTOMY    . ABDOMINAL SURGERY    . ARTERIOVENOUS GRAFT PLACEMENT Right    x3 (R forearm currently  used for access)  . BREAST BIOPSY Left 12/03/2015   neg  . COLON SURGERY    . ESOPHAGOGASTRODUODENOSCOPY (EGD) WITH PROPOFOL N/A 02/04/2017   Procedure: ESOPHAGOGASTRODUODENOSCOPY (EGD) WITH PROPOFOL;  Surgeon: Jonathon Bellows, MD;  Location: [facility] ENDOSCOPY;  Service: Endoscopy;  Laterality: N/A;  . 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 PERIPHERAL VASCULAR CATHETERIZATION Right 08/04/2016   Procedure: A/V Shuntogram;  Surgeon: Algernon Huxley, MD;  Location: [place] [facility];  Service: Cardiovascular;  Laterality: Right;  . VEIN HARVEST      Family History  Problem Relation Age of Onset  . Hypertension Brother   . Heart failure Brother   . Hypertension Mother   . Heart disease Mother     Social History   Tobacco Use  . Smoking status: Former Smoker    Packs/day: 0.25    Last attempt to quit: 08/11/2015    Years since quitting: 2.8  . Smokeless tobacco: Never Used  Substance Use Topics  . Alcohol use: No    Alcohol/week: 0.0 standard drinks    Allergies  Allergen Reactions  . Morphine And Related Nausea And Vomiting  . Strawberry Extract   . Tramadol Nausea And Vomiting  . Venofer [Iron Sucrose]   . Buprenorphine Hcl Nausea And Vomiting  . Morphine Nausea And Vomiting    Halllucinations   Prior to Admission medications   Medication Sig Start Date End Date Taking? Authorizing Provider  acetaminophen (TYLENOL) 325  MG tablet Take 2 tablets (650 mg total) by mouth every 6 (six) hours as needed for mild pain or headache. 03/18/16  Yes Gouru, Aruna, MD  amLODipine (NORVASC) 5 MG tablet Take 5 mg daily by mouth.  08/31/14  Yes [provider]  aspirin 81 MG chewable tablet Chew 1 tablet (81 mg total) by mouth daily. 01/17/16  Yes Gladstone Lighter, MD  budesonide-formoterol (SYMBICORT) 160-4.5 MCG/ACT inhaler Inhale 2 puffs into the lungs 2 (two) times daily.   Yes [provider]  docusate sodium (COLACE) 100 MG capsule Take 1 capsule (100 mg total) by mouth daily as  needed. Patient taking differently: Take 100 mg by mouth 2 (two) times daily.  01/17/18 01/17/19 Yes McShane, Gerda Diss, MD  metoprolol tartrate (LOPRESSOR) 25 MG tablet Take 75 mg by mouth 2 (two) times daily.  05/23/18  Yes [provider]  mycophenolate (MYFORTIC) 180 MG EC tablet Take 540 mg by mouth 2 (two) times daily.   Yes [provider]  omeprazole (PRILOSEC) 40 MG capsule Take 40 mg by mouth 2 (two) times daily.  06/07/18  Yes [provider]  pregabalin (LYRICA) 75 MG capsule Take 75 mg by mouth 2 (two) times daily.   Yes [provider]  rosuvastatin (CRESTOR) 5 MG tablet Take 5 mg by mouth daily at 6 PM.  05/09/18  Yes [provider]  Specialty Vitamins Products (MAGNESIUM, AMINO ACID CHELATE,) 133 MG tablet Take 1 tablet by mouth 2 (two) times daily.  05/31/18 05/31/19 Yes [provider]  tacrolimus (PROGRAF) 1 MG capsule Take 5 mg by mouth 2 (two) times daily.   Yes [provider]  traMADol (ULTRAM) 50 MG tablet Take 50 mg by mouth every 6 (six) hours as needed.   Yes [provider]  VENTOLIN HFA 108 (90 Base) MCG/ACT inhaler Inhale 2 puffs into the lungs daily as needed.  05/21/16  Yes [provider]    Review of Systems  Constitutional: Negative for appetite change and fatigue.  HENT: Negative for congestion, nosebleeds, sinus pressure and sore throat.   Eyes: Negative for pain and visual disturbance.  Respiratory: Positive for shortness of breath (minimal). Negative for cough and chest tightness.   Cardiovascular: Negative for chest pain, palpitations and leg swelling.  Gastrointestinal: Negative for abdominal distention and abdominal pain.  Endocrine: Negative.   Genitourinary: Negative.   Musculoskeletal: Negative for back pain and neck pain.  Skin: Negative.   Allergic/Immunologic: Negative.   Neurological: Positive for numbness (tingling down left arm at times due to previous graft).  Negative for dizziness, light-headedness and headaches.  Hematological: Negative for adenopathy. Does not bruise/bleed easily.  Psychiatric/Behavioral: Positive for dysphoric mood (seeing mental health). Negative for sleep disturbance (sleeping on 2 pillows). The patient is nervous/anxious.    Vitals:   06/28/18 0946  BP: 110/70  Pulse: 60  Resp: 18  SpO2: 100%  Weight: 189 lb 6 oz (85.9 kg)  Height: 5\' 3"  (1.6 m)   Wt Readings from Last 3 Encounters:  06/28/18 189 lb 6 oz (85.9 kg)  03/30/18 174 lb 8 oz (79.2 kg)  01/18/18 171 lb (77.6 kg)   Lab Results  Component Value Date   CREATININE 1.63 (H) 01/18/2018   CREATININE 1.79 (H) 01/17/2018   CREATININE 14.43 (H) 11/09/2017    Objective:   Physical Exam  Constitutional: She is oriented to person, place, and time. She appears well-developed and well-nourished.  HENT:  Head: Normocephalic and atraumatic.  Neck:  Normal range of motion. Neck supple.  Cardiovascular: Normal rate and regular rhythm.  Pulmonary/Chest: Effort normal. She has no wheezes. She has no rales.  Abdominal: Soft. She exhibits no distension. There is no tenderness.  Musculoskeletal: She exhibits no edema or tenderness.  Neurological: She is alert and oriented to person, place, and time.  Skin: Skin is warm and dry.  Psychiatric: She has a normal mood and affect. Her behavior is normal. Thought content normal.  Nursing note and vitals reviewed.     Assessment & Plan:  1: Chronic heart failure with preserved ejection fraction-   - NYHA class II - euvolemic today  - weighing daily; Reminded to call for an overnight weight gain of >2 pounds or a weekly weight gain of >5 pounds - weight up 15 pounds from last visit 3 months ago - eating more sweets than usual; encouraged her decrease sugar consumption and increase activity - wearing oxygent at 2L at bedtime - saw cardiologist Rockey Situ) 12/08/17 - not adding salt and is trying to eat low sodium foods.  Reminded about the importance of following a 2000mg  sodium diet. She says that she's being diligent about reading food labels  - BNP on 11/08/17 was 599.0 - PharmD reconciled medications with the patient - she says that she's received her flu vaccine for this season  2: HTN-  - BP looks good today - BMP on 06/13/18 reviewed and showed sodium 139, potassium 4.6, creatinine 1.10   3. Kidney transplant- - received renal transplant 01/08/18 - saw nephrologist [facility]) 06/21/18  Patient did not bring her medications nor a list. Each medication was verbally reviewed with the patient and she was encouraged to bring the bottles to every visit to confirm accuracy of list.  Will not make a return appointment at this time. Advised patient that she could call back at anytime to make another appointment.

## 2018-06-29 ENCOUNTER — Encounter: Payer: Self-pay | Admitting: Family

## 2018-06-29 LAB — LOG10 CMV QN DNA PL

## 2018-06-29 LAB — CMV DNA, QUANTITATIVE, PCR: CMV QUANT: POSITIVE [IU]/mL

## 2018-07-04 LAB — CBC W/ DIFFERENTIAL
BANDED NEUTROPHILS ABSOLUTE COUNT: 0 10*3/uL (ref 0.0–0.1)
BASOPHILS RELATIVE PERCENT: 2 %
EOSINOPHILS ABSOLUTE COUNT: 0.2 10*3/uL (ref 0.0–0.4)
EOSINOPHILS RELATIVE PERCENT: 6 %
HEMATOCRIT: 38.4 % (ref 34.0–46.6)
HEMOGLOBIN: 12.1 g/dL (ref 11.1–15.9)
IMMATURE GRANULOCYTES: 1 %
LYMPHOCYTES ABSOLUTE COUNT: 0.4 10*3/uL — ABNORMAL LOW (ref 0.7–3.1)
MEAN CORPUSCULAR HEMOGLOBIN CONC: 31.5 g/dL (ref 31.5–35.7)
MEAN CORPUSCULAR HEMOGLOBIN: 28.4 pg (ref 26.6–33.0)
MEAN CORPUSCULAR VOLUME: 90 fL (ref 79–97)
MONOCYTES ABSOLUTE COUNT: 0.6 10*3/uL (ref 0.1–0.9)
MONOCYTES RELATIVE PERCENT: 19 %
NEUTROPHILS ABSOLUTE COUNT: 1.9 10*3/uL (ref 1.4–7.0)
NEUTROPHILS RELATIVE PERCENT: 59 %
PLATELET COUNT: 176 10*3/uL (ref 150–450)
RED BLOOD CELL COUNT: 4.26 x10E6/uL (ref 3.77–5.28)
RED CELL DISTRIBUTION WIDTH: 13.7 % (ref 12.3–15.4)
WHITE BLOOD CELL COUNT: 3.1 10*3/uL — ABNORMAL LOW (ref 3.4–10.8)

## 2018-07-04 LAB — HLA DS POST TRANSPLANT
ANTI-DONOR DRW #1 MFI: 0 MFI
ANTI-DONOR DRW #2 MFI: 424 MFI
ANTI-DONOR HLA-A #1 MFI: 490 MFI
ANTI-DONOR HLA-A #2 MFI: 0 MFI
ANTI-DONOR HLA-B #1 MFI: 0 MFI
ANTI-DONOR HLA-B #2 MFI: 0 MFI
ANTI-DONOR HLA-C #1 MFI: 0 MFI
ANTI-DONOR HLA-C #2 MFI: 0 MFI
ANTI-DONOR HLA-DQB #1 MFI: 83 MFI
ANTI-DONOR HLA-DQB #2 MFI: 2428 MFI — ABNORMAL HIGH
ANTI-DONOR HLA-DR #1 MFI: 0 MFI

## 2018-07-04 LAB — HLA CL1 ANTIBODY COMM: Lab: 0

## 2018-07-04 LAB — FSAB CLASS 1 ANTIBODY SPECIFICITY

## 2018-07-04 LAB — HEMOGLOBIN: Lab: 12.1

## 2018-07-04 LAB — HLA CL2 AB COMMENT: Lab: 0

## 2018-07-04 LAB — FSAB CLASS 2 ANTIBODY SPECIFICITY: HLA CL2 AB RESULT: POSITIVE

## 2018-07-04 LAB — DSA PT DONOR ID

## 2018-07-05 LAB — PHOSPHORUS, SERUM: Lab: 3.7

## 2018-07-05 LAB — BASIC METABOLIC PANEL
BLOOD UREA NITROGEN: 22 mg/dL (ref 6–24)
BUN / CREAT RATIO: 19 (ref 9–23)
CALCIUM: 9.9 mg/dL (ref 8.7–10.2)
CO2: 19 mmol/L — ABNORMAL LOW (ref 20–29)
CREATININE: 1.18 mg/dL — ABNORMAL HIGH (ref 0.57–1.00)
GFR MDRD AF AMER: 58 mL/min/{1.73_m2} — ABNORMAL LOW
GFR MDRD NON AF AMER: 51 mL/min/{1.73_m2} — ABNORMAL LOW
GLUCOSE: 105 mg/dL — ABNORMAL HIGH (ref 65–99)
POTASSIUM: 4.8 mmol/L (ref 3.5–5.2)

## 2018-07-05 LAB — TACROLIMUS BLOOD: Lab: 6.1

## 2018-07-05 LAB — POTASSIUM: Lab: 4.8

## 2018-07-05 LAB — MAGNESIUM: Lab: 1.6

## 2018-07-05 MED FILL — PROGRAF 1 MG CAPSULE: 30 days supply | Qty: 300 | Fill #3 | Status: AC

## 2018-07-05 MED FILL — OMEPRAZOLE 40 MG CAPSULE,DELAYED RELEASE: 30 days supply | Qty: 60 | Fill #1 | Status: AC

## 2018-07-05 MED FILL — MYFORTIC 180 MG TABLET,DELAYED RELEASE: 30 days supply | Qty: 180 | Fill #3

## 2018-07-05 MED FILL — OMEPRAZOLE 40 MG CAPSULE,DELAYED RELEASE: ORAL | 30 days supply | Qty: 60 | Fill #1

## 2018-07-05 MED FILL — PROGRAF 1 MG CAPSULE: 30 days supply | Qty: 300 | Fill #3

## 2018-07-05 MED FILL — MYFORTIC 180 MG TABLET,DELAYED RELEASE: 30 days supply | Qty: 180 | Fill #3 | Status: AC

## 2018-07-05 MED FILL — ACETAMINOPHEN 500 MG TABLET: 17 days supply | Qty: 100 | Fill #1

## 2018-07-05 MED FILL — ACETAMINOPHEN 500 MG TABLET: 17 days supply | Qty: 100 | Fill #1 | Status: AC

## 2018-07-06 ENCOUNTER — Other Ambulatory Visit: Payer: Medicare Other | Admitting: Orthotics

## 2018-07-06 LAB — LOG10 CMV QN DNA PL

## 2018-07-11 LAB — CBC W/ DIFFERENTIAL
BANDED NEUTROPHILS ABSOLUTE COUNT: 0 10*3/uL (ref 0.0–0.1)
BASOPHILS ABSOLUTE COUNT: 0.1 10*3/uL (ref 0.0–0.2)
BASOPHILS RELATIVE PERCENT: 2 %
EOSINOPHILS ABSOLUTE COUNT: 0.2 10*3/uL (ref 0.0–0.4)
EOSINOPHILS RELATIVE PERCENT: 7 %
HEMOGLOBIN: 12.3 g/dL (ref 11.1–15.9)
IMMATURE GRANULOCYTES: 0 %
LYMPHOCYTES ABSOLUTE COUNT: 0.4 10*3/uL — ABNORMAL LOW (ref 0.7–3.1)
MEAN CORPUSCULAR HEMOGLOBIN CONC: 33.2 g/dL (ref 31.5–35.7)
MEAN CORPUSCULAR HEMOGLOBIN: 28.9 pg (ref 26.6–33.0)
MONOCYTES ABSOLUTE COUNT: 0.4 10*3/uL (ref 0.1–0.9)
MONOCYTES RELATIVE PERCENT: 15 %
NEUTROPHILS ABSOLUTE COUNT: 1.9 10*3/uL (ref 1.4–7.0)
NEUTROPHILS RELATIVE PERCENT: 63 %
PLATELET COUNT: 176 10*3/uL (ref 150–450)
RED BLOOD CELL COUNT: 4.26 x10E6/uL (ref 3.77–5.28)
RED CELL DISTRIBUTION WIDTH: 13.6 % (ref 12.3–15.4)
WHITE BLOOD CELL COUNT: 2.9 10*3/uL — ABNORMAL LOW (ref 3.4–10.8)

## 2018-07-11 LAB — EOSINOPHILS ABSOLUTE COUNT: Lab: 0.2

## 2018-07-11 MED FILL — AMLODIPINE 10 MG TABLET: 30 days supply | Qty: 30 | Fill #4 | Status: AC

## 2018-07-11 MED FILL — AMLODIPINE 10 MG TABLET: 30 days supply | Qty: 30 | Fill #4

## 2018-07-12 LAB — BASIC METABOLIC PANEL
BLOOD UREA NITROGEN: 22 mg/dL (ref 6–24)
BUN / CREAT RATIO: 18 (ref 9–23)
CALCIUM: 9.7 mg/dL (ref 8.7–10.2)
CHLORIDE: 109 mmol/L — ABNORMAL HIGH (ref 96–106)
CREATININE: 1.25 mg/dL — ABNORMAL HIGH (ref 0.57–1.00)
GFR MDRD AF AMER: 54 mL/min/{1.73_m2} — ABNORMAL LOW
GFR MDRD NON AF AMER: 47 mL/min/{1.73_m2} — ABNORMAL LOW
GLUCOSE: 127 mg/dL — ABNORMAL HIGH (ref 65–99)
POTASSIUM: 4.5 mmol/L (ref 3.5–5.2)
SODIUM: 141 mmol/L (ref 134–144)

## 2018-07-12 LAB — CALCIUM: Lab: 9.7

## 2018-07-12 LAB — MAGNESIUM
Lab: 1.7
MAGNESIUM: 1.7 mg/dL (ref 1.6–2.3)

## 2018-07-12 LAB — PHOSPHORUS, SERUM: Lab: 3.5

## 2018-07-12 LAB — TACROLIMUS BLOOD: Lab: 6.7

## 2018-07-13 LAB — LOG10 CMV QN DNA PL

## 2018-07-15 MED FILL — PREGABALIN 75 MG CAPSULE: ORAL | 30 days supply | Qty: 60 | Fill #0

## 2018-07-15 MED FILL — PREGABALIN 75 MG CAPSULE: 30 days supply | Qty: 60 | Fill #0 | Status: AC

## 2018-07-18 LAB — CBC W/ DIFFERENTIAL
BASOPHILS ABSOLUTE COUNT: 0 10*3/uL (ref 0.0–0.2)
EOSINOPHILS ABSOLUTE COUNT: 0.2 10*3/uL (ref 0.0–0.4)
EOSINOPHILS RELATIVE PERCENT: 6 %
HEMATOCRIT: 36.2 % (ref 34.0–46.6)
HEMOGLOBIN: 11.3 g/dL (ref 11.1–15.9)
IMMATURE GRANULOCYTES: 0 %
LYMPHOCYTES RELATIVE PERCENT: 16 %
MEAN CORPUSCULAR HEMOGLOBIN CONC: 31.2 g/dL — ABNORMAL LOW (ref 31.5–35.7)
MEAN CORPUSCULAR HEMOGLOBIN: 27.7 pg (ref 26.6–33.0)
MEAN CORPUSCULAR VOLUME: 89 fL (ref 79–97)
MONOCYTES ABSOLUTE COUNT: 0.5 10*3/uL (ref 0.1–0.9)
MONOCYTES RELATIVE PERCENT: 18 %
NEUTROPHILS ABSOLUTE COUNT: 1.6 10*3/uL (ref 1.4–7.0)
NEUTROPHILS RELATIVE PERCENT: 59 %
PLATELET COUNT: 166 10*3/uL (ref 150–450)
RED BLOOD CELL COUNT: 4.08 x10E6/uL (ref 3.77–5.28)
WHITE BLOOD CELL COUNT: 2.8 10*3/uL — ABNORMAL LOW (ref 3.4–10.8)

## 2018-07-18 LAB — IMMATURE GRANULOCYTES: Lab: 0

## 2018-07-19 LAB — BASIC METABOLIC PANEL
BLOOD UREA NITROGEN: 17 mg/dL (ref 6–24)
BUN / CREAT RATIO: 15 (ref 9–23)
CHLORIDE: 107 mmol/L — ABNORMAL HIGH (ref 96–106)
CO2: 18 mmol/L — ABNORMAL LOW (ref 20–29)
CREATININE: 1.1 mg/dL — ABNORMAL HIGH (ref 0.57–1.00)
GFR MDRD AF AMER: 64 mL/min/{1.73_m2}
GLUCOSE: 145 mg/dL — ABNORMAL HIGH (ref 65–99)
SODIUM: 141 mmol/L (ref 134–144)

## 2018-07-19 LAB — PHOSPHORUS, SERUM: Lab: 3.7

## 2018-07-19 LAB — SODIUM: Lab: 141

## 2018-07-19 LAB — MAGNESIUM: Lab: 1.5 — ABNORMAL LOW

## 2018-07-19 LAB — TACROLIMUS BLOOD: Lab: 8.2

## 2018-07-21 LAB — CMV QUANT: Lab: POSITIVE

## 2018-07-21 NOTE — Unmapped (Signed)
Pt called c/o leg swelling that goes away, she stated she hasn't been drinking a lot of water and has been busy and  running around talking.  Made her aware to increase her water and to put her feet up and rest and reassured her that her last Cr.was 1.10, TNC made the suggestion for her to wear compression socks while she is running around, pt just wanted to make sure she was doing what she is supposed to do

## 2018-07-25 LAB — CBC W/ DIFFERENTIAL
BANDED NEUTROPHILS ABSOLUTE COUNT: 0 10*3/uL (ref 0.0–0.1)
BASOPHILS ABSOLUTE COUNT: 0 10*3/uL (ref 0.0–0.2)
BASOPHILS RELATIVE PERCENT: 1 %
EOSINOPHILS ABSOLUTE COUNT: 0.2 10*3/uL (ref 0.0–0.4)
EOSINOPHILS RELATIVE PERCENT: 5 %
HEMOGLOBIN: 11.5 g/dL (ref 11.1–15.9)
IMMATURE GRANULOCYTES: 1 %
LYMPHOCYTES RELATIVE PERCENT: 13 %
MEAN CORPUSCULAR HEMOGLOBIN CONC: 30.8 g/dL — ABNORMAL LOW (ref 31.5–35.7)
MEAN CORPUSCULAR HEMOGLOBIN: 26.8 pg (ref 26.6–33.0)
MEAN CORPUSCULAR VOLUME: 87 fL (ref 79–97)
MONOCYTES ABSOLUTE COUNT: 0.5 10*3/uL (ref 0.1–0.9)
MONOCYTES RELATIVE PERCENT: 13 %
NEUTROPHILS ABSOLUTE COUNT: 2.3 10*3/uL (ref 1.4–7.0)
NEUTROPHILS RELATIVE PERCENT: 67 %
PLATELET COUNT: 169 10*3/uL (ref 150–450)
RED BLOOD CELL COUNT: 4.29 x10E6/uL (ref 3.77–5.28)
RED CELL DISTRIBUTION WIDTH: 13.5 % (ref 12.3–15.4)
WHITE BLOOD CELL COUNT: 3.4 10*3/uL (ref 3.4–10.8)

## 2018-07-25 LAB — BASOPHILS ABSOLUTE COUNT: Lab: 0

## 2018-07-26 LAB — BASIC METABOLIC PANEL
BLOOD UREA NITROGEN: 21 mg/dL (ref 6–24)
CALCIUM: 9.1 mg/dL (ref 8.7–10.2)
CHLORIDE: 110 mmol/L — ABNORMAL HIGH (ref 96–106)
CO2: 16 mmol/L — ABNORMAL LOW (ref 20–29)
CREATININE: 1.21 mg/dL — ABNORMAL HIGH (ref 0.57–1.00)
GFR MDRD AF AMER: 57 mL/min/{1.73_m2} — ABNORMAL LOW
GFR MDRD NON AF AMER: 49 mL/min/{1.73_m2} — ABNORMAL LOW
GLUCOSE: 138 mg/dL — ABNORMAL HIGH (ref 65–99)

## 2018-07-26 LAB — PHOSPHORUS, SERUM: Lab: 3.3

## 2018-07-26 LAB — PHOSPHORUS: PHOSPHORUS, SERUM: 3.3 mg/dL (ref 2.5–4.5)

## 2018-07-26 LAB — GFR MDRD AF AMER: Lab: 57 — ABNORMAL LOW

## 2018-07-26 LAB — TACROLIMUS BLOOD: Lab: 8.1

## 2018-07-26 LAB — MAGNESIUM: Lab: 1.5 — ABNORMAL LOW

## 2018-07-26 NOTE — Unmapped (Signed)
12/17 - Patient states she has about 3 weeks of Prograf [place], scheduling next refill call for after holidays so we can double check with her.      [facility] Refill Coordination Note    Specialty Medication(s) to be Shipped:   Transplant: Myfortic 180mg     Other medication(s) to be shipped: ROSUVASTATIN, METOPROLOL, DOK 100     Traniya Prichett, DOB: September 14, 1960  Phone: [phone] (home)       All above HIPAA information was verified with patient.     Completed refill call assessment today to schedule patient's medication shipment from the [facility] [phone]).       Specialty medication(s) and dose(s) confirmed: Regimen is correct and unchanged.   Changes to medications: Charliegh reports no changes reported at this time.  Changes to insurance: No  Questions for the pharmacist: No    The patient will receive a drug information handout for each medication shipped and additional FDA Medication Guides as required.      DISEASE/MEDICATION-SPECIFIC INFORMATION        N/A    ADHERENCE     Medication Adherence        Adherence tools used:  patient uses a pill box to manage medications                      MEDICARE PART B DOCUMENTATION     Myfortic 180mg : Patient has about 60  tablets on hand.    SHIPPING     Shipping address confirmed in Epic.     Delivery Scheduled: Yes, Expected medication delivery date: 07/28/18 via UPS or courier.     Medication will be delivered via UPS to the home address in Epic [place].    Arnold Long   [facility] Specialty Pharmacist

## 2018-07-27 LAB — LOG10 CMV QN DNA PL

## 2018-07-27 MED FILL — DOK 100 MG CAPSULE: ORAL | 30 days supply | Qty: 60 | Fill #4

## 2018-07-27 MED FILL — METOPROLOL TARTRATE 25 MG TABLET: 30 days supply | Qty: 180 | Fill #4 | Status: AC

## 2018-07-27 MED FILL — DOK 100 MG CAPSULE: 30 days supply | Qty: 60 | Fill #4 | Status: AC

## 2018-07-27 MED FILL — MYFORTIC 180 MG TABLET,DELAYED RELEASE: 30 days supply | Qty: 180 | Fill #4

## 2018-07-27 MED FILL — MYFORTIC 180 MG TABLET,DELAYED RELEASE: 30 days supply | Qty: 180 | Fill #4 | Status: AC

## 2018-07-27 MED FILL — METOPROLOL TARTRATE 25 MG TABLET: ORAL | 30 days supply | Qty: 180 | Fill #4

## 2018-07-29 MED FILL — ROSUVASTATIN 5 MG TABLET: ORAL | 90 days supply | Qty: 90 | Fill #1

## 2018-07-29 MED FILL — ROSUVASTATIN 5 MG TABLET: 90 days supply | Qty: 90 | Fill #1 | Status: AC

## 2018-08-02 LAB — BASIC METABOLIC PANEL
BLOOD UREA NITROGEN: 23 mg/dL (ref 6–24)
BUN / CREAT RATIO: 19 (ref 9–23)
CO2: 18 mmol/L — ABNORMAL LOW (ref 20–29)
CREATININE: 1.21 mg/dL — ABNORMAL HIGH (ref 0.57–1.00)
GFR MDRD AF AMER: 57 mL/min/{1.73_m2} — ABNORMAL LOW
GFR MDRD NON AF AMER: 49 mL/min/{1.73_m2} — ABNORMAL LOW
GLUCOSE: 108 mg/dL — ABNORMAL HIGH (ref 65–99)
POTASSIUM: 4.3 mmol/L (ref 3.5–5.2)
SODIUM: 139 mmol/L (ref 134–144)

## 2018-08-02 LAB — CBC W/ DIFFERENTIAL
BASOPHILS ABSOLUTE COUNT: 0 10*3/uL (ref 0.0–0.2)
EOSINOPHILS ABSOLUTE COUNT: 0.1 10*3/uL (ref 0.0–0.4)
EOSINOPHILS RELATIVE PERCENT: 5 %
HEMATOCRIT: 36.2 % (ref 34.0–46.6)
HEMOGLOBIN: 11.5 g/dL (ref 11.1–15.9)
IMMATURE GRANULOCYTES: 0 %
LYMPHOCYTES RELATIVE PERCENT: 16 %
MEAN CORPUSCULAR HEMOGLOBIN CONC: 31.8 g/dL (ref 31.5–35.7)
MEAN CORPUSCULAR HEMOGLOBIN: 28 pg (ref 26.6–33.0)
MEAN CORPUSCULAR VOLUME: 88 fL (ref 79–97)
MONOCYTES RELATIVE PERCENT: 16 %
NEUTROPHILS ABSOLUTE COUNT: 1.8 10*3/uL (ref 1.4–7.0)
NEUTROPHILS RELATIVE PERCENT: 62 %
PLATELET COUNT: 176 10*3/uL (ref 150–450)
RED BLOOD CELL COUNT: 4.1 x10E6/uL (ref 3.77–5.28)
RED CELL DISTRIBUTION WIDTH: 13.6 % (ref 12.3–15.4)
WHITE BLOOD CELL COUNT: 2.9 10*3/uL — ABNORMAL LOW (ref 3.4–10.8)

## 2018-08-02 LAB — TACROLIMUS BLOOD: Lab: 6.4

## 2018-08-02 LAB — IMMATURE GRANULOCYTES: Lab: 0

## 2018-08-02 LAB — MAGNESIUM: Lab: 1.6

## 2018-08-02 LAB — PHOSPHORUS, SERUM: Lab: 3.4

## 2018-08-02 LAB — SODIUM: Lab: 139

## 2018-08-03 LAB — CMV QUANT: Lab: NEGATIVE

## 2018-08-08 LAB — CBC W/ DIFFERENTIAL
BANDED NEUTROPHILS ABSOLUTE COUNT: 0 10*3/uL (ref 0.0–0.1)
BASOPHILS ABSOLUTE COUNT: 0.1 10*3/uL (ref 0.0–0.2)
BASOPHILS RELATIVE PERCENT: 2 %
EOSINOPHILS ABSOLUTE COUNT: 0.1 10*3/uL (ref 0.0–0.4)
EOSINOPHILS RELATIVE PERCENT: 4 %
IMMATURE GRANULOCYTES: 0 %
LYMPHOCYTES ABSOLUTE COUNT: 0.6 10*3/uL — ABNORMAL LOW (ref 0.7–3.1)
LYMPHOCYTES RELATIVE PERCENT: 20 %
MEAN CORPUSCULAR HEMOGLOBIN CONC: 33 g/dL (ref 31.5–35.7)
MEAN CORPUSCULAR VOLUME: 85 fL (ref 79–97)
MONOCYTES ABSOLUTE COUNT: 0.4 10*3/uL (ref 0.1–0.9)
MONOCYTES RELATIVE PERCENT: 13 %
NEUTROPHILS ABSOLUTE COUNT: 1.9 10*3/uL (ref 1.4–7.0)
NEUTROPHILS RELATIVE PERCENT: 61 %
PLATELET COUNT: 177 10*3/uL (ref 150–450)
RED BLOOD CELL COUNT: 4.26 x10E6/uL (ref 3.77–5.28)
WHITE BLOOD CELL COUNT: 3.1 10*3/uL — ABNORMAL LOW (ref 3.4–10.8)

## 2018-08-08 LAB — NEUTROPHILS RELATIVE PERCENT: Lab: 61

## 2018-08-08 NOTE — Unmapped (Signed)
[facility] Refill Coordination Note    Specialty Medication(s) to be Shipped:   Transplant: Prograf 1mg     Other medication(s) to be shipped:   omeprazole (PRILOSEC) 40 MG  amLODIPine (NORVASC) 10 MG  Lyrica 75mg      Danielle Parker, DOB: Feb 26, 1961  Phone: [phone] (home)       All above HIPAA information was verified with patient.     Completed refill call assessment today to schedule patient's medication shipment from the [facility] [phone]).       Specialty medication(s) and dose(s) confirmed: Regimen is correct and unchanged.   Changes to medications: Rosabella reports no changes reported at this time.  Changes to insurance: No  Questions for the pharmacist: No    The patient will receive a drug information handout for each medication shipped and additional FDA Medication Guides as required.      DISEASE/MEDICATION-SPECIFIC INFORMATION        N/A    ADHERENCE     Medication Adherence    Patient reported X missed doses in the last month:  0      Adherence tools used:  patient uses a pill box to manage medications                      MEDICARE PART B DOCUMENTATION     Prograf 1mg : Patient has 5 days worth of capsules on hand.    SHIPPING     Shipping address confirmed in Epic.     Delivery Scheduled: Yes, Expected medication delivery date: 08/12/17 via UPS or courier.     Medication will be delivered via Next Day Courier to the home address in Epic [place].    [place] A Jarid Sasso   [facility] Specialty Technician

## 2018-08-09 LAB — BASIC METABOLIC PANEL
BLOOD UREA NITROGEN: 24 mg/dL (ref 6–24)
BUN / CREAT RATIO: 20 (ref 9–23)
CALCIUM: 9.5 mg/dL (ref 8.7–10.2)
CHLORIDE: 106 mmol/L (ref 96–106)
CREATININE: 1.23 mg/dL — ABNORMAL HIGH (ref 0.57–1.00)
GFR MDRD NON AF AMER: 48 mL/min/{1.73_m2} — ABNORMAL LOW
GLUCOSE: 129 mg/dL — ABNORMAL HIGH (ref 65–99)
SODIUM: 139 mmol/L (ref 134–144)

## 2018-08-09 LAB — MAGNESIUM: Lab: 1.6

## 2018-08-09 LAB — GLUCOSE: Lab: 129 — ABNORMAL HIGH

## 2018-08-09 LAB — PHOSPHORUS, SERUM: Lab: 3.3

## 2018-08-10 LAB — TACROLIMUS BLOOD: Lab: 8.4

## 2018-08-10 LAB — LOG10 CMV QN DNA PL

## 2018-08-11 MED FILL — OMEPRAZOLE 40 MG CAPSULE,DELAYED RELEASE: ORAL | 30 days supply | Qty: 60 | Fill #2

## 2018-08-11 MED FILL — PREGABALIN 75 MG CAPSULE: ORAL | 30 days supply | Qty: 60 | Fill #1

## 2018-08-11 MED FILL — OMEPRAZOLE 40 MG CAPSULE,DELAYED RELEASE: 30 days supply | Qty: 60 | Fill #2 | Status: AC

## 2018-08-11 MED FILL — AMLODIPINE 10 MG TABLET: 30 days supply | Qty: 30 | Fill #5 | Status: AC

## 2018-08-11 MED FILL — AMLODIPINE 10 MG TABLET: 30 days supply | Qty: 30 | Fill #5

## 2018-08-11 MED FILL — PROGRAF 1 MG CAPSULE: 30 days supply | Qty: 300 | Fill #4 | Status: AC

## 2018-08-11 MED FILL — PROGRAF 1 MG CAPSULE: 30 days supply | Qty: 300 | Fill #4

## 2018-08-11 MED FILL — PREGABALIN 75 MG CAPSULE: 30 days supply | Qty: 60 | Fill #1 | Status: AC

## 2018-08-13 NOTE — Unmapped (Signed)
WFD tired to delivery twice today but pt was not home called pt and we decided to try again for a Saturday delivery of 08/13/18.

## 2018-08-15 LAB — CBC W/ DIFFERENTIAL
BANDED NEUTROPHILS ABSOLUTE COUNT: 0 10*3/uL (ref 0.0–0.1)
BASOPHILS ABSOLUTE COUNT: 0 10*3/uL (ref 0.0–0.2)
BASOPHILS RELATIVE PERCENT: 1 %
EOSINOPHILS ABSOLUTE COUNT: 0.2 10*3/uL (ref 0.0–0.4)
HEMATOCRIT: 35.7 % (ref 34.0–46.6)
HEMOGLOBIN: 11.8 g/dL (ref 11.1–15.9)
IMMATURE GRANULOCYTES: 0 %
LYMPHOCYTES ABSOLUTE COUNT: 0.5 10*3/uL — ABNORMAL LOW (ref 0.7–3.1)
LYMPHOCYTES RELATIVE PERCENT: 15 %
MEAN CORPUSCULAR HEMOGLOBIN CONC: 33.1 g/dL (ref 31.5–35.7)
MEAN CORPUSCULAR VOLUME: 86 fL (ref 79–97)
MONOCYTES ABSOLUTE COUNT: 0.6 10*3/uL (ref 0.1–0.9)
MONOCYTES RELATIVE PERCENT: 18 %
NEUTROPHILS ABSOLUTE COUNT: 1.9 10*3/uL (ref 1.4–7.0)
NEUTROPHILS RELATIVE PERCENT: 61 %
PLATELET COUNT: 164 10*3/uL (ref 150–450)
RED BLOOD CELL COUNT: 4.13 x10E6/uL (ref 3.77–5.28)
RED CELL DISTRIBUTION WIDTH: 13.7 % (ref 11.7–15.4)
WHITE BLOOD CELL COUNT: 3.2 10*3/uL — ABNORMAL LOW (ref 3.4–10.8)

## 2018-08-15 LAB — RED BLOOD CELL COUNT: Lab: 4.13

## 2018-08-16 LAB — BASIC METABOLIC PANEL
BLOOD UREA NITROGEN: 16 mg/dL (ref 6–24)
CALCIUM: 9.7 mg/dL (ref 8.7–10.2)
CHLORIDE: 108 mmol/L — ABNORMAL HIGH (ref 96–106)
CO2: 19 mmol/L — ABNORMAL LOW (ref 20–29)
GFR MDRD AF AMER: 60 mL/min/{1.73_m2}
GFR MDRD NON AF AMER: 52 mL/min/{1.73_m2} — ABNORMAL LOW
GLUCOSE: 110 mg/dL — ABNORMAL HIGH (ref 65–99)
SODIUM: 142 mmol/L (ref 134–144)

## 2018-08-16 LAB — PHOSPHORUS, SERUM: Lab: 3.7

## 2018-08-16 LAB — TACROLIMUS BLOOD: Lab: 7.7

## 2018-08-16 LAB — CREATININE: Lab: 1.16 — ABNORMAL HIGH

## 2018-08-16 LAB — MAGNESIUM: Lab: 1.7

## 2018-08-17 LAB — CMV DNA, QUANTITATIVE, PCR: CMV QUANT: POSITIVE [IU]/mL

## 2018-08-17 LAB — CMV QUANT: Lab: POSITIVE

## 2018-08-22 LAB — CBC W/ DIFFERENTIAL
BANDED NEUTROPHILS ABSOLUTE COUNT: 0 10*3/uL (ref 0.0–0.1)
BASOPHILS ABSOLUTE COUNT: 0 10*3/uL (ref 0.0–0.2)
BASOPHILS RELATIVE PERCENT: 1 %
EOSINOPHILS ABSOLUTE COUNT: 0.1 10*3/uL (ref 0.0–0.4)
EOSINOPHILS RELATIVE PERCENT: 4 %
HEMATOCRIT: 36.3 % (ref 34.0–46.6)
HEMOGLOBIN: 11.9 g/dL (ref 11.1–15.9)
IMMATURE GRANULOCYTES: 1 %
LYMPHOCYTES ABSOLUTE COUNT: 0.6 10*3/uL — ABNORMAL LOW (ref 0.7–3.1)
LYMPHOCYTES RELATIVE PERCENT: 18 %
MEAN CORPUSCULAR HEMOGLOBIN: 28.5 pg (ref 26.6–33.0)
MONOCYTES ABSOLUTE COUNT: 0.5 10*3/uL (ref 0.1–0.9)
MONOCYTES RELATIVE PERCENT: 14 %
NEUTROPHILS ABSOLUTE COUNT: 2 10*3/uL (ref 1.4–7.0)
NEUTROPHILS RELATIVE PERCENT: 62 %
PLATELET COUNT: 161 10*3/uL (ref 150–450)
RED BLOOD CELL COUNT: 4.18 x10E6/uL (ref 3.77–5.28)
RED CELL DISTRIBUTION WIDTH: 13.6 % (ref 11.7–15.4)
WHITE BLOOD CELL COUNT: 3.2 10*3/uL — ABNORMAL LOW (ref 3.4–10.8)

## 2018-08-22 LAB — BASOPHILS RELATIVE PERCENT: Lab: 1

## 2018-08-23 LAB — MAGNESIUM: Lab: 1.6

## 2018-08-23 LAB — BASIC METABOLIC PANEL
BLOOD UREA NITROGEN: 16 mg/dL (ref 6–24)
CALCIUM: 9.8 mg/dL (ref 8.7–10.2)
CHLORIDE: 107 mmol/L — ABNORMAL HIGH (ref 96–106)
CO2: 17 mmol/L — ABNORMAL LOW (ref 20–29)
CREATININE: 1.14 mg/dL — ABNORMAL HIGH (ref 0.57–1.00)
GFR MDRD AF AMER: 61 mL/min/{1.73_m2}
POTASSIUM: 4.7 mmol/L (ref 3.5–5.2)
SODIUM: 141 mmol/L (ref 134–144)

## 2018-08-23 LAB — TACROLIMUS BLOOD: Lab: 5.9

## 2018-08-23 LAB — CREATININE: Lab: 1.14 — ABNORMAL HIGH

## 2018-08-23 LAB — PHOSPHORUS, SERUM: Lab: 3.8

## 2018-08-23 NOTE — Unmapped (Signed)
Patient called to set up delivery for metoprolol and docusate- sending out today for delivery tomorrow    No specialty medications needed at this time    She has a bottle and a half of myfortic and a few bottles of prograf    She is having her medications pill packed    Everlean Cherry CPHT

## 2018-08-24 LAB — LOG10 CMV QN DNA PL

## 2018-08-24 LAB — CMV DNA, QUANTITATIVE, PCR

## 2018-08-31 LAB — CBC W/ DIFFERENTIAL
BANDED NEUTROPHILS ABSOLUTE COUNT: 0 10*3/uL (ref 0.0–0.1)
BASOPHILS ABSOLUTE COUNT: 0.1 10*3/uL (ref 0.0–0.2)
BASOPHILS RELATIVE PERCENT: 1 %
EOSINOPHILS ABSOLUTE COUNT: 0.1 10*3/uL (ref 0.0–0.4)
EOSINOPHILS RELATIVE PERCENT: 4 %
HEMATOCRIT: 38.9 % (ref 34.0–46.6)
IMMATURE GRANULOCYTES: 0 %
LYMPHOCYTES ABSOLUTE COUNT: 0.7 10*3/uL (ref 0.7–3.1)
LYMPHOCYTES RELATIVE PERCENT: 18 %
MEAN CORPUSCULAR HEMOGLOBIN CONC: 32.1 g/dL (ref 31.5–35.7)
MEAN CORPUSCULAR HEMOGLOBIN: 28 pg (ref 26.6–33.0)
MEAN CORPUSCULAR VOLUME: 87 fL (ref 79–97)
MONOCYTES ABSOLUTE COUNT: 0.6 10*3/uL (ref 0.1–0.9)
MONOCYTES RELATIVE PERCENT: 15 %
NEUTROPHILS ABSOLUTE COUNT: 2.4 10*3/uL (ref 1.4–7.0)
NEUTROPHILS RELATIVE PERCENT: 62 %
PLATELET COUNT: 185 10*3/uL (ref 150–450)
RED BLOOD CELL COUNT: 4.47 x10E6/uL (ref 3.77–5.28)
RED CELL DISTRIBUTION WIDTH: 13.8 % (ref 11.7–15.4)
WHITE BLOOD CELL COUNT: 3.8 10*3/uL (ref 3.4–10.8)

## 2018-08-31 LAB — PHOSPHORUS, SERUM: Lab: 4

## 2018-08-31 LAB — BASIC METABOLIC PANEL
BLOOD UREA NITROGEN: 21 mg/dL (ref 6–24)
BUN / CREAT RATIO: 20 (ref 9–23)
CALCIUM: 10 mg/dL (ref 8.7–10.2)
CHLORIDE: 105 mmol/L (ref 96–106)
CO2: 21 mmol/L (ref 20–29)
CREATININE: 1.07 mg/dL — ABNORMAL HIGH (ref 0.57–1.00)
GFR MDRD NON AF AMER: 57 mL/min/{1.73_m2} — ABNORMAL LOW
GLUCOSE: 100 mg/dL — ABNORMAL HIGH (ref 65–99)
POTASSIUM: 5.1 mmol/L (ref 3.5–5.2)

## 2018-08-31 LAB — CALCIUM: Lab: 10

## 2018-08-31 LAB — IMMATURE GRANULOCYTES: Lab: 0

## 2018-08-31 LAB — MAGNESIUM: Lab: 1.7

## 2018-08-31 LAB — TACROLIMUS BLOOD: Lab: 7.5

## 2018-08-31 NOTE — Unmapped (Signed)
Pt called and stated she had labs this morning and then left and went to visit her old dialysis center across the way and got to talking and just now realized she forgot to take her 9 AM meds.  Made her aware just to take her 9 pm meds tonight and get back on track tomorrow.  Pt stated understanding.

## 2018-09-01 LAB — CMV QUANT: Lab: NEGATIVE

## 2018-09-06 LAB — BASIC METABOLIC PANEL
BLOOD UREA NITROGEN: 18 mg/dL (ref 6–24)
BUN / CREAT RATIO: 15 (ref 9–23)
CALCIUM: 9.4 mg/dL (ref 8.7–10.2)
CHLORIDE: 108 mmol/L — ABNORMAL HIGH (ref 96–106)
CO2: 19 mmol/L — ABNORMAL LOW (ref 20–29)
CREATININE: 1.17 mg/dL — ABNORMAL HIGH (ref 0.57–1.00)
GFR MDRD NON AF AMER: 51 mL/min/{1.73_m2} — ABNORMAL LOW
GLUCOSE: 97 mg/dL (ref 65–99)
POTASSIUM: 4.7 mmol/L (ref 3.5–5.2)

## 2018-09-06 LAB — CBC W/ DIFFERENTIAL
BANDED NEUTROPHILS ABSOLUTE COUNT: 0 10*3/uL (ref 0.0–0.1)
BASOPHILS RELATIVE PERCENT: 1 %
EOSINOPHILS ABSOLUTE COUNT: 0.2 10*3/uL (ref 0.0–0.4)
EOSINOPHILS RELATIVE PERCENT: 5 %
HEMATOCRIT: 37.9 % (ref 34.0–46.6)
HEMOGLOBIN: 11.8 g/dL (ref 11.1–15.9)
IMMATURE GRANULOCYTES: 1 %
LYMPHOCYTES ABSOLUTE COUNT: 0.7 10*3/uL (ref 0.7–3.1)
LYMPHOCYTES RELATIVE PERCENT: 21 %
MEAN CORPUSCULAR HEMOGLOBIN: 27.7 pg (ref 26.6–33.0)
MEAN CORPUSCULAR VOLUME: 89 fL (ref 79–97)
MONOCYTES ABSOLUTE COUNT: 0.5 10*3/uL (ref 0.1–0.9)
MONOCYTES RELATIVE PERCENT: 14 %
NEUTROPHILS ABSOLUTE COUNT: 2.1 10*3/uL (ref 1.4–7.0)
NEUTROPHILS RELATIVE PERCENT: 58 %
PLATELET COUNT: 188 10*3/uL (ref 150–450)
RED BLOOD CELL COUNT: 4.26 x10E6/uL (ref 3.77–5.28)
RED CELL DISTRIBUTION WIDTH: 13.7 % (ref 11.7–15.4)

## 2018-09-06 LAB — TACROLIMUS BLOOD: Lab: 9.1

## 2018-09-06 LAB — PHOSPHORUS, SERUM: Lab: 3.4

## 2018-09-06 LAB — BASOPHILS RELATIVE PERCENT: Lab: 1

## 2018-09-06 LAB — MAGNESIUM: Lab: 1.6

## 2018-09-06 LAB — SODIUM: Lab: 142

## 2018-09-09 NOTE — Unmapped (Signed)
PT reported that her new insurance requires her to get all of her medication through a different specialty pharmacy. PT requested to be dis-enrolled from the [facility] program.

## 2018-09-12 ENCOUNTER — Telehealth: Payer: Self-pay | Admitting: Podiatry

## 2018-09-12 NOTE — Telephone Encounter (Signed)
Detrice from [facility] called and left message on 1.30.2020 @1057  asking for a call back regarding pt.  I returned call and she was asking if the shoes have been billed yet.  I explained that we cannot bill pt until patient picks them up and she is scheduled on 2.12.2020 and medicare will cover 80% of the cost.

## 2018-09-12 NOTE — Telephone Encounter (Signed)
Detrice from [facility] called back and they are going to try to get the diabetic shoes and inserts covered for the patient. I gave her the total cost of 604.00. A5500 total is 190.00 for the pair and A5514 is 414.00 total for 3 pr. She asked if she could get an invoice and I explained that we cannot bill pt until she picks them up.

## 2018-09-14 ENCOUNTER — Encounter: Admit: 2018-09-14 | Discharge: 2018-09-14 | Payer: 59

## 2018-09-14 ENCOUNTER — Encounter: Admit: 2018-09-14 | Discharge: 2018-09-14 | Payer: 59 | Attending: Nephrology | Primary: Nephrology

## 2018-09-14 DIAGNOSIS — Z79899 Other long term (current) drug therapy: Secondary | ICD-10-CM

## 2018-09-14 DIAGNOSIS — Z940 Kidney transplant status: Principal | ICD-10-CM

## 2018-09-14 MED ORDER — AMLODIPINE 5 MG TABLET
ORAL_TABLET | Freq: Every day | ORAL | 11 refills | 0.00000 days | Status: CP
Start: 2018-09-14 — End: 2019-09-14

## 2018-09-21 ENCOUNTER — Ambulatory Visit (INDEPENDENT_AMBULATORY_CARE_PROVIDER_SITE_OTHER): Payer: Medicare (Managed Care) | Admitting: Orthotics

## 2018-09-21 DIAGNOSIS — E1142 Type 2 diabetes mellitus with diabetic polyneuropathy: Secondary | ICD-10-CM

## 2018-09-21 DIAGNOSIS — M2011 Hallux valgus (acquired), right foot: Secondary | ICD-10-CM

## 2018-09-21 DIAGNOSIS — M2012 Hallux valgus (acquired), left foot: Secondary | ICD-10-CM | POA: Diagnosis not present

## 2018-09-21 DIAGNOSIS — M2041 Other hammer toe(s) (acquired), right foot: Secondary | ICD-10-CM | POA: Diagnosis not present

## 2018-09-21 DIAGNOSIS — M2042 Other hammer toe(s) (acquired), left foot: Secondary | ICD-10-CM | POA: Diagnosis not present

## 2018-09-21 NOTE — Progress Notes (Signed)

## 2018-10-05 DIAGNOSIS — R519 Headache, unspecified: Secondary | ICD-10-CM | POA: Insufficient documentation

## 2018-10-10 DIAGNOSIS — Z940 Kidney transplant status: Principal | ICD-10-CM

## 2018-10-10 DIAGNOSIS — Z79899 Other long term (current) drug therapy: Principal | ICD-10-CM

## 2018-10-16 DIAGNOSIS — Z940 Kidney transplant status: Principal | ICD-10-CM

## 2018-10-17 DIAGNOSIS — Z940 Kidney transplant status: Principal | ICD-10-CM

## 2018-10-17 DIAGNOSIS — Z79899 Other long term (current) drug therapy: Principal | ICD-10-CM

## 2018-10-18 DIAGNOSIS — J45909 Unspecified asthma, uncomplicated: Principal | ICD-10-CM

## 2018-10-18 DIAGNOSIS — Z7982 Long term (current) use of aspirin: Principal | ICD-10-CM

## 2018-10-18 DIAGNOSIS — R0602 Shortness of breath: Principal | ICD-10-CM

## 2018-10-18 DIAGNOSIS — J069 Acute upper respiratory infection, unspecified: Principal | ICD-10-CM

## 2018-10-18 DIAGNOSIS — G43909 Migraine, unspecified, not intractable, without status migrainosus: Principal | ICD-10-CM

## 2018-10-18 DIAGNOSIS — R600 Localized edema: Principal | ICD-10-CM

## 2018-10-18 DIAGNOSIS — Z885 Allergy status to narcotic agent status: Principal | ICD-10-CM

## 2018-10-18 DIAGNOSIS — I10 Essential (primary) hypertension: Principal | ICD-10-CM

## 2018-10-18 DIAGNOSIS — I129 Hypertensive chronic kidney disease with stage 1 through stage 4 chronic kidney disease, or unspecified chronic kidney disease: Principal | ICD-10-CM

## 2018-10-18 DIAGNOSIS — R928 Other abnormal and inconclusive findings on diagnostic imaging of breast: Principal | ICD-10-CM

## 2018-10-18 DIAGNOSIS — N189 Chronic kidney disease, unspecified: Principal | ICD-10-CM

## 2018-10-18 DIAGNOSIS — J449 Chronic obstructive pulmonary disease, unspecified: Principal | ICD-10-CM

## 2018-10-18 DIAGNOSIS — N049 Nephrotic syndrome with unspecified morphologic changes: Principal | ICD-10-CM

## 2018-10-18 DIAGNOSIS — Z87891 Personal history of nicotine dependence: Principal | ICD-10-CM

## 2018-10-18 DIAGNOSIS — M791 Myalgia, unspecified site: Principal | ICD-10-CM

## 2018-10-18 DIAGNOSIS — K219 Gastro-esophageal reflux disease without esophagitis: Principal | ICD-10-CM

## 2018-10-19 ENCOUNTER — Encounter: Admit: 2018-10-19 | Discharge: 2018-10-19 | Disposition: A | Discharge status: Home / Self Care | Payer: 59

## 2018-10-19 ENCOUNTER — Emergency Department: Admit: 2018-10-19 | Discharge: 2018-10-19 | Disposition: A | Discharge status: Home / Self Care | Payer: 59

## 2018-10-19 MED ORDER — PREDNISONE 20 MG TABLET
ORAL_TABLET | Freq: Every day | ORAL | 0 refills | 0.00000 days | Status: CP
Start: 2018-10-19 — End: 2018-10-23

## 2018-10-19 MED ORDER — PHENOL 0.6 % ORAL MUCOSAL LIQUID
Freq: Two times a day (BID) | OROMUCOSAL | 0 refills | 0 days | Status: CP
Start: 2018-10-19 — End: 2018-10-24

## 2018-10-24 DIAGNOSIS — Z940 Kidney transplant status: Principal | ICD-10-CM

## 2018-10-24 DIAGNOSIS — Z79899 Other long term (current) drug therapy: Principal | ICD-10-CM

## 2018-10-31 DIAGNOSIS — Z940 Kidney transplant status: Principal | ICD-10-CM

## 2018-10-31 DIAGNOSIS — Z79899 Other long term (current) drug therapy: Principal | ICD-10-CM

## 2018-11-02 MED ORDER — IPRATROPIUM 0.5 MG-ALBUTEROL 3 MG (2.5 MG BASE)/3 ML NEBULIZATION SOLN
ORAL | 0 days
Start: 2018-11-02 — End: ?

## 2018-11-07 DIAGNOSIS — Z79899 Other long term (current) drug therapy: Principal | ICD-10-CM

## 2018-11-07 DIAGNOSIS — Z940 Kidney transplant status: Principal | ICD-10-CM

## 2018-11-08 ENCOUNTER — Telehealth: Payer: Self-pay | Admitting: Orthotics

## 2018-11-08 NOTE — Telephone Encounter (Signed)
Cecille Rubin from [facility] called and needs Consult Report for Omeka Holben Feb 12th visit. Please fax to [phone]

## 2018-12-20 ENCOUNTER — Encounter: Admit: 2018-12-20 | Discharge: 2018-12-21 | Payer: 59

## 2018-12-21 ENCOUNTER — Encounter: Admit: 2018-12-21 | Discharge: 2018-12-22 | Payer: 59 | Attending: Nephrology | Primary: Nephrology

## 2018-12-21 MED ORDER — SODIUM BICARBONATE 650 MG TABLET
ORAL_TABLET | Freq: Two times a day (BID) | ORAL | 11 refills | 0.00000 days
Start: 2018-12-21 — End: 2019-12-21

## 2018-12-21 MED ORDER — NORTRIPTYLINE 10 MG CAPSULE
Freq: Every evening | ORAL | 0 days
Start: 2018-12-21 — End: 2019-03-16

## 2018-12-21 MED ORDER — ROSUVASTATIN 5 MG TABLET
ORAL_TABLET | Freq: Every day | ORAL | 3 refills | 0.00000 days
Start: 2018-12-21 — End: 2019-03-16

## 2018-12-27 DIAGNOSIS — E1142 Type 2 diabetes mellitus with diabetic polyneuropathy: Secondary | ICD-10-CM | POA: Insufficient documentation

## 2018-12-27 DIAGNOSIS — M7989 Other specified soft tissue disorders: Secondary | ICD-10-CM | POA: Insufficient documentation

## 2019-03-16 ENCOUNTER — Encounter: Admit: 2019-03-16 | Discharge: 2019-03-16 | Payer: 59

## 2019-03-16 ENCOUNTER — Encounter: Admit: 2019-03-16 | Discharge: 2019-03-16 | Payer: 59 | Attending: Nephrology | Primary: Nephrology

## 2019-03-16 DIAGNOSIS — Z940 Kidney transplant status: Principal | ICD-10-CM

## 2019-03-16 DIAGNOSIS — Z79899 Other long term (current) drug therapy: Secondary | ICD-10-CM

## 2019-03-16 DIAGNOSIS — Z1159 Encounter for screening for other viral diseases: Secondary | ICD-10-CM

## 2019-03-16 DIAGNOSIS — R911 Solitary pulmonary nodule: Secondary | ICD-10-CM

## 2019-03-16 DIAGNOSIS — Z114 Encounter for screening for human immunodeficiency virus [HIV]: Secondary | ICD-10-CM

## 2019-03-16 MED ORDER — ROSUVASTATIN 5 MG TABLET
ORAL_TABLET | Freq: Every day | ORAL | 3 refills | 90 days
Start: 2019-03-16 — End: 2020-03-15

## 2019-03-16 MED ORDER — NORTRIPTYLINE 10 MG CAPSULE
ORAL_CAPSULE | 0 days
Start: 2019-03-16 — End: ?

## 2019-03-16 MED ORDER — POLYETHYLENE GLYCOL 3350 17 GRAM/DOSE ORAL POWDER
Freq: Every day | ORAL | 0 refills | 15.00000 days | Status: CP
Start: 2019-03-16 — End: ?

## 2019-03-16 MED ORDER — MYCOPHENOLATE SODIUM 180 MG TABLET,DELAYED RELEASE
ORAL_TABLET | 10 refills | 0 days
Start: 2019-03-16 — End: 2020-03-15

## 2019-04-17 DIAGNOSIS — Z940 Kidney transplant status: Secondary | ICD-10-CM

## 2019-04-17 DIAGNOSIS — Z79899 Other long term (current) drug therapy: Secondary | ICD-10-CM

## 2019-06-21 DIAGNOSIS — Z940 Kidney transplant status: Principal | ICD-10-CM

## 2019-06-21 DIAGNOSIS — Z79899 Other long term (current) drug therapy: Principal | ICD-10-CM

## 2019-06-23 ENCOUNTER — Encounter: Admit: 2019-06-23 | Discharge: 2019-06-23 | Payer: 59 | Attending: Nephrology | Primary: Nephrology

## 2019-06-23 ENCOUNTER — Encounter: Admit: 2019-06-23 | Discharge: 2019-06-23 | Payer: 59

## 2019-07-11 DIAGNOSIS — Z940 Kidney transplant status: Principal | ICD-10-CM

## 2019-07-11 MED ORDER — MYCOPHENOLATE SODIUM 180 MG TABLET,DELAYED RELEASE
ORAL_TABLET | Freq: Two times a day (BID) | ORAL | 11 refills | 30.00000 days | Status: CP
Start: 2019-07-11 — End: 2020-07-10

## 2019-08-11 DIAGNOSIS — J381 Polyp of vocal cord and larynx: Secondary | ICD-10-CM

## 2019-08-11 HISTORY — DX: Polyp of vocal cord and larynx: J38.1

## 2019-08-25 DIAGNOSIS — Z940 Kidney transplant status: Principal | ICD-10-CM

## 2019-08-25 MED ORDER — MYCOPHENOLATE SODIUM 180 MG TABLET,DELAYED RELEASE
ORAL_TABLET | Freq: Two times a day (BID) | ORAL | 11 refills | 30.00000 days
Start: 2019-08-25 — End: 2020-08-24

## 2019-10-06 DIAGNOSIS — Z940 Kidney transplant status: Principal | ICD-10-CM

## 2019-10-06 DIAGNOSIS — Z79899 Other long term (current) drug therapy: Principal | ICD-10-CM

## 2019-10-10 DIAGNOSIS — Z940 Kidney transplant status: Principal | ICD-10-CM

## 2019-10-10 DIAGNOSIS — Z79899 Other long term (current) drug therapy: Principal | ICD-10-CM

## 2019-10-11 ENCOUNTER — Encounter: Admit: 2019-10-11 | Discharge: 2019-10-12 | Payer: 59 | Attending: Nephrology | Primary: Nephrology

## 2019-10-11 ENCOUNTER — Encounter: Admit: 2019-10-11 | Discharge: 2019-10-12 | Payer: 59

## 2019-10-19 DIAGNOSIS — Z940 Kidney transplant status: Principal | ICD-10-CM

## 2019-10-19 MED ORDER — PROGRAF 1 MG CAPSULE
ORAL_CAPSULE | 11 refills | 0 days | Status: CP
Start: 2019-10-19 — End: ?

## 2019-11-28 DIAGNOSIS — Z79899 Other long term (current) drug therapy: Principal | ICD-10-CM

## 2019-11-28 DIAGNOSIS — Z940 Kidney transplant status: Principal | ICD-10-CM

## 2019-11-30 ENCOUNTER — Encounter: Admit: 2019-11-30 | Discharge: 2019-12-01 | Payer: 59 | Attending: Nephrology | Primary: Nephrology

## 2019-11-30 DIAGNOSIS — Z940 Kidney transplant status: Principal | ICD-10-CM

## 2019-11-30 DIAGNOSIS — R0789 Other chest pain: Principal | ICD-10-CM

## 2019-11-30 DIAGNOSIS — Z79899 Other long term (current) drug therapy: Principal | ICD-10-CM

## 2019-11-30 DIAGNOSIS — R32 Unspecified urinary incontinence: Principal | ICD-10-CM

## 2019-11-30 MED ORDER — GABAPENTIN 100 MG CAPSULE
ORAL_CAPSULE | Freq: Three times a day (TID) | ORAL | 4 refills | 30.00000 days | Status: CP
Start: 2019-11-30 — End: 2020-11-29

## 2019-11-30 MED ORDER — OMEPRAZOLE 40 MG CAPSULE,DELAYED RELEASE
ORAL_CAPSULE | Freq: Every day | ORAL | 11 refills | 60 days | Status: CP
Start: 2019-11-30 — End: 2020-11-29

## 2019-12-28 ENCOUNTER — Telehealth: Payer: Self-pay | Admitting: Cardiovascular Disease

## 2019-12-28 NOTE — Telephone Encounter (Signed)
Patient is with [facility] program - recall no longer needed  Recall deleted

## 2020-01-03 DIAGNOSIS — Z940 Kidney transplant status: Principal | ICD-10-CM

## 2020-01-03 MED ORDER — PROGRAF 1 MG CAPSULE
ORAL_CAPSULE | ORAL | 11 refills | 0.00000 days | Status: CP
Start: 2020-01-03 — End: ?

## 2020-01-03 MED ORDER — PREDNISONE 5 MG TABLET
ORAL_TABLET | Freq: Every day | ORAL | 3 refills | 90.00000 days | Status: CP
Start: 2020-01-03 — End: ?

## 2020-01-09 MED ORDER — SHINGRIX (PF) 50 MCG/0.5 ML INTRAMUSCULAR SUSPENSION, KIT
INTRAMUSCULAR | 0 days
Start: 2020-01-09 — End: ?

## 2020-02-06 DIAGNOSIS — R911 Solitary pulmonary nodule: Principal | ICD-10-CM

## 2020-02-06 DIAGNOSIS — Z79899 Other long term (current) drug therapy: Principal | ICD-10-CM

## 2020-02-06 DIAGNOSIS — Z940 Kidney transplant status: Principal | ICD-10-CM

## 2020-02-08 DIAGNOSIS — Z940 Kidney transplant status: Principal | ICD-10-CM

## 2020-02-08 DIAGNOSIS — Z79899 Other long term (current) drug therapy: Principal | ICD-10-CM

## 2020-02-14 ENCOUNTER — Ambulatory Visit: Admit: 2020-02-14 | Discharge: 2020-02-15 | Payer: 59

## 2020-02-14 ENCOUNTER — Ambulatory Visit: Admit: 2020-02-14 | Discharge: 2020-02-15 | Payer: 59 | Attending: Nephrology | Primary: Nephrology

## 2020-02-14 DIAGNOSIS — M542 Cervicalgia: Principal | ICD-10-CM

## 2020-02-14 DIAGNOSIS — Z79899 Other long term (current) drug therapy: Principal | ICD-10-CM

## 2020-02-14 DIAGNOSIS — M79642 Pain in left hand: Principal | ICD-10-CM

## 2020-02-14 DIAGNOSIS — Z940 Kidney transplant status: Principal | ICD-10-CM

## 2020-02-20 MED ORDER — ASPERCREME WITH ALOE 10 % TOPICAL
0 days
Start: 2020-02-20 — End: ?

## 2020-03-19 ENCOUNTER — Ambulatory Visit: Admit: 2020-03-19 | Discharge: 2020-03-20 | Payer: 59

## 2020-03-19 ENCOUNTER — Encounter: Admit: 2020-03-19 | Discharge: 2020-03-20 | Payer: 59

## 2020-03-20 DIAGNOSIS — Z940 Kidney transplant status: Principal | ICD-10-CM

## 2020-03-20 DIAGNOSIS — R9389 Abnormal findings on diagnostic imaging of other specified body structures: Principal | ICD-10-CM

## 2020-03-25 ENCOUNTER — Ambulatory Visit
Admit: 2020-03-25 | Discharge: 2020-03-28 | Disposition: A | Discharge status: Home / Self Care | Payer: 59 | Source: Ambulatory Visit | Admitting: Infectious Disease

## 2020-03-25 ENCOUNTER — Ambulatory Visit: Admit: 2020-03-25 | Payer: PRIVATE HEALTH INSURANCE | Attending: Infectious Disease | Primary: Infectious Disease

## 2020-03-25 DIAGNOSIS — J189 Pneumonia, unspecified organism: Secondary | ICD-10-CM | POA: Insufficient documentation

## 2020-03-27 HISTORY — PX: BRONCHOSCOPY: SUR163

## 2020-03-28 MED ORDER — DICLOFENAC 1 % TOPICAL GEL
Freq: Four times a day (QID) | TOPICAL | 0 refills | 13.00000 days | Status: CP | PRN
Start: 2020-03-28 — End: 2021-03-28

## 2020-03-28 MED ORDER — METOPROLOL TARTRATE 50 MG TABLET
ORAL_TABLET | Freq: Two times a day (BID) | ORAL | 0 refills | 30.00000 days | Status: CP
Start: 2020-03-28 — End: 2020-04-27

## 2020-03-28 MED ORDER — TACROLIMUS 1 MG CAPSULE, IMMEDIATE-RELEASE
ORAL_CAPSULE | Freq: Two times a day (BID) | ORAL | 11 refills | 30 days | Status: CP
Start: 2020-03-28 — End: 2021-03-28

## 2020-03-28 MED ORDER — CYCLOBENZAPRINE 5 MG TABLET
ORAL_TABLET | Freq: Three times a day (TID) | ORAL | 0 refills | 20 days | Status: CP | PRN
Start: 2020-03-28 — End: ?

## 2020-03-28 MED ORDER — IPRATROPIUM 0.5 MG-ALBUTEROL 3 MG (2.5 MG BASE)/3 ML NEBULIZATION SOLN
Freq: Four times a day (QID) | RESPIRATORY_TRACT | 0 refills | 14.00000 days | Status: CP
Start: 2020-03-28 — End: 2020-04-11

## 2020-04-03 ENCOUNTER — Ambulatory Visit: Admit: 2020-04-03 | Discharge: 2020-04-04 | Payer: 59 | Attending: Geriatric Medicine | Primary: Geriatric Medicine

## 2020-04-03 DIAGNOSIS — J449 Chronic obstructive pulmonary disease, unspecified: Principal | ICD-10-CM

## 2020-04-03 DIAGNOSIS — Z940 Kidney transplant status: Principal | ICD-10-CM

## 2020-04-05 ENCOUNTER — Other Ambulatory Visit: Payer: Self-pay

## 2020-04-05 ENCOUNTER — Observation Stay
Admission: EM | Admit: 2020-04-05 | Discharge: 2020-04-06 | Disposition: A | Discharge status: Home / Self Care | Payer: Medicare (Managed Care) | Attending: Internal Medicine | Admitting: Internal Medicine

## 2020-04-05 ENCOUNTER — Emergency Department: Payer: Medicare (Managed Care)

## 2020-04-05 DIAGNOSIS — N186 End stage renal disease: Secondary | ICD-10-CM | POA: Insufficient documentation

## 2020-04-05 DIAGNOSIS — Z20822 Contact with and (suspected) exposure to covid-19: Secondary | ICD-10-CM | POA: Insufficient documentation

## 2020-04-05 DIAGNOSIS — J449 Chronic obstructive pulmonary disease, unspecified: Secondary | ICD-10-CM | POA: Insufficient documentation

## 2020-04-05 DIAGNOSIS — Z992 Dependence on renal dialysis: Secondary | ICD-10-CM | POA: Diagnosis not present

## 2020-04-05 DIAGNOSIS — Z7982 Long term (current) use of aspirin: Secondary | ICD-10-CM | POA: Insufficient documentation

## 2020-04-05 DIAGNOSIS — I5033 Acute on chronic diastolic (congestive) heart failure: Secondary | ICD-10-CM | POA: Insufficient documentation

## 2020-04-05 DIAGNOSIS — Z87891 Personal history of nicotine dependence: Secondary | ICD-10-CM | POA: Diagnosis not present

## 2020-04-05 DIAGNOSIS — I132 Hypertensive heart and chronic kidney disease with heart failure and with stage 5 chronic kidney disease, or end stage renal disease: Secondary | ICD-10-CM | POA: Diagnosis not present

## 2020-04-05 DIAGNOSIS — Z79899 Other long term (current) drug therapy: Secondary | ICD-10-CM | POA: Diagnosis not present

## 2020-04-05 DIAGNOSIS — T783XXA Angioneurotic edema, initial encounter: Secondary | ICD-10-CM | POA: Diagnosis not present

## 2020-04-05 DIAGNOSIS — R220 Localized swelling, mass and lump, head: Secondary | ICD-10-CM | POA: Diagnosis present

## 2020-04-05 DIAGNOSIS — E119 Type 2 diabetes mellitus without complications: Secondary | ICD-10-CM | POA: Diagnosis not present

## 2020-04-05 DIAGNOSIS — Z940 Kidney transplant status: Secondary | ICD-10-CM | POA: Insufficient documentation

## 2020-04-05 LAB — BASIC METABOLIC PANEL
Anion gap: 10 (ref 5–15)
BUN: 18 mg/dL (ref 8–23)
CO2: 24 mmol/L (ref 22–32)
Calcium: 9.1 mg/dL (ref 8.9–10.3)
Chloride: 97 mmol/L — ABNORMAL LOW (ref 98–111)
Creatinine, Ser: 1.47 mg/dL — ABNORMAL HIGH (ref 0.44–1.00)
GFR calc Af Amer: 44 mL/min — ABNORMAL LOW (ref >60)
GFR calc non Af Amer: 38 mL/min — ABNORMAL LOW (ref >60)
Glucose, Bld: 95 mg/dL (ref 70–99)
Potassium: 4.7 mmol/L (ref 3.5–5.1)
Sodium: 131 mmol/L — ABNORMAL LOW (ref 135–145)

## 2020-04-05 LAB — CBC
HCT: 33.1 % — ABNORMAL LOW (ref 36.0–46.0)
Hemoglobin: 10.9 g/dL — ABNORMAL LOW (ref 12.0–15.0)
MCH: 28.4 pg (ref 26.0–34.0)
MCHC: 32.9 g/dL (ref 30.0–36.0)
MCV: 86.2 fL (ref 80.0–100.0)
Platelets: 333 10*3/uL (ref 150–400)
RBC: 3.84 MIL/uL — ABNORMAL LOW (ref 3.87–5.11)
RDW: 15.6 % — ABNORMAL HIGH (ref 11.5–15.5)
WBC: 11.2 10*3/uL — ABNORMAL HIGH (ref 4.0–10.5)
nRBC: 0 % (ref 0.0–0.2)

## 2020-04-05 LAB — SARS CORONAVIRUS 2 BY RT PCR (HOSPITAL ORDER, PERFORMED IN ~~LOC~~ HOSPITAL LAB): SARS Coronavirus 2: NEGATIVE

## 2020-04-05 LAB — TROPONIN I (HIGH SENSITIVITY): Troponin I (High Sensitivity): 7 ng/L (ref <18)

## 2020-04-05 MED ORDER — TACROLIMUS 1 MG CAPSULE, IMMEDIATE-RELEASE
ORAL_CAPSULE | Freq: Two times a day (BID) | ORAL | 11 refills | 30 days
Start: 2020-04-05 — End: 2021-04-05

## 2020-04-05 MED ORDER — DOCUSATE SODIUM 100 MG PO CAPS
100.0000 mg | ORAL_CAPSULE | Freq: Two times a day (BID) | ORAL | Status: DC
  Administered 2020-04-05 – 2020-04-06 (×2): 100 mg via ORAL
  Filled 2020-04-05 (×2): qty 1

## 2020-04-05 MED ORDER — PREDNISONE 20 MG PO TABS
40.0000 mg | ORAL_TABLET | Freq: Once | ORAL | Status: AC
  Administered 2020-04-05: 40 mg via ORAL
  Filled 2020-04-05: qty 2

## 2020-04-05 MED ORDER — MAGNESIUM OXIDE 400 (241.3 MG) MG PO TABS
200.0000 mg | ORAL_TABLET | Freq: Two times a day (BID) | ORAL | Status: DC
  Administered 2020-04-05 – 2020-04-06 (×2): 200 mg via ORAL
  Filled 2020-04-05 (×2): qty 1

## 2020-04-05 MED ORDER — ALBUTEROL SULFATE (2.5 MG/3ML) 0.083% IN NEBU: 3.0000 mL | INHALATION_SOLUTION | Freq: Four times a day (QID) | RESPIRATORY_TRACT | Status: DC | PRN

## 2020-04-05 MED ORDER — NORTRIPTYLINE HCL 10 MG PO CAPS: 10.0000 mg | ORAL_CAPSULE | ORAL | Status: DC

## 2020-04-05 MED ORDER — MOMETASONE FURO-FORMOTEROL FUM 200-5 MCG/ACT IN AERO
2.0000 | INHALATION_SPRAY | Freq: Two times a day (BID) | RESPIRATORY_TRACT | Status: DC
  Administered 2020-04-05 – 2020-04-06 (×2): 2 via RESPIRATORY_TRACT
  Filled 2020-04-05: qty 8.8

## 2020-04-05 MED ORDER — POLYETHYLENE GLYCOL 3350 17 G PO PACK: 17.0000 g | PACK | Freq: Every day | ORAL | Status: DC | PRN

## 2020-04-05 MED ORDER — METOPROLOL TARTRATE 50 MG PO TABS
75.0000 mg | ORAL_TABLET | Freq: Two times a day (BID) | ORAL | Status: DC
  Administered 2020-04-05 – 2020-04-06 (×2): 75 mg via ORAL
  Filled 2020-04-05 (×2): qty 1

## 2020-04-05 MED ORDER — AMLODIPINE BESYLATE 5 MG PO TABS
5.0000 mg | ORAL_TABLET | Freq: Every day | ORAL | Status: DC
  Administered 2020-04-06: 5 mg via ORAL
  Filled 2020-04-05: qty 1

## 2020-04-05 MED ORDER — ENOXAPARIN SODIUM 40 MG/0.4ML ~~LOC~~ SOLN
40.0000 mg | SUBCUTANEOUS | Status: DC
  Administered 2020-04-05: 40 mg via SUBCUTANEOUS
  Filled 2020-04-05: qty 0.4

## 2020-04-05 MED ORDER — TACROLIMUS 1 MG PO CAPS
3.0000 mg | ORAL_CAPSULE | Freq: Two times a day (BID) | ORAL | Status: DC
  Administered 2020-04-05 – 2020-04-06 (×2): 3 mg via ORAL
  Filled 2020-04-05 (×3): qty 3

## 2020-04-05 MED ORDER — GABAPENTIN 100 MG PO CAPS
100.0000 mg | ORAL_CAPSULE | Freq: Three times a day (TID) | ORAL | Status: DC
  Administered 2020-04-05 – 2020-04-06 (×2): 100 mg via ORAL
  Filled 2020-04-05 (×5): qty 1

## 2020-04-05 MED ORDER — ONDANSETRON HCL 4 MG PO TABS: 4.0000 mg | ORAL_TABLET | Freq: Four times a day (QID) | ORAL | Status: DC | PRN

## 2020-04-05 MED ORDER — PANTOPRAZOLE SODIUM 40 MG PO TBEC
40.0000 mg | DELAYED_RELEASE_TABLET | Freq: Every day | ORAL | Status: DC
  Administered 2020-04-06: 40 mg via ORAL
  Filled 2020-04-05: qty 1

## 2020-04-05 MED ORDER — NORTRIPTYLINE HCL 10 MG PO CAPS
10.0000 mg | ORAL_CAPSULE | Freq: Every day | ORAL | Status: DC
  Administered 2020-04-06: 10 mg via ORAL
  Filled 2020-04-05: qty 1

## 2020-04-05 MED ORDER — DIPHENHYDRAMINE HCL 25 MG PO CAPS
25.0000 mg | ORAL_CAPSULE | Freq: Two times a day (BID) | ORAL | Status: DC
  Administered 2020-04-05 – 2020-04-06 (×2): 25 mg via ORAL
  Filled 2020-04-05 (×2): qty 1

## 2020-04-05 MED ORDER — ACETAMINOPHEN 650 MG RE SUPP: 650.0000 mg | Freq: Four times a day (QID) | RECTAL | Status: DC | PRN

## 2020-04-05 MED ORDER — ACETAMINOPHEN 325 MG PO TABS: 650.0000 mg | ORAL_TABLET | Freq: Four times a day (QID) | ORAL | Status: DC | PRN

## 2020-04-05 MED ORDER — PREDNISONE 20 MG PO TABS
40.0000 mg | ORAL_TABLET | Freq: Every day | ORAL | Status: DC
  Administered 2020-04-06: 40 mg via ORAL
  Filled 2020-04-05: qty 2

## 2020-04-05 MED ORDER — SODIUM BICARBONATE 650 MG PO TABS
650.0000 mg | ORAL_TABLET | Freq: Two times a day (BID) | ORAL | Status: DC
  Administered 2020-04-05 – 2020-04-06 (×2): 650 mg via ORAL
  Filled 2020-04-05 (×3): qty 1

## 2020-04-05 MED ORDER — ONDANSETRON HCL 4 MG/2ML IJ SOLN: 4.0000 mg | Freq: Four times a day (QID) | INTRAMUSCULAR | Status: DC | PRN

## 2020-04-05 MED ORDER — ASPIRIN 81 MG PO CHEW
81.0000 mg | CHEWABLE_TABLET | Freq: Every day | ORAL | Status: DC
  Administered 2020-04-06: 81 mg via ORAL
  Filled 2020-04-05: qty 1

## 2020-04-05 MED ORDER — MG-PLUS PROTEIN 133 MG PO TABS: 1.0000 | ORAL_TABLET | Freq: Two times a day (BID) | ORAL | Status: DC

## 2020-04-05 MED ORDER — FAMOTIDINE 20 MG PO TABS
10.0000 mg | ORAL_TABLET | Freq: Two times a day (BID) | ORAL | Status: DC
  Administered 2020-04-05 – 2020-04-06 (×2): 10 mg via ORAL
  Filled 2020-04-05 (×2): qty 1

## 2020-04-05 MED ORDER — NORTRIPTYLINE HCL 10 MG PO CAPS
20.0000 mg | ORAL_CAPSULE | Freq: Every day | ORAL | Status: DC
  Administered 2020-04-05: 20 mg via ORAL
  Filled 2020-04-05 (×2): qty 2

## 2020-04-05 NOTE — ED Notes (Signed)
Pt family member at bedside.

## 2020-04-05 NOTE — H&P (Signed)
[facility] [place] NAME: Jasmine Holloway    MR#:  [id number]  DATE OF BIRTH:  [birth year]  DATE OF ADMISSION:  04/05/2020  PRIMARY CARE PHYSICIAN: Theotis Burrow, MD   REQUESTING/REFERRING PHYSICIAN: Dr. Quentin Cornwall  Patient coming from : home   CHIEF COMPLAINT:  swelling of the upper lip couple days  HISTORY OF PRESENT ILLNESS:  Jasmine Holloway  is a 59 y.o. female with a known history of chronic kidney disease secondary to hypertension and polycystic kidney disease status post renal transplant on immunosuppressant meds, hypertension, diabetes, COPD with tobacco abuse comes to the emergency room with evaluation of several days upper lip swelling which was initially involving her tongue and face. Patient reports it has gotten much better and she was having applesauce and graham crackers without any difficulty during my evaluation.  She was recently admitted at [facility] August 16 for shortness of breath and no antibiotics were prescribed at discharge. It was thought to be due to COPD exacerbation. She does not take any ACE or ARB medications. Denies any new prescription for medication  according to [facility] progress note she was given third dose of Pfizer vaccine as a booster on 26th of August.. Patient says she did not have any difficulty with  her two previous doses.  ED course: patient is hemodynamically stable. She does have upper lip swelling. ER physician Dr. Quentin Cornwall spoke with [facility] transplant nephrology they recommend overnight observation.  CT soft tissue neck1. Evidence of mild generalized perioral/lip soft tissue swelling and inflammation. But otherwise negative noncontrast CT appearance of the neck.  2. Partially visible chronic lung disease suspected due to pulmonary fibrosis. Chronic reactive appearing superior mediastinal lymph nodes. Patient received prednisone 40 mg x1  no respiratory distress. Her oxygen saturation is 97-100% on  room air  Patient tells me she was set up with home oxygen during recent discharge from [facility] due to her COPD. PAST MEDICAL HISTORY:   Past Medical History:  Diagnosis Date  . Asthma   . CHF (congestive heart failure) ([place])   . COPD (chronic obstructive pulmonary disease) ([place])   . Diabetes mellitus without complication ([place])   . Diastolic heart failure ([place])   . ESRD (end stage renal disease) on dialysis ([place])   . HIV (human immunodeficiency virus infection) ([place])   . Hypertension   . Neuropathy   . Polycystic kidney disease   . Renal insufficiency     PAST SURGICAL HISTOIRY:   Past Surgical History:  Procedure Laterality Date  . ABDOMINAL HYSTERECTOMY    . ABDOMINAL SURGERY    . ARTERIOVENOUS GRAFT PLACEMENT Right    x3 (R forearm currently used for access)  . BREAST BIOPSY Left 12/03/2015   neg  . COLON SURGERY    . ESOPHAGOGASTRODUODENOSCOPY (EGD) WITH PROPOFOL N/A 02/04/2017   Procedure: ESOPHAGOGASTRODUODENOSCOPY (EGD) WITH PROPOFOL;  Surgeon: Jonathon Bellows, MD;  Location: [facility] ENDOSCOPY;  Service: Endoscopy;  Laterality: N/A;  . 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 PERIPHERAL VASCULAR CATHETERIZATION Right 08/04/2016   Procedure: A/V Shuntogram;  Surgeon: Algernon Huxley, MD;  Location: [facility];  Service: Cardiovascular;  Laterality: Right;  . VEIN HARVEST      SOCIAL HISTORY:   Social History   Tobacco Use  . Smoking status: Former Smoker    Packs/day: 0.25    Quit date: 08/11/2015    Years since quitting: 4.6  . Smokeless tobacco: Never Used  Substance Use Topics  .  Alcohol use: No    Alcohol/week: 0.0 standard drinks    FAMILY HISTORY:   Family History  Problem Relation Age of Onset  . Hypertension Brother   . Heart failure Brother   . Hypertension Mother   . Heart disease Mother     DRUG ALLERGIES:   Allergies  Allergen Reactions  . Morphine And Related Nausea And Vomiting  . Strawberry Extract   . Tramadol Nausea And Vomiting  .  Venofer [Iron Sucrose]   . Buprenorphine Hcl Nausea And Vomiting  . Morphine Nausea And Vomiting    Halllucinations    REVIEW OF SYSTEMS:  Review of Systems  Constitutional: Negative for chills, fever and weight loss.  HENT: Negative for ear discharge, ear pain and nosebleeds.   Eyes: Negative for blurred vision, pain and discharge.  Respiratory: Negative for sputum production, shortness of breath, wheezing and stridor.   Cardiovascular: Negative for chest pain, palpitations, orthopnea and PND.  Gastrointestinal: Negative for abdominal pain, diarrhea, nausea and vomiting.  Genitourinary: Negative for frequency and urgency.  Musculoskeletal: Negative for back pain and joint pain.  Neurological: Negative for sensory change, speech change, focal weakness and weakness.  Psychiatric/Behavioral: Negative for depression and hallucinations. The patient is not nervous/anxious.      MEDICATIONS AT HOME:   Prior to Admission medications   Medication Sig Start Date End Date Taking? Authorizing Provider  acetaminophen (TYLENOL) 325 MG tablet Take 2 tablets (650 mg total) by mouth every 6 (six) hours as needed for mild pain or headache. 03/18/16  Yes Gouru, Aruna, MD  amLODipine (NORVASC) 5 MG tablet Take 5 mg daily by mouth.  08/31/14  Yes [provider]  aspirin 81 MG chewable tablet Chew 1 tablet (81 mg total) by mouth daily. 01/17/16  Yes Gladstone Lighter, MD  budesonide-formoterol (SYMBICORT) 160-4.5 MCG/ACT inhaler Inhale 2 puffs into the lungs 2 (two) times daily.   Yes [provider]  docusate sodium (COLACE) 100 MG capsule Take 100 mg by mouth 2 (two) times daily.   Yes [provider]  gabapentin (NEURONTIN) 100 MG capsule Take 100 mg by mouth 3 (three) times daily.    Yes [provider]  metoprolol tartrate (LOPRESSOR) 25 MG tablet Take 75 mg by mouth 2 (two) times daily.  05/23/18  Yes [provider]  nortriptyline (PAMELOR) 10 MG capsule  Take 10-20 mg by mouth See admin instructions. Take 1 capsule (10mg ) by mouth every morning and take 2 capsules (20mg ) by mouth every night 01/09/20  Yes [provider]  omeprazole (PRILOSEC) 40 MG capsule Take 40 mg by mouth daily.    Yes [provider]  predniSONE (DELTASONE) 5 MG tablet Take 5 mg by mouth daily. 01/05/20  Yes [provider]  sodium bicarbonate 650 MG tablet Take 650 mg by mouth 2 (two) times daily.   Yes [provider]  Specialty Vitamins Products (MAGNESIUM, AMINO ACID CHELATE,) 133 MG tablet Take 1 tablet by mouth 2 (two) times daily.   Yes [provider]  tacrolimus (PROGRAF) 1 MG capsule Take 2-3 mg by mouth See admin instructions. Take 3 capsules (3mg ) by mouth every morning and take 2 capsules (2mg ) by mouth every night   Yes [provider]  VENTOLIN HFA 108 (90 Base) MCG/ACT inhaler Inhale 2 puffs into the lungs every 6 (six) hours as needed for wheezing or shortness of breath.    Yes [provider]      VITAL SIGNS:  Blood pressure Marland Kitchen)  146/85, pulse 76, temperature 98.4 F (36.9 C), temperature source Oral, resp. rate 20, height 5\' 3"  (1.6 m), weight 99.3 kg, SpO2 97 %.  PHYSICAL EXAMINATION:  GENERAL:  60 y.o.-year-old patient lying in the bed with no acute distress.    HEENT: Head atraumatic, normocephalic. Oropharynx and nasopharynx clear.     breath sounds bilaterally, no wheezing, rales,rhonchi or crepitation. No use of accessory muscles of respiration.  CARDIOVASCULAR: S1, S2 normal. No murmurs, rubs, or gallops.  ABDOMEN: Soft, nontender, nondistended. Bowel sounds present. No organomegaly or mass.  EXTREMITIES: No pedal edema, cyanosis, or clubbing.  NEUROLOGIC: Cranial nerves II through XII are intact. Muscle strength 5/5 in all extremities. Sensation intact. Gait not checked.  PSYCHIATRIC: The patient is alert and oriented x 3.  SKIN: No obvious rash, lesion, or ulcer.   LABORATORY  PANEL:   CBC Recent Labs  Lab 04/05/20 1131  WBC 11.2*  HGB 10.9*  HCT 33.1*  PLT 333   ------------------------------------------------------------------------------------------------------------------  Chemistries  Recent Labs  Lab 04/05/20 1131  NA 131*  K 4.7  CL 97*  CO2 24  GLUCOSE 95  BUN 18  CREATININE 1.47*  CALCIUM 9.1   ------------------------------------------------------------------------------------------------------------------  Cardiac Enzymes No results for input(s): TROPONINI in the last 168 hours. ------------------------------------------------------------------------------------------------------------------  RADIOLOGY:  CT Soft Tissue Neck Wo Contrast  Result Date: 04/05/2020 CLINICAL DATA:  59 year old female with recent pneumonia. Subsequent swelling of the tongue, lips and face 4 days ago, reportedly improving. On home oxygen. Kidney transplant. EXAM: CT NECK WITHOUT CONTRAST TECHNIQUE: Multidetector CT imaging of the neck was performed following the standard protocol without intravenous contrast. COMPARISON:  Neck CT without contrast 01/26/2016. FINDINGS: Pharynx and larynx: Mild motion artifact at the hypopharynx today. The larynx remains within normal limits, including the epiglottis. Pharyngeal soft tissue contours are within normal limits. Noncontrast parapharyngeal and retropharyngeal spaces appear normal. Salivary glands: Negative noncontrast sublingual space, submandibular glands, parotid glands. The oral tongue appears stable and within normal limits. Thyroid: Stable, negative. Lymph nodes: Negative.  No cervical lymphadenopathy. There does appear to be perioral mild soft tissue swelling and stranding affecting the lips. But no other soft tissue inflammation identified. Vascular: Vascular patency is not evaluated in the absence of IV contrast. Mild calcified atherosclerosis at the right carotid bifurcation. Limited intracranial: Negative.  Visualized orbits: Negative. Mastoids and visualized paranasal sinuses: Chronic right lamina papyracea fracture. Visualized paranasal sinuses and mastoids are clear. Skeleton: Absent maxillary and posterior mandible dentition. No acute dental finding identified. Chronic disc and endplate degeneration in the cervical spine. No acute osseous abnormality identified. Upper chest: Chronic upper lobe lung disease with septal and interstitial thickening. Allowing for lower lung volumes today, the right lung appears stable since 2017, including a 7 mm right upper lobe lung nodule on series 4, image 95. However, apical nodularity on the left is new (series 4, image 67) although most resembles scarring. Mildly enlarged superior mediastinal nodes (right paratracheal 11 mm lymph node series 2, image 73, 12 mm prevascular node image 90) are stable since 2017. IMPRESSION: 1. Evidence of mild generalized perioral/lip soft tissue swelling and inflammation. But otherwise negative noncontrast CT appearance of the neck. 2. Partially visible chronic lung disease suspected due to pulmonary fibrosis. Chronic reactive appearing superior mediastinal lymph nodes. Electronically Signed   By: Genevie Ann M.D.   On: 04/05/2020 13:55   DG Chest Portable 1 View  Result Date: 04/05/2020 CLINICAL DATA:  Chest pain. Oral and perioral swelling. Recent pneumonia. EXAM: PORTABLE CHEST  1 VIEW COMPARISON:  11/08/2017 FINDINGS: Abnormal interstitial accentuation throughout the lungs with patchy nodularity diffusely. Mild central airway thickening. Mild enlargement of the cardiopericardial silhouette. Tortuous thoracic aorta. Clips project along the left axilla and left upper arm, as before. IMPRESSION: 1. Bilateral interstitial accentuation with patchy nodularity in the lungs, possibly from bilateral atypical infection. Chest CT may provide complementary diagnostic information. 2. Mild cardiomegaly. Electronically Signed   By: Van Clines M.D.    On: 04/05/2020 12:40    EKG:    IMPRESSION AND PLAN:  Jasmine Holloway  is a 59 y.o. female with a known history of chronic kidney disease secondary to hypertension and polycystic kidney disease status post renal transplant on immunosuppressant meds, hypertension, diabetes, COPD with tobacco abuse comes to the emergency room with evaluation of several days upper lip swelling which was initially involving her tongue and face.  1. Angioedema-- unclear etiology -admit to [facility] for overnight observation -prednisone 40 mg daily taper by 5 mg and then continue 5 mg daily (for chronic immunosuppression) -Benadryl 25 mg BID, Pepcid 10 mg BID -monitor signs of respiratory distress  2. chronic kidney disease status post renal transplant -continue Prograf 2 mg BID (per [facility] Dr Oval Linsey aug 25th visit) and prednisone 5 mg qd  3. Chronic COPD on 2 L nasal cannula oxygen -stable -sats 97% on room air -as needed bronchodilator  4. Peripheral neuropathy continue gabapentin  5. Hypertension  continue metoprolol and amlodipine   Family Communication : none Consults : none Code Status : full DVT prophylaxis : Lovenox  TOTAL TIME TAKING CARE OF THIS PATIENT: *45* minutes.    Fritzi Mandes M.D  [facility]     CC: Primary care physician; Alene Mires, Elyse Jarvis, MD

## 2020-04-05 NOTE — ED Notes (Signed)
Pt transported to xray 

## 2020-04-05 NOTE — ED Notes (Signed)
Pt independently ambulated to restroom. Pt with obvious exertional dyspnea, tachypnea, saturation 87%. Returned pt to bed, placed back on O2 @ 2L, saturations improved to 97%.

## 2020-04-05 NOTE — ED Provider Notes (Signed)
[facility] Emergency Department Provider Note    First MD Initiated Contact with Patient 04/05/20 1118     (approximate)  I have reviewed the triage vital signs and the nursing notes.   HISTORY  Chief Complaint Allergic Reaction    HPI Jasmine Holloway is a 59 y.o. female below listed past medical history presents to the ER for evaluation of several days of upper lip swelling was initially involving her tongue and face.  Does not feel like it is rapidly getting worse.  Denies any pain at this time.  Has been having some shortness of breath and chest discomfort for several weeks.  Was recently admitted to [facility] diagnosed with possible pneumonia but no antibiotics reportedly withheld pending infectious disease work-up.  She has been compliant with her meds.  She does not take any ACE or ARB medications.    Past Medical History:  Diagnosis Date  . Asthma   . CHF (congestive heart failure) ([place])   . COPD (chronic obstructive pulmonary disease) ([place])   . Diabetes mellitus without complication ([place])   . Diastolic heart failure ([place])   . ESRD (end stage renal disease) on dialysis ([place])   . HIV (human immunodeficiency virus infection) ([place])   . Hypertension   . Neuropathy   . Polycystic kidney disease   . Renal insufficiency    Family History  Problem Relation Age of Onset  . Hypertension Brother   . Heart failure Brother   . Hypertension Mother   . Heart disease Mother    Past Surgical History:  Procedure Laterality Date  . ABDOMINAL HYSTERECTOMY    . ABDOMINAL SURGERY    . ARTERIOVENOUS GRAFT PLACEMENT Right    x3 (R forearm currently used for access)  . BREAST BIOPSY Left 12/03/2015   neg  . COLON SURGERY    . ESOPHAGOGASTRODUODENOSCOPY (EGD) WITH PROPOFOL N/A 02/04/2017   Procedure: ESOPHAGOGASTRODUODENOSCOPY (EGD) WITH PROPOFOL;  Surgeon: Jonathon Bellows, MD;  Location: [facility] ENDOSCOPY;  Service: Endoscopy;  Laterality: N/A;  . KIDNEY TRANSPLANT  Right 01/08/2018  . PERIPHERAL VASCULAR CATHETERIZATION Right 08/04/2016   Procedure: A/V Shuntogram;  Surgeon: Algernon Huxley, MD;  Location: [place] [facility];  Service: Cardiovascular;  Laterality: Right;  . VEIN HARVEST     Patient Active Problem List   Diagnosis Date Noted  . Kidney transplanted 03/30/2018  . Palpitations 12/08/2017  . Accelerated hypertension 09/07/2017  . Lymphedema 05/11/2017  . Diabetes ([place]) 02/10/2017  . Esophageal stricture   . COPD (chronic obstructive pulmonary disease) ([place]) 08/06/2016  . Hypertensive crisis 08/06/2016  . Chronic pain syndrome 06/08/2016  . Anemia 06/08/2016  . Epigastric pain 03/28/2016  . Chronic diastolic heart failure ([place]) 02/04/2016  . Hoarseness of voice 02/04/2016  . Acute on chronic diastolic CHF (congestive heart failure) ([place]) 12/22/2015  . Essential hypertension 12/22/2015  . Mixed hyperlipidemia 12/22/2015  . Arm pain, left 05/31/2015  . Closed bilateral ankle fractures, with routine healing, subsequent encounter 07/24/2014  . Closed bilateral ankle fractures, initial encounter 06/20/2014      Prior to Admission medications   Medication Sig Start Date End Date Taking? Authorizing Provider  acetaminophen (TYLENOL) 325 MG tablet Take 2 tablets (650 mg total) by mouth every 6 (six) hours as needed for mild pain or headache. 03/18/16   Gouru, Illene Silver, MD  amLODipine (NORVASC) 5 MG tablet Take 5 mg daily by mouth.  08/31/14   [provider]  aspirin 81 MG chewable tablet Chew 1  tablet (81 mg total) by mouth daily. 01/17/16   Gladstone Lighter, MD  budesonide-formoterol (SYMBICORT) 160-4.5 MCG/ACT inhaler Inhale 2 puffs into the lungs 2 (two) times daily.    [provider]  metoprolol tartrate (LOPRESSOR) 25 MG tablet Take 75 mg by mouth 2 (two) times daily.  05/23/18   [provider]  mycophenolate (MYFORTIC) 180 MG EC tablet Take 540 mg by mouth 2 (two) times daily.    [provider]    omeprazole (PRILOSEC) 40 MG capsule Take 40 mg by mouth 2 (two) times daily.  06/07/18   [provider]  pregabalin (LYRICA) 75 MG capsule Take 75 mg by mouth 2 (two) times daily.    [provider]  rosuvastatin (CRESTOR) 5 MG tablet Take 5 mg by mouth daily at 6 PM.  05/09/18   [provider]  tacrolimus (PROGRAF) 1 MG capsule Take 5 mg by mouth 2 (two) times daily.    [provider]  traMADol (ULTRAM) 50 MG tablet Take 50 mg by mouth every 6 (six) hours as needed.    [provider]  VENTOLIN HFA 108 (90 Base) MCG/ACT inhaler Inhale 2 puffs into the lungs daily as needed.  05/21/16   [provider]    Allergies Morphine and related, Strawberry extract, Tramadol, Venofer [iron sucrose], Buprenorphine hcl, and Morphine    Social History Social History   Tobacco Use  . Smoking status: Former Smoker    Packs/day: 0.25    Quit date: 08/11/2015    Years since quitting: 4.6  . Smokeless tobacco: Never Used  Vaping Use  . Vaping Use: Never used  Substance Use Topics  . Alcohol use: No    Alcohol/week: 0.0 standard drinks  . Drug use: No    Review of Systems Patient denies headaches, rhinorrhea, blurry vision, numbness, shortness of breath, chest pain, edema, cough, abdominal pain, nausea, vomiting, diarrhea, dysuria, fevers, rashes or hallucinations unless otherwise stated above in HPI. ____________________________________________   PHYSICAL EXAM:  VITAL SIGNS: Vitals:   04/05/20 1124 04/05/20 1130  BP: (!) 146/85   Pulse:  76  Resp: 20   Temp: 98.4 F (36.9 C)   SpO2:  97%    Constitutional: Alert and oriented.  Eyes: Conjunctivae are normal.  Head: Atraumatic. Nose: No congestion/rhinnorhea. Mouth/Throat: Mucous membranes are moist.   Neck: No stridor. Painless ROM.  Cardiovascular: Normal rate, regular rhythm. Grossly normal heart sounds.  Good peripheral circulation. Respiratory: Normal respiratory effort.   No retractions. Lungs CTAB. Gastrointestinal: Soft and nontender. No distention. No abdominal bruits. No CVA tenderness. Genitourinary:  Musculoskeletal: No lower extremity tenderness nor edema.  No joint effusions. Neurologic:  Normal speech and language. No gross focal neurologic deficits are appreciated. No facial droop Skin:  Skin is warm, dry and intact. No rash noted. Psychiatric: Mood and affect are normal. Speech and behavior are normal.  ____________________________________________   LABS (all labs ordered are listed, but only abnormal results are displayed)  Results for orders placed or performed during the hospital encounter of 04/05/20 (from the past 24 hour(s))  Troponin I (High Sensitivity)     Status: None   Collection Time: 04/05/20 11:30 AM  Result Value Ref Range   Troponin I (High Sensitivity) 7 <18 ng/L  CBC     Status: Abnormal   Collection Time: 04/05/20 11:31 AM  Result Value Ref Range   WBC 11.2 (H) 4.0 - 10.5 K/uL   RBC 3.84 (L) 3.87 - 5.11 MIL/uL  Hemoglobin 10.9 (L) 12.0 - 15.0 g/dL   HCT 33.1 (L) 36 - 46 %   MCV 86.2 80.0 - 100.0 fL   MCH 28.4 26.0 - 34.0 pg   MCHC 32.9 30.0 - 36.0 g/dL   RDW 15.6 (H) 11.5 - 15.5 %   Platelets 333 150 - 400 K/uL   nRBC 0.0 0.0 - 0.2 %  Basic metabolic panel     Status: Abnormal   Collection Time: 04/05/20 11:31 AM  Result Value Ref Range   Sodium 131 (L) 135 - 145 mmol/L   Potassium 4.7 3.5 - 5.1 mmol/L   Chloride 97 (L) 98 - 111 mmol/L   CO2 24 22 - 32 mmol/L   Glucose, Bld 95 70 - 99 mg/dL   BUN 18 8 - 23 mg/dL   Creatinine, Ser 1.47 (H) 0.44 - 1.00 mg/dL   Calcium 9.1 8.9 - 10.3 mg/dL   GFR calc non Af Amer 38 (L) >60 mL/min   GFR calc Af Amer 44 (L) >60 mL/min   Anion gap 10 5 - 15   ____________________________________________  EKG My review and personal interpretation at Time: 11:35   Indication: sob  Rate: 80  Rhythm: sinus Axis: normal Other: normal intervals, no  stemi ____________________________________________  RADIOLOGY  I personally reviewed all radiographic images ordered to evaluate for the above acute complaints and reviewed radiology reports and findings.  These findings were personally discussed with the patient.  Please see medical record for radiology report.  ____________________________________________   PROCEDURES  Procedure(s) performed:  Procedures    Critical Care performed: no ____________________________________________   INITIAL IMPRESSION / ASSESSMENT AND PLAN / ED COURSE  Pertinent labs & imaging results that were available during my care of the patient were reviewed by me and considered in my medical decision making (see chart for details).   DDX: Angioedema, cellulitis, abscess, Ludwigs, medication reaction, glossitis  Kiasia Chou is a 59 y.o. who presents to the ED with symptoms as described above.  Patient nontoxic-appearing seems to be having the swelling has been ongoing for several days now.  Appears clinically consistent with angioedema no stridor.  No posterior pharyngeal swelling.  I do not appreciate any findings to suggest Ludwick's.  No overlying cellulitis.  Will check blood work.  Will observe in the ER.  Clinical Course as of Apr 05 1458  Fri Apr 05, 2020  1243 Patient's work-up thus far is reassuring.  Is hemodynamically stable.  Patient's niece is at her bedside and states that she also feels like the swelling has gotten better.  Uncertain etiology.  I am going to order CT imaging to exclude mass or evidence of deep space edema or infection.  Will be done without contrast as the patient has borderline renal function and is a transplant patient.   [PR]  6767 CT imaging is reassuring. She otherwise remains hemodynamically stable. I do believe that his edema seems to be improving. I will reach out to her transplant coordinator team to see if there is any of these medications that she is on that need to be  changed or if there is any contraindication to putting on dose of steroids.   [PR]  2094 Discussed the case with transplant coronary with [facility] Dr. Gwynneth Albright who did not identify any specific offending agents on the patient's MAR.  Has recommended 24-hour observation.  Has not recommended with holding any of her immunologic medications.  Agreeable to starting prednisone and typical management of angioedema.  Will continue observe.  Case discussed with hospitalist.   [PR]    Clinical Course User Index [PR] Merlyn Lot, MD    The patient was evaluated in Emergency Department today for the symptoms described in the history of present illness. He/she was evaluated in the context of the global COVID-19 pandemic, which necessitated consideration that the patient might be at risk for infection with the SARS-CoV-2 virus that causes COVID-19. Institutional protocols and algorithms that pertain to the evaluation of patients at risk for COVID-19 are in a state of rapid change based on information released by regulatory bodies including the CDC and federal and state organizations. These policies and algorithms were followed during the patient's care in the ED.  As part of my medical decision making, I reviewed the following data within the [place] notes reviewed and incorporated, Labs reviewed, notes from prior ED visits and [place] Controlled Substance Database   ____________________________________________   FINAL CLINICAL IMPRESSION(S) / ED DIAGNOSES  Final diagnoses:  Angioedema, initial encounter      NEW MEDICATIONS STARTED DURING THIS VISIT:  New Prescriptions   No medications on file     Note:  This document was prepared using Dragon voice recognition software and may include unintentional dictation errors.    Merlyn Lot, MD 04/05/20 1459

## 2020-04-05 NOTE — ED Notes (Signed)
Pt transported to CT ?

## 2020-04-05 NOTE — ED Notes (Signed)
Pt given apple sauce and graham crackers at this time. Given ok by MD to provide food.

## 2020-04-05 NOTE — ED Notes (Signed)
[facility]  CONSULT  LINE  CALLED PER  DR  Quentin Cornwall MD

## 2020-04-05 NOTE — ED Notes (Signed)
This RN attempted IV access x2 without success.

## 2020-04-05 NOTE — ED Triage Notes (Signed)
Pt to ED via [facility] from home. Per EMS pt was seen last week at [facility] for pneumonia. EMS stating Monday pt started noticing swelling in her tongue, lips and face. Pt stating swelling has gotten better since Monday but still feels her lips are swollen. Pt states CP and SOB  for a few weeks. Pt stating uses 2L oxygen at home chronically.   Pt with hx COPD, CHF and 85yr post-op kidney transplant.

## 2020-04-06 DIAGNOSIS — Z940 Kidney transplant status: Secondary | ICD-10-CM | POA: Diagnosis not present

## 2020-04-06 DIAGNOSIS — T783XXA Angioneurotic edema, initial encounter: Secondary | ICD-10-CM | POA: Diagnosis not present

## 2020-04-06 MED ORDER — DIPHENHYDRAMINE 25 MG CAPSULE
Freq: Every day | ORAL | 0 days
Start: 2020-04-06 — End: 2020-04-11

## 2020-04-06 MED ORDER — FAMOTIDINE 10 MG PO TABS: 10.0000 mg | ORAL_TABLET | Freq: Two times a day (BID) | ORAL | 0 refills | Status: DC

## 2020-04-06 MED ORDER — DIPHENHYDRAMINE HCL 25 MG PO CAPS: 25.0000 mg | ORAL_CAPSULE | Freq: Two times a day (BID) | ORAL | 0 refills | Status: DC

## 2020-04-06 NOTE — ED Notes (Signed)
Pt ambulated to the bathroom.  

## 2020-04-06 NOTE — Discharge Summary (Signed)
Physician Discharge Summary  Patient ID: Jasmine Holloway MRN: [id number] DOB/AGE: February 27, 1961 59 y.o.  Admit date: 04/05/2020 Discharge date: 04/06/2020  Admission Diagnoses:  Discharge Diagnoses:  Active Problems:   Angioedema   Discharged Condition: good  Hospital Course:  Jasmine Holloway  is a 60 y.o. female with a known history of chronic kidney disease secondary to hypertension and polycystic kidney disease status post renal transplant on immunosuppressant meds, hypertension, diabetes, COPD with tobacco abuse comes to the emergency room with evaluation of several days upper lip swelling which was initially involving her tongue and face. Patient reports it has gotten much better and she was having applesauce and graham crackers without any difficulty during my evaluation.  She was recently admitted at [facility] August 16 for shortness of breath and no antibiotics were prescribed at discharge. It was thought to be due to COPD exacerbation. She does not take any ACE or ARB medications. Denies any new prescription for medication.  He did receive the third dose of Covid vaccine on August 26.  Patient was kept in hospital overnight, she received Benadryl and Pepcid.  Continued on steroids.  Her condition part resolved today.  She no longer has any lip or tongue swelling.  She is medically stable to be discharged.  She will be followed by her family doctor as well as her transplant doctor in the near future.  #1.  Angioedema. Etiology still unclear.  Condition had improved.  2.  Chronic kidney disease status post renal transplant. Continue current immunosuppressants.  3.  COPD. Stable.  4.  Essential hypertension. Resume all home medicines.  Consults: None  Significant Diagnostic Studies:   Treatments: Pepcid and Benadryl  Discharge Exam: Blood pressure 129/83, pulse 64, temperature 97.9 F (36.6 C), temperature source Oral, resp. rate (!) 23, height 5\' 3"  (1.6 m), weight 99.3 kg, SpO2 92  %. General appearance: alert and cooperative Resp: clear to auscultation bilaterally Cardio: regular rate and rhythm, S1, S2 normal, no murmur, click, rub or gallop GI: soft, non-tender; bowel sounds normal; no masses,  no organomegaly Extremities: extremities normal, atraumatic, no cyanosis or edema  Disposition: Discharge disposition: 01-Home or Self Care       Discharge Instructions    Diet - low sodium heart healthy   Complete by: As directed    Increase activity slowly   Complete by: As directed      Allergies as of 04/06/2020      Reactions   Morphine And Related Nausea And Vomiting   Strawberry Extract    Tramadol Nausea And Vomiting   Venofer [iron Sucrose]    Buprenorphine Hcl Nausea And Vomiting   Morphine Nausea And Vomiting   Halllucinations      Medication List    TAKE these medications   acetaminophen 325 MG tablet Commonly known as: TYLENOL Take 2 tablets (650 mg total) by mouth every 6 (six) hours as needed for mild pain or headache.   amLODipine 5 MG tablet Commonly known as: NORVASC Take 5 mg daily by mouth.   aspirin 81 MG chewable tablet Chew 1 tablet (81 mg total) by mouth daily.   budesonide-formoterol 160-4.5 MCG/ACT inhaler Commonly known as: SYMBICORT Inhale 2 puffs into the lungs 2 (two) times daily.   diphenhydrAMINE 25 mg capsule Commonly known as: BENADRYL Take 1 capsule (25 mg total) by mouth 2 (two) times daily for 5 days.   docusate sodium 100 MG capsule Commonly known as: COLACE Take 100 mg by mouth 2 (two) times daily.  famotidine 10 MG tablet Commonly known as: PEPCID Take 1 tablet (10 mg total) by mouth 2 (two) times daily for 7 days.   gabapentin 100 MG capsule Commonly known as: NEURONTIN Take 100 mg by mouth 3 (three) times daily.   magnesium (amino acid chelate) 133 MG tablet Take 1 tablet by mouth 2 (two) times daily.   metoprolol tartrate 25 MG tablet Commonly known as: LOPRESSOR Take 75 mg by mouth 2  (two) times daily.   nortriptyline 10 MG capsule Commonly known as: PAMELOR Take 10-20 mg by mouth See admin instructions. Take 1 capsule (10mg ) by mouth every morning and take 2 capsules (20mg ) by mouth every night   omeprazole 40 MG capsule Commonly known as: PRILOSEC Take 40 mg by mouth daily.   predniSONE 5 MG tablet Commonly known as: DELTASONE Take 5 mg by mouth daily.   sodium bicarbonate 650 MG tablet Take 650 mg by mouth 2 (two) times daily.   tacrolimus 1 MG capsule Commonly known as: PROGRAF Take 2-3 mg by mouth See admin instructions. Take 3 capsules (3mg ) by mouth every morning and take 2 capsules (2mg ) by mouth every night   Ventolin HFA 108 (90 Base) MCG/ACT inhaler Generic drug: albuterol Inhale 2 puffs into the lungs every 6 (six) hours as needed for wheezing or shortness of breath.       Follow-up Information    Revelo, Elyse Jarvis, MD Follow up in 1 week(s).   Specialty: Family Medicine Contact information: [address] [place] [address] [phone]               Signed: Sharen Hones 04/06/2020, 1:19 PM

## 2020-04-08 LAB — TACROLIMUS LEVEL: Tacrolimus (FK506) - LabCorp: 3.6 ng/mL (ref 2.0–20.0)

## 2020-04-09 ENCOUNTER — Ambulatory Visit: Admit: 2020-04-09 | Discharge: 2020-04-10 | Payer: 59

## 2020-04-09 ENCOUNTER — Ambulatory Visit: Admit: 2020-04-09 | Discharge: 2020-04-10 | Payer: 59 | Attending: Pulmonary Disease | Primary: Pulmonary Disease

## 2020-04-09 DIAGNOSIS — J449 Chronic obstructive pulmonary disease, unspecified: Principal | ICD-10-CM

## 2020-04-09 DIAGNOSIS — Z940 Kidney transplant status: Principal | ICD-10-CM

## 2020-04-09 DIAGNOSIS — R9389 Abnormal findings on diagnostic imaging of other specified body structures: Principal | ICD-10-CM

## 2020-04-11 MED ORDER — PREDNISONE 20 MG TABLET
ORAL_TABLET | Freq: Every day | ORAL | 1 refills | 20.00000 days | Status: CP
Start: 2020-04-11 — End: ?

## 2020-04-11 MED ORDER — ATOVAQUONE 750 MG/5 ML ORAL SUSPENSION
Freq: Two times a day (BID) | ORAL | 0 refills | 21.00000 days | Status: CP
Start: 2020-04-11 — End: 2020-05-11

## 2020-04-12 MED ORDER — ATOVAQUONE 750 MG/5 ML ORAL SUSPENSION
0 days
Start: 2020-04-12 — End: ?

## 2020-04-30 DIAGNOSIS — J849 Interstitial pulmonary disease, unspecified: Principal | ICD-10-CM

## 2020-05-01 ENCOUNTER — Ambulatory Visit: Admit: 2020-05-01 | Discharge: 2020-05-02 | Payer: 59

## 2020-05-01 ENCOUNTER — Ambulatory Visit: Admit: 2020-05-01 | Discharge: 2020-05-02 | Payer: 59 | Attending: Internal Medicine | Primary: Internal Medicine

## 2020-05-01 ENCOUNTER — Ambulatory Visit
Admit: 2020-05-01 | Discharge: 2020-05-02 | Payer: 59 | Attending: Student in an Organized Health Care Education/Training Program | Primary: Student in an Organized Health Care Education/Training Program

## 2020-05-01 DIAGNOSIS — J449 Chronic obstructive pulmonary disease, unspecified: Principal | ICD-10-CM

## 2020-05-01 DIAGNOSIS — Z940 Kidney transplant status: Principal | ICD-10-CM

## 2020-05-01 DIAGNOSIS — T8690 Unspecified complication of unspecified transplanted organ and tissue: Principal | ICD-10-CM

## 2020-05-01 DIAGNOSIS — J849 Interstitial pulmonary disease, unspecified: Principal | ICD-10-CM

## 2020-05-01 DIAGNOSIS — J189 Pneumonia, unspecified organism: Principal | ICD-10-CM

## 2020-05-01 DIAGNOSIS — R0602 Shortness of breath: Principal | ICD-10-CM

## 2020-05-01 DIAGNOSIS — R05 Cough: Principal | ICD-10-CM

## 2020-05-03 DIAGNOSIS — Z940 Kidney transplant status: Principal | ICD-10-CM

## 2020-05-03 MED ORDER — MYCOPHENOLATE SODIUM 180 MG TABLET,DELAYED RELEASE
ORAL_TABLET | Freq: Two times a day (BID) | ORAL | 11 refills | 30.00000 days | Status: CP
Start: 2020-05-03 — End: ?

## 2020-05-14 DIAGNOSIS — Z940 Kidney transplant status: Principal | ICD-10-CM

## 2020-05-14 DIAGNOSIS — Z79899 Other long term (current) drug therapy: Principal | ICD-10-CM

## 2020-05-16 MED ORDER — CETIRIZINE 10 MG TABLET
ORAL | 0 days
Start: 2020-05-16 — End: ?

## 2020-05-17 ENCOUNTER — Ambulatory Visit: Admit: 2020-05-17 | Discharge: 2020-05-18 | Payer: 59 | Attending: Nephrology | Primary: Nephrology

## 2020-05-17 DIAGNOSIS — Z79899 Other long term (current) drug therapy: Principal | ICD-10-CM

## 2020-05-17 DIAGNOSIS — Z940 Kidney transplant status: Principal | ICD-10-CM

## 2020-05-17 MED ORDER — FAMOTIDINE 20 MG TABLET
ORAL_TABLET | Freq: Two times a day (BID) | ORAL | 1 refills | 30.00000 days
Start: 2020-05-17 — End: 2021-05-17

## 2020-05-17 MED ORDER — FUROSEMIDE 40 MG TABLET
ORAL_TABLET | Freq: Every day | ORAL | 11 refills | 30 days | Status: CP
Start: 2020-05-17 — End: 2021-05-17
  Filled 2020-05-17: qty 30, 30d supply, fill #0

## 2020-05-17 MED ORDER — GABAPENTIN 100 MG CAPSULE
ORAL_CAPSULE | Freq: Two times a day (BID) | ORAL | 11 refills | 30 days | Status: CP
Start: 2020-05-17 — End: 2021-05-17

## 2020-05-17 MED FILL — FUROSEMIDE 40 MG TABLET: 30 days supply | Qty: 30 | Fill #0 | Status: AC

## 2020-05-22 DIAGNOSIS — B348 Other viral infections of unspecified site: Secondary | ICD-10-CM | POA: Insufficient documentation

## 2020-05-22 DIAGNOSIS — N049 Nephrotic syndrome with unspecified morphologic changes: Secondary | ICD-10-CM | POA: Insufficient documentation

## 2020-05-22 DIAGNOSIS — Z940 Kidney transplant status: Principal | ICD-10-CM

## 2020-05-22 MED ORDER — PREDNISONE 20 MG TABLET
ORAL_TABLET | 1 refills | 0 days
Start: 2020-05-22 — End: ?

## 2020-06-13 ENCOUNTER — Encounter: Admit: 2020-06-13 | Discharge: 2020-06-13 | Payer: 59

## 2020-06-13 DIAGNOSIS — J189 Pneumonia, unspecified organism: Principal | ICD-10-CM

## 2020-06-13 DIAGNOSIS — R0602 Shortness of breath: Principal | ICD-10-CM

## 2020-06-13 DIAGNOSIS — J449 Chronic obstructive pulmonary disease, unspecified: Principal | ICD-10-CM

## 2020-06-13 DIAGNOSIS — R059 Cough, unspecified: Principal | ICD-10-CM

## 2020-06-13 DIAGNOSIS — T8690 Unspecified complication of unspecified transplanted organ and tissue: Principal | ICD-10-CM

## 2020-06-13 DIAGNOSIS — Z940 Kidney transplant status: Principal | ICD-10-CM

## 2020-06-17 ENCOUNTER — Encounter: Admit: 2020-06-17 | Discharge: 2020-06-18 | Payer: 59 | Attending: Internal Medicine | Primary: Internal Medicine

## 2020-06-17 DIAGNOSIS — R0602 Shortness of breath: Principal | ICD-10-CM

## 2020-06-17 DIAGNOSIS — J449 Chronic obstructive pulmonary disease, unspecified: Principal | ICD-10-CM

## 2020-06-17 DIAGNOSIS — J849 Interstitial pulmonary disease, unspecified: Principal | ICD-10-CM

## 2020-06-17 DIAGNOSIS — R059 Cough, unspecified: Principal | ICD-10-CM

## 2020-06-18 ENCOUNTER — Encounter: Admit: 2020-06-18 | Discharge: 2020-06-18 | Payer: 59

## 2020-06-18 DIAGNOSIS — Z940 Kidney transplant status: Principal | ICD-10-CM

## 2020-06-18 DIAGNOSIS — B348 Other viral infections of unspecified site: Principal | ICD-10-CM

## 2020-06-18 DIAGNOSIS — N049 Nephrotic syndrome with unspecified morphologic changes: Principal | ICD-10-CM

## 2020-06-20 ENCOUNTER — Encounter: Admit: 2020-06-20 | Discharge: 2020-06-21 | Payer: 59

## 2020-06-20 DIAGNOSIS — I7789 Other specified disorders of arteries and arterioles: Principal | ICD-10-CM

## 2020-06-20 DIAGNOSIS — R918 Other nonspecific abnormal finding of lung field: Principal | ICD-10-CM

## 2020-06-20 DIAGNOSIS — J439 Emphysema, unspecified: Principal | ICD-10-CM

## 2020-06-20 DIAGNOSIS — R059 Cough: Principal | ICD-10-CM

## 2020-06-20 DIAGNOSIS — J849 Interstitial pulmonary disease, unspecified: Principal | ICD-10-CM

## 2020-06-20 DIAGNOSIS — R0602 Shortness of breath: Principal | ICD-10-CM

## 2020-06-27 DIAGNOSIS — Z79899 Other long term (current) drug therapy: Principal | ICD-10-CM

## 2020-06-27 DIAGNOSIS — Z940 Kidney transplant status: Principal | ICD-10-CM

## 2020-06-28 ENCOUNTER — Encounter: Admit: 2020-06-28 | Discharge: 2020-06-29 | Payer: 59 | Attending: Nephrology | Primary: Nephrology

## 2020-06-28 DIAGNOSIS — Z940 Kidney transplant status: Principal | ICD-10-CM

## 2020-06-28 DIAGNOSIS — Z79899 Other long term (current) drug therapy: Principal | ICD-10-CM

## 2020-06-28 MED ORDER — TRELEGY ELLIPTA 200 MCG-62.5 MCG-25 MCG POWDER FOR INHALATION
Freq: Every day | RESPIRATORY_TRACT | 11 refills | 0.00000 days | Status: CP
Start: 2020-06-28 — End: ?

## 2020-06-28 MED ORDER — PREDNISONE 5 MG TABLET
ORAL_TABLET | Freq: Every day | ORAL | 11 refills | 30.00000 days | Status: CP
Start: 2020-06-28 — End: ?

## 2020-07-02 ENCOUNTER — Ambulatory Visit: Admit: 2020-07-02 | Discharge: 2020-07-03 | Payer: 59

## 2020-07-02 DIAGNOSIS — N049 Nephrotic syndrome with unspecified morphologic changes: Principal | ICD-10-CM

## 2020-07-02 DIAGNOSIS — Z940 Kidney transplant status: Principal | ICD-10-CM

## 2020-07-02 DIAGNOSIS — B348 Other viral infections of unspecified site: Principal | ICD-10-CM

## 2020-07-02 DIAGNOSIS — Z79899 Other long term (current) drug therapy: Principal | ICD-10-CM

## 2020-07-03 DIAGNOSIS — D849 Immunodeficiency, unspecified: Principal | ICD-10-CM

## 2020-07-03 DIAGNOSIS — R7989 Other specified abnormal findings of blood chemistry: Principal | ICD-10-CM

## 2020-07-03 DIAGNOSIS — Z940 Kidney transplant status: Principal | ICD-10-CM

## 2020-07-03 DIAGNOSIS — Z79899 Other long term (current) drug therapy: Principal | ICD-10-CM

## 2020-07-12 ENCOUNTER — Ambulatory Visit: Admit: 2020-07-12 | Discharge: 2020-07-13 | Payer: 59

## 2020-07-12 DIAGNOSIS — Z940 Kidney transplant status: Principal | ICD-10-CM

## 2020-07-12 DIAGNOSIS — D849 Immunodeficiency, unspecified: Principal | ICD-10-CM

## 2020-07-12 DIAGNOSIS — Z79899 Other long term (current) drug therapy: Principal | ICD-10-CM

## 2020-07-12 DIAGNOSIS — R7989 Other specified abnormal findings of blood chemistry: Principal | ICD-10-CM

## 2020-08-07 ENCOUNTER — Other Ambulatory Visit: Payer: Self-pay | Admitting: Family

## 2020-08-07 DIAGNOSIS — Z1231 Encounter for screening mammogram for malignant neoplasm of breast: Secondary | ICD-10-CM

## 2020-08-12 ENCOUNTER — Inpatient Hospital Stay
Admission: RE | Admit: 2020-08-12 | Discharge: 2020-08-12 | Disposition: A | Discharge status: Home / Self Care | Payer: Self-pay | Source: Ambulatory Visit | Attending: *Deleted | Admitting: *Deleted

## 2020-08-12 ENCOUNTER — Other Ambulatory Visit: Payer: Self-pay | Admitting: *Deleted

## 2020-08-12 DIAGNOSIS — Z1231 Encounter for screening mammogram for malignant neoplasm of breast: Secondary | ICD-10-CM

## 2020-08-14 DIAGNOSIS — Z79899 Other long term (current) drug therapy: Principal | ICD-10-CM

## 2020-08-14 DIAGNOSIS — Z940 Kidney transplant status: Principal | ICD-10-CM

## 2020-08-16 ENCOUNTER — Ambulatory Visit: Admit: 2020-08-16 | Discharge: 2020-08-16 | Payer: 59

## 2020-08-16 ENCOUNTER — Ambulatory Visit: Admit: 2020-08-16 | Discharge: 2020-08-16 | Payer: 59 | Attending: Nephrology | Primary: Nephrology

## 2020-08-16 DIAGNOSIS — Z992 Dependence on renal dialysis: Principal | ICD-10-CM

## 2020-08-16 DIAGNOSIS — Z8616 Personal history of COVID-19: Principal | ICD-10-CM

## 2020-08-16 DIAGNOSIS — I132 Hypertensive heart and chronic kidney disease with heart failure and with stage 5 chronic kidney disease, or end stage renal disease: Principal | ICD-10-CM

## 2020-08-16 DIAGNOSIS — N186 End stage renal disease: Principal | ICD-10-CM

## 2020-08-16 DIAGNOSIS — Z79899 Other long term (current) drug therapy: Principal | ICD-10-CM

## 2020-08-16 DIAGNOSIS — I509 Heart failure, unspecified: Principal | ICD-10-CM

## 2020-08-16 DIAGNOSIS — J440 Chronic obstructive pulmonary disease with acute lower respiratory infection: Principal | ICD-10-CM

## 2020-08-16 DIAGNOSIS — E1122 Type 2 diabetes mellitus with diabetic chronic kidney disease: Principal | ICD-10-CM

## 2020-08-16 DIAGNOSIS — Z940 Kidney transplant status: Principal | ICD-10-CM

## 2020-08-16 DIAGNOSIS — I2510 Atherosclerotic heart disease of native coronary artery without angina pectoris: Principal | ICD-10-CM

## 2020-08-16 DIAGNOSIS — Z7951 Long term (current) use of inhaled steroids: Principal | ICD-10-CM

## 2020-08-16 DIAGNOSIS — D649 Anemia, unspecified: Principal | ICD-10-CM

## 2020-08-16 DIAGNOSIS — Z87891 Personal history of nicotine dependence: Principal | ICD-10-CM

## 2020-08-16 DIAGNOSIS — J849 Interstitial pulmonary disease, unspecified: Principal | ICD-10-CM

## 2020-08-16 DIAGNOSIS — Z7982 Long term (current) use of aspirin: Principal | ICD-10-CM

## 2020-08-16 MED ORDER — FUROSEMIDE 40 MG TABLET
ORAL_TABLET | Freq: Every day | ORAL | 11 refills | 30.00000 days | Status: CP
Start: 2020-08-16 — End: 2021-08-16

## 2020-08-20 ENCOUNTER — Other Ambulatory Visit: Payer: Self-pay | Admitting: Family

## 2020-08-20 DIAGNOSIS — R928 Other abnormal and inconclusive findings on diagnostic imaging of breast: Secondary | ICD-10-CM

## 2020-09-10 MED ORDER — EUCERIN TOPICAL CREAM
0 days
Start: 2020-09-10 — End: ?

## 2020-09-16 ENCOUNTER — Encounter: Admit: 2020-09-16 | Discharge: 2020-09-16 | Payer: 59

## 2020-09-16 ENCOUNTER — Encounter: Admit: 2020-09-16 | Discharge: 2020-09-16 | Payer: 59 | Attending: Internal Medicine | Primary: Internal Medicine

## 2020-09-16 DIAGNOSIS — J849 Interstitial pulmonary disease, unspecified: Secondary | ICD-10-CM | POA: Insufficient documentation

## 2020-09-16 DIAGNOSIS — D849 Immunodeficiency, unspecified: Secondary | ICD-10-CM | POA: Insufficient documentation

## 2020-09-16 DIAGNOSIS — I509 Heart failure, unspecified: Principal | ICD-10-CM

## 2020-09-16 DIAGNOSIS — R0602 Shortness of breath: Principal | ICD-10-CM

## 2020-09-16 DIAGNOSIS — R0789 Other chest pain: Principal | ICD-10-CM

## 2020-09-16 DIAGNOSIS — J439 Emphysema, unspecified: Principal | ICD-10-CM

## 2020-09-16 DIAGNOSIS — Z940 Kidney transplant status: Principal | ICD-10-CM

## 2020-09-16 DIAGNOSIS — Z7952 Long term (current) use of systemic steroids: Principal | ICD-10-CM

## 2020-09-16 DIAGNOSIS — J449 Chronic obstructive pulmonary disease, unspecified: Principal | ICD-10-CM

## 2020-09-16 DIAGNOSIS — Z23 Encounter for immunization: Principal | ICD-10-CM

## 2020-09-16 DIAGNOSIS — Z79899 Other long term (current) drug therapy: Principal | ICD-10-CM

## 2020-09-16 DIAGNOSIS — R5382 Chronic fatigue, unspecified: Principal | ICD-10-CM

## 2020-09-16 DIAGNOSIS — Z87891 Personal history of nicotine dependence: Principal | ICD-10-CM

## 2020-09-16 DIAGNOSIS — R059 Cough: Principal | ICD-10-CM

## 2020-09-16 DIAGNOSIS — R918 Other nonspecific abnormal finding of lung field: Principal | ICD-10-CM

## 2020-09-16 DIAGNOSIS — E1142 Type 2 diabetes mellitus with diabetic polyneuropathy: Principal | ICD-10-CM

## 2020-09-16 DIAGNOSIS — I110 Hypertensive heart disease with heart failure: Principal | ICD-10-CM

## 2020-10-04 MED ORDER — DOCUSATE SODIUM 100 MG CAPSULE
0 days
Start: 2020-10-04 — End: ?

## 2020-10-04 MED ORDER — OMEPRAZOLE 40 MG CAPSULE,DELAYED RELEASE
0 days
Start: 2020-10-04 — End: ?

## 2020-10-07 DIAGNOSIS — J849 Interstitial pulmonary disease, unspecified: Principal | ICD-10-CM

## 2020-10-07 DIAGNOSIS — D849 Immunodeficiency, unspecified: Principal | ICD-10-CM

## 2020-10-10 ENCOUNTER — Ambulatory Visit
Admission: RE | Admit: 2020-10-10 | Discharge: 2020-10-10 | Disposition: A | Discharge status: Home / Self Care | Payer: Medicare (Managed Care) | Source: Ambulatory Visit | Attending: Family | Admitting: Family

## 2020-10-10 ENCOUNTER — Other Ambulatory Visit: Payer: Self-pay

## 2020-10-10 DIAGNOSIS — R928 Other abnormal and inconclusive findings on diagnostic imaging of breast: Secondary | ICD-10-CM

## 2020-10-16 ENCOUNTER — Other Ambulatory Visit: Payer: Self-pay | Admitting: Family

## 2020-10-16 DIAGNOSIS — R921 Mammographic calcification found on diagnostic imaging of breast: Secondary | ICD-10-CM

## 2020-10-16 DIAGNOSIS — R928 Other abnormal and inconclusive findings on diagnostic imaging of breast: Secondary | ICD-10-CM

## 2020-10-17 MED ORDER — PREVNAR 13 (PF) 0.5 ML INTRAMUSCULAR SYRINGE
0 days
Start: 2020-10-17 — End: ?

## 2020-10-22 ENCOUNTER — Ambulatory Visit
Admission: RE | Admit: 2020-10-22 | Discharge: 2020-10-22 | Disposition: A | Discharge status: Home / Self Care | Payer: Medicare (Managed Care) | Source: Ambulatory Visit | Attending: Family | Admitting: Family

## 2020-10-22 ENCOUNTER — Other Ambulatory Visit: Payer: Self-pay

## 2020-10-22 DIAGNOSIS — R928 Other abnormal and inconclusive findings on diagnostic imaging of breast: Secondary | ICD-10-CM | POA: Diagnosis present

## 2020-10-22 DIAGNOSIS — R921 Mammographic calcification found on diagnostic imaging of breast: Secondary | ICD-10-CM

## 2020-10-22 HISTORY — PX: BREAST BIOPSY: SHX20

## 2020-10-23 LAB — SURGICAL PATHOLOGY

## 2020-11-07 ENCOUNTER — Ambulatory Visit: Admit: 2020-11-07 | Payer: 59 | Attending: Nephrology | Primary: Nephrology

## 2020-11-11 DIAGNOSIS — Z940 Kidney transplant status: Principal | ICD-10-CM

## 2020-11-11 DIAGNOSIS — Z79899 Other long term (current) drug therapy: Principal | ICD-10-CM

## 2020-11-12 MED ORDER — SODIUM BICARBONATE 650 MG TABLET
0 days
Start: 2020-11-12 — End: ?

## 2020-11-13 ENCOUNTER — Encounter: Admit: 2020-11-13 | Discharge: 2020-11-13 | Payer: 59 | Attending: Nephrology | Primary: Nephrology

## 2020-11-13 DIAGNOSIS — Z940 Kidney transplant status: Principal | ICD-10-CM

## 2020-11-13 DIAGNOSIS — Z79899 Other long term (current) drug therapy: Principal | ICD-10-CM

## 2020-11-14 ENCOUNTER — Other Ambulatory Visit: Payer: Self-pay | Admitting: Family

## 2020-11-14 DIAGNOSIS — R490 Dysphonia: Secondary | ICD-10-CM

## 2020-11-14 MED ORDER — HYDROXYZINE HCL 10 MG TABLET
ORAL_TABLET | Freq: Two times a day (BID) | ORAL | 0 refills | 15 days | PRN
Start: 2020-11-14 — End: ?

## 2020-12-17 ENCOUNTER — Ambulatory Visit: Admit: 2020-12-17 | Discharge: 2020-12-17 | Payer: 59 | Attending: Internal Medicine | Primary: Internal Medicine

## 2020-12-17 ENCOUNTER — Ambulatory Visit: Admit: 2020-12-17 | Discharge: 2020-12-17 | Payer: 59

## 2020-12-17 DIAGNOSIS — G4739 Other sleep apnea: Principal | ICD-10-CM

## 2020-12-17 DIAGNOSIS — R0602 Shortness of breath: Principal | ICD-10-CM

## 2020-12-17 DIAGNOSIS — J849 Interstitial pulmonary disease, unspecified: Principal | ICD-10-CM

## 2020-12-17 DIAGNOSIS — R059 Cough: Principal | ICD-10-CM

## 2020-12-17 DIAGNOSIS — Z79899 Other long term (current) drug therapy: Principal | ICD-10-CM

## 2020-12-17 DIAGNOSIS — J449 Chronic obstructive pulmonary disease, unspecified: Principal | ICD-10-CM

## 2020-12-17 DIAGNOSIS — D849 Immunodeficiency, unspecified: Principal | ICD-10-CM

## 2020-12-17 DIAGNOSIS — Z940 Kidney transplant status: Principal | ICD-10-CM

## 2020-12-18 ENCOUNTER — Encounter: Admit: 2020-12-18 | Discharge: 2020-12-19 | Payer: 59 | Attending: Nephrology | Primary: Nephrology

## 2020-12-18 ENCOUNTER — Ambulatory Visit: Admit: 2020-12-18 | Discharge: 2020-12-19 | Payer: 59

## 2020-12-18 ENCOUNTER — Encounter: Admit: 2020-12-18 | Discharge: 2020-12-19 | Payer: 59

## 2020-12-18 DIAGNOSIS — Z940 Kidney transplant status: Principal | ICD-10-CM

## 2020-12-18 DIAGNOSIS — B348 Other viral infections of unspecified site: Principal | ICD-10-CM

## 2020-12-18 DIAGNOSIS — N049 Nephrotic syndrome with unspecified morphologic changes: Principal | ICD-10-CM

## 2020-12-18 DIAGNOSIS — Z79899 Other long term (current) drug therapy: Principal | ICD-10-CM

## 2020-12-19 DIAGNOSIS — Z940 Kidney transplant status: Principal | ICD-10-CM

## 2020-12-31 ENCOUNTER — Other Ambulatory Visit: Payer: Self-pay

## 2020-12-31 ENCOUNTER — Ambulatory Visit
Admission: RE | Admit: 2020-12-31 | Discharge: 2020-12-31 | Disposition: A | Discharge status: Home / Self Care | Payer: Medicare (Managed Care) | Source: Ambulatory Visit | Attending: Family | Admitting: Family

## 2020-12-31 DIAGNOSIS — R490 Dysphonia: Secondary | ICD-10-CM

## 2020-12-31 DIAGNOSIS — J101 Influenza due to other identified influenza virus with other respiratory manifestations: Secondary | ICD-10-CM | POA: Diagnosis not present

## 2021-01-01 ENCOUNTER — Encounter: Payer: Self-pay | Admitting: Emergency Medicine

## 2021-01-01 ENCOUNTER — Emergency Department: Payer: Medicare (Managed Care)

## 2021-01-01 ENCOUNTER — Other Ambulatory Visit: Payer: Self-pay

## 2021-01-01 ENCOUNTER — Inpatient Hospital Stay
Admission: EM | Admit: 2021-01-01 | Discharge: 2021-01-02 | DRG: 193 | Disposition: A | Discharge status: Home / Self Care | Payer: Medicare (Managed Care) | Attending: Internal Medicine | Admitting: Internal Medicine

## 2021-01-01 DIAGNOSIS — I10 Essential (primary) hypertension: Secondary | ICD-10-CM | POA: Diagnosis not present

## 2021-01-01 DIAGNOSIS — Z7952 Long term (current) use of systemic steroids: Secondary | ICD-10-CM

## 2021-01-01 DIAGNOSIS — Z90710 Acquired absence of both cervix and uterus: Secondary | ICD-10-CM

## 2021-01-01 DIAGNOSIS — I5032 Chronic diastolic (congestive) heart failure: Secondary | ICD-10-CM | POA: Diagnosis present

## 2021-01-01 DIAGNOSIS — Z9981 Dependence on supplemental oxygen: Secondary | ICD-10-CM

## 2021-01-01 DIAGNOSIS — J9601 Acute respiratory failure with hypoxia: Secondary | ICD-10-CM | POA: Diagnosis not present

## 2021-01-01 DIAGNOSIS — Z7951 Long term (current) use of inhaled steroids: Secondary | ICD-10-CM | POA: Diagnosis not present

## 2021-01-01 DIAGNOSIS — Z21 Asymptomatic human immunodeficiency virus [HIV] infection status: Secondary | ICD-10-CM | POA: Diagnosis present

## 2021-01-01 DIAGNOSIS — G629 Polyneuropathy, unspecified: Secondary | ICD-10-CM | POA: Diagnosis not present

## 2021-01-01 DIAGNOSIS — N1831 Chronic kidney disease, stage 3a: Secondary | ICD-10-CM | POA: Diagnosis present

## 2021-01-01 DIAGNOSIS — Z87891 Personal history of nicotine dependence: Secondary | ICD-10-CM

## 2021-01-01 DIAGNOSIS — E1122 Type 2 diabetes mellitus with diabetic chronic kidney disease: Secondary | ICD-10-CM | POA: Diagnosis present

## 2021-01-01 DIAGNOSIS — J101 Influenza due to other identified influenza virus with other respiratory manifestations: Principal | ICD-10-CM | POA: Diagnosis present

## 2021-01-01 DIAGNOSIS — Z20822 Contact with and (suspected) exposure to covid-19: Secondary | ICD-10-CM | POA: Diagnosis present

## 2021-01-01 DIAGNOSIS — J9621 Acute and chronic respiratory failure with hypoxia: Secondary | ICD-10-CM | POA: Diagnosis present

## 2021-01-01 DIAGNOSIS — Z7982 Long term (current) use of aspirin: Secondary | ICD-10-CM | POA: Diagnosis not present

## 2021-01-01 DIAGNOSIS — K219 Gastro-esophageal reflux disease without esophagitis: Secondary | ICD-10-CM | POA: Diagnosis present

## 2021-01-01 DIAGNOSIS — E782 Mixed hyperlipidemia: Secondary | ICD-10-CM | POA: Diagnosis present

## 2021-01-01 DIAGNOSIS — Z79899 Other long term (current) drug therapy: Secondary | ICD-10-CM

## 2021-01-01 DIAGNOSIS — Z6841 Body Mass Index (BMI) 40.0 and over, adult: Secondary | ICD-10-CM

## 2021-01-01 DIAGNOSIS — J441 Chronic obstructive pulmonary disease with (acute) exacerbation: Secondary | ICD-10-CM | POA: Diagnosis present

## 2021-01-01 DIAGNOSIS — Z91018 Allergy to other foods: Secondary | ICD-10-CM

## 2021-01-01 DIAGNOSIS — Z940 Kidney transplant status: Secondary | ICD-10-CM | POA: Diagnosis not present

## 2021-01-01 DIAGNOSIS — I130 Hypertensive heart and chronic kidney disease with heart failure and stage 1 through stage 4 chronic kidney disease, or unspecified chronic kidney disease: Secondary | ICD-10-CM | POA: Diagnosis present

## 2021-01-01 DIAGNOSIS — Z885 Allergy status to narcotic agent status: Secondary | ICD-10-CM

## 2021-01-01 DIAGNOSIS — E1140 Type 2 diabetes mellitus with diabetic neuropathy, unspecified: Secondary | ICD-10-CM | POA: Diagnosis present

## 2021-01-01 DIAGNOSIS — Z8249 Family history of ischemic heart disease and other diseases of the circulatory system: Secondary | ICD-10-CM

## 2021-01-01 DIAGNOSIS — Z888 Allergy status to other drugs, medicaments and biological substances status: Secondary | ICD-10-CM | POA: Diagnosis not present

## 2021-01-01 DIAGNOSIS — Q613 Polycystic kidney, unspecified: Secondary | ICD-10-CM | POA: Diagnosis not present

## 2021-01-01 DIAGNOSIS — R0902 Hypoxemia: Secondary | ICD-10-CM

## 2021-01-01 LAB — RESP PANEL BY RT-PCR (FLU A&B, COVID) ARPGX2
Influenza A by PCR: POSITIVE — AB
Influenza B by PCR: NEGATIVE
SARS Coronavirus 2 by RT PCR: NEGATIVE

## 2021-01-01 LAB — COMPREHENSIVE METABOLIC PANEL
ALT: 15 U/L (ref 0–44)
AST: 18 U/L (ref 15–41)
Albumin: 3.6 g/dL (ref 3.5–5.0)
Alkaline Phosphatase: 76 U/L (ref 38–126)
Anion gap: 10 (ref 5–15)
BUN: 14 mg/dL (ref 8–23)
CO2: 26 mmol/L (ref 22–32)
Calcium: 9.1 mg/dL (ref 8.9–10.3)
Chloride: 98 mmol/L (ref 98–111)
Creatinine, Ser: 1.25 mg/dL — ABNORMAL HIGH (ref 0.44–1.00)
GFR, Estimated: 49 mL/min — ABNORMAL LOW (ref >60)
Glucose, Bld: 100 mg/dL — ABNORMAL HIGH (ref 70–99)
Potassium: 3.8 mmol/L (ref 3.5–5.1)
Sodium: 134 mmol/L — ABNORMAL LOW (ref 135–145)
Total Bilirubin: 0.6 mg/dL (ref 0.3–1.2)
Total Protein: 8.6 g/dL — ABNORMAL HIGH (ref 6.5–8.1)

## 2021-01-01 LAB — CBC WITH DIFFERENTIAL/PLATELET
Abs Immature Granulocytes: 0.04 10*3/uL (ref 0.00–0.07)
Basophils Absolute: 0 10*3/uL (ref 0.0–0.1)
Basophils Relative: 1 %
Eosinophils Absolute: 0.1 10*3/uL (ref 0.0–0.5)
Eosinophils Relative: 1 %
HCT: 40.5 % (ref 36.0–46.0)
Hemoglobin: 12.4 g/dL (ref 12.0–15.0)
Immature Granulocytes: 1 %
Lymphocytes Relative: 7 %
Lymphs Abs: 0.6 10*3/uL — ABNORMAL LOW (ref 0.7–4.0)
MCH: 27 pg (ref 26.0–34.0)
MCHC: 30.6 g/dL (ref 30.0–36.0)
MCV: 88 fL (ref 80.0–100.0)
Monocytes Absolute: 0.9 10*3/uL (ref 0.1–1.0)
Monocytes Relative: 11 %
Neutro Abs: 6.8 10*3/uL (ref 1.7–7.7)
Neutrophils Relative %: 79 %
Platelets: 217 10*3/uL (ref 150–400)
RBC: 4.6 MIL/uL (ref 3.87–5.11)
RDW: 16.9 % — ABNORMAL HIGH (ref 11.5–15.5)
WBC: 8.5 10*3/uL (ref 4.0–10.5)
nRBC: 0 % (ref 0.0–0.2)

## 2021-01-01 LAB — CK: Total CK: 54 U/L (ref 38–234)

## 2021-01-01 LAB — LACTIC ACID, PLASMA
Lactic Acid, Venous: 1.1 mmol/L (ref 0.5–1.9)
Lactic Acid, Venous: 1.1 mmol/L (ref 0.5–1.9)

## 2021-01-01 LAB — BRAIN NATRIURETIC PEPTIDE: B Natriuretic Peptide: 36.9 pg/mL (ref 0.0–100.0)

## 2021-01-01 LAB — TROPONIN I (HIGH SENSITIVITY)
Troponin I (High Sensitivity): 13 ng/L (ref <18)
Troponin I (High Sensitivity): 9 ng/L (ref <18)

## 2021-01-01 LAB — LIPASE, BLOOD: Lipase: 21 U/L (ref 11–51)

## 2021-01-01 MED ORDER — OSELTAMIVIR PHOSPHATE 30 MG PO CAPS: 30.0000 mg | ORAL_CAPSULE | Freq: Two times a day (BID) | ORAL | Status: DC

## 2021-01-01 MED ORDER — TACROLIMUS 1 MG PO CAPS
2.0000 mg | ORAL_CAPSULE | Freq: Every day | ORAL | Status: DC
  Administered 2021-01-01: 2 mg via ORAL
  Filled 2021-01-01 (×2): qty 2

## 2021-01-01 MED ORDER — ONDANSETRON HCL 4 MG PO TABS: 4.0000 mg | ORAL_TABLET | Freq: Four times a day (QID) | ORAL | Status: DC | PRN

## 2021-01-01 MED ORDER — ONDANSETRON HCL 4 MG/2ML IJ SOLN: 4.0000 mg | Freq: Four times a day (QID) | INTRAMUSCULAR | Status: DC | PRN

## 2021-01-01 MED ORDER — FUROSEMIDE 40 MG PO TABS
40.0000 mg | ORAL_TABLET | Freq: Every day | ORAL | Status: DC
  Administered 2021-01-02: 40 mg via ORAL
  Filled 2021-01-01: qty 1

## 2021-01-01 MED ORDER — DICLOFENAC SODIUM 1 % EX GEL
2.0000 g | Freq: Four times a day (QID) | CUTANEOUS | Status: DC | PRN
  Filled 2021-01-01: qty 100

## 2021-01-01 MED ORDER — CYCLOBENZAPRINE HCL 10 MG PO TABS
5.0000 mg | ORAL_TABLET | Freq: Three times a day (TID) | ORAL | Status: DC | PRN
  Administered 2021-01-01: 5 mg via ORAL
  Filled 2021-01-01: qty 1

## 2021-01-01 MED ORDER — MOMETASONE FURO-FORMOTEROL FUM 200-5 MCG/ACT IN AERO: 2.0000 | INHALATION_SPRAY | Freq: Two times a day (BID) | RESPIRATORY_TRACT | Status: DC

## 2021-01-01 MED ORDER — NORTRIPTYLINE HCL 10 MG PO CAPS
10.0000 mg | ORAL_CAPSULE | Freq: Every day | ORAL | Status: DC
  Administered 2021-01-01: 10 mg via ORAL
  Filled 2021-01-01 (×2): qty 1

## 2021-01-01 MED ORDER — OSELTAMIVIR PHOSPHATE 30 MG PO CAPS
30.0000 mg | ORAL_CAPSULE | Freq: Two times a day (BID) | ORAL | Status: DC
  Administered 2021-01-02: 09:00:00 30 mg via ORAL
  Filled 2021-01-01 (×2): qty 1

## 2021-01-01 MED ORDER — OSELTAMIVIR PHOSPHATE 75 MG PO CAPS
75.0000 mg | ORAL_CAPSULE | Freq: Once | ORAL | Status: AC
  Administered 2021-01-01: 75 mg via ORAL
  Filled 2021-01-01: qty 1

## 2021-01-01 MED ORDER — ACETAMINOPHEN 650 MG RE SUPP: 650.0000 mg | Freq: Four times a day (QID) | RECTAL | Status: DC | PRN

## 2021-01-01 MED ORDER — METOPROLOL TARTRATE 50 MG PO TABS
50.0000 mg | ORAL_TABLET | Freq: Two times a day (BID) | ORAL | Status: DC
  Administered 2021-01-01 – 2021-01-02 (×2): 50 mg via ORAL
  Filled 2021-01-01 (×2): qty 1

## 2021-01-01 MED ORDER — AMLODIPINE BESYLATE 5 MG PO TABS
5.0000 mg | ORAL_TABLET | Freq: Every day | ORAL | Status: DC
  Administered 2021-01-02: 09:00:00 5 mg via ORAL
  Filled 2021-01-01: qty 1

## 2021-01-01 MED ORDER — MOMETASONE FURO-FORMOTEROL FUM 200-5 MCG/ACT IN AERO
2.0000 | INHALATION_SPRAY | Freq: Two times a day (BID) | RESPIRATORY_TRACT | Status: DC
  Administered 2021-01-01 – 2021-01-02 (×2): 2 via RESPIRATORY_TRACT
  Filled 2021-01-01: qty 8.8

## 2021-01-01 MED ORDER — ACETAMINOPHEN 325 MG PO TABS: 650.0000 mg | ORAL_TABLET | Freq: Four times a day (QID) | ORAL | Status: DC | PRN

## 2021-01-01 MED ORDER — IPRATROPIUM-ALBUTEROL 0.5-2.5 (3) MG/3ML IN SOLN
3.0000 mL | Freq: Once | RESPIRATORY_TRACT | Status: AC
  Administered 2021-01-01: 3 mL via RESPIRATORY_TRACT
  Filled 2021-01-01: qty 3

## 2021-01-01 MED ORDER — POLYETHYLENE GLYCOL 3350 17 G PO PACK
17.0000 g | PACK | Freq: Every day | ORAL | Status: DC
  Filled 2021-01-01 (×2): qty 1

## 2021-01-01 MED ORDER — PANTOPRAZOLE SODIUM 40 MG PO TBEC
40.0000 mg | DELAYED_RELEASE_TABLET | Freq: Every day | ORAL | Status: DC
  Administered 2021-01-02: 09:00:00 40 mg via ORAL
  Filled 2021-01-01 (×2): qty 1

## 2021-01-01 MED ORDER — FAMOTIDINE 20 MG PO TABS
20.0000 mg | ORAL_TABLET | Freq: Every day | ORAL | Status: DC
  Administered 2021-01-02: 20 mg via ORAL
  Filled 2021-01-01: qty 1

## 2021-01-01 MED ORDER — GABAPENTIN 100 MG PO CAPS
200.0000 mg | ORAL_CAPSULE | Freq: Two times a day (BID) | ORAL | Status: DC
  Administered 2021-01-01 – 2021-01-02 (×2): 200 mg via ORAL
  Filled 2021-01-01 (×2): qty 2

## 2021-01-01 MED ORDER — SODIUM CHLORIDE 0.9 % IV BOLUS
500.0000 mL | Freq: Once | INTRAVENOUS | Status: AC
  Administered 2021-01-01: 500 mL via INTRAVENOUS

## 2021-01-01 MED ORDER — ALBUTEROL SULFATE (2.5 MG/3ML) 0.083% IN NEBU
3.0000 mL | INHALATION_SOLUTION | Freq: Four times a day (QID) | RESPIRATORY_TRACT | Status: DC | PRN
  Administered 2021-01-01: 22:00:00 3 mL via RESPIRATORY_TRACT
  Filled 2021-01-01: qty 3

## 2021-01-01 MED ORDER — MYCOPHENOLATE SODIUM 180 MG PO TBEC
180.0000 mg | DELAYED_RELEASE_TABLET | Freq: Two times a day (BID) | ORAL | Status: DC
  Administered 2021-01-01 – 2021-01-02 (×2): 180 mg via ORAL
  Filled 2021-01-01 (×3): qty 1

## 2021-01-01 MED ORDER — ASPIRIN 81 MG PO CHEW
81.0000 mg | CHEWABLE_TABLET | Freq: Every day | ORAL | Status: DC
  Administered 2021-01-02: 81 mg via ORAL
  Filled 2021-01-01: qty 1

## 2021-01-01 MED ORDER — DOCUSATE SODIUM 100 MG PO CAPS
100.0000 mg | ORAL_CAPSULE | Freq: Two times a day (BID) | ORAL | Status: DC
  Administered 2021-01-01 – 2021-01-02 (×2): 100 mg via ORAL
  Filled 2021-01-01 (×2): qty 1

## 2021-01-01 MED ORDER — LIDOCAINE 5 % EX PTCH
1.0000 | MEDICATED_PATCH | CUTANEOUS | Status: DC
  Administered 2021-01-01: 22:00:00 1 via TRANSDERMAL
  Filled 2021-01-01 (×2): qty 1

## 2021-01-01 MED ORDER — TACROLIMUS 1 MG PO CAPS
3.0000 mg | ORAL_CAPSULE | Freq: Every day | ORAL | Status: DC
  Administered 2021-01-02: 3 mg via ORAL
  Filled 2021-01-01: qty 3

## 2021-01-01 MED ORDER — ENOXAPARIN SODIUM 60 MG/0.6ML IJ SOSY
50.0000 mg | PREFILLED_SYRINGE | INTRAMUSCULAR | Status: DC
  Administered 2021-01-01: 50 mg via SUBCUTANEOUS
  Filled 2021-01-01: qty 0.6

## 2021-01-01 MED ORDER — MAGNESIUM OXIDE -MG SUPPLEMENT 400 (240 MG) MG PO TABS
200.0000 mg | ORAL_TABLET | Freq: Every day | ORAL | Status: DC
  Administered 2021-01-02: 200 mg via ORAL
  Filled 2021-01-01: qty 1

## 2021-01-01 MED ORDER — ACETAMINOPHEN 325 MG PO TABS
650.0000 mg | ORAL_TABLET | Freq: Four times a day (QID) | ORAL | Status: DC | PRN
  Administered 2021-01-01 – 2021-01-02 (×2): 650 mg via ORAL
  Filled 2021-01-01 (×2): qty 2

## 2021-01-01 MED ORDER — METHYLPREDNISOLONE SODIUM SUCC 40 MG IJ SOLR
40.0000 mg | Freq: Every day | INTRAMUSCULAR | Status: DC
  Administered 2021-01-01 – 2021-01-02 (×2): 40 mg via INTRAVENOUS
  Filled 2021-01-01 (×2): qty 1

## 2021-01-01 NOTE — ED Notes (Signed)
Pt assisted to toilet. Pt ambulatory without assistance.

## 2021-01-01 NOTE — ED Triage Notes (Signed)
First NUrse Note:  Arrives via [facility] for c/o fever (99 per EMS) and SOB.  Sats 96% on home o2 3l.  CBG:  101.  Vs wnl.

## 2021-01-01 NOTE — ED Provider Notes (Signed)
[facility]-EMERGENCY DEPARTMENT  ____________________________________________  Time seen: Approximately 3:24 PM  I have reviewed the triage vital signs and the nursing notes.   HISTORY  Chief Complaint Headache, Fever, and Generalized Body Aches   Historian Patient     HPI Jasmine Holloway is a 60 y.o. female with a history of HIV, CHF, COPD, hypertension and renal transplant (6/19), presents to the emergency department with 4 days of fever, cough, headache, body aches and hypoxia at home.  Patient states that she is normally on 2 L and she has increased her O2 sats at home to 3 L.  Patient states that she has had some chest pain as well as pharyngitis.  She also endorses some generalized weakness.  She states that her body hurts all over to the touch.  No recent travel.  No sick contacts in the home with similar symptoms.   Past Medical History:  Diagnosis Date  . Asthma   . CHF (congestive heart failure) ([place])   . COPD (chronic obstructive pulmonary disease) ([place])   . Diabetes mellitus without complication ([place])   . Diastolic heart failure ([place])   . ESRD (end stage renal disease) on dialysis ([place])   . Hypertension   . Neuropathy   . Polycystic kidney disease   . Renal insufficiency      Immunizations up to date:  Yes.     Past Medical History:  Diagnosis Date  . Asthma   . CHF (congestive heart failure) ([place])   . COPD (chronic obstructive pulmonary disease) ([place])   . Diabetes mellitus without complication ([place])   . Diastolic heart failure ([place])   . ESRD (end stage renal disease) on dialysis ([place])   . Hypertension   . Neuropathy   . Polycystic kidney disease   . Renal insufficiency     Patient Active Problem List   Diagnosis Date Noted  . Acute hypoxemic respiratory failure ([place]) 01/01/2021  . Influenza A   . Stage 3a chronic kidney disease ([place])   . Neuropathy   . Gastroesophageal reflux disease without esophagitis   . Angioedema 04/05/2020  . Renal transplant  recipient 03/30/2018  . Palpitations 12/08/2017  . Accelerated hypertension 09/07/2017  . Lymphedema 05/11/2017  . Diabetes ([place]) 02/10/2017  . Esophageal stricture   . COPD with acute exacerbation ([place]) 08/06/2016  . Hypertensive crisis 08/06/2016  . Chronic pain syndrome 06/08/2016  . Anemia 06/08/2016  . Epigastric pain 03/28/2016  . Chronic diastolic CHF (congestive heart failure) ([place]) 02/04/2016  . Hoarseness of voice 02/04/2016  . Acute on chronic diastolic CHF (congestive heart failure) ([place]) 12/22/2015  . Essential hypertension 12/22/2015  . Mixed hyperlipidemia 12/22/2015  . Arm pain, left 05/31/2015  . Closed bilateral ankle fractures, with routine healing, subsequent encounter 07/24/2014  . Closed bilateral ankle fractures, initial encounter 06/20/2014    Past Surgical History:  Procedure Laterality Date  . ABDOMINAL HYSTERECTOMY    . ABDOMINAL SURGERY    . ARTERIOVENOUS GRAFT PLACEMENT Right    x3 (R forearm currently used for access)  . BREAST BIOPSY Left 12/03/2015   neg  . BREAST BIOPSY Right 02/2018   Neg at [facility]  . BREAST BIOPSY Left 10/22/2020   stereo bx, ribbon clip/ path pending  . COLON SURGERY    . ESOPHAGOGASTRODUODENOSCOPY (EGD) WITH PROPOFOL N/A 02/04/2017   Procedure: ESOPHAGOGASTRODUODENOSCOPY (EGD) WITH PROPOFOL;  Surgeon: Jonathon Bellows, MD;  Location: [facility] ENDOSCOPY;  Service: Endoscopy;  Laterality: N/A;  . 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 PERIPHERAL VASCULAR  CATHETERIZATION Right 08/04/2016   Procedure: A/V Shuntogram;  Surgeon: Algernon Huxley, MD;  Location: [facility] [facility];  Service: Cardiovascular;  Laterality: Right;  . VEIN HARVEST      Prior to Admission medications   Medication Sig Start Date End Date Taking? Authorizing Provider  acetaminophen (TYLENOL) 325 MG tablet Take 2 tablets (650 mg total) by mouth every 6 (six) hours as needed for mild pain or headache. 03/18/16   Gouru, Illene Silver, MD  amLODipine (NORVASC) 5 MG tablet Take 5  mg daily by mouth.  08/31/14   [provider]  aspirin 81 MG chewable tablet Chew 1 tablet (81 mg total) by mouth daily. 01/17/16   Gladstone Lighter, MD  budesonide-formoterol (SYMBICORT) 160-4.5 MCG/ACT inhaler Inhale 2 puffs into the lungs 2 (two) times daily.    [provider]  diphenhydrAMINE (BENADRYL) 25 mg capsule Take 1 capsule (25 mg total) by mouth 2 (two) times daily for 5 days. 04/06/20 04/11/20  Sharen Hones, MD  docusate sodium (COLACE) 100 MG capsule Take 100 mg by mouth 2 (two) times daily.    [provider]  famotidine (PEPCID) 10 MG tablet Take 1 tablet (10 mg total) by mouth 2 (two) times daily for 7 days. 04/06/20 04/13/20  Sharen Hones, MD  furosemide (LASIX) 40 MG tablet Take 40 mg by mouth every morning. 11/19/20   [provider]  gabapentin (NEURONTIN) 100 MG capsule Take 100 mg by mouth 3 (three) times daily.     [provider]  metoprolol tartrate (LOPRESSOR) 25 MG tablet Take 75 mg by mouth 2 (two) times daily.  05/23/18   [provider]  nortriptyline (PAMELOR) 10 MG capsule Take 10-20 mg by mouth See admin instructions. Take 1 capsule ('10mg'$ ) by mouth every morning and take 2 capsules ('20mg'$ ) by mouth every night 01/09/20   [provider]  omeprazole (PRILOSEC) 40 MG capsule Take 40 mg by mouth daily.     [provider]  predniSONE (DELTASONE) 5 MG tablet Take 5 mg by mouth daily. 01/05/20   [provider]  Specialty Vitamins Products (MAGNESIUM, AMINO ACID CHELATE,) 133 MG tablet Take 1 tablet by mouth 2 (two) times daily.    [provider]  tacrolimus (PROGRAF) 1 MG capsule Take 2-3 mg by mouth See admin instructions. Take 3 capsules ('3mg'$ ) by mouth every morning and take 2 capsules ('2mg'$ ) by mouth every night    [provider]  VENTOLIN HFA 108 (90 Base) MCG/ACT inhaler Inhale 2 puffs into the lungs every 6 (six) hours as needed for wheezing or shortness of breath.      [provider]    Allergies Morphine and related, Strawberry extract, Tramadol, Venofer [iron sucrose], Buprenorphine hcl, and Morphine  Family History  Problem Relation Age of Onset  . Hypertension Brother   . Heart failure Brother   . Hypertension Mother   . Heart disease Mother   . Breast cancer Neg Hx     Social History Social History   Tobacco Use  . Smoking status: Former Smoker    Packs/day: 0.25    Quit date: 08/11/2015    Years since quitting: 5.3  . Smokeless tobacco: Never Used  Vaping Use  . Vaping Use: Never used  Substance Use Topics  . Alcohol use: No    Alcohol/week: 0.0 standard drinks  . Drug use: No     Review of Systems  Constitutional: Patient has fever.  Eyes:  No discharge ENT: No  upper respiratory complaints. Respiratory: Patient has cough.  No SOB/ use of accessory muscles to breath Gastrointestinal:   No nausea, no vomiting.  No diarrhea.  No constipation. Musculoskeletal: Negative for musculoskeletal pain. Skin: Negative for rash, abrasions, lacerations, ecchymosis. ____________________________________________   PHYSICAL EXAM:  VITAL SIGNS: ED Triage Vitals  Enc Vitals Group     BP 01/01/21 1454 125/86     Pulse Rate 01/01/21 1454 (!) 102     Resp 01/01/21 1454 20     Temp 01/01/21 1454 98.6 F (37 C)     Temp Source 01/01/21 1454 Oral     SpO2 01/01/21 1454 98 %     Weight 01/01/21 1455 230 lb (104.3 kg)     Height 01/01/21 1455 '5\' 3"'$  (1.6 m)     Head Circumference --      Peak Flow --      Pain Score 01/01/21 1454 7     Pain Loc --      Pain Edu? --      Excl. in [place]? --      Constitutional: Alert and oriented. Patient is lying supine. Eyes: Conjunctivae are normal. PERRL. EOMI. Head: Atraumatic. ENT:      Ears: Tympanic membranes are mildly injected with mild effusion bilaterally.       Nose: No congestion/rhinnorhea.      Mouth/Throat: Mucous membranes are moist. Posterior pharynx is mildly erythematous.   Hematological/Lymphatic/Immunilogical: No cervical lymphadenopathy.  Cardiovascular: Normal rate, regular rhythm. Normal S1 and S2.  Good peripheral circulation. Respiratory: Normal respiratory effort without tachypnea or retractions. Lungs CTAB. Good air entry to the bases with no decreased or absent breath sounds. Gastrointestinal: Bowel sounds 4 quadrants. Soft and nontender to palpation. No guarding or rigidity. No palpable masses. No distention. No CVA tenderness. Musculoskeletal: Full range of motion to all extremities. No gross deformities appreciated. Neurologic:  Normal speech and language. No gross focal neurologic deficits are appreciated.  Skin:  Skin is warm, dry and intact. No rash noted. Psychiatric: Mood and affect are normal. Speech and behavior are normal. Patient exhibits appropriate insight and judgement.    ____________________________________________   LABS (all labs ordered are listed, but only abnormal results are displayed)  Labs Reviewed  RESP PANEL BY RT-PCR (FLU A&B, COVID) ARPGX2 - Abnormal; Notable for the following components:      Result Value   Influenza A by PCR POSITIVE (*)    All other components within normal limits  CBC WITH DIFFERENTIAL/PLATELET - Abnormal; Notable for the following components:   RDW 16.9 (*)    Lymphs Abs 0.6 (*)    All other components within normal limits  COMPREHENSIVE METABOLIC PANEL - Abnormal; Notable for the following components:   Sodium 134 (*)    Glucose, Bld 100 (*)    Creatinine, Ser 1.25 (*)    Total Protein 8.6 (*)    GFR, Estimated 49 (*)    All other components within normal limits  CULTURE, BLOOD (ROUTINE X 2)  CULTURE, BLOOD (ROUTINE X 2)  LIPASE, BLOOD  LACTIC ACID, PLASMA  LACTIC ACID, PLASMA  CK  BRAIN NATRIURETIC PEPTIDE  BASIC METABOLIC PANEL  CBC  TROPONIN I (HIGH SENSITIVITY)  TROPONIN I (HIGH SENSITIVITY)    ____________________________________________  EKG   ____________________________________________  RADIOLOGY Unk Pinto, personally viewed and evaluated these images (plain radiographs) as part of my medical decision making, as well as reviewing the written report by the radiologist.    DG Chest 2 View  Result Date: 01/01/2021 CLINICAL DATA:  Cough and shortness of breath.  Fever. EXAM: CHEST - 2 VIEW COMPARISON:  Radiograph 04/05/2020. chest CT 06/15/2017 FINDINGS: Mild cardiomegaly is similar to prior exam. Unchanged mediastinal contours. There is interstitial and bronchial thickening, chronic with slight improvement from prior. Previous patchy airspace disease has improved in the interim. Previous fluid in the fissures has improved. No large subpulmonic effusion. No pneumothorax. Surgical clips project over the left upper hemithorax. No acute osseous abnormalities are seen. IMPRESSION: 1. Chronic interstitial and bronchial thickening, less prominent than on prior exam. Suspect underlying chronic lung disease, emphysema versus interstitial lung disease. 2. Improved patchy bilateral airspace disease from prior exam. 3. Cardiomegaly. Electronically Signed   By: Keith Rake M.D.   On: 01/01/2021 16:20    ____________________________________________    PROCEDURES  Procedure(s) performed:     Procedures     Medications  aspirin chewable tablet 81 mg (has no administration in time range)  amLODipine (NORVASC) tablet 5 mg (has no administration in time range)  metoprolol tartrate (LOPRESSOR) tablet 50 mg (has no administration in time range)  nortriptyline (PAMELOR) capsule 10 mg (has no administration in time range)  methylPREDNISolone sodium succinate (SOLU-MEDROL) 40 mg/mL injection 40 mg (has no administration in time range)  docusate sodium (COLACE) capsule 100 mg (has no administration in time range)  famotidine (PEPCID) tablet 20 mg (has no administration in  time range)  pantoprazole (PROTONIX) EC tablet 40 mg (has no administration in time range)  tacrolimus (PROGRAF) capsule 2 mg (has no administration in time range)  gabapentin (NEURONTIN) capsule 200 mg (has no administration in time range)  albuterol (VENTOLIN HFA) 108 (90 Base) MCG/ACT inhaler 2 puff (has no administration in time range)  mycophenolate (MYFORTIC) EC tablet 180 mg (has no administration in time range)  cyclobenzaprine (FLEXERIL) tablet 5 mg (has no administration in time range)  lidocaine (LIDODERM) 5 % 1 patch (has no administration in time range)  diclofenac Sodium (VOLTAREN) 1 % topical gel 2 g (has no administration in time range)  furosemide (LASIX) tablet 40 mg (has no administration in time range)  magnesium oxide (MAG-OX) tablet 200 mg (has no administration in time range)  polyethylene glycol (MIRALAX / GLYCOLAX) packet 17 g (has no administration in time range)  enoxaparin (LOVENOX) injection 50 mg (has no administration in time range)  acetaminophen (TYLENOL) tablet 650 mg (has no administration in time range)    Or  acetaminophen (TYLENOL) suppository 650 mg (has no administration in time range)  ondansetron (ZOFRAN) tablet 4 mg (has no administration in time range)    Or  ondansetron (ZOFRAN) injection 4 mg (has no administration in time range)  mometasone-formoterol (DULERA) 200-5 MCG/ACT inhaler 2 puff (has no administration in time range)  oseltamivir (TAMIFLU) capsule 75 mg (has no administration in time range)    Followed by  oseltamivir (TAMIFLU) capsule 30 mg (has no administration in time range)  ipratropium-albuterol (DUONEB) 0.5-2.5 (3) MG/3ML nebulizer solution 3 mL (3 mLs Nebulization Given 01/01/21 1705)  sodium chloride 0.9 % bolus 500 mL (0 mLs Intravenous Stopped 01/01/21 1825)     ____________________________________________   INITIAL IMPRESSION / ASSESSMENT AND PLAN / ED COURSE  Pertinent labs & imaging results that were available during  my care of the patient were reviewed by me and considered in my medical decision making (see chart for details).      Assessment and Plan: Fever Headache Myalgias  Hypoxia  60 year old female presents to the emergency  department with fever, headache, myalgias and new onset hypoxia.  Patient was mildly tachycardic at triage but vital signs were otherwise reassuring.  Patient was satting at 98% on 4 L by nasal cannula.  Patient is typically only on 2 L.  Patient does not have leukocytosis in comparison with her baseline.  CK, lipase and lactic were within reference range.  BNP and troponin were within reference range.  Patient had mild elevation in her creatinine  But CMP was otherwise reassuring.  Patient tested positive for influenza A. Given patient's history and presenting symptoms with new onset hypoxia, patient was admitted to the hospitalist service under Dr. Bobbye Charleston.  ____________________________________________  FINAL CLINICAL IMPRESSION(S) / ED DIAGNOSES  Final diagnoses:  Hypoxia  Influenza A      NEW MEDICATIONS STARTED DURING THIS VISIT:  ED Discharge Orders    None          This chart was dictated using voice recognition software/Dragon. Despite best efforts to proofread, errors can occur which can change the meaning. Any change was purely unintentional.     Lannie Fields, PA-C 01/01/21 1831    Arta Silence, MD 01/01/21 2237

## 2021-01-01 NOTE — ED Notes (Signed)
Called lab to request lab blood draw- state they will come to draw bloodwork.

## 2021-01-01 NOTE — ED Triage Notes (Signed)
Pt comes into the ED via [facility] from home c/o fever, body aches, and increased SHOB.  Pt wears O2 as needed and has been wearing 3L at home to assist.  Pt presents with even and unlabored respirations at this time.  PT completed a rapid COVID test at home which came back negative but she hasnt gotten the send off COVID test back.  PT states that her first symptoms started on Sunday.

## 2021-01-01 NOTE — H&P (Signed)
[facility] NAME: Jasmine Holloway    MR#:  [id number]  DATE OF BIRTH:  12/30/1960  DATE OF ADMISSION:  01/01/2021  PRIMARY CARE PHYSICIAN: Talbert Cage, FNP   REQUESTING/REFERRING PHYSICIAN: Dr. Arta Silence.  CHIEF COMPLAINT:   Chief Complaint  Patient presents with  . Headache  . Fever  . Generalized Body Aches    HISTORY OF PRESENT ILLNESS:  Jasmine Holloway  is a 60 y.o. female came in with fever, body aches shortness of breath cough and not feeling well.  She states that she wears oxygen if she gets a cold.  She has been feeling awful since Sunday.  On Monday she almost passed out.  On Tuesday and Wednesday morning she has been in bed.  This morning did vomit once.  Had a temperature of 100.7.  Been coughing and having shortness of breath and chest pain.  In the ER she was found to be flu positive.  After she walked back from the bathroom she was found to be hypoxic of 88% on room air.  Hospitalist services were contacted for further evaluation.  PAST MEDICAL HISTORY:   Past Medical History:  Diagnosis Date  . Asthma   . CHF (congestive heart failure) ([place])   . COPD (chronic obstructive pulmonary disease) ([place])   . Diabetes mellitus without complication ([place])   . Diastolic heart failure ([place])   . ESRD (end stage renal disease) on dialysis ([place])   . Hypertension   . Neuropathy   . Polycystic kidney disease   . Renal insufficiency     PAST SURGICAL HISTORY:   Past Surgical History:  Procedure Laterality Date  . ABDOMINAL HYSTERECTOMY    . ABDOMINAL SURGERY    . ARTERIOVENOUS GRAFT PLACEMENT Right    x3 (R forearm currently used for access)  . BREAST BIOPSY Left 12/03/2015   neg  . BREAST BIOPSY Right 02/2018   Neg at [facility]  . BREAST BIOPSY Left 10/22/2020   stereo bx, ribbon clip/ path pending  . COLON SURGERY    . ESOPHAGOGASTRODUODENOSCOPY (EGD) WITH PROPOFOL N/A 02/04/2017   Procedure:  ESOPHAGOGASTRODUODENOSCOPY (EGD) WITH PROPOFOL;  Surgeon: Jonathon Bellows, MD;  Location: [facility] ENDOSCOPY;  Service: Endoscopy;  Laterality: N/A;  . 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 PERIPHERAL VASCULAR CATHETERIZATION Right 08/04/2016   Procedure: A/V Shuntogram;  Surgeon: Algernon Huxley, MD;  Location: [facility] CV LAB;  Service: Cardiovascular;  Laterality: Right;  . VEIN HARVEST      SOCIAL HISTORY:   Social History   Tobacco Use  . Smoking status: Former Smoker    Packs/day: 0.25    Quit date: 08/11/2015    Years since quitting: 5.3  . Smokeless tobacco: Never Used  Substance Use Topics  . Alcohol use: No    Alcohol/week: 0.0 standard drinks    FAMILY HISTORY:   Family History  Problem Relation Age of Onset  . Hypertension Brother   . Heart failure Brother   . Hypertension Mother   . Heart disease Mother   . Breast cancer Neg Hx     DRUG ALLERGIES:   Allergies  Allergen Reactions  . Morphine And Related Nausea And Vomiting  . Strawberry Extract   . Tramadol Nausea And Vomiting  . Venofer [Iron Sucrose]   . Buprenorphine Hcl Nausea And Vomiting  . Morphine Nausea And Vomiting    Halllucinations    REVIEW OF SYSTEMS:  CONSTITUTIONAL: Positive for fever chills and  sweats.  Positive for fatigue and weakness.  EYES: No blurred or double vision.  EARS, NOSE, AND THROAT: Positive for hoarse voice and scratchy sore throat RESPIRATORY: Positive for cough, shortness of breath, and wheezing.  No hemoptysis.  CARDIOVASCULAR: Some chest pain.  GASTROINTESTINAL: No nausea, vomiting, diarrhea or abdominal pain. No blood in bowel movements GENITOURINARY: No dysuria, hematuria.  ENDOCRINE: No polyuria, nocturia,  HEMATOLOGY: No anemia, easy bruising or bleeding SKIN: No rash or lesion. MUSCULOSKELETAL: Positive joint pains and aches NEUROLOGIC: No tingling, numbness, weakness.  PSYCHIATRY: No anxiety or depression.   MEDICATIONS AT HOME:   Prior to Admission  medications   Medication Sig Start Date End Date Taking? Authorizing Provider  acetaminophen (TYLENOL) 325 MG tablet Take 2 tablets (650 mg total) by mouth every 6 (six) hours as needed for mild pain or headache. 03/18/16   Gouru, Illene Silver, MD  amLODipine (NORVASC) 5 MG tablet Take 5 mg daily by mouth.  08/31/14   [provider]  aspirin 81 MG chewable tablet Chew 1 tablet (81 mg total) by mouth daily. 01/17/16   Gladstone Lighter, MD  budesonide-formoterol (SYMBICORT) 160-4.5 MCG/ACT inhaler Inhale 2 puffs into the lungs 2 (two) times daily.    [provider]  diphenhydrAMINE (BENADRYL) 25 mg capsule Take 1 capsule (25 mg total) by mouth 2 (two) times daily for 5 days. 04/06/20 04/11/20  Sharen Hones, MD  docusate sodium (COLACE) 100 MG capsule Take 100 mg by mouth 2 (two) times daily.    [provider]  famotidine (PEPCID) 10 MG tablet Take 1 tablet (10 mg total) by mouth 2 (two) times daily for 7 days. 04/06/20 04/13/20  Sharen Hones, MD  gabapentin (NEURONTIN) 100 MG capsule Take 100 mg by mouth 3 (three) times daily.     [provider]  metoprolol tartrate (LOPRESSOR) 25 MG tablet Take 75 mg by mouth 2 (two) times daily.  05/23/18   [provider]  nortriptyline (PAMELOR) 10 MG capsule Take 10-20 mg by mouth See admin instructions. Take 1 capsule ('10mg'$ ) by mouth every morning and take 2 capsules ('20mg'$ ) by mouth every night 01/09/20   [provider]  omeprazole (PRILOSEC) 40 MG capsule Take 40 mg by mouth daily.     [provider]  predniSONE (DELTASONE) 5 MG tablet Take 5 mg by mouth daily. 01/05/20   [provider]  Specialty Vitamins Products (MAGNESIUM, AMINO ACID CHELATE,) 133 MG tablet Take 1 tablet by mouth 2 (two) times daily.    [provider]  tacrolimus (PROGRAF) 1 MG capsule Take 2-3 mg by mouth See admin instructions. Take 3 capsules ('3mg'$ ) by mouth every morning and take 2 capsules ('2mg'$ ) by mouth every night     [provider]  VENTOLIN HFA 108 (90 Base) MCG/ACT inhaler Inhale 2 puffs into the lungs every 6 (six) hours as needed for wheezing or shortness of breath.     [provider]   Medication reconciliation still undergoing.  Patient does take prednisone 10 mg daily mycophenolate 180 mg ER twice daily.  Tacrolimus 2 mg twice a day.  Norvasc 5 mg daily, aspirin 81 mg daily as needed Flexeril diclofenac.  Takes Lidoderm patch.  Lasix 40 mg daily.  Gabapentin 200 mg daily.  Magnesium oxide.  Metoprolol 50 mg twice daily.  Nortriptyline 10 mg nightly, omeprazole and MiraLAX.  VITAL SIGNS:  Blood pressure 121/84, pulse 96, temperature 98.6 F (37 C), temperature source Oral, resp. rate 17, height '5\' 3"'$  (1.6  m), weight 104.3 kg, SpO2 100 %.  PHYSICAL EXAMINATION:  GENERAL:  60 y.o.-year-old patient lying in the bed with slight respiratory distress after walking back from the bathroom. EYES: Pupils equal, round, reactive to light and accommodation. No scleral icterus. Extraocular muscles intact.  HEENT: Head atraumatic, normocephalic. NECK:  Supple.  LUNGS: Decreased breath sounds bilaterally, positive inspiratory and expiratory wheezing, rales,rhonchi or crepitation. No use of accessory muscles of respiration.  CARDIOVASCULAR: S1, S2 normal. No murmurs, rubs, or gallops.  ABDOMEN: Soft, nontender, nondistended.  EXTREMITIES: Trace pedal edema.no cyanosis, or clubbing.  NEUROLOGIC: Cranial nerves II through XII are intact. Muscle strength 5/5 in all extremities. Sensation intact. Gait not checked.  PSYCHIATRIC: The patient is alert and oriented x 3.  SKIN: No rash, lesion, or ulcer.   LABORATORY PANEL:   CBC Recent Labs  Lab 01/01/21 1530  WBC 8.5  HGB 12.4  HCT 40.5  PLT 217   ------------------------------------------------------------------------------------------------------------------  Chemistries  Recent Labs  Lab 01/01/21 1530  NA 134*  K 3.8  CL 98  CO2 26   GLUCOSE 100*  BUN 14  CREATININE 1.25*  CALCIUM 9.1  AST 18  ALT 15  ALKPHOS 76  BILITOT 0.6   ------------------------------------------------------------------------------------------------------------------  RADIOLOGY:  DG Chest 2 View  Result Date: 01/01/2021 CLINICAL DATA:  Cough and shortness of breath.  Fever. EXAM: CHEST - 2 VIEW COMPARISON:  Radiograph 04/05/2020. chest CT 06/15/2017 FINDINGS: Mild cardiomegaly is similar to prior exam. Unchanged mediastinal contours. There is interstitial and bronchial thickening, chronic with slight improvement from prior. Previous patchy airspace disease has improved in the interim. Previous fluid in the fissures has improved. No large subpulmonic effusion. No pneumothorax. Surgical clips project over the left upper hemithorax. No acute osseous abnormalities are seen. IMPRESSION: 1. Chronic interstitial and bronchial thickening, less prominent than on prior exam. Suspect underlying chronic lung disease, emphysema versus interstitial lung disease. 2. Improved patchy bilateral airspace disease from prior exam. 3. Cardiomegaly. Electronically Signed   By: Keith Rake M.D.   On: 01/01/2021 16:20   US THYROID  Result Date: 12/31/2020 CLINICAL DATA:  Hoarseness, concern for thyroid abnormality EXAM: THYROID ULTRASOUND TECHNIQUE: Ultrasound examination of the thyroid gland and adjacent soft tissues was performed. COMPARISON:  04/05/2020 neck CT with contrast FINDINGS: Parenchymal Echotexture: Normal Isthmus: 5 mm Right lobe: 3.4 x 1.2 x 2.1 cm Left lobe: 3.7 x 1.2 x 1.3 cm _________________________________________________________ Estimated total number of nodules >/= 1 cm: 0 Number of spongiform nodules >/=  2 cm not described below (TR1): 0 Number of mixed cystic and solid nodules >/= 1.5 cm not described below (TR2): 0 _________________________________________________________ No discrete nodules are seen within the thyroid gland. IMPRESSION: Normal  thyroid ultrasound for age The above is in keeping with the ACR TI-RADS recommendations - J Am Coll Radiol 2017;14:587-595. Electronically Signed   By: Jerilynn Mages.  Shick M.D.   On: 12/31/2020 09:58     IMPRESSION AND PLAN:   1.  Acute hypoxic respiratory failure with walking back from the bathroom her pulse ox was 88% on room air.  Oxygen supplementation. 2.  Influenza a positive with bronchitis.  Start Tamiflu. 3.  COPD exacerbation.  With her being on chronic prednisone will give 40 mg of Solu-Medrol daily. 4.  Renal transplantation.  Chronic kidney disease stage IIIa.  Continue tacrolimus, mycophenolate and steroids. 5.  Essential hypertension continue Norvasc and metoprolol 6.  Neuropathy continue gabapentin, diclofenac, Lidoderm patch 7.  GERD continue omeprazole 8.  Chronic diastolic congestive  heart failure.  No signs of heart failure currently.  Called the pace program but it was the wrong pace program that popped up in the computer.  All the records are reviewed and case discussed with ED provider. Management plans discussed with the patient, and she is in agreement.  Tried to call family in [place].  Patient meets inpatient criteria with acute hypoxic respiratory failure with influenza A being positive.  Patient will be placed on oxygen.  CODE STATUS: Full Code  TOTAL TIME TAKING CARE OF THIS PATIENT: 55 minutes.    Loletha Grayer M.D on 01/01/2021 at 6:03 PM  Between 7am to 6pm - Pager - [phone]  After 6pm call admission pager [phone]  [facility]  CC: Primary care physician; Talbert Cage, FNP

## 2021-01-01 NOTE — Progress Notes (Signed)
PHARMACIST - PHYSICIAN COMMUNICATION  CONCERNING:  Enoxaparin (Lovenox) for DVT Prophylaxis    RECOMMENDATION: Patient was prescribed enoxaprin '40mg'$  q24 hours for VTE prophylaxis.   Filed Weights   01/01/21 1455  Weight: 104.3 kg (230 lb)    Body mass index is 40.74 kg/m.  Estimated Creatinine Clearance: 53.9 mL/min (A) (by C-G formula based on SCr of 1.25 mg/dL (H)).   Based on [place] patient is candidate for enoxaparin 0.'5mg'$ /kg TBW SQ every 24 hours based on BMI being >30.  DESCRIPTION: Pharmacy has adjusted enoxaparin dose per [facility] policy.  Patient is now receiving enoxaparin 50 mg every 24 hours    Dorothe Pea, PharmD, BCPS 01/01/2021 6:19 PM

## 2021-01-01 NOTE — ED Notes (Signed)
Pt 88% on RA after ambulation. Admitting MD with pt.

## 2021-01-02 DIAGNOSIS — I10 Essential (primary) hypertension: Secondary | ICD-10-CM

## 2021-01-02 DIAGNOSIS — J9621 Acute and chronic respiratory failure with hypoxia: Secondary | ICD-10-CM

## 2021-01-02 LAB — BASIC METABOLIC PANEL
Anion gap: 11 (ref 5–15)
BUN: 16 mg/dL (ref 8–23)
CO2: 22 mmol/L (ref 22–32)
Calcium: 9.2 mg/dL (ref 8.9–10.3)
Chloride: 99 mmol/L (ref 98–111)
Creatinine, Ser: 1.39 mg/dL — ABNORMAL HIGH (ref 0.44–1.00)
GFR, Estimated: 43 mL/min — ABNORMAL LOW (ref >60)
Glucose, Bld: 121 mg/dL — ABNORMAL HIGH (ref 70–99)
Potassium: 4.8 mmol/L (ref 3.5–5.1)
Sodium: 132 mmol/L — ABNORMAL LOW (ref 135–145)

## 2021-01-02 LAB — CBC
HCT: 38.5 % (ref 36.0–46.0)
Hemoglobin: 12.1 g/dL (ref 12.0–15.0)
MCH: 27.7 pg (ref 26.0–34.0)
MCHC: 31.4 g/dL (ref 30.0–36.0)
MCV: 88.1 fL (ref 80.0–100.0)
Platelets: 193 10*3/uL (ref 150–400)
RBC: 4.37 MIL/uL (ref 3.87–5.11)
RDW: 17.1 % — ABNORMAL HIGH (ref 11.5–15.5)
WBC: 5.7 10*3/uL (ref 4.0–10.5)
nRBC: 0 % (ref 0.0–0.2)

## 2021-01-02 MED ORDER — METOPROLOL TARTRATE 50 MG PO TABS
50.0000 mg | ORAL_TABLET | Freq: Two times a day (BID) | ORAL | Status: DC
Start: 2021-01-02 — End: 2021-10-21

## 2021-01-02 MED ORDER — GABAPENTIN 100 MG PO CAPS
200.0000 mg | ORAL_CAPSULE | Freq: Two times a day (BID) | ORAL | Status: DC
Start: 2021-01-02 — End: 2021-10-21

## 2021-01-02 MED ORDER — PREDNISONE 10 MG PO TABS: ORAL_TABLET | ORAL | 0 refills | Status: DC

## 2021-01-02 MED ORDER — OSELTAMIVIR PHOSPHATE 30 MG PO CAPS: 30.0000 mg | ORAL_CAPSULE | Freq: Two times a day (BID) | ORAL | 0 refills | Status: AC

## 2021-01-02 MED ORDER — LIDOCAINE 5 % EX PTCH: 1.0000 | MEDICATED_PATCH | CUTANEOUS | 0 refills | Status: DC

## 2021-01-02 NOTE — TOC Initial Note (Signed)
Transition of Care [facility]) - Initial/Assessment Note    Patient Details  Name: Jasmine Holloway MRN: [id number] Date of Birth: 1961-01-02  [facility] CM/SW Contact:    Pete Pelt, RN Phone Number: 01/02/2021, 9:28 AM  Clinical Narrative:      TOC called patient in room due to isolation.  Patient lives at home alone, has help from family and [facility] if and when needed.  Nurse visits patient at home through [facility].  Patient has oxygen at home.  Patient walks with a walker and cane at home.  Patient has no concerns about getting to appointments and getting medications, as [facility] assists with that.  Patient feels comfortable returning home at discharge.                   Patient Goals and CMS Choice        Expected Discharge Plan and Services                                                Prior Living Arrangements/Services                       Activities of Daily Living Home Assistive Devices/Equipment: Cane (specify quad or straight),Eyeglasses,Oxygen,Shower chair with back,Dentures (specify type) ADL Screening (condition at time of admission) Patient's cognitive ability adequate to safely complete daily activities?: Yes Is the patient deaf or have difficulty hearing?: No Does the patient have difficulty seeing, even when wearing glasses/contacts?: No Does the patient have difficulty concentrating, remembering, or making decisions?: No Patient able to express need for assistance with ADLs?: Yes Does the patient have difficulty dressing or bathing?: No Independently performs ADLs?: Yes (appropriate for developmental age) Does the patient have difficulty walking or climbing stairs?: No Weakness of Legs: Both Weakness of Arms/Hands: Both  Permission Sought/Granted                  Emotional Assessment              Admission diagnosis:  Influenza A [J10.1] Hypoxia [R09.02] Acute hypoxemic respiratory failure ([place]) [J96.01] Patient Active  Problem List   Diagnosis Date Noted  . Acute hypoxemic respiratory failure ([place]) 01/01/2021  . Influenza A   . Stage 3a chronic kidney disease ([place])   . Neuropathy   . Gastroesophageal reflux disease without esophagitis   . Angioedema 04/05/2020  . Renal transplant recipient 03/30/2018  . Palpitations 12/08/2017  . Accelerated hypertension 09/07/2017  . Lymphedema 05/11/2017  . Diabetes ([place]) 02/10/2017  . Esophageal stricture   . COPD with acute exacerbation ([place]) 08/06/2016  . Hypertensive crisis 08/06/2016  . Chronic pain syndrome 06/08/2016  . Anemia 06/08/2016  . Epigastric pain 03/28/2016  . Chronic diastolic CHF (congestive heart failure) ([place]) 02/04/2016  . Hoarseness of voice 02/04/2016  . Acute on chronic diastolic CHF (congestive heart failure) ([place]) 12/22/2015  . Essential hypertension 12/22/2015  . Mixed hyperlipidemia 12/22/2015  . Arm pain, left 05/31/2015  . Closed bilateral ankle fractures, with routine healing, subsequent encounter 07/24/2014  . Closed bilateral ankle fractures, initial encounter 06/20/2014   PCP:  Talbert Cage, FNP Pharmacy:   [facility] ([facility]) - [place], [address] [place] [address] Phone: [phone] Fax: [place], [place] -  [address] [place] [address] Phone: [phone] Fax: [phone]     Social Determinants of Health (SDOH) Interventions    Readmission Risk Interventions No flowsheet data found.

## 2021-01-02 NOTE — Discharge Summary (Signed)
[facility] NAME: Jasmine Holloway    MR#:  [id number]  DATE OF BIRTH:  1960-09-20  DATE OF ADMISSION:  01/01/2021 ADMITTING PHYSICIAN: Loletha Grayer, MD  DATE OF DISCHARGE: 01/02/2021  3:37 PM  PRIMARY CARE PHYSICIAN: Talbert Cage, FNP    ADMISSION DIAGNOSIS:  Influenza A [J10.1] Hypoxia [R09.02] Acute hypoxemic respiratory failure ([place]) [J96.01]  DISCHARGE DIAGNOSIS:  Active Problems:   Chronic diastolic CHF (congestive heart failure) (HCC)   COPD with acute exacerbation ([place])   Renal transplant recipient   Acute hypoxemic respiratory failure ([place])   SECONDARY DIAGNOSIS:   Past Medical History:  Diagnosis Date  . Asthma   . CHF (congestive heart failure) ([place])   . COPD (chronic obstructive pulmonary disease) ([place])   . Diabetes mellitus without complication ([place])   . Diastolic heart failure ([place])   . ESRD (end stage renal disease) on dialysis ([place])   . Hypertension   . Neuropathy   . Polycystic kidney disease   . Renal insufficiency     HOSPITAL COURSE:   1.  Acute on chronic hypoxic respiratory failure.  Patient had pulse ox of 88% on room air with ambulation on admission.  Upon disposition her pulse ox was 89% with ambulation on room air. The patient does have chronic oxygen at home and I advised her to wear it for right now while she is not feeling sick.  Recommend checking room air pulse ox with ambulation once better.  Patient was feeling so much better and wanted to go home today. 2.  Influenza A+.  I started Tamiflu yesterday and will complete 4 more days. 3.  COPD exacerbation.  With her being on chronic prednisone and I did give her 40 mg of Solu-Medrol yesterday and today.  Lungs sound much better today than yesterday.  Will taper prednisone down to her normal 10 mg daily. 4.  Renal transplantation history.  Chronic kidney disease stage IIIa.  Continue tacrolimus, mycophenolate and steroids. 5.  Essential  hypertension on Norvasc and metoprolol 6.  Neuropathy.  Continue gabapentin, diclofenac and Lidoderm patch 7.  GERD on omeprazole 8.  Chronic diastolic congestive heart failure.  No signs of heart failure currently.  DISCHARGE CONDITIONS:   Satisfactory  CONSULTS OBTAINED:  None  DRUG ALLERGIES:   Allergies  Allergen Reactions  . Morphine And Related Nausea And Vomiting  . Strawberry Extract Other (See Comments)    Vomiting to the point of dehydration  . Tramadol Nausea And Vomiting  . Venofer [Iron Sucrose]   . Buprenorphine Hcl Nausea And Vomiting  . Morphine Nausea And Vomiting    Halllucinations    DISCHARGE MEDICATIONS:   Allergies as of 01/02/2021      Reactions   Morphine And Related Nausea And Vomiting   Strawberry Extract Other (See Comments)   Vomiting to the point of dehydration   Tramadol Nausea And Vomiting   Venofer [iron Sucrose]    Buprenorphine Hcl Nausea And Vomiting   Morphine Nausea And Vomiting   Halllucinations      Medication List    STOP taking these medications   budesonide-formoterol 160-4.5 MCG/ACT inhaler Commonly known as: SYMBICORT   diphenhydrAMINE 25 mg capsule Commonly known as: BENADRYL     TAKE these medications   acetaminophen 325 MG tablet Commonly known as: TYLENOL Take 2 tablets (650 mg total) by mouth every 6 (six) hours as needed for mild pain or headache.   amLODipine 5 MG  tablet Commonly known as: NORVASC Take 5 mg daily by mouth.   aspirin 81 MG chewable tablet Chew 1 tablet (81 mg total) by mouth daily.   docusate sodium 100 MG capsule Commonly known as: COLACE Take 100 mg by mouth 2 (two) times daily.   famotidine 10 MG tablet Commonly known as: PEPCID Take 1 tablet (10 mg total) by mouth 2 (two) times daily for 7 days.   furosemide 40 MG tablet Commonly known as: LASIX Take 40 mg by mouth every morning.   gabapentin 100 MG capsule Commonly known as: NEURONTIN Take 2 capsules (200 mg total) by  mouth 2 (two) times daily. What changed:  how much to take when to take this Another medication with the same name was removed. Continue taking this medication, and follow the directions you see here.   lidocaine 5 % Commonly known as: LIDODERM Place 1 patch onto the skin daily. Remove & Discard patch within 12 hours or as directed by MD   magnesium (amino acid chelate) 133 MG tablet Take 1 tablet by mouth 2 (two) times daily.   metoprolol tartrate 50 MG tablet Commonly known as: LOPRESSOR Take 1 tablet (50 mg total) by mouth 2 (two) times daily. What changed:  medication strength how much to take   mycophenolate 180 MG EC tablet Commonly known as: MYFORTIC Take 180 mg by mouth 2 (two) times daily.   nortriptyline 10 MG capsule Commonly known as: PAMELOR Take 10-20 mg by mouth See admin instructions. Take 1 capsule ('10mg'$ ) by mouth every morning and take 2 capsules ('20mg'$ ) by mouth every night   omeprazole 40 MG capsule Commonly known as: PRILOSEC Take 40 mg by mouth daily.   oseltamivir 30 MG capsule Commonly known as: TAMIFLU Take 1 capsule (30 mg total) by mouth 2 (two) times daily for 4 days.   predniSONE 10 MG tablet Commonly known as: DELTASONE 4 tabs po day1; 3 tabs po day2,3; 2 tabs po day4,5; 1 tab daily afterwards What changed:  medication strength how much to take how to take this when to take this additional instructions   tacrolimus 1 MG capsule Commonly known as: PROGRAF Take 2-3 mg by mouth See admin instructions. Take 3 capsules ('3mg'$ ) by mouth every morning and take 2 capsules ('2mg'$ ) by mouth every night   Trelegy Ellipta 200-62.5-25 MCG/INH Aepb Generic drug: Fluticasone-Umeclidin-Vilant Inhale 1 puff into the lungs daily.   Ventolin HFA 108 (90 Base) MCG/ACT inhaler Generic drug: albuterol Inhale 2 puffs into the lungs every 6 (six) hours as needed for wheezing or shortness of breath.        DISCHARGE INSTRUCTIONS:   Follow-up with PMD 5  days  If you experience worsening of your admission symptoms, develop shortness of breath, life threatening emergency, suicidal or homicidal thoughts you must seek medical attention immediately by calling 911 or calling your MD immediately  if symptoms less severe.  You Must read complete instructions/literature along with all the possible adverse reactions/side effects for all the Medicines you take and that have been prescribed to you. Take any new Medicines after you have completely understood and accept all the possible adverse reactions/side effects.   Please note  You were cared for by a hospitalist during your hospital stay. If you have any questions about your discharge medications or the care you received while you were in the hospital after you are discharged, you can call the unit and asked to speak with the hospitalist on call if the hospitalist  that took care of you is not available. Once you are discharged, your primary care physician will handle any further medical issues. Please note that NO REFILLS for any discharge medications will be authorized once you are discharged, as it is imperative that you return to your primary care physician (or establish a relationship with a primary care physician if you do not have one) for your aftercare needs so that they can reassess your need for medications and monitor your lab values.    Today   CHIEF COMPLAINT:   Chief Complaint  Patient presents with  . Headache  . Fever  . Generalized Body Aches    HISTORY OF PRESENT ILLNESS:  Jasmine Holloway  is a 59 y.o. female came in with headache fever and generalized body aches and found to have influenza positive and acute hypoxic respiratory failure.   VITAL SIGNS:  Blood pressure (!) 135/92, pulse 92, temperature 97.9 F (36.6 C), resp. rate 20, height '5\' 3"'$  (1.6 m), weight 104.3 kg, SpO2 95 %.  I/O:    Intake/Output Summary (Last 24 hours) at 01/02/2021 1721 Last data filed at 01/02/2021  0930 Gross per 24 hour  Intake 850 ml  Output --  Net 850 ml    PHYSICAL EXAMINATION:  GENERAL:  60 y.o.-year-old patient lying in the bed with no acute distress.  EYES: Pupils equal, round, reactive to light and accommodation. No scleral icterus. Extraocular muscles intact.  HEENT: Head atraumatic, normocephalic. Oropharynx and nasopharynx clear.   LUNGS: Decrease breath sounds bilaterally, no wheezing, rales,rhonchi or crepitation. No use of accessory muscles of respiration.  CARDIOVASCULAR: S1, S2 normal. No murmurs, rubs, or gallops.  ABDOMEN: Soft, non-tender, non-distended. Bowel sounds present. No organomegaly or mass.  EXTREMITIES: Trace pedal edema.  NEUROLOGIC: Cranial nerves II through XII are intact. Muscle strength 5/5 in all extremities. Sensation intact. Gait not checked.  PSYCHIATRIC: The patient is alert and oriented x 3.  SKIN: No obvious rash, lesion, or ulcer.   DATA REVIEW:   CBC Recent Labs  Lab 01/02/21 0458  WBC 5.7  HGB 12.1  HCT 38.5  PLT 193    Chemistries  Recent Labs  Lab 01/01/21 1530 01/02/21 0458  NA 134* 132*  K 3.8 4.8  CL 98 99  CO2 26 22  GLUCOSE 100* 121*  BUN 14 16  CREATININE 1.25* 1.39*  CALCIUM 9.1 9.2  AST 18  --   ALT 15  --   ALKPHOS 76  --   BILITOT 0.6  --     Microbiology Results  Results for orders placed or performed during the hospital encounter of 01/01/21  Resp Panel by RT-PCR (Flu A&B, Covid) Nasopharyngeal Swab     Status: Abnormal   Collection Time: 01/01/21  3:18 PM   Specimen: Nasopharyngeal Swab; Nasopharyngeal(NP) swabs in vial transport medium  Result Value Ref Range Status   SARS Coronavirus 2 by RT PCR NEGATIVE NEGATIVE Final    Comment: (NOTE) SARS-CoV-2 target nucleic acids are NOT DETECTED.  The SARS-CoV-2 RNA is generally detectable in upper respiratory specimens during the acute phase of infection. The lowest concentration of SARS-CoV-2 viral copies this assay can detect is 138 copies/mL.  A negative result does not preclude SARS-Cov-2 infection and should not be used as the sole basis for treatment or other patient management decisions. A negative result may occur with  improper specimen collection/handling, submission of specimen other than nasopharyngeal swab, presence of viral mutation(s) within the areas targeted by this assay, and  inadequate number of viral copies(<138 copies/mL). A negative result must be combined with clinical observations, patient history, and epidemiological information. The expected result is Negative.  Fact Sheet for Patients:  EntrepreneurPulse.com.au  Fact Sheet for Healthcare Providers:  IncredibleEmployment.be  This test is no t yet approved or cleared by the [place] FDA and  has been authorized for detection and/or diagnosis of SARS-CoV-2 by FDA under an Emergency Use Authorization (EUA). This EUA will remain  in effect (meaning this test can be used) for the duration of the COVID-19 declaration under Section 564(b)(1) of the Act, 21 U.S.C.section 360bbb-3(b)(1), unless the authorization is terminated  or revoked sooner.       Influenza A by PCR POSITIVE (A) NEGATIVE Final   Influenza B by PCR NEGATIVE NEGATIVE Final    Comment: (NOTE) The Xpert Xpress SARS-CoV-2/FLU/RSV plus assay is intended as an aid in the diagnosis of influenza from Nasopharyngeal swab specimens and should not be used as a sole basis for treatment. Nasal washings and aspirates are unacceptable for Xpert Xpress SARS-CoV-2/FLU/RSV testing.  Fact Sheet for Patients: EntrepreneurPulse.com.au  Fact Sheet for Healthcare Providers: IncredibleEmployment.be  This test is not yet approved or cleared by the [place] FDA and has been authorized for detection and/or diagnosis of SARS-CoV-2 by FDA under an Emergency Use Authorization (EUA). This EUA will remain in effect (meaning this  test can be used) for the duration of the COVID-19 declaration under Section 564(b)(1) of the Act, 21 U.S.C. section 360bbb-3(b)(1), unless the authorization is terminated or revoked.  Performed at [facility], [address] [place], [place] [address]   Blood culture (routine x 2)     Status: None (Preliminary result)   Collection Time: 01/01/21  3:30 PM   Specimen: BLOOD  Result Value Ref Range Status   Specimen Description BLOOD BLOOD LEFT HAND  Final   Special Requests   Final    BOTTLES DRAWN AEROBIC AND ANAEROBIC Blood Culture adequate volume   Culture   Final    NO GROWTH < 24 HOURS Performed at [facility], [address] [place], [place] [address]    Report Status PENDING  Incomplete  Blood culture (routine x 2)     Status: None (Preliminary result)   Collection Time: 01/01/21  3:51 PM   Specimen: BLOOD  Result Value Ref Range Status   Specimen Description BLOOD LEFT ANTECUBITAL  Final   Special Requests   Final    BOTTLES DRAWN AEROBIC AND ANAEROBIC Blood Culture adequate volume   Culture   Final    NO GROWTH < 24 HOURS Performed at [facility], [address] [place], [place] [address]    Report Status PENDING  Incomplete    RADIOLOGY:  DG Chest 2 View  Result Date: 01/01/2021 CLINICAL DATA:  Cough and shortness of breath.  Fever. EXAM: CHEST - 2 VIEW COMPARISON:  Radiograph 04/05/2020. chest CT 06/15/2017 FINDINGS: Mild cardiomegaly is similar to prior exam. Unchanged mediastinal contours. There is interstitial and bronchial thickening, chronic with slight improvement from prior. Previous patchy airspace disease has improved in the interim. Previous fluid in the fissures has improved. No large subpulmonic effusion. No pneumothorax. Surgical clips project over the left upper hemithorax. No acute osseous abnormalities are seen. IMPRESSION: 1. Chronic interstitial and bronchial thickening, less prominent than on prior exam. Suspect  underlying chronic lung disease, emphysema versus interstitial lung disease. 2. Improved patchy bilateral airspace disease from prior exam. 3. Cardiomegaly. Electronically Signed  By: Keith Rake M.D.   On: 01/01/2021 16:20    Management plans discussed with the patient, and she is in agreement.  CODE STATUS:     Code Status Orders  (From admission, onward)         Start     Ordered   01/01/21 1745  Full code  Continuous        01/01/21 1746        Code Status History    Date Active Date Inactive Code Status Order ID Comments User Context   04/05/2020 1655 04/06/2020 1947 Full Code [id number]  Fritzi Mandes, MD ED   11/09/2017 0122 11/10/2017 1600 Full Code [id number]  Amelia Jo, MD Inpatient   09/21/2017 0822 09/22/2017 1855 Full Code [id number]  Hillary Bow, MD ED   09/07/2017 0230 09/07/2017 1908 Full Code [id number]  Lance Coon, MD Inpatient   06/15/2017 1946 06/17/2017 1843 Full Code [id number]  Dustin Flock, MD Inpatient   02/01/2017 0747 02/02/2017 1740 Full Code [id number]  Loletha Grayer, MD ED   11/10/2016 0821 11/11/2016 1754 Full Code [id number]  Hugelmeyer, Alexis, DO Inpatient   07/04/2016 0509 07/07/2016 2031 Full Code [id number]  Mikael Spray, NP ED   06/09/2016 0458 06/10/2016 1932 Full Code [id number]  Awilda Bill, NP ED   05/11/2016 1024 05/12/2016 1826 Full Code [id number]  Vaughan Basta, MD Inpatient   04/28/2016 2030 04/30/2016 1749 Full Code [id number]  Gladstone Lighter, MD Inpatient   03/28/2016 1825 03/29/2016 1726 Full Code [id number]  Vaughan Basta, MD ED   03/17/2016 0606 03/18/2016 0751 Full Code [id number]  Saundra Shelling, MD ED   01/10/2016 2111 01/17/2016 2136 Full Code [id number]  Mikael Spray, NP ED   12/21/2015 0608 12/22/2015 1609 Full Code [id number]  Saundra Shelling, MD Inpatient   02/24/2015 0352 02/24/2015 1615 Full Code [id number]  Harrie Foreman, MD Inpatient   Advance Care Planning Activity    Advance Directive Documentation    Flowsheet Row Most Recent Value  Type of Advance Directive Healthcare Power of Attorney  Pre-existing out of facility DNR order (yellow form or pink MOST form) --  "MOST" Form in Place? --      TOTAL TIME TAKING CARE OF THIS PATIENT: 35 minutes.    Loletha Grayer M.D on 01/02/2021 at 5:21 PM  Between 7am to 6pm - Pager - [phone]  After 6pm go to [email or web address] - password [email or web address]  [facility]  CC: Primary care physician; Talbert Cage, FNP

## 2021-01-02 NOTE — TOC Transition Note (Signed)
Transition of Care [facility]) - CM/SW Discharge Note   Patient Details  Name: Jasmine Holloway MRN: [id number] Date of Birth: January 14, 1961  Transition of Care [facility]) CM/SW Contact:  Pete Pelt, RN Phone Number: 01/02/2021, 1:12 PM   Clinical Narrative:   Patient is being discharged today to home. [facility] will transport patient at 230  With oxygen and a wheelchair as per Denisha.  Patient made aware.           Patient Goals and CMS Choice        Discharge Placement                       Discharge Plan and Services                                     Social Determinants of Health (SDOH) Interventions     Readmission Risk Interventions No flowsheet data found.

## 2021-01-02 NOTE — Discharge Instructions (Signed)
Influenza, Adult Influenza is also called "the flu." It is an infection in the lungs, nose, and throat (respiratory tract). It spreads easily from person to person (is contagious). The flu causes symptoms that are like a cold, along with high fever and body aches. What are the causes? This condition is caused by the influenza virus. You can get the virus by:  Breathing in droplets that are in the air after a person infected with the flu coughed or sneezed.  Touching something that has the virus on it and then touching your mouth, nose, or eyes. What increases the risk? Certain things may make you more likely to get the flu. These include:  Not washing your hands often.  Having close contact with many people during cold and flu season.  Touching your mouth, eyes, or nose without first washing your hands.  Not getting a flu shot every year. You may have a higher risk for the flu, and serious problems, such as a lung infection (pneumonia), if you:  Are older than 65.  Are pregnant.  Have a weakened disease-fighting system (immune system) because of a disease or because you are taking certain medicines.  Have a long-term (chronic) condition, such as: ? Heart, kidney, or lung disease. ? Diabetes. ? Asthma.  Have a liver disorder.  Are very overweight (morbidly obese).  Have anemia. What are the signs or symptoms? Symptoms usually begin suddenly and last 4-14 days. They may include:  Fever and chills.  Headaches, body aches, or muscle aches.  Sore throat.  Cough.  Runny or stuffy (congested) nose.  Feeling discomfort in your chest.  Not wanting to eat as much as normal.  Feeling weak or tired.  Feeling dizzy.  Feeling sick to your stomach or throwing up. How is this treated? If the flu is found early, you can be treated with antiviral medicine. This can help to reduce how bad the illness is and how long it lasts. This may be given by mouth or through an IV  tube. Taking care of yourself at home can help your symptoms get better. Your doctor may want you to:  Take over-the-counter medicines.  Drink plenty of fluids. The flu often goes away on its own. If you have very bad symptoms or other problems, you may be treated in a hospital. Follow these instructions at home: Activity  Rest as needed. Get plenty of sleep.  Stay home from work or school as told by your doctor. ? Do not leave home until you do not have a fever for 24 hours without taking medicine. ? Leave home only to go to your doctor. Eating and drinking  Take an ORS (oral rehydration solution). This is a drink that is sold at pharmacies and stores.  Drink enough fluid to keep your pee pale yellow.  Drink clear fluids in small amounts as you are able. Clear fluids include: ? Water. ? Ice chips. ? Fruit juice mixed with water. ? Low-calorie sports drinks.  Eat bland foods that are easy to digest. Eat small amounts as you are able. These foods include: ? Bananas. ? Applesauce. ? Rice. ? Lean meats. ? Toast. ? Crackers.  Do not eat or drink: ? Fluids that have a lot of sugar or caffeine. ? Alcohol. ? Spicy or fatty foods. General instructions  Take over-the-counter and prescription medicines only as told by your doctor.  Use a cool mist humidifier to add moisture to the air in your home. This can make it   easier for you to breathe. ? When using a cool mist humidifier, clean it daily. Empty water and replace with clean water.  Cover your mouth and nose when you cough or sneeze.  Wash your hands with soap and water often and for at least 20 seconds. This is also important after you cough or sneeze. If you cannot use soap and water, use alcohol-based hand sanitizer.  Keep all follow-up visits.      How is this prevented?  Get a flu shot every year. You may get the flu shot in late summer, fall, or winter. Ask your doctor when you should get your flu shot.  Avoid  contact with people who are sick during fall and winter. This is cold and flu season.   Contact a doctor if:  You get new symptoms.  You have: ? Chest pain. ? Watery poop (diarrhea). ? A fever.  Your cough gets worse.  You start to have more mucus.  You feel sick to your stomach.  You throw up. Get help right away if you:  Have shortness of breath.  Have trouble breathing.  Have skin or nails that turn a bluish color.  Have very bad pain or stiffness in your neck.  Get a sudden headache.  Get sudden pain in your face or ear.  Cannot eat or drink without throwing up. These symptoms may represent a serious problem that is an emergency. Get medical help right away. Call your local emergency services (911 in the [place]  Do not wait to see if the symptoms will go away.  Do not drive yourself to the hospital. Summary  Influenza is also called "the flu." It is an infection in the lungs, nose, and throat. It spreads easily from person to person.  Take over-the-counter and prescription medicines only as told by your doctor.  Getting a flu shot every year is the best way to not get the flu. This information is not intended to replace advice given to you by your health care provider. Make sure you discuss any questions you have with your health care provider. Document Revised: 03/15/2020 Document Reviewed: 03/15/2020 Elsevier Patient Education  2021 Elsevier Inc.  

## 2021-01-02 NOTE — Plan of Care (Signed)
  Problem: Education: Goal: Knowledge of General Education information will improve Description Including pain rating scale, medication(s)/side effects and non-pharmacologic comfort measures Outcome: Progressing   

## 2021-01-06 LAB — CULTURE, BLOOD (ROUTINE X 2)
Culture: NO GROWTH
Culture: NO GROWTH
Special Requests: ADEQUATE
Special Requests: ADEQUATE

## 2021-01-13 DIAGNOSIS — Z940 Kidney transplant status: Principal | ICD-10-CM

## 2021-01-13 DIAGNOSIS — N049 Nephrotic syndrome with unspecified morphologic changes: Principal | ICD-10-CM

## 2021-01-13 DIAGNOSIS — B348 Other viral infections of unspecified site: Principal | ICD-10-CM

## 2021-01-14 DIAGNOSIS — B348 Other viral infections of unspecified site: Principal | ICD-10-CM

## 2021-01-14 DIAGNOSIS — N049 Nephrotic syndrome with unspecified morphologic changes: Principal | ICD-10-CM

## 2021-01-14 DIAGNOSIS — Z940 Kidney transplant status: Principal | ICD-10-CM

## 2021-01-16 ENCOUNTER — Encounter: Admit: 2021-01-16 | Discharge: 2021-01-16 | Payer: MEDICAID

## 2021-01-16 DIAGNOSIS — Z79899 Other long term (current) drug therapy: Principal | ICD-10-CM

## 2021-01-16 DIAGNOSIS — Z940 Kidney transplant status: Principal | ICD-10-CM

## 2021-01-16 DIAGNOSIS — N049 Nephrotic syndrome with unspecified morphologic changes: Principal | ICD-10-CM

## 2021-01-16 DIAGNOSIS — B348 Other viral infections of unspecified site: Principal | ICD-10-CM

## 2021-01-30 ENCOUNTER — Encounter: Admit: 2021-01-30 | Discharge: 2021-01-31 | Payer: MEDICAID

## 2021-01-30 DIAGNOSIS — N049 Nephrotic syndrome with unspecified morphologic changes: Principal | ICD-10-CM

## 2021-01-30 DIAGNOSIS — Z940 Kidney transplant status: Principal | ICD-10-CM

## 2021-01-30 DIAGNOSIS — B348 Other viral infections of unspecified site: Principal | ICD-10-CM

## 2021-02-11 ENCOUNTER — Ambulatory Visit
Admit: 2021-02-11 | Discharge: 2021-02-12 | Payer: 59 | Attending: Student in an Organized Health Care Education/Training Program | Primary: Student in an Organized Health Care Education/Training Program

## 2021-02-11 ENCOUNTER — Ambulatory Visit
Admit: 2021-02-11 | Discharge: 2021-03-12 | Payer: 59 | Attending: Speech-Language Pathologist | Primary: Speech-Language Pathologist

## 2021-03-04 ENCOUNTER — Other Ambulatory Visit
Admission: RE | Admit: 2021-03-04 | Discharge: 2021-03-04 | Disposition: A | Discharge status: Home / Self Care | Payer: Medicaid Other | Source: Ambulatory Visit | Attending: Nephrology | Admitting: Nephrology

## 2021-03-04 ENCOUNTER — Other Ambulatory Visit: Payer: Self-pay

## 2021-03-04 DIAGNOSIS — Z20822 Contact with and (suspected) exposure to covid-19: Secondary | ICD-10-CM | POA: Insufficient documentation

## 2021-03-04 DIAGNOSIS — Z01812 Encounter for preprocedural laboratory examination: Secondary | ICD-10-CM | POA: Diagnosis present

## 2021-03-04 LAB — SARS CORONAVIRUS 2 (TAT 6-24 HRS): SARS Coronavirus 2: NEGATIVE

## 2021-03-06 ENCOUNTER — Ambulatory Visit: Payer: Medicaid Other | Attending: Neurology

## 2021-03-06 DIAGNOSIS — G4733 Obstructive sleep apnea (adult) (pediatric): Secondary | ICD-10-CM | POA: Diagnosis not present

## 2021-03-06 DIAGNOSIS — J9610 Chronic respiratory failure, unspecified whether with hypoxia or hypercapnia: Secondary | ICD-10-CM | POA: Diagnosis present

## 2021-03-07 ENCOUNTER — Other Ambulatory Visit: Payer: Self-pay

## 2021-03-17 ENCOUNTER — Ambulatory Visit: Admit: 2021-03-17 | Payer: 59 | Attending: Speech-Language Pathologist | Primary: Speech-Language Pathologist

## 2021-03-28 ENCOUNTER — Other Ambulatory Visit: Payer: Self-pay

## 2021-03-28 ENCOUNTER — Other Ambulatory Visit
Admission: RE | Admit: 2021-03-28 | Discharge: 2021-03-28 | Disposition: A | Discharge status: Home / Self Care | Payer: Medicaid Other | Source: Ambulatory Visit | Attending: Internal Medicine | Admitting: Internal Medicine

## 2021-03-28 DIAGNOSIS — Z01812 Encounter for preprocedural laboratory examination: Secondary | ICD-10-CM | POA: Insufficient documentation

## 2021-03-28 DIAGNOSIS — Z20822 Contact with and (suspected) exposure to covid-19: Secondary | ICD-10-CM | POA: Diagnosis not present

## 2021-03-28 LAB — SARS CORONAVIRUS 2 (TAT 6-24 HRS): SARS Coronavirus 2: NEGATIVE

## 2021-03-31 DIAGNOSIS — Z940 Kidney transplant status: Principal | ICD-10-CM

## 2021-03-31 DIAGNOSIS — Z79899 Other long term (current) drug therapy: Principal | ICD-10-CM

## 2021-04-01 ENCOUNTER — Ambulatory Visit: Payer: Medicaid Other | Attending: Neurology

## 2021-04-01 DIAGNOSIS — G4733 Obstructive sleep apnea (adult) (pediatric): Secondary | ICD-10-CM | POA: Diagnosis not present

## 2021-04-02 ENCOUNTER — Ambulatory Visit: Admit: 2021-04-02 | Discharge: 2021-04-02 | Payer: 59

## 2021-04-02 ENCOUNTER — Ambulatory Visit: Admit: 2021-04-02 | Discharge: 2021-04-02 | Payer: 59 | Attending: Nephrology | Primary: Nephrology

## 2021-04-02 ENCOUNTER — Other Ambulatory Visit: Payer: Self-pay

## 2021-04-02 DIAGNOSIS — Z79899 Other long term (current) drug therapy: Principal | ICD-10-CM

## 2021-04-02 DIAGNOSIS — Z940 Kidney transplant status: Principal | ICD-10-CM

## 2021-04-14 DIAGNOSIS — U071 COVID-19: Secondary | ICD-10-CM | POA: Insufficient documentation

## 2021-04-15 ENCOUNTER — Ambulatory Visit: Admit: 2021-04-15 | Discharge: 2021-04-16 | Payer: 59

## 2021-04-15 DIAGNOSIS — U071 COVID-19: Principal | ICD-10-CM

## 2021-04-15 DIAGNOSIS — J849 Interstitial pulmonary disease, unspecified: Principal | ICD-10-CM

## 2021-04-15 DIAGNOSIS — D849 Immunodeficiency, unspecified: Principal | ICD-10-CM

## 2021-05-22 ENCOUNTER — Ambulatory Visit: Admit: 2021-05-22 | Payer: 59 | Attending: Speech-Language Pathologist | Primary: Speech-Language Pathologist

## 2021-07-08 DIAGNOSIS — Z79899 Other long term (current) drug therapy: Principal | ICD-10-CM

## 2021-07-08 DIAGNOSIS — Z940 Kidney transplant status: Principal | ICD-10-CM

## 2021-07-09 ENCOUNTER — Ambulatory Visit
Admit: 2021-07-09 | Discharge: 2021-07-09 | Payer: PRIVATE HEALTH INSURANCE | Attending: Nephrology | Primary: Nephrology

## 2021-07-09 ENCOUNTER — Ambulatory Visit: Admit: 2021-07-09 | Discharge: 2021-07-09 | Payer: PRIVATE HEALTH INSURANCE

## 2021-07-09 DIAGNOSIS — Z940 Kidney transplant status: Principal | ICD-10-CM

## 2021-07-09 DIAGNOSIS — Z79899 Other long term (current) drug therapy: Principal | ICD-10-CM

## 2021-08-26 ENCOUNTER — Ambulatory Visit: Admit: 2021-08-26 | Discharge: 2021-08-27 | Payer: 59

## 2021-08-27 ENCOUNTER — Ambulatory Visit: Admit: 2021-08-27 | Discharge: 2021-08-27 | Payer: 59

## 2021-08-27 ENCOUNTER — Ambulatory Visit: Admit: 2021-08-27 | Discharge: 2021-08-27 | Payer: 59 | Attending: Internal Medicine | Primary: Internal Medicine

## 2021-08-27 DIAGNOSIS — R0602 Shortness of breath: Principal | ICD-10-CM

## 2021-08-27 DIAGNOSIS — D849 Immunodeficiency, unspecified: Principal | ICD-10-CM

## 2021-08-27 DIAGNOSIS — R053 Chronic cough: Principal | ICD-10-CM

## 2021-08-27 DIAGNOSIS — J849 Interstitial pulmonary disease, unspecified: Principal | ICD-10-CM

## 2021-08-27 DIAGNOSIS — J449 Chronic obstructive pulmonary disease, unspecified: Principal | ICD-10-CM

## 2021-08-27 DIAGNOSIS — G4739 Other sleep apnea: Principal | ICD-10-CM

## 2021-09-30 DIAGNOSIS — Z79899 Other long term (current) drug therapy: Principal | ICD-10-CM

## 2021-09-30 DIAGNOSIS — Z940 Kidney transplant status: Principal | ICD-10-CM

## 2021-10-01 ENCOUNTER — Ambulatory Visit: Admit: 2021-10-01 | Discharge: 2021-10-01 | Payer: 59

## 2021-10-01 ENCOUNTER — Ambulatory Visit: Admit: 2021-10-01 | Discharge: 2021-10-01 | Payer: 59 | Attending: Nephrology | Primary: Nephrology

## 2021-10-01 DIAGNOSIS — Z79899 Other long term (current) drug therapy: Principal | ICD-10-CM

## 2021-10-01 DIAGNOSIS — Z940 Kidney transplant status: Principal | ICD-10-CM

## 2021-10-21 ENCOUNTER — Other Ambulatory Visit: Payer: Self-pay

## 2021-10-21 ENCOUNTER — Emergency Department
Admission: EM | Admit: 2021-10-21 | Discharge: 2021-10-22 | Disposition: A | Discharge status: Home / Self Care | Payer: Medicaid Other | Attending: Emergency Medicine | Admitting: Emergency Medicine

## 2021-10-21 ENCOUNTER — Emergency Department: Payer: Medicaid Other

## 2021-10-21 DIAGNOSIS — N186 End stage renal disease: Secondary | ICD-10-CM | POA: Insufficient documentation

## 2021-10-21 DIAGNOSIS — I132 Hypertensive heart and chronic kidney disease with heart failure and with stage 5 chronic kidney disease, or end stage renal disease: Secondary | ICD-10-CM | POA: Diagnosis not present

## 2021-10-21 DIAGNOSIS — M7981 Nontraumatic hematoma of soft tissue: Secondary | ICD-10-CM | POA: Diagnosis not present

## 2021-10-21 DIAGNOSIS — Z992 Dependence on renal dialysis: Secondary | ICD-10-CM | POA: Diagnosis not present

## 2021-10-21 DIAGNOSIS — I5030 Unspecified diastolic (congestive) heart failure: Secondary | ICD-10-CM | POA: Diagnosis not present

## 2021-10-21 DIAGNOSIS — J45909 Unspecified asthma, uncomplicated: Secondary | ICD-10-CM | POA: Insufficient documentation

## 2021-10-21 DIAGNOSIS — Z955 Presence of coronary angioplasty implant and graft: Secondary | ICD-10-CM | POA: Insufficient documentation

## 2021-10-21 DIAGNOSIS — E1122 Type 2 diabetes mellitus with diabetic chronic kidney disease: Secondary | ICD-10-CM | POA: Diagnosis not present

## 2021-10-21 DIAGNOSIS — Z940 Kidney transplant status: Secondary | ICD-10-CM | POA: Diagnosis not present

## 2021-10-21 DIAGNOSIS — J449 Chronic obstructive pulmonary disease, unspecified: Secondary | ICD-10-CM | POA: Diagnosis not present

## 2021-10-21 DIAGNOSIS — M7989 Other specified soft tissue disorders: Secondary | ICD-10-CM | POA: Diagnosis present

## 2021-10-21 NOTE — ED Triage Notes (Signed)
Pt presents via POV with complaints of left leg swelling starting 2 days. She notes that she is a right kidney transplant pt at [facility] - who advised her today to be evaluated for the DVT (no hx of DVT - not on blood thinners). Pt ambulatory to triage without difficulty. Denies CP or SOB. ?

## 2021-10-21 NOTE — ED Provider Notes (Signed)
? ?[facility] ?Provider Note ? ? ? Event Date/Time  ? First MD Initiated Contact with Patient 10/21/21 2254   ?  (approximate) ? ? ?History  ? ?Leg Swelling ? ? ?HPI ? ?Jasmine Holloway is a 61 y.o. female with a history of diastolic CHF, renal transplant, diabetes, obesity, COPD who presents for evaluation of left leg pain and swelling.  Patient reports 2 days of progressively worsening left lower extremity pain and swelling.  The swelling is in her calf and the pain is on the lateral aspect of the calf.  She does not remember any trauma although she does seem to have a small bruise in that location.  She also has some swelling of the right lower extremity but that is less then on the left.  No changes on her chronic shortness of breath and no chest pain.  No prior history of PE or DVT, no recent travel immobilization, no exogenous hormones.  Patient was sent here by her transplant coordinator to rule out a DVT. ?  ? ? ?Past Medical History:  ?Diagnosis Date  ? Asthma   ? CHF (congestive heart failure) ([place])   ? COPD (chronic obstructive pulmonary disease) ([place])   ? Diabetes mellitus without complication ([place])   ? Diastolic heart failure ([place])   ? ESRD (end stage renal disease) on dialysis [facility])   ? Hypertension   ? Neuropathy   ? Polycystic kidney disease   ? Renal insufficiency   ? ? ?Past Surgical History:  ?Procedure Laterality Date  ? ABDOMINAL HYSTERECTOMY    ? ABDOMINAL SURGERY    ? ARTERIOVENOUS GRAFT PLACEMENT Right   ? x3 (R forearm currently used for access)  ? BREAST BIOPSY Left 12/03/2015  ? neg  ? BREAST BIOPSY Right 02/2018  ? Neg at [facility]  ? BREAST BIOPSY Left 10/22/2020  ? stereo bx, ribbon clip/ path pending  ? COLON SURGERY    ? ESOPHAGOGASTRODUODENOSCOPY (EGD) WITH PROPOFOL N/A 02/04/2017  ? Procedure: ESOPHAGOGASTRODUODENOSCOPY (EGD) WITH PROPOFOL;  Surgeon: Jonathon Bellows, MD;  Location: [facility] ENDOSCOPY;  Service: Endoscopy;  Laterality: N/A;  ? KIDNEY TRANSPLANT Right 01/08/2018  ?  PERIPHERAL VASCULAR CATHETERIZATION Right 08/04/2016  ? Procedure: A/V Shuntogram;  Surgeon: Algernon Huxley, MD;  Location: [place] [facility];  Service: Cardiovascular;  Laterality: Right;  ? VEIN HARVEST    ? ? ? ?Physical Exam  ? ?Triage Vital Signs: ?ED Triage Vitals  ?Enc Vitals Group  ?   BP 10/21/21 2233 (!) 118/95  ?   Pulse Rate 10/21/21 2233 92  ?   Resp 10/21/21 2233 20  ?   Temp 10/21/21 2233 98.1 ?F (36.7 ?C)  ?   Temp Source 10/21/21 2233 Oral  ?   SpO2 10/21/21 2233 98 %  ?   Weight 10/21/21 2234 217 lb (98.4 kg)  ?   Height 10/21/21 2234 5\' 3"  (1.6 m)  ?   Head Circumference --   ?   Peak Flow --   ?   Pain Score 10/21/21 2233 6  ?   Pain Loc --   ?   Pain Edu? --   ?   Excl. in [place]? --   ? ? ?Most recent vital signs: ?Vitals:  ? 10/21/21 2233  ?BP: (!) 118/95  ?Pulse: 92  ?Resp: 20  ?Temp: 98.1 ?F (36.7 ?C)  ?SpO2: 98%  ? ? ? ?Constitutional: Alert and oriented. Well appearing and in no apparent distress. ?HEENT: ?  Head: Normocephalic and atraumatic.    ?     Eyes: Conjunctivae are normal. Sclera is non-icteric.  ?     Mouth/Throat: Mucous membranes are moist.  ?     Neck: Supple with no signs of meningismus. ?Cardiovascular: Regular rate and rhythm. No murmurs, gallops, or rubs. 2+ symmetrical distal pulses are present in all extremities.  ?Respiratory: Normal respiratory effort. Lungs are clear to auscultation bilaterally.  ?Gastrointestinal: Soft, non tender, and non distended with positive bowel sounds. No rebound or guarding. ?Genitourinary: No CVA tenderness. ?Musculoskeletal: Asymmetric leg swelling with 2+ pitting edema on the left and 1+ pitting edema on the right patient has a small bruise on the lateral aspect of the left calf ?Neurologic: Normal speech and language. Face is symmetric. Moving all extremities. No gross focal neurologic deficits are appreciated. ?Skin: Skin is warm, dry and intact. No rash noted. ?Psychiatric: Mood and affect are normal. Speech and behavior are  normal. ? ?ED Results / Procedures / Treatments  ? ?Labs ?(all labs ordered are listed, but only abnormal results are displayed) ?Labs Reviewed - No data to display ? ? ?EKG ? ?none ? ? ?RADIOLOGY ?I, Rudene Re, attending MD, have personally viewed and interpreted the images obtained during this visit as below: ? ?Doppler ultrasound negative for DVT ? ? ?___________________________________________________ ?Interpretation by Radiologist:  ?US Venous Img Lower Unilateral Left ? ?Result Date: 10/22/2021 ?CLINICAL DATA:  Left leg swelling for 2 days. Pain and edema. Aspirin anticoagulation therapy. EXAM: Left LOWER EXTREMITY VENOUS DOPPLER ULTRASOUND TECHNIQUE: Gray-scale sonography with compression, as well as color and duplex ultrasound, were performed to evaluate the deep venous system(s) from the level of the common femoral vein through the popliteal and proximal calf veins. COMPARISON:  None. FINDINGS: VENOUS Normal compressibility of the common femoral, superficial femoral, and popliteal veins, as well as the visualized calf veins. Visualized portions of profunda femoral vein and great saphenous vein unremarkable. No filling defects to suggest DVT on grayscale or color Doppler imaging. Doppler waveforms show normal direction of venous flow, normal respiratory plasticity and response to augmentation. Limited views of the contralateral common femoral vein are unremarkable. OTHER None. Limitations: none IMPRESSION: No evidence of acute deep venous thrombosis involving the visualized veins of the lower extremities. Electronically Signed   By: Lucienne Capers M.D.   On: 10/22/2021 00:33   ? ? ? ?PROCEDURES: ? ?Critical Care performed: No ? ?Procedures ? ? ? ?IMPRESSION / MDM / ASSESSMENT AND PLAN / ED COURSE  ?I reviewed the triage vital signs and the nursing notes. ? ?61 y.o. female with a history of diastolic CHF, renal transplant, diabetes, obesity, COPD who presents for evaluation of left leg pain and  swelling x 2 days.  With asymmetric leg swelling as described in exam above. ? ?Ddx: DVT versus venous stasis versus edema from CHF versus contusion.  No signs of cellulitis ? ? ?Plan: Venous ultrasound ? ? ?MEDICATIONS GIVEN IN ED: ?Medications - No data to display ? ? ?ED COURSE: Doppler ultrasound negative for DVT.  We discussed elevation of the leg, compression stockings, and close follow-up with her transplant team.  No indication for admission at this time.  Discussed my standard return precautions. ? ? ?Consults: None ? ? ?EMR reviewed including records from last visit with her pulmonologist for interstitial lung disease and her nephrologist for her kidney transplant both from 2023 ? ? ? ?FINAL CLINICAL IMPRESSION(S) / ED DIAGNOSES  ? ?Final diagnoses:  ?Left leg swelling  ? ? ? ?  Rx / DC Orders  ? ?ED Discharge Orders   ? ? None  ? ?  ? ? ? ?Note:  This document was prepared using Dragon voice recognition software and may include unintentional dictation errors. ? ? ?Please note:  Patient was evaluated in Emergency Department today for the symptoms described in the history of present illness. Patient was evaluated in the context of the global COVID-19 pandemic, which necessitated consideration that the patient might be at risk for infection with the SARS-CoV-2 virus that causes COVID-19. Institutional protocols and algorithms that pertain to the evaluation of patients at risk for COVID-19 are in a state of rapid change based on information released by regulatory bodies including the CDC and federal and state organizations. These policies and algorithms were followed during the patient's care in the ED.  Some ED evaluations and interventions may be delayed as a result of limited staffing during the pandemic. ? ? ? ? ?  ?Rudene Re, MD ?10/22/21 [phone] ? ?

## 2021-10-22 NOTE — Discharge Instructions (Signed)
Ultrasound shows no signs of blood clot.  Make sure to keep your legs elevated and use compression stockings.  Follow-up with your transplant team.  Return to the hospital for worsening leg swelling and pain, chest pain or shortness of breath ?

## 2021-10-22 NOTE — ED Notes (Signed)
ED Provider at bedside. 

## 2021-10-28 ENCOUNTER — Ambulatory Visit: Admit: 2021-10-28 | Discharge: 2021-10-29 | Payer: 59 | Attending: Psychiatry | Primary: Psychiatry

## 2021-10-28 DIAGNOSIS — F99 Mental disorder, not otherwise specified: Principal | ICD-10-CM

## 2021-10-28 DIAGNOSIS — Z79899 Other long term (current) drug therapy: Principal | ICD-10-CM

## 2021-10-28 DIAGNOSIS — F39 Unspecified mood [affective] disorder: Principal | ICD-10-CM

## 2021-10-28 MED ORDER — QUETIAPINE 25 MG TABLET
ORAL_TABLET | Freq: Every evening | ORAL | 1 refills | 30 days | Status: CP
Start: 2021-10-28 — End: 2021-11-27

## 2021-11-18 DIAGNOSIS — Z940 Kidney transplant status: Principal | ICD-10-CM

## 2021-12-02 ENCOUNTER — Ambulatory Visit: Admit: 2021-12-02 | Discharge: 2021-12-03 | Payer: 59 | Attending: Psychiatry | Primary: Psychiatry

## 2021-12-08 DIAGNOSIS — Z940 Kidney transplant status: Principal | ICD-10-CM

## 2021-12-08 DIAGNOSIS — Z79899 Other long term (current) drug therapy: Principal | ICD-10-CM

## 2021-12-10 ENCOUNTER — Ambulatory Visit: Admit: 2021-12-10 | Discharge: 2021-12-11 | Payer: 59 | Attending: Nephrology | Primary: Nephrology

## 2021-12-10 ENCOUNTER — Ambulatory Visit: Admit: 2021-12-10 | Discharge: 2021-12-11 | Payer: 59

## 2021-12-10 DIAGNOSIS — Z940 Kidney transplant status: Principal | ICD-10-CM

## 2021-12-10 DIAGNOSIS — Z79899 Other long term (current) drug therapy: Principal | ICD-10-CM

## 2022-01-29 ENCOUNTER — Ambulatory Visit (INDEPENDENT_AMBULATORY_CARE_PROVIDER_SITE_OTHER): Payer: Medicaid Other | Admitting: Gastroenterology

## 2022-01-29 ENCOUNTER — Encounter: Payer: Self-pay | Admitting: Gastroenterology

## 2022-01-29 VITALS — BP 133/84 | HR 79 | Temp 98.3°F | Wt 211.0 lb

## 2022-01-29 DIAGNOSIS — R197 Diarrhea, unspecified: Secondary | ICD-10-CM

## 2022-02-06 ENCOUNTER — Encounter (INDEPENDENT_AMBULATORY_CARE_PROVIDER_SITE_OTHER): Payer: Self-pay | Admitting: Nurse Practitioner

## 2022-02-06 ENCOUNTER — Ambulatory Visit (INDEPENDENT_AMBULATORY_CARE_PROVIDER_SITE_OTHER): Payer: Medicaid Other | Admitting: Nurse Practitioner

## 2022-02-06 VITALS — BP 133/80 | HR 97 | Resp 19 | Ht 63.0 in | Wt 209.0 lb

## 2022-02-06 DIAGNOSIS — N186 End stage renal disease: Secondary | ICD-10-CM | POA: Diagnosis not present

## 2022-02-06 DIAGNOSIS — E1122 Type 2 diabetes mellitus with diabetic chronic kidney disease: Secondary | ICD-10-CM | POA: Diagnosis not present

## 2022-02-06 DIAGNOSIS — Z992 Dependence on renal dialysis: Secondary | ICD-10-CM | POA: Diagnosis not present

## 2022-02-06 DIAGNOSIS — T829XXA Unspecified complication of cardiac and vascular prosthetic device, implant and graft, initial encounter: Secondary | ICD-10-CM

## 2022-02-22 ENCOUNTER — Encounter (INDEPENDENT_AMBULATORY_CARE_PROVIDER_SITE_OTHER): Payer: Self-pay | Admitting: Nurse Practitioner

## 2022-02-22 NOTE — Progress Notes (Signed)
Subjective:    Patient ID: Jasmine Holloway, female    DOB: 09-29-1960, 61 y.o.   MRN: [id number] Chief Complaint  Patient presents with   Establish Care    Referred by Dr Coralie Common    Dnasia Gauna is a 61 year old female that presents today for evaluation of her right upper extremity AV graft.  The patient has not utilized her graft in some time due to a kidney transplant.  However the area has become swollen and tender and there is concern that there may be a possible underlying infection.  Currently the dialysis access has no thrill or bruit.  It is nonfunctional.  There are no open wounds or ulcerations or drainage.    Review of Systems  Skin:  Negative for wound.  All other systems reviewed and are negative.      Objective:   Physical Exam Vitals reviewed.  HENT:     Head: Normocephalic.  Cardiovascular:     Rate and Rhythm: Normal rate.     Pulses:          Radial pulses are 2+ on the right side and 2+ on the left side.  Pulmonary:     Effort: Pulmonary effort is normal.  Skin:    General: Skin is warm and dry.  Neurological:     Mental Status: She is alert and oriented to person, place, and time.  Psychiatric:        Mood and Affect: Mood normal.        Behavior: Behavior normal.        Thought Content: Thought content normal.        Judgment: Judgment normal.     BP 133/80 (BP Location: Left Arm)   Pulse 97   Resp 19   Ht 5\' 3"  (1.6 m)   Wt 209 lb (94.8 kg)   BMI 37.02 kg/m   Past Medical History:  Diagnosis Date   Asthma    CHF (congestive heart failure) (HCC)    COPD (chronic obstructive pulmonary disease) (HCC)    Diabetes mellitus without complication (HCC)    Diastolic heart failure (HCC)    ESRD (end stage renal disease) on dialysis (HCC)    Hypertension    Neuropathy    Polycystic kidney disease    Renal insufficiency     Social History   Socioeconomic History   Marital status: Single    Spouse name: Not on file   Number of children: Not on  file   Years of education: 12   Highest education level: High school graduate  Occupational History   Occupation: disabled  Tobacco Use   Smoking status: Former    Packs/day: 0.25    Types: Cigarettes    Quit date: 08/11/2015    Years since quitting: 6.5   Smokeless tobacco: Never  Vaping Use   Vaping Use: Never used  Substance and Sexual Activity   Alcohol use: No    Alcohol/week: 0.0 standard drinks of alcohol   Drug use: No   Sexual activity: Not on file  Other Topics Concern   Not on file  Social History Narrative   Lives alone   Social Determinants of Health   Financial Resource Strain: High Risk (09/17/2017)   Overall Financial Resource Strain (CARDIA)    Difficulty of Paying Living Expenses: Very hard  Food Insecurity: Food Insecurity Present (09/17/2017)   Hunger Vital Sign    Worried About Running Out of Food in the Last Year: Sometimes true  Ran Out of Food in the Last Year: Sometimes true  Transportation Needs: No Transportation Needs (09/17/2017)   PRAPARE - Hydrologist (Medical): No    Lack of Transportation (Non-Medical): No  Physical Activity: Unknown (09/17/2017)   Exercise Vital Sign    Days of Exercise per Week: 7 days    Minutes of Exercise per Session: Not on file  Stress: No Stress Concern Present (09/17/2017)   [place]    Feeling of Stress : Not at all  Social Connections: Unknown (09/17/2017)   Social Connection and Isolation Panel [NHANES]    Frequency of Communication with Friends and Family: More than three times a week    Frequency of Social Gatherings with Friends and Family: Never    Attends Religious Services: More than 4 times per year    Active Member of [employer] or Organizations: No    Attends Archivist Meetings: Never    Marital Status: Patient refused  Intimate Partner Violence: Unknown (09/17/2017)   Humiliation, Afraid, Rape, and Kick  questionnaire    Fear of Current or Ex-Partner: Patient refused    Emotionally Abused: Patient refused    Physically Abused: Patient refused    Sexually Abused: Patient refused    Past Surgical History:  Procedure Laterality Date   ABDOMINAL HYSTERECTOMY     ABDOMINAL SURGERY     ARTERIOVENOUS GRAFT PLACEMENT Right    x3 (R forearm currently used for access)   BREAST BIOPSY Left 12/03/2015   neg   BREAST BIOPSY Right 02/2018   Neg at [place] Left 10/22/2020   stereo bx, ribbon clip/ path pending   COLON SURGERY     ESOPHAGOGASTRODUODENOSCOPY (EGD) WITH PROPOFOL N/A 02/04/2017   Procedure: ESOPHAGOGASTRODUODENOSCOPY (EGD) WITH PROPOFOL;  Surgeon: Jonathon Bellows, MD;  Location: [facility] ENDOSCOPY;  Service: Endoscopy;  Laterality: N/A;   KIDNEY TRANSPLANT Right 01/08/2018   PERIPHERAL VASCULAR CATHETERIZATION Right 08/04/2016   Procedure: A/V Shuntogram;  Surgeon: Algernon Huxley, MD;  Location: [place] [facility];  Service: Cardiovascular;  Laterality: Right;   VEIN HARVEST      Family History  Problem Relation Age of Onset   Hypertension Brother    Heart failure Brother    Hypertension Mother    Heart disease Mother    Breast cancer Neg Hx     Allergies  Allergen Reactions   Morphine And Related Nausea And Vomiting   Strawberry Extract Other (See Comments)    Vomiting to the point of dehydration   Tramadol Nausea And Vomiting   Venofer [Iron Sucrose]    Buprenorphine Hcl Nausea And Vomiting   Morphine Nausea And Vomiting    Halllucinations       Latest Ref Rng & Units 01/02/2021    4:58 AM 01/01/2021    3:30 PM 04/05/2020   11:31 AM  CBC  WBC 4.0 - 10.5 K/uL 5.7  8.5  11.2   Hemoglobin 12.0 - 15.0 g/dL 12.1  12.4  10.9   Hematocrit 36.0 - 46.0 % 38.5  40.5  33.1   Platelets 150 - 400 K/uL 193  217  333       CMP     Component Value Date/Time   NA 132 (L) 01/02/2021 0458   NA 138 01/05/2014 0434   K 4.8 01/02/2021 0458   K 4.4 01/05/2014 0434   CL  99 01/02/2021 0458   CL 107 01/05/2014  0434   CO2 22 01/02/2021 0458   CO2 27 01/05/2014 0434   GLUCOSE 121 (H) 01/02/2021 0458   GLUCOSE 96 01/05/2014 0434   BUN 16 01/02/2021 0458   BUN 31 (H) 01/05/2014 0434   CREATININE 1.39 (H) 01/02/2021 0458   CREATININE 4.13 (H) 01/05/2014 0434   CALCIUM 9.2 01/02/2021 0458   CALCIUM 8.8 01/05/2014 0434   PROT 8.6 (H) 01/01/2021 1530   PROT 7.9 01/04/2014 0948   ALBUMIN 3.6 01/01/2021 1530   ALBUMIN 2.8 (L) 01/04/2014 0948   AST 18 01/01/2021 1530   AST 29 01/04/2014 0948   ALT 15 01/01/2021 1530   ALT 16 01/04/2014 0948   ALKPHOS 76 01/01/2021 1530   ALKPHOS 78 01/04/2014 0948   BILITOT 0.6 01/01/2021 1530   BILITOT 0.2 01/04/2014 0948   GFRNONAA 43 (L) 01/02/2021 0458   GFRNONAA 11 (L) 01/05/2014 0434   GFRAA 44 (L) 04/05/2020 1131   GFRAA 13 (L) 01/05/2014 0434     No results found.     Assessment & Plan:   1. Complication of arteriovenous dialysis fistula, initial encounter Currently the patient's fistula is nonfunctional.  In this instance because the fistula is occluded and she is no longer utilizing it, it would be prudent to remove the graft as the risk of infection also drains the patient's kidney transplant.  Discussed risk, benefits and alternatives with patient.  She is agreeable to proceed.  We will arrange for surgical removal of her graft and patient will follow-up in office post intervention.  2. Type 2 diabetes mellitus with chronic kidney disease on chronic dialysis, without long-term current use of insulin (HCC) Continue hypoglycemic medications as already ordered, these medications have been reviewed and there are no changes at this time.  Hgb A1C to be monitored as already arranged by primary service    Current Outpatient Medications on File Prior to Visit  Medication Sig Dispense Refill   acetaminophen (TYLENOL) 325 MG tablet Take 2 tablets (650 mg total) by mouth every 6 (six) hours as needed for mild pain  or headache.     ALMACONE DOUBLE STRENGTH 400-400-40 MG/5ML suspension SMARTSIG:2 teaspoon By Mouth Twice Daily PRN     amLODipine (NORVASC) 5 MG tablet Take 5 mg daily by mouth.      ASPIRIN LOW DOSE 81 MG EC tablet Take 81 mg by mouth daily.     Biotin (BIOTIN MAXIMUM STRENGTH) 5 MG CAPS Take 1 tablet by mouth daily.     cetirizine (ZYRTEC) 10 MG tablet Take 1 tablet by mouth daily.     famotidine (PEPCID) 20 MG tablet Take 20 mg by mouth daily as needed.     furosemide (LASIX) 40 MG tablet Take 40 mg by mouth every morning.     hydrOXYzine (ATARAX) 10 MG tablet Take 10 mg by mouth 2 (two) times daily as needed.     ipratropium-albuterol (DUONEB) 0.5-2.5 (3) MG/3ML SOLN Take by mouth.     LORazepam (ATIVAN) 0.5 MG tablet Take 0.5 mg by mouth 2 (two) times daily as needed.     metoprolol tartrate (LOPRESSOR) 25 MG tablet Take by mouth 2 (two) times daily.     mycophenolate (MYFORTIC) 180 MG EC tablet Take 180 mg by mouth 2 (two) times daily.     nortriptyline (PAMELOR) 10 MG capsule Take 10-20 mg by mouth See admin instructions. Take 1 capsule (10mg ) by mouth every morning and take 2 capsules (20mg ) by mouth every night  pantoprazole (PROTONIX) 20 MG tablet Take 20 mg by mouth 2 (two) times daily.     predniSONE (DELTASONE) 5 MG tablet Take 5 mg by mouth daily.     QUEtiapine (SEROQUEL) 25 MG tablet Take 1 tablet by mouth at bedtime.     rosuvastatin (CRESTOR) 5 MG tablet Take 1 tablet by mouth daily.     senna (SENOKOT) 8.6 MG TABS tablet Take 2 tablets by mouth daily.     sodium bicarbonate 650 MG tablet Take 650 mg by mouth 2 (two) times daily.     tacrolimus (PROGRAF) 1 MG capsule Take 2-3 mg by mouth See admin instructions. Take 3 capsules (3mg ) by mouth every morning and take 2 capsules (2mg ) by mouth every night     VENTOLIN HFA 108 (90 Base) MCG/ACT inhaler Inhale 2 puffs into the lungs every 6 (six) hours as needed for wheezing or shortness of breath.      pneumococcal 13-valent  conjugate vaccine (PREVNAR 13) SUSP injection Prevnar 13 (PF) 0.5 mL syringe (Patient not taking: Reported on 02/06/2022)     Specialty Vitamins Products (MAGNESIUM, AMINO ACID CHELATE,) 133 MG tablet Take 1 tablet by mouth 2 (two) times daily. (Patient not taking: Reported on 02/06/2022)     No current facility-administered medications on file prior to visit.    There are no Patient Instructions on file for this visit. No follow-ups on file.   Kris Hartmann, NP

## 2022-02-26 ENCOUNTER — Ambulatory Visit: Admit: 2022-02-26 | Discharge: 2022-02-27 | Payer: 59

## 2022-03-03 ENCOUNTER — Ambulatory Visit: Admit: 2022-03-03 | Discharge: 2022-03-04 | Payer: 59 | Attending: Internal Medicine | Primary: Internal Medicine

## 2022-03-03 DIAGNOSIS — R0602 Shortness of breath: Principal | ICD-10-CM

## 2022-03-03 DIAGNOSIS — J449 Chronic obstructive pulmonary disease, unspecified: Principal | ICD-10-CM

## 2022-03-03 DIAGNOSIS — J849 Interstitial pulmonary disease, unspecified: Principal | ICD-10-CM

## 2022-03-03 DIAGNOSIS — R053 Chronic cough: Principal | ICD-10-CM

## 2022-03-03 DIAGNOSIS — D849 Immunodeficiency, unspecified: Principal | ICD-10-CM

## 2022-03-11 ENCOUNTER — Telehealth (INDEPENDENT_AMBULATORY_CARE_PROVIDER_SITE_OTHER): Payer: Self-pay

## 2022-03-11 NOTE — Telephone Encounter (Signed)
Spoke with the patient and she is scheduled with Dr. Lucky Cowboy for a right forearm graft excison on 03/19/22 at [facility]. Pre-op phone call is on 03/16/22 between 1-5 pm. Pre-surgical instructions were discussed and will be mailed. A copy of the instructions will be mailed to [facility] of the [facility] attention Mariel Sleet per her request.

## 2022-03-16 ENCOUNTER — Encounter
Admission: RE | Admit: 2022-03-16 | Discharge: 2022-03-16 | Disposition: A | Discharge status: Home / Self Care | Payer: Medicaid Other | Source: Ambulatory Visit | Attending: Vascular Surgery | Admitting: Vascular Surgery

## 2022-03-16 ENCOUNTER — Other Ambulatory Visit (INDEPENDENT_AMBULATORY_CARE_PROVIDER_SITE_OTHER): Payer: Self-pay | Admitting: Nurse Practitioner

## 2022-03-16 VITALS — Ht 63.0 in | Wt 208.0 lb

## 2022-03-16 DIAGNOSIS — I10 Essential (primary) hypertension: Secondary | ICD-10-CM

## 2022-03-16 DIAGNOSIS — T829XXA Unspecified complication of cardiac and vascular prosthetic device, implant and graft, initial encounter: Secondary | ICD-10-CM

## 2022-03-16 DIAGNOSIS — N1831 Chronic kidney disease, stage 3a: Secondary | ICD-10-CM

## 2022-03-16 DIAGNOSIS — Z01812 Encounter for preprocedural laboratory examination: Secondary | ICD-10-CM

## 2022-03-16 DIAGNOSIS — Z79899 Other long term (current) drug therapy: Principal | ICD-10-CM

## 2022-03-16 DIAGNOSIS — Z940 Kidney transplant status: Principal | ICD-10-CM

## 2022-03-16 HISTORY — DX: Sleep apnea, unspecified: G47.30

## 2022-03-16 HISTORY — DX: Other specified postprocedural states: Z98.890

## 2022-03-16 HISTORY — DX: Other specified postprocedural states: R11.2

## 2022-03-16 NOTE — Patient Instructions (Addendum)
Your procedure is scheduled on: Thursday, August 10 Report to the Registration Desk on the 1st floor of the [facility]. To find out your arrival time, please call [phone] between 1PM - 3PM on: Wednesday, August 9 If your arrival time is 6:00 am, do not arrive prior to that time as the [facility] entrance doors do not open until 6:00 am.  REMEMBER: Instructions that are not followed completely may result in serious medical risk, up to and including death; or upon the discretion of your surgeon and anesthesiologist your surgery may need to be rescheduled.  Do not eat food after midnight the night before surgery.  No gum chewing, lozengers or hard candies.  You may however, drink water up to 2 hours before you are scheduled to arrive for your surgery. Do not drink anything within 2 hours of your scheduled arrival time.  TAKE THESE MEDICATIONS THE MORNING OF SURGERY WITH A SIP OF WATER:  Amlodipine Ipratropium-albuterol (Duoneb) nebulizer Metoprolol Mycophenolate (Myfortic) Nortriptyline (Pamelor) Pantoprazole (Protonix) Prednisone Rosuvastatin Tacrolimus (Prograf) Trelegy inhaler  One week prior to surgery: starting today, August 7 Stop Anti-inflammatories (NSAIDS) such as Advil, Aleve, Ibuprofen, Motrin, Naproxen, Naprosyn and Aspirin based products such as Excedrin, [place]ys Powder, BC Powder. Stop ANY OVER THE COUNTER supplements until after surgery. You may however, continue to take Tylenol if needed for pain up until the day of surgery.  No Alcohol for 24 hours before or after surgery.  No Smoking including e-cigarettes for 24 hours prior to surgery.  No chewable tobacco products for at least 6 hours prior to surgery.  No nicotine patches on the day of surgery.  Do not use any "recreational" drugs for at least a week prior to your surgery.  Please be advised that the combination of cocaine and anesthesia may have negative outcomes, up to and including death. If you  test positive for cocaine, your surgery will be cancelled.  On the morning of surgery brush your teeth with toothpaste and water, you may rinse your mouth with mouthwash if you wish. Do not swallow any toothpaste or mouthwash.  Use CHG Soap as directed on instruction sheet.  Do not wear jewelry, make-up, hairpins, clips or nail polish.  Do not wear lotions, powders, or perfumes.   Do not shave body from the neck down 48 hours prior to surgery just in case you cut yourself which could leave a site for infection.  Also, freshly shaved skin may become irritated if using the CHG soap.  Contact lenses, hearing aids and dentures may not be worn into surgery.  Do not bring valuables to the hospital. [facility] is not responsible for any missing/lost belongings or valuables.   Bring your C-PAP to the hospital with you in case you may have to spend the night.   Notify your doctor if there is any change in your medical condition (cold, fever, infection).  Wear comfortable clothing (specific to your surgery type) to the hospital.  After surgery, you can help prevent lung complications by doing breathing exercises.  Take deep breaths and cough every 1-2 hours. Your doctor may order a device called an Incentive Spirometer to help you take deep breaths.  If you are being discharged the day of surgery, you will not be allowed to drive home. You will need a responsible adult (18 years or older) to drive you home and stay with you that night.   If you are taking public transportation, you will need to have a responsible  adult (18 years or older) with you. Please confirm with your physician that it is acceptable to use public transportation.   Please call the [facility] at [phone] if you have any questions about these instructions.  Surgery Visitation Policy:  Patients undergoing a surgery or procedure may have two family members or support persons with them as long as  the person is not COVID-19 positive or experiencing its symptoms.   Preparing for Surgery with [place] (CHG) Soap    Before surgery, you can play an important role by reducing the number of germs on your skin.  CHG (Chlorhexidine gluconate) soap is an antiseptic cleanser which kills germs and bonds with the skin to continue killing germs even after washing.  Please do not use if you have an allergy to CHG or antibacterial soaps. If your skin becomes reddened/irritated stop using the CHG.  1. Shower the NIGHT BEFORE SURGERY and the MORNING OF SURGERY with CHG soap.  2. If you choose to wash your hair, wash your hair first as usual with your normal shampoo.  3. After shampooing, rinse your hair and body thoroughly to remove the shampoo.  4. Use CHG as you would any other liquid soap. You can apply CHG directly to the skin and wash gently with a scrungie or a clean washcloth.  5. Apply the CHG soap to your body only from the neck down. Do not use on open wounds or open sores. Avoid contact with your eyes, ears, mouth, and genitals (private parts). Wash face and genitals (private parts) with your normal soap.  6. Wash thoroughly, paying special attention to the area where your surgery will be performed.  7. Thoroughly rinse your body with warm water.  8. Do not shower/wash with your normal soap after using and rinsing off the CHG soap.  9. Pat yourself dry with a clean towel.  10. Wear clean pajamas to bed the night before surgery.  12. Place clean sheets on your bed the night of your first shower and do not sleep with pets.  13. Shower again with the CHG soap on the day of surgery prior to arriving at the hospital.  14. Do not apply any deodorants/lotions/powders.  15. Please wear clean clothes to the hospital.

## 2022-03-17 ENCOUNTER — Encounter: Payer: Self-pay | Admitting: Urgent Care

## 2022-03-17 ENCOUNTER — Encounter: Payer: Self-pay | Admitting: Vascular Surgery

## 2022-03-17 ENCOUNTER — Encounter
Admission: RE | Admit: 2022-03-17 | Discharge: 2022-03-17 | Disposition: A | Discharge status: Home / Self Care | Payer: Medicaid Other | Source: Ambulatory Visit | Attending: Vascular Surgery | Admitting: Vascular Surgery

## 2022-03-17 DIAGNOSIS — Z01812 Encounter for preprocedural laboratory examination: Secondary | ICD-10-CM

## 2022-03-17 DIAGNOSIS — N1831 Chronic kidney disease, stage 3a: Secondary | ICD-10-CM

## 2022-03-17 DIAGNOSIS — Y828 Other medical devices associated with adverse incidents: Secondary | ICD-10-CM | POA: Insufficient documentation

## 2022-03-17 DIAGNOSIS — T829XXA Unspecified complication of cardiac and vascular prosthetic device, implant and graft, initial encounter: Secondary | ICD-10-CM | POA: Diagnosis not present

## 2022-03-17 DIAGNOSIS — I129 Hypertensive chronic kidney disease with stage 1 through stage 4 chronic kidney disease, or unspecified chronic kidney disease: Secondary | ICD-10-CM | POA: Diagnosis not present

## 2022-03-17 DIAGNOSIS — Z01818 Encounter for other preprocedural examination: Secondary | ICD-10-CM | POA: Diagnosis present

## 2022-03-17 DIAGNOSIS — Z01810 Encounter for preprocedural cardiovascular examination: Secondary | ICD-10-CM | POA: Diagnosis not present

## 2022-03-17 DIAGNOSIS — I10 Essential (primary) hypertension: Secondary | ICD-10-CM

## 2022-03-17 LAB — TYPE AND SCREEN
ABO/RH(D): O POS
Antibody Screen: NEGATIVE

## 2022-03-17 LAB — CBC
HCT: 40.3 % (ref 36.0–46.0)
Hemoglobin: 12.3 g/dL (ref 12.0–15.0)
MCH: 27.2 pg (ref 26.0–34.0)
MCHC: 30.5 g/dL (ref 30.0–36.0)
MCV: 89 fL (ref 80.0–100.0)
Platelets: 202 10*3/uL (ref 150–400)
RBC: 4.53 MIL/uL (ref 3.87–5.11)
RDW: 17 % — ABNORMAL HIGH (ref 11.5–15.5)
WBC: 8 10*3/uL (ref 4.0–10.5)
nRBC: 0 % (ref 0.0–0.2)

## 2022-03-17 LAB — BASIC METABOLIC PANEL
Anion gap: 6 (ref 5–15)
BUN: 24 mg/dL — ABNORMAL HIGH (ref 8–23)
CO2: 25 mmol/L (ref 22–32)
Calcium: 9.1 mg/dL (ref 8.9–10.3)
Chloride: 109 mmol/L (ref 98–111)
Creatinine, Ser: 1.51 mg/dL — ABNORMAL HIGH (ref 0.44–1.00)
GFR, Estimated: 39 mL/min — ABNORMAL LOW (ref >60)
Glucose, Bld: 120 mg/dL — ABNORMAL HIGH (ref 70–99)
Potassium: 4.2 mmol/L (ref 3.5–5.1)
Sodium: 140 mmol/L (ref 135–145)

## 2022-03-18 ENCOUNTER — Ambulatory Visit: Admit: 2022-03-18 | Discharge: 2022-03-19 | Payer: 59 | Attending: Nephrology | Primary: Nephrology

## 2022-03-18 ENCOUNTER — Ambulatory Visit: Admit: 2022-03-18 | Discharge: 2022-03-19 | Payer: 59

## 2022-03-18 DIAGNOSIS — E210 Primary hyperparathyroidism: Principal | ICD-10-CM

## 2022-03-18 DIAGNOSIS — Z5181 Encounter for therapeutic drug level monitoring: Principal | ICD-10-CM

## 2022-03-18 DIAGNOSIS — D849 Immunodeficiency, unspecified: Principal | ICD-10-CM

## 2022-03-18 DIAGNOSIS — Z79899 Other long term (current) drug therapy: Principal | ICD-10-CM

## 2022-03-18 DIAGNOSIS — Z940 Kidney transplant status: Principal | ICD-10-CM

## 2022-03-18 DIAGNOSIS — I10 Essential (primary) hypertension: Principal | ICD-10-CM

## 2022-03-18 DIAGNOSIS — D638 Anemia in other chronic diseases classified elsewhere: Principal | ICD-10-CM

## 2022-03-19 ENCOUNTER — Ambulatory Visit: Payer: Medicaid Other | Admitting: Urgent Care

## 2022-03-19 ENCOUNTER — Ambulatory Visit
Admission: RE | Admit: 2022-03-19 | Discharge: 2022-03-19 | Disposition: A | Discharge status: Home / Self Care | Payer: Medicaid Other | Attending: Vascular Surgery | Admitting: Vascular Surgery

## 2022-03-19 ENCOUNTER — Encounter
Admission: RE | Disposition: A | Discharge status: Home / Self Care | Payer: Self-pay | Source: Home / Self Care | Attending: Vascular Surgery

## 2022-03-19 ENCOUNTER — Encounter: Payer: Self-pay | Admitting: Vascular Surgery

## 2022-03-19 ENCOUNTER — Other Ambulatory Visit: Payer: Self-pay

## 2022-03-19 DIAGNOSIS — I509 Heart failure, unspecified: Secondary | ICD-10-CM | POA: Insufficient documentation

## 2022-03-19 DIAGNOSIS — T82511A Breakdown (mechanical) of surgically created arteriovenous shunt, initial encounter: Secondary | ICD-10-CM | POA: Diagnosis present

## 2022-03-19 DIAGNOSIS — Z87891 Personal history of nicotine dependence: Secondary | ICD-10-CM | POA: Diagnosis not present

## 2022-03-19 DIAGNOSIS — Z940 Kidney transplant status: Secondary | ICD-10-CM | POA: Insufficient documentation

## 2022-03-19 DIAGNOSIS — G4733 Obstructive sleep apnea (adult) (pediatric): Secondary | ICD-10-CM | POA: Diagnosis not present

## 2022-03-19 DIAGNOSIS — Y713 Surgical instruments, materials and cardiovascular devices (including sutures) associated with adverse incidents: Secondary | ICD-10-CM | POA: Diagnosis not present

## 2022-03-19 DIAGNOSIS — Y832 Surgical operation with anastomosis, bypass or graft as the cause of abnormal reaction of the patient, or of later complication, without mention of misadventure at the time of the procedure: Secondary | ICD-10-CM | POA: Diagnosis not present

## 2022-03-19 DIAGNOSIS — J449 Chronic obstructive pulmonary disease, unspecified: Secondary | ICD-10-CM | POA: Diagnosis not present

## 2022-03-19 DIAGNOSIS — N186 End stage renal disease: Secondary | ICD-10-CM | POA: Diagnosis not present

## 2022-03-19 DIAGNOSIS — I10 Essential (primary) hypertension: Secondary | ICD-10-CM

## 2022-03-19 DIAGNOSIS — T82898A Other specified complication of vascular prosthetic devices, implants and grafts, initial encounter: Secondary | ICD-10-CM

## 2022-03-19 DIAGNOSIS — E1122 Type 2 diabetes mellitus with diabetic chronic kidney disease: Secondary | ICD-10-CM | POA: Insufficient documentation

## 2022-03-19 DIAGNOSIS — N1831 Chronic kidney disease, stage 3a: Secondary | ICD-10-CM

## 2022-03-19 DIAGNOSIS — Z95828 Presence of other vascular implants and grafts: Secondary | ICD-10-CM | POA: Diagnosis not present

## 2022-03-19 DIAGNOSIS — Z01812 Encounter for preprocedural laboratory examination: Secondary | ICD-10-CM

## 2022-03-19 DIAGNOSIS — Z9981 Dependence on supplemental oxygen: Secondary | ICD-10-CM | POA: Diagnosis not present

## 2022-03-19 DIAGNOSIS — I132 Hypertensive heart and chronic kidney disease with heart failure and with stage 5 chronic kidney disease, or end stage renal disease: Secondary | ICD-10-CM | POA: Insufficient documentation

## 2022-03-19 HISTORY — DX: Localized edema: R60.0

## 2022-03-19 HISTORY — DX: Interstitial pulmonary disease, unspecified: J84.9

## 2022-03-19 HISTORY — DX: Child psychological abuse, confirmed, initial encounter: T74.32XA

## 2022-03-19 HISTORY — DX: Unspecified mood (affective) disorder: F39

## 2022-03-19 HISTORY — DX: Anemia in chronic kidney disease: D63.1

## 2022-03-19 HISTORY — DX: Migraine, unspecified, not intractable, without status migrainosus: G43.909

## 2022-03-19 HISTORY — DX: Sleep disorder, unspecified: G47.9

## 2022-03-19 HISTORY — DX: Personal history of physical and sexual abuse in childhood: Z62.810

## 2022-03-19 HISTORY — DX: Obstructive sleep apnea (adult) (pediatric): G47.33

## 2022-03-19 HISTORY — DX: Personal history of COVID-19: Z86.16

## 2022-03-19 HISTORY — DX: Cocaine abuse, in remission: F14.11

## 2022-03-19 HISTORY — DX: Long term (current) use of unspecified immunomodulators and immunosuppressants: Z79.60

## 2022-03-19 HISTORY — DX: Dysphonia: R49.0

## 2022-03-19 HISTORY — DX: Esophageal obstruction: K22.2

## 2022-03-19 HISTORY — DX: Other viral infections of unspecified site: B34.8

## 2022-03-19 HISTORY — PX: REMOVAL OF GRAFT: SHX6361

## 2022-03-19 HISTORY — DX: Gastro-esophageal reflux disease without esophagitis: K21.9

## 2022-03-19 HISTORY — DX: Anxiety disorder, unspecified: F41.9

## 2022-03-19 LAB — GLUCOSE, CAPILLARY
Glucose-Capillary: 102 mg/dL — ABNORMAL HIGH (ref 70–99)
Glucose-Capillary: 132 mg/dL — ABNORMAL HIGH (ref 70–99)

## 2022-03-19 SURGERY — REMOVAL, GRAFT
Anesthesia: General | Laterality: Right

## 2022-03-19 MED ORDER — LIDOCAINE HCL (CARDIAC) PF 100 MG/5ML IV SOSY
PREFILLED_SYRINGE | INTRAVENOUS | Status: DC | PRN
  Administered 2022-03-19: 100 mg via INTRAVENOUS

## 2022-03-19 MED ORDER — CHLORHEXIDINE GLUCONATE 0.12 % MT SOLN: 15.0000 mL | Freq: Once | OROMUCOSAL | Status: AC

## 2022-03-19 MED ORDER — MIDAZOLAM HCL 2 MG/2ML IJ SOLN
INTRAMUSCULAR | Status: AC
  Filled 2022-03-19: qty 2

## 2022-03-19 MED ORDER — ROCURONIUM BROMIDE 100 MG/10ML IV SOLN
INTRAVENOUS | Status: DC | PRN
  Administered 2022-03-19 (×2): 30 mg via INTRAVENOUS

## 2022-03-19 MED ORDER — SUGAMMADEX SODIUM 200 MG/2ML IV SOLN
INTRAVENOUS | Status: DC | PRN
  Administered 2022-03-19: 200 mg via INTRAVENOUS

## 2022-03-19 MED ORDER — MIDAZOLAM HCL 2 MG/2ML IJ SOLN
INTRAMUSCULAR | Status: DC | PRN
  Administered 2022-03-19: 1 mg via INTRAVENOUS

## 2022-03-19 MED ORDER — HYDROMORPHONE HCL 1 MG/ML IJ SOLN: 1.0000 mg | Freq: Once | INTRAMUSCULAR | Status: DC | PRN

## 2022-03-19 MED ORDER — OXYCODONE HCL 5 MG/5ML PO SOLN: 5.0000 mg | Freq: Once | ORAL | Status: DC | PRN

## 2022-03-19 MED ORDER — HEPARIN SODIUM (PORCINE) 5000 UNIT/ML IJ SOLN
INTRAMUSCULAR | Status: AC
  Filled 2022-03-19: qty 1

## 2022-03-19 MED ORDER — DEXAMETHASONE SODIUM PHOSPHATE 10 MG/ML IJ SOLN
INTRAMUSCULAR | Status: DC | PRN
  Administered 2022-03-19: 5 mg via INTRAVENOUS

## 2022-03-19 MED ORDER — CEFAZOLIN SODIUM-DEXTROSE 2-4 GM/100ML-% IV SOLN
INTRAVENOUS | Status: AC
  Filled 2022-03-19: qty 100

## 2022-03-19 MED ORDER — FENTANYL CITRATE (PF) 100 MCG/2ML IJ SOLN: 25.0000 ug | INTRAMUSCULAR | Status: DC | PRN

## 2022-03-19 MED ORDER — DEXAMETHASONE SODIUM PHOSPHATE 10 MG/ML IJ SOLN
INTRAMUSCULAR | Status: AC
  Filled 2022-03-19: qty 1

## 2022-03-19 MED ORDER — SODIUM CHLORIDE 0.9 % IV SOLN
INTRAVENOUS | Status: DC | PRN
  Administered 2022-03-19: 250 mL

## 2022-03-19 MED ORDER — SODIUM CHLORIDE 0.9 % IV SOLN: INTRAVENOUS | Status: DC

## 2022-03-19 MED ORDER — CEFAZOLIN SODIUM-DEXTROSE 2-4 GM/100ML-% IV SOLN
2.0000 g | INTRAVENOUS | Status: AC
  Administered 2022-03-19: 2 g via INTRAVENOUS

## 2022-03-19 MED ORDER — CHLORHEXIDINE GLUCONATE 0.12 % MT SOLN
OROMUCOSAL | Status: AC
  Administered 2022-03-19: 15 mL via OROMUCOSAL
  Filled 2022-03-19: qty 15

## 2022-03-19 MED ORDER — ONDANSETRON HCL 4 MG/2ML IJ SOLN
INTRAMUSCULAR | Status: AC
  Filled 2022-03-19: qty 2

## 2022-03-19 MED ORDER — ACETAMINOPHEN 10 MG/ML IV SOLN
INTRAVENOUS | Status: AC
  Filled 2022-03-19: qty 100

## 2022-03-19 MED ORDER — FENTANYL CITRATE (PF) 100 MCG/2ML IJ SOLN
INTRAMUSCULAR | Status: AC
  Filled 2022-03-19: qty 2

## 2022-03-19 MED ORDER — FENTANYL CITRATE (PF) 100 MCG/2ML IJ SOLN
INTRAMUSCULAR | Status: DC | PRN
Start: 2022-03-19 — End: 2022-03-19
  Administered 2022-03-19 (×2): 50 ug via INTRAVENOUS

## 2022-03-19 MED ORDER — ACETAMINOPHEN 10 MG/ML IV SOLN
INTRAVENOUS | Status: DC | PRN
  Administered 2022-03-19: 1000 mg via INTRAVENOUS

## 2022-03-19 MED ORDER — ONDANSETRON HCL 4 MG/2ML IJ SOLN: 4.0000 mg | Freq: Four times a day (QID) | INTRAMUSCULAR | Status: DC | PRN

## 2022-03-19 MED ORDER — CHLORHEXIDINE GLUCONATE CLOTH 2 % EX PADS
6.0000 | MEDICATED_PAD | Freq: Once | CUTANEOUS | Status: AC
  Administered 2022-03-19: 6 via TOPICAL

## 2022-03-19 MED ORDER — HYDROCODONE-ACETAMINOPHEN 5-325 MG PO TABS: 2.0000 | ORAL_TABLET | Freq: Four times a day (QID) | ORAL | 0 refills | Status: AC | PRN

## 2022-03-19 MED ORDER — OXYCODONE HCL 5 MG PO TABS: 5.0000 mg | ORAL_TABLET | Freq: Once | ORAL | Status: DC | PRN

## 2022-03-19 MED ORDER — ONDANSETRON HCL 4 MG/2ML IJ SOLN
INTRAMUSCULAR | Status: DC | PRN
  Administered 2022-03-19: 4 mg via INTRAVENOUS

## 2022-03-19 MED ORDER — PROPOFOL 10 MG/ML IV BOLUS
INTRAVENOUS | Status: DC | PRN
  Administered 2022-03-19: 90 mg via INTRAVENOUS

## 2022-03-19 MED ORDER — ORAL CARE MOUTH RINSE: 15.0000 mL | Freq: Once | OROMUCOSAL | Status: AC

## 2022-03-19 MED ORDER — CHLORHEXIDINE GLUCONATE CLOTH 2 % EX PADS: 6.0000 | MEDICATED_PAD | Freq: Once | CUTANEOUS | Status: DC

## 2022-03-19 SURGICAL SUPPLY — 53 items
BAG DECANTER FOR FLEXI CONT (MISCELLANEOUS) ×2 IMPLANT
BLADE SURG SZ11 CARB STEEL (BLADE) ×2 IMPLANT
BOOT SUTURE AID YELLOW STND (SUTURE) ×2 IMPLANT
BRUSH SCRUB EZ  4% CHG (MISCELLANEOUS) ×1
BRUSH SCRUB EZ 4% CHG (MISCELLANEOUS) ×1 IMPLANT
CHLORAPREP W/TINT 26 (MISCELLANEOUS) ×2 IMPLANT
DERMABOND ADVANCED (GAUZE/BANDAGES/DRESSINGS) ×1
DERMABOND ADVANCED .7 DNX12 (GAUZE/BANDAGES/DRESSINGS) ×1 IMPLANT
DRSG TEGADERM 6X8 (GAUZE/BANDAGES/DRESSINGS) ×1 IMPLANT
DRSG TELFA 3X8 NADH (GAUZE/BANDAGES/DRESSINGS) ×2 IMPLANT
ELECT CAUTERY BLADE 6.4 (BLADE) ×2 IMPLANT
ELECT REM PT RETURN 9FT ADLT (ELECTROSURGICAL) ×2
ELECTRODE REM PT RTRN 9FT ADLT (ELECTROSURGICAL) ×1 IMPLANT
GAUZE XEROFORM 1X8 LF (GAUZE/BANDAGES/DRESSINGS) ×2 IMPLANT
GLOVE BIO SURGEON STRL SZ7 (GLOVE) ×2 IMPLANT
GOWN STRL REUS W/ TWL LRG LVL3 (GOWN DISPOSABLE) ×1 IMPLANT
GOWN STRL REUS W/ TWL XL LVL3 (GOWN DISPOSABLE) ×1 IMPLANT
GOWN STRL REUS W/TWL LRG LVL3 (GOWN DISPOSABLE) ×1
GOWN STRL REUS W/TWL XL LVL3 (GOWN DISPOSABLE) ×1
HEMOSTAT SURGICEL 2X3 (HEMOSTASIS) ×2 IMPLANT
IV NS 500ML (IV SOLUTION) ×1
IV NS 500ML BAXH (IV SOLUTION) ×1 IMPLANT
KIT TURNOVER KIT A (KITS) ×2 IMPLANT
LOOP RED MAXI  1X406MM (MISCELLANEOUS) ×1
LOOP VESSEL MAXI 1X406 RED (MISCELLANEOUS) ×1 IMPLANT
LOOP VESSEL MINI 0.8X406 BLUE (MISCELLANEOUS) ×1 IMPLANT
LOOPS BLUE MINI 0.8X406MM (MISCELLANEOUS) ×1
MANIFOLD NEPTUNE II (INSTRUMENTS) ×2 IMPLANT
NDL FILTER BLUNT 18X1 1/2 (NEEDLE) ×1 IMPLANT
NEEDLE FILTER BLUNT 18X 1/2SAF (NEEDLE) ×1
NEEDLE FILTER BLUNT 18X1 1/2 (NEEDLE) ×1 IMPLANT
PACK EXTREMITY ARMC (MISCELLANEOUS) ×2 IMPLANT
PAD DRESSING TELFA 3X8 NADH (GAUZE/BANDAGES/DRESSINGS) IMPLANT
PAD PREP 24X41 OB/GYN DISP (PERSONAL CARE ITEMS) ×2 IMPLANT
SPONGE T-LAP 18X18 ~~LOC~~+RFID (SPONGE) ×1 IMPLANT
STOCKINETTE 48X4 2 PLY STRL (GAUZE/BANDAGES/DRESSINGS) ×1 IMPLANT
STOCKINETTE STRL 4IN 9604848 (GAUZE/BANDAGES/DRESSINGS) ×2 IMPLANT
SUT MNCRL AB 4-0 PS2 18 (SUTURE) ×2 IMPLANT
SUT PROLENE 6 0 BV (SUTURE) ×3 IMPLANT
SUT SILK 0 SH 30 (SUTURE) ×1 IMPLANT
SUT SILK 2 0 (SUTURE) ×1
SUT SILK 2 0 SH (SUTURE) ×1 IMPLANT
SUT SILK 2-0 18XBRD TIE 12 (SUTURE) ×1 IMPLANT
SUT SILK 3 0 (SUTURE) ×1
SUT SILK 3-0 18XBRD TIE 12 (SUTURE) ×1 IMPLANT
SUT SILK 4 0 (SUTURE) ×1
SUT SILK 4-0 18XBRD TIE 12 (SUTURE) ×1 IMPLANT
SUT VIC AB 3-0 SH 27 (SUTURE) ×1
SUT VIC AB 3-0 SH 27X BRD (SUTURE) ×1 IMPLANT
SYR 20ML LL LF (SYRINGE) ×2 IMPLANT
SYR 3ML LL SCALE MARK (SYRINGE) ×2 IMPLANT
TRAP FLUID SMOKE EVACUATOR (MISCELLANEOUS) ×2 IMPLANT
WATER STERILE IRR 500ML POUR (IV SOLUTION) ×2 IMPLANT

## 2022-03-19 NOTE — Discharge Instructions (Signed)
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
## 2022-03-19 NOTE — Anesthesia Postprocedure Evaluation (Signed)
Anesthesia Post Note  Patient: Jasmine Holloway  Procedure(s) Performed: REMOVAL OF GRAFT( FOREARM EXCISON) (Right)  Patient location during evaluation: PACU Anesthesia Type: General Level of consciousness: awake and alert, oriented and patient cooperative Pain management: pain level controlled Vital Signs Assessment: post-procedure vital signs reviewed and stable Respiratory status: spontaneous breathing, nonlabored ventilation and respiratory function stable Cardiovascular status: blood pressure returned to baseline and stable Postop Assessment: adequate PO intake Anesthetic complications: no   No notable events documented.   Last Vitals:  Vitals:   03/19/22 1600 03/19/22 1609  BP: (!) 154/96 136/88  Pulse: 77 72  Resp:  18  Temp: (!) 36 C (!) 35.6 C  SpO2: 94% 95%    Last Pain:  Vitals:   03/19/22 1609  TempSrc: Temporal  PainSc: 0-No pain                 Darrin Nipper

## 2022-03-19 NOTE — Transfer of Care (Addendum)
Immediate Anesthesia Transfer of Care Note  Patient: Jasmine Holloway  Procedure(s) Performed: REMOVAL OF GRAFT( FOREARM EXCISON) (Right)  Patient Location: PACU  Anesthesia Type:General  Level of Consciousness: awake  Airway & Oxygen Therapy: Patient Spontanous Breathing  Post-op Assessment: Report given to RN and Post -op Vital signs reviewed and stable  Post vital signs: Reviewed and stable  Last Vitals:  Vitals Value Taken Time  BP 127/80 03/19/22 1540  Temp 35.9 1540  Pulse 79 03/19/22 1541  Resp 17 03/19/22 1541  SpO2 96 % 03/19/22 1541  Vitals shown include unvalidated device data.  Last Pain: There were no vitals filed for this visit.       Complications: No notable events documented.

## 2022-03-19 NOTE — Op Note (Signed)
[facility]   OPERATIVE NOTE  DATE: 03/19/2022  PRE-OPERATIVE DIAGNOSIS: Right arm AV graft with either infection or pseudoaneurysm no longer being used  POST-OPERATIVE DIAGNOSIS: same as above with no sign of infection  PROCEDURE: 1.   Excision of right arm AV graft  SURGEON: Leotis Pain  ASSISTANT(S): None  ANESTHESIA: general  ESTIMATED BLOOD LOSS: 50 cc  FINDING(S): 1.  The graft was well incorporated without signs of infection.  There was clear deterioration of both the arterial and venous access sites with pseudoaneurysm formation without infection.   INDICATIONS:   Jasmine Holloway is a 61 y.o. female who presents with large puffy areas on her right forearm loop AV graft.  There is concern these could be areas of infection and she is no longer using the graft because she is received a kidney transplant and is no longer on dialysis.  This needs to be removed.  Risks and benefits were discussed and informed consent was obtained.  DESCRIPTION: After obtaining full informed written consent, the patient was brought back to the operating room and placed supine upon the operating table.  The patient was prepped and draped in the standard fashion.  Incisions were made on both the arterial and venous limb of the loop forearm AV graft.  We dissected down to the graft. The graft was well incorporated without signs of infection.  There was clear deterioration of both the arterial and venous access sites with pseudoaneurysm formation without infection.  There was flow in the graft and the graft had to be ligated both proximally and distally on each side with silk suture ligatures.  The graft was then removed leaving short stumps on both the arterial and venous anastomotic sites as they were well incorporated and not infected.  After hemostasis was achieved with electrocautery both wounds were then closed with interrupted 3-0 Vicryl overlying the stumps of the graft, running 3-0  Vicryl for the soft tissue, and interrupted 3 oh nylons for the skin.  Xeroform and sterile dressing were placed. At this point, we elected to complete the procedure.  The patient was taken to the recovery room in stable condition.   COMPLICATIONS: None  CONDITION: Stable  Leotis Pain  03/19/2022, 3:37 PM    This note was created with Dragon Medical transcription system. Any errors in dictation are purely unintentional.
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
## 2022-03-19 NOTE — Anesthesia Procedure Notes (Signed)
Procedure Name: Intubation Date/Time: 03/19/2022 2:35 PM  Performed by: Loletha Grayer, CRNAPre-anesthesia Checklist: Patient identified, Patient being monitored, Timeout performed, Emergency Drugs available and Suction available Patient Re-evaluated:Patient Re-evaluated prior to induction Oxygen Delivery Method: Circle system utilized Preoxygenation: Pre-oxygenation with 100% oxygen Induction Type: IV induction Ventilation: Mask ventilation without difficulty and Oral airway inserted - appropriate to patient size Laryngoscope Size: McGraph and 4 Grade View: Grade II Tube type: Oral Tube size: 7.0 mm Number of attempts: 1 Airway Equipment and Method: Stylet Placement Confirmation: ETT inserted through vocal cords under direct vision, positive ETCO2 and breath sounds checked- equal and bilateral Secured at: 21 cm Tube secured with: Tape Dental Injury: Teeth and Oropharynx as per pre-operative assessment  Comments: Sabra Heck 2 x1 attempt. Arytenoids visualized-unable to pass ETT. McGrath x1 attempt. Mild difficulty passing ETT due to anterior angle.

## 2022-03-20 ENCOUNTER — Telehealth (INDEPENDENT_AMBULATORY_CARE_PROVIDER_SITE_OTHER): Payer: Self-pay

## 2022-03-20 ENCOUNTER — Encounter: Payer: Self-pay | Admitting: Vascular Surgery

## 2022-03-20 NOTE — Telephone Encounter (Signed)
Dr Ovid Curd called concerning pt's surgical site where a vein graft was removed yesterday.  She states that it is not bleeding out but has more blood than normal after a dressing change earlier this morning.  Dr Ovid Curd ask what was placed on site after surgery. I spokw with Arna Medici and she said it was closed with stiches zeroform was placed and a dressing. I told Dr Ovid Curd she verbalized understanding and said she would apply a pressure dressing.  Arna Medici was [place] with this decision.

## 2022-04-02 DIAGNOSIS — Z940 Kidney transplant status: Principal | ICD-10-CM

## 2022-04-03 ENCOUNTER — Ambulatory Visit (INDEPENDENT_AMBULATORY_CARE_PROVIDER_SITE_OTHER): Payer: Medicaid Other | Admitting: Nurse Practitioner

## 2022-04-03 ENCOUNTER — Encounter (INDEPENDENT_AMBULATORY_CARE_PROVIDER_SITE_OTHER): Payer: Self-pay | Admitting: Nurse Practitioner

## 2022-04-03 VITALS — BP 132/90 | HR 76 | Resp 16 | Wt 210.0 lb

## 2022-04-03 DIAGNOSIS — T829XXA Unspecified complication of cardiac and vascular prosthetic device, implant and graft, initial encounter: Secondary | ICD-10-CM

## 2022-04-04 ENCOUNTER — Encounter (INDEPENDENT_AMBULATORY_CARE_PROVIDER_SITE_OTHER): Payer: Self-pay | Admitting: Nurse Practitioner
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
## 2022-04-28 ENCOUNTER — Ambulatory Visit (INDEPENDENT_AMBULATORY_CARE_PROVIDER_SITE_OTHER): Payer: Medicaid Other | Admitting: Nurse Practitioner

## 2022-05-12 ENCOUNTER — Encounter (INDEPENDENT_AMBULATORY_CARE_PROVIDER_SITE_OTHER): Payer: Self-pay | Admitting: Nurse Practitioner

## 2022-05-12 ENCOUNTER — Ambulatory Visit (INDEPENDENT_AMBULATORY_CARE_PROVIDER_SITE_OTHER): Payer: Medicaid Other | Admitting: Nurse Practitioner

## 2022-05-12 VITALS — BP 133/83 | HR 71 | Resp 19 | Ht 63.0 in | Wt 209.0 lb

## 2022-05-12 DIAGNOSIS — T829XXA Unspecified complication of cardiac and vascular prosthetic device, implant and graft, initial encounter: Secondary | ICD-10-CM

## 2022-05-18 ENCOUNTER — Encounter (INDEPENDENT_AMBULATORY_CARE_PROVIDER_SITE_OTHER): Payer: Self-pay | Admitting: Nurse Practitioner
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
## 2022-07-21 DIAGNOSIS — Z79899 Other long term (current) drug therapy: Principal | ICD-10-CM

## 2022-07-21 DIAGNOSIS — Z940 Kidney transplant status: Principal | ICD-10-CM

## 2022-07-29 DIAGNOSIS — Z940 Kidney transplant status: Principal | ICD-10-CM

## 2022-07-29 DIAGNOSIS — Z79899 Other long term (current) drug therapy: Principal | ICD-10-CM

## 2022-08-18 ENCOUNTER — Ambulatory Visit: Admit: 2022-08-18 | Payer: 59

## 2022-09-02 ENCOUNTER — Ambulatory Visit: Admit: 2022-09-02 | Discharge: 2022-09-02 | Payer: 59

## 2022-09-07 ENCOUNTER — Ambulatory Visit: Admit: 2022-09-07 | Discharge: 2022-09-07 | Payer: 59 | Attending: Internal Medicine | Primary: Internal Medicine

## 2022-09-07 ENCOUNTER — Ambulatory Visit: Admit: 2022-09-07 | Discharge: 2022-09-07 | Payer: 59

## 2022-09-07 DIAGNOSIS — R059 Cough, unspecified type: Principal | ICD-10-CM

## 2022-09-07 DIAGNOSIS — R0602 Shortness of breath: Principal | ICD-10-CM

## 2022-09-07 DIAGNOSIS — J849 Interstitial pulmonary disease, unspecified: Principal | ICD-10-CM

## 2022-09-07 DIAGNOSIS — J449 Chronic obstructive pulmonary disease, unspecified: Principal | ICD-10-CM

## 2022-09-07 DIAGNOSIS — R053 Chronic cough: Principal | ICD-10-CM

## 2022-09-07 DIAGNOSIS — G4739 Other sleep apnea: Principal | ICD-10-CM

## 2022-09-07 DIAGNOSIS — D849 Immunodeficiency, unspecified: Principal | ICD-10-CM

## 2022-09-10 ENCOUNTER — Ambulatory Visit: Admit: 2022-09-10 | Discharge: 2022-09-10 | Payer: 59

## 2022-09-29 DIAGNOSIS — Z940 Kidney transplant status: Principal | ICD-10-CM

## 2022-09-29 DIAGNOSIS — Z79899 Other long term (current) drug therapy: Principal | ICD-10-CM

## 2022-09-29 DIAGNOSIS — N1832 Anemia of chronic renal failure, stage 3b (CMS-HCC): Principal | ICD-10-CM

## 2022-09-29 DIAGNOSIS — D631 Anemia in chronic kidney disease: Principal | ICD-10-CM

## 2022-12-09 DIAGNOSIS — Z79899 Other long term (current) drug therapy: Principal | ICD-10-CM

## 2022-12-09 DIAGNOSIS — N1832 Anemia of chronic renal failure, stage 3b (CMS-HCC): Principal | ICD-10-CM

## 2022-12-09 DIAGNOSIS — Z940 Kidney transplant status: Principal | ICD-10-CM

## 2022-12-09 DIAGNOSIS — D631 Anemia in chronic kidney disease: Principal | ICD-10-CM

## 2022-12-10 ENCOUNTER — Ambulatory Visit: Admit: 2022-12-10 | Discharge: 2022-12-11 | Payer: 59 | Attending: Nephrology | Primary: Nephrology

## 2022-12-10 ENCOUNTER — Ambulatory Visit: Admit: 2022-12-10 | Discharge: 2022-12-11 | Payer: 59

## 2022-12-10 DIAGNOSIS — I10 Essential (primary) hypertension: Principal | ICD-10-CM

## 2022-12-10 DIAGNOSIS — Z940 Kidney transplant status: Principal | ICD-10-CM

## 2022-12-10 DIAGNOSIS — Z79899 Other long term (current) drug therapy: Principal | ICD-10-CM

## 2022-12-10 DIAGNOSIS — N1832 Anemia of chronic renal failure, stage 3b (CMS-HCC): Principal | ICD-10-CM

## 2022-12-10 DIAGNOSIS — D631 Anemia in chronic kidney disease: Principal | ICD-10-CM

## 2022-12-10 MED ORDER — FUROSEMIDE 40 MG TABLET
ORAL_TABLET | Freq: Every day | ORAL | 11 refills | 30 days | PRN
Start: 2022-12-10 — End: 2023-12-10

## 2022-12-10 MED ORDER — LOSARTAN 25 MG TABLET
ORAL_TABLET | Freq: Two times a day (BID) | ORAL | 3 refills | 90 days
Start: 2022-12-10 — End: 2023-12-10

## 2022-12-18 MED ORDER — FUROSEMIDE 40 MG TABLET
ORAL_TABLET | Freq: Every day | ORAL | 11 refills | 30 days | Status: CP | PRN
Start: 2022-12-18 — End: 2023-12-18

## 2022-12-18 MED ORDER — LOSARTAN 25 MG TABLET
ORAL_TABLET | Freq: Two times a day (BID) | ORAL | 3 refills | 90 days | Status: CP
Start: 2022-12-18 — End: 2023-12-18

## 2022-12-25 MED ORDER — ERGOCALCIFEROL (VITAMIN D2) 1,250 MCG (50,000 UNIT) CAPSULE
ORAL_CAPSULE | ORAL | 0 refills | 84 days | Status: CP
Start: 2022-12-25 — End: 2023-12-25

## 2022-12-25 MED ORDER — CHOLECALCIFEROL (VITAMIN D3) 50 MCG (2,000 UNIT) TABLET
ORAL_TABLET | Freq: Every day | ORAL | 3 refills | 0 days | Status: CP
Start: 2022-12-25 — End: ?

## 2023-01-06 ENCOUNTER — Ambulatory Visit: Admit: 2023-01-06 | Discharge: 2023-01-06 | Payer: 59

## 2023-01-06 ENCOUNTER — Ambulatory Visit: Admit: 2023-01-06 | Discharge: 2023-01-06 | Payer: 59 | Attending: Internal Medicine | Primary: Internal Medicine

## 2023-03-10 ENCOUNTER — Emergency Department
Admission: EM | Admit: 2023-03-10 | Discharge: 2023-03-11 | Disposition: A | Discharge status: Home / Self Care | Payer: Medicaid Other | Attending: Student in an Organized Health Care Education/Training Program | Admitting: Student in an Organized Health Care Education/Training Program

## 2023-03-10 ENCOUNTER — Other Ambulatory Visit: Payer: Self-pay

## 2023-03-10 DIAGNOSIS — R4182 Altered mental status, unspecified: Secondary | ICD-10-CM | POA: Diagnosis not present

## 2023-03-10 DIAGNOSIS — J9621 Acute and chronic respiratory failure with hypoxia: Secondary | ICD-10-CM | POA: Diagnosis present

## 2023-03-10 DIAGNOSIS — F22 Delusional disorders: Secondary | ICD-10-CM | POA: Insufficient documentation

## 2023-03-10 DIAGNOSIS — J9620 Acute and chronic respiratory failure, unspecified whether with hypoxia or hypercapnia: Secondary | ICD-10-CM | POA: Diagnosis not present

## 2023-03-10 DIAGNOSIS — F309 Manic episode, unspecified: Secondary | ICD-10-CM

## 2023-03-10 DIAGNOSIS — Z91148 Patient's other noncompliance with medication regimen for other reason: Secondary | ICD-10-CM | POA: Diagnosis not present

## 2023-03-10 DIAGNOSIS — E119 Type 2 diabetes mellitus without complications: Secondary | ICD-10-CM | POA: Diagnosis not present

## 2023-03-10 DIAGNOSIS — Z940 Kidney transplant status: Secondary | ICD-10-CM | POA: Diagnosis not present

## 2023-03-10 LAB — URINALYSIS, ROUTINE W REFLEX MICROSCOPIC
Bilirubin Urine: NEGATIVE
Glucose, UA: NEGATIVE mg/dL
Hgb urine dipstick: NEGATIVE
Ketones, ur: NEGATIVE mg/dL
Leukocytes,Ua: NEGATIVE
Nitrite: NEGATIVE
Protein, ur: 100 mg/dL — AB
Specific Gravity, Urine: 1.014 (ref 1.005–1.030)
pH: 6 (ref 5.0–8.0)

## 2023-03-10 LAB — CBC
HCT: 39.4 % (ref 36.0–46.0)
Hemoglobin: 12.4 g/dL (ref 12.0–15.0)
MCH: 27.7 pg (ref 26.0–34.0)
MCHC: 31.5 g/dL (ref 30.0–36.0)
MCV: 88.1 fL (ref 80.0–100.0)
Platelets: 235 10*3/uL (ref 150–400)
RBC: 4.47 MIL/uL (ref 3.87–5.11)
RDW: 17 % — ABNORMAL HIGH (ref 11.5–15.5)
WBC: 8.1 10*3/uL (ref 4.0–10.5)
nRBC: 0 % (ref 0.0–0.2)

## 2023-03-10 LAB — COMPREHENSIVE METABOLIC PANEL
ALT: 16 U/L (ref 0–44)
AST: 16 U/L (ref 15–41)
Albumin: 3.8 g/dL (ref 3.5–5.0)
Alkaline Phosphatase: 79 U/L (ref 38–126)
Anion gap: 7 (ref 5–15)
BUN: 20 mg/dL (ref 8–23)
CO2: 22 mmol/L (ref 22–32)
Calcium: 9.1 mg/dL (ref 8.9–10.3)
Chloride: 106 mmol/L (ref 98–111)
Creatinine, Ser: 1.3 mg/dL — ABNORMAL HIGH (ref 0.44–1.00)
GFR, Estimated: 46 mL/min — ABNORMAL LOW (ref >60)
Glucose, Bld: 112 mg/dL — ABNORMAL HIGH (ref 70–99)
Potassium: 3.8 mmol/L (ref 3.5–5.1)
Sodium: 135 mmol/L (ref 135–145)
Total Bilirubin: 0.3 mg/dL (ref 0.3–1.2)
Total Protein: 8.4 g/dL — ABNORMAL HIGH (ref 6.5–8.1)

## 2023-03-10 LAB — URINE DRUG SCREEN, QUALITATIVE (ARMC ONLY)
Amphetamines, Ur Screen: NOT DETECTED
Barbiturates, Ur Screen: NOT DETECTED
Benzodiazepine, Ur Scrn: NOT DETECTED
Cannabinoid 50 Ng, Ur ~~LOC~~: NOT DETECTED
Cocaine Metabolite,Ur ~~LOC~~: NOT DETECTED
MDMA (Ecstasy)Ur Screen: NOT DETECTED
Methadone Scn, Ur: NOT DETECTED
Opiate, Ur Screen: NOT DETECTED
Phencyclidine (PCP) Ur S: NOT DETECTED
Tricyclic, Ur Screen: POSITIVE — AB

## 2023-03-10 LAB — ETHANOL: Alcohol, Ethyl (B): <10 mg/dL (ref <10)

## 2023-03-10 LAB — SALICYLATE LEVEL: Salicylate Lvl: <7 mg/dL — ABNORMAL LOW (ref 7.0–30.0)

## 2023-03-10 LAB — ACETAMINOPHEN LEVEL: Acetaminophen (Tylenol), Serum: <10 ug/mL — ABNORMAL LOW (ref 10–30)

## 2023-03-10 MED ORDER — LOSARTAN POTASSIUM 50 MG PO TABS
25.0000 mg | ORAL_TABLET | Freq: Two times a day (BID) | ORAL | Status: DC
  Administered 2023-03-10 – 2023-03-11 (×2): 25 mg via ORAL
  Filled 2023-03-10 (×2): qty 1

## 2023-03-10 MED ORDER — SODIUM BICARBONATE 650 MG PO TABS
650.0000 mg | ORAL_TABLET | Freq: Two times a day (BID) | ORAL | Status: DC
  Administered 2023-03-11: 650 mg via ORAL
  Filled 2023-03-10: qty 1

## 2023-03-10 MED ORDER — PANTOPRAZOLE SODIUM 20 MG PO TBEC
20.0000 mg | DELAYED_RELEASE_TABLET | Freq: Two times a day (BID) | ORAL | Status: DC
  Administered 2023-03-11: 20 mg via ORAL
  Filled 2023-03-10: qty 1

## 2023-03-10 MED ORDER — FAMOTIDINE 20 MG PO TABS: 20.0000 mg | ORAL_TABLET | Freq: Every day | ORAL | Status: DC | PRN

## 2023-03-10 MED ORDER — METOPROLOL TARTRATE 25 MG PO TABS
25.0000 mg | ORAL_TABLET | Freq: Two times a day (BID) | ORAL | Status: DC
  Administered 2023-03-10 – 2023-03-11 (×2): 25 mg via ORAL
  Filled 2023-03-10 (×2): qty 1

## 2023-03-10 MED ORDER — NORTRIPTYLINE HCL 10 MG PO CAPS
20.0000 mg | ORAL_CAPSULE | Freq: Every day | ORAL | Status: DC
  Administered 2023-03-10: 20 mg via ORAL
  Filled 2023-03-10: qty 2

## 2023-03-10 MED ORDER — TACROLIMUS 1 MG PO CAPS
3.0000 mg | ORAL_CAPSULE | Freq: Every morning | ORAL | Status: DC
  Administered 2023-03-11: 3 mg via ORAL
  Filled 2023-03-10: qty 3

## 2023-03-10 MED ORDER — NORTRIPTYLINE HCL 10 MG PO CAPS
10.0000 mg | ORAL_CAPSULE | Freq: Every morning | ORAL | Status: DC
  Administered 2023-03-11: 10 mg via ORAL
  Filled 2023-03-10: qty 1

## 2023-03-10 MED ORDER — MYCOPHENOLATE MOFETIL 250 MG PO CAPS
250.0000 mg | ORAL_CAPSULE | Freq: Two times a day (BID) | ORAL | Status: DC
  Administered 2023-03-10 – 2023-03-11 (×2): 250 mg via ORAL
  Filled 2023-03-10 (×2): qty 1

## 2023-03-10 MED ORDER — TACROLIMUS 1 MG PO CAPS
2.0000 mg | ORAL_CAPSULE | Freq: Every day | ORAL | Status: DC
  Administered 2023-03-10: 2 mg via ORAL
  Filled 2023-03-10: qty 2

## 2023-03-10 MED ORDER — QUETIAPINE FUMARATE 25 MG PO TABS
25.0000 mg | ORAL_TABLET | Freq: Every day | ORAL | Status: DC
  Administered 2023-03-10: 25 mg via ORAL
  Filled 2023-03-10: qty 1

## 2023-03-10 MED ORDER — ATORVASTATIN CALCIUM 20 MG PO TABS
20.0000 mg | ORAL_TABLET | Freq: Every day | ORAL | Status: DC
  Administered 2023-03-11: 20 mg via ORAL
  Filled 2023-03-10: qty 1

## 2023-03-10 MED ORDER — SENNA 8.6 MG PO TABS
2.0000 | ORAL_TABLET | Freq: Every day | ORAL | Status: DC
  Administered 2023-03-11: 17.2 mg via ORAL
  Filled 2023-03-10: qty 2

## 2023-03-10 NOTE — ED Notes (Signed)
ivc prior to arrival/psych consult ordered/pending... 

## 2023-03-10 NOTE — ED Notes (Signed)
Pt is conversating with staff while dressing out. Pt makes complete sense. She can tell me all about her home life, her medical HX, recent kidney transplant. She sees her MD every 6 months and was just cleared by them for another 6 months. Pt denies SI/HI.

## 2023-03-10 NOTE — ED Triage Notes (Addendum)
Pt to ED IVC'd via [place] [employer] for psych eval. Per IVC paperwork, ot has not been taking her medication because she thinks she can't take antipsychotics due to kidney issues. Pt rambling in triage, not making sense. Pt paranoid, thinks people are stalking her on social media and tapping her phone. Denies SI, HI, hallucinations. Hx of kidney transplant at [facility]

## 2023-03-10 NOTE — ED Notes (Signed)
Pt dressed out:  Purse Boxes of pills - empty White metal earrings Black help call button Pink metal necklace Blue shirt Blue jeans Blue shoes White socks Cellphone Black leotard Pink underwear 1 curly red wig 1 head hose

## 2023-03-10 NOTE — ED Notes (Signed)
Patient being interviewed by telepsych at this time.

## 2023-03-10 NOTE — ED Notes (Signed)
Pt is laying on the bed, denies a blanket at the current time.

## 2023-03-10 NOTE — ED Provider Notes (Signed)
[facility] Provider Note    Event Date/Time   First MD Initiated Contact with Patient 03/10/23 2146     (approximate)   History   Psychiatric Evaluation  Level V Caveat:  AMS - psychosis  HPI  Jasmine Holloway is a 62 y.o. female history of mood disorder as well as history of kidney transplant presents to the ER under IVC by BPT due to very bizarre delusional and paranoid behavior.  Reportedly has not been taking any of her medications due to concern that it is interacting with her kidney medications.  Patient very poor historian.  Having flight of ideas.  Speaking continuously for extended period of time about a right wide range of subjects.  Seems very paranoid about cyber thieves tapping her phones and throwing rocks in her house.     Physical Exam   Triage Vital Signs: ED Triage Vitals [03/10/23 2112]  Encounter Vitals Group     BP (!) 172/102     Systolic BP Percentile      Diastolic BP Percentile      Pulse Rate 99     Resp (!) 24     Temp 98.1 F (36.7 C)     Temp Source Oral     SpO2      Weight      Height      Head Circumference      Peak Flow      Pain Score 0     Pain Loc      Pain Education      Exclude from Growth Chart     Most recent vital signs: Vitals:   03/10/23 2112  BP: (!) 172/102  Pulse: 99  Resp: (!) 24  Temp: 98.1 F (36.7 C)     Constitutional: Alert  Eyes: Conjunctivae are normal.  Head: Atraumatic. Nose: No congestion/rhinnorhea. Mouth/Throat: Mucous membranes are moist.   Neck: Painless ROM.  Cardiovascular:   Good peripheral circulation. Respiratory: Normal respiratory effort.  No retractions.  Gastrointestinal: Soft and nontender.  Musculoskeletal:  no deformity Neurologic:  MAE spontaneously. No gross focal neurologic deficits are appreciated.  Skin:  Skin is warm, dry and intact. No rash noted. Psychiatric: Odd, delusional, paranoid   ED Results / Procedures / Treatments   Labs (all labs  ordered are listed, but only abnormal results are displayed) Labs Reviewed  COMPREHENSIVE METABOLIC PANEL - Abnormal; Notable for the following components:      Result Value   Glucose, Bld 112 (*)    Creatinine, Ser 1.30 (*)    Total Protein 8.4 (*)    GFR, Estimated 46 (*)    All other components within normal limits  SALICYLATE LEVEL - Abnormal; Notable for the following components:   Salicylate Lvl <7.0 (*)    All other components within normal limits  ACETAMINOPHEN LEVEL - Abnormal; Notable for the following components:   Acetaminophen (Tylenol), Serum <10 (*)    All other components within normal limits  CBC - Abnormal; Notable for the following components:   RDW 17.0 (*)    All other components within normal limits  URINE DRUG SCREEN, QUALITATIVE ([facility] ONLY) - Abnormal; Notable for the following components:   Tricyclic, Ur Screen POSITIVE (*)    All other components within normal limits  ETHANOL  URINALYSIS, ROUTINE W REFLEX MICROSCOPIC     EKG   RADIOLOGY Please see ED Course for my review and interpretation.  I personally reviewed all radiographic images ordered to  evaluate for the above acute complaints and reviewed radiology reports and findings.  These findings were personally discussed with the patient.  Please see medical record for radiology report.    PROCEDURES:  Critical Care performed: No  Procedures   MEDICATIONS ORDERED IN ED: Medications  metoprolol tartrate (LOPRESSOR) tablet 25 mg (has no administration in time range)  nortriptyline (PAMELOR) capsule 10 mg (has no administration in time range)  QUEtiapine (SEROQUEL) tablet 25 mg (has no administration in time range)  senna (SENOKOT) tablet 17.2 mg (has no administration in time range)  tacrolimus (PROGRAF) capsule 2-3 mg (has no administration in time range)  sodium bicarbonate tablet 650 mg (has no administration in time range)  mycophenolate (CELLCEPT) capsule 250 mg (has no administration  in time range)  pantoprazole (PROTONIX) EC tablet 20 mg (has no administration in time range)  famotidine (PEPCID) tablet 20 mg (has no administration in time range)  atorvastatin (LIPITOR) tablet 20 mg (has no administration in time range)  losartan (COZAAR) tablet 25 mg (has no administration in time range)  nortriptyline (PAMELOR) capsule 20 mg (has no administration in time range)     IMPRESSION / MDM / ASSESSMENT AND PLAN / ED COURSE  I reviewed the triage vital signs and the nursing notes.                              Differential diagnosis includes, but is not limited to, Psychosis, delirium, medication effect, noncompliance, polysubstance abuse, Si, Hi, depression  Pt here for evaluation of mania.  Laboratory testing was ordered to evaluation for underlying electrolyte derangement or signs of underlying organic pathology to explain today's presentation.  Based on history and physical and laboratory evaluation, it appears that the patient's presentation is 2/2 underlying psychiatric disorder and will require further evaluation and management by inpatient psychiatry.  IVC continued. Disposition pending psychiatric evaluation.  The patient has been placed in psychiatric observation due to the need to provide a safe environment for the patient while obtaining psychiatric consultation and evaluation, as well as ongoing medical and medication management to treat the patient's condition.  The patient has been placed under full IVC at this time.        FINAL CLINICAL IMPRESSION(S) / ED DIAGNOSES   Final diagnoses:  Mania (HCC)     Rx / DC Orders   ED Discharge Orders     None        Note:  This document was prepared using Dragon voice recognition software and may include unintentional dictation errors.    Willy Eddy, MD 03/10/23 2228

## 2023-03-10 NOTE — ED Notes (Signed)
POC Preg Final Result: NEGATIVE 

## 2023-03-10 NOTE — ED Notes (Addendum)
This RN did Med Rec based on pre-packaged pills that came in with the patient (and is current and UTD) and she confirmed her pills at the request of the pharm tech. MD Roxan Hockey approved.
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
## 2023-03-10 NOTE — ED Notes (Signed)
This RN called pharmacy to have a tech come to do a med rec for pt to be in ED overnight. They advised they do not have a staff to do such.

## 2023-03-11 DIAGNOSIS — F22 Delusional disorders: Secondary | ICD-10-CM | POA: Insufficient documentation
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
## 2023-03-11 NOTE — ED Provider Notes (Signed)
Emergency Medicine Observation Re-evaluation Note  Jasmine Holloway is a 62 y.o. female, seen on rounds today.  Pt initially presented to the ED for complaints of Psychiatric Evaluation Currently, the patient is resting, voices no medical complaints.  Physical Exam  BP (!) 168/96   Pulse 92   Temp 98 F (36.7 C) (Oral)   Resp (!) 22   SpO2 97%  Physical Exam General: Resting in no acute distress Cardiac: No cyanosis Lungs: Equal rise and fall Psych: Not agitated  ED Course / MDM  EKG:   I have reviewed the labs performed to date as well as medications administered while in observation.  Recent changes in the last 24 hours include no events overnight.  Plan  Current plan is for psychiatric disposition.    Irean Hong, MD 03/11/23 [phone]

## 2023-03-11 NOTE — ED Notes (Signed)
Patient was provided with a sandwich tray and apple juice at this time, RN made aware.
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
## 2023-03-11 NOTE — ED Notes (Signed)
Psych at bedside.

## 2023-05-27 ENCOUNTER — Telehealth: Payer: Self-pay

## 2023-05-27 ENCOUNTER — Other Ambulatory Visit: Payer: Self-pay

## 2023-05-27 DIAGNOSIS — Z1211 Encounter for screening for malignant neoplasm of colon: Secondary | ICD-10-CM

## 2023-05-27 MED ORDER — NA SULFATE-K SULFATE-MG SULF 17.5-3.13-1.6 GM/177ML PO SOLN: 1.0000 | Freq: Once | ORAL | 0 refills | Status: AC

## 2023-05-27 NOTE — Telephone Encounter (Signed)
Gastroenterology Pre-Procedure Review  Request Date: 06/23/23 Requesting Physician: Dr. Tobi Bastos  PATIENT REVIEW QUESTIONS: The patient responded to the following health history questions as indicated:    1. Are you having any GI issues? no 2. Do you have a personal history of Polyps? no 3. Do you have a family history of Colon Cancer or Polyps? no 4. Diabetes Mellitus? no 5. Joint replacements in the past 12 months?no 6. Major health problems in the past 3 months? Kidney transplant 7. Any artificial heart valves, MVP, or defibrillator?no    MEDICATIONS & ALLERGIES:    Patient reports the following regarding taking any anticoagulation/antiplatelet therapy:   Plavix, Coumadin, Eliquis, Xarelto, Lovenox, Pradaxa, Brilinta, or Effient? no Aspirin? no  Patient confirms/reports the following medications:  Current Outpatient Medications  Medication Sig Dispense Refill   acetaminophen (TYLENOL) 325 MG tablet Take 2 tablets (650 mg total) by mouth every 6 (six) hours as needed for mild pain or headache.     ALMACONE DOUBLE STRENGTH 400-400-40 MG/5ML suspension SMARTSIG:2 teaspoon By Mouth Twice Daily PRN     amLODipine (NORVASC) 5 MG tablet Take 5 mg daily by mouth.      ASPIRIN LOW DOSE 81 MG EC tablet Take 81 mg by mouth daily.     Biotin (BIOTIN MAXIMUM STRENGTH) 5 MG CAPS Take 1 tablet by mouth daily.     cetirizine (ZYRTEC) 10 MG tablet Take 1 tablet by mouth daily.     famotidine (PEPCID) 20 MG tablet Take 20 mg by mouth daily as needed.     Fluticasone-Umeclidin-Vilant (TRELEGY ELLIPTA) 200-62.5-25 MCG/ACT AEPB Inhale 1 puff into the lungs daily.     furosemide (LASIX) 40 MG tablet Take 40 mg by mouth every morning.     hydrOXYzine (ATARAX) 10 MG tablet Take 10 mg by mouth 2 (two) times daily as needed.     ipratropium-albuterol (DUONEB) 0.5-2.5 (3) MG/3ML SOLN Take 3 mLs by nebulization every 6 (six) hours as needed.     LORazepam (ATIVAN) 0.5 MG tablet Take 0.5 mg by mouth 2 (two)  times daily as needed.     metoprolol tartrate (LOPRESSOR) 25 MG tablet Take by mouth 2 (two) times daily.     mycophenolate (MYFORTIC) 180 MG EC tablet Take 180 mg by mouth 2 (two) times daily.     nortriptyline (PAMELOR) 10 MG capsule Take 10-20 mg by mouth See admin instructions. Take 1 capsule (10mg ) by mouth every morning and take 2 capsules (20mg ) by mouth every night     pantoprazole (PROTONIX) 20 MG tablet Take 20 mg by mouth 2 (two) times daily.     predniSONE (DELTASONE) 5 MG tablet Take 5 mg by mouth daily.     QUEtiapine (SEROQUEL) 25 MG tablet Take 1 tablet by mouth at bedtime.     rosuvastatin (CRESTOR) 5 MG tablet Take 1 tablet by mouth daily.     senna (SENOKOT) 8.6 MG TABS tablet Take 2 tablets by mouth daily.     sodium bicarbonate 650 MG tablet Take 650 mg by mouth 2 (two) times daily.     tacrolimus (PROGRAF) 1 MG capsule Take 2-3 mg by mouth See admin instructions. Take 3 capsules (3mg ) by mouth every morning and take 2 capsules (2mg ) by mouth every night     VENTOLIN HFA 108 (90 Base) MCG/ACT inhaler Inhale 2 puffs into the lungs every 6 (six) hours as needed for wheezing or shortness of breath.      No current facility-administered medications for this  visit.    Patient confirms/reports the following allergies:  Allergies  Allergen Reactions   Morphine Nausea And Vomiting    Halllucinations Halllucinations   Strawberry Extract Other (See Comments) and Rash    Vomiting to the point of dehydration Vomiting to the point of dehydration   Tramadol Nausea And Vomiting   Venofer [Iron Sucrose] Itching   Hydrocodone-Acetaminophen Other (See Comments)   Buprenorphine Hcl Nausea And Vomiting    No orders of the defined types were placed in this encounter.   AUTHORIZATION INFORMATION Primary Insurance: 1D#: Group #:  Secondary Insurance: 1D#: Group #:  SCHEDULE INFORMATION: Date: 06/23/23 Time: Location: [facility]

## 2023-06-15 DIAGNOSIS — Z940 Kidney transplant status: Principal | ICD-10-CM

## 2023-06-15 DIAGNOSIS — Z79899 Other long term (current) drug therapy: Principal | ICD-10-CM

## 2023-06-15 DIAGNOSIS — E559 Vitamin D deficiency, unspecified: Principal | ICD-10-CM

## 2023-06-16 ENCOUNTER — Ambulatory Visit: Admit: 2023-06-16 | Discharge: 2023-06-17 | Payer: 59 | Attending: Nephrology | Primary: Nephrology

## 2023-06-16 ENCOUNTER — Ambulatory Visit: Admit: 2023-06-16 | Discharge: 2023-06-17 | Payer: 59

## 2023-06-16 DIAGNOSIS — Z940 Kidney transplant status: Principal | ICD-10-CM

## 2023-06-16 DIAGNOSIS — I10 Essential (primary) hypertension: Principal | ICD-10-CM

## 2023-06-16 DIAGNOSIS — E559 Vitamin D deficiency, unspecified: Principal | ICD-10-CM

## 2023-06-16 DIAGNOSIS — Z79899 Other long term (current) drug therapy: Principal | ICD-10-CM

## 2023-06-16 MED ORDER — MG-PLUS-PROTEIN 133 MG TABLET
ORAL_TABLET | Freq: Two times a day (BID) | ORAL | 11 refills | 30 days | Status: CP
Start: 2023-06-16 — End: ?

## 2023-06-23 ENCOUNTER — Encounter: Payer: Self-pay | Admitting: Gastroenterology

## 2023-06-23 ENCOUNTER — Ambulatory Visit: Payer: Medicaid Other | Admitting: Anesthesiology

## 2023-06-23 ENCOUNTER — Ambulatory Visit
Admission: RE | Admit: 2023-06-23 | Discharge: 2023-06-23 | Disposition: A | Discharge status: Home / Self Care | Payer: Medicaid Other | Attending: Gastroenterology | Admitting: Gastroenterology

## 2023-06-23 ENCOUNTER — Encounter
Admission: RE | Disposition: A | Discharge status: Home / Self Care | Payer: Self-pay | Source: Home / Self Care | Attending: Gastroenterology

## 2023-06-23 DIAGNOSIS — G4733 Obstructive sleep apnea (adult) (pediatric): Secondary | ICD-10-CM | POA: Diagnosis not present

## 2023-06-23 DIAGNOSIS — D125 Benign neoplasm of sigmoid colon: Secondary | ICD-10-CM | POA: Insufficient documentation

## 2023-06-23 DIAGNOSIS — N186 End stage renal disease: Secondary | ICD-10-CM | POA: Insufficient documentation

## 2023-06-23 DIAGNOSIS — Z940 Kidney transplant status: Secondary | ICD-10-CM | POA: Diagnosis not present

## 2023-06-23 DIAGNOSIS — G43909 Migraine, unspecified, not intractable, without status migrainosus: Secondary | ICD-10-CM | POA: Diagnosis not present

## 2023-06-23 DIAGNOSIS — G479 Sleep disorder, unspecified: Secondary | ICD-10-CM | POA: Insufficient documentation

## 2023-06-23 DIAGNOSIS — J849 Interstitial pulmonary disease, unspecified: Secondary | ICD-10-CM | POA: Insufficient documentation

## 2023-06-23 DIAGNOSIS — K219 Gastro-esophageal reflux disease without esophagitis: Secondary | ICD-10-CM | POA: Insufficient documentation

## 2023-06-23 DIAGNOSIS — I132 Hypertensive heart and chronic kidney disease with heart failure and with stage 5 chronic kidney disease, or end stage renal disease: Secondary | ICD-10-CM | POA: Insufficient documentation

## 2023-06-23 DIAGNOSIS — K562 Volvulus: Secondary | ICD-10-CM | POA: Diagnosis not present

## 2023-06-23 DIAGNOSIS — J449 Chronic obstructive pulmonary disease, unspecified: Secondary | ICD-10-CM | POA: Diagnosis not present

## 2023-06-23 DIAGNOSIS — Z1211 Encounter for screening for malignant neoplasm of colon: Secondary | ICD-10-CM | POA: Diagnosis present

## 2023-06-23 DIAGNOSIS — F419 Anxiety disorder, unspecified: Secondary | ICD-10-CM | POA: Diagnosis not present

## 2023-06-23 DIAGNOSIS — Z992 Dependence on renal dialysis: Secondary | ICD-10-CM | POA: Diagnosis not present

## 2023-06-23 DIAGNOSIS — D126 Benign neoplasm of colon, unspecified: Secondary | ICD-10-CM

## 2023-06-23 DIAGNOSIS — K635 Polyp of colon: Secondary | ICD-10-CM | POA: Diagnosis not present

## 2023-06-23 DIAGNOSIS — Z87891 Personal history of nicotine dependence: Secondary | ICD-10-CM | POA: Diagnosis not present

## 2023-06-23 DIAGNOSIS — I509 Heart failure, unspecified: Secondary | ICD-10-CM | POA: Insufficient documentation

## 2023-06-23 DIAGNOSIS — D631 Anemia in chronic kidney disease: Secondary | ICD-10-CM | POA: Diagnosis not present

## 2023-06-23 DIAGNOSIS — E1122 Type 2 diabetes mellitus with diabetic chronic kidney disease: Secondary | ICD-10-CM | POA: Insufficient documentation

## 2023-06-23 HISTORY — PX: COLONOSCOPY WITH PROPOFOL: SHX5780

## 2023-06-23 HISTORY — PX: POLYPECTOMY: SHX5525

## 2023-06-23 HISTORY — DX: Prediabetes: R73.03

## 2023-06-23 SURGERY — COLONOSCOPY WITH PROPOFOL
Anesthesia: General

## 2023-06-23 MED ORDER — PROPOFOL 10 MG/ML IV BOLUS
INTRAVENOUS | Status: DC | PRN
  Administered 2023-06-23: 20 mg via INTRAVENOUS
  Administered 2023-06-23: 80 mg via INTRAVENOUS
  Administered 2023-06-23: 100 ug/kg/min via INTRAVENOUS

## 2023-06-23 MED ORDER — SODIUM CHLORIDE 0.9 % IV SOLN: INTRAVENOUS | Status: DC
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
## 2023-06-23 NOTE — Transfer of Care (Signed)
Immediate Anesthesia Transfer of Care Note  Patient: Jasmine Holloway  Procedure(s) Performed: COLONOSCOPY WITH PROPOFOL POLYPECTOMY  Patient Location: PACU and Endoscopy Unit  Anesthesia Type:General  Level of Consciousness: drowsy and patient cooperative  Airway & Oxygen Therapy: Patient Spontanous Breathing  Post-op Assessment: Report given to RN and Post -op Vital signs reviewed and stable  Post vital signs: Reviewed and stable  Last Vitals:  Vitals Value Taken Time  BP 120/78 06/23/23 1313  Temp 37 C 06/23/23 1310  Pulse 63 06/23/23 1312  Resp 20 06/23/23 1313  SpO2 99 % 06/23/23 1313  Vitals shown include unfiled device data.  Last Pain:  Vitals:   06/23/23 1310  TempSrc: Temporal  PainSc: Asleep         Complications: No notable events documented.

## 2023-06-23 NOTE — Anesthesia Postprocedure Evaluation (Signed)
Anesthesia Post Note  Patient: Jasmine Holloway  Procedure(s) Performed: COLONOSCOPY WITH PROPOFOL POLYPECTOMY  Patient location during evaluation: Endoscopy Anesthesia Type: General Level of consciousness: awake and alert Pain management: pain level controlled Vital Signs Assessment: post-procedure vital signs reviewed and stable Respiratory status: spontaneous breathing, nonlabored ventilation, respiratory function stable and patient connected to nasal cannula oxygen Cardiovascular status: blood pressure returned to baseline and stable Postop Assessment: no apparent nausea or vomiting Anesthetic complications: no   No notable events documented.   Last Vitals:  Vitals:   06/23/23 1313 06/23/23 1330  BP: 120/78   Pulse:    Resp: 20   Temp:    SpO2: 99% 100%    Last Pain:  Vitals:   06/23/23 1330  TempSrc:   PainSc: 0-No pain                 Cleda Mccreedy Skylene Deremer

## 2023-06-23 NOTE — Op Note (Signed)
[facility] Gastroenterology Patient Name: Jasmine Holloway Procedure Date: 06/23/2023 12:33 PM MRN: [id number] Account #: [id number] Date of Birth: Feb 27, 1961 Admit Type: Outpatient Age: 62 Room: [facility] ENDO ROOM 3 Gender: Female Note Status: Finalized Instrument Name: Prentice Docker [id number] Procedure:             Colonoscopy Indications:           Screening for colorectal malignant neoplasm Providers:             Wyline Mood MD, MD Referring MD:          Delton Prairie FNP, FNP (Referring MD) Medicines:             Monitored Anesthesia Care Complications:         No immediate complications. Procedure:             Pre-Anesthesia Assessment:                        - Prior to the procedure, a History and Physical was                         performed, and patient medications, allergies and                         sensitivities were reviewed. The patient's tolerance                         of previous anesthesia was reviewed.                        - The risks and benefits of the procedure and the                         sedation options and risks were discussed with the                         patient. All questions were answered and informed                         consent was obtained.                        - ASA Grade Assessment: II - A patient with mild                         systemic disease.                        After obtaining informed consent, the colonoscope was                         passed under direct vision. Throughout the procedure,                         the patient's blood pressure, pulse, and oxygen                         saturations were monitored continuously. The                         Colonoscope  was introduced through the anus and                         advanced to the the cecum, identified by the                         appendiceal orifice. The colonoscopy was performed                         with ease. The colonoscopy was technically  difficult                         and complex due to significant looping. The patient                         tolerated the procedure well. The quality of the bowel                         preparation was excellent. The ileocecal valve,                         appendiceal orifice, and rectum were photographed. Findings:      The perianal and digital rectal examinations were normal.      A 5 mm polyp was found in the sigmoid colon. The polyp was sessile. The       polyp was removed with a cold snare. Resection and retrieval were       complete.      The exam was otherwise without abnormality on direct and retroflexion       views. Impression:            - One 5 mm polyp in the sigmoid colon, removed with a                         cold snare. Resected and retrieved.                        - The examination was otherwise normal on direct and                         retroflexion views. Recommendation:        - Discharge patient to home (with escort).                        - Resume previous diet.                        - Continue present medications.                        - Await pathology results.                        - Repeat colonoscopy for surveillance based on                         pathology results. Procedure Code(s):     --- Professional ---  46962, Colonoscopy, flexible; with removal of                         tumor(s), polyp(s), or other lesion(s) by snare                         technique Diagnosis Code(s):     --- Professional ---                        Z12.11, Encounter for screening for malignant neoplasm                         of colon                        D12.5, Benign neoplasm of sigmoid colon CPT copyright 2022 American [employer]. All rights reserved. The codes documented in this report are preliminary and upon coder review may  be revised to meet current compliance requirements. Wyline Mood, MD Wyline Mood MD, MD 06/23/2023  1:07:58 PM This report has been signed electronically. Number of Addenda: 0 Note Initiated On: 06/23/2023 12:33 PM Scope Withdrawal Time: 0 hours 8 minutes 28 seconds  Total Procedure Duration: 0 hours 17 minutes 8 seconds  Estimated Blood Loss:  Estimated blood loss: none.      [facility]
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
## 2023-06-24 ENCOUNTER — Encounter: Payer: Self-pay | Admitting: Gastroenterology

## 2023-06-24 LAB — SURGICAL PATHOLOGY

## 2023-06-28 ENCOUNTER — Encounter: Payer: Self-pay | Admitting: Gastroenterology

## 2023-07-27 DIAGNOSIS — Z940 Kidney transplant status: Principal | ICD-10-CM

## 2023-07-27 DIAGNOSIS — B348 Other viral infections of unspecified site: Principal | ICD-10-CM

## 2024-01-18 DIAGNOSIS — Z940 Kidney transplant status: Principal | ICD-10-CM

## 2024-01-18 DIAGNOSIS — Z79899 Other long term (current) drug therapy: Principal | ICD-10-CM

## 2024-01-18 DIAGNOSIS — E559 Vitamin D deficiency, unspecified: Principal | ICD-10-CM

## 2024-01-18 DIAGNOSIS — D638 Anemia in other chronic diseases classified elsewhere: Principal | ICD-10-CM

## 2024-01-19 ENCOUNTER — Inpatient Hospital Stay: Admit: 2024-01-19 | Discharge: 2024-01-19 | Payer: 59

## 2024-01-19 ENCOUNTER — Ambulatory Visit: Admit: 2024-01-19 | Discharge: 2024-01-19 | Payer: 59 | Attending: Nephrology | Primary: Nephrology

## 2024-01-19 ENCOUNTER — Ambulatory Visit: Admit: 2024-01-19 | Discharge: 2024-01-19 | Payer: 59

## 2024-01-19 DIAGNOSIS — E559 Vitamin D deficiency, unspecified: Principal | ICD-10-CM

## 2024-01-19 DIAGNOSIS — I10 Essential (primary) hypertension: Principal | ICD-10-CM

## 2024-01-19 DIAGNOSIS — Z79899 Other long term (current) drug therapy: Principal | ICD-10-CM

## 2024-01-19 DIAGNOSIS — Z940 Kidney transplant status: Principal | ICD-10-CM

## 2024-01-19 DIAGNOSIS — D638 Anemia in other chronic diseases classified elsewhere: Principal | ICD-10-CM

## 2024-01-19 MED ORDER — LOSARTAN 50 MG TABLET
ORAL_TABLET | Freq: Two times a day (BID) | ORAL | 11 refills | 30.00000 days | Status: CP
Start: 2024-01-19 — End: 2025-01-18

## 2024-01-19 MED ORDER — SEMAGLUTIDE 0.25 MG OR 0.5 MG (2 MG/3 ML) SUBCUTANEOUS PEN INJECTOR
SUBCUTANEOUS | 11 refills | 42.00000 days | Status: CP
Start: 2024-01-19 — End: 2024-03-15

## 2024-03-07 ENCOUNTER — Inpatient Hospital Stay: Admit: 2024-03-07 | Discharge: 2024-03-07 | Payer: 59

## 2024-03-07 DIAGNOSIS — B348 Other viral infections of unspecified site: Principal | ICD-10-CM

## 2024-05-03 DIAGNOSIS — R0602 Shortness of breath: Principal | ICD-10-CM

## 2024-05-12 ENCOUNTER — Inpatient Hospital Stay: Admit: 2024-05-12 | Discharge: 2024-05-12 | Payer: MEDICAID

## 2024-05-15 ENCOUNTER — Ambulatory Visit: Admit: 2024-05-15 | Discharge: 2024-05-16 | Payer: MEDICAID | Attending: Internal Medicine | Primary: Internal Medicine

## 2024-05-15 DIAGNOSIS — R053 Chronic cough: Principal | ICD-10-CM

## 2024-05-15 DIAGNOSIS — R0602 Shortness of breath: Principal | ICD-10-CM

## 2024-05-15 DIAGNOSIS — J849 Interstitial pulmonary disease, unspecified: Principal | ICD-10-CM

## 2024-07-13 DIAGNOSIS — Z79899 Other long term (current) drug therapy: Principal | ICD-10-CM

## 2024-07-13 DIAGNOSIS — Z940 Kidney transplant status: Principal | ICD-10-CM

## 2024-07-18 DIAGNOSIS — Z940 Kidney transplant status: Principal | ICD-10-CM

## 2025-05-15 ENCOUNTER — Ambulatory Visit: Admit: 2025-05-15 | Payer: Medicare (Managed Care) | Attending: Nephrology | Primary: Nephrology
